# Patient Record
Sex: Female | Born: 1965 | Race: White | Hispanic: No | Marital: Married | Smoking: Never smoker
Health system: Southern US, Community
[De-identification: ages and names within clinical notes are randomized; demographics above are authoritative.]

## PROBLEM LIST (undated history)

## (undated) DIAGNOSIS — K760 Fatty (change of) liver, not elsewhere classified: Secondary | ICD-10-CM

## (undated) DIAGNOSIS — K589 Irritable bowel syndrome without diarrhea: Secondary | ICD-10-CM

## (undated) DIAGNOSIS — E669 Obesity, unspecified: Secondary | ICD-10-CM

## (undated) DIAGNOSIS — M1990 Unspecified osteoarthritis, unspecified site: Secondary | ICD-10-CM

## (undated) DIAGNOSIS — F419 Anxiety disorder, unspecified: Secondary | ICD-10-CM

## (undated) DIAGNOSIS — F32A Depression, unspecified: Secondary | ICD-10-CM

## (undated) DIAGNOSIS — Z803 Family history of malignant neoplasm of breast: Secondary | ICD-10-CM

## (undated) DIAGNOSIS — D649 Anemia, unspecified: Secondary | ICD-10-CM

## (undated) DIAGNOSIS — E119 Type 2 diabetes mellitus without complications: Secondary | ICD-10-CM

## (undated) DIAGNOSIS — I10 Essential (primary) hypertension: Secondary | ICD-10-CM

## (undated) DIAGNOSIS — C642 Malignant neoplasm of left kidney, except renal pelvis: Secondary | ICD-10-CM

## (undated) DIAGNOSIS — N186 End stage renal disease: Secondary | ICD-10-CM

## (undated) DIAGNOSIS — IMO0001 Reserved for inherently not codable concepts without codable children: Secondary | ICD-10-CM

## (undated) DIAGNOSIS — I350 Nonrheumatic aortic (valve) stenosis: Secondary | ICD-10-CM

## (undated) DIAGNOSIS — R197 Diarrhea, unspecified: Secondary | ICD-10-CM

## (undated) DIAGNOSIS — R0609 Other forms of dyspnea: Secondary | ICD-10-CM

## (undated) DIAGNOSIS — R0602 Shortness of breath: Secondary | ICD-10-CM

## (undated) DIAGNOSIS — E8770 Fluid overload, unspecified: Secondary | ICD-10-CM

## (undated) DIAGNOSIS — H269 Unspecified cataract: Secondary | ICD-10-CM

## (undated) DIAGNOSIS — Z992 Dependence on renal dialysis: Secondary | ICD-10-CM

## (undated) DIAGNOSIS — Z79899 Other long term (current) drug therapy: Secondary | ICD-10-CM

## (undated) DIAGNOSIS — R6889 Other general symptoms and signs: Secondary | ICD-10-CM

## (undated) DIAGNOSIS — J189 Pneumonia, unspecified organism: Secondary | ICD-10-CM

## (undated) DIAGNOSIS — H3581 Retinal edema: Secondary | ICD-10-CM

## (undated) DIAGNOSIS — Z87448 Personal history of other diseases of urinary system: Secondary | ICD-10-CM

## (undated) DIAGNOSIS — G8929 Other chronic pain: Secondary | ICD-10-CM

## (undated) DIAGNOSIS — R609 Edema, unspecified: Secondary | ICD-10-CM

## (undated) DIAGNOSIS — G629 Polyneuropathy, unspecified: Secondary | ICD-10-CM

## (undated) DIAGNOSIS — R600 Localized edema: Secondary | ICD-10-CM

## (undated) DIAGNOSIS — F410 Panic disorder [episodic paroxysmal anxiety] without agoraphobia: Secondary | ICD-10-CM

## (undated) DIAGNOSIS — C801 Malignant (primary) neoplasm, unspecified: Secondary | ICD-10-CM

## (undated) DIAGNOSIS — R51 Headache: Secondary | ICD-10-CM

## (undated) DIAGNOSIS — E785 Hyperlipidemia, unspecified: Secondary | ICD-10-CM

## (undated) DIAGNOSIS — E0590 Thyrotoxicosis, unspecified without thyrotoxic crisis or storm: Secondary | ICD-10-CM

## (undated) DIAGNOSIS — K5900 Constipation, unspecified: Secondary | ICD-10-CM

## (undated) DIAGNOSIS — E213 Hyperparathyroidism, unspecified: Secondary | ICD-10-CM

## (undated) DIAGNOSIS — Z973 Presence of spectacles and contact lenses: Secondary | ICD-10-CM

## (undated) DIAGNOSIS — I519 Heart disease, unspecified: Secondary | ICD-10-CM

## (undated) DIAGNOSIS — F329 Major depressive disorder, single episode, unspecified: Secondary | ICD-10-CM

## (undated) DIAGNOSIS — R0600 Dyspnea, unspecified: Secondary | ICD-10-CM

## (undated) DIAGNOSIS — D369 Benign neoplasm, unspecified site: Secondary | ICD-10-CM

## (undated) DIAGNOSIS — L0390 Cellulitis, unspecified: Secondary | ICD-10-CM

## (undated) HISTORY — DX: Depression, unspecified: F32.A

## (undated) HISTORY — PX: BREAST BIOPSY: SHX20

## (undated) HISTORY — DX: Anxiety disorder, unspecified: F41.9

## (undated) HISTORY — DX: Anemia, unspecified: D64.9

## (undated) HISTORY — DX: Family history of malignant neoplasm of breast: Z80.3

## (undated) HISTORY — PX: ABDOMINAL HYSTERECTOMY: SHX81

## (undated) HISTORY — DX: Fatty (change of) liver, not elsewhere classified: K76.0

## (undated) HISTORY — DX: Unspecified osteoarthritis, unspecified site: M19.90

## (undated) HISTORY — DX: Irritable bowel syndrome, unspecified: K58.9

## (undated) HISTORY — DX: Obesity, unspecified: E66.9

## (undated) HISTORY — DX: Type 2 diabetes mellitus without complications: E11.9

## (undated) HISTORY — PX: AV FISTULA PLACEMENT: SHX1204

## (undated) HISTORY — DX: Cellulitis, unspecified: L03.90

## (undated) HISTORY — PX: ABDOMINAL SURGERY: SHX537

## (undated) HISTORY — PX: CHOLECYSTECTOMY: SHX55

## (undated) HISTORY — DX: Reserved for inherently not codable concepts without codable children: IMO0001

## (undated) HISTORY — PX: TUBAL LIGATION: SHX77

## (undated) HISTORY — PX: EYE SURGERY: SHX253

## (undated) HISTORY — DX: Irritable bowel syndrome without diarrhea: K58.9

## (undated) HISTORY — PX: PELVIC LAPAROSCOPY: SHX162

## (undated) HISTORY — DX: Malignant (primary) neoplasm, unspecified: C80.1

## (undated) HISTORY — PX: HX CATARACT REMOVAL: SHX102

## (undated) HISTORY — PX: HYMENECTOMY: SHX987

## (undated) HISTORY — PX: KIDNEY SURGERY: SHX687

---
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
## 1994-09-25 HISTORY — PX: HYMENECTOMY: SHX987

## 1996-09-10 HISTORY — PX: HX GALL BLADDER SURGERY/CHOLE: SHX55

## 1996-09-11 HISTORY — PX: GALLBLADDER SURGERY: SHX652

## 1996-09-11 HISTORY — PX: CHOLECYSTECTOMY: SHX55

## 1997-09-25 HISTORY — PX: ECTOPIC PREGNANCY SURGERY: SHX613

## 1998-05-24 HISTORY — PX: ECTOPIC PREGNANCY SURGERY: SHX613

## 1998-05-24 HISTORY — PX: HX PELVIC LAPAROSCOPY: SHX162

## 1998-05-25 HISTORY — PX: ECTOPIC PREGNANCY SURGERY: SHX613

## 2012-03-07 ENCOUNTER — Ambulatory Visit
Admission: RE | Admit: 2012-03-07 | Disposition: A | Discharge status: Home / Self Care | Payer: Self-pay | Source: Ambulatory Visit

## 2012-09-02 ENCOUNTER — Encounter (HOSPITAL_BASED_OUTPATIENT_CLINIC_OR_DEPARTMENT_OTHER): Payer: Self-pay

## 2012-09-02 ENCOUNTER — Ambulatory Visit (HOSPITAL_BASED_OUTPATIENT_CLINIC_OR_DEPARTMENT_OTHER)
Admission: RE | Admit: 2012-09-02 | Discharge: 2012-09-02 | Disposition: A | Discharge status: Home / Self Care | Payer: MEDICAID | Source: Ambulatory Visit | Attending: Obstetrics & Gynecology | Admitting: Obstetrics & Gynecology

## 2012-09-02 DIAGNOSIS — Z01812 Encounter for preprocedural laboratory examination: Secondary | ICD-10-CM | POA: Insufficient documentation

## 2012-09-02 DIAGNOSIS — Z302 Encounter for sterilization: Secondary | ICD-10-CM | POA: Insufficient documentation

## 2012-09-02 HISTORY — DX: Anxiety disorder, unspecified: F41.9

## 2012-09-02 HISTORY — DX: Irritable bowel syndrome, unspecified: K58.9

## 2012-09-02 HISTORY — DX: Fatty (change of) liver, not elsewhere classified: K76.0

## 2012-09-02 HISTORY — DX: Depression, unspecified: F32.A

## 2012-09-02 HISTORY — DX: Unspecified osteoarthritis, unspecified site: M19.90

## 2012-09-02 HISTORY — DX: Obesity, unspecified: E66.9

## 2012-09-02 LAB — URINALYSIS (ROUTINE) - BMC ONLY
BILIRUBIN,URINE: NEGATIVE mg/dL
GLUCOSE, URINE: >1000 mg/dL — AB
KETONES,URINE: NEGATIVE mg/dL
NITRITES,URINE: NEGATIVE
PH,URINE: 5 (ref <8.0)
SPECIFIC GRAVITY,URINE: 1.02 (ref <1.022)
UROBILINOGEN, URINE: <2 mg/dL (ref <2.0)

## 2012-09-02 LAB — CBC
BASOPHIL #: 0.02 10*3/uL (ref 0.00–0.10)
BASOPHILS %: 0.3 % (ref 0.0–1.4)
EOSINOPHIL #: 0.03 10*3/uL (ref 0.00–0.50)
EOSINOPHIL %: 0.4 % (ref 0.0–5.2)
HCT: 46.3 % — ABNORMAL HIGH (ref 36.0–45.0)
HGB: 16 g/dL — ABNORMAL HIGH (ref 12.0–15.5)
LYMPHOCYTE #: 1.87 10*3/uL (ref 0.70–3.20)
LYMPHOCYTE %: 27.5 % (ref 15.0–43.0)
MCH: 30.5 pg (ref 28.0–34.0)
MCHC: 34.6 g/dL (ref 33.0–37.0)
MCV: 88.1 fL (ref 82.0–97.0)
MONOCYTE #: 0.32 10*3/uL (ref 0.20–0.90)
MONOCYTE %: 4.7 % — ABNORMAL LOW (ref 4.8–12.0)
MPV: 9.3 fL (ref 7.0–9.4)
PLATELET COUNT: 221 10*3/uL (ref 150–400)
PMN #: 4.55 K/uL (ref 1.50–6.50)
PMN %: 67 % (ref 43.0–76.0)
RBC: 5.26 M/uL — ABNORMAL HIGH (ref 4.00–5.10)
RDW: 11.6 % (ref 11.0–13.0)
WBC: 6.8 K/uL (ref 4.0–11.0)

## 2012-09-10 ENCOUNTER — Encounter (HOSPITAL_BASED_OUTPATIENT_CLINIC_OR_DEPARTMENT_OTHER): Payer: Self-pay

## 2012-09-10 ENCOUNTER — Encounter (HOSPITAL_BASED_OUTPATIENT_CLINIC_OR_DEPARTMENT_OTHER)
Admission: RE | Disposition: A | Discharge status: Home / Self Care | Payer: Self-pay | Source: Ambulatory Visit | Attending: Obstetrics & Gynecology

## 2012-09-10 ENCOUNTER — Inpatient Hospital Stay (HOSPITAL_BASED_OUTPATIENT_CLINIC_OR_DEPARTMENT_OTHER)
Admission: RE | Admit: 2012-09-10 | Discharge: 2012-09-10 | Disposition: A | Discharge status: Home / Self Care | Payer: MEDICAID | Source: Ambulatory Visit | Attending: Obstetrics & Gynecology | Admitting: Obstetrics & Gynecology

## 2012-09-10 DIAGNOSIS — K589 Irritable bowel syndrome without diarrhea: Secondary | ICD-10-CM | POA: Insufficient documentation

## 2012-09-10 DIAGNOSIS — F3289 Other specified depressive episodes: Secondary | ICD-10-CM | POA: Insufficient documentation

## 2012-09-10 DIAGNOSIS — F411 Generalized anxiety disorder: Secondary | ICD-10-CM | POA: Insufficient documentation

## 2012-09-10 DIAGNOSIS — K7689 Other specified diseases of liver: Secondary | ICD-10-CM | POA: Insufficient documentation

## 2012-09-10 DIAGNOSIS — E669 Obesity, unspecified: Secondary | ICD-10-CM | POA: Insufficient documentation

## 2012-09-10 HISTORY — PX: TUBAL LIGATION: SHX77

## 2012-09-10 HISTORY — PX: HX TUBAL LIGATION: SHX77

## 2012-09-10 SURGERY — LAPAROSCOPIC OCCLUSION TUBAL
Anesthesia: General | Site: Abdomen | Laterality: Bilateral | Wound class: Clean Contaminated Wounds-The respiratory, GI, Genital, or urinary

## 2012-09-10 MED ORDER — ONDANSETRON HCL (PF) 4 MG/2 ML INJECTION SOLUTION
4.0000 mg | Freq: Once | INTRAMUSCULAR | Status: DC | PRN
Start: 2012-09-10 — End: 2012-09-10

## 2012-09-10 MED ORDER — HYDROMORPHONE 2 MG/ML INJECTION SYRINGE
0.5000 mg | INJECTION | INTRAMUSCULAR | Status: AC | PRN
Start: 2012-09-10 — End: 2012-09-10
  Administered 2012-09-10 (×2): 0.5 mg via INTRAVENOUS
  Filled 2012-09-10: qty 1

## 2012-09-10 MED ORDER — LACTATED RINGERS INTRAVENOUS SOLUTION
INTRAVENOUS | Status: DC
Start: 2012-09-10 — End: 2012-09-10

## 2012-09-10 MED ORDER — ALBUTEROL SULFATE CONCENTRATE 2.5 MG/0.5 ML SOLUTION FOR NEBULIZATION
5.0000 mg | INHALATION_SOLUTION | Freq: Once | RESPIRATORY_TRACT | Status: DC | PRN
Start: 2012-09-10 — End: 2012-09-10

## 2012-09-10 MED ORDER — IPRATROPIUM BROMIDE 0.02 % SOLUTION FOR INHALATION
0.5000 mg | Freq: Once | RESPIRATORY_TRACT | Status: DC | PRN
Start: 2012-09-10 — End: 2012-09-10

## 2012-09-10 MED ORDER — TERCONAZOLE 0.4 % VAGINAL CREAM
TOPICAL_CREAM | VAGINAL | Status: DC
Start: 2012-09-10 — End: 2013-01-22

## 2012-09-10 MED ORDER — ACETAMINOPHEN 1,000 MG/100 ML (10 MG/ML) INTRAVENOUS SOLUTION
1000.0000 mg | Freq: Once | INTRAVENOUS | Status: AC
Start: 2012-09-10 — End: 2012-09-10
  Administered 2012-09-10: 1000 mg via INTRAVENOUS
  Filled 2012-09-10: qty 1

## 2012-09-10 MED ORDER — IBUPROFEN 800 MG TABLET
800.0000 mg | ORAL_TABLET | Freq: Three times a day (TID) | ORAL | Status: DC | PRN
Start: 2012-09-10 — End: 2014-10-27

## 2012-09-10 MED ORDER — MIDAZOLAM 1 MG/ML INJECTION SOLUTION
0.5000 mg | INTRAMUSCULAR | Status: DC | PRN
Start: 2012-09-10 — End: 2012-09-10

## 2012-09-10 MED ORDER — HYDROCODONE 5 MG-ACETAMINOPHEN 325 MG TABLET
1.0000 | ORAL_TABLET | ORAL | Status: DC | PRN
Start: 2012-09-10 — End: 2013-01-22

## 2012-09-10 SURGICAL SUPPLY — 18 items
CATH URETH DOVER RBNL 16FR 16IN 2 STGR DRAIN EYE RND CLS TIP INT FNL CONN PVC STRL LF  DISP (UROLOGICAL SUPPLIES) ×1 IMPLANT
CLIP SM INTERNAL FLSH SIL TI SFT LINE UPR JAW FLLPN TUBE STRL TBL LGT SYS LF (Clip) ×2 IMPLANT
CONV USE ITEM 156524 - ADHESIVE TISSUE EXOFIN 1.0ML_PREMIERPRO EXOFIN (SEALANTS) ×1 IMPLANT
CONV USE ITEM 321837 - GLOVE SURG 7.5 LTX 3 PLY PF BE_AD CUF SMTH STRL BRN TINT (GLOVES AND ACCESSORIES) ×1 IMPLANT
CONV USE ITEM 338068 - KIT SURG SM STUP NONST DISP LF (CUSTOM TRAYS & PACK) ×1 IMPLANT
CONV USE ITEM 58611 - NEEDLE INSFL 120MM 14GA EPTH ULTRAVERS SPRG LD STOPCOCK LL CONN BLUNT STY STRL DISP PNMPRTN CYTO SS (ENDOSCOPIC SUPPLIES) ×1 IMPLANT
GLOVE SURG TRIFLEX LATEX 8.0 2D7255 40PR/BX STRL PWD (GLOVES AND ACCESSORIES) ×1 IMPLANT
GOWN SURG XL AAMI L4 IMPRV REI NF BRTHBL STRL LF DISP CNVRT (PROTECTIVE PRODUCTS/GARMENTS) ×2 IMPLANT
KIT SURG SM STUP NONST DISP LF (CUSTOM TRAYS & PACK) ×1
PACK LAPAROSCOPY 29245 CS/8 (PROTECTIVE PRODUCTS/GARMENTS) ×1 IMPLANT
SOL ANFG DFGR ISOPRPNL PAD OVAL BTL NABRSV ADH STRL LF  DISP (KITS & TRAYS (DISPOSABLE)) ×1 IMPLANT
SUTURE 3-0 SH VICRYL 27IN VIOL BRD COAT ABS (SUTURE/WOUND CLOSURE) ×1 IMPLANT
SUTURE 4-0 PS-4 VICRYL MTPS 18IN UNDYED BRD COAT ABS (SUTURE/WOUND CLOSURE) ×1 IMPLANT
TRAY SKIN SCRUB 8IN VNYL COTTON 6 WNG 6 SPONGE STICK 2 TIP APPL DRY STRL LF (KITS & TRAYS (DISPOSABLE)) ×2 IMPLANT
TROCAR LAPSCP STD 100MM 11MM VERSAONE FIX CANN BLADE STRL LF  DISP (ENDOSCOPIC SUPPLIES) ×1 IMPLANT
TROCAR LAPSCP STD 11MM VRSTP + RADIAL EXPD SLEEVE CANN DIL STRL LF  DISP (ENDOSCOPIC SUPPLIES) ×1 IMPLANT
TUBE PNEUMO HEATED BX/10_0620040690 (Connecting Tubes/Misc) ×1
TUBING INSFL PNEUMOSURE THRMPLST 12.52X5.83IN C19.84LB 150VA 18.7IN 50104F HEAT HI FLOW RATE SET (Connecting Tubes/Misc) ×1 IMPLANT

## 2012-09-10 NOTE — OR Surgeon (Addendum)
[facility]  [place], [place] [address]    Post-Operative Note    Patient Name: [name] Number: [id number]  Encounter number: [id number]  Date of Birth: December 25, 1965  Date of Service: 09/10/2012     Procedure performed: Laparoscopic Tubal Occlusion with Filshie Clips x2  Attending: Dannette Barbara, MD  Assistant: El-Amin, DO (PGY-2)    Pre-Operative Diagnosis: Undesired Future Fertility  Post-Operative Diagnosis:: Same    Findings: Normal appearing uterus, left fallopian tube and bilateral ovaries. Mild adhesions of omentum to anterior abdominal wall. Remnant right fallopian tube    Type of Anesthesia: General  Estimate Blood Loss: Minimal  Fluids given: 1,800 cc of LR  Urine output: 250 cc of clear urine    Specimen's removed: None    Complications: None    Recommendations and Follow-up:   Follow up in 2 weeks    Patient is at increased risk for surgical bleeding:  No    Patient is on postop antibiotic regimen greater than 24 hours:  No    IMPLANTS:  Filshie clips x 2.    DISPOSITION:  Hemodynamically stable to PACU.     DESCRIPTION OF PROCEDURE:  After informed consent was obtained, the patient was taken to the operating room where she was placed in the dorsal supine position. A surgical pause was performed and the patient was identified by anesthesia, nursing, and surgical staff and the proposed procedure agreed upon. Venodynes were placed and pumping prior to induction of anesthesia. After general anesthesia was induced, the patient was prepped and draped in normal sterile fashion in the dorsal lithotomy position. The bladder was drained for clear sterile urine. A bivalve speculum was placed in the vagina and a Hulka uterine manipulator was inserted into the uterus and a clamp placed on the anterior lip of the cervix.  Attention was turned to the laparoscopic portion of procedure in which 0.25% Marcaine was injected in the infraumbilical fold and an incision was made with a scalpel.     A Veress needle was inserted and a normal saline syringe was attached to the needle and with withdrawing no blood or bowel contents. Due to inability to have an appropriate opening pressure, a decision was made to proceed with open laparoscopy. The incision was dissected down to fascia with Metzenbaum scissors and a small incision was made on fascia and peritoneum. The 11 mm trochar was then inserted and the abdomen was insufflated with CO2 gas.  The 10 mm laparoscope was inserted which confirmed intraabdominal placement.  Survey of the abdomen was performed with the above-noted findings.  A second port, 8 mm was placed in the suprapubic area, 2 cm above the pubic symphysis.  The Filshie clip applicator was inserted into the suprapubic trocar and the left tube was grasped with the applicator device and the isthmic portion of the tube and the Filshie clip applied. The tubes were identified by the fimbriated end.  Upon inspection of the clip after application it was noted to be around the tube in its entirety and the mesosalpinx.  The clip applicator was reloaded and reinserted into the suprapubic port, at which time the right tube remnant from previous salpingectomy was grasped and a Filshie clip applied.     The suprapubic port was removed under direct visualization. The anterior abdominal wall was noted to be hemostatic.  The CO2 gas was allowed to escape from the abdomen and the infraumbilical port was removed.  The suprapubic port was closed  with 4-0 Monocryl in a figure-of-eight fashion and Dermabond was used to skin.  The infraumbilical port site fascia was closed with 3.0 vicryl and the skin was closed with 4.0 Vicryl and Dermabond.      All counts were correct at the end of the procedure x2.  The patient tolerated the procedure well and was transported to the recovery room in stable condition.      Dr. Dannette Barbara was present and participated in entire procedure.     Vishnu Moeller El-Amin, DO 09/10/2012, 12:53 PM

## 2012-09-10 NOTE — Progress Notes (Addendum)
[place]  [id number]  09/10/2012    Discharge Note    Discharge patient home when meets criteria.  Follow up with Dr. Dannette Barbara in 2 weeks.  Home with Motrin and Percocet for pain control.    Rawan El-Amin, DO 09/10/2012, 12:43 PM

## 2012-09-10 NOTE — Discharge Instructions (Signed)
[facility]  [place], [place] [address]     Anesthesia Discharge Instructions    1.  Do NOT drive an automobile today.    2.  Do NOT sign any legal documents for 24 hours.    3.  Do NOT operate any electrical equipment today.    Fall risk prevention education has been reviewed with the patient/significant other.

## 2012-09-10 NOTE — Nurses Notes (Signed)
1310 pt to ods post op. Pt drowsy, wakes to voice and is oriented. Lungs cta. Incisions open to air, no drainage noted  1345 pt sleeping off and on. Tolerating po tea and crackers.  1430 no drainage noted, incisions dry and intact. Pain 0/10 per pt.  1500 pt awake alert and oriented. Incisions dry and intact. Scant bloody drainage noted on peripad  1510 written discharge instructions reivewed with pt and pts son both verbalized understanding.  Pt to curbside via wheelchair by this rn for discharge to home via pov

## 2012-09-10 NOTE — Consults (Signed)
[facility]  [place], [place]  [address]    ANESTHESIA PRE-OP EVALUATION    MRN:  [id number]  [name]  46 y.o.  Sex: female     Date of Service: 09/10/2012    Surgeon: Juliann Mule):  Renold Genta, MD    Scheduled Procedure:  Procedure(s):  LAPAROSCOPIC OCCLUSION TUBAL    Diagnosis/Pertinant HPI: steralization     Weight: 103.42 kg (228 lb)  Height: 170.2 cm (5\' 7" )     Filed Vitals:    09/10/12 0946   Pulse: 104   Temp: 36.6 C (97.9 F)   Resp: 20   SpO2: 100%            ALLERGY:  No Known Allergies    Medications Prior to Admission    Outpatient Medications    Loperamide (IMODIUM A-D) 2 mg Oral Tablet    Take by mouth Once per day as needed          Current Facility-Administered Medications:  LR premix infusion  Intravenous Continuous       Past Medical History   Diagnosis Date   . Anxiety    . Depression    . Arthritis    . Irritable bowel syndrome    . Obesity    . Fatty liver        Past Surgical History   Procedure Laterality Date   . Hx gall bladder surgery/chole  09/10/96   . Hx other  1996     Hymenectomy   . Hx pelvic laparoscopy  05/24/98     Ectopic pregnancy       Pregnant: no    Prior Anesthesia Difficulties:  no    Familial Anesthesia Difficulties: no    History   Substance Use Topics   . Smoking status: Never Smoker    . Smokeless tobacco: Not on file   . Alcohol Use: No       Labs:   BMP:      No results found for this basename: sodium, potassium, chloride, CO2, bun, creatinine, GLUCOSECJ, aniongap, gfr, calcium     CBC:    Lab Results   Component Value Date    WBC 6.8 09/02/2012    HGB 16.0* 09/02/2012    HCT 46.3* 09/02/2012    PLTCNT 221 09/02/2012      Platelets:    Lab Results   Component Value Date    PLTCNT 221 09/02/2012     Coags:    No results found for this basename: prothromtme, inr, inrnorm, aptt, ddimerquant, fibrinogen     TSH:   No results found for this basename: TSHCAS, TSH3RDGEN, FT3, freet4     Pregnancy test:    No results found for this basename: PREGNANCURED, hcgj, hcgqualc      Fingerstick glucose:    No results found for this basename: POCGLU, GLUPOCT                   ANESTHESIA DAY OF SURGERY EVALUATION      NPO: After midnight  Airway: I (soft palate, uvula, fauces, tonsillar pillars visible)  Dentition:   post lower cracked    Cardiovascular: regular rate and rhythm   Respiratory: clear to auscultation bilaterally       ASA Status: 2  Proposed Anesthesia: GETA       Pre-Induction Evaluation and informed consent including risks, benefits, and alternatives. Patient was seen and evaluated immediately prior to the induction of anesthesia.

## 2012-09-11 NOTE — Consults (Signed)
[place] EVALUATION NOTE    09/11/2012    Temperature: 36.7 C (98.1 F) (09/10/12 1309)  Heart Rate: 88 (09/10/12 1515)  BP (Non-Invasive): 156/84 mmHg (09/10/12 1515)  Respiratory Rate: 16 (09/10/12 1515)  SpO2-1: 97 % (09/10/12 1515)  Pain Score (Numeric, Faces): 0 (09/10/12 1515)    Patient should be sufficiently recovered from the effects of anesthesia to partcipate in the evaluation or has returned to their pre-procedure level.    I have reviewed and evaluated the following:  Respiratory Function:  Consistent with pre anesthetic level  Cardiovascular Function:  Consistent with pre anesthetic level  Mental Status:  Return to pre anesthetic baseline level  Pain:  Sufficiently controlled with medication  Nausea and Vomiting:  Absent or sufficiently controlled with medication    Post operative complications: None  Comment/ re-evaluation for any variations:  none    Jacqualine Mau, CRNA, CRNA 09/11/2012, 4:00 PM

## 2013-01-22 ENCOUNTER — Emergency Department (HOSPITAL_BASED_OUTPATIENT_CLINIC_OR_DEPARTMENT_OTHER)
Admission: EM | Admit: 2013-01-22 | Discharge: 2013-01-22 | Disposition: A | Discharge status: Home / Self Care | Payer: MEDICAID | Attending: Emergency Medicine | Admitting: Emergency Medicine

## 2013-01-22 ENCOUNTER — Emergency Department (HOSPITAL_BASED_OUTPATIENT_CLINIC_OR_DEPARTMENT_OTHER): Payer: MEDICAID

## 2013-01-22 ENCOUNTER — Encounter (HOSPITAL_BASED_OUTPATIENT_CLINIC_OR_DEPARTMENT_OTHER): Payer: Self-pay

## 2013-01-22 DIAGNOSIS — E669 Obesity, unspecified: Secondary | ICD-10-CM | POA: Insufficient documentation

## 2013-01-22 DIAGNOSIS — R7309 Other abnormal glucose: Secondary | ICD-10-CM | POA: Insufficient documentation

## 2013-01-22 DIAGNOSIS — K7689 Other specified diseases of liver: Secondary | ICD-10-CM | POA: Insufficient documentation

## 2013-01-22 DIAGNOSIS — K589 Irritable bowel syndrome without diarrhea: Secondary | ICD-10-CM | POA: Insufficient documentation

## 2013-01-22 DIAGNOSIS — I10 Essential (primary) hypertension: Secondary | ICD-10-CM | POA: Insufficient documentation

## 2013-01-22 DIAGNOSIS — F411 Generalized anxiety disorder: Secondary | ICD-10-CM | POA: Insufficient documentation

## 2013-01-22 HISTORY — DX: Reserved for inherently not codable concepts without codable children: IMO0001

## 2013-01-22 LAB — CBC
BASOPHIL #: 0.09 K/uL (ref 0.00–0.10)
BASOPHILS %: 1.1 % (ref 0.0–1.4)
EOSINOPHIL #: 0.01 10*3/uL (ref 0.00–0.50)
EOSINOPHIL %: 0.2 % (ref 0.0–5.2)
HCT: 45.3 % — ABNORMAL HIGH (ref 36.0–45.0)
HGB: 16.4 g/dL — ABNORMAL HIGH (ref 12.0–15.5)
LYMPHOCYTE #: 1.42 K/uL (ref 0.70–3.20)
LYMPHOCYTE %: 19 % (ref 15.0–43.0)
MCH: 31.4 pg (ref 28.0–34.0)
MCHC: 36.2 g/dL (ref 33.0–37.0)
MCV: 86.7 fL (ref 82.0–97.0)
MONOCYTE #: 0.31 10*3/uL (ref 0.20–0.90)
MONOCYTE %: 4.2 % — ABNORMAL LOW (ref 4.8–12.0)
MPV: 8.9 fL (ref 7.0–9.4)
PLATELET COUNT: 255 K/uL (ref 150–400)
PMN #: 5.66 K/uL (ref 1.50–6.50)
PMN %: 75.5 % (ref 43.0–76.0)
RBC: 5.22 M/uL — ABNORMAL HIGH (ref 4.00–5.10)
RDW: 12 % (ref 11.0–13.0)
WBC: 7.5 10*3/uL (ref 4.0–11.0)

## 2013-01-22 LAB — COMPREHENSIVE METABOLIC PROFILE - BMC/JMC ONLY
ALBUMIN: 3.5 g/dL (ref 3.2–5.0)
ALKALINE PHOSPHATASE: 66 IU/L (ref 35–120)
ALT (SGPT): 40 IU/L (ref 0–55)
AST (SGOT): 28 IU/L (ref 0–45)
BILIRUBIN, TOTAL: 0.8 mg/dL (ref 0.0–1.3)
BUN: 8 mg/dL (ref 6–22)
CALCIUM: 9.7 mg/dL (ref 8.5–10.5)
CARBON DIOXIDE: 25 mmol/L (ref 22–32)
CHLORIDE: 100 mmol/L — ABNORMAL LOW (ref 101–111)
CREATININE: 0.63 mg/dL (ref 0.53–1.00)
ESTIMATED GLOMERULAR FILTRATION RATE: >60 mL/min (ref >60)
GLUCOSE: 315 mg/dL — ABNORMAL HIGH (ref 70–110)
POTASSIUM: 4.1 mmol/L (ref 3.5–5.0)
SODIUM: 135 mmol/L — ABNORMAL LOW (ref 136–145)
TOTAL PROTEIN: 6.4 g/dL (ref 6.0–8.0)

## 2013-01-22 LAB — CREATINE KINASE (CK), TOTAL, SERUM: CREATINE KINASE (CK): 31 IU/L (ref 0–230)

## 2013-01-22 LAB — CREATINE KINASE (CK), MB FRACTION, SERUM: CK-MB: 1.5 ng/mL (ref 0.0–6.3)

## 2013-01-22 LAB — TROPONIN-I: TROPONIN-I: <0.03 ng/mL (ref 0.00–0.06)

## 2013-01-22 MED ORDER — ALPRAZOLAM 0.25 MG TABLET
0.25 mg | ORAL_TABLET | Freq: Every evening | ORAL | Status: DC | PRN
Start: 2013-01-22 — End: 2016-06-16

## 2013-01-22 MED ORDER — ALPRAZOLAM 0.5 MG TABLET
0.25 mg | ORAL_TABLET | Freq: Every evening | ORAL | Status: DC | PRN
Start: 2013-01-22 — End: 2013-01-22

## 2013-01-22 MED ORDER — ALPRAZOLAM 0.5 MG TABLET
0.25 mg | ORAL_TABLET | ORAL | Status: AC
Start: 2013-01-22 — End: 2013-01-22
  Administered 2013-01-22: 0.25 mg via ORAL
  Filled 2013-01-22: qty 1

## 2013-01-22 MED ADMIN — ioversoL 350 mg iodine/mL intravenous solution: 80 mL | INTRAVENOUS | NDC 00019133311

## 2013-01-22 MED FILL — ioversoL 350 mg iodine/mL intravenous solution: 100.0000 mL | INTRAVENOUS | Qty: 100 | Status: AC

## 2013-01-22 NOTE — ED Nurses Note (Addendum)
Left side rib pain radiates to back started last Thursday went away for 2 days then came back, denies n/v fevers or SOB. Patient tearful in triage concerned could be her heart but also under a lot of stress at home. Family problems.

## 2013-01-22 NOTE — ED Nurses Note (Signed)
 Patient discharged home with family.  AVS reviewed with patient/care giver.  A written copy of the AVS and discharge instructions was given to the patient/care giver.  Questions sufficiently answered as needed.  Patient/care giver encouraged to follow up with PCP as indicated. Prescription given for Ativan.

## 2013-01-22 NOTE — ED Nurses Note (Signed)
Pt shared that she stopped taking her Zoloft approx 10 years ago because she states "it wasn't working." She shared that she experienced an ectopic pregnancy in 1999 that she is still having a hard time coping with and having some difficulty with her parents.  I encouraged her to establish a PCP so she could be referred to a counselor to help her learn to cope with these stressors.  She was very appreciative and appropriate.  Report given to [employer].

## 2013-01-22 NOTE — Discharge Instructions (Signed)
Anxiety and Panic Attacks    Your caregiver has informed you that you are having an anxiety or panic attack. There may be many forms of this. Most of the time these attacks come suddenly and without warning. They come at any time of day, including periods of sleep, and at any time of life. They are may be strong and unexplained. Sometimes we do not know what is producing our anxiety and it gets out of control. It is usually this anxiety that brings us to the emergency room with hyperventilation syndrome, panic attack, or an sudden attack of anxiety.     Anxiety is a protective mechanism of the body in its fight or flight mechanism. In modern society most of these danger situations we perceive are actually nonphysical. Anxiety over losing a job for example may be protective in that it gives an added push to search for a new job.    It is not difficult for a person to recognize when they are having a panic attack. Some of the most common  feelings are:  Ø Intense terror  Ø Dizziness, feeling faint  Ø Hot and cold flashes  Ø Fear of going crazy  Ø Feelings that nothing is real  Ø Sweating, trembling Ø Chest pain and palpitations (irregular heart)  Ø Shaking  Ø Smothering, choking sensations (feelings)  Ø Feelings of impending doom and that death is near  Ø Tingling of extremities (arms/hands and legs/feet), this may be from over breathing     All of these most common symptoms (problems) and can combine to make up panic attacks. Few things trigger as much fear as this symptom complex (collection of problems) does.     These attacks have many names with slight differences. For example: obsessive-compulsive disorders where repeating the compulsion again removes the anxiety temporarily; post traumatic stress disorders where a previous trauma causes the repeat bouts of anxiety; phobias or fear associated with objects, situations etc.; generalized anxiety disorders.    Today you may have been given an anti-anxiety agent.  This may make you drowsy, but will remove the feelings of panic and anxiety. If these symptoms return, see your caregiver or return to this location.    Remember - some of these feelings to lesser degrees may be just plain, good old fashioned, entirely appropriate worry. The good news is, “you are not crazy,” nor are you headed that way.  There are medications to treat you and your caregiver can and will help you through this. With help, you will soon feel normal again.    Other treatments that may help are therapy and exercise. If you know the cause of your anxiety, removal or avoidance of that cause will help.      [employer]® Patient Information ©2007 [employer].

## 2013-01-22 NOTE — ED Provider Notes (Addendum)
[facility]  Emergency Department Visit Note    Date:  01/22/2013  Primary care provider:  No Established Pcp  Means of arrival:  private car  History obtained from: patient  History limited by: none    Chief Complaint:  Rib pain     HISTORY OF PRESENT ILLNESS     [name], date of birth 1966-09-25, is a 47 y.o. female who presents to the Emergency Department complaining of intermittent rib pain that began 6 days ago. The patient states pain began suddenly. She adds the pain was constant for two days, resolved over the weekend, and returned Sunday night. She states "I did have a good weekend, I got away from the regular daily routine. But then I had to face the week again and the pain returned." The patient adds she has recently had new daily stressors. She reports she talks to a lady from church, who suggested this may be anxiety, and her husband regarding this new stress. She localizes the pain to her left upper rib region. She describes it as "a heaviness, like if someone were to tell you a family member passed away, heavy, like a burden." She reports pain is not affected by exertion but is worse when thinking about stressors. She denies chest pain, extremity swelling or numbness, shortness of breath, nausea, diaphoresis, and abdominal pain.     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 All other systems reviewed and are negative.     PATIENT HISTORY     Past Medical History:  Past Medical History   Diagnosis Date   . Anxiety    . Depression    . Arthritis    . Irritable bowel syndrome    . Obesity    . Fatty liver    . White coat hypertension        Past Surgical History:  Past Surgical History   Procedure Laterality Date   . Hx gall bladder surgery/chole  09/10/96   . Hx other  1996     Hymenectomy    . Hx pelvic laparoscopy  05/24/98     Ectopic pregnancy   . Hx tubal ligation  09/10/2012     Filshie Clips       Family History:  No history of acute illness given at this time.     Social History:  History   Substance Use Topics   . Smoking status: Never Smoker    . Smokeless tobacco: Not on file   . Alcohol Use: No     History   Drug Use No       Medications:  Previous Medications    IBUPROFEN (MOTRIN) 800 MG ORAL TABLET    Take 1 Tab (800 mg total) by mouth Three times a day as needed for Pain    LOPERAMIDE (IMODIUM A-D) 2 MG ORAL TABLET    Take by mouth Once per day as needed    LORATADINE (CLARITIN) 10 MG ORAL TABLET    Take 10 mg by mouth Once a day       Allergies:  No Known Allergies    PHYSICAL EXAM     Vitals:  Filed Vitals:    01/22/13 1052   BP: 201/122   Pulse: 122  Temp: 36.8 C (98.3 F)   Resp: 24   SpO2: 100%       Pulse ox  100% on None (Room Air) interpreted by me as: Normal    General: Awake. Alert. No acute distress.  Head:  Atraumatic     Eyes: Anicteric sclera, noninjected conjunctiva.  PERRL.  EOMI.  ENT: Oropharynx patent, pink, moist, no erythema, exudates or asymmetry. No stridor.   Neck: Neck is supple, nontender, no adenopathy. No nuchal rigidity. No JVD.    Lungs: Clear to auscultation bilaterally. No wheezes, rales or rhonchi.  No respiratory distress.  Cardiovascular:  Heart is regular without murmurs rubs or gallops   Abdomen:  Soft, nontender, normoactive bowel sounds. No peritoneal signs. No pulsatile mass.  Extremities:  No cyanosis, edema, tenderness or asymmetry.  Normal radial and DP pulses bilaterally.   Skin: Skin warm, dry and well-perfused. No petechia or erythema.   Back:  Non-tender to palpation in the midline.    Neurologic:Awake alert no lethargy somnolence confusion. CN II-VII are intact.  Normal finger to nose testing, No pronator drift.  Moves upper and lower extremities without focal deficits or ataxia.  Psychiatric: Answers questions appropriately.      DIAGNOSTIC STUDIES     Labs:    Results for orders placed during the hospital encounter of 01/22/13   CBC       Result Value Range    WBC 7.5  4.0 - 11.0 K/uL    RBC 5.22 (*) 4.00 - 5.10 M/uL    HGB 16.4 (*) 12.0 - 15.5 g/dL    HCT 45.3 (*) 36.0 - 45.0 %    MCV 86.7  82.0 - 97.0 fL    MCH 31.4  28.0 - 34.0 pg    MCHC 36.2  33.0 - 37.0 g/dL    RDW 12.0  11.0 - 13.0 %    PLATELET COUNT 255  150 - 400 K/uL    MPV 8.9  7.0 - 9.4 fL    PMN % 75.5  43.0 - 76.0 %    LYMPHOCYTE % 19.0  15.0 - 43.0 %    MONOCYTE % 4.2 (*) 4.8 - 12.0 %    EOSINOPHIL % 0.2  0.0 - 5.2 %    BASOPHILS % 1.1  0.0 - 1.4 %    PMN # 5.66  1.50 - 6.50 K/uL    LYMPHOCYTE # 1.42  0.70 - 3.20 K/uL    MONOCYTE # 0.31  0.20 - 0.90 K/uL    EOSINOPHIL # 0.01  0.00 - 0.50 K/uL    BASOPHIL # 0.09  0.00 - 0.10 K/uL   CREATINE KINASE (CK), MB FRACTION, SERUM       Result Value Range    CK-MB 1.5  0.0 - 6.3 ng/mL   COMPREHENSIVE METABOLIC PROFILE - [facility] ONLY       Result Value Range    GLUCOSE 315 (*) 70 - 110 mg/dL    BUN 8  6 - 22 mg/dL    CREATININE 0.63  0.53 - 1.00 mg/dL    ESTIMATED GLOMERULAR FILTRATION RATE >60  >60 ml/min    SODIUM 135 (*) 136 - 145 mmol/L    POTASSIUM 4.1  3.5 - 5.0 mmol/L    CHLORIDE 100 (*) 101 - 111 mmol/L    CARBON DIOXIDE 25  22 - 32 mmol/L    CALCIUM 9.7  8.5 - 10.5 mg/dL    TOTAL PROTEIN 6.4  6.0 - 8.0 g/dL  ALBUMIN 3.5  3.2 - 5.0 g/dL    BILIRUBIN, TOTAL 0.8  0.0 - 1.3 mg/dL    AST (SGOT) 28  0 - 45 IU/L    ALT (SGPT) 40  0 - 55 IU/L    ALKALINE PHOSPHATASE 66  35 - 120 IU/L   CREATINE KINASE (CK), TOTAL, SERUM       Result Value Range    CREATINE KINASE (CK) 31  0 - 230 IU/L   TROPONIN-I       Result Value Range    TROPONIN-I <0.03  0.00 - 0.06 ng/mL     Labs reviewed and interpreted by me.    Radiology:    XR CHEST AP PORTABLE: Negative normal   Radiological imaging interpreted by radiologist and independently reviewed by me.    CT ANGIO CHEST FOR PULMONARY EMBOLUS: Negative normal    Radiological study interpreted by radiologist and independently reviewed by me.      EKG:  12 lead EKG interpreted by me shows sinus tachycardia rhythm, rate of 106 bpm, no acute ischemic changes.     ED PROGRESS NOTE / [place] records reviewed by me:  I have reviewed the patient's relevant previous records.       Orders Placed This Encounter   . XR CHEST AP PORTABLE   . CT ANGIO CHEST FOR PULMONARY EMBOLUS   . CBC   . CREATINE KINASE (CK) MB ISOENZYME   . COMPREHENSIVE METABOLIC PROFILE - [facility] ONLY   . CREATINE KINASE (CK), TOTAL   . TROPONIN-I   . ECG 12-LEAD   . INSERT & MAINTAIN PERIPHERAL IV ACCESS   . ALPRAZolam Duanne Moron) tablet   . ioversol (OPTIRAY 350) infusion     Patient was initially treated with Xanax PO. X-ray, EKG, and laboratory testing were ordered.    1:34 PM - The patient states medication is helping. I discussed plan to pursue a chest CT, she is agreeable.      3:25 PM - Currently heart rate of 108 with O2 sat of 100%. Patient was dressed and sitting at the edge of the bed. She stated that the anxiety medication helped a lot but she stated she feels she needs to calm down a bit more. She is denying any other current symptoms. Her symptoms were clearly related to thinking about stressful issues such as her busy schedule and in fact were not related to physical exertion, strongly suggests that her symptoms are related to stress and anxiety rather than underlying cardiac condition. With her tachycardia, CT PA was performed and was negative. Offered further testing in the hospital, but the patient felt comfortable with and was requesting discharge. A prescription for anxiety medication was given. I had a detailed conversation with the patient about the risks and benefits of benzodiazepine medications and that there is a risk for sedation and addiction and that she cannot take alcohol or drive with this medication. I discussed with her about her BP being elevated, but it has improved during her stay.  Her glucose was elevated and she may have undiagnosed diabetes. She notes that she does not have a primary doctor and she understands that she needs one.  The patient indicated understanding of the discussion. The Emergency Department impression and any pertinent test results were discussed in detail. The patient is currently stable for discharge.  All questions and concerns were answered to their satisfaction and they agree with the plan.  Indications for immediate  return to the emergency department and the importance of timely follow up were discussed.    Pre-Disposition Vitals:  Filed Vitals:    01/22/13 1200 01/22/13 1215 01/22/13 1230 01/22/13 1300   BP: 163/102 172/104 150/81 143/88   Pulse: 109 109 106 105   Temp:       Resp: 19 20 20 20    SpO2: 96% 97% 95% 95%       CLINICAL IMPRESSION     1. Anxiety   2. Hyperglycemia   3. HTN    DISPOSITION/PLAN     Discharged         Prescriptions:     New Prescriptions    ALPRAZOLAM (XANAX) 0.25 MG ORAL TABLET    Take 1 Tab (0.25 mg total) by mouth Every night as needed for Anxiety     Follow-Up:     [address]  [place] [address]  [phone]  In 4 days      Condition at Disposition: Stable        SCRIBE [place] scribed for Johna Sheriff, DO on 01/22/2013 at 11:05 AM.     Documentation assistance provided for Johna Sheriff, DO  by Wallace Keller, SCRIBE. Information recorded by the scribe was done at my direction and has been reviewed and validated by me Johna Sheriff, DO.        01/23/2013 10:45 AM  Called patient to see how she was doing.  She states that she was feeling a lot better and slept very well last night.  Discussed with her about her blood sugar was elevated and that she needed to follow up with a PCP even if she was feeling better.   Johna Sheriff, DO 01/23/2013, 10:47 AM

## 2013-01-22 NOTE — ED Nurses Note (Addendum)
12 lead EKG show to Dr. Geoffery Lyons.

## 2014-09-25 HISTORY — PX: CATARACT EXTRACTION: SUR2

## 2014-09-25 HISTORY — PX: HX CATARACT REMOVAL: SHX102

## 2014-10-24 ENCOUNTER — Inpatient Hospital Stay (HOSPITAL_BASED_OUTPATIENT_CLINIC_OR_DEPARTMENT_OTHER)
Admission: EM | Admit: 2014-10-24 | Discharge: 2014-10-27 | DRG: 623 | Disposition: A | Discharge status: Home / Self Care | Payer: No Typology Code available for payment source | Attending: Family Medicine | Admitting: Family Medicine

## 2014-10-24 ENCOUNTER — Encounter (HOSPITAL_BASED_OUTPATIENT_CLINIC_OR_DEPARTMENT_OTHER): Payer: Self-pay

## 2014-10-24 ENCOUNTER — Emergency Department (HOSPITAL_BASED_OUTPATIENT_CLINIC_OR_DEPARTMENT_OTHER): Payer: No Typology Code available for payment source

## 2014-10-24 DIAGNOSIS — E11628 Type 2 diabetes mellitus with other skin complications: Secondary | ICD-10-CM | POA: Insufficient documentation

## 2014-10-24 DIAGNOSIS — L089 Local infection of the skin and subcutaneous tissue, unspecified: Secondary | ICD-10-CM | POA: Insufficient documentation

## 2014-10-24 DIAGNOSIS — I10 Essential (primary) hypertension: Secondary | ICD-10-CM | POA: Diagnosis present

## 2014-10-24 DIAGNOSIS — F329 Major depressive disorder, single episode, unspecified: Secondary | ICD-10-CM | POA: Diagnosis present

## 2014-10-24 DIAGNOSIS — Z6835 Body mass index (BMI) 35.0-35.9, adult: Secondary | ICD-10-CM

## 2014-10-24 DIAGNOSIS — F419 Anxiety disorder, unspecified: Secondary | ICD-10-CM | POA: Diagnosis present

## 2014-10-24 DIAGNOSIS — E119 Type 2 diabetes mellitus without complications: Secondary | ICD-10-CM | POA: Diagnosis present

## 2014-10-24 DIAGNOSIS — E669 Obesity, unspecified: Secondary | ICD-10-CM | POA: Diagnosis present

## 2014-10-24 DIAGNOSIS — L03116 Cellulitis of left lower limb: Secondary | ICD-10-CM | POA: Diagnosis present

## 2014-10-24 DIAGNOSIS — K589 Irritable bowel syndrome without diarrhea: Secondary | ICD-10-CM | POA: Diagnosis present

## 2014-10-24 DIAGNOSIS — L0390 Cellulitis, unspecified: Secondary | ICD-10-CM | POA: Diagnosis present

## 2014-10-24 HISTORY — DX: Type 2 diabetes mellitus without complications: E11.9

## 2014-10-24 HISTORY — DX: Cellulitis, unspecified: L03.90

## 2014-10-24 HISTORY — DX: Retinal edema: H35.81

## 2014-10-24 HISTORY — DX: Presence of spectacles and contact lenses: Z97.3

## 2014-10-24 HISTORY — DX: Headache: R51

## 2014-10-24 LAB — PERFORM POC FINGERSTICK GLUCOSE
BLD GLUCOSE POCT: 92 mg/dL (ref 60–100)
BLD GLUCOSE POCT: 92 mg/dL (ref 60–100)
BLD GLUCOSE POCT: 99 mg/dL (ref 60–100)

## 2014-10-24 MED ORDER — VANCOMYCIN 1 GRAM/200 ML IN DEXTROSE 5 % INTRAVENOUS PIGGYBACK
1000.00 mg | INJECTION | INTRAVENOUS | Status: AC
Start: 2014-10-24 — End: 2014-10-24
  Administered 2014-10-24: 1000 mg via INTRAVENOUS
  Filled 2014-10-24: qty 200

## 2014-10-24 MED ORDER — GADOTERIDOL 279.3 MG/ML INTRAVENOUS SOLUTION
20.00 mL | INTRAVENOUS | Status: AC
Start: 2014-10-24 — End: 2014-10-24
  Administered 2014-10-24: 20 mL via INTRAVENOUS
  Filled 2014-10-24: qty 20

## 2014-10-24 MED ORDER — HYDROMORPHONE 1 MG/ML INJECTION WRAPPER
0.20 mg | INJECTION | INTRAMUSCULAR | Status: DC | PRN
Start: 2014-10-24 — End: 2014-10-27
  Filled 2014-10-24: qty 1

## 2014-10-24 MED ORDER — SODIUM CHLORIDE 0.9 % (FLUSH) INJECTION SYRINGE
10.0000 mL | INJECTION | Freq: Three times a day (TID) | INTRAMUSCULAR | Status: DC
Start: 2014-10-24 — End: 2014-10-27
  Administered 2014-10-24 – 2014-10-25 (×3): 10 mL via INTRAVENOUS
  Administered 2014-10-25: 0 mL via INTRAVENOUS
  Administered 2014-10-25: 10 mL via INTRAVENOUS
  Administered 2014-10-26: 0 mL via INTRAVENOUS
  Administered 2014-10-26 (×2): 10 mL via INTRAVENOUS
  Administered 2014-10-27: 0 mL via INTRAVENOUS
  Administered 2014-10-27: 10 mL via INTRAVENOUS

## 2014-10-24 MED ORDER — PIPERACILLIN-TAZOBACTAM 3.375 GRAM/50 ML DEXTROSE(ISO-OS) IV PIGGYBACK
3.3750 g | INJECTION | INTRAVENOUS | Status: AC
Start: 2014-10-24 — End: 2014-10-24
  Administered 2014-10-24: 3.375 g via INTRAVENOUS
  Administered 2014-10-24: 0 g via INTRAVENOUS
  Filled 2014-10-24: qty 50

## 2014-10-24 MED ORDER — PIPERACILLIN-TAZOBACTAM 3.375 GRAM/50 ML DEXTROSE(ISO-OS) IV PIGGYBACK
3.38 g | INJECTION | Freq: Four times a day (QID) | INTRAVENOUS | Status: DC
Start: 2014-10-24 — End: 2014-10-27
  Administered 2014-10-24 – 2014-10-27 (×12): 3.375 g via INTRAVENOUS
  Filled 2014-10-24 (×17): qty 50

## 2014-10-24 MED ORDER — ALPRAZOLAM 0.25 MG TABLET
0.25 mg | ORAL_TABLET | Freq: Every evening | ORAL | Status: DC | PRN
Start: 2014-10-24 — End: 2014-10-27

## 2014-10-24 MED ORDER — ENOXAPARIN 40 MG/0.4 ML SUB-Q SYRINGE - EAST
40.0000 mg | INJECTION | Freq: Every day | SUBCUTANEOUS | Status: DC
Start: 2014-10-24 — End: 2014-10-27
  Administered 2014-10-24: 40 mg via SUBCUTANEOUS
  Administered 2014-10-26 – 2014-10-27 (×2): 0 mg via SUBCUTANEOUS
  Filled 2014-10-24: qty 0.4
  Filled 2014-10-24 (×2): qty 0

## 2014-10-24 MED ORDER — SODIUM CHLORIDE 0.9 % (FLUSH) INJECTION SYRINGE
10.0000 mL | INJECTION | INTRAMUSCULAR | Status: DC | PRN
Start: 2014-10-24 — End: 2014-10-24

## 2014-10-24 MED ORDER — GLIPIZIDE 10 MG TABLET
5.00 mg | ORAL_TABLET | Freq: Two times a day (BID) | ORAL | Status: DC
Start: 2014-10-24 — End: 2014-10-27
  Administered 2014-10-24 – 2014-10-27 (×6): 5 mg via ORAL
  Filled 2014-10-24 (×6): qty 1

## 2014-10-24 MED ORDER — HYDROMORPHONE 1 MG/ML INJECTION WRAPPER
1.00 mg | INJECTION | INTRAMUSCULAR | Status: AC
Start: 2014-10-24 — End: 2014-10-24
  Administered 2014-10-24: 1 mg via INTRAVENOUS
  Filled 2014-10-24 (×2): qty 1

## 2014-10-24 MED ORDER — NITROGLYCERIN 0.4 MG SUBLINGUAL TABLET
0.40 mg | SUBLINGUAL_TABLET | SUBLINGUAL | Status: DC | PRN
Start: 2014-10-24 — End: 2014-10-25

## 2014-10-24 MED ORDER — SODIUM CHLORIDE 0.9 % (FLUSH) INJECTION SYRINGE
10.00 mL | INJECTION | INTRAMUSCULAR | Status: DC | PRN
Start: 2014-10-24 — End: 2014-10-24

## 2014-10-24 MED ORDER — SODIUM CHLORIDE 0.9 % (FLUSH) INJECTION SYRINGE
10.00 mL | INJECTION | Freq: Three times a day (TID) | INTRAMUSCULAR | Status: DC
Start: 2014-10-24 — End: 2014-10-24

## 2014-10-24 MED ORDER — SODIUM CHLORIDE 0.9 % IV BOLUS
1000.00 mL | INJECTION | Status: AC
Start: 2014-10-24 — End: 2014-10-24
  Administered 2014-10-24: 1000 mL via INTRAVENOUS
  Administered 2014-10-24: 0 mL via INTRAVENOUS

## 2014-10-24 MED ORDER — LOSARTAN 25 MG TABLET
50.0000 mg | ORAL_TABLET | Freq: Every day | ORAL | Status: DC
Start: 2014-10-24 — End: 2014-10-27
  Administered 2014-10-24 – 2014-10-27 (×4): 50 mg via ORAL
  Filled 2014-10-24 (×5): qty 2

## 2014-10-24 MED ORDER — VANCOMYCIN 1,000 MG (50 MG/ML) INTRAVENOUS INJECTION
1500.00 mg | Freq: Two times a day (BID) | INTRAVENOUS | Status: DC
Start: 2014-10-25 — End: 2014-10-26
  Administered 2014-10-25 (×2): 1500 mg via INTRAVENOUS
  Filled 2014-10-24 (×4): qty 30

## 2014-10-24 MED ORDER — ONDANSETRON HCL (PF) 4 MG/2 ML INJECTION SOLUTION
4.00 mg | Freq: Three times a day (TID) | INTRAMUSCULAR | Status: DC | PRN
Start: 2014-10-24 — End: 2014-10-27

## 2014-10-24 MED ORDER — HYDROCHLOROTHIAZIDE 12.5 MG TABLET
12.50 mg | ORAL_TABLET | Freq: Every day | ORAL | Status: DC
Start: 2014-10-24 — End: 2014-10-27
  Administered 2014-10-24 – 2014-10-27 (×4): 12.5 mg via ORAL
  Filled 2014-10-24 (×4): qty 1

## 2014-10-24 MED ORDER — PANTOPRAZOLE 40 MG TABLET,DELAYED RELEASE
40.00 mg | DELAYED_RELEASE_TABLET | Freq: Every day | ORAL | Status: DC
Start: 2014-10-24 — End: 2014-10-27
  Administered 2014-10-24: 0 mg via ORAL
  Administered 2014-10-25 – 2014-10-27 (×3): 40 mg via ORAL
  Filled 2014-10-24 (×3): qty 1

## 2014-10-24 MED ORDER — LOSARTAN 50 MG-HYDROCHLOROTHIAZIDE 12.5 MG TABLET
1.0000 | ORAL_TABLET | Freq: Every day | ORAL | Status: DC
Start: 2014-10-24 — End: 2014-10-24

## 2014-10-24 MED ORDER — PHARMACY VANCOMYCIN AND/OR AMINOGLYCOSIDE CONSULT - EAST
Freq: Once | Status: DC | PRN
Start: 2014-10-24 — End: 2014-10-27
  Filled 2014-10-24: qty 1

## 2014-10-24 MED ADMIN — hydrocortisone 1 %-iodoquinoL 1 % topical cream: ORAL | @ 15:00:00 | NDC 45802093064

## 2014-10-24 MED ADMIN — lidocaine (PF) 10 mg/mL (1 %) injection solution: INTRAVENOUS | @ 18:00:00

## 2014-10-24 NOTE — H&P (Signed)
[facility]  [place], [place] [address]    General History and Physical    May,[name]  Date of Admission:  10/24/2014  Date of Birth:  19-Dec-1965    PCP: Candee Furbish, MD  Chief Complaint:  Left foot pain       HPI: [name] is a 49 y.o., White female with DM type 2  presents with Left foot pain 8/10 sharp since 3 weeks. She removed a splinter from her foot 3-4 weeks ago and subsequently the pain began and worsened was associated with redness and swelling. No fever no discharge, was prescribed clindamycin 10 days ago which was changed to Keflex 4 days ago. She took both antibiotics as prescribed. However she did not feel any relief. No nausea no vomiting no diarrhea.  Menstrual cramps present.    Active Hospital Problems   (*Primary Problem)    Diagnosis    Cellulitis       Past Medical History   Diagnosis Date    Anxiety     Depression     Arthritis     Irritable bowel syndrome     Obesity     Fatty liver     White coat hypertension     Diabetes mellitus     Cellulitis 10/24/14     left foot           Past Surgical History   Procedure Laterality Date    Hx gall bladder surgery/chole  09/10/96    Hx other  1996     Hymenectomy    Hx pelvic laparoscopy  05/24/98     Ectopic pregnancy    Hx tubal ligation  09/10/2012     Filshie Clips           Medications Prior to Admission     Prescriptions    ALPRAZolam (XANAX) 0.25 mg Oral Tablet    Take 1 Tab (0.25 mg total) by mouth Every night as needed for Anxiety    Patient taking differently:  Take 0.25 mg by mouth Twice per day as needed for Anxiety     cephalexin (KEFLEX) 500 mg Oral Capsule    Take 500 mg by mouth Three times a day    glipiZIDE (GLUCOTROL) 5 mg Oral Tablet    Take 5 mg by mouth Twice a day before meals Take 30 minutes before meals    Ibuprofen (MOTRIN) 800 mg Oral Tablet    Take 1 Tab (800 mg total) by mouth Three times a day as needed for Pain    ketoconazole (NIZORAL) 2 % Cream    Apply topically Twice  daily    Loperamide (IMODIUM A-D) 2 mg Oral Tablet    Take by mouth Once per day as needed    loratadine (CLARITIN) 10 mg Oral Tablet    Take 10 mg by mouth Once per day as needed     Losartan-Hydrochlorothiazide 50-12.5 mg Oral Tablet    Take 1 Tab by mouth Once a day          Current Facility-Administered Medications:  ALPRAZolam (XANAX) tablet 0.25 mg Oral HS PRN   enoxaparin (LOVENOX) 40 mg/0.4 mL SubQ injection 40 mg Subcutaneous Daily   glipiZIDE (GLUCOTROL) tablet 5 mg Oral 2x/day AC   losartan (COZAAR) tablet 50 mg Oral Daily   And      hydrochlorothiazide (HYDRODIURIL) tablet 12.5 mg Oral Daily   HYDROmorphone (DILAUDID) 1 mg/mL injection 0.2 mg Intravenous Q4H PRN   nitroGLYCERIN (  NITROSTAT) sublingual tablet 0.4 mg Sublingual Q5 Min PRN   NS flush syringe 10 mL Intravenous Q8HRS   NS flush syringe 10 mL Intravenous Q1H PRN   NS flush syringe 10 mL Intravenous Q8HRS   NS flush syringe 10 mL Intravenous Q1H PRN   ondansetron (ZOFRAN) 2 mg/mL injection 4 mg Intravenous Q8H PRN   pantoprazole (PROTONIX) delayed release tablet 40 mg Oral Daily   Pharmacy Vancomycin and/or Aminoglycoside Consult  Intravenous Once PRN   piperacillin-tazobactam (ZOSYN) 3.375 g in iso-osmotic 50 mL premix IVPB 3.375 g Intravenous Q6H   [START ON 10/25/2014] vancomycin (VANCOCIN) 1,500 mg in NS 500 mL IVPB 1,500 mg Intravenous Q12H       Allergies   Allergen Reactions    Vicodin [Hydrocodone-Acetaminophen] Itching       History   Substance Use Topics    Smoking status: Never Smoker     Smokeless tobacco: Not on file    Alcohol Use: No       No h/o diabetes in family.      ROS:   Constitutional: positive for chills  Eyes: negative for irritation and redness  Ears, nose, mouth, throat, and face: negative for nasal congestion and epistaxis  Respiratory: negative for cough or sputum  Cardiovascular: negative for chest pain  Gastrointestinal: negative for reflux symptoms, nausea and vomiting  Genitourinary:negative for dysuria and  nocturia  Integument/breast: positive for redness over left foot.  Hematologic/lymphatic: negative for easy bruising  Musculoskeletal:negative for arthralgias  Neurological: negative for headaches and dizziness        DNR Status:  Full Code    EXAM:  Temperature: 36.4 C (97.6 F)  Heart Rate: 87  BP (Non-Invasive): (!) 154/79 mmHg  Respiratory Rate: 16  SpO2-1: 99 %  Pain Score (Numeric, Faces): 8  General: appears in good health. No distress.   Eyes: Pupils equal and round, reactive to light and accomodation.   HEENT: Head atraumatic and normocephalic   Neck: No JVD or thyromegaly or lymphadenopathy   Lungs: Clear to auscultation bilaterally.   Cardiovascular: regular rate and rhythm, S1, S2 normal, no murmur  Abdomen: Soft, non-tender, Bowel sounds normal, No hepatosplenomegaly   Extremities: Right leg normal, atraumatic, no cyanosis or edema. Left foot shows area of redness warmth near the lateral three toes on the dorsal aspect of foot extending to the plantar aspect with some skin breakdown on plantar aspect of the foot but no discharge.  Skin: Skin warm and dry   Neurologic: Grossly normal   Lymphatics: No lymphadenopathy   Psychiatric: Normal affect, behavior,         Labs:    Lab Results for Last 24 Hours:    Results for orders placed or performed during the hospital encounter of 10/24/14 (from the past 24 hour(s))   CBC   Result Value Ref Range    WBC 5.5 4.0 - 11.0 K/uL    RBC 4.67 4.00 - 5.10 M/uL    HGB 13.4 (D) 12.0 - 15.5 g/dL    HCT 39.8 36.0 - 45.0 %    MCV 85.1 82.0 - 97.0 fL    MCH 28.7 28.3 - 34.3 pg    MCHC 33.8 32.0 - 36.0 g/dL    RDW 13.4 10.5 - 14.5 %    PLATELET COUNT 198 (U) 150 - 450 K/uL    MPV 9.2 7.4 - 10.5 fL    NRBC 0 0 - 0.3 /100 WBC    NRBC ABSOLUTE 0.00 0 -  0.02 K/uL    PMN % 69.1 43.0 - 76.0 %    LYMPHOCYTE % 22.9 15.0 - 43.0 %    MONOCYTE % 6.2 4.8 - 12.0 %    EOSINOPHIL % 1.2 0.0 - 5.2 %    BASOPHILS % 0.6 0.0 - 2.5 %    PMN # 3.8 1.5 - 6.5 K/uL    LYMPHOCYTE # 1.3 0.7 - 3.2  K/uL    MONOCYTE # 0.3 0.2 - 0.9 K/uL    EOSINOPHIL # 0.1 0.0 - 0.5 K/uL    BASOPHIL # 0.0 0.0 - 0.1 K/uL   COMPREHENSIVE METABOLIC PROFILE - [facility] ONLY   Result Value Ref Range    GLUCOSE 117 (H) 70 - 110 mg/dL    BUN 30 (H) 6 - 22 mg/dL    CREATININE 0.97 0.53 - 1.00 mg/dL    ESTIMATED GLOMERULAR FILTRATION RATE >60 >60 ml/min    SODIUM 136 136 - 145 mmol/L    POTASSIUM 3.6 3.5 - 5.0 mmol/L    CHLORIDE 104 101 - 111 mmol/L    CARBON DIOXIDE 27 22 - 32 mmol/L    ANION GAP 5 3 - 11 mmol/L    CALCIUM 9.4 8.5 - 10.5 mg/dL    TOTAL PROTEIN 6.9 6.0 - 8.0 g/dL    ALBUMIN 3.5 3.2 - 5.0 g/dL    ALBUMIN/GLOBULIN RATIO 1.0     BILIRUBIN, TOTAL 0.5 0.0 - 1.3 mg/dL    AST (SGOT) 27 0 - 45 IU/L    ALT (SGPT) 32 0 - 55 IU/L    ALKALINE PHOSPHATASE 71 35 - 120 IU/L   C-REACTIVE PROTEIN(CRP),INFLAMMATION   Result Value Ref Range    C-REACTIVE PROTEIN HIGH SENSITIVITY (INFLAMMATION) 3.2 0.0 - 8.0 mg/L   SEDIMENTATION RATE   Result Value Ref Range    SEDIMENTATION RATE 46 (H) 0 - 20 mm/hr   HCG SERUM - [facility] ONLY   Result Value Ref Range    HCG SERUM NEGATIVE NEGATIVE mIU/mL   POCT FINGERSTICK GLUCOSE   Result Value Ref Range    BLD GLUCOSE POCT 92 60 - 100 mg/dL       Imaging Studies:    Venous doppler- No evidence for deep venous thrombosis in the left lower extremity from the level of the common femoral to popliteal veins    MRI- Findings consistent with soft tissues cellulitis involving the fourth and fifth digit region of the forefoot. No underlying osteomyelitis.    DVT RISK FACTORS HAVE BEN ASSESSED AND PROPHYLAXIS ORDERED (SEE RUBYONLINE - REFERENCE TOOLS - MD, DVT PROPHY OR POCKET CARD)    Assessment/Plan:   Cellulitis left foot- failed outpatient treatment with clindamycin and keflex. Osteomyelitis ruled out. Zosyn Vancomycin Blood cultures. Prn dilaudid    Diabetes type 2- A1 C requested. Continue glipizide and SSI Protocol.    HTN- uncontrolled due to pain Continue prn dilaudid and losartan and thiazide. Monitor BP  likely to improve with better pain control.    Obesity- Encouraged to lose weight with diet and exercise.    DVT and GI prophylaxis. Lovenox and protonix.  Code status. Full code.        Portions of this note may be dictated using voice recognition software or a dictation service. Variances in spelling and vocabulary are possible and unintentional. Not all errors are caught/corrected. Please notify the [facility] if any discrepancies are noted or if the meaning of any statement is not clear.

## 2014-10-24 NOTE — ED Provider Notes (Signed)
[name]  Emergency Department Visit Note    Date: 10/24/2014  Primary care provider: Candee Furbish, MD  Means of arrival: private car  History obtained by: patient  History limited by: none      Chief Complaint: Skin infection    History of Present Illness     [name], date of birth 08-20-1966, is a 49 y.o. female who presents to the Emergency Department complaining of skin infection.    Context:  Patient states that she has been dealing with an infection to her left foot for the last 3 weeks.  States that she got a splinter in her foot like a month ago.  States that she tried to dig it out herself.  States that a week later it was all red and swollen.  States that she went to her PMD's office and was started on Clindamycin.  States that she finished the dosing of that but didn't improve and so she went back to her PMD and was started on Keflex (today is day 4 of her keflex).  States that it continues to get worse.  States that the area is sore and ranks it as 8/10.  Denies numbness/tingling.  Denies fever but admits to chills.  Denies n/v.  States pain is starting to radiate up into the lower leg.  States that she is starting to get body aches with it as well.  States also that she is on her menstrual cycle and it's making it all "worse" for her.  States that she is tearful over having to be here because of that.  Patient states that she has normal menstrual cramps.  States that she is NOT here for that and it's just "something else" going on that is causing her to be more sensitive that she wanted to mention.  No pre-er treatment or meds.  No other c/o.  Pertinent Past Medical History:  See nursing  Onset:  3 weeks  Timing:  Constant, progressive  Location/Radiation:  Left foot  Quality:  Sore, aching  Severity:  8/10  Modifying Factors:  As above  Associated Symptoms:   Positive:  As above  Negative:  As above    Review of Systems     The pertinent positive and  negative symptoms are as per HPI. All other systems reviewed and are negative.    Patient History      Past Medical History:  Past Medical History   Diagnosis Date    Anxiety     Depression     Arthritis     Irritable bowel syndrome     Obesity     Fatty liver     White coat hypertension     Diabetes mellitus     Cellulitis 10/24/14     left foot       Past Surgical History:  Past Surgical History   Procedure Laterality Date    Hx gall bladder surgery/chole  09/10/96    Hx other  1996    Hx pelvic laparoscopy  05/24/98    Hx tubal ligation  09/10/2012       Family History:  No family history on file.        Social History:  History   Substance Use Topics    Smoking status: Never Smoker     Smokeless tobacco: Not on file    Alcohol Use: No     History   Drug Use No  Medications:  Current Discharge Medication List      CONTINUE these medications which have NOT CHANGED    Details   ALPRAZolam (XANAX) 0.25 mg Oral Tablet Take 1 Tab (0.25 mg total) by mouth Every night as needed for Anxiety  Qty: 12 Tab, Refills: 0      cephalexin (KEFLEX) 500 mg Oral Capsule Take 500 mg by mouth Three times a day      glipiZIDE (GLUCOTROL) 5 mg Oral Tablet Take 5 mg by mouth Twice a day before meals Take 30 minutes before meals      Ibuprofen (MOTRIN) 800 mg Oral Tablet Take 1 Tab (800 mg total) by mouth Three times a day as needed for Pain  Qty: 60 Tab, Refills: 2      ketoconazole (NIZORAL) 2 % Cream Apply topically Twice daily      Loperamide (IMODIUM A-D) 2 mg Oral Tablet Take by mouth Once per day as needed      loratadine (CLARITIN) 10 mg Oral Tablet Take 10 mg by mouth Once per day as needed       Losartan-Hydrochlorothiazide 50-12.5 mg Oral Tablet Take 1 Tab by mouth Once a day             Allergies:   Allergies   Allergen Reactions    Vicodin [Hydrocodone-Acetaminophen] Itching       Physical Exam     Vital Signs:  Filed Vitals:    10/24/14 1034 10/24/14 1210 10/24/14 1229 10/24/14 1434   BP: 174/92 181/96  159/84 154/79   Pulse: 87 95 78 87   Temp:   36.6 C (97.9 F) 36.4 C (97.6 F)   Resp: _0 SpO2:  100% 100% 99%       The initial visit vital signs are reviewed as above.     Pulse Ox: 100% on None (Room Air); interpreted by me [id number]    GENERAL:  This is a well appearing 48yof patient who is interactive, appropriate and showing no outward signs of distress.  Tearful and appears uncomfortable and anxious in regards to being here but is non-toxic.  HEAD:  Atraumatic, normocephalic.  HEENT:  Conjunctival WNL, no gross deformity noted.  Mucous membranes are moist.  LYMPH:  No lymphadenopathy present.  NECK:  Supple, no meningeal signs.  HEART: RRR  LUNGS: CTA  ABDOMEN:  Soft.  Non-tender.  EXTREMITIES:  Patient with wound present to the plantar surface of the fold of the 5th toe and with erythema, warmth, edema to the 3rd-5th toes and distal metatarsals.  Area measures approximately 8 cm x 4 cm.  Skin outling placed.  There is no obvious FB noted.  She moves toes.  She has mild discomfort into the lower leg as well.  No red-streaking present.  Neurovascular remains instact distally.  SKIN:  Warm, good color.  No rashes noted.  Skin as above.  NEURO/PSYCH:  Patient is interactive, appropriate and grossly intact.      Diagnostics     Labs:    Results for orders placed or performed during the hospital encounter of 10/24/14   CBC   Result Value Ref Range    WBC 5.5 4.0 - 11.0 K/uL    RBC 4.67 4.00 - 5.10 M/uL    HGB 13.4 (D) 12.0 - 15.5 g/dL    HCT 39.8 36.0 - 45.0 %    MCV 85.1 82.0 - 97.0 fL    MCH 28.7 28.3 - 34.3 pg  MCHC 33.8 32.0 - 36.0 g/dL    RDW 13.4 10.5 - 14.5 %    PLATELET COUNT 198 (U) 150 - 450 K/uL    MPV 9.2 7.4 - 10.5 fL    NRBC 0 0 - 0.3 /100 WBC    NRBC ABSOLUTE 0.00 0 - 0.02 K/uL    PMN % 69.1 43.0 - 76.0 %    LYMPHOCYTE % 22.9 15.0 - 43.0 %    MONOCYTE % 6.2 4.8 - 12.0 %    EOSINOPHIL % 1.2 0.0 - 5.2 %    BASOPHILS % 0.6 0.0 - 2.5 %    PMN # 3.8 1.5 - 6.5 K/uL    LYMPHOCYTE # 1.3 0.7 -  3.2 K/uL    MONOCYTE # 0.3 0.2 - 0.9 K/uL    EOSINOPHIL # 0.1 0.0 - 0.5 K/uL    BASOPHIL # 0.0 0.0 - 0.1 K/uL   COMPREHENSIVE METABOLIC PROFILE - [facility] ONLY   Result Value Ref Range    GLUCOSE 117 (H) 70 - 110 mg/dL    BUN 30 (H) 6 - 22 mg/dL    CREATININE 0.97 0.53 - 1.00 mg/dL    ESTIMATED GLOMERULAR FILTRATION RATE >60 >60 ml/min    SODIUM 136 136 - 145 mmol/L    POTASSIUM 3.6 3.5 - 5.0 mmol/L    CHLORIDE 104 101 - 111 mmol/L    CARBON DIOXIDE 27 22 - 32 mmol/L    ANION GAP 5 3 - 11 mmol/L    CALCIUM 9.4 8.5 - 10.5 mg/dL    TOTAL PROTEIN 6.9 6.0 - 8.0 g/dL    ALBUMIN 3.5 3.2 - 5.0 g/dL    ALBUMIN/GLOBULIN RATIO 1.0     BILIRUBIN, TOTAL 0.5 0.0 - 1.3 mg/dL    AST (SGOT) 27 0 - 45 IU/L    ALT (SGPT) 32 0 - 55 IU/L    ALKALINE PHOSPHATASE 71 35 - 120 IU/L   C-REACTIVE PROTEIN(CRP),INFLAMMATION   Result Value Ref Range    C-REACTIVE PROTEIN HIGH SENSITIVITY (INFLAMMATION) 3.2 0.0 - 8.0 mg/L   SEDIMENTATION RATE   Result Value Ref Range    SEDIMENTATION RATE 46 (H) 0 - 20 mm/hr   HCG SERUM - [facility] ONLY   Result Value Ref Range    HCG SERUM NEGATIVE NEGATIVE mIU/mL   POCT FINGERSTICK GLUCOSE   Result Value Ref Range    BLD GLUCOSE POCT 92 60 - 100 mg/dL     Labs reviewed and interpreted by me.    Radiology:   US DOPPLER LEG:   No acute findings per radiology  MRI FOOT:  increased enhancement of soft tissues in the and around the fourth and fifth digits; no underlying abscess or osteomyelitis.     ED Progress Note/Medical Decision Making       Orders Placed This Encounter    BLOOD CULTURE - [place] ONLY    BLOOD CULTURE - [place] ONLY    CANCELED: MRI FOOT LEFT W IV CONTRAST    US VENOUS DOPPLER LOWER EXTREMITY LEFT    MRI FOOT LEFT WO/W IV CONTRAST    CBC    Comprehensive Metabolic Profile    C-Reactive Protein (CRP), Inflammation    Erythrocyte Sedimentation Rate (ESR)    HCG SERUM - [facility] ONLY    VANCOMYCIN, TROUGH - [facility] ONLY    POCT FINGERSTICK GLUCOSE    POCT FINGERSTICK GLUCOSE    INSERT & MAINTAIN  PERIPHERAL IV ACCESS    INSERT & MAINTAIN PERIPHERAL IV  ACCESS    PATIENT CLASS/LEVEL OF CARE DESIGNATION - [place]    NS flush syringe    NS flush syringe    HYDROmorphone (DILAUDID) 1 mg/mL injection    NS bolus infusion 1,000 mL    piperacillin-tazobactam (ZOSYN) 3.375 g in iso-osmotic 50 mL premix IVPB    vancomycin (VANCOCIN) 1 g in iso-osmotic 200 mL premix IVPB    gadoteridol (PROHANCE) 279.3 mg/mL injection    ALPRAZolam (XANAX) tablet    glipiZIDE (GLUCOTROL) tablet    nitroGLYCERIN (NITROSTAT) sublingual tablet    NS flush syringe    NS flush syringe    enoxaparin (LOVENOX) 40 mg/0.4 mL SubQ injection    HYDROmorphone (DILAUDID) 1 mg/mL injection    piperacillin-tazobactam (ZOSYN) 3.375 g in iso-osmotic 50 mL premix IVPB    Pharmacy Vancomycin and/or Aminoglycoside Consult    ondansetron (ZOFRAN) 2 mg/mL injection    pantoprazole (PROTONIX) delayed release tablet    losartan (COZAAR) tablet    And    hydrochlorothiazide (HYDRODIURIL) tablet    vancomycin (VANCOCIN) 1,500 mg in NS 500 mL IVPB       Patient had diagnostics as above.  I discussed with her from my original H&P that I felt that she would benefit from inpatient stay based on her continued symptoms with failed outpatient treatment on two different antibiotics.  Patient relates understanding to such.  She had MRI which showed ST enhancement consistent with cellulitis but no abscess or osteomyelitis.  Ultrasound was negative for DVT.  Patient was given dilaudid for pain with improvement (pain better and vitals improved).  Patient was given zosyn and vacomycin IV here after blood cultures were obtained.  Patient was discussed with Dr Laverta Baltimore who will be placing admit orders on the patient.  Patient aware of need for admission and is in agreement with such.  Patient is admitted to the hospital at this time in stable condition.  Patient was discussed with Dr. Luvenia Heller who was in agreement with treatment and  plan.      Pre-Disposition Vitals:  Filed Vitals:    10/24/14 1034 10/24/14 1210 10/24/14 1229 10/24/14 1434   BP: 174/92 181/96 159/84 154/79   Pulse: 87 95 78 87   Temp:   36.6 C (97.9 F) 36.4 C (97.6 F)   Resp: _0 SpO2:  100% 100% 99%       Clinical Impression      1. Acute left foot cellulitis with failed outpatient treatment and complicated with comorbidity of DM    Plan/Disposition     Admitted    Prescriptions:  Current Discharge Medication List          Follow Up:  No follow-up provider specified.        Condition on Disposition: Stable

## 2014-10-24 NOTE — ED Nurses Note (Signed)
Pt is eating lunch at this time

## 2014-10-24 NOTE — ED Nurses Note (Signed)
MRI checklist in progress.

## 2014-10-24 NOTE — ED Nurses Note (Signed)
Pt report cellulitis of left foot and right ovary pain.  Seen 2-3 weeks ago at [facility] and took Cleocin.  Now on Cephalexin since Wed

## 2014-10-24 NOTE — Pharmacy Vancomycin Dosing (Signed)
[facility] Vancomycin Consult   Lyons, 49 y.o. female  Date of Birth:  01-28-1966  Date of Admission:  10/24/2014  MRN# [id number]    Current height: Height: 5\' 7"   Current weight: Weight: 108.591 kg (239 lb 6.4 oz)    Drug: Vancomycin  Indication: Cellulitis  Organism: Unknown  Desired Level:  Trough- ~ 15 mcg/mL    Date RPh Current regimen  SCr mg/dl CrCl* ml/min Measured level mcg/ml Plan  Comments   10/24/14 AS Vancomycin 1,000 mg IV x 1 in ER then 1,500 mg IV Q12H 0.97 92  Trough 10/26/14 at 0000                                                                    *Creatinine clearance is estimated by using the Cockcroft-Gault equation.    Please contact pharmacy with any questions.    Leveda Anna, [place]   10/24/2014, 14:59

## 2014-10-24 NOTE — Care Plan (Signed)
Problem: Pain, Acute (Adult)  Goal: Identify Related Risk Factors and Signs and Symptoms  Related risk factors and signs and symptoms are identified upon initiation of Human Response Clinical Practice Guideline (CPG)   Outcome: Ongoing (see interventions/notes)

## 2014-10-24 NOTE — ED Nurses Note (Signed)
Recheck.

## 2014-10-24 NOTE — ED Nurses Note (Signed)
Skin marker used to encircle redness to dorsal aspect of left foot/below 2nd-5th toes.  Area of redness measures 8 cm width and 3.5 cm length.

## 2014-10-24 NOTE — Care Plan (Signed)
Problem: Pain, Acute (Adult)  Goal: Acceptable Pain Control/Comfort Level  Patient will demonstrate the desired outcomes by discharge/transition of care.   Outcome: Ongoing (see interventions/notes)

## 2014-10-24 NOTE — ED Nurses Note (Signed)
Pt reports feeling shaky and cold.  She states she has not eaten since 0800.  FSBS 92.  Pt detached from bedside monitor and IVF to ambulate to the bathroom at this time.

## 2014-10-24 NOTE — ED Nurses Note (Signed)
Report to [facility] on observation.

## 2014-10-25 DIAGNOSIS — E119 Type 2 diabetes mellitus without complications: Secondary | ICD-10-CM | POA: Diagnosis present

## 2014-10-25 DIAGNOSIS — L0390 Cellulitis, unspecified: Secondary | ICD-10-CM | POA: Diagnosis present

## 2014-10-25 LAB — PERFORM POC FINGERSTICK GLUCOSE
BLD GLUCOSE POCT: 87 mg/dL (ref 60–100)
BLD GLUCOSE POCT: 72 mg/dL (ref 60–100)
BLD GLUCOSE POCT: 120 mg/dL — ABNORMAL HIGH (ref 60–100)
BLD GLUCOSE POCT: 80 mg/dL (ref 60–100)
BLD GLUCOSE POCT: 80 mg/dL (ref 60–100)

## 2014-10-25 MED ORDER — IBUPROFEN 400 MG TABLET
800.00 mg | ORAL_TABLET | Freq: Four times a day (QID) | ORAL | Status: DC | PRN
Start: 2014-10-25 — End: 2014-10-27

## 2014-10-25 MED ORDER — LORATADINE 10 MG TABLET
10.00 mg | ORAL_TABLET | Freq: Every day | ORAL | Status: DC | PRN
Start: 2014-10-25 — End: 2014-10-27

## 2014-10-25 MED ORDER — UREA 10 % LOTION
TOPICAL_LOTION | Freq: Every evening | CUTANEOUS | Status: DC
Start: 2014-10-25 — End: 2014-10-27
  Filled 2014-10-25: qty 237

## 2014-10-25 MED ADMIN — HYDROcodone 5 mg-acetaminophen 325 mg tablet: TOPICAL | @ 22:00:00

## 2014-10-25 MED ADMIN — 5 ML LIDOCAINE 2% / 5 ML BUPIVACAINE 0.75% / HYALURONIDASE 100 UNITS: ORAL | @ 06:00:00 | NDC 67425000210

## 2014-10-25 NOTE — Consults (Signed)
[facility]                                  [place], [place] [address]                                     [phone]                             CONSULTATION    PATIENT NAME: [name], [name] [facility] [id number]  DATE OF SERVICE:10/25/2014  DATE OF BIRTH: 1966-05-17    PODIATRY CONSULTATION/HISTORY AND PHYSICAL     REFERRING PHYSICIAN:  Charolette Child, MD    REASON FOR CONSULTATION:  This patient is a 49 year old white female who was referred to me by Dr. Lisette Abu for evaluation and consultation of an open area on the bottom of her left foot - fourth interspace area.      HISTORY OF PRESENT ILLNESS:  The patient is a pleasant 49 year old white female who had accidentally stepped on something 3-4 weeks ago - a splinter.  She tried to remove it by pulling it out with her fingertips.  Consequently, it became infected.  She was seen by her primary care physician who placed her on clindamycin which did not help, and then she was switched over to Keflex.  Consequently, this infection became worse and she was seen in the Emergency Department recently and admitted to the hospital for observation.      HISTORY OF PRESENT ILLNESS:  The patient is a diabetic, less than six months' duration.  Her blood sugars appear to be under good control at this time - 72 this morning.    PAST MEDICAL HISTORY/MEDICATIONS:  Her past medical history and her medications were noted in her chart.    ALLERGIES:  Positive for VICODIN.    REVIEW OF SYSTEMS:  The patient denies any difficulty in swallowing medication in pill form.  Denies gastritis.  Denies shortness of breath.    OBJECTIVE:  The patient's skin texture, turgor, temperature is within normal limits.  There is a marked decrease in erythema and edema as noted by the previous outlining of the cellulitic area of the dorsal lateral plantar aspect of the left foot.  The area is nonpainful for  the patient today.  There is a plantar fissure plantar noted starting at the deep webspace fourth interspace, left foot, approximately 1.5 cm in length, very superficial, does not track to bone.  There is no foul odor or drainage noted.  The skin is intact.  There is mild amount of hyperkeratotic tissue at the submetatarsal 5 area as well as lateral aspect of the fifth metatarsal head, left foot.  Again, the skin is intact.  No ulceration is noted.  The right foot is asymptomatic.  The nails are within normal limits and trimmed by the patient.    VASCULAR STATUS:  Dorsalis pedis is 1/4 bilateral.  PT 1/4 bilateral.  Biomechanically, the patient has a rectus to planus foot type, which is flexible.  The range of motion of both first metatarsophalangeal joints are within normal limits.  There is no pain with palpation over the fourth interspace or fifth metatarsal head, left foot.    IMAGING DATA:  Radiology results show no evidence for DVT in the left lower extremity, and an MRI of the left foot with and without contrast has findings consistent with soft tissue cellulitis involving the fourth and fifth digit region of the forefoot.  No underlying osteomyelitis is noted.     LABORATORY DATA:  Previous blood cultures were negative for growth after 1 day of incubation.    ASSESSMENT:    1.  Diabetic skin fissuring, fourth interspace left.    2.  Possible foreign object with corresponding cellulitis following patient's attempt at trying to remove this and consequently became infected.  Will treat conservatively.    RECOMMENDATIONS:  Treatment today consisted of obtaining podiatric physical and history on this patient.  I evaluated both feet.  I was able to trim some mild hyperkeratotic tissue from the plantar aspect of the fourth interspace of the left foot to the patient's tolerance.  Again, the skin was intact.  There is no foul odor drainage or bleeding noted.  The area was dressed with bacitracin ointment.  Orders  will be written for local wound care, wash this area with warm soapy water daily, followed by a bacitracin ointment and DSD.  At bedtime, the patient will obtain and apply, Carmol 10 lotion or cream to this area under occlusion with plastic wrap was discussed with her, nightly for the next 2 weeks, and wear a light weight sock to bed.  In the morning, the patient will remove the sock and plastic wrap, and wash the area and continue to apply bacitracin ointment until healed.  The patient was given my business card if she wishes to continue in my care.    Thank you for allowing me to share in the care of this patient.      April Holding, DPM      JN:[id number]; D: 10/25/2014 13:01:21; T: 10/25/2014 16:01:45

## 2014-10-25 NOTE — Consults (Signed)
Thank you for this consult.  This patient was seen, evaluated and treated today.  A full podiatric H & P/ consult will be dictated.

## 2014-10-25 NOTE — Progress Notes (Signed)
[facility]  [place], [place] [address]    Medicine Progress Note    [name]  Date of service: 10/25/2014  Date of Admission:  10/24/2014    Hospital Day:    Subjective: Less swollen    Vital Signs:  Temp (24hrs) Max:36.8 C (98.2 F)    No Data Recorded  Systolic (123XX123), 99991111 mmHg, Min:136 mmHg, 123456 mmHg    Diastolic (123XX123), 123XX123 mmHg, Min:79 mmHg, Max:126 mmHg    Temp  Avg: 36.6 C (97.8 F)  Min: 36.4 C (97.6 F)  Max: 36.8 C (98.2 F)  Pulse  Avg: 85  Min: 78  Max: 95  Resp  Avg: 16.5  Min: 16  Max: 18  SpO2  Avg: 99.7 %  Min: 99 %  Max: 100 %  Pain Score (Numeric, Faces): 0  Fi02    Weight: 107.775 kg (237 lb 9.6 oz)      Input/Output  I/O last 24 hours: I/O current shift:     Intake/Output Summary (Last 24 hours) at 10/25/14 0938  Last data filed at 10/25/14 0800   Gross per 24 hour   Intake    480 ml   Output      0 ml   Net    480 ml    01/31 0800 - 01/31 1559  In: 120 [P.O.:120]  Out: -        Current Facility-Administered Medications:  ALPRAZolam (XANAX) tablet 0.25 mg Oral HS PRN   enoxaparin (LOVENOX) 40 mg/0.4 mL SubQ injection 40 mg Subcutaneous Daily   glipiZIDE (GLUCOTROL) tablet 5 mg Oral 2x/day AC   losartan (COZAAR) tablet 50 mg Oral Daily   And      hydrochlorothiazide (HYDRODIURIL) tablet 12.5 mg Oral Daily   HYDROmorphone (DILAUDID) 1 mg/mL injection 0.2 mg Intravenous Q4H PRN   ibuprofen (MOTRIN) tablet 800 mg Oral Q6H PRN   loratadine (CLARITIN) tablet 10 mg Oral Daily PRN   NS flush syringe 10 mL Intravenous Q8HRS   ondansetron (ZOFRAN) 2 mg/mL injection 4 mg Intravenous Q8H PRN   pantoprazole (PROTONIX) delayed release tablet 40 mg Oral Daily   Pharmacy Vancomycin and/or Aminoglycoside Consult  Intravenous Once PRN   piperacillin-tazobactam (ZOSYN) 3.375 g in iso-osmotic 50 mL premix IVPB 3.375 g Intravenous Q6H   vancomycin (VANCOCIN) 1,500 mg in NS 500 mL IVPB 1,500 mg Intravenous Q12H       Allergies   Allergen Reactions    Vicodin  [Hydrocodone-Acetaminophen] Itching       Physical Exam:  General: no distress  left lateral foot mildly warm, dark red.  Linear crack in skin between 4 and 5 toes.  Callous laterally    Labs:  I have reviewed all lab results.  Lab Results for Last 24 Hours:  Results for orders placed or performed during the hospital encounter of 10/24/14 (from the past 24 hour(s))   CBC   Result Value Ref Range    WBC 5.5 4.0 - 11.0 K/uL    RBC 4.67 4.00 - 5.10 M/uL    HGB 13.4 (D) 12.0 - 15.5 g/dL    HCT 39.8 36.0 - 45.0 %    MCV 85.1 82.0 - 97.0 fL    MCH 28.7 28.3 - 34.3 pg    MCHC 33.8 32.0 - 36.0 g/dL    RDW 13.4 10.5 - 14.5 %    PLATELET COUNT 198 (U) 150 - 450 K/uL    MPV 9.2 7.4 - 10.5 fL  NRBC 0 0 - 0.3 /100 WBC    NRBC ABSOLUTE 0.00 0 - 0.02 K/uL    PMN % 69.1 43.0 - 76.0 %    LYMPHOCYTE % 22.9 15.0 - 43.0 %    MONOCYTE % 6.2 4.8 - 12.0 %    EOSINOPHIL % 1.2 0.0 - 5.2 %    BASOPHILS % 0.6 0.0 - 2.5 %    PMN # 3.8 1.5 - 6.5 K/uL    LYMPHOCYTE # 1.3 0.7 - 3.2 K/uL    MONOCYTE # 0.3 0.2 - 0.9 K/uL    EOSINOPHIL # 0.1 0.0 - 0.5 K/uL    BASOPHIL # 0.0 0.0 - 0.1 K/uL   COMPREHENSIVE METABOLIC PROFILE - [facility] ONLY   Result Value Ref Range    GLUCOSE 117 (H) 70 - 110 mg/dL    BUN 30 (H) 6 - 22 mg/dL    CREATININE 0.97 0.53 - 1.00 mg/dL    ESTIMATED GLOMERULAR FILTRATION RATE >60 >60 ml/min    SODIUM 136 136 - 145 mmol/L    POTASSIUM 3.6 3.5 - 5.0 mmol/L    CHLORIDE 104 101 - 111 mmol/L    CARBON DIOXIDE 27 22 - 32 mmol/L    ANION GAP 5 3 - 11 mmol/L    CALCIUM 9.4 8.5 - 10.5 mg/dL    TOTAL PROTEIN 6.9 6.0 - 8.0 g/dL    ALBUMIN 3.5 3.2 - 5.0 g/dL    ALBUMIN/GLOBULIN RATIO 1.0     BILIRUBIN, TOTAL 0.5 0.0 - 1.3 mg/dL    AST (SGOT) 27 0 - 45 IU/L    ALT (SGPT) 32 0 - 55 IU/L    ALKALINE PHOSPHATASE 71 35 - 120 IU/L   C-REACTIVE PROTEIN(CRP),INFLAMMATION   Result Value Ref Range    C-REACTIVE PROTEIN HIGH SENSITIVITY (INFLAMMATION) 3.2 0.0 - 8.0 mg/L   SEDIMENTATION RATE   Result Value Ref Range    SEDIMENTATION RATE 46 (H) 0 -  20 mm/hr   BLOOD CULTURE - [facility] ONLY   Result Value Ref Range    BLOOD CULTURE NO GROWTH AFTER 16 HOURS INCUBATION.    HCG SERUM - [facility] ONLY   Result Value Ref Range    HCG SERUM NEGATIVE NEGATIVE mIU/mL   BLOOD CULTURE - [facility] ONLY   Result Value Ref Range    BLOOD CULTURE NO GROWTH AFTER 16 HOURS INCUBATION.    POCT FINGERSTICK GLUCOSE   Result Value Ref Range    BLD GLUCOSE POCT 92 60 - 100 mg/dL   POCT FINGERSTICK GLUCOSE   Result Value Ref Range    BLD GLUCOSE POCT 92 60 - 100 mg/dL   POCT FINGERSTICK GLUCOSE   Result Value Ref Range    BLD GLUCOSE POCT 99 60 - 100 mg/dL   POCT FINGERSTICK GLUCOSE   Result Value Ref Range    BLD GLUCOSE POCT 87 60 - 100 mg/dL       Imaging Studies:      Consults: Reviewed        Assessment/ Plan:   Active Hospital Problems    Diagnosis    Primary Problem: Diabetic foot infection    Type 2 diabetes mellitus     Will need to get weight off lateral foot.  Have podiatry see.    DVT/PE Prophylaxis: Lovenox    Disposition Planning: Home discharge

## 2014-10-25 NOTE — Nurses Notes (Signed)
Paged Dr Dina Rich on this date & time regarding the Podiatry consult on this pt. Awaiting callback

## 2014-10-25 NOTE — Nurses Notes (Signed)
Patient is awake and ambulating in the hallway.

## 2014-10-26 LAB — BASIC METABOLIC PROFILE - BMC/JMC ONLY
ANION GAP: 3 mmol/L (ref 3–11)
BUN: 15 mg/dL — AB (ref 6–22)
CALCIUM: 9.3 mg/dL (ref 8.5–10.5)
CARBON DIOXIDE: 29 mmol/L (ref 22–32)
CHLORIDE: 107 mmol/L (ref 101–111)
CREATININE: 0.91 mg/dL (ref 0.53–1.00)
ESTIMATED GLOMERULAR FILTRATION RATE: >60 mL/min (ref >60)
GLUCOSE: 79 mg/dL (ref 70–110)
POTASSIUM: 4.2 mmol/L (ref 3.5–5.0)
SODIUM: 139 mmol/L (ref 136–145)

## 2014-10-26 LAB — CBC
BASOPHIL #: 0 10*3/uL (ref 0.0–0.1)
BASOPHILS %: 0.6 % (ref 0.0–2.5)
EOSINOPHIL #: 0.1 10*3/uL (ref 0.0–0.5)
EOSINOPHIL %: 1.2 % (ref 0.0–5.2)
HCT: 39.8 % (ref 36.0–45.0)
HGB: 13.4 g/dL — AB (ref 12.0–15.5)
LYMPHOCYTE #: 1.3 K/uL (ref 0.7–3.2)
LYMPHOCYTE %: 22.9 % (ref 15.0–43.0)
MCH: 28.7 pg (ref 28.3–34.3)
MCHC: 33.8 g/dL (ref 32.0–36.0)
MCV: 85.1 fL (ref 82.0–97.0)
MCV: 85.1 fL (ref 82.0–97.0)
MONOCYTE #: 0.3 K/uL (ref 0.2–0.9)
MONOCYTE %: 6.2 % (ref 4.8–12.0)
MPV: 9.2 fL (ref 7.4–10.5)
NRBC ABSOLUTE: 0 10*3/uL (ref 0–0.02)
NRBC: 0 /100{WBCs} (ref 0–0.3)
PLATELET COUNT: 198 K/uL (ref 150–450)
PMN #: 3.8 10*3/uL (ref 1.5–6.5)
PMN %: 69.1 % (ref 43.0–76.0)
PMN %: 69.1 % (ref 43.0–76.0)
RBC: 4.67 M/uL (ref 4.00–5.10)
RDW: 13.4 % (ref 10.5–14.5)
WBC: 5.5 10*3/uL (ref 4.0–11.0)

## 2014-10-26 LAB — VANCOMYCIN, TROUGH - BMC/JMC ONLY
DOSE VANCOMYCIN: 1500
TIME OF LAST DOSE: 0
VANCOMYCIN LAST DOSE DATE: 0
VANCOMYCIN, TROUGH: 23.1 ug/mL — ABNORMAL HIGH (ref 10.0–20.0)
VANCOMYCIN, TROUGH: 23.1 ug/mL — ABNORMAL HIGH (ref 10.0–20.0)

## 2014-10-26 LAB — COMPREHENSIVE METABOLIC PROFILE - BMC/JMC ONLY
ALBUMIN/GLOBULIN RATIO: 1
ALBUMIN: 3.5 g/dL (ref 3.2–5.0)
ALKALINE PHOSPHATASE: 71 IU/L (ref 35–120)
ALT (SGPT): 32 IU/L (ref 0–55)
ANION GAP: 5 mmol/L (ref 3–11)
AST (SGOT): 27 IU/L (ref 0–45)
BILIRUBIN, TOTAL: 0.5 mg/dL (ref 0.0–1.3)
BUN: 30 mg/dL — ABNORMAL HIGH (ref 6–22)
CALCIUM: 9.4 mg/dL (ref 8.5–10.5)
CARBON DIOXIDE: 27 mmol/L (ref 22–32)
CHLORIDE: 104 mmol/L (ref 101–111)
CREATININE: 0.97 mg/dL (ref 0.53–1.00)
ESTIMATED GLOMERULAR FILTRATION RATE: >60 mL/min (ref >60)
GLUCOSE: 117 mg/dL — ABNORMAL HIGH (ref 70–110)
POTASSIUM: 3.6 mmol/L (ref 3.5–5.0)
SODIUM: 136 mmol/L (ref 136–145)
TOTAL PROTEIN: 6.9 g/dL (ref 6.0–8.0)

## 2014-10-26 LAB — HCG SERUM - BMC/JMC ONLY: HCG SERUM: NEGATIVE m[IU]/mL

## 2014-10-26 LAB — PERFORM POC FINGERSTICK GLUCOSE
BLD GLUCOSE POCT: 99 mg/dL (ref 60–100)
BLD GLUCOSE POCT: 95 mg/dL (ref 60–100)
BLD GLUCOSE POCT: 106 mg/dL — ABNORMAL HIGH (ref 60–100)
BLD GLUCOSE POCT: 111 mg/dL — ABNORMAL HIGH (ref 60–100)

## 2014-10-26 LAB — HGA1C (HEMOGLOBIN A1C WITH EST AVG GLUCOSE)
ESTIMATED AVERAGE GLUCOSE: 103 mg/dL (ref 70–110)
GLYCOHEMOGLOBIN: 5.2 % (ref 4.0–6.0)

## 2014-10-26 LAB — SEDIMENTATION RATE: SEDIMENTATION RATE: 46 mm/hr — ABNORMAL HIGH (ref 0–20)

## 2014-10-26 MED ORDER — LACTOBACILLUS ACIDOPH-L.BULGARICUS 1 MILLION CELL TABLET
1.0000 | ORAL_TABLET | Freq: Two times a day (BID) | ORAL | Status: DC
Start: 2014-10-26 — End: 2014-10-27
  Administered 2014-10-26 – 2014-10-27 (×2): 1 via ORAL
  Filled 2014-10-26 (×2): qty 1

## 2014-10-26 MED ORDER — VANCOMYCIN 1 GRAM/200 ML IN DEXTROSE 5 % INTRAVENOUS PIGGYBACK
1000.00 mg | INJECTION | Freq: Two times a day (BID) | INTRAVENOUS | Status: DC
Start: 2014-10-26 — End: 2014-10-27
  Administered 2014-10-26 – 2014-10-27 (×3): 1000 mg via INTRAVENOUS
  Filled 2014-10-26 (×5): qty 200

## 2014-10-26 MED ORDER — BACITRACIN 500 UNIT/G OINTMENT TUBE
TOPICAL_OINTMENT | Freq: Two times a day (BID) | CUTANEOUS | Status: DC
Start: 2014-10-26 — End: 2014-10-27
  Filled 2014-10-26: qty 14

## 2014-10-26 MED ORDER — BACITRACIN 500 UNIT/GRAM TOPICAL PACKET
PACK | CUTANEOUS | Status: AC
Start: 2014-10-26 — End: 2014-10-26
  Filled 2014-10-26: qty 1

## 2014-10-26 MED ADMIN — sennosides 8.6 mg-docusate sodium 50 mg tablet: @ 13:00:00

## 2014-10-26 NOTE — Care Plan (Signed)
Problem: Pain, Acute (Adult)  Goal: Acceptable Pain Control/Comfort Level  Patient will demonstrate the desired outcomes by discharge/transition of care.   Outcome: Ongoing (see interventions/notes)

## 2014-10-26 NOTE — Nurses Notes (Signed)
Patient appears asleep

## 2014-10-26 NOTE — Pharmacy Vancomycin Dosing (Signed)
[facility] Vancomycin Consult   Marathon, 49 y.o. female  Date of Birth:  12-May-1966  Date of Admission:  10/24/2014  MRN# [id number]    Current height: Height: 5\' 7"   Current weight: Weight: 107.775 kg (237 lb 9.6 oz)    Drug: VancomycinIndication: Cellulitis  Organism: Unknown  Desired Level: Trough- ~ 15 mcg/mL    Date RPh Current regimen  SCr mg/dl CrCl* ml/min Measured level mcg/ml Plan  Comments   10/24/14 AS Vancomycin 1,000 mg IV x 1 in ER then 1,500 mg IV Q12H 0.97 92  Trough 10/26/14 at 0000    2-1 KD Vanc 1500mg  q12   Trough = 23.1 Decrease dose to Vanc 1G q12 and trough @ 1900 on 2-2 before 4th new dose                                                          *Creatinine clearance is estimated by using the Cockcroft-Gault equation.    Please contact pharmacy with any questions.    Leveda Anna, [place]   10/24/2014, 14:59

## 2014-10-26 NOTE — Care Plan (Signed)
Problem: Pain, Acute (Adult)  Goal: Identify Related Risk Factors and Signs and Symptoms  Related risk factors and signs and symptoms are identified upon initiation of Human Response Clinical Practice Guideline (CPG)   Outcome: Ongoing (see interventions/notes)

## 2014-10-26 NOTE — Progress Notes (Signed)
[facility]  Medicine Progress Note      [name]  Date of service: 10/26/2014  Date of Admission:  10/24/2014    Hospital Day:  LOS: 1 day   Subjective: Feeling better.      Vital Signs:  Temp (24hrs) Max:36.4 C (Q000111Q F)      Systolic (123XX123), 0000000 mmHg, Min:149 mmHg, 123456 mmHg    Diastolic (123XX123), 123456 mmHg, Min:86 mmHg, Max:94 mmHg    Temp  Avg: 36.4 C (97.6 F)  Min: 36.4 C (97.5 F)  Max: 36.4 C (97.6 F)  Pulse  Avg: 82  Min: 79  Max: 85  Resp  Avg: 19.3  Min: 18  Max: 20  SpO2  Avg: 99.3 %  Min: 98 %  Max: 100 %  MAP (Non-Invasive)  Avg: 102 mmHG  Min: 102 mmHG  Max: 102 mmHG  Pain Score (Numeric, Faces): 0  Fi02    I/O:  I/O last 24 hours:    Intake/Output Summary (Last 24 hours) at 10/26/14 1322  Last data filed at 10/26/14 1200   Gross per 24 hour   Intake    730 ml   Output      0 ml   Net    730 ml     I/O current shift:  02/01 0800 - 02/01 1559  In: 290 [P.O.:240; I.V.:50]  Out: -       Current Facility-Administered Medications:  ALPRAZolam (XANAX) tablet 0.25 mg Oral HS PRN   bacitracin 500 units/gram topical ointment tube  Topical 2x/day   bacitracin topical ointment ---Cabinet Override      enoxaparin (LOVENOX) 40 mg/0.4 mL SubQ injection 40 mg Subcutaneous Daily   glipiZIDE (GLUCOTROL) tablet 5 mg Oral 2x/day AC   losartan (COZAAR) tablet 50 mg Oral Daily   And      hydrochlorothiazide (HYDRODIURIL) tablet 12.5 mg Oral Daily   HYDROmorphone (DILAUDID) 1 mg/mL injection 0.2 mg Intravenous Q4H PRN   ibuprofen (MOTRIN) tablet 800 mg Oral Q6H PRN   lactobacillus acidoph-bulgar (FLORANEX) 1 million cell tablet 1 Tab Oral 2x/day-Food   loratadine (CLARITIN) tablet 10 mg Oral Daily PRN   NS flush syringe 10 mL Intravenous Q8HRS   ondansetron (ZOFRAN) 2 mg/mL injection 4 mg Intravenous Q8H PRN   pantoprazole (PROTONIX) delayed release tablet 40 mg Oral Daily   Pharmacy Vancomycin and/or Aminoglycoside Consult  Intravenous Once PRN   piperacillin-tazobactam (ZOSYN) 3.375 g in  iso-osmotic 50 mL premix IVPB 3.375 g Intravenous Q6H   urea 10% topical lotion  Apply Topically NIGHTLY   vancomycin (VANCOCIN) 1 g in iso-osmotic 200 mL premix IVPB 1,000 mg Intravenous Q12H       Allergies   Allergen Reactions    Vicodin [Hydrocodone-Acetaminophen] Itching       Physical Exam:  General: appears in good health, mildly obese, appears stated age, no distress and vital signs reviewed  Lungs: Clear to auscultation bilaterally.   Cardiovascular: regular rate and rhythm  Abdomen: Soft, non-tender  Extremities: Marked improvement in redness/ edema left foot; left with large crack in plantar aspect under great toe    Labs:  Lab Results for Last 24 Hours:    Results for orders placed or performed during the hospital encounter of 10/24/14 (from the past 24 hour(s))   POCT FINGERSTICK GLUCOSE   Result Value Ref Range    BLD GLUCOSE POCT 120 (H) 60 - 100 mg/dL   POCT FINGERSTICK GLUCOSE   Result Value Ref Range  BLD GLUCOSE POCT 80 60 - 100 mg/dL   VANCOMYCIN, TROUGH - [facility] ONLY   Result Value Ref Range    VANCOMYCIN, TROUGH 23.1 (H) 10.0 - 20.0 ug/mL    DOSE VANCOMYCIN 1500 MG     ROUTE VANCOMYCIN IV     VANCOMYCIN LAST DOSE DATE .     TIME OF LAST DOSE .    POCT FINGERSTICK GLUCOSE   Result Value Ref Range    BLD GLUCOSE POCT 99 60 - 100 mg/dL   POCT FINGERSTICK GLUCOSE   Result Value Ref Range    BLD GLUCOSE POCT 95 60 - 100 mg/dL   BASIC METABOLIC PROFILE - [facility] ONLY   Result Value Ref Range    GLUCOSE 79 70 - 110 mg/dL    BUN 15 (D) 6 - 22 mg/dL    CREATININE 0.91 0.53 - 1.00 mg/dL    ESTIMATED GLOMERULAR FILTRATION RATE >60 >60 ml/min    SODIUM 139 136 - 145 mmol/L    POTASSIUM 4.2 3.5 - 5.0 mmol/L    CHLORIDE 107 101 - 111 mmol/L    CARBON DIOXIDE 29 22 - 32 mmol/L    ANION GAP 3 3 - 11 mmol/L    CALCIUM 9.3 8.5 - 10.5 mg/dL         Microbiology:  NGTD        Consults: Podiatry        Assessment/ Plan:   Active Hospital Problems   (*Primary Problem)    Diagnosis    *Diabetic foot infection       Type 2 diabetes mellitus     Chronic    Cellulitis       Continue wound care as outlined by podiatry. Possible switch to oral med. Continue IV today as patient had failed 2 prior outpt meds and is only now improving.     DVT/PE Prophylaxis: Lovenox    Disposition Planning: Home discharge

## 2014-10-27 LAB — PERFORM POC FINGERSTICK GLUCOSE
BLD GLUCOSE POCT: 108 mg/dL — ABNORMAL HIGH (ref 60–100)
BLD GLUCOSE POCT: 74 mg/dL (ref 60–100)

## 2014-10-27 MED ORDER — CEPHALEXIN 500 MG CAPSULE
500.00 mg | ORAL_CAPSULE | Freq: Three times a day (TID) | ORAL | Status: DC
Start: 2014-10-27 — End: 2014-11-29

## 2014-10-27 MED ORDER — UREA 10 % TOPICAL CREAM
TOPICAL_CREAM | Freq: Two times a day (BID) | CUTANEOUS | Status: DC
Start: 2014-10-27 — End: 2014-11-29

## 2014-10-27 MED ORDER — BACITRACIN ZINC 500 UNIT/GRAM TOPICAL OINTMENT
TOPICAL_OINTMENT | Freq: Two times a day (BID) | CUTANEOUS | Status: DC
Start: 2014-10-27 — End: 2014-11-29

## 2014-10-27 MED ADMIN — sucralfate 1 gram tablet: ORAL | @ 06:00:00

## 2014-10-27 NOTE — Progress Notes (Signed)
[facility]  Medicine Progress Note    [name]  Date of service: 10/27/2014  Date of Admission:  10/24/2014    Hospital Day:  LOS: 2 days   Subjective: Feels well. Wants to go home.    Vital Signs:  Temp (24hrs) Max:36.7 C (A999333 F)      Systolic (123XX123), 123456 mmHg, Min:135 mmHg, 0000000 mmHg    Diastolic (123XX123), 123XX123 mmHg, Min:74 mmHg, Max:94 mmHg    Temp  Avg: 36.6 C (97.9 F)  Min: 36.4 C (97.5 F)  Max: 36.7 C (98.1 F)  Pulse  Avg: 71.4  Min: 66  Max: 76  Resp  Avg: 18  Min: 18  Max: 18  SpO2  Avg: 99 %  Min: 97 %  Max: 100 %  Pain Score (Numeric, Faces): 0  Fi02    I/O:  I/O last 24 hours:    Intake/Output Summary (Last 24 hours) at 10/27/14 1040  Last data filed at 10/26/14 1900   Gross per 24 hour   Intake    530 ml   Output      0 ml   Net    530 ml     I/O current shift:       Physical Exam:  Afebrile; VSS  Left foot; no erythema; crack on plantar surface under left 5th digit; soft; no erythema or drainage  Data Reviewed:  Hemoglobin A1C 5.2%  Assessment/ Plan:   Active Hospital Problems   (*Primary Problem)    Diagnosis    *Diabetic foot infection    Type 2 diabetes mellitus     Chronic    Cellulitis       Ok for discharge with close followup with PCP and podiatry. Patient prefers to find podiatrist in [facility]

## 2014-10-27 NOTE — Nurses Notes (Signed)
Patient discharged home with family.  AVS reviewed with patient/care giver.  A written copy of the AVS and discharge instructions was given to the patient/care giver.  Questions sufficiently answered as needed.  Patient/care giver encouraged to follow up with PCP as indicated.  In the event of an emergency, patient/care giver instructed to call 911 or go to the nearest emergency room.

## 2014-10-27 NOTE — Nurses Notes (Signed)
Documentation of Melissa RN reviewed and agreed upon.

## 2014-10-29 LAB — BLOOD CULTURE - BMC ONLY
BLOOD CULTURE: NO GROWTH
BLOOD CULTURE: NO GROWTH

## 2014-11-02 ENCOUNTER — Ambulatory Visit (INDEPENDENT_AMBULATORY_CARE_PROVIDER_SITE_OTHER): Payer: PRIVATE HEALTH INSURANCE | Admitting: Primary Podiatric Medicine

## 2014-11-02 ENCOUNTER — Encounter: Payer: Self-pay | Admitting: Primary Podiatric Medicine

## 2014-11-02 VITALS — BP 171/103 | HR 102 | Temp 98.3°F | Resp 16 | Ht 66.5 in | Wt 232.0 lb

## 2014-11-02 DIAGNOSIS — E1142 Type 2 diabetes mellitus with diabetic polyneuropathy: Secondary | ICD-10-CM

## 2014-11-02 DIAGNOSIS — R234 Changes in skin texture: Secondary | ICD-10-CM

## 2014-11-02 DIAGNOSIS — L97521 Non-pressure chronic ulcer of other part of left foot limited to breakdown of skin: Secondary | ICD-10-CM

## 2014-11-02 DIAGNOSIS — E1140 Type 2 diabetes mellitus with diabetic neuropathy, unspecified: Secondary | ICD-10-CM

## 2014-11-02 DIAGNOSIS — L03116 Cellulitis of left lower limb: Secondary | ICD-10-CM

## 2014-11-02 DIAGNOSIS — M2042 Other hammer toe(s) (acquired), left foot: Secondary | ICD-10-CM

## 2014-11-02 DIAGNOSIS — M2041 Other hammer toe(s) (acquired), right foot: Secondary | ICD-10-CM

## 2014-11-02 NOTE — Progress Notes (Signed)
Filed Vitals:    11/02/14 0854   BP: 171/103   Pulse: 102   Temp: 98.3 F (36.8 C)   Resp: 16       Active Ambulatory Problems     Diagnosis Date Noted   . No Active Ambulatory Problems     Resolved Ambulatory Problems     Diagnosis Date Noted   . No Resolved Ambulatory Problems     Past Medical History   Diagnosis Date   . Anxiety    . Depression    . Arthritis    . Irritable bowel syndrome    . Obesity    . Fatty liver    . White coat hypertension    . Diabetes mellitus    . Cellulitis         Reviewed vital signs, past medical history, social history, family history, medications and allergies with patient.    1. Type 2 diabetes mellitus with diabetic polyneuropathy     2. Ulcer of toe, left, limited to breakdown of skin     3. Cellulitis of left lower extremity     4. Hammer toes of both feet     5. Fissure in skin of foot         SUBJECTIVE: The patient is a 49 year old female with a history  of diabetes, anxiety, depression, and hypertension, who is seen  for exam and treatment of her lower extremity.  The patient was  recently hospitalized with the cellulitis over this left foot.  The patient realized that in December she got a splinter in this  foot and tried to remove it herself.  The patient relates  subsequently this foot became red, hot, and swollen, especially  at this foot and ankle.  The patient was finally admitted and  was given IV antibiotics, which seemed to calm this area down.  The patient is currently on Keflex finishing off this course.  The patient relates that she has significantly improved her  blood glucose recently from an A1c of 8.1 down to 5.2.  The  patient was just recently diagnosed with diabetes.  Denies any  fever, chills, nausea, and vomiting today.    REVIEW OF SYSTEMS: The patient is awake and oriented to her  situation.  Resting comfortably, in no apparent distress.  Denies chest pain or shortness of breath today.  Denies any  claudication type pain.  Does have some numbness  and burning in  these feet.    OBJECTIVE FINDINGS: The patient with warm, dry, supple skin over  this lower extremity.  The patient with a fissure location at  this left 5th digit starting at this plantar aspect and wrapping  around this 5th toe laterally.  This is where this splinter was  initially placed.  Today, this is largely healed with only this  hyperkeratotic buildup and small fissure.  There is no acute  drainage from this area and no signs of infection.  The patient  with palpable pulses and cap refill time less than 3 seconds.  Denies claudication pain.  The patient with decreased protective  sensation to this midfoot with a monofilament today.  Does  complain of burning and tingling, but denies Tinel's sign or  Mulder's sign.  The patient does have a hammer digit syndrome 2  through 5 with varus rotation to this 5th bilaterally and a wide  foot.    ASSESSMENT:  1.  Diabetes with neuropathy.  2.  Resolving cellulitis and ulceration, left  fifth digit.  3.  Hammer digit syndrome.    PLAN: At this point, following patient's consent, we did debride  the hyperkeratotic tissue from this fissure site with a  15-blade.  At this point, it is largely healed and there is no  acute drainage.  The patient is continuing with urea cream and  antibiotic cream to this site.  Advised the patient to continue  with this treatment.  At this point, we will also order the  patient a new diabetic shoes to help take the pressure off this  lower extremity.  Advised at this point to be safe, we will have  her offer additional 7-10 days for work and then once this is  completely resolved, we will give her a note to return to work  at that time.

## 2014-11-11 ENCOUNTER — Encounter: Payer: Self-pay | Admitting: Primary Podiatric Medicine

## 2014-11-11 ENCOUNTER — Ambulatory Visit (INDEPENDENT_AMBULATORY_CARE_PROVIDER_SITE_OTHER): Payer: PRIVATE HEALTH INSURANCE | Admitting: Primary Podiatric Medicine

## 2014-11-11 VITALS — BP 156/93 | HR 93 | Temp 98.2°F | Wt 228.0 lb

## 2014-11-11 DIAGNOSIS — M2041 Other hammer toe(s) (acquired), right foot: Secondary | ICD-10-CM

## 2014-11-11 DIAGNOSIS — E1140 Type 2 diabetes mellitus with diabetic neuropathy, unspecified: Secondary | ICD-10-CM

## 2014-11-11 DIAGNOSIS — E1142 Type 2 diabetes mellitus with diabetic polyneuropathy: Secondary | ICD-10-CM

## 2014-11-11 DIAGNOSIS — R234 Changes in skin texture: Secondary | ICD-10-CM

## 2014-11-11 DIAGNOSIS — M2042 Other hammer toe(s) (acquired), left foot: Secondary | ICD-10-CM

## 2014-11-12 ENCOUNTER — Encounter: Payer: Self-pay | Admitting: Primary Podiatric Medicine

## 2014-11-12 NOTE — Progress Notes (Signed)
Filed Vitals:    11/11/14 1550   BP: 156/93   Pulse: 93   Temp: 98.2 F (36.8 C)       Active Ambulatory Problems     Diagnosis Date Noted   . No Active Ambulatory Problems     Resolved Ambulatory Problems     Diagnosis Date Noted   . No Resolved Ambulatory Problems     Past Medical History   Diagnosis Date   . Anxiety    . Depression    . Arthritis    . Irritable bowel syndrome    . Obesity    . Fatty liver    . White coat hypertension    . Diabetes mellitus    . Cellulitis         Reviewed vital signs, past medical history, social history, family history, medications and allergies with patient.    1. Type 2 diabetes mellitus with diabetic polyneuropathy     2. Fissure in skin of foot     3. Hammer toes of both feet         SUBJECTIVE: The patient is a 49 year old female with a history  of diabetes, irritable bowel syndrome, and anxiety who is seen  for followup due to fissure and cellulitis over this left lower  extremity.  The patient relates this continues to improve and  this has remained healed since her last visit without any  drainage.  The patient continues to deny any fever, chills,  nausea, and vomiting today.  The patient relates that she just  recently returned to work and has done so without difficulty at  this point.  The patient returns to clinic for re-examination of  this site.    OBJECTIVE FINDINGS: The patient with warm, dry, and supple skin  over this lower extremity.  The patient with this fissure  location at the left fifth digit, which is resolved at this  point and it is healed.  There continues to be hyperkeratotic  lesion on the plantar aspect of this foot, which upon  debridement does show some subdermal hematoma, but it does  appear stable and closed at this point.  The patient with  complete resolvement of this cellulitis.  The patient with  palpable pulses and capillary refill time less than 3 seconds.  Denies claudication pain.  The patient with decreased protective  sensation to the  midfoot with monofilament.  Does complain of  occasional burning and tingling, but denies Tinel's sign or  Mulder's sign.  The patient with hammer digit syndrome 2 through  5 bilaterally.    ASSESSMENT:  1.  Diabetes with neuropathy.  2.  Resolved cellulitis and fissure, left fifth digit.    PLAN: At this point, I advised the patient to return to work as  tolerated.  Advised to continue to monitor this area closely and  to call should she develop any drainage from this left foot.  Advised to follow up in 3-6 months for re-examination.

## 2014-11-17 NOTE — Discharge Summary (Signed)
[facility]  [place], [place] [address]    DISCHARGE SUMMARY      PATIENT NAMELisanne, [name]  MRN:  [id number]  DOB:  December 01, 1965    ADMISSION DATE:  10/24/2014  DISCHARGE DATE:  10/27/2014    ATTENDING PHYSICIAN: Eyvonne Mechanic, MD  PRIMARY CARE PHYSICIAN: Candee Furbish, MD     ADMISSION DIAGNOSIS: Diabetic foot infection  Chief Complaint   Patient presents with    Foot Pain       DISCHARGE DIAGNOSIS:   Hospital Problems) (* Primary Problem)    Diagnosis Date Noted    *Diabetic foot infection 10/24/2014    Type 2 diabetes mellitus 10/25/2014    Cellulitis 10/25/2014      Resolved Hospital Problems    Diagnosis Date Noted Date Resolved   No resolved problems to display.     There are no active non-hospital problems to display for this patient.     DISCHARGE MEDICATIONS:     Current Discharge Medication List      START taking these medications.       Details    bacitracin zinc 500 unit/gram Ointment    Apply Topically, 2 TIMES DAILY   Refills:  0       Urea 10 % Cream    Topical, 2 TIMES DAILY, apply to affected area as directed and cover with saran wrap nightly   Qty:  1 Tube   Refills:  0         CONTINUE these medications which have CHANGED during your visit.       Details    ALPRAZolam 0.25 mg Tablet   Commonly known as:  XANAX   What changed:  when to take this    0.25 mg, Oral, NIGHTLY PRN   Qty:  12 Tab   Refills:  0         CONTINUE these medications - NO CHANGES were made during your visit.       Details    cephalexin 500 mg Capsule   Commonly known as:  KEFLEX    500 mg, Oral, 3 TIMES DAILY   Qty:  21 Cap   Refills:  0       CLARITIN 10 mg Tablet   Generic drug:  loratadine    10 mg, Oral, DAILY PRN   Refills:  0       glipiZIDE 5 mg Tablet   Commonly known as:  GLUCOTROL    5 mg, Oral, 2 TIMES DAILY BEFORE MEALS, Take 30 minutes before meals    Refills:  0       Losartan-Hydrochlorothiazide 50-12.5 mg Tablet    1 Tab, Oral, DAILY   Refills:  0         STOP taking  these medications.          Ibuprofen 800 mg Tablet   Commonly known as:  MOTRIN       IMODIUM A-D 2 mg Tablet   Generic drug:  Loperamide       ketoconazole 2 % Cream   Commonly known as:  NIZORAL           DISCHARGE INSTRUCTIONS:   No discharge procedures on file.        Follow-up Information     Follow up with Delanoy, Marcie Bal, MD. Schedule an appointment as soon as possible for a visit in 3 days.    Specialty:  Family Practice    Why:  YOU  HAVE APPT. TUES.FEB.9TH AT 9/45AM    Contact information:    [address]  [address]  [facility] [address]  [phone]          Schedule an appointment as soon as possible for a visit to follow up.        REASON FOR HOSPITALIZATION AND HOSPITAL COURSE:  This is a 49 y.o., female admitted for left foot infection by hospitalist    SIGNIFICANT PHYSICAL FINDINGS: Large crack under 5th digit on plantar surface with surrounding cellulitis  SIGNIFICANT LAB: Elev glucose  SIGNIFICANT RADIOLOGY: MRI neg for osteomyelitis  CONSULTATIONS: Dr. Dina Rich  PROCEDURES PERFORMED: None        COURSE IN HOSPITAL: The patient had failed outpatient management. She was cultured and started on IV antibiotics.  Cultures remained negative.  She improved dramatically at 48 hours. She was seen by podiatry who recommended certain regimen to heal crack in plantar surface.  Patient preferred to find podiatrist in [facility]. She was much improved and discharged on oral antibiotic and to followup with PCP.    DOES PATIENT HAVE ADVANCED DIRECTIVES:  No, Information Offered and Given    ADVANCED CARE PLANNING - POST form not discussed    CONDITION ON DISCHARGE: Alert, Oriented and VS Stable    DISCHARGE DISPOSITION:  Home discharge     Copies sent to Care Team       Relationship Specialty Notifications Start End    Candee Furbish, MD PCP - General Family Practice  10/24/14     Phone: [phone] Fax: [phone]         [address] [place] [address]

## 2014-11-25 ENCOUNTER — Encounter: Payer: Self-pay | Admitting: Primary Podiatric Medicine

## 2014-11-25 ENCOUNTER — Ambulatory Visit: Payer: PRIVATE HEALTH INSURANCE | Admitting: Primary Podiatric Medicine

## 2014-11-25 ENCOUNTER — Ambulatory Visit (INDEPENDENT_AMBULATORY_CARE_PROVIDER_SITE_OTHER): Payer: PRIVATE HEALTH INSURANCE | Admitting: Primary Podiatric Medicine

## 2014-11-25 VITALS — BP 181/105 | HR 89 | Temp 98.0°F | Wt 229.0 lb

## 2014-11-25 DIAGNOSIS — L97421 Non-pressure chronic ulcer of left heel and midfoot limited to breakdown of skin: Secondary | ICD-10-CM

## 2014-11-25 DIAGNOSIS — E1142 Type 2 diabetes mellitus with diabetic polyneuropathy: Secondary | ICD-10-CM

## 2014-11-25 DIAGNOSIS — R234 Changes in skin texture: Secondary | ICD-10-CM

## 2014-11-25 DIAGNOSIS — E1140 Type 2 diabetes mellitus with diabetic neuropathy, unspecified: Secondary | ICD-10-CM

## 2014-11-25 MED ORDER — LEVOFLOXACIN 500 MG PO TABS
500.0000 mg | ORAL_TABLET | Freq: Every day | ORAL | Status: AC
Start: 2014-11-25 — End: 2014-12-02

## 2014-11-25 MED ORDER — SILVER SULFADIAZINE 1 % EX CREA
TOPICAL_CREAM | Freq: Every day | CUTANEOUS | Status: DC
Start: 2014-11-25 — End: 2016-06-19

## 2014-11-27 ENCOUNTER — Encounter: Payer: Self-pay | Admitting: Primary Podiatric Medicine

## 2014-11-27 NOTE — Progress Notes (Signed)
Filed Vitals:    11/25/14 1707   BP: 181/105   Pulse: 89   Temp: 98 F (36.7 C)       Active Ambulatory Problems     Diagnosis Date Noted   . No Active Ambulatory Problems     Resolved Ambulatory Problems     Diagnosis Date Noted   . No Resolved Ambulatory Problems     Past Medical History   Diagnosis Date   . Anxiety    . Depression    . Arthritis    . Irritable bowel syndrome    . Obesity    . Fatty liver    . White coat hypertension    . Diabetes mellitus    . Cellulitis         Reviewed vital signs, past medical history, social history, family history, medications and allergies with patient.    1. Type 2 diabetes mellitus with diabetic polyneuropathy     2. Ulcer of heel and midfoot, left, limited to breakdown of skin         SUBJECTIVE: The patient is a 49 year old female with a history  of depression and diabetes and previous treatment for ulceration  and cellulitis of this right foot.  This has resolved at this  point.  The patient presents with new complaints of lesion on  the lateral aspect of this left foot.  The patient relates that  she has some  area and when she went to pull it off, she  developed a fissure in the skin at this level.  She denies any  fever, chills, nausea, and vomiting, but  does have some marked  increased redness and tenderness at this shoe site.  The patient  is resting comfortably with no painful distress.    OBJECTIVE FINDINGS: I reviewed vitals today.  The patient is  with warm, dry, and supple skin over this lower extremity.  The  patient is with this new fissure and ulceration on the plantar  and lateral aspect of this left foot measuring approximately 1.4  x 0.4 x 0.2 cm.  It is granular wound base.  No  mild  periwound erythema extending to the dorsal aspect of this wound  site.  The patient has palpable pulses and capillary refill time  less than 3 seconds.  Denies Tinel sign and Mulder sign.  Does  have decreased protective sensation to this midfoot bilaterally  with  monofilament.  The patient is with some tenderness over  this ulceration site today.    ASSESSMENT:  1.  Diabetes with neuropathy.  2.  Ulceration and fissure site on this left foot.    PLAN: Due to this erythema as well as increased tenderness, we  will start the patient on a short course of Levaquin.  I advised  the patient to use the surgical shoe to help offload this area  as much as possible.  We will order Silvadene for the patient to  apply daily to this site.  Advised the patient to take the next  couple of days off work to allow this area to heal and if it was  improving, she can return on Monday.  Advised to call with any  worsening concerns or complaints.

## 2014-11-29 ENCOUNTER — Inpatient Hospital Stay (HOSPITAL_BASED_OUTPATIENT_CLINIC_OR_DEPARTMENT_OTHER)
Admission: EM | Admit: 2014-11-29 | Discharge: 2014-12-07 | DRG: 603 | Disposition: A | Discharge status: Home / Self Care | Payer: No Typology Code available for payment source

## 2014-11-29 ENCOUNTER — Emergency Department (HOSPITAL_BASED_OUTPATIENT_CLINIC_OR_DEPARTMENT_OTHER): Payer: No Typology Code available for payment source

## 2014-11-29 ENCOUNTER — Encounter (HOSPITAL_BASED_OUTPATIENT_CLINIC_OR_DEPARTMENT_OTHER): Payer: Self-pay

## 2014-11-29 DIAGNOSIS — Z789 Other specified health status: Secondary | ICD-10-CM

## 2014-11-29 DIAGNOSIS — F329 Major depressive disorder, single episode, unspecified: Secondary | ICD-10-CM | POA: Diagnosis present

## 2014-11-29 DIAGNOSIS — Z9851 Tubal ligation status: Secondary | ICD-10-CM

## 2014-11-29 DIAGNOSIS — E669 Obesity, unspecified: Secondary | ICD-10-CM | POA: Diagnosis present

## 2014-11-29 DIAGNOSIS — I10 Essential (primary) hypertension: Secondary | ICD-10-CM | POA: Diagnosis present

## 2014-11-29 DIAGNOSIS — F419 Anxiety disorder, unspecified: Secondary | ICD-10-CM | POA: Diagnosis present

## 2014-11-29 DIAGNOSIS — A0472 Enterocolitis due to Clostridium difficile, not specified as recurrent: Secondary | ICD-10-CM | POA: Diagnosis not present

## 2014-11-29 DIAGNOSIS — I878 Other specified disorders of veins: Secondary | ICD-10-CM

## 2014-11-29 DIAGNOSIS — L03116 Cellulitis of left lower limb: Principal | ICD-10-CM | POA: Diagnosis present

## 2014-11-29 DIAGNOSIS — A047 Enterocolitis due to Clostridium difficile: Secondary | ICD-10-CM | POA: Diagnosis present

## 2014-11-29 DIAGNOSIS — E119 Type 2 diabetes mellitus without complications: Secondary | ICD-10-CM | POA: Diagnosis present

## 2014-11-29 DIAGNOSIS — Z6835 Body mass index (BMI) 35.0-35.9, adult: Secondary | ICD-10-CM

## 2014-11-29 LAB — CBC
BASOPHIL #: 0 10*3/uL (ref 0.0–0.1)
BASOPHILS %: 0.4 % (ref 0.0–2.5)
EOSINOPHIL #: 0.1 10*3/uL (ref 0.0–0.5)
EOSINOPHIL %: 1 % (ref 0.0–5.2)
HCT: 36.5 % (ref 36.0–45.0)
HGB: 12.8 g/dL (ref 12.0–15.5)
LYMPHOCYTE #: 2 K/uL (ref 0.7–3.2)
LYMPHOCYTE %: 26.8 % (ref 15.0–43.0)
MCH: 29 pg (ref 28.3–34.3)
MCHC: 35 g/dL (ref 32.0–36.0)
MCV: 82.9 fL (ref 82.0–97.0)
MONOCYTE #: 0.4 K/uL (ref 0.2–0.9)
MONOCYTE %: 5.3 % (ref 4.8–12.0)
MPV: 8.2 fL (ref 7.4–10.5)
NRBC ABSOLUTE: 0 10*3/uL (ref 0–0.02)
NRBC: 0 /100{WBCs} (ref 0–0.3)
PLATELET COUNT: 251 10*3/uL — AB (ref 150–450)
PMN #: 5.1 K/uL (ref 1.5–6.5)
PMN %: 66.5 % (ref 43.0–76.0)
RBC: 4.4 M/uL (ref 4.00–5.10)
RDW: 13.9 % (ref 10.5–14.5)
WBC: 7.6 10*3/uL (ref 4.0–11.0)

## 2014-11-29 LAB — BASIC METABOLIC PROFILE - BMC/JMC ONLY
ANION GAP: 6 mmol/L (ref 3–11)
BUN: 17 mg/dL (ref 6–22)
BUN: 17 mg/dL (ref 6–22)
CALCIUM: 9.4 mg/dL (ref 8.5–10.5)
CARBON DIOXIDE: 30 mmol/L (ref 22–32)
CHLORIDE: 103 mmol/L (ref 101–111)
CREATININE: 0.95 mg/dL (ref 0.53–1.00)
ESTIMATED GLOMERULAR FILTRATION RATE: >60 mL/min (ref >60)
GLUCOSE: 105 mg/dL (ref 70–110)
POTASSIUM: 3.8 mmol/L (ref 3.5–5.0)
POTASSIUM: 3.8 mmol/L (ref 3.5–5.0)
SODIUM: 139 mmol/L (ref 136–145)

## 2014-11-29 LAB — C-REACTIVE PROTEIN(CRP),INFLAMMATION: C-REACTIVE PROTEIN (CRP),INFLAMMATION: 1.4 mg/L — AB (ref 0.0–8.0)

## 2014-11-29 MED ORDER — SODIUM CHLORIDE 0.9 % (FLUSH) INJECTION SYRINGE
10.00 mL | INJECTION | INTRAMUSCULAR | Status: DC | PRN
Start: 2014-11-29 — End: 2014-12-01

## 2014-11-29 MED ORDER — SODIUM CHLORIDE 0.9 % (FLUSH) INJECTION SYRINGE
10.00 mL | INJECTION | Freq: Three times a day (TID) | INTRAMUSCULAR | Status: DC
Start: 2014-11-29 — End: 2014-12-01
  Administered 2014-11-29 – 2014-11-30 (×3): 10 mL via INTRAVENOUS
  Administered 2014-11-30: 0 mL via INTRAVENOUS
  Administered 2014-12-01 (×2): 10 mL via INTRAVENOUS

## 2014-11-29 NOTE — ED Nurses Note (Signed)
Report to janet rn on 4th floor

## 2014-11-29 NOTE — ED Provider Notes (Signed)
Drucie Ip, PA-C  [facility]   Emergency Department  [place], [place] [address]    Date of service:  11/30/2014     CC:   Chief Complaint   Patient presents with    Foot Pain         Date: 11/30/2014  Primary care provider: Candee Furbish, MD  Means of arrival: private car  History obtained by: patient  History limited by: none    HPI  [name], date of birth April 29, 1966,  is a 49 y.o. female who presents to the ED with c/o left foot infection.  States about a month ago developed cellulitis left foot.  Was admitted for IV ABX's x 4 days.  Was discharged on oral ABX and was doing well.  6-7 days ago noticed a spot of redness returning.  Went to Dr. Raquel Sarna in [facility] 11/25/2014 and he started her on Levaquin and silvadene.  patient taking meds as s prescribed but redness is spreading.  Mild pain.  Trying to keep foot elevated and applying warm compresses.  States associated tightness in foot and calf.  No hx of blood clots.  denies fevers, chills, nausea, vomiting, fatigue. Diabetic.   Glucose well controlled.          Review of Systems    The pertinent positives and negatives are as per HPI.  All other systems reviewed and are negative.    I reviewed and confirmed the patient's past medical history taken by the nurse or medical assistant  History:   Meds:   Current Outpatient Prescriptions   Medication Sig    ALPRAZolam (XANAX) 0.25 mg Oral Tablet Take 1 Tab (0.25 mg total) by mouth Every night as needed for Anxiety (Patient taking differently: Take 0.25 mg by mouth Twice per day as needed for Anxiety )    glipiZIDE (GLUCOTROL) 5 mg Oral Tablet Take 2.5 mg by mouth Twice a day before meals Take 30 minutes before meals    loratadine (CLARITIN) 10 mg Oral Tablet Take 10 mg by mouth Once per day as needed     Losartan-Hydrochlorothiazide 50-12.5 mg Oral Tablet Take 1 Tab by mouth Once a day    silver sulfADIAZINE (SILVADENE) 1 % Cream by Apply Topically route Once a day          PMH:    Past Medical History   Diagnosis Date    Anxiety     Depression     Arthritis     Irritable bowel syndrome     Obesity     Fatty liver     White coat hypertension     Diabetes mellitus     Cellulitis 10/24/14     left foot    Headache(784.0)     Wears glasses     Macular edema          PSH:    Past Surgical History   Procedure Laterality Date    Hx gall bladder surgery/chole  09/10/96    Hx other  1996     Hymenectomy    Hx pelvic laparoscopy  05/24/98     Ectopic pregnancy    Hx tubal ligation  09/10/2012     Filshie Clips         Social Hx:    History     Social History    Marital Status: Married     Spouse Name: N/A     Number of Children: N/A  Years of Education: N/A     Occupational History    Not on file.     Social History Main Topics    Smoking status: Never Smoker     Smokeless tobacco: Not on file    Alcohol Use: No    Drug Use: No    Sexual Activity: Not on file     Other Topics Concern    Ability To Walk 2 Flight Of Steps Without Sob/Cp Yes    Ability To Do Own Adl's Yes     Social History Narrative     Family Hx:   Family History   Problem Relation Age of Onset    Hypertension Maternal Uncle     Cancer Maternal Grandmother      Allergies:   Allergies   Allergen Reactions    Vicodin [Hydrocodone-Acetaminophen] Itching       Above history reviewed with patient, changes are as documented.    Physical Exam   Nursing notes reviewed.    ED Triage Vitals   Enc Vitals Group      BP (Non-Invasive) 11/29/14 2011 157/108 mmHg      Heart Rate 11/29/14 2011 83      Respiratory Rate 11/29/14 2011 18      Temperature 11/29/14 2011 36.8 C (98.3 F)      Temp src --       SpO2-1 11/29/14 2011 99 %      Weight 11/29/14 2011 105.144 kg (231 lb 12.8 oz)      Height 11/29/14 2011 1.702 m (5' 7.01")      Head Cir --       Peak Flow --       Pain Score --       Pain Loc --       Pain Edu? --       Excl. in [place]? --          Constitutional: NAD. Oriented.  Obese female  HEENT:   Head:  Normocephalic and atraumatic.   Mouth/Throat: Oropharynx is clear and moist.   Eyes  PERRL, no scleral icterus  Neck: Trachea midline. Neck supple. No adenopathy  Cardiovascular: RRR, No murmurs, rubs or gallops heard. Intact distal pulses. Cap refill < 2 secs  Pulmonary/Chest: BS equal bilaterally. No respiratory distress. No wheezes, rales or chest tenderness.   Abdominal: BS +. Abdomen soft, no tenderness, rebound or guarding.   No appreciable pulsatile mass            Musculoskeletal:  left foot - mid-distal 4th and 5th metatarsals and lateral aspect of foot with  erythema, warmth and tenderness extending to 5th digit.  ROM intact. Distal planter aspect with skin cracks.  5/5 motor exam.,  Moves spine and all extremities well  Skin: warm and dry. No rash, erythema, pallor or cyanosis  Psychiatric: normal mood and affect. Behavior is normal. Normal insight.  sensation intact.       Course  MDM      Plan:       Medical Records reviewed. Will obtain the following labs/imaging and give pt the following medications to alleviate symptoms:    Orders Placed This Encounter    US VENOUS DOPPLER LOWER EXTREMITY LEFT    XR FOOT LEFT SERIES    CBC    Basic Metabolic Profile    C-Reactive Protein (CRP), Inflammation    INSERT & MAINTAIN PERIPHERAL IV ACCESS    NS flush syringe    NS flush syringe  Results for orders placed or performed during the hospital encounter of 11/29/14 (from the past 12 hour(s))   CBC   Result Value Ref Range    WBC 7.6 4.0 - 11.0 K/uL    RBC 4.40 4.00 - 5.10 M/uL    HGB 12.8 12.0 - 15.5 g/dL    HCT 36.5 36.0 - 45.0 %    MCV 82.9 82.0 - 97.0 fL    MCH 29.0 28.3 - 34.3 pg    MCHC 35.0 32.0 - 36.0 g/dL    RDW 13.9 10.5 - 14.5 %    PLATELET COUNT 251 (D) 150 - 450 K/uL    MPV 8.2 7.4 - 10.5 fL    NRBC 0 0 - 0.3 /100 WBC    NRBC ABSOLUTE 0.00 0 - 0.02 K/uL    PMN % 66.5 43.0 - 76.0 %    LYMPHOCYTE % 26.8 15.0 - 43.0 %    MONOCYTE % 5.3 4.8 - 12.0 %    EOSINOPHIL % 1.0 0.0 - 5.2 %    BASOPHILS  % 0.4 0.0 - 2.5 %    PMN # 5.1 1.5 - 6.5 K/uL    LYMPHOCYTE # 2.0 0.7 - 3.2 K/uL    MONOCYTE # 0.4 0.2 - 0.9 K/uL    EOSINOPHIL # 0.1 0.0 - 0.5 K/uL    BASOPHIL # 0.0 0.0 - 0.1 K/uL   BASIC METABOLIC PROFILE - [facility] ONLY   Result Value Ref Range    GLUCOSE 105 70 - 110 mg/dL    BUN 17 6 - 22 mg/dL    CREATININE 0.95 0.53 - 1.00 mg/dL    ESTIMATED GLOMERULAR FILTRATION RATE >60 >60 ml/min    SODIUM 139 136 - 145 mmol/L    POTASSIUM 3.8 3.5 - 5.0 mmol/L    CHLORIDE 103 101 - 111 mmol/L    CARBON DIOXIDE 30 22 - 32 mmol/L    ANION GAP 6 3 - 11 mmol/L    CALCIUM 9.4 8.5 - 10.5 mg/dL   C-REACTIVE PROTEIN(CRP),INFLAMMATION   Result Value Ref Range    C-REACTIVE PROTEIN HIGH SENSITIVITY (INFLAMMATION) 1.4 (D) 0.0 - 8.0 mg/L     All labs were reviewed.  No emergent findings    Radiographic Imaging:   [place]- per radiology -  Negative/normal    Foot series per review with Dr. Althea Grimmer - no acute findings.  Radiology read pending.       Course:     Nursing notes reviewed    cellulitis marked with skin marker.    Case discussed with Attending physician Dr. Althea Grimmer who reviewed labs and studies and agreed with treatment and plan.    Re-exam- patient resting without complaints.  Has failed outpatient treatment. Agreeable to admission.    10:40 PM - Decision made to admit the patient. Paged Dr. Christa See  11:09 PM: I discussed the patient's case and above findings with Dr. Christa See  who is making arrangements to keep the patient in hospital for further workup and treatment at this time.        Filed Vitals:    11/29/14 2315 11/29/14 2330 11/29/14 2345 11/30/14 0021   BP:  152/85  151/89   Pulse:    85   Temp:    36.5 C (97.7 F)   Resp:    18   SpO2: 99% 99% 100% 100%             Clinical Impression:      Encounter Diagnoses  Name Primary?    Cellulitis of left foot Yes    Type 2 diabetes mellitus        Disposition: Admitted  Condition  Stable

## 2014-11-29 NOTE — ED Nurses Note (Signed)
Pt states she has cellulitis in her left foot.  States foot is red and seen by Podiatrist on Wednesday.

## 2014-11-29 NOTE — H&P (Signed)
[facility]  [place], [place] [address]    General History and Physical    [name]  Date of Admission:  11/29/2014  Date of Birth:  1966-05-18    PCP: Candee Furbish, MD  Chief Complaint:  Left foot infection.    HPI: [name] is a 49 y.o., White female who presents with Left foot redness and pain going on for a week now.  She was seen by a foot doctor 4 days ago and was given Levaquin and Silvadene cream.  In spite of using both, the redness and pain in the left foot is spreading and getting worse.  She is having subjective fevers at home.  Denies nausea, vomiting, headache, shortness of breath, cough, chest pain, no other complaints.        Active Hospital Problems   (*Primary Problem)    Diagnosis    *Cellulitis of foot, left    Type 2 diabetes mellitus     Chronic       Past Medical History   Diagnosis Date    Anxiety     Depression     Arthritis     Irritable bowel syndrome     Obesity     Fatty liver     White coat hypertension     Diabetes mellitus     Cellulitis 10/24/14     left foot    Headache(784.0)     Wears glasses     Macular edema        Past Surgical History   Procedure Laterality Date    Hx gall bladder surgery/chole  09/10/96    Hx other  1996     Hymenectomy    Hx pelvic laparoscopy  05/24/98     Ectopic pregnancy    Hx tubal ligation  09/10/2012     Filshie Clips       Medications Prior to Admission     Prescriptions    ALPRAZolam (XANAX) 0.25 mg Oral Tablet    Take 1 Tab (0.25 mg total) by mouth Every night as needed for Anxiety    Patient taking differently:  Take 0.25 mg by mouth Twice per day as needed for Anxiety     glipiZIDE (GLUCOTROL) 5 mg Oral Tablet    Take 2.5 mg by mouth Twice a day before meals Take 30 minutes before meals    loratadine (CLARITIN) 10 mg Oral Tablet    Take 10 mg by mouth Once per day as needed     Losartan-Hydrochlorothiazide 50-12.5 mg Oral Tablet    Take 1 Tab by mouth Once a day    silver sulfADIAZINE  (SILVADENE) 1 % Cream    by Apply Topically route Once a day    bacitracin zinc 500 unit/gram Ointment    by Apply Topically route Twice daily    cephalexin (KEFLEX) 500 mg Oral Capsule    Take 1 Cap (500 mg total) by mouth Three times a day    Urea 10 % Cream    Apply topically Twice daily apply to affected area as directed and cover with saran wrap nightly          Current Facility-Administered Medications:  NS flush syringe 10 mL Intravenous Q8HRS   NS flush syringe 10 mL Intravenous Q1H PRN       Allergies   Allergen Reactions    Vicodin [Hydrocodone-Acetaminophen] Itching       History   Substance Use Topics    Smoking  status: Never Smoker     Smokeless tobacco: Not on file    Alcohol Use: No       Family History   Problem Relation Age of Onset    Hypertension Maternal Uncle     Cancer Maternal Grandmother        ROS: Other than ROS in the HPI, all other systems were negative.    DNR Status:  Prior    EXAM:  Temperature: 36.8 C (98.3 F)  Heart Rate: 83  BP (Non-Invasive): 140/69 mmHg  Respiratory Rate: 18  SpO2-1: 100 %  Pain Score (Numeric, Faces): 5  General: appears in good health. No distress. Obese+  Eyes: Pupils equal and round, reactive to light and accomodation.   HEENT: Head atraumatic and normocephalic   Neck: No JVD or thyromegaly or lymphadenopathy   Lungs: Clear to auscultation bilaterally.   Cardiovascular: regular rate and rhythm, S1, S2 normal, no murmur  Abdomen: Soft, non-tender, Bowel sounds normal, No hepatosplenomegaly   Extremities: extremities normal, atraumatic, no cyanosis or edema. Erythema, warmth and tenderness left distal foot.    Skin: Skin warm and dry   Neurologic: Grossly normal   Lymphatics: No lymphadenopathy   Psychiatric: Normal affect, behavior,         Labs:    Lab Results for Last 24 Hours:    Results for orders placed or performed during the hospital encounter of 11/29/14 (from the past 24 hour(s))   CBC   Result Value Ref Range    WBC 7.6 4.0 - 11.0 K/uL    RBC  4.40 4.00 - 5.10 M/uL    HGB 12.8 12.0 - 15.5 g/dL    HCT 36.5 36.0 - 45.0 %    MCV 82.9 82.0 - 97.0 fL    MCH 29.0 28.3 - 34.3 pg    MCHC 35.0 32.0 - 36.0 g/dL    RDW 13.9 10.5 - 14.5 %    PLATELET COUNT 251 (D) 150 - 450 K/uL    MPV 8.2 7.4 - 10.5 fL    NRBC 0 0 - 0.3 /100 WBC    NRBC ABSOLUTE 0.00 0 - 0.02 K/uL    PMN % 66.5 43.0 - 76.0 %    LYMPHOCYTE % 26.8 15.0 - 43.0 %    MONOCYTE % 5.3 4.8 - 12.0 %    EOSINOPHIL % 1.0 0.0 - 5.2 %    BASOPHILS % 0.4 0.0 - 2.5 %    PMN # 5.1 1.5 - 6.5 K/uL    LYMPHOCYTE # 2.0 0.7 - 3.2 K/uL    MONOCYTE # 0.4 0.2 - 0.9 K/uL    EOSINOPHIL # 0.1 0.0 - 0.5 K/uL    BASOPHIL # 0.0 0.0 - 0.1 K/uL   BASIC METABOLIC PROFILE - [facility] ONLY   Result Value Ref Range    GLUCOSE 105 70 - 110 mg/dL    BUN 17 6 - 22 mg/dL    CREATININE 0.95 0.53 - 1.00 mg/dL    ESTIMATED GLOMERULAR FILTRATION RATE >60 >60 ml/min    SODIUM 139 136 - 145 mmol/L    POTASSIUM 3.8 3.5 - 5.0 mmol/L    CHLORIDE 103 101 - 111 mmol/L    CARBON DIOXIDE 30 22 - 32 mmol/L    ANION GAP 6 3 - 11 mmol/L    CALCIUM 9.4 8.5 - 10.5 mg/dL   C-REACTIVE PROTEIN(CRP),INFLAMMATION   Result Value Ref Range    C-REACTIVE PROTEIN HIGH SENSITIVITY (INFLAMMATION) 1.4 (D) 0.0 - 8.0  mg/L       Imaging Studies:  -    US VENOUS DOPPLER LOWER EXTREMITY LEFT. No acute finding. Prelim.   XR FOOT LEFT SERIES. No osteomyelitis. Prelim.     DVT RISK FACTORS HAVE BEN ASSESSED AND PROPHYLAXIS ORDERED (SEE RUBYONLINE - REFERENCE TOOLS - MD, DVT PROPHY OR POCKET CARD)    Assessment/Plan:     1.  Left foot cellulitis.  Start the patient on intravenous clindamycin.  The patient failed outpatient treatment of Levaquin.  2.  Type 2 diabetes, continue glipizide start the patient on sliding scale coverage.  3.  Hypertension, controlled with Losartan/hydrochlorothiazide.  Will continue that with holding parameters.  4.  Obesity, body mass index is 36.  Will counsel the patient to lose weight.      DVT and GI prophylaxis. Lovenox and protonix.  Code status.  Full code.        Portions of this note may be dictated using voice recognition software or a dictation service. Variances in spelling and vocabulary are possible and unintentional. Not all errors are caught/corrected. Please notify the [facility] if any discrepancies are noted or if the meaning of any statement is not clear.

## 2014-11-30 ENCOUNTER — Encounter (RURAL_HEALTH_CENTER): Payer: Self-pay

## 2014-11-30 DIAGNOSIS — I10 Essential (primary) hypertension: Secondary | ICD-10-CM | POA: Diagnosis present

## 2014-11-30 DIAGNOSIS — E669 Obesity, unspecified: Secondary | ICD-10-CM | POA: Diagnosis present

## 2014-11-30 LAB — PERFORM POC FINGERSTICK GLUCOSE
BLD GLUCOSE POCT: 100 mg/dL (ref 60–100)
BLD GLUCOSE POCT: 101 mg/dL — ABNORMAL HIGH (ref 60–100)
BLD GLUCOSE POCT: 105 mg/dL — ABNORMAL HIGH (ref 60–100)
BLD GLUCOSE POCT: 86 mg/dL (ref 60–100)
BLD GLUCOSE POCT: 97 mg/dL (ref 60–100)

## 2014-11-30 LAB — CBC
BASOPHIL #: 0 10*3/uL (ref 0.0–0.1)
BASOPHILS %: 0.5 % (ref 0.0–2.5)
EOSINOPHIL #: 0.1 10*3/uL (ref 0.0–0.5)
EOSINOPHIL %: 1.3 % (ref 0.0–5.2)
HCT: 33.1 % — ABNORMAL LOW (ref 36.0–45.0)
HGB: 11.5 g/dL — ABNORMAL LOW (ref 12.0–15.5)
LYMPHOCYTE #: 2.1 10*3/uL (ref 0.7–3.2)
LYMPHOCYTE %: 32.4 % (ref 15.0–43.0)
MCH: 29.1 pg (ref 28.3–34.3)
MCHC: 34.9 g/dL (ref 32.0–36.0)
MCV: 83.4 fL (ref 82.0–97.0)
MONOCYTE #: 0.5 10*3/uL (ref 0.2–0.9)
MONOCYTE %: 8 % (ref 4.8–12.0)
MPV: 8.6 fL (ref 7.4–10.5)
NRBC ABSOLUTE: 0 K/uL (ref 0–0.02)
NRBC: 0 /100{WBCs} (ref 0–0.3)
PLATELET COUNT: 218 10*3/uL (ref 150–450)
PMN #: 3.7 K/uL (ref 1.5–6.5)
PMN %: 57.8 % (ref 43.0–76.0)
RBC: 3.97 M/uL — ABNORMAL LOW (ref 4.00–5.10)
RDW: 13.8 % (ref 10.5–14.5)
WBC: 6.4 K/uL (ref 4.0–11.0)

## 2014-11-30 LAB — BASIC METABOLIC PROFILE - BMC/JMC ONLY
ANION GAP: 3 mmol/L (ref 3–11)
BUN: 19 mg/dL (ref 6–22)
CALCIUM: 9 mg/dL (ref 8.5–10.5)
CARBON DIOXIDE: 28 mmol/L (ref 22–32)
CHLORIDE: 106 mmol/L (ref 101–111)
CREATININE: 0.85 mg/dL (ref 0.53–1.00)
ESTIMATED GLOMERULAR FILTRATION RATE: >60 mL/min (ref >60)
GLUCOSE: 105 mg/dL (ref 70–110)
POTASSIUM: 3.8 mmol/L (ref 3.5–5.0)
SODIUM: 137 mmol/L (ref 136–145)

## 2014-11-30 MED ORDER — INSULIN LISPRO 100 UNIT/ML INJECTION SSIP - CITY
2.00 [IU] | Freq: Four times a day (QID) | SUBCUTANEOUS | Status: DC
Start: 2014-11-30 — End: 2014-12-07
  Administered 2014-11-30 – 2014-12-07 (×30): 0 [IU] via SUBCUTANEOUS
  Filled 2014-11-30: qty 300

## 2014-11-30 MED ORDER — DEXTROSE 50 % IN WATER (D50W) INTRAVENOUS SYRINGE
12.50 g | INJECTION | INTRAVENOUS | Status: DC | PRN
Start: 2014-11-30 — End: 2014-12-07

## 2014-11-30 MED ORDER — LOSARTAN 25 MG TABLET
50.0000 mg | ORAL_TABLET | Freq: Every day | ORAL | Status: DC
Start: 2014-11-30 — End: 2014-12-03
  Administered 2014-11-30 – 2014-12-03 (×4): 50 mg via ORAL
  Filled 2014-11-30 (×4): qty 2

## 2014-11-30 MED ORDER — ALPRAZOLAM 0.25 MG TABLET
0.25 mg | ORAL_TABLET | Freq: Two times a day (BID) | ORAL | Status: DC | PRN
Start: 2014-11-30 — End: 2014-12-07
  Filled 2014-11-30 (×4): qty 1

## 2014-11-30 MED ORDER — SODIUM CHLORIDE 0.9 % (FLUSH) INJECTION SYRINGE
10.0000 mL | INJECTION | Freq: Three times a day (TID) | INTRAMUSCULAR | Status: DC
Start: 2014-11-30 — End: 2014-12-07
  Administered 2014-11-30: 0 mL via INTRAVENOUS
  Administered 2014-11-30 – 2014-12-01 (×4): 10 mL via INTRAVENOUS
  Administered 2014-12-01 (×2): 0 mL via INTRAVENOUS
  Administered 2014-12-02 (×3): 10 mL via INTRAVENOUS
  Administered 2014-12-02: 0 mL via INTRAVENOUS
  Administered 2014-12-03: 10 mL via INTRAVENOUS
  Administered 2014-12-03: 0 mL via INTRAVENOUS
  Administered 2014-12-03: 10 mL via INTRAVENOUS
  Administered 2014-12-04: 0 mL via INTRAVENOUS
  Administered 2014-12-04 – 2014-12-05 (×6): 10 mL via INTRAVENOUS
  Administered 2014-12-05: 0 mL via INTRAVENOUS
  Administered 2014-12-06 – 2014-12-07 (×5): 10 mL via INTRAVENOUS

## 2014-11-30 MED ORDER — SILVER SULFADIAZINE 1 % TOPICAL CREAM
TOPICAL_CREAM | Freq: Every day | CUTANEOUS | Status: DC
Start: 2014-11-30 — End: 2014-12-07
  Filled 2014-11-30: qty 25

## 2014-11-30 MED ORDER — PANTOPRAZOLE 40 MG TABLET,DELAYED RELEASE
40.00 mg | DELAYED_RELEASE_TABLET | Freq: Every day | ORAL | Status: DC
Start: 2014-11-30 — End: 2014-12-07
  Administered 2014-11-30 – 2014-12-07 (×7): 40 mg via ORAL
  Filled 2014-11-30 (×9): qty 1

## 2014-11-30 MED ORDER — HYDROCHLOROTHIAZIDE 12.5 MG TABLET
12.50 mg | ORAL_TABLET | Freq: Every day | ORAL | Status: DC
Start: 2014-11-30 — End: 2014-12-07
  Administered 2014-11-30 – 2014-12-07 (×8): 12.5 mg via ORAL
  Filled 2014-11-30 (×8): qty 1

## 2014-11-30 MED ORDER — NITROGLYCERIN 0.4 MG SUBLINGUAL TABLET
0.40 mg | SUBLINGUAL_TABLET | SUBLINGUAL | Status: DC | PRN
Start: 2014-11-30 — End: 2014-12-07

## 2014-11-30 MED ORDER — ENOXAPARIN 40 MG/0.4 ML SUB-Q SYRINGE - EAST
40.0000 mg | INJECTION | Freq: Every day | SUBCUTANEOUS | Status: DC
Start: 2014-11-30 — End: 2014-12-07
  Administered 2014-11-30 – 2014-12-07 (×8): 0 mg via SUBCUTANEOUS
  Filled 2014-11-30: qty 0
  Filled 2014-11-30: qty 0.4
  Filled 2014-11-30 (×3): qty 0

## 2014-11-30 MED ORDER — DICLOFENAC SODIUM 75 MG TABLET,DELAYED RELEASE
75.00 mg | DELAYED_RELEASE_TABLET | Freq: Two times a day (BID) | ORAL | Status: DC
Start: 2014-11-30 — End: 2014-12-07
  Administered 2014-11-30 (×2): 75 mg via ORAL
  Administered 2014-11-30: 0 mg via ORAL
  Administered 2014-12-01 – 2014-12-07 (×13): 75 mg via ORAL
  Filled 2014-11-30 (×18): qty 1

## 2014-11-30 MED ORDER — ACETAMINOPHEN 325 MG TABLET
650.0000 mg | ORAL_TABLET | Freq: Four times a day (QID) | ORAL | Status: DC | PRN
Start: 2014-11-30 — End: 2014-12-07
  Administered 2014-12-04: 650 mg via ORAL
  Filled 2014-11-30: qty 2

## 2014-11-30 MED ORDER — GLIPIZIDE 5 MG TABLET - EAST
2.50 mg | ORAL_TABLET | Freq: Two times a day (BID) | ORAL | Status: DC
Start: 2014-11-30 — End: 2014-12-07
  Administered 2014-11-30 – 2014-12-07 (×16): 2.5 mg via ORAL
  Filled 2014-11-30 (×18): qty 1

## 2014-11-30 MED ORDER — ONDANSETRON HCL (PF) 4 MG/2 ML INJECTION SOLUTION
4.00 mg | Freq: Four times a day (QID) | INTRAMUSCULAR | Status: DC | PRN
Start: 2014-11-30 — End: 2014-12-07

## 2014-11-30 MED ORDER — LORATADINE 10 MG TABLET
10.00 mg | ORAL_TABLET | Freq: Every day | ORAL | Status: DC | PRN
Start: 2014-11-30 — End: 2014-12-07
  Administered 2014-12-05 – 2014-12-06 (×2): 10 mg via ORAL
  Filled 2014-11-30 (×4): qty 1

## 2014-11-30 MED ORDER — CLINDAMYCIN 150 MG/ML INJECTION SOLUTION
600.00 mg | Freq: Three times a day (TID) | INTRAMUSCULAR | Status: DC
Start: 2014-11-30 — End: 2014-12-04
  Administered 2014-11-30 (×2): 600 mg via INTRAVENOUS
  Administered 2014-11-30: 0 mg via INTRAVENOUS
  Administered 2014-11-30: 600 mg via INTRAVENOUS
  Administered 2014-11-30: 0 mg via INTRAVENOUS
  Administered 2014-12-01: 600 mg via INTRAVENOUS
  Administered 2014-12-01: 0 mg via INTRAVENOUS
  Administered 2014-12-01: 600 mg via INTRAVENOUS
  Administered 2014-12-01 (×2): 0 mg via INTRAVENOUS
  Administered 2014-12-01: 600 mg via INTRAVENOUS
  Administered 2014-12-02: 0 mg via INTRAVENOUS
  Administered 2014-12-02: 600 mg via INTRAVENOUS
  Administered 2014-12-02: 0 mg via INTRAVENOUS
  Administered 2014-12-02 (×2): 600 mg via INTRAVENOUS
  Administered 2014-12-02 – 2014-12-03 (×3): 0 mg via INTRAVENOUS
  Administered 2014-12-03 (×3): 600 mg via INTRAVENOUS
  Administered 2014-12-04: 0 mg via INTRAVENOUS
  Administered 2014-12-04 (×2): 600 mg via INTRAVENOUS
  Filled 2014-11-30 (×17): qty 4

## 2014-11-30 MED ORDER — LOSARTAN 50 MG-HYDROCHLOROTHIAZIDE 12.5 MG TABLET
1.0000 | ORAL_TABLET | Freq: Every day | ORAL | Status: DC
Start: 2014-11-30 — End: 2014-11-30

## 2014-11-30 MED ORDER — MORPHINE 4 MG/ML INTRAVENOUS CARTRIDGE
4.0000 mg | CARTRIDGE | INTRAVENOUS | Status: DC | PRN
Start: 2014-11-30 — End: 2014-12-07

## 2014-11-30 MED ORDER — SODIUM CHLORIDE 0.9 % (FLUSH) INJECTION SYRINGE
10.0000 mL | INJECTION | INTRAMUSCULAR | Status: DC | PRN
Start: 2014-11-30 — End: 2014-12-01
  Administered 2014-11-30: 10 mL via INTRAVENOUS

## 2014-11-30 MED ADMIN — sennosides 8.6 mg-docusate sodium 50 mg tablet: ORAL | @ 07:00:00

## 2014-11-30 MED ADMIN — sodium chloride 0.9 % intravenous solution: INTRAVENOUS | @ 06:00:00

## 2014-11-30 MED ADMIN — amiodarone 200 mg tablet: SUBCUTANEOUS | @ 16:00:00

## 2014-11-30 MED ADMIN — morphine 2 mg/mL injection syringe: INTRAVENOUS | @ 23:00:00

## 2014-11-30 MED ADMIN — sodium chloride 0.9 % intravenous solution: INTRAVENOUS | @ 19:00:00 | NDC 00338004903

## 2014-11-30 MED ADMIN — erythromycin 5 mg/gram (0.5 %) eye ointment: TOPICAL | @ 08:00:00 | NDC 17478007035

## 2014-11-30 MED ADMIN — lactated Ringers intravenous solution: INTRAVENOUS | @ 14:00:00 | NDC 00338011704

## 2014-11-30 MED ADMIN — nystatin 100,000 unit/gram topical ointment: SUBCUTANEOUS | @ 08:00:00 | NDC 00168000715

## 2014-11-30 NOTE — Care Plan (Addendum)
Problem: Skin Integrity Impairment, Risk/Actual (Adult)  Goal: Skin Integrity/Wound Healing  Patient will demonstrate the desired outcomes by discharge/transition of care.   Outcome: Ongoing (see interventions/notes)  Received patient awake, alert oriented with clear speech complains of a stinging sensation of left foot has black marking around reddened area  Starting at 3rd digit to 5th digit and tips of all 5 digits with redness of left foot..Saline lock patent in right AC oriented to room and call system instructed patient to call for assistance when ambulating to bathroom.My foot hurts a little when I walk on it I have a special  Shoe to wear I suggested for her family to bring it in reviewed all orders with patient she does not  Want to take the lovenox injection she would rather walk in the hallways. Explained what the above medication is for she has had lovenox before.She was hospitalized her on 5th floor on Jan.30-Feb 3.saw her podiatrist Dr Raquel Sarna last Wednesdayand saw Dr Georgia Dom onthis past Friday.

## 2014-11-30 NOTE — Care Management Notes (Signed)
11/30/14 1400   Assessment Detail   Assessment Type Admission   Date of Care Management Update 11/30/14   Social Work Plan   Discharge Planning Status initial meeting   Anticipated Discharge Disposition Home   Patient/Family In Agreement With Plan yes   Discharge Needs Assessment   Concerns To Be Addressed denies needs/concerns at this time   Equipment Currently Used at Home none   Equipment Needed After Discharge none   Transportation Available car;family or friend will provide   Referral Information   Admission Type inpatient   Address Verified verified-no changes   Insurance Verified verified-no change   Source of Information Patient   Living Environment   Lives With spouse   Living Arrangements house   Quality Of Family Relationships supportive   Able to Return to Prior Living Arrangements yes   Home Safety   Feels Safe Living In Home yes   Home Assessment: No Problems Identified   Home Accessibility no concerns     The patient lives with her husband in a one story home.  There are no steps to enter the home.  She doesn't have any medical equipment at her home.  Both her and her husband are able to drive.  She gets her scripts filled at the [facility] in [place].   She denies any discharge needs at this time.  Will follow with consult.

## 2014-11-30 NOTE — Progress Notes (Signed)
[facility]  [place], [place] [address]    Medicine Progress Note    Hellon Luan  Date of service: 11/30/2014  Date of Admission:  11/29/2014    Assessment/ Plan:   Active Hospital Problems   (*Primary Problem)    Diagnosis    *Cellulitis of foot, left     Improved after 24 hours IV Clindamycin. Continue tx. Will follow -may need another MRI to rule out osteomyelitis      Essential hypertension     Chronic     Controlled      Type 2 diabetes mellitus     Chronic     Control much improved - A1C 5.2% in January. Continue current regimen      Obesity     Chronic     Portion control, has already changed diet           DVT/PE Prophylaxis: Lovenox    Disposition Planning: Home discharge    ________________________________________________________________________      Subjective:   Her foot is still painful but is less red.  This foot was infected about 1 month ago.  She was soaking it at home because of a small sore and then developed widespread redness over the dorsum. Has a small sore on the plantar surface. Podiatrist at [facility] has been managing her foot care.     Her diabetes is much better controled due to drastic diet changes by her since late last year. Recent A1C 5.6% and her medication doses have been reduced        Physical Exam:  Vital Signs:  BP 120/78 mmHg   Pulse 70   Temp(Src) 36.5 C (97.7 F)   Resp 16   Ht 1.702 m (5\' 7" )   Wt 103.738 kg (228 lb 11.2 oz)   BMI 35.81 kg/m2   SpO2 100%   LMP 10/24/2014 (Approximate)    General: appears in good health, moderately obese and no distress  Lungs: Clear to auscultation bilaterally.   Cardiovascular: regular rate and rhythm  Abdomen: Soft, non-tender, Bowel sounds normal  Extremities: (L) foot with small area of erythema over lateral aspect of  dorsum of foot. Outline shows marked improvement in area of involvement. Small wound on plantar surface of foot at 5th metatarsal . No erythema or drainage from this  wound  Neurologic: decreased sensation stocking distribution    Input/Output  I/O last 24 hours: I/O current shift:     Intake/Output Summary (Last 24 hours) at 11/30/14 1730  Last data filed at 11/30/14 0700   Gross per 24 hour   Intake    120 ml   Output      0 ml   Net    120 ml          Labs:  I have reviewed all lab results.  BMP:     Lab Results   Component Value Date    SODIUM 137 11/30/2014    POTASSIUM 3.8 11/30/2014    CHLORIDE 106 11/30/2014    CO2 28 11/30/2014    BUN 19* 11/30/2014    CREATININE 0.85 11/30/2014    GLUCOSECJ 105 11/30/2014    ANIONGAP 3 11/30/2014    GFR >60 11/30/2014    CALCIUM 9.0 11/30/2014     CBC:     Lab Results   Component Value Date    WBC 6.4 11/30/2014    HGB 11.5* 11/30/2014    HCT 33.1* 11/30/2014    PLTCNT  218 11/30/2014    SEDRATE 46* 10/24/2014    RBC 3.97* 11/30/2014    MCV 83.4 11/30/2014    MCHC 34.9 11/30/2014    MCH 29.1 11/30/2014    RDW 13.8 11/30/2014    MPV 8.6 11/30/2014       Imaging Studies:    Results for orders placed or performed during the hospital encounter of 11/29/14 (from the past 24 hour(s))   XR FOOT LEFT SERIES     Status: None    Narrative    RADIOLOGIST: Gennie Alma, MD/bw     LEFT FOOT, 3 VIEWS:     CLINICAL HISTORY:  Erythema.  History of cellulitis.     COMMENTS: Radiographs of the left foot show no evidence of fracture,   dislocation, or other bony abnormality.         Impression       Normal study.            US VENOUS DOPPLER LOWER EXTREMITY LEFT     Status: None (In process)    Narrative    RADIOLOGIST: Austin Miles, MD/np     EXAMINATION: VENOUS DOPPLER ULTRASOUND OF THE LEFT LOWER EXTREMITY      HISTORY:  Swelling and erythema.     FINDINGS:  Grayscale, color and spectral Doppler imaging was utilized.  The left common femoral vein, femoral vein, and popliteal vein show normal flow and augmentation and are fully compressible without any evidence of deep venous thrombosis.       Impression       As above.                  PT/OT: No      Gerline Legacy, MD

## 2014-11-30 NOTE — Nurses Notes (Signed)
To bathroom independently. Ambulating in hall.

## 2014-11-30 NOTE — Nurses Notes (Signed)
Reported off to [facility] RN.

## 2014-12-01 ENCOUNTER — Inpatient Hospital Stay (HOSPITAL_BASED_OUTPATIENT_CLINIC_OR_DEPARTMENT_OTHER): Payer: No Typology Code available for payment source

## 2014-12-01 LAB — PERFORM POC FINGERSTICK GLUCOSE
BLD GLUCOSE POCT: 91 mg/dL (ref 60–100)
BLD GLUCOSE POCT: 75 mg/dL (ref 60–100)
BLD GLUCOSE POCT: 154 mg/dL — ABNORMAL HIGH (ref 60–100)
BLD GLUCOSE POCT: 114 mg/dL — ABNORMAL HIGH (ref 60–100)

## 2014-12-01 LAB — BASIC METABOLIC PROFILE - BMC/JMC ONLY
ANION GAP: 4 mmol/L (ref 3–11)
BUN: 22 mg/dL (ref 6–22)
CALCIUM: 8.9 mg/dL (ref 8.5–10.5)
CARBON DIOXIDE: 27 mmol/L (ref 22–32)
CHLORIDE: 105 mmol/L (ref 101–111)
CREATININE: 1.04 mg/dL — ABNORMAL HIGH (ref 0.53–1.00)
ESTIMATED GLOMERULAR FILTRATION RATE: 57 mL/min — ABNORMAL LOW (ref >60)
GLUCOSE: 100 mg/dL (ref 70–110)
POTASSIUM: 3.7 mmol/L (ref 3.5–5.0)
SODIUM: 136 mmol/L (ref 136–145)

## 2014-12-01 MED ORDER — GADOTERIDOL 279.3 MG/ML INTRAVENOUS SOLUTION
20.00 mL | INTRAVENOUS | Status: AC
Start: 2014-12-01 — End: 2014-12-01
  Administered 2014-12-01: 20 mL via INTRAVENOUS
  Filled 2014-12-01: qty 20

## 2014-12-01 MED ADMIN — potassium chloride 20 mEq/L in 0.9 % sodium chloride intravenous: INTRAVENOUS | @ 07:00:00 | NDC 00338069104

## 2014-12-01 MED ADMIN — diclofenac sodium 75 mg tablet,delayed release: ORAL | @ 21:00:00

## 2014-12-01 NOTE — Progress Notes (Signed)
[facility]  [place], [place] [address]    Medicine Progress Note    [name]  Date of service: 12/01/2014  Date of Admission:  11/29/2014    Assessment/ Plan:   Active Hospital Problems   (*Primary Problem)    Diagnosis    *Cellulitis of foot, left     Improving with IV Clindamycin. Concern exists for osteo since she has a wound on plantar surface of foot and cellulitis on dorsum. Also this is a recurrence within 4 weeks of infection in very same distribution. MRI to rule out osteomyelitis ordered      Essential hypertension     Chronic     Controlled      Type 2 diabetes mellitus     Chronic     Control much improved - A1C 5.2% in January. Continue current regimen      Obesity     Chronic     Portion control, has already changed diet           DVT/PE Prophylaxis: Lovenox    Disposition Planning: Home discharge    ________________________________________________________________________      Subjective:   Foot is better. IV access is a problem.  Wondering why this infection came back in same area.      Physical Exam:  Vital Signs:  BP 146/83 mmHg   Pulse 71   Temp(Src) 36.5 C (97.7 F)   Resp 16   Ht 1.702 m (5\' 7" )   Wt 103.738 kg (228 lb 11.2 oz)   BMI 35.81 kg/m2   SpO2 99%   LMP 10/24/2014 (Approximate)    General: appears in good health, moderately obese and no distress  Lungs: Clear to auscultation bilaterally.   Cardiovascular: regular rate and rhythm  Abdomen: Soft, non-tender, Bowel sounds normal  Extremities: (L) foot with dry wound on plantar surface, redness on dorsum of foot almost completely resolved.  Neurologic: decreased sensation stocking distribution bilat    Input/Output  I/O last 24 hours: I/O current shift:     Intake/Output Summary (Last 24 hours) at 12/01/14 1141  Last data filed at 12/01/14 1000   Gross per 24 hour   Intake    240 ml   Output      0 ml   Net    240 ml    03/08 0800 - 03/08 1559  In: 240 [P.O.:240]  Out: -      Labs:  I have reviewed all  lab results.  BMP:     Lab Results   Component Value Date    SODIUM 136 12/01/2014    POTASSIUM 3.7 12/01/2014    CHLORIDE 105 12/01/2014    CO2 27 12/01/2014    BUN 22 12/01/2014    CREATININE 1.04* 12/01/2014    GLUCOSECJ 100 12/01/2014    ANIONGAP 4 12/01/2014    GFR 57* 12/01/2014    CALCIUM 8.9 12/01/2014       Imaging Studies:   MRI pending    PT/OT: No      Gerline Legacy, MD

## 2014-12-01 NOTE — Care Plan (Signed)
Problem: General Plan of Care(Adult,OB)  Goal: Plan of Care Review(Adult,OB)  The patient and/or their representative will communicate an understanding of their plan of care   Outcome: Ongoing (see interventions/notes)    12/01/14 0535   Coping/Psychosocial   Plan Of Care Reviewed With patient   Sleeping between care. IVBP Cleocin given.     Problem: Skin Integrity Impairment, Risk/Actual (Adult)  Intervention: Prevent/Minimize Sheer/Friction Injuries    12/01/14 0535   Positioning   Pressure Reduction Techniques turned/repositioned   Skin Interventions   Pressure Reduction Devices specialty bed utilized         Goal: Identify Related Risk Factors and Signs and Symptoms  Related risk factors and signs and symptoms are identified upon initiation of Human Response Clinical Practice Guideline (CPG)   Outcome: Ongoing (see interventions/notes)    12/01/14 0535   Skin Integrity Impairment, Risk/Actual   Skin Integrity Impairment, Risk/Actual: Related Risk Factors edema   Signs and Symptoms (Skin Integrity Impairment) edema       Goal: Skin Integrity/Wound Healing  Patient will demonstrate the desired outcomes by discharge/transition of care.   Outcome: Ongoing (see interventions/notes)    12/01/14 0535   Skin Integrity Impairment, Risk/Actual (Adult)   Skin Integrity/Wound Healing making progress toward outcome         Problem: Pain, Acute (Adult)  Intervention: Monitor/Manage Analgesia    12/01/14 0535   Manage Acute Burn Pain   Bowel Intervention ambulation promoted       Intervention: Support/Optimize Psychosocial Response to Acute Pain    12/01/14 0535   Coping Strategies   Supportive Measures active listening utilized   Trust Relationship/Rapport care explained;questions answered         Goal: Identify Related Risk Factors and Signs and Symptoms  Related risk factors and signs and symptoms are identified upon initiation of Human Response Clinical Practice Guideline (CPG)   Outcome: Ongoing (see  interventions/notes)    12/01/14 0535   Pain, Acute   Signs and Symptoms (Acute Pain) BADLs/IADLs reluctance/inability to perform;questions meaning of pain       Goal: Acceptable Pain Control/Comfort Level  Patient will demonstrate the desired outcomes by discharge/transition of care.   Outcome: Ongoing (see interventions/notes)    12/01/14 0535   Pain, Acute (Adult)   Acceptable Pain Control/Comfort Level making progress toward outcome

## 2014-12-01 NOTE — Care Plan (Signed)
Problem: General Plan of Care(Adult,OB)  Goal: Plan of Care Review(Adult,OB)  The patient and/or their representative will communicate an understanding of their plan of care   Outcome: Ongoing (see interventions/notes)  Patient is aware of plan of care -had cellulitis in the same area about month ago and was on IV antibiotics. Dr Damian Leavell was in to see her. Per patient-Dr Ronnald Ramp wants an MRI of foot done and that patient may be here a couple more days. 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 Very receptive to teaching. Very aware of her FSBS-doesn't want to take insulin and does take her glipizide to control her FS. Refusing her lovenox as she feels her activity is enough.Last BM was today. IV had to be restarted-she says that she is a very hard stick.    Problem: Skin Integrity Impairment, Risk/Actual (Adult)  Goal: Skin Integrity/Wound Healing  Patient will demonstrate the desired outcomes by discharge/transition of care.   Outcome: Ongoing (see interventions/notes)  Cellulitis on top of foot-looks better per patient. Has a small wound on bottom of foot that students put silvadene on and DSD. Rest of skin intact. Handout on cellulitis given to patient.    Problem: Pain, Acute (Adult)  Goal: Acceptable Pain Control/Comfort Level  Patient will demonstrate the desired outcomes by discharge/transition of care.   Outcome: Ongoing (see interventions/notes)  Per patient-has no pain. Understands pain scale.

## 2014-12-02 DIAGNOSIS — A0472 Enterocolitis due to Clostridium difficile, not specified as recurrent: Secondary | ICD-10-CM | POA: Diagnosis not present

## 2014-12-02 LAB — BASIC METABOLIC PROFILE - BMC/JMC ONLY
ANION GAP: 7 mmol/L (ref 3–11)
BUN: 20 mg/dL (ref 6–22)
CALCIUM: 9.3 mg/dL (ref 8.5–10.5)
CARBON DIOXIDE: 25 mmol/L (ref 22–32)
CHLORIDE: 107 mmol/L (ref 101–111)
CREATININE: 1.02 mg/dL — ABNORMAL HIGH (ref 0.53–1.00)
ESTIMATED GLOMERULAR FILTRATION RATE: 58 mL/min — ABNORMAL LOW (ref >60)
GLUCOSE: 136 mg/dL — ABNORMAL HIGH (ref 70–110)
POTASSIUM: 3.8 mmol/L (ref 3.5–5.0)
SODIUM: 139 mmol/L (ref 136–145)

## 2014-12-02 LAB — CBC
BASOPHIL #: 0 10*3/uL (ref 0.0–0.1)
BASOPHILS %: 0.4 % (ref 0.0–2.5)
EOSINOPHIL #: 0.1 10*3/uL (ref 0.0–0.5)
EOSINOPHIL %: 0.8 % (ref 0.0–5.2)
HCT: 34.6 % — ABNORMAL LOW (ref 36.0–45.0)
HGB: 11.6 g/dL — ABNORMAL LOW (ref 12.0–15.5)
LYMPHOCYTE #: 1.4 K/uL (ref 0.7–3.2)
LYMPHOCYTE %: 15.4 % (ref 15.0–43.0)
MCH: 28.1 pg — ABNORMAL LOW (ref 28.3–34.3)
MCHC: 33.4 g/dL (ref 32.0–36.0)
MCV: 84.1 fL (ref 82.0–97.0)
MONOCYTE #: 0.5 K/uL (ref 0.2–0.9)
MONOCYTE %: 5.6 % (ref 4.8–12.0)
MPV: 9.1 fL (ref 7.4–10.5)
MPV: 9.1 fL (ref 7.4–10.5)
NRBC ABSOLUTE: 0.01 10*3/uL (ref 0–0.02)
NRBC: 0.1 /100{WBCs} (ref 0–0.3)
PLATELET COUNT: 206 K/uL (ref 150–450)
PMN #: 7.1 10*3/uL — ABNORMAL HIGH (ref 1.5–6.5)
PMN %: 77.8 % — ABNORMAL HIGH (ref 43.0–76.0)
RBC: 4.11 M/uL (ref 4.00–5.10)
RDW: 14.2 % (ref 10.5–14.5)
WBC: 9.1 K/uL (ref 4.0–11.0)

## 2014-12-02 LAB — PERFORM POC FINGERSTICK GLUCOSE
BLD GLUCOSE POCT: 127 mg/dL — ABNORMAL HIGH (ref 60–100)
BLD GLUCOSE POCT: 105 mg/dL — ABNORMAL HIGH (ref 60–100)
BLD GLUCOSE POCT: 92 mg/dL (ref 60–100)
BLD GLUCOSE POCT: 114 mg/dL — ABNORMAL HIGH (ref 60–100)

## 2014-12-02 LAB — CDIFF BY PCR - BMC/JMC ONLY
027PRESUMPTIVE: NEGATIVE
TOXIGENIC CDIFF: POSITIVE — AB

## 2014-12-02 MED ORDER — SACCHAROMYCES BOULARDII 250 MG CAPSULE
250.00 mg | ORAL_CAPSULE | Freq: Two times a day (BID) | ORAL | Status: DC
Start: 2014-12-02 — End: 2014-12-07
  Administered 2014-12-02 – 2014-12-07 (×10): 250 mg via ORAL
  Filled 2014-12-02 (×12): qty 1

## 2014-12-02 MED ORDER — VANCOMYCIN 25 MG/ML ORAL LIQUID WITH CHERRY FLAVORING
250.00 mg | Freq: Four times a day (QID) | ORAL | Status: DC
Start: 2014-12-02 — End: 2014-12-07
  Administered 2014-12-02 (×2): 250 mg via ORAL
  Administered 2014-12-02: 0 mg via ORAL
  Administered 2014-12-03 – 2014-12-07 (×18): 250 mg via ORAL
  Filled 2014-12-02 (×3): qty 120

## 2014-12-02 MED ORDER — POTASSIUM CHLORIDE 20 MEQ/L IN 0.9 % SODIUM CHLORIDE INTRAVENOUS
INTRAVENOUS | Status: AC
Start: 2014-12-02 — End: 2014-12-03
  Filled 2014-12-02: qty 1000

## 2014-12-02 MED ORDER — SODIUM CHLORIDE 0.9 % INTRAVENOUS SOLUTION
INTRAVENOUS | Status: DC
Start: 2014-12-02 — End: 2014-12-02

## 2014-12-02 MED ADMIN — haloperidol lactate 5 mg/mL injection solution: ORAL | @ 20:00:00

## 2014-12-02 MED ADMIN — oxyCODONE ER 10 mg tablet,extended release,12 hr: ORAL | @ 15:00:00

## 2014-12-02 MED ADMIN — clopidogreL 75 mg tablet: INTRAVENOUS | @ 22:00:00

## 2014-12-02 MED ADMIN — Lactobacillus acidoph-L.bulgaricus 1 million cell tablet: INTRAVENOUS | @ 13:00:00

## 2014-12-02 MED ADMIN — lanolin-oxyquin-pet, hydrophil topical ointment: INTRAVENOUS | @ 01:00:00 | NDC 09999989257

## 2014-12-02 NOTE — Nurses Notes (Signed)
Dr Damian Leavell made aware of pts +CDIFF result, pt being moved to a private room.

## 2014-12-02 NOTE — Nurses Notes (Signed)
Patient informed nursing of loose BM x2  would like medicine for relief Dr Coralie Common MD paged per operator. Awaiting return call.

## 2014-12-02 NOTE — Care Plan (Signed)
Problem: General Plan of Care(Adult,OB)  Goal: Plan of Care Review(Adult,OB)  The patient and/or their representative will communicate an understanding of their plan of care   Outcome: Ongoing (see interventions/notes)  Pt. Denies c/o pain this a.m., but does c/o "tightness" in left foot.  N/v checks to left foot intact.  Pt. Did decline insulin coverage with fingerstick this a.m.  Pt. Refused Lovenox injection but did state she was up ambulating frequently.  Pt. Had several loose b.m.'s earlier this a.m.  Reminded pt. To obtain stool specimen upon next b.m. So that new med, Vancomycin liquid, could be started.  Pt. Verbalized understanding, but no further b.m.'s at this time. IV Cleocin given as ordered, see e-mar.    Problem: Skin Integrity Impairment, Risk/Actual (Adult)  Goal: Identify Related Risk Factors and Signs and Symptoms  Related risk factors and signs and symptoms are identified upon initiation of Human Response Clinical Practice Guideline (CPG)   Outcome: Ongoing (see interventions/notes)  Applied silvadene to callus to planar aspect of left, lateral foot.  Covered area with 4x4 dressing.  Pt. Still has edema of left foot, some redness noted, but pt. Stated redness and edema has decreased.  N/V checks intact, but does c/o "tightness" in left foot.    Goal: Skin Integrity/Wound Healing  Patient will demonstrate the desired outcomes by discharge/transition of care.   Outcome: Ongoing (see interventions/notes)    Problem: Pain, Acute (Adult)  Goal: Identify Related Risk Factors and Signs and Symptoms  Related risk factors and signs and symptoms are identified upon initiation of Human Response Clinical Practice Guideline (CPG)   Outcome: Ongoing (see interventions/notes)  Pain level is "0" this a.m.  Goal: Acceptable Pain Control/Comfort Level  Patient will demonstrate the desired outcomes by discharge/transition of care.   Outcome: Ongoing (see interventions/notes)

## 2014-12-02 NOTE — H&P (Signed)
[facility]  [place], [place] [address]    Podiatry Consult Follow Up    [name]  Date of Service: 12/02/2014  Date of Admission:  11/29/2014      Chief Complaint:  Admitted for recurring L foot cellulitis.    Podiatry Complaint: Same as chief C/O    Subjective: Denies pain. Gives Hx of splinter in early Jan 2016. Having that removed by Dr Raquel Sarna and 2 recurring cellulitis events within 1 mth of each other. Neg MRI x 2 from this admission and the one in Jan 2016. Also 2 neg US venous dopplers from same dates. Hx of controlled DM2 from patient and from her current HgbA1c.     Objective:  Temperature: 36.5 C (97.7 F)  Heart Rate: 74  BP (Non-Invasive): (!) 156/89 mmHg  Respiratory Rate: 18  SpO2-1: 100 %  Pain Score (Numeric, Faces): 0    General: moderately obese, appears stated age and no distress  Vascular exam: B/L -+2/4, palpable B/L 0/4 P.T. Edema:  absent - B/L feet, legsB/L 1st digit less than or equal to 5 secondsB/L felt absentB/L -warm to warm  Dermatologic exam Wounds/ulcerations: No wounds present. Nails 1-10 Y/T/D.  Neurologic exam Gross sensation absent: plantar foot on the bilateral, Vibratory Intact  Orthopedic exam Muscle strength Muscle strength in feet is normal throughout  Foot deformities No exostosis, No masses and no contracted digits left, right  .    Labs:  I have reviewed all lab results.  Imaging Studies:  MRI:   Report noted:  Neg   Ultrasound:   Report noted:  Neg.       Assessment/Recommendations/Consults-Referrals:  A1.ASSESSMENTS:  DM2 W PVD / NEURO CHANGES.   ONYCHOMYCOSIS.   L foot cellulitis possibly 2nd to F.O. To small to br seen by MRI vs venous congestion --> edema--> cellulitis.  Hx b/l foot edema while standing on her feet at work daily.     B1. RECOMMENDATIONS  None.     C1.CONSULTS/REFERRALS:  None.    Podiatry wound care recommendations:  Ted stockings b/l and a longer than normal course after IVAB of po atb's. Spoke with Dr Marcell Anger if venous  foot mapping was an option. It isn't. Have settled for the Teds therapy. If this recurs a 3 time than an exploratory surgery may be indicated for a F.O. which is to small to be picked up by MRI.  Dr Ronnald Ramp thank you for this interesting case. Please reconsult prn.

## 2014-12-02 NOTE — Care Plan (Signed)
Problem: General Plan of Care(Adult,OB)  Goal: Plan of Care Review(Adult,OB)  The patient and/or their representative will communicate an understanding of their plan of care   Patient on IVPB of cleocin and sleeping in between care IV saline lock  Patent flushes without difficulty.

## 2014-12-02 NOTE — Nurses Notes (Signed)
Information for consult with Dr. Dionne Milo was given to Juliann Pulse at MD's office.

## 2014-12-02 NOTE — Care Plan (Signed)
Problem: General Plan of Care(Adult,OB)  Goal: Plan of Care Review(Adult,OB)  The patient and/or their representative will communicate an understanding of their plan of care   Outcome: Ongoing (see interventions/notes)  Patient denied need for pain medication understanding pain scale Patient states that she says left foot looks better has a "little numbness and tingling" of left foot ambulated to bathroom independently and ambulating in [place]

## 2014-12-02 NOTE — Nurses Notes (Signed)
Had 3rd liquid stool"Smells like a breast fed  Baby stools.This happened before when I was on antibotics for the same problem. and the doctor wanted stool speciums to check for Cdiff

## 2014-12-02 NOTE — Progress Notes (Signed)
[facility]  [place], [place] [address]    Medicine Progress Note    [name]  Date of service: 12/02/2014  Date of Admission:  11/29/2014    Assessment/ Plan:   Active Hospital Problems   (*Primary Problem)    Diagnosis    *Cellulitis of foot, left     On  IV Clindamycin she is improving but still with some redness on plantar surface of foot.  This is a recurrence within 4 weeks of infection in very same distribution. MRI shows no osteomyelitis. Podiatry consulted      C. difficile diarrhea     She has been on a variety of antibiotics prior to admission, most recently [facility]. Vancomycin oral started last night. Contact isolation      Essential hypertension     Chronic     Controlled      Type 2 diabetes mellitus     Chronic     Control much improved - A1C 5.2% in January. Continue current regimen      Obesity     Chronic     Portion control, has already changed diet           DVT/PE Prophylaxis: Lovenox    Disposition Planning: Home discharge    ________________________________________________________________________      Subjective:   Foot still feels "tight" as if a band around the distal portion.  NO pain.  Started having diarrhea last night, no blood. Stool positive for c. Difficile.  Reports similar episode last admission and had diarrhea for 4 weeks. 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       Physical Exam:  Vital Signs:  BP 142/81 mmHg   Pulse 76   Temp(Src) 36.4 C (97.5 F)   Resp 18   Ht 1.702 m (5\' 7" )   Wt 103.738 kg (228 lb 11.2 oz)   BMI 35.81 kg/m2   SpO2 97%   LMP 10/24/2014 (Approximate)    General: appears in good health and no distress  Lungs: Clear to auscultation bilaterally.   Cardiovascular: regular rate and rhythm  Abdomen: Soft, non-tender, Bowel sounds normal  Extremities: (L) foot with minimal edema over distal dorsal area. 2 small wounds on plantar surface with slight redness. NO discharge  Neurologic:  decreased sensation in stocking distribution    Input/Output  I/O last 24 hours: I/O current shift:     Intake/Output Summary (Last 24 hours) at 12/02/14 1406  Last data filed at 12/02/14 1200   Gross per 24 hour   Intake    590 ml   Output      0 ml   Net    590 ml    03/09 0800 - 03/09 1559  In: 240 [P.O.:240]  Out: -      Labs:  I have reviewed all lab results.  BMP:     Lab Results   Component Value Date    SODIUM 139 12/02/2014    POTASSIUM 3.8 12/02/2014    CHLORIDE 107 12/02/2014    CO2 25 12/02/2014    BUN 20 12/02/2014    CREATININE 1.02* 12/02/2014    GLUCOSECJ 136* 12/02/2014    ANIONGAP 7 12/02/2014    GFR 58* 12/02/2014    CALCIUM 9.3 12/02/2014     CBC:     Lab Results   Component Value Date    WBC 9.1 12/02/2014    HGB 11.6*  12/02/2014    HCT 34.6* 12/02/2014    PLTCNT 206 12/02/2014    SEDRATE 46* 10/24/2014    RBC 4.11 12/02/2014    MCV 84.1 12/02/2014    MCHC 33.4 12/02/2014    MCH 28.1* 12/02/2014    RDW 14.2 12/02/2014    MPV 9.1 12/02/2014       Imaging Studies:    Results for orders placed or performed during the hospital encounter of 11/29/14 (from the past 24 hour(s))   MRI FOOT LEFT WO/W IV CONTRAST     Status: None    Narrative    RADIOLOGIST: Theressa Millard, MD/pb     MRI OF THE LEFT FOOT WITHOUT AND WITH IV CONTRAST     REASON FOR EXAMINATION:  Ongoing cellulitis for 1 month, to assess for   osteomyelitis.  Specific area of concern is fifth metatarsal.     FINDINGS:  There is no evidence of osteomyelitis involving the fifth   metatarsal, or elsewhere in the foot.  There is soft tissue swelling seen   around the fifth metatarsal in the region of the head and neck region;   however, the abnormal soft tissue thickening is confined to the soft   tissues without extension into the bones.  The fifth metatarsophalangeal   joints are also within normal limits.       Impression       No MRI evidence of osteomyelitis.                Gerline Legacy, MD

## 2014-12-03 LAB — BASIC METABOLIC PROFILE - BMC/JMC ONLY
ANION GAP: 5 mmol/L (ref 3–11)
BUN: 19 mg/dL (ref 6–22)
CALCIUM: 9.1 mg/dL (ref 8.5–10.5)
CARBON DIOXIDE: 28 mmol/L (ref 22–32)
CHLORIDE: 109 mmol/L (ref 101–111)
CREATININE: 1.11 mg/dL — ABNORMAL HIGH (ref 0.53–1.00)
ESTIMATED GLOMERULAR FILTRATION RATE: 53 mL/min — ABNORMAL LOW (ref >60)
GLUCOSE: 74 mg/dL (ref 70–110)
POTASSIUM: 4.1 mmol/L (ref 3.5–5.0)
POTASSIUM: 4.1 mmol/L (ref 3.5–5.0)
SODIUM: 142 mmol/L (ref 136–145)

## 2014-12-03 LAB — PERFORM POC FINGERSTICK GLUCOSE
BLD GLUCOSE POCT: 94 mg/dL (ref 60–100)
BLD GLUCOSE POCT: 74 mg/dL (ref 60–100)
BLD GLUCOSE POCT: 151 mg/dL — ABNORMAL HIGH (ref 60–100)
BLD GLUCOSE POCT: 112 mg/dL — ABNORMAL HIGH (ref 60–100)

## 2014-12-03 MED ORDER — LOSARTAN 25 MG TABLET
100.0000 mg | ORAL_TABLET | Freq: Every day | ORAL | Status: DC
Start: 2014-12-04 — End: 2014-12-07
  Administered 2014-12-04 – 2014-12-07 (×4): 100 mg via ORAL
  Filled 2014-12-03 (×4): qty 4

## 2014-12-03 MED ORDER — LOSARTAN 25 MG TABLET
50.00 mg | ORAL_TABLET | ORAL | Status: AC
Start: 2014-12-03 — End: 2014-12-03
  Administered 2014-12-03: 50 mg via ORAL
  Filled 2014-12-03: qty 2

## 2014-12-03 MED ADMIN — insulin lispro 100 unit/mL subcutaneous solution: SUBCUTANEOUS | @ 16:00:00

## 2014-12-03 MED ADMIN — losartan 25 mg tablet: ORAL | @ 18:00:00

## 2014-12-03 MED ADMIN — lactated Ringers intravenous solution: ORAL | @ 09:00:00 | NDC 00338011704

## 2014-12-03 MED ADMIN — POVIDONE-IODINE 10% TOPICAL SOLN (BETADINE) IN NS IRRIGATION: ORAL | @ 09:00:00 | NDC 67618015005

## 2014-12-03 NOTE — Progress Notes (Signed)
[facility]  [place], [place] [address]    Medicine Progress Note    [name]  Date of service: 12/03/2014  Date of Admission:  11/29/2014    Assessment/ Plan:   Active Hospital Problems   (*Primary Problem)    Diagnosis    *Cellulitis of foot, left     On  IV Clindamycin she is improving. Dr. Acree's consult appreciated.  Will continue through tomorrow then switch to oral. Long oral course per Dr. Dionne Milo.      C. difficile diarrhea     Diarrhea decreased -still with 4 episodes today. Continue vancomycin orally      Essential hypertension     Chronic     Controlled      Type 2 diabetes mellitus     Chronic     Control much improved - A1C 5.2% in January. Continue current regimen      Obesity     Chronic     Portion control, has already changed diet           DVT/PE Prophylaxis: Lovenox    Disposition Planning: Home discharge    ________________________________________________________________________      Subjective:   Less diarrhea. Up in chair and foot is elevated.        Physical Exam:  Vital Signs:  BP 170/86 mmHg   Pulse 75   Temp(Src) 36.5 C (97.7 F)   Resp 18   Ht 1.702 m (5\' 7" )   Wt 103.738 kg (228 lb 11.2 oz)   BMI 35.81 kg/m2   SpO2 99%   LMP 10/24/2014 (Approximate)    General: appears in good health, moderately obese and no distress  Lungs: Clear to auscultation bilaterally.   Cardiovascular: regular rate and rhythm  Abdomen: Soft, non-tender, Bowel sounds normal  Extremities: slight soft tissue swelling (L) foot dorsal aspect  Neurologic: decreased sensation feet    Input/Output  I/O last 24 hours: I/O current shift:     Intake/Output Summary (Last 24 hours) at 12/03/14 1740  Last data filed at 12/03/14 1400   Gross per 24 hour   Intake    480 ml   Output      0 ml   Net    480 ml          Labs:  BMP:     Lab Results   Component Value Date    SODIUM 142 12/03/2014    POTASSIUM 4.1 12/03/2014    CHLORIDE 109 12/03/2014    CO2 28 12/03/2014    BUN 19 12/03/2014    CREATININE 1.11* 12/03/2014    GLUCOSECJ 74 12/03/2014    ANIONGAP 5 12/03/2014    GFR 53* 12/03/2014    CALCIUM 9.1 12/03/2014       Gerline Legacy, MD

## 2014-12-03 NOTE — Care Plan (Signed)
Problem: General Plan of Care(Adult,OB)  Goal: Plan of Care Review(Adult,OB)  The patient and/or their representative will communicate an understanding of their plan of care   Outcome: Ongoing (see interventions/notes)  Patient up in room independently, stated no BM since 4am she had 2 movements at that time.  She offers no c/o pain or any needs this shift so far.  Antibiotic infused by students and instructor along with am medications.  No prn medications this shift.  She is wearing her yellow non-skid sock and resting in bed with legs elevated later this shift.  Left foot with very slight pink tinge around the cellulitis site.   Bottom of foot is without drainage and no open skin visualized.  Very little edema.   IV is saline locked.

## 2014-12-03 NOTE — Care Plan (Signed)
Problem: General Plan of Care(Adult,OB)  Goal: Plan of Care Review(Adult,OB)  The patient and/or their representative will communicate an understanding of their plan of care   Outcome: Ongoing (see interventions/notes)    12/03/14 0528   Coping/Psychosocial   Plan Of Care Reviewed With patient     Small sore present plantar aspect left foot. No drainage. Pulse +. Pt states it feels like "ACE bandage wrapped around foot tight." Iv needed restarted again, access gained left wrist. 1L of NS + K infused. Pt is up ad lib in room. Dr. Michaelle Copas note suggests need for bilat. Ted hose although there is no order. Will verify in AM. Pt on PO vanc for +cdiff.     Problem: Pain, Acute (Adult)  Goal: Acceptable Pain Control/Comfort Level  Patient will demonstrate the desired outcomes by discharge/transition of care.   Outcome: Ongoing (see interventions/notes)  Pt states she has no pain and is able to bear weight left foot.

## 2014-12-04 ENCOUNTER — Inpatient Hospital Stay (HOSPITAL_BASED_OUTPATIENT_CLINIC_OR_DEPARTMENT_OTHER): Payer: No Typology Code available for payment source

## 2014-12-04 LAB — PERFORM POC FINGERSTICK GLUCOSE
BLD GLUCOSE POCT: 97 mg/dL (ref 60–100)
BLD GLUCOSE POCT: 104 mg/dL — ABNORMAL HIGH (ref 60–100)
BLD GLUCOSE POCT: 116 mg/dL — ABNORMAL HIGH (ref 60–100)
BLD GLUCOSE POCT: 115 mg/dL — ABNORMAL HIGH (ref 60–100)

## 2014-12-04 LAB — C-REACTIVE PROTEIN(CRP),INFLAMMATION: C-REACTIVE PROTEIN (CRP),INFLAMMATION: 3.1 mg/L (ref 0.0–8.0)

## 2014-12-04 MED ORDER — LIDOCAINE HCL 10 MG/ML (1 %) INJECTION SOLUTION
INTRAMUSCULAR | Status: AC
Start: 2014-12-04 — End: 2014-12-04
  Administered 2014-12-04: 2 mL via INTRADERMAL
  Filled 2014-12-04: qty 20

## 2014-12-04 MED ORDER — VANCOMYCIN 1,000 MG (50 MG/ML) INTRAVENOUS INJECTION
1750.0000 mg | Freq: Once | INTRAVENOUS | Status: AC
Start: 2014-12-04 — End: 2014-12-05
  Administered 2014-12-04: 1750 mg via INTRAVENOUS
  Administered 2014-12-05: 0 mg via INTRAVENOUS
  Filled 2014-12-04: qty 35

## 2014-12-04 MED ORDER — AMPICILLIN-SULBACTAM 15 GRAM SOLUTION FOR INJECTION
3.00 g | Freq: Four times a day (QID) | INTRAMUSCULAR | Status: DC
Start: 2014-12-04 — End: 2014-12-07
  Administered 2014-12-04: 3 g via INTRAVENOUS
  Administered 2014-12-05 (×2): 0 g via INTRAVENOUS
  Administered 2014-12-05: 3 g via INTRAVENOUS
  Administered 2014-12-05: 0 g via INTRAVENOUS
  Administered 2014-12-05: 3 g via INTRAVENOUS
  Administered 2014-12-05: 0 g via INTRAVENOUS
  Administered 2014-12-05 – 2014-12-06 (×3): 3 g via INTRAVENOUS
  Administered 2014-12-06: 0 g via INTRAVENOUS
  Administered 2014-12-06: 3 g via INTRAVENOUS
  Administered 2014-12-06 (×3): 0 g via INTRAVENOUS
  Administered 2014-12-06 (×2): 3 g via INTRAVENOUS
  Administered 2014-12-06: 0 g via INTRAVENOUS
  Administered 2014-12-06 – 2014-12-07 (×2): 3 g via INTRAVENOUS
  Administered 2014-12-07: 0 g via INTRAVENOUS
  Administered 2014-12-07: 3 g via INTRAVENOUS
  Administered 2014-12-07: 0 g via INTRAVENOUS
  Administered 2014-12-07: 3 g via INTRAVENOUS
  Filled 2014-12-04 (×20): qty 20

## 2014-12-04 MED ORDER — VANCOMYCIN 1 GRAM/200 ML IN DEXTROSE 5 % INTRAVENOUS PIGGYBACK
1000.00 mg | INJECTION | Freq: Two times a day (BID) | INTRAVENOUS | Status: DC
Start: 2014-12-05 — End: 2014-12-06
  Administered 2014-12-05: 1000 mg via INTRAVENOUS
  Administered 2014-12-05 (×2): 0 mg via INTRAVENOUS
  Administered 2014-12-05: 1000 mg via INTRAVENOUS
  Administered 2014-12-06: 0 mg via INTRAVENOUS
  Filled 2014-12-04 (×5): qty 200

## 2014-12-04 MED ORDER — LIDOCAINE HCL 10 MG/ML (1 %) INJECTION SOLUTION
2.0000 mL | INTRAMUSCULAR | Status: AC
Start: 2014-12-04 — End: 2014-12-04

## 2014-12-04 MED ORDER — PHARMACY VANCOMYCIN AND/OR AMINOGLYCOSIDE CONSULT - EAST
Freq: Once | Status: DC | PRN
Start: 2014-12-04 — End: 2014-12-07
  Filled 2014-12-04: qty 1

## 2014-12-04 MED ORDER — AMLODIPINE 5 MG TABLET
5.0000 mg | ORAL_TABLET | Freq: Every day | ORAL | Status: DC
Start: 2014-12-04 — End: 2014-12-07
  Administered 2014-12-04 – 2014-12-07 (×4): 5 mg via ORAL
  Filled 2014-12-04 (×4): qty 1

## 2014-12-04 MED ADMIN — Saccharomyces boulardii 250 mg capsule: ORAL | @ 10:00:00

## 2014-12-04 MED ADMIN — sodium chloride 0.9 % intravenous solution: ORAL | @ 10:00:00 | NDC 00338004904

## 2014-12-04 MED ADMIN — sodium chloride 0.9 % intravenous solution: ORAL | @ 12:00:00 | NDC 00338004904

## 2014-12-04 MED ADMIN — PEDS CUSTOM PARENTERAL NUTRITION - LESS THAN 6 MONTHS: INTRAVENOUS | @ 01:00:00 | NDC 00338113003

## 2014-12-04 NOTE — Nurses Notes (Signed)
Ir nurses note picc line    1725 pm pt down for picc line; see xper for documentation

## 2014-12-04 NOTE — Nurses Notes (Signed)
Double lumen PICC RUA, ports flush well. IV unasyn started.

## 2014-12-04 NOTE — Pharmacy Vancomycin Dosing (Addendum)
[facility] Vancomycin Consult   [place], 49 y.o. female  Date of Birth:  [birth year]  Date of Admission:  11/29/2014  MRN# [id number]    Current height: Height: 5\' 7"   Current weight: Weight: 103.738 kg (228 lb 11.2 oz)    Drug: Vancomycin  Indication: Cellulitis/Diabetic Foot Ulcer  Organism: Unknown  Desired Level:  Trough- ~ 15 mcg/mL    Date RPh Current regimen  SCr mg/dl CrCl* ml/min Measured level mcg/ml Plan  Comments   12/04/14 AS Vancomycin 1,750 mg loading dose then 1,000 mg IV Q12H 1.11 78.4  Trough 3/13 at 0800 Predicted trough= 15.3                                                                   *Creatinine clearance is estimated by using the Cockcroft-Gault equation.    Please contact pharmacy with any questions.    Leveda Anna, PHARMD   12/04/2014, 11:38

## 2014-12-04 NOTE — Nurses Notes (Signed)
Report called from IR, Leann, double lumen PICC, [id number] Right basillic.

## 2014-12-04 NOTE — Progress Notes (Addendum)
[facility]  [place], [place] [address]    Medicine Progress Note    [name]  Date of service: 12/04/2014  Date of Admission:  11/29/2014    Assessment/ Plan:   Active Hospital Problems   (*Primary Problem)    Diagnosis    *Cellulitis of foot, left     The left foot remains warm and there is slight erythema on plantar surface.  I will changeantibiotics to Unasyn and Vancomycin IV, stop Clinda. Cancel discharge for today. I have advised her to call off work for next week. We decided to take her off for the next 2 weeks since she stands at work-then restart after spring break on April 4th.      C. difficile diarrhea     Diarrhea decreased -last loose stool during night. None so far today.  Continue vancomycin orally and contact isolation. Will continue oral Vanc throughout her course of antibiotics.      Essential hypertension     Chronic     Not at goal. Losartan increased already. Will add Amlodipine 5. Goal <120/80      Type 2 diabetes mellitus     Chronic     Control much improved - A1C 5.2% in January. Continue current regimen      Obesity     Chronic     Portion control, has already changed diet           DVT/PE Prophylaxis: Lovenox    Disposition Planning: Home discharge    ________________________________________________________________________      Subjective:   Improvement in diarrhea - none so far today. No abdominal pain.       Physical Exam:  Vital Signs:  BP 146/76 mmHg   Pulse 82   Temp(Src) 36.5 C (97.7 F)   Resp 18   Ht 1.702 m (5\' 7" )   Wt 103.738 kg (228 lb 11.2 oz)   BMI 35.81 kg/m2   SpO2 100%   LMP 10/24/2014 (Approximate)    General: appears in good health, moderately obese and no distress  Lungs: Clear to auscultation bilaterally.   Cardiovascular: regular rate and rhythm  Abdomen: Soft, non-tender, Bowel sounds normal  Extremities: (L) foot warm with mild erythema around the 2 wounds persists  Neurologic: decreased sensation stocking distribution  bilaterally    Input/Output  I/O last 24 hours: I/O current shift:     Intake/Output Summary (Last 24 hours) at 12/04/14 1326  Last data filed at 12/04/14 1000   Gross per 24 hour   Intake    840 ml   Output      0 ml   Net    840 ml    03/11 0800 - 03/11 1559  In: 240 [P.O.:240]  Out: -      Labs:  FS glu 97    Imaging Studies:   No new imaging      PT/OT: No      Gerline Legacy, MD

## 2014-12-04 NOTE — Care Management Notes (Signed)
Received a consult re: prior authorization for Vancomycin for this patient for discharge.  Phoned our pharmacy and spoke with Roselyn Reef, was told that they don't need a prior authorization for the medication and the patient's copay would be 10 dollars.  Roselyn Reef did say that they would actually need the script before they would be able to dispense it.  Phoned the ortho floor and spoke with Raquel Sarna the charge nurse and made her aware of the above information.

## 2014-12-05 LAB — CBC
BASOPHIL #: 0 K/uL (ref 0.0–0.1)
BASOPHILS %: 0.7 % (ref 0.0–2.5)
EOSINOPHIL #: 0.1 10*3/uL (ref 0.0–0.5)
EOSINOPHIL %: 2 % (ref 0.0–5.2)
HCT: 36.3 % (ref 36.0–45.0)
HGB: 12 g/dL (ref 12.0–15.5)
LYMPHOCYTE #: 1.3 10*3/uL (ref 0.7–3.2)
LYMPHOCYTE %: 22.8 % (ref 15.0–43.0)
MCH: 27.8 pg — ABNORMAL LOW (ref 28.3–34.3)
MCHC: 33.1 g/dL (ref 32.0–36.0)
MCV: 84.1 fL (ref 82.0–97.0)
MONOCYTE #: 0.3 K/uL (ref 0.2–0.9)
MONOCYTE %: 6 % (ref 4.8–12.0)
MPV: 8.9 fL (ref 7.4–10.5)
NRBC ABSOLUTE: 0 10*3/uL (ref 0–0.02)
NRBC: 0 /100 WBC (ref 0–0.3)
PLATELET COUNT: 211 K/uL (ref 150–450)
PMN #: 3.8 10*3/uL (ref 1.5–6.5)
PMN %: 68.5 % (ref 43.0–76.0)
RBC: 4.32 M/uL (ref 4.00–5.10)
RDW: 14.2 % (ref 10.5–14.5)
WBC: 5.6 10*3/uL — AB (ref 4.0–11.0)

## 2014-12-05 LAB — BASIC METABOLIC PROFILE - BMC/JMC ONLY
ANION GAP: 7 mmol/L (ref 3–11)
BUN: 17 mg/dL (ref 6–22)
CALCIUM: 9.4 mg/dL (ref 8.5–10.5)
CARBON DIOXIDE: 29 mmol/L (ref 22–32)
CHLORIDE: 107 mmol/L (ref 101–111)
CREATININE: 1.02 mg/dL — ABNORMAL HIGH (ref 0.53–1.00)
ESTIMATED GLOMERULAR FILTRATION RATE: 58 mL/min — ABNORMAL LOW (ref >60)
GLUCOSE: 79 mg/dL (ref 70–110)
POTASSIUM: 3.7 mmol/L (ref 3.5–5.0)
SODIUM: 143 mmol/L (ref 136–145)

## 2014-12-05 LAB — PERFORM POC FINGERSTICK GLUCOSE
BLD GLUCOSE POCT: 103 mg/dL — ABNORMAL HIGH (ref 60–100)
BLD GLUCOSE POCT: 114 mg/dL — ABNORMAL HIGH (ref 60–100)
BLD GLUCOSE POCT: 97 mg/dL (ref 60–100)
BLD GLUCOSE POCT: 62 mg/dL (ref 60–100)

## 2014-12-05 MED ADMIN — Medication: INTRAVENOUS | @ 17:00:00

## 2014-12-05 MED ADMIN — Medication: INTRAVENOUS | @ 18:00:00

## 2014-12-05 MED ADMIN — Medication: INTRAVENOUS | @ 06:00:00

## 2014-12-05 MED ADMIN — sodium chloride 0.9 % intravenous solution: INTRAVENOUS | @ 01:00:00 | NDC 00338004904

## 2014-12-05 MED ADMIN — electrolyte-A intravenous solution: INTRAVENOUS | @ 06:00:00 | NDC 00338022104

## 2014-12-05 MED ADMIN — sodium chloride 0.9 % intravenous solution: ORAL | @ 06:00:00

## 2014-12-05 MED ADMIN — lanolin-oxyquin-pet, hydrophil topical ointment: SUBCUTANEOUS | @ 16:00:00 | NDC 09999989257

## 2014-12-05 MED ADMIN — propofol 10 mg/mL intravenous emulsion: ORAL | @ 06:00:00

## 2014-12-05 NOTE — Progress Notes (Signed)
[facility]  [place], [place] [address]    Medicine Progress Note    [name]  Date of service: 12/05/2014  Date of Admission:  11/29/2014    Assessment/ Plan:   Active Hospital Problems   (*Primary Problem)    Diagnosis    *Cellulitis of foot, left     Left foot is less warm.  WBC down by 50 % today on Unasyn and Vancomycin. Continue same.  We decided to take her off for the next 2 weeks since she stands at work-then restart after spring break on April 4th.      C. difficile diarrhea     Only 1 bowel movement between yesterday an today. Continue vancomycin orally and contact isolation. Will continue oral Vanc throughout her course of antibiotics.      Essential hypertension     Chronic     Not at goal. Losartan increased already. Will add Amlodipine 5. Goal <120/80      Type 2 diabetes mellitus     Chronic     Control much improved - A1C 5.2% in January. Continue current regimen      Obesity     Chronic     Portion control, has already changed diet         Disposition Planning: Home discharge    ________________________________________________________________________      Subjective:   Sleeping.  No new complaints. Daughter at bedside      Physical Exam:  Vital Signs:  BP 150/87 mmHg   Pulse 76   Temp(Src) 36.7 C (98.1 F)   Resp 18   Ht 1.702 m (5\' 7" )   Wt 103.738 kg (228 lb 11.2 oz)   BMI 35.81 kg/m2   SpO2 100%   LMP 10/24/2014 (Approximate)    General: appears in good health, moderately obese and no distress  Lungs: Clear to auscultation bilaterally.   Cardiovascular: regular rate and rhythm  Extremities: left foot less warm  Neurologic: Grossly normal    Input/Output  I/O last 24 hours: I/O current shift:   No intake or output data in the 24 hours ending 12/05/14 1457       Labs:  I have reviewed all lab results.  BMP:     Lab Results   Component Value Date    SODIUM 143 12/05/2014    POTASSIUM 3.7 12/05/2014    CHLORIDE 107 12/05/2014    CO2 29 12/05/2014    BUN 17  12/05/2014    CREATININE 1.02* 12/05/2014    GLUCOSECJ 79 12/05/2014    ANIONGAP 7 12/05/2014    GFR 58* 12/05/2014    CALCIUM 9.4 12/05/2014     CBC:     Lab Results   Component Value Date    WBC 5.6* 12/05/2014    HGB 12.0 12/05/2014    HCT 36.3 12/05/2014    PLTCNT 211 12/05/2014    SEDRATE 46* 10/24/2014    RBC 4.32 12/05/2014    MCV 84.1 12/05/2014    MCHC 33.1 12/05/2014    MCH 27.8* 12/05/2014    RDW 14.2 12/05/2014    MPV 8.9 12/05/2014       Imaging Studies:  No new imaging      PT/OT: No      Gerline Legacy, MD

## 2014-12-05 NOTE — Nurses Notes (Signed)
Rounding complete. ATB hung per order. Pt up to the bathroom to void. Denies any BM this date or yesterday.  Denies pain but reports feeling "stuffy" in her nose.  Denies any acute needs. Will follow.

## 2014-12-05 NOTE — Care Plan (Signed)
Problem: General Plan of Care(Adult,OB)  Goal: Plan of Care Review(Adult,OB)  The patient and/or their representative will communicate an understanding of their plan of care   Outcome: Ongoing (see interventions/notes)    12/05/14 0315   Coping/Psychosocial   Plan Of Care Reviewed With patient       Goal: Individualization/Patient Specific Goal(Adult/OB)  Outcome: Ongoing (see interventions/notes)    Problem: Skin Integrity Impairment, Risk/Actual (Adult)  Intervention: Prevent/Minimize Sheer/Friction Injuries    12/03/14 2030 12/05/14 0315   Positioning   Positioning/Transfer Devices --  pillows   Pressure Reduction Techniques frequent weight shift encouraged --          Goal: Identify Related Risk Factors and Signs and Symptoms  Related risk factors and signs and symptoms are identified upon initiation of Human Response Clinical Practice Guideline (CPG)   Outcome: Ongoing (see interventions/notes)    12/05/14 0315   Skin Integrity Impairment, Risk/Actual   Skin Integrity Impairment, Risk/Actual: Related Risk Factors edema   Signs and Symptoms (Skin Integrity Impairment) edema       Goal: Skin Integrity/Wound Healing  Patient will demonstrate the desired outcomes by discharge/transition of care.   Outcome: Ongoing (see interventions/notes)    12/05/14 0315   Skin Integrity Impairment, Risk/Actual (Adult)   Skin Integrity/Wound Healing making progress toward outcome         Problem: Pain, Acute (Adult)  Intervention: Monitor/Manage Analgesia    12/05/14 0315   Manage Acute Burn Pain   Bowel Intervention ambulation promoted;adequate fluid intake promoted;privacy promoted   Safety Interventions   Medication Review/Management medications reviewed   OTHER   Pain Intervention  Emotional Support       Intervention: Mutually Develop/Implement Acute Pain Management Plan    12/05/14 0315   Cognitive Interventions   Sensory Stimulation Regulation quiet environment promoted   OTHER   Pain Intervention  Emotional Support            Goal: Identify Related Risk Factors and Signs and Symptoms  Related risk factors and signs and symptoms are identified upon initiation of Human Response Clinical Practice Guideline (CPG)   Outcome: Ongoing (see interventions/notes)    12/05/14 0315   Pain, Acute   Related Risk Factors (Acute Pain) disease process       Goal: Acceptable Pain Control/Comfort Level  Patient will demonstrate the desired outcomes by discharge/transition of care.   Outcome: Outcome Achieved Date Met:  12/05/14    12/05/14 0315   Pain, Acute (Adult)   Acceptable Pain Control/Comfort Level achieves outcome

## 2014-12-06 LAB — VANCOMYCIN, TROUGH - BMC/JMC ONLY
DOSE VANCOMYCIN: 1000
TIME OF LAST DOSE: 0
VANCOMYCIN LAST DOSE DATE: 0
VANCOMYCIN, TROUGH: 20.9 ug/mL — ABNORMAL HIGH (ref 10.0–20.0)

## 2014-12-06 LAB — PERFORM POC FINGERSTICK GLUCOSE
BLD GLUCOSE POCT: 100 mg/dL (ref 60–100)
BLD GLUCOSE POCT: 63 mg/dL (ref 60–100)
BLD GLUCOSE POCT: 120 mg/dL — ABNORMAL HIGH (ref 60–100)
BLD GLUCOSE POCT: 84 mg/dL (ref 60–100)

## 2014-12-06 MED ORDER — VANCOMYCIN 750 MG/150 ML IN DEXTROSE 5 % INTRAVENOUS PIGGYBACK
750.00 mg | INJECTION | Freq: Two times a day (BID) | INTRAVENOUS | Status: DC
Start: 2014-12-06 — End: 2014-12-07
  Administered 2014-12-06: 750 mg via INTRAVENOUS
  Administered 2014-12-07: 0 mg via INTRAVENOUS
  Administered 2014-12-07 (×2): 750 mg via INTRAVENOUS
  Administered 2014-12-07: 0 mg via INTRAVENOUS
  Filled 2014-12-06 (×5): qty 150

## 2014-12-06 MED ADMIN — sodium chloride 0.9 % (flush) injection syringe: INTRAVENOUS | @ 07:00:00

## 2014-12-06 MED ADMIN — electrolyte-A intravenous solution: INTRAVENOUS | @ 01:00:00 | NDC 00338022104

## 2014-12-06 NOTE — Pharmacy (Signed)
[facility] Vancomycin Consult   [place], 49 y.o. female  Date of Birth:  1965-10-01  Date of Admission:  11/29/2014  MRN# [id number]    Current height: Height: 5\' 7"   Current weight: Weight: 103.738 kg (228 lb 11.2 oz)    Drug: Vancomycin  Indication: Cellulitis/Diabetic Foot Ulcer  Organism: Unknown  Desired Level: Trough- ~ 15 mcg/mL    Date RPh Current regimen  SCr mg/dl CrCl* ml/min Measured level mcg/ml Plan  Comments   12/04/14 AS Vancomycin 1,750 mg loading dose then 1,000 mg IV Q12H 1.11 78.4  Trough 3/13 at 0800 Predicted trough= 15.3   3/13 AG 1g q12h  85.3 20.9 Change to 750mg  q12h                                                          *Creatinine clearance is estimated by using the Cockcroft-Gault equation.    Please contact pharmacy with any questions.          12/06/2014, 11:26

## 2014-12-06 NOTE — Care Plan (Signed)
Problem: General Plan of Care(Adult,OB)  Goal: Plan of Care Review(Adult,OB)  The patient and/or their representative will communicate an understanding of their plan of care   Outcome: Ongoing (see interventions/notes)    12/06/14 0408   Coping/Psychosocial   Plan Of Care Reviewed With patient   Given Unasyn and Vancomycin IVBP and PO vancomycin for c-diff. Up to bathroom independently.     Problem: Skin Integrity Impairment, Risk/Actual (Adult)  Intervention: Prevent/Minimize Sheer/Friction Injuries    12/05/14 2100   Positioning   Positioning/Transfer Devices pillows   Pressure Reduction Techniques frequent weight shift encouraged;turned/repositioned;weight shift assistance provided   Skin Interventions   Pressure Reduction Devices specialty bed utilized;pressure-redistributing mattress utilized         Goal: Identify Related Risk Factors and Signs and Symptoms  Related risk factors and signs and symptoms are identified upon initiation of Human Response Clinical Practice Guideline (CPG)   Outcome: Ongoing (see interventions/notes)    12/06/14 0408   Skin Integrity Impairment, Risk/Actual   Skin Integrity Impairment, Risk/Actual: Related Risk Factors infection/disease process   Signs and Symptoms (Skin Integrity Impairment) edema;inflammation       Goal: Skin Integrity/Wound Healing  Patient will demonstrate the desired outcomes by discharge/transition of care.   Outcome: Ongoing (see interventions/notes)    12/06/14 0408   Skin Integrity Impairment, Risk/Actual (Adult)   Skin Integrity/Wound Healing making progress toward outcome         Problem: Pain, Acute (Adult)  Intervention: Monitor/Manage Analgesia    12/05/14 0315 12/05/14 2100   Manage Acute Burn Pain   Bowel Intervention --  ambulation promoted   Safety Interventions   Medication Review/Management --  medications reviewed   OTHER   Pain Intervention  Emotional Support --        Intervention: Mutually Develop/Implement Acute Pain Management Plan   12/05/14 0315 12/05/14 2100   Cognitive Interventions   Sensory Stimulation Regulation --  care clustered;lighting decreased;quiet environment promoted   OTHER   Pain Intervention  Emotional Support --        Intervention: Support/Optimize Psychosocial Response to Acute Pain    12/05/14 2100   Coping Strategies   Supportive Measures active listening utilized   [place] support provided   Trust Relationship/Rapport care explained;reassurance provided;thoughts/feelings acknowledged         Goal: Identify Related Risk Factors and Signs and Symptoms  Related risk factors and signs and symptoms are identified upon initiation of Human Response Clinical Practice Guideline (CPG)   Outcome: Ongoing (see interventions/notes)    12/01/14 0535   Pain, Acute   Signs and Symptoms (Acute Pain) BADLs/IADLs reluctance/inability to perform;questions meaning of pain

## 2014-12-06 NOTE — Progress Notes (Signed)
[facility]  [place], [place] [address]    Medicine Progress Note    [name]  Date of service: 12/06/2014  Date of Admission:  11/29/2014    Assessment/ Plan:   Active Hospital Problems   (*Primary Problem)    Diagnosis    *Cellulitis of foot, left     Left foot less warm.  WBC norma on Unasyn and Vancomycin. Continue same.  We decided to take her off for the next 2 weeks since she stands at work-then restart after spring break on April 4th.  If foot continues to improve she might be able to be discharged after another couple of days      C. difficile diarrhea     Only 1 bowel movement. Continue vancomycin orally and contact isolation. Will continue oral Vanc throughout her course of antibiotics.      Essential hypertension     Chronic     Not at goal. Losartan increased already. Will add Amlodipine 5. Goal <120/80      Type 2 diabetes mellitus     Chronic     Control much improved - A1C 5.2% in January. Continue current regimen      Obesity     Chronic     Portion control, has already changed diet           DVT/PE Prophylaxis: Lovenox    Disposition Planning: Home discharge    ________________________________________________________________________      Subjective:   She is down today because she wants to go home but she understands the need to get this infection cleared once and for all. She stands all day at work on her feet        Physical Exam:  Vital Signs:  BP 155/83 mmHg   Pulse 94   Temp(Src) 36.8 C (98.2 F)   Resp 16   Ht 1.702 m (5\' 7" )   Wt 103.738 kg (228 lb 11.2 oz)   BMI 35.81 kg/m2   SpO2 100%   LMP 10/24/2014 (Approximate)    General: appears in good health, moderately obese and no distress  Lungs: Clear to auscultation bilaterally.   Cardiovascular: regular rate and rhythm  Abdomen: Soft, non-tender, Bowel sounds normal  Extremities: (L) foot is still slightly warm c/t right. Minimal erythema on dorsum near the entry wound for the splinter  Neurologic:  Grossly normal    Input/Output  I/O last 24 hours: I/O current shift:     Intake/Output Summary (Last 24 hours) at 12/06/14 1755  Last data filed at 12/05/14 2200   Gross per 24 hour   Intake    465 ml   Output      0 ml   Net    465 ml          Labs:  Vanc trough 20    Imaging Studies:    No new imaging      PT/OT: No      Gerline Legacy, MD

## 2014-12-07 LAB — BASIC METABOLIC PROFILE - BMC/JMC ONLY
ANION GAP: 4 mmol/L (ref 3–11)
BUN: 15 mg/dL (ref 6–22)
CALCIUM: 9.3 mg/dL (ref 8.5–10.5)
CARBON DIOXIDE: 28 mmol/L (ref 22–32)
CHLORIDE: 104 mmol/L (ref 101–111)
CREATININE: 0.94 mg/dL (ref 0.53–1.00)
ESTIMATED GLOMERULAR FILTRATION RATE: >60 mL/min (ref >60)
GLUCOSE: 96 mg/dL (ref 70–110)
POTASSIUM: 4.1 mmol/L (ref 3.5–5.0)
SODIUM: 136 mmol/L (ref 136–145)

## 2014-12-07 LAB — PERFORM POC FINGERSTICK GLUCOSE
BLD GLUCOSE POCT: 94 mg/dL (ref 60–100)
BLD GLUCOSE POCT: 73 mg/dL (ref 60–100)
BLD GLUCOSE POCT: 137 mg/dL — ABNORMAL HIGH (ref 60–100)

## 2014-12-07 MED ORDER — AMOXICILLIN 875 MG-POTASSIUM CLAVULANATE 125 MG TABLET
1.00 | ORAL_TABLET | Freq: Two times a day (BID) | ORAL | Status: AC
Start: 2014-12-07 — End: 2014-12-17

## 2014-12-07 MED ORDER — VANCOMYCIN 25 MG/ML ORAL LIQUID WITH CHERRY FLAVORING
250.00 mg | Freq: Four times a day (QID) | ORAL | Status: AC
Start: 2014-12-07 — End: 2014-12-21

## 2014-12-07 MED ORDER — AMLODIPINE 5 MG TABLET
5.0000 mg | ORAL_TABLET | Freq: Every day | ORAL | Status: DC
Start: 2014-12-07 — End: 2016-06-16

## 2014-12-07 MED ORDER — DICLOFENAC SODIUM 75 MG TABLET,DELAYED RELEASE
75.00 mg | DELAYED_RELEASE_TABLET | Freq: Two times a day (BID) | ORAL | Status: DC
Start: 2014-12-07 — End: 2016-06-16

## 2014-12-07 MED ORDER — SACCHAROMYCES BOULARDII 250 MG CAPSULE
250.00 mg | ORAL_CAPSULE | Freq: Two times a day (BID) | ORAL | Status: DC
Start: 2014-12-07 — End: 2016-06-16

## 2014-12-07 MED ADMIN — ALBUTEROL 5 MG INHALATION: ORAL | @ 09:00:00 | NDC 00487990130

## 2014-12-07 MED ADMIN — heparin lock flush (porcine) 10 unit/mL intravenous solution: INTRAVENOUS | @ 17:00:00

## 2014-12-07 MED ADMIN — sodium chloride 0.9 % intravenous solution: ORAL | @ 09:00:00 | NDC 00338004904

## 2014-12-07 NOTE — Nurses Notes (Signed)
RUE double lumen PICC removed. Measured 41cm and intact. Pt tolerated well. Applied pressure for 1 minute, then instructed patient to hold another minute. Pt was also instructed not to lift anything over 5lbs so clot can form and will not become dislodged. Will continue to monitor.

## 2014-12-07 NOTE — Discharge Summary (Signed)
[facility]  [place], [place] [address]    DISCHARGE SUMMARY      PATIENT NAMELendora, [name]  MRN:  [id number]  DOB:  Jul 29, 1966    ADMISSION DATE:  11/29/2014  DISCHARGE DATE:  12/07/2014    ATTENDING PHYSICIAN: Inocente Salles, DO  PRIMARY CARE PHYSICIAN: Candee Furbish, MD     ADMISSION DIAGNOSIS: Cellulitis of foot, left  Chief Complaint   Patient presents with    Foot Pain       DISCHARGE DIAGNOSIS:   Hospital Problems) (* Primary Problem)    Diagnosis Date Noted    *Cellulitis of foot, left 11/29/2014     Left foot less warm.  WBC norma on Unasyn and Vancomycin. Continue same.  We decided to take her off for the next 2 weeks since she stands at work-then restart after spring break on April 4th.  If foot continues to improve she might be able to be discharged after another couple of days      C. difficile diarrhea 12/02/2014     Only 1 bowel movement. Continue vancomycin orally and contact isolation. Will continue oral Vanc throughout her course of antibiotics.      Essential hypertension 11/30/2014     Not at goal. Losartan increased already. Will add Amlodipine 5. Goal <120/80      Obesity 11/30/2014     Portion control, has already changed diet      Type 2 diabetes mellitus 10/25/2014     Control much improved - A1C 5.2% in January. Continue current regimen        Resolved Hospital Problems    Diagnosis Date Noted Date Resolved   No resolved problems to display.     There are no active non-hospital problems to display for this patient.     DISCHARGE MEDICATIONS:     Current Discharge Medication List      START taking these medications.       Details    amLODIPine 5 mg Tablet   Commonly known as:  NORVASC    5 mg, Oral, DAILY   Qty:  30 Tab   Refills:  0       amoxicillin-pot clavulanate 875-125 mg Tablet   Commonly known as:  AUGMENTIN    1 Tab, Oral, EVERY 12 HOURS   Qty:  20 Tab   Refills:  0       diclofenac sodium 75 mg Tablet, Delayed Release (E.C.)   Commonly known as:   VOLTAREN    75 mg, Oral, 2 TIMES DAILY   Qty:  60 Tab   Refills:  0       Saccharomyces boulardii 250 mg Capsule   Commonly known as:  FLORASTOR    250 mg, Oral, 2 TIMES DAILY   Qty:  30 Cap   Refills:  0       vancomycin 50 mg/mL Liquid   Commonly known as:  VANCOCIN    250 mg, Oral, 4 TIMES DAILY   Qty:  560 mL   Refills:  0         CONTINUE these medications which have CHANGED during your visit.       Details    ALPRAZolam 0.25 mg Tablet   Commonly known as:  XANAX   What changed:  when to take this    0.25 mg, Oral, NIGHTLY PRN   Qty:  12 Tab   Refills:  0  CONTINUE these medications - NO CHANGES were made during your visit.       Details    CLARITIN 10 mg Tablet   Generic drug:  loratadine    10 mg, Oral, DAILY PRN   Refills:  0       glipiZIDE 5 mg Tablet   Commonly known as:  GLUCOTROL    2.5 mg, Oral, 2 TIMES DAILY BEFORE MEALS, Take 30 minutes before meals    Refills:  0       Losartan-Hydrochlorothiazide 50-12.5 mg Tablet    1 Tab, Oral, DAILY   Refills:  0         STOP taking these medications.          silver sulfADIAZINE 1 % Cream   Commonly known as:  SILVADENE           DISCHARGE INSTRUCTIONS:     DISCHARGE INSTRUCTION - DIET   Diet: LOW FAT DIET      DISCHARGE INSTRUCTION - ACTIVITY   Activity: GRADUALLY INCREASE ACTIVITY AS TOLERATED      ASPIRIN NOT ORDERED AT THIS TIME             Follow-up Information     Follow up with Candee Furbish, MD. Schedule an appointment as soon as possible for a visit in 1 week.    Specialty:  Family Practice    Contact information:    [address]  [place] [address]  [phone]          Go to Silvano Bilis, DPM.    Specialty:  Podiatry    Why:  Has appointment on wednesday 12/09/2014    Contact information:    [address]  [place] [address]  [phone]          REASON FOR HOSPITALIZATION AND HOSPITAL COURSE:  This is a 50 y.o., female who presented with the following complaints :  [name] is a 49 y.o., White female who  presents with Left foot redness and pain going on for a week now. She was seen by a foot doctor 4 days ago and was given Levaquin and Silvadene cream. In spite of using both, the redness and pain in the left foot is spreading and getting worse. She is having subjective fevers at home. Denies nausea, vomiting, headache, shortness of breath, cough, chest pain, no other complaints.  She was initially started on clindamycin IV - MRI did not show osteomyelitis.  Dr. Dionne Milo saw her as an inpatient and recommended prolonged course of antibiotics and then if recurrent may need exploration for foreign body.  The patient imporved over the first several days, and then noted to have increased warmth of the left foot.  Her antibiotics were escalated to vancomycin and unasyn on Friday 12/04/2014 and she had dramatic improvement thereafter.  She was placed on florastor due to C diff during the admission.  She was anxious for discharge so she was let go.  She was sent home with 10 additional days of Augmentin, diclofenac for pain, Norvasc for blood pressure, and florastor. She has follow up with Dr. Dionne Milo on Wednesday, and with her PCP In one week.  PICC line removed as it was clotted off anyway.     SIGNIFICANT PHYSICAL FINDINGS: healed at discharge, but warmth and redness of the L 345 digits.   SIGNIFICANT LAB:   CBC  Diff   Lab Results   Component Value Date/Time  WBC 5.6* 12/05/2014 08:45 AM    HGB 12.0 12/05/2014 08:45 AM    HCT 36.3 12/05/2014 08:45 AM    PLATELET COUNT 211 12/05/2014 08:45 AM    SEDIMENTATION RATE 46* 10/24/2014 10:13 AM    RBC 4.32 12/05/2014 08:45 AM    MCV 84.1 12/05/2014 08:45 AM    MCHC 33.1 12/05/2014 08:45 AM    MCH 27.8* 12/05/2014 08:45 AM    RDW 14.2 12/05/2014 08:45 AM    MPV 8.9 12/05/2014 08:45 AM    No results found for: PMNS, LYMPHOCYTES, EOSINOPHIL, MONOCYTES, BASOPHILS, PMNABS, LYMPHSABS, EOSABS, MONOSABS, BASOSABS     Comprehensive Metabolic Profile    Lab Results   Component Value  Date/Time    SODIUM 136 12/07/2014 06:48 AM    POTASSIUM 4.1 12/07/2014 06:48 AM    CHLORIDE 104 12/07/2014 06:48 AM    CARBON DIOXIDE 28 12/07/2014 06:48 AM    ANION GAP 4 12/07/2014 06:48 AM    BUN 15 12/07/2014 06:48 AM    CREATININE 0.94 12/07/2014 06:48 AM    Lab Results   Component Value Date/Time    CALCIUM 9.3 12/07/2014 06:48 AM    TOTAL PROTEIN 6.9 10/24/2014 10:13 AM    AST (SGOT) 27 10/24/2014 10:13 AM    ALT (SGPT) 32 10/24/2014 10:13 AM          SIGNIFICANT RADIOLOGY:   Results for orders placed or performed during the hospital encounter of 11/29/14   XR FOOT LEFT SERIES     Status: None    Narrative    RADIOLOGIST: Gennie Alma, MD/bw     LEFT FOOT, 3 VIEWS:     CLINICAL HISTORY:  Erythema.  History of cellulitis.     COMMENTS: Radiographs of the left foot show no evidence of fracture,   dislocation, or other bony abnormality.         Impression       Normal study.            US VENOUS DOPPLER LOWER EXTREMITY LEFT     Status: None    Narrative    RADIOLOGIST: Austin Miles, MD/np     EXAMINATION: VENOUS DOPPLER ULTRASOUND OF THE LEFT LOWER EXTREMITY      HISTORY:  Swelling and erythema.     FINDINGS:  Grayscale, color and spectral Doppler imaging was utilized.    The left common femoral vein, femoral vein, and popliteal vein show normal   flow and augmentation and are fully compressible without any evidence of   deep venous thrombosis.       Impression       As above.            MRI FOOT LEFT WO/W IV CONTRAST     Status: None    Narrative    RADIOLOGIST: Theressa Millard, MD/pb     MRI OF THE LEFT FOOT WITHOUT AND WITH IV CONTRAST     REASON FOR EXAMINATION:  Ongoing cellulitis for 1 month, to assess for   osteomyelitis.  Specific area of concern is fifth metatarsal.     FINDINGS:  There is no evidence of osteomyelitis involving the fifth   metatarsal, or elsewhere in the foot.  There is soft tissue swelling seen   around the fifth metatarsal in the region of the head and neck region;   however, the  abnormal soft tissue thickening is confined to the soft   tissues without extension into the bones.  The fifth metatarsophalangeal   joints  are also within normal limits.       Impression       No MRI evidence of osteomyelitis.            IR PICC PLACEMENT     Status: None    Narrative    RADIOLOGIST: Lars Masson, MD/la     CLINICAL HISTORY:  Patient in need of long-term IV access.     PICC LINE INSERTION:       The nature, risks and benefits of PICC line placement were explained to   the patient.  Informed consent was obtained. The skin overlying the right   upper arm was prepped and draped in a sterile fashion. Local anesthesia   was obtained with 1% lidocaine.       Under sonographic guidance, the right basilic vein was punctured.  A 0.018   guidewire was advanced to the superior vena cava/right atrial junction   under fluoroscopic guidance. A dilator and peel-away sheath were placed   over the guidewire.  The dilator was removed.  A 5 French double lumen 39   cm PICC line was placed through the peel-away sheath over the guidewire.   The peel-away sheath and guidewire were removed. The catheter was flushed   and fixed in place.       Confirmation of proper positioning of the tip of the PICC line was made   under fluoroscopy.         Impression       Uncomplicated 5 French double lumen 39 cm PICC line placement via the   right basilic vein approach.              CONSULTATIONS: Dr. Dionne Milo  PROCEDURES PERFORMED: None    Isolation Status:  Contact C. Diff    COURSE IN HOSPITAL: As listed above.     DOES PATIENT HAVE ADVANCED DIRECTIVES:  No, Information Offered and Given    ADVANCED CARE PLANNING - Not applicable for this patient    CONDITION ON DISCHARGE: Alert, Oriented and VS Stable   General - well nourished female acute distress  HEENT - normocephalic, atraumatic  Heart - RRR, no murmur, rub, gallop  Lungs - Clear to auscultation bilaterally  Abdomen - soft, nondistended, nontender.  BS+  Extremities - no edema -  outline on the right 345 digits at the mtp joint - no erythema or warmth felt in this location  Skin - warm, dry, and pink  Psych - affect normal  Neuro - alert and oriented x 3, moves all extremities well.        DISCHARGE DISPOSITION:  Home discharge   //  Copies sent to Care Team       Relationship Specialty Notifications Start End    Candee Furbish, MD PCP - General Family Practice  10/24/14     Phone: [phone] Fax: [phone]         [place] [address]            Inocente Salles, DO  12/07/2014, 21:36

## 2014-12-07 NOTE — Nurses Notes (Signed)
Pt is alert and oriented. Tearful and looking out the window when I enter the room. HR Regular, Lungs clear. Bowel sounds active in all 4 quadrants. Pt ambulating independently. PICC to RUE, red port flushes without difficulty. Purple does not flush, Dr. Paulita Cradle made aware. Pulses normal palpable to RUE and RLE. No redness or edema noted to L foot. Pt able to ambulate well. Call bell within reach. No questions at this time. Will continue to monitor.

## 2014-12-07 NOTE — Nurses Notes (Signed)
Pt and spouse received discharge instructions. Pt verbalized understanding of instructions. No questions at this time  Pt discharged home via private auto to home.

## 2014-12-07 NOTE — Care Plan (Signed)
Problem: General Plan of Care(Adult,OB)  Goal: Plan of Care Review(Adult,OB)  The patient and/or their representative will communicate an understanding of their plan of care   Outcome: Ongoing (see interventions/notes)  Pt. Stable this a.m., denies c/o pain.  Picc line intact, iv antibiotics given per order.  Pt. Did self dressing and Silvadene to left foot.    Problem: Skin Integrity Impairment, Risk/Actual (Adult)  Goal: Identify Related Risk Factors and Signs and Symptoms  Related risk factors and signs and symptoms are identified upon initiation of Human Response Clinical Practice Guideline (CPG)   Outcome: Ongoing (see interventions/notes)  Goal: Skin Integrity/Wound Healing  Patient will demonstrate the desired outcomes by discharge/transition of care.   Outcome: Ongoing (see interventions/notes)    Problem: Pain, Acute (Adult)  Goal: Identify Related Risk Factors and Signs and Symptoms  Related risk factors and signs and symptoms are identified upon initiation of Human Response Clinical Practice Guideline (CPG)   Outcome: Ongoing (see interventions/notes)

## 2014-12-09 ENCOUNTER — Ambulatory Visit (INDEPENDENT_AMBULATORY_CARE_PROVIDER_SITE_OTHER): Payer: PRIVATE HEALTH INSURANCE | Admitting: Primary Podiatric Medicine

## 2014-12-09 ENCOUNTER — Encounter: Payer: Self-pay | Admitting: Primary Podiatric Medicine

## 2014-12-09 VITALS — BP 196/134 | HR 84 | Temp 96.3°F | Resp 16 | Ht 67.0 in | Wt 232.0 lb

## 2014-12-09 DIAGNOSIS — R234 Changes in skin texture: Secondary | ICD-10-CM

## 2014-12-09 DIAGNOSIS — E1142 Type 2 diabetes mellitus with diabetic polyneuropathy: Secondary | ICD-10-CM

## 2014-12-09 DIAGNOSIS — L03116 Cellulitis of left lower limb: Secondary | ICD-10-CM

## 2014-12-09 DIAGNOSIS — E1140 Type 2 diabetes mellitus with diabetic neuropathy, unspecified: Secondary | ICD-10-CM

## 2014-12-10 ENCOUNTER — Encounter: Payer: Self-pay | Admitting: Primary Podiatric Medicine

## 2014-12-10 NOTE — Progress Notes (Signed)
Filed Vitals:    12/09/14 1409   BP: 196/134   Pulse: 84   Temp: 96.3 F (35.7 C)   Resp: 16   SpO2: 100%       Active Ambulatory Problems     Diagnosis Date Noted   . No Active Ambulatory Problems     Resolved Ambulatory Problems     Diagnosis Date Noted   . No Resolved Ambulatory Problems     Past Medical History   Diagnosis Date   . Anxiety    . Depression    . Arthritis    . Irritable bowel syndrome    . Obesity    . Fatty liver    . White coat hypertension    . Diabetes mellitus    . Cellulitis         Reviewed vital signs, past medical history, social history, family history, medications and allergies with patient.    1. Type 2 diabetes mellitus with diabetic polyneuropathy     2. Fissure in skin of foot     3. Cellulitis of left lower extremity         SUBJECTIVE: The patient is a 49 year old female with a history  of diabetes, who is following up the fissure site which  developed over this left foot after pulling some skin from this  area a couple of weeks ago.  We started the patient on  antibiotics at her last visit but she relates that they did not  seem to lead to improvement and she actually needed to be  hospitalized in outside facility for IV antibiotics.  The  patient relates that it has largely resolved at this point.  The  patient has been return to her normal shoe gear.  Denies any  fever, chills, nausea, and vomiting or open lesion today.    OBJECTIVE FINDINGS: I reviewed vitals today.  The patient with  warm, dry, supple skin over this lower extremity.  The patient  continues with this fissure site on the lateral foot but it is  largely healed at this point, without any drainage with complete  resolved of the redness or cellulitis or irritation over this  site.  The patient without any other open lesions noted today.  The patient with palpable pulses and capillary refill time less  than 3 seconds.  Denies Tinel's sign or Mulder's sign but does  have decreased protective sensation over this  lower extremity  with monofilament.  The patient does have dorsally contracted  digits, 2 through 5 bilaterally leading to increased pressure at  these metatarsal heads.    ASSESSMENT:  1.  Diabetes with neuropathy.  2.  Fissure at this skin with cellulitis which is resolving.    PLAN: At this point, I advised the patient can return to her  normal shoe gear.  Should apply a small amount of moisturizer  over this site to allow for healing of this fissure sites.  I  discussed the importance of regular foot exams to prevent  complications and at this point, we will have patient return to  clinic in about four weeks for re-evaluation to debride any  hyperkeratotic lesions as needed.  Advised to continue to follow  up the primary care for this high blood pressure as well.

## 2015-01-06 ENCOUNTER — Encounter: Payer: Self-pay | Admitting: Primary Podiatric Medicine

## 2015-01-06 ENCOUNTER — Ambulatory Visit (INDEPENDENT_AMBULATORY_CARE_PROVIDER_SITE_OTHER): Payer: PRIVATE HEALTH INSURANCE | Admitting: Primary Podiatric Medicine

## 2015-01-06 VITALS — BP 153/92 | HR 87 | Temp 96.7°F | Wt 235.0 lb

## 2015-01-06 DIAGNOSIS — E1142 Type 2 diabetes mellitus with diabetic polyneuropathy: Secondary | ICD-10-CM

## 2015-01-06 DIAGNOSIS — E1140 Type 2 diabetes mellitus with diabetic neuropathy, unspecified: Secondary | ICD-10-CM

## 2015-01-06 DIAGNOSIS — L97421 Non-pressure chronic ulcer of left heel and midfoot limited to breakdown of skin: Secondary | ICD-10-CM

## 2015-01-06 NOTE — Progress Notes (Signed)
Filed Vitals:    01/06/15 1350   BP: 153/92   Pulse: 87   Temp: 96.7 F (35.9 C)       Active Ambulatory Problems     Diagnosis Date Noted   . No Active Ambulatory Problems     Resolved Ambulatory Problems     Diagnosis Date Noted   . No Resolved Ambulatory Problems     Past Medical History   Diagnosis Date   . Anxiety    . Depression    . Arthritis    . Irritable bowel syndrome    . Obesity    . Fatty liver    . White coat hypertension    . Diabetes mellitus    . Cellulitis         Reviewed vital signs, past medical history, social history, family history, medications and allergies with patient.    1. Type 2 diabetes mellitus with diabetic polyneuropathy     2. Ulcer of heel and midfoot, left, limited to breakdown of skin         SUBJECTIVE: The patient is a 49 year old female with a history  of diabetes with previous treatment for cellulitis and  ulceration over the plantar aspect of this left foot.  The  patient relates that she did get her diabetic shoes and has been  wearing them to work recently.  The patient relates that they  are comfortable and do seem to be wide enough periods, seems  like they are working well for the patient at this time.  The  patient returns to clinic for recheck over this left foot  due  to this previous ulceration, which has occurred a couple of  times at this site.    OBJECTIVE FINDINGS: I reviewed vitals today.  The patient with  warm, dry, supple skin over this lower extremity.  The patient  with this site, which does have some mild hyperkeratotic  buildup, but is closed at this point without any drainage or  subdermal hematoma formation.  Continues to be some mild  erythema over the dorsal aspect of this foot, but relates this  is likely from just getting off of work.  The patient with  palpable pulses with mild edema and capillary refill time less  than 3 seconds without claudication or ischemic changes.  The  patient with decreased protective sensation to this  lower  extremities with monofilament, but denies Tinel's sign or  Mulder's sign.  The patient with continued pronation and  tailor's bunion over this bilateral lower extremity.    ASSESSMENT:  1.  Diabetes with neuropathy.  2.  Recheck ulceration over the plantar aspect of this      left foot.    PLAN: At this point, this area is clean, dry, and stable today.  The patient has transitioned to diabetic shoes without incident.  Continued discussed the importance of blood glucose control and  regular foot exams and advised the patient to call with new  concerns or complaints.

## 2015-03-08 ENCOUNTER — Ambulatory Visit: Payer: PRIVATE HEALTH INSURANCE | Admitting: Primary Podiatric Medicine

## 2015-05-17 ENCOUNTER — Ambulatory Visit: Payer: PRIVATE HEALTH INSURANCE | Admitting: Primary Podiatric Medicine

## 2015-09-26 HISTORY — PX: HX HAND SURGERY: 2100001299

## 2015-09-26 HISTORY — PX: HX SHOULDER SURGERY: 2100001311

## 2016-03-31 HISTORY — PX: TRIGGER FINGER RELEASE: SHX641

## 2016-03-31 HISTORY — PX: SHOULDER SURGERY: SHX246

## 2016-06-16 ENCOUNTER — Emergency Department (HOSPITAL_BASED_OUTPATIENT_CLINIC_OR_DEPARTMENT_OTHER)
Admission: EM | Admit: 2016-06-16 | Discharge: 2016-06-16 | Disposition: A | Discharge status: Home / Self Care | Payer: No Typology Code available for payment source | Attending: Emergency Medicine | Admitting: Emergency Medicine

## 2016-06-16 ENCOUNTER — Emergency Department (HOSPITAL_BASED_OUTPATIENT_CLINIC_OR_DEPARTMENT_OTHER): Payer: No Typology Code available for payment source

## 2016-06-16 ENCOUNTER — Encounter (HOSPITAL_BASED_OUTPATIENT_CLINIC_OR_DEPARTMENT_OTHER): Payer: Self-pay

## 2016-06-16 DIAGNOSIS — M1990 Unspecified osteoarthritis, unspecified site: Secondary | ICD-10-CM | POA: Insufficient documentation

## 2016-06-16 DIAGNOSIS — M7989 Other specified soft tissue disorders: Secondary | ICD-10-CM | POA: Insufficient documentation

## 2016-06-16 DIAGNOSIS — Z79899 Other long term (current) drug therapy: Secondary | ICD-10-CM | POA: Insufficient documentation

## 2016-06-16 DIAGNOSIS — Z9049 Acquired absence of other specified parts of digestive tract: Secondary | ICD-10-CM | POA: Insufficient documentation

## 2016-06-16 DIAGNOSIS — E669 Obesity, unspecified: Secondary | ICD-10-CM | POA: Insufficient documentation

## 2016-06-16 DIAGNOSIS — Z885 Allergy status to narcotic agent status: Secondary | ICD-10-CM | POA: Insufficient documentation

## 2016-06-16 DIAGNOSIS — F329 Major depressive disorder, single episode, unspecified: Secondary | ICD-10-CM | POA: Insufficient documentation

## 2016-06-16 DIAGNOSIS — L03116 Cellulitis of left lower limb: Secondary | ICD-10-CM | POA: Insufficient documentation

## 2016-06-16 DIAGNOSIS — F419 Anxiety disorder, unspecified: Secondary | ICD-10-CM | POA: Insufficient documentation

## 2016-06-16 DIAGNOSIS — E119 Type 2 diabetes mellitus without complications: Secondary | ICD-10-CM | POA: Insufficient documentation

## 2016-06-16 DIAGNOSIS — M79672 Pain in left foot: Secondary | ICD-10-CM | POA: Insufficient documentation

## 2016-06-16 DIAGNOSIS — Z7984 Long term (current) use of oral hypoglycemic drugs: Secondary | ICD-10-CM | POA: Insufficient documentation

## 2016-06-16 DIAGNOSIS — K760 Fatty (change of) liver, not elsewhere classified: Secondary | ICD-10-CM | POA: Insufficient documentation

## 2016-06-16 DIAGNOSIS — K589 Irritable bowel syndrome without diarrhea: Secondary | ICD-10-CM | POA: Insufficient documentation

## 2016-06-16 MED ORDER — AMOXICILLIN 875 MG-POTASSIUM CLAVULANATE 125 MG TABLET
1.00 | ORAL_TABLET | Freq: Two times a day (BID) | ORAL | 0 refills | Status: AC
Start: 2016-06-16 — End: 2016-06-26

## 2016-06-16 MED ORDER — AMOXICILLIN 875 MG-POTASSIUM CLAVULANATE 125 MG TABLET
ORAL_TABLET | ORAL | Status: AC
Start: 2016-06-16 — End: 2016-06-16
  Administered 2016-06-16: 1 via ORAL
  Filled 2016-06-16: qty 1

## 2016-06-16 MED ORDER — AMOXICILLIN 875 MG-POTASSIUM CLAVULANATE 125 MG TABLET
1.00 | ORAL_TABLET | ORAL | Status: AC
Start: 2016-06-16 — End: 2016-06-16

## 2016-06-16 MED ADMIN — amoxicillin 875 mg-potassium clavulanate 125 mg tablet: ORAL | @ 05:00:00

## 2016-06-16 NOTE — ED Nurses Note (Signed)
Patient discharged home with family.  AVS reviewed with patient/care giver.  A written copy of the AVS and discharge instructions was given to the patient/care giver.  Questions sufficiently answered as needed.  Patient/care giver encouraged to follow up with PCP as indicated.  In the event of an emergency, patient/care giver instructed to call 911 or go to the nearest emergency room.        Current Discharge Medication List      START taking these medications.       Details    amoxicillin-pot clavulanate 875-125 mg Tablet   Commonly known as:  AUGMENTIN    1 Tab, Oral, Q12H   Qty:  20 Tab   Refills:  0         CONTINUE these medications - NO CHANGES were made during your visit.       Details    CLARITIN 10 mg Tablet   Generic drug:  loratadine    10 mg, Oral, Daily PRN   Refills:  0       DULoxetine 60 mg Capsule, Delayed Release(E.C.)   Commonly known as:  CYMBALTA DR    60 mg, Oral, Daily   Refills:  0       glipiZIDE 5 mg Tablet   Commonly known as:  GLUCOTROL    2.5 mg, Oral, 2x/day AC, Take 30 minutes before meals   Refills:  0       losartan-hydrochlorothiazide 50-12.5 mg Tablet   Commonly known as:  HYZAAR    1 Tab, Oral, Daily   Refills:  0           Patient is alert and oriented. Patient shows no signs of distress and denies any new pain. Patient verbalized understanding of discharge instructions with prescription administration. Patient ambulates from ER without assistance.

## 2016-06-16 NOTE — ED Provider Notes (Signed)
Malachy Chamber, MD  [facility]  Emergency Department Visit Note    Date:  06/16/2016  Primary care provider:  Candee Furbish, MD  Means of arrival:  private car  History obtained from: patient  History limited by: none    Chief Complaint:  Foot pain     HISTORY OF PRESENT ILLNESS     [name], date of birth 03-24-1966, is a 50 y.o. female who presents to the Emergency Department complaining of foot pain. The patient states that she began to experience pain and swelling to the sole of her left foot this morning. She states that her pain is constant and rates it as 6/10. She denies any known injury to her foot and states that "it wasn't bothering me at all when I went to bed." She reports a history of diabetes.     REVIEW OF SYSTEMS     The pertinent positive and negative symptoms are as per HPI. All other systems reviewed and are negative.     PATIENT HISTORY     Past Medical History:  Past Medical History:   Diagnosis Date    Anxiety     Arthritis     Cellulitis 10/24/14    left foot    Depression     Diabetes mellitus ([place])     Fatty liver     Headache     Irritable bowel syndrome     Macular edema     Obesity     White coat hypertension        Past Surgical History:  Past Surgical History:   Procedure Laterality Date    Hx cholecystectomy  09/10/96    Hx other  1996    Hx pelvic laparoscopy  05/24/98    Hx tubal ligation  09/10/2012       Family History:  Family Medical History     Problem Relation (Age of Onset)    Cancer Maternal Grandmother    Hypertension Maternal Uncle          Social History:  Social History   Substance Use Topics    Smoking status: Never Smoker    Smokeless tobacco: None    Alcohol use No     History   Drug Use No       Medications:  Previous Medications    DULOXETINE (CYMBALTA DR) 60 MG ORAL CAPSULE, DELAYED RELEASE(E.C.)    Take 60 mg by mouth Once a day    GLIPIZIDE (GLUCOTROL) 5 MG ORAL TABLET    Take 2.5 mg by mouth Twice a day before meals Take 30  minutes before meals    LORATADINE (CLARITIN) 10 MG ORAL TABLET    Take 10 mg by mouth Once per day as needed     LOSARTAN-HYDROCHLOROTHIAZIDE 50-12.5 MG ORAL TABLET    Take 1 Tab by mouth Once a day        Allergies:  Allergies   Allergen Reactions    Vicodin [Hydrocodone-Acetaminophen] Itching       PHYSICAL EXAM     Vitals:  Filed Vitals:    06/16/16 0355   BP: (!) 209/117   Pulse: (!) 104   Resp: 18   Temp: 36.9 C (98.4 F)   SpO2: 100%       Pulse ox  100% on None (Room Air) interpreted by me as: Normal    Constitutional: The patient is alert and oriented to person, place, and time. Well-developed and well-nourished.  HENT: Atraumatic, normocephalic head. Mucous membranes moist. Nares unremarkable. Oropharynx shows no erythema or exudate.   Eyes: Pupils equal and round, reactive to light. No scleral icterus. Normal conjunctiva. Extraocular movements are intact.  Neck: Supple, non-tender, no nuchal rigidity, no adenopathy.   Lungs: Clear to auscultation bilaterally. Symmetric and equal expansion. No respiratory distress or retractions.  Cardiovascular: Heart is S1-S2 regular rate and regular rhythm without murmur click or rub.  Abdomen:  Soft, non-distended. No tenderness to palpation without evidence of rebound or guarding. No pulsatile masses. No organomegaly.   Extremities: Full range of motion, no clubbing, cyanosis, or edema. Pulses 2+, capillary refill <2 seconds. Swelling to the bottom lateral aspect of the left foot concerning for foreign body or infection, no external erythema or lymphangitis.   Spine: No midline or paraspinal muscle tenderness to palpation. No step-off.   Skin: Warm and dry. No cyanosis, jaundice, rash or lesion.  Neurologic: Alert and oriented x3. Normal facial symmetry and speech, Normal upper and lower extremity strength, and grossly normal sensation.     DIFFERENTIAL DIAGNOSES      Foreign body    Abscess    Puncture wound to foot    Hyper/hypoglycemia     DIAGNOSTIC STUDIES       Labs:    Results for orders placed or performed during the hospital encounter of 06/16/16   POC FINGERSTICK GLUCOSE - [facility] (RESULTS)   Result Value Ref Range    GLUCOSE, POC 180 (H) 60 - 100 mg/dL     Labs reviewed and interpreted by me.    Radiology:    XR FOOT LEFT SERIES, 3-VIEWS: Foreign body.   Interpreted by me.    ED PROGRESS NOTE / [facility] records reviewed by me:  I have reviewed the patient's recent past medical history. Nurse's notes reviewed.      Orders Placed This Encounter    XR FOOT LEFT SERIES    amoxicillin-clavulanate (AUGMENTIN) 875-125mg  per tablet       Left foot x-ray ordered.    4:13 AM - Initial evaluation completed at this time.     4:33 AM - On recheck, the patient was treated with Augmentin PO.  I have explained the results of the diagnostic studies and have discussed the diagnosis, disposition, and follow-up plan. Return precautions to the Emergency Department were discussed. The patient understood and is in accordance with the treatment plan at this time. All of her questions have been answered to her satisfaction. The patient is in stable condition at the time of discharge.         Pre-Disposition Vitals:  Filed Vitals:    06/16/16 0355   BP: (!) 209/117   Pulse: (!) 104   Resp: 18   Temp: 36.9 C (98.4 F)   SpO2: 100%       1. I have screened the patient for tobacco use and the patient is a tobacco non-user.     CLINICAL IMPRESSION     Encounter Diagnosis   Name Primary?    Cellulitis of foot, left Yes     DISPOSITION/PLAN     Discharged        Prescriptions:     New Prescriptions    AMOXICILLIN-POT CLAVULANATE (AUGMENTIN) 875-125 MG ORAL TABLET    Take 1 Tab by mouth Every 12 hours for 10 days       Follow-Up:     Nicolasa Ducking, MD  [address]  [place] [address]  [phone]    Call today  for immediate follow up, For wound re-check    Candee Furbish, [address]  [place] [address]  [phone]    Call in 2 days  for  follow up and wound check      Condition at Disposition: Stable        SCRIBE [place] scribed for Malachy Chamber, MD on 06/16/2016 at 4:13 AM.     Documentation assistance provided for Malachy Chamber, MD  by Kimberlee Nearing, SCRIBE. Information recorded by the scribe was done at my direction and has been reviewed and validated by me Malachy Chamber, MD.

## 2016-06-16 NOTE — ED Triage Notes (Signed)
Patient has pain and swelling to sole of left foot that started last night

## 2016-06-19 ENCOUNTER — Encounter: Payer: Self-pay | Admitting: Primary Podiatric Medicine

## 2016-06-19 ENCOUNTER — Ambulatory Visit: Payer: PRIVATE HEALTH INSURANCE | Attending: Primary Podiatric Medicine | Admitting: Primary Podiatric Medicine

## 2016-06-19 ENCOUNTER — Other Ambulatory Visit
Admission: RE | Admit: 2016-06-19 | Discharge: 2016-06-19 | Disposition: A | Discharge status: Home / Self Care | Payer: PRIVATE HEALTH INSURANCE | Source: Ambulatory Visit | Attending: Primary Podiatric Medicine | Admitting: Primary Podiatric Medicine

## 2016-06-19 VITALS — BP 189/118 | HR 101 | Temp 98.8°F | Ht 66.0 in | Wt 273.0 lb

## 2016-06-19 DIAGNOSIS — S90852A Superficial foreign body, left foot, initial encounter: Secondary | ICD-10-CM | POA: Insufficient documentation

## 2016-06-19 DIAGNOSIS — L02612 Cutaneous abscess of left foot: Secondary | ICD-10-CM | POA: Insufficient documentation

## 2016-06-19 NOTE — Addendum Note (Signed)
Addended by: Nicolasa Ducking on: 06/19/2016 04:41 PM     Modules accepted: Orders

## 2016-06-19 NOTE — Progress Notes (Signed)
Vitals:    06/19/16 1535   BP: (!) 189/118   Pulse: (!) 101   Temp: 98.8 F (37.1 C)       Active Ambulatory Problems     Diagnosis Date Noted   . No Active Ambulatory Problems     Resolved Ambulatory Problems     Diagnosis Date Noted   . No Resolved Ambulatory Problems     Past Medical History:   Diagnosis Date   . Anxiety    . Arthritis    . Cellulitis    . Depression    . Diabetes mellitus    . Fatty liver    . Irritable bowel syndrome    . Obesity    . White coat hypertension         Reviewed vital signs, past medical history, social history, family history, medications and allergies with patient.    1. Foreign body in left foot, initial encounter  X-ray foot left AP lateral and oblique   2. Abscess of left foot  X-ray foot left AP lateral and oblique       SUBJECTIVE: Kelli Carlson is a 50 year old female with a  history of diabetes, anxiety, and previous treatment for  cellulitis and ulceration on the lateral aspect of this left  foot.  Patient is seen today with new complaints of abscess and  cellulitis on this lateral left foot.  Patient relates that over  the last several days, she noticed increased swelling and pain  on this left foot.  Patient presented to emergency room, where  x-rays were taken, showing small foreign body at this lateral  left foot.  Patient was subsequently started on antibiotics.  Patient did not get culture at that time.  Patient relates that  it has improved with this course of Augmentin.    OBJECTIVE FINDINGS: Patient with warm, dry, supple skin over  this lower extremity.  Patient with this site with noticeable  dark foreign body consistent with a splinter, but more  consistent with lead on this lateral left foot adjacent to this  fifth metatarsal.  There was a small collection of fluid at this  site, which was released.  There was no ascending cellulitis at  this point.  There is small collection of serous fluid.  Patient  with palpable pulses and mild edema, but without  ischemic  changes or claudication.  Patient with protective sensation  decreased to this lower extremity with monofilament and some  numbness in these feet.  Patient has a subsequent ulceration  site measuring approximately 2.5 x 0.6 x 0.1 cm.    ASSESSMENT: Foreign body with abscess, left lateral foot.    PLAN: At this point, following consent, we did release this site  with a suture removal kit and a 15 blade, flushing with sterile  saline.  Before this, we did take a culture from this site.  We  did notice a small foreign body from this subcutaneous tissue at  this level.  We will order an x-ray to evaluate for removal of  this foreign body and we did get the culture once again.  Advised to dress with silver alginate, gauze, and Medipore tape  at this point.  Advised to use a surgical shoe up from now on.  Advised that she can be off work until followup in one week for  re-evaluation of this site.  Advised to complete her course of  antibiotics.

## 2016-06-26 ENCOUNTER — Encounter: Payer: Self-pay | Admitting: Primary Podiatric Medicine

## 2016-06-26 ENCOUNTER — Ambulatory Visit
Admission: RE | Admit: 2016-06-26 | Discharge: 2016-06-26 | Disposition: A | Discharge status: Home / Self Care | Payer: PRIVATE HEALTH INSURANCE | Source: Ambulatory Visit | Attending: Primary Podiatric Medicine | Admitting: Primary Podiatric Medicine

## 2016-06-26 ENCOUNTER — Ambulatory Visit: Payer: PRIVATE HEALTH INSURANCE | Attending: Primary Podiatric Medicine | Admitting: Primary Podiatric Medicine

## 2016-06-26 VITALS — BP 155/97 | HR 98 | Temp 97.8°F | Wt 274.0 lb

## 2016-06-26 DIAGNOSIS — L02612 Cutaneous abscess of left foot: Secondary | ICD-10-CM

## 2016-06-26 DIAGNOSIS — M7732 Calcaneal spur, left foot: Secondary | ICD-10-CM | POA: Insufficient documentation

## 2016-06-26 DIAGNOSIS — S90852A Superficial foreign body, left foot, initial encounter: Secondary | ICD-10-CM | POA: Insufficient documentation

## 2016-06-26 DIAGNOSIS — E1142 Type 2 diabetes mellitus with diabetic polyneuropathy: Secondary | ICD-10-CM

## 2016-06-26 DIAGNOSIS — S90852D Superficial foreign body, left foot, subsequent encounter: Secondary | ICD-10-CM

## 2016-06-26 DIAGNOSIS — E1140 Type 2 diabetes mellitus with diabetic neuropathy, unspecified: Secondary | ICD-10-CM | POA: Insufficient documentation

## 2016-06-26 DIAGNOSIS — M109 Gout, unspecified: Secondary | ICD-10-CM | POA: Insufficient documentation

## 2016-06-28 ENCOUNTER — Encounter: Payer: Self-pay | Admitting: Primary Podiatric Medicine

## 2016-06-28 ENCOUNTER — Ambulatory Visit: Payer: PRIVATE HEALTH INSURANCE | Admitting: Primary Podiatric Medicine

## 2016-06-28 ENCOUNTER — Ambulatory Visit: Payer: PRIVATE HEALTH INSURANCE | Attending: Primary Podiatric Medicine | Admitting: Primary Podiatric Medicine

## 2016-06-28 VITALS — BP 163/107 | HR 112 | Temp 97.8°F | Resp 20 | Wt 273.0 lb

## 2016-06-28 DIAGNOSIS — L97521 Non-pressure chronic ulcer of other part of left foot limited to breakdown of skin: Secondary | ICD-10-CM | POA: Insufficient documentation

## 2016-06-28 DIAGNOSIS — E1142 Type 2 diabetes mellitus with diabetic polyneuropathy: Secondary | ICD-10-CM

## 2016-06-28 DIAGNOSIS — E11621 Type 2 diabetes mellitus with foot ulcer: Secondary | ICD-10-CM | POA: Insufficient documentation

## 2016-06-28 DIAGNOSIS — S90852D Superficial foreign body, left foot, subsequent encounter: Secondary | ICD-10-CM

## 2016-06-28 MED ORDER — SULFAMETHOXAZOLE-TRIMETHOPRIM 800-160 MG PO TABS
1.0000 | ORAL_TABLET | Freq: Two times a day (BID) | ORAL | 0 refills | Status: AC
Start: 2016-06-28 — End: 2016-07-05

## 2016-06-28 NOTE — Progress Notes (Addendum)
Vitals:    06/28/16 1443   BP: (!) 163/107   Pulse: (!) 112   Resp: 20   Temp: 97.8 F (36.6 C)       Active Ambulatory Problems     Diagnosis Date Noted   . No Active Ambulatory Problems     Resolved Ambulatory Problems     Diagnosis Date Noted   . No Resolved Ambulatory Problems     Past Medical History:   Diagnosis Date   . Anxiety    . Arthritis    . Cellulitis    . Depression    . Diabetes mellitus    . Fatty liver    . Irritable bowel syndrome    . Obesity    . White coat hypertension         Reviewed vital signs, past medical history, social history, family history, medications and allergies with patient.    1. Diabetic peripheral neuropathy associated with type 2 diabetes mellitus     2. Skin ulcer of toe of left foot, limited to breakdown of skin     3. Foreign body in left foot, subsequent encounter         SUBJECTIVE: Kelli Carlson is a 50 year old female with a  history of diabetes, with a polyneuropathy who is seen for  followup over this left foot.  The patient was seen on Monday  for re-evaluation.  This area was well healed and remained well  healed.  The patient returned to work for the last couple of  days and has had increased swelling and some tenderness on the  dorsal aspect of this foot.  X-rays at that time showed that  there was still a retained foreign body on the plantar lateral  aspect of this foot.  The patient denies any drainage from this  area.  The patient relates that she also picked some skin from  this left third toe, but it went too deeply creating an  ulceration site at this level as well.  Denies any fever,  chills, nausea, and vomiting today.    OBJECTIVE FINDINGS: The patient with warm, dry, supple skin over  this lower extremity.  The patient with this new ulceration site  on the distal aspect of this left third toe, which is granular  in nature and irregular shaped which she removed part of the  skin.  Today, it measures approximately 1.2 x 0.4 x 0.1 cm and  is  superficial.  On the lateral aspect of this foot, this site  continues to be well healed at this point, although there is  some edema over this previous abscess site.  The patient with  decreased pulses and mild edema, but without ischemic changes or  claudication.  The patient with decreased protective sensation  with monofilament over this lower extremity with numbness noted.  The patient today denies any acute pain on palpation at this  plantar lateral sites and most of the irritation is on the  dorsal aspect of this foot where there is this edema.    ASSESSMENT:  1.  Diabetes with significant polyneuropathy.  2.  Ulceration, left third toe.  3.  Retained foreign body with a stable site on this      lateral left foot.    PLAN: At this point, as this continues to be well healed without  much tenderness over this foreign body site, we will continue to  observe it.  We will start patient on a short course  of  antibiotics to see if this will help calm it down to some  degree.  I discussed that we will continue  this area  becomes more problematic, we may have to consider surgical  resection, so we can have a C-arm to locate this very is tiny  foreign body at this point.  I believe it may have been   initially caused this site.  The patient also with this new  ulceration site advised to dress with silver alginate, gauze,  and tape at this level and to continue with the surgical shoe.

## 2016-07-03 ENCOUNTER — Encounter: Payer: Self-pay | Admitting: Primary Podiatric Medicine

## 2016-07-03 ENCOUNTER — Telehealth: Payer: Self-pay

## 2016-07-03 ENCOUNTER — Ambulatory Visit
Admission: RE | Admit: 2016-07-03 | Discharge: 2016-07-03 | Disposition: A | Discharge status: Home / Self Care | Payer: PRIVATE HEALTH INSURANCE | Source: Ambulatory Visit | Attending: Primary Podiatric Medicine | Admitting: Primary Podiatric Medicine

## 2016-07-03 ENCOUNTER — Other Ambulatory Visit: Payer: Self-pay | Admitting: Primary Podiatric Medicine

## 2016-07-03 ENCOUNTER — Ambulatory Visit: Payer: PRIVATE HEALTH INSURANCE | Attending: Primary Podiatric Medicine | Admitting: Primary Podiatric Medicine

## 2016-07-03 VITALS — BP 170/86 | HR 92 | Temp 98.6°F | Resp 18 | Wt 275.0 lb

## 2016-07-03 DIAGNOSIS — M109 Gout, unspecified: Secondary | ICD-10-CM | POA: Insufficient documentation

## 2016-07-03 DIAGNOSIS — M79671 Pain in right foot: Secondary | ICD-10-CM | POA: Insufficient documentation

## 2016-07-03 DIAGNOSIS — M10371 Gout due to renal impairment, right ankle and foot: Secondary | ICD-10-CM | POA: Insufficient documentation

## 2016-07-03 DIAGNOSIS — E1142 Type 2 diabetes mellitus with diabetic polyneuropathy: Secondary | ICD-10-CM

## 2016-07-03 DIAGNOSIS — E1140 Type 2 diabetes mellitus with diabetic neuropathy, unspecified: Secondary | ICD-10-CM | POA: Insufficient documentation

## 2016-07-03 LAB — CBC AND DIFFERENTIAL
Basophils %: 0.8 % (ref 0.0–3.0)
Basophils Absolute: 0.1 10*3/uL (ref 0.0–0.3)
Eosinophils %: 0.7 % (ref 0.0–7.0)
Eosinophils Absolute: 0.1 10*3/uL (ref 0.0–0.8)
Hematocrit: 36.7 % (ref 36.0–48.0)
Hemoglobin: 12.5 gm/dL (ref 12.0–16.0)
Lymphocytes Absolute: 1.9 10*3/uL (ref 0.6–5.1)
Lymphocytes: 22.2 % (ref 15.0–46.0)
MCH: 31 pg (ref 28–35)
MCHC: 34 gm/dL (ref 31–36)
MCV: 92 fL (ref 80–100)
MPV: 9.1 fL (ref 6.0–10.0)
Monocytes Absolute: 0.6 10*3/uL (ref 0.1–1.7)
Monocytes: 6.6 % (ref 3.0–15.0)
Neutrophils %: 69.8 % (ref 42.0–78.0)
Neutrophils Absolute: 5.9 10*3/uL (ref 1.7–8.6)
PLT CT: 221 10*3/uL (ref 130–440)
RBC: 3.99 10*6/uL (ref 3.80–5.00)
RDW: 12.9 % (ref 10.5–14.5)
WBC: 8.4 10*3/uL (ref 4.00–11.00)

## 2016-07-03 LAB — RHEUMATOID FACTOR: Rheumatoid Factor: <15 IU/mL (ref 0.0–29.9)

## 2016-07-03 LAB — URIC ACID: Uric acid: 8.4 mg/dL — ABNORMAL HIGH (ref 2.6–6.0)

## 2016-07-03 LAB — SEDIMENTATION RATE: Sed Rate: 85 mm/hr — ABNORMAL HIGH (ref 0–20)

## 2016-07-03 MED ORDER — COLCHICINE 0.6 MG PO TABS
0.6000 mg | ORAL_TABLET | Freq: Two times a day (BID) | ORAL | 0 refills | Status: AC
Start: 2016-07-03 — End: 2016-07-08

## 2016-07-03 MED ORDER — ALLOPURINOL 100 MG PO TABS
100.0000 mg | ORAL_TABLET | Freq: Every day | ORAL | 1 refills | Status: AC
Start: 2016-07-03 — End: 2016-10-01

## 2016-07-03 NOTE — Progress Notes (Signed)
Vitals:    06/26/16 1456   BP: (!) 155/97   Pulse: 98   Temp: 97.8 F (36.6 C)       Active Ambulatory Problems     Diagnosis Date Noted   . No Active Ambulatory Problems     Resolved Ambulatory Problems     Diagnosis Date Noted   . No Resolved Ambulatory Problems     Past Medical History:   Diagnosis Date   . Anxiety    . Arthritis    . Cellulitis    . Depression    . Diabetes mellitus    . Fatty liver    . Irritable bowel syndrome    . Obesity    . White coat hypertension         Reviewed vital signs, past medical history, social history, family history, medications and allergies with patient.    1. Diabetic peripheral neuropathy associated with type 2 diabetes mellitus     2. Foreign body in left foot, subsequent encounter          SUBJECTIVE: Kelli Carlson is a 50 year old female with a  history of diabetes and anxiety, who is seen for exam and  treatment of her lower extremity.  The patient last week had an  abscess which was drained over this lateral left foot and  foreign body removed from this side as well.  We ordered x-rays  at her last visit.  The patient did get them until two days  which indicated there was still a small fragment at this site,  but she has had no drainage from this area and today has little  discomfort.    OBJECTIVE FINDINGS: The patient with warm, dry, supple skin over  this lower extremity.  This previous site on the lateral left  foot is well healed at this point without any drainage.  The  patient with palpable pulses and mild edema, but without  ischemic changes or claudication.  The patient with protective  sensation decreased to this lower extremity with monofilament  and denies Tinel sign or Biagio Borg sign today.  The patient without  acute pain on palpation.  Muscle strength 5/5 in all four  quadrants.    ASSESSMENT:  1.  Diabetes with neuropathy.  2.  Foreign body with previous abscess, which is improved      and resolved at this point.    PLAN: At this point, we did re-examine  this site.  Discussed  that there is a foreign body fragment which likely still in  there, but this is completely healed.  We will refrain from  opening an incision at this time.  If this does become  problematic, we may have to consider removing this, but we would  have to do under C-arm guidance until it is very small and this  area is completely closed at this point.

## 2016-07-03 NOTE — Telephone Encounter (Signed)
I called the patient as per Dr Raquel Sarna with her lab results and let her know that Dr Raquel Sarna sent in medication for gout.  She has a FU this Wednesday with him. Sd

## 2016-07-03 NOTE — Telephone Encounter (Signed)
-----   Message from Nicolasa Ducking, [place] sent at 07/03/2016  3:52 PM EDT -----  Please let patient know that her uric acid was elevated indicating likely gout.  We will start her on colchicine to take care of these acute symptoms and then she can start allopurinol a couple of days later.    Thanks.     ----- Message -----  From: Interface, [place]: 07/03/2016   3:21 PM  To: Nicolasa Ducking, DPM

## 2016-07-03 NOTE — Progress Notes (Signed)
Vitals:    07/03/16 1331   BP: 170/86   Pulse: 92   Resp: 18   Temp: 98.6 F (37 C)       Active Ambulatory Problems     Diagnosis Date Noted   . No Active Ambulatory Problems     Resolved Ambulatory Problems     Diagnosis Date Noted   . No Resolved Ambulatory Problems     Past Medical History:   Diagnosis Date   . Anxiety    . Arthritis    . Cellulitis    . Depression    . Diabetes mellitus    . Fatty liver    . Irritable bowel syndrome    . Obesity    . White coat hypertension         Reviewed vital signs, past medical history, social history, family history, medications and allergies with patient.    1. 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 Right foot pain  CBC and differential    Uric acid    Sedimentation rate (ESR)    Rheumatoid factor   3. Diabetic peripheral neuropathy associated with type 2 diabetes mellitus         SUBJECTIVE: Kelli Carlson is a 50 year old female, who has a  history of diabetes and returns to clinic for reevaluation of  this lower extremity.  At the last visit, patient had treated  this left foot for foreign body as well as ulceration on the  distal toes.  This has largely resolved at this point.  Patient  relates new pain, which came on over the weekend on this right  foot in this mid arch region.  Patient noticed warmth and severe  pain at this site especially when walking.  Does not have any  open lesions or drainage from this site.  Denies any fever,  chills, nausea, or vomiting.  Patient does continue on  hydrochlorothiazide at this point.  Denies any history of gout  that she is aware of.    OBJECTIVE FINDINGS: Patient with warm, dry, and supple skin over  this lower extremity.  Patient with palpable pulses and  capillary refill time less than 3 seconds with mild edema.  Patient without any ischemic changes or claudication.  Ulceration site on the distal toes, where she  pulled the skin  are healing very superficial at this point.  Patient continued  to have this site healed on this lateral left foot, where she  previously had an abscess.  Patient does have decreased  protective sensation over this lower extremity with continued  numbness.  Patient with decreased sharp dull sensation as well.  Today, patient has pain on palpation at this medial arch with  this redness and warmth at this location as well.  Denies any  pain over this plantar fascia of Achilles tendon at this point.    ASSESSMENT:  1.  Gouty arthropathy, right foot.  2.  Diabetes with neuropathy.  3.  Right foot pain.    PLAN: At this point, we will order uric acid, sed rate,  rheumatoid factor, and CBC for evaluation.  Pending these  results we will likely start the patient on medication.  Advised  to call with worsening concerns or complaints.      The patient did have elevated uric acid today and we will start  patient on colchicine to deal with these acute symptoms and  start her on allopurinol following that tapering down this uric  acid.  We will have her return to clinic in three to four weeks  for re-evaluation.

## 2016-07-05 ENCOUNTER — Ambulatory Visit: Payer: PRIVATE HEALTH INSURANCE | Admitting: Primary Podiatric Medicine

## 2016-07-12 ENCOUNTER — Ambulatory Visit: Payer: PRIVATE HEALTH INSURANCE | Attending: Primary Podiatric Medicine | Admitting: Primary Podiatric Medicine

## 2016-07-12 ENCOUNTER — Encounter: Payer: Self-pay | Admitting: Primary Podiatric Medicine

## 2016-07-12 VITALS — BP 157/94 | HR 101 | Temp 97.8°F

## 2016-07-12 DIAGNOSIS — E1142 Type 2 diabetes mellitus with diabetic polyneuropathy: Secondary | ICD-10-CM

## 2016-07-12 DIAGNOSIS — M10071 Idiopathic gout, right ankle and foot: Secondary | ICD-10-CM | POA: Insufficient documentation

## 2016-07-17 ENCOUNTER — Emergency Department: Payer: Worker's Comp, Other unspecified

## 2016-07-17 ENCOUNTER — Emergency Department: Payer: Self-pay

## 2016-07-17 ENCOUNTER — Emergency Department
Admission: EM | Admit: 2016-07-17 | Discharge: 2016-07-17 | Disposition: A | Discharge status: Home / Self Care | Payer: Worker's Comp, Other unspecified | Attending: Personal Emergency Response Attendant | Admitting: Personal Emergency Response Attendant

## 2016-07-17 DIAGNOSIS — S8001XA Contusion of right knee, initial encounter: Secondary | ICD-10-CM | POA: Insufficient documentation

## 2016-07-17 DIAGNOSIS — W208XXA Other cause of strike by thrown, projected or falling object, initial encounter: Secondary | ICD-10-CM | POA: Insufficient documentation

## 2016-07-17 DIAGNOSIS — Y9389 Activity, other specified: Secondary | ICD-10-CM | POA: Insufficient documentation

## 2016-07-17 NOTE — ED Provider Notes (Signed)
[facility]  Emergency Department       Patient Name: Kelli Carlson, GORES A Patient DOB:  June 01, 1966   Encounter Date:  07/17/2016 Age: 50 y.o. female   Attending ED Physician: Flonnie Hailstone. Marion Downer, MD MRN:  [id number]   Room:  EX5/EX5-A PCP: Leta Baptist, MD      Diagnosis / Disposition:   Final Impression  1. Contusion of right knee, initial encounter        Disposition  ED Disposition     ED Disposition Condition Date/Time Comment    Discharge  Mon Jul 17, 2016  1:32 PM Sigurd Sos discharge to home/self care.    Condition at disposition: Stable          Follow up  Leta Baptist, MD  [address]  [place] [address]  [phone]    In 3 days  As needed, If symptoms worsen          If you have any worsening, new or concerning symptoms, please return to the emergency department for re-evaluation.       Prescriptions  New Prescriptions    No medications on file         History of Presenting Illness:   Chief complaint: Fall and Knee Injury    HPI/ROS is limited by: none  HPI/ROS given by: patient  Nursing (triage) note reviewed for the following pertinent information:  Tripped over a case of beans and fell onto her right knee      Nursing Notes reviewed and acknowledged.    Kelli Carlson is a 50 y.o. female who presents for evaluation of right knee injury. She was at work and she tripped over a can of beans and landed on her right knee. She was ambulatory at the scene. She has stinging pain in her knee and her anterior shin. She denies numbness. She denies hitting her head or LOC, any other trauma or injury.     In addition to the above history, please see nursing notes. Allergies, meds, past medical, family, social hx, and the results of the diagnostic studies performed have been reviewed by myself.      Review of Systems   Pertinent ROS as per HPI  Patient reports they have otherwise been in their usual state of health      Allergies / Medications:   Pt is allergic to vicodin  [hydrocodone-acetaminophen].    Current/Home Medications    ALLOPURINOL (ZYLOPRIM) 100 MG TABLET    Take 1 tablet (100 mg total) by mouth daily.    AMLODIPINE (NORVASC) 2.5 MG TABLET        BAYER CONTOUR NEXT TEST TEST STRIP        DULOXETINE (CYMBALTA) 30 MG CAPSULE    Take 30 mg by mouth daily.    DULOXETINE (CYMBALTA) 60 MG CAPSULE    Take 60 mg by mouth Once a day    GLIPIZIDE (GLUCOTROL) 5 MG TABLET    Take 2.5 mg by mouth 2 (two) times daily before meals.2.5 daily        IBUPROFEN (ADVIL,MOTRIN) 800 MG TABLET    Take 800 mg by mouth 3 (three) times daily as needed for Pain.    LOPERAMIDE (IMODIUM) 2 MG CAPSULE    Take 2 mg by mouth daily as needed for Diarrhea.    LORATADINE (CLARITIN) 10 MG TABLET    Take 10 mg by mouth daily.    LOSARTAN (COZAAR) 50 MG TABLET    Take  100 mg by mouth daily.        LOSARTAN-HYDROCHLOROTHIAZIDE (HYZAAR) 50-12.5 MG PER TABLET    Take 1 tablet by mouth daily.    SPIRONOLACTONE (ALDACTONE) 25 MG TABLET             Past History:   Medical: Pt has a past medical history of Anxiety; Arthritis; Cellulitis; Depression; Diabetes mellitus; Fatty liver; Irritable bowel syndrome; Obesity; and White coat hypertension.    Surgical: Pt  has a past surgical history that includes Cholecystectomy; Pelvic laparoscopy; and Tubal ligation.    Family: The family history is not on file.    Social: Pt reports that she has never smoked. She has never used smokeless tobacco. She reports that she does not drink alcohol or use drugs.      Physical Exam:   Constitutional: Vital signs reviewed. Well appearing.  Head: Normocephalic, atraumatic  Eyes: Conjunctiva and sclera are normal.  No injection or discharge.  Ears, Nose, Throat:  Normal external examination of the nose and ears.    Respiratory/Chest: No respiratory distress.   Extremities: RLE: Abrasion to the anterior knee. Able to fully flex/extend the knee. No laxity with varus/valgus stress on the knee, Negative anterior/posterior drawer testing.  FROM ankle/hip. Sensation to light touch intact. No deformity. 2+ DP/PT pulses. Cap refill <3 seconds.   Neurological: No focal motor deficits by observation. Speech normal.  Skin: Warm and dry. No rash.  Psychiatric: Alert and conversant.  Normal affect.  Normal insight.        MDM:   Patient presenting for evaluation of right knee injury. Neurovascularly intact. XR does not show any evidence of acute fracture or dislocation. Discussed with patient, will discharge home with PCP follow up, she is in agreement with this plan of care.       Course in ED:   1:30pm: Updated patient on results of imaging. Agrees with plan of care for discharge home.       Diagnostic Results:   The results of the diagnostic studies have been reviewed by myself:    Radiologic Studies  Xr Knee Right 4+ Views    Result Date: 07/17/2016  Negative examination right knee. ReadingStation:[id number]      Lab Studies  Labs Reviewed - No data to display      Procedure / EKG:   No procedures were performed during this encounter.            ATTESTATIONS   This chart was generated by an EMR and may contain errors or additions/omissions not intended by the user.         Flonnie Hailstone. Marion Downer, M.D.             Judee Clara, MD  07/17/16 [phone]

## 2016-07-17 NOTE — ED Triage Notes (Signed)
Pt tripped over a case of beans and injured her right knee.  Has pain in the right knee area as well as an abrasion.

## 2016-07-17 NOTE — Discharge Instructions (Signed)
Knee Sprain    A sprain is an injury to the ligaments or capsule that holds a joint together. There are no broken bones. Most sprains take 3 to 6 weeks to heal. If it a severe sprain where the ligament is completely torn, it can take months to recover.  Most knee sprains are treated with a splint, knee immobilizer brace, or elastic wrap for support. Severe sprains may require surgery.  Home care   Stay off the injured leg as much as possible until you can walk on it without pain. If you have a lot of pain with walking, crutches or a walker may be prescribed. (These can be rented or purchased at many pharmacies and surgical or orthopedic supply stores). Follow your healthcare provider's advice about when to begin putting weight on that leg.   Keep your leg elevated to reduce pain and swelling. When sleeping, place a pillow under the injured leg. When sitting, support the injured leg so it is level with your waist. This is very important during the first 48 hours.   Apply an ice pack over the injured area for 15 to 20 minutes every3 to 6hours. You should do this forthe first24 to 48 hours.You can make an ice pack by filling a plastic bag that seals at the top with ice cubes and then wrapping it with a thin towel. Continue to use ice packs for relief of pain and swelling as needed. As the ice melts, be careful to avoid getting your wrap, splint, or cast wet. After 48 hours, apply heat(warm shower orwarm bath)for 15 to 20 minutes several times a day, or alternate ice and heat.You can place the ice pack directly over the splint. If you have to wear a hook-and-loopknee brace, you can open it to apply the ice pack, or heat, directly to the knee. Never put ice directly on the skin. Always wrap the ice in a towel or other type of cloth.   You may useover-the-counter pain medicineto control pain, unless another pain medicine was prescribed.If you have chronic liver or kidney disease or ever had a stomach  ulcer or GI bleeding, talk with your healthcare provider beforeusing these medicines.   If you were given a splint, keep it completely dry at all times. Bathe with your splint out of the water, protected with2 large plastic bags, rubber-banded at the top end. If a fiberglass splint gets wet, you can dry it with a hair dryer. If you have ahook-and-loopknee brace, you can remove this to bathe, unless told otherwise.  Follow-up care  Follow up with your doctor as advised. Any X-rays you had today don't show any broken bones, breaks, or fractures. Sometimes fractures don't show up on the first X-ray. Bruises and sprains can sometimes hurt as much as a fracture. These injuries can take time to heal completely. If your symptoms don't improve or they get worse, talk with your doctor. You may need a repeat X-ray.If X-rays were taken, you will be told of any new findings that may affect your care.  Call 911  Call 911 if you have:   Shortness of breath   Chest pain  When to seek medical advice  Call your healthcare provider right awayif any of these occur:   Thesplintor knee immobilizerbracebecomes wet or soft   The fiberglass cast or splint remains wet for more than 24 hours   Pain or swelling increases   The injured legortoes become cold, blue, numb, or tingly  Date Last   Reviewed: 08/14/2014   2000-2016 [employer] [place], [place] [address]. All rights reserved. This information is not intended as a substitute for professional medical care. Always follow your healthcare professional's instructions.        Lower Extremity Contusion  You have a contusion (bruise) of a lower extremity (leg, knee, ankle, foot, or toe). Symptoms include pain, swelling, and skin discoloration. No bones are broken. This injury may take from a few days to a few weeks to heal. During that time, the bruise may change from reddish in color, to purple-blue, to green-yellow, to yellow-brown.  Home  care   Unless another medication was prescribed, you can take acetaminophen, ibuprofen, or naproxento control pain. (If you have chronic liver or kidney disease or ever had a stomach ulcer or GI bleeding, talk with your doctor before using these medicines.)   Elevate the injured area to reduce pain and swelling.As much as possible, sit or lie down with the injured area raised about the level of your heart. This is especially important during the first 48 hours.   Ice the injured area to help reduce pain and swelling.Wrap a cold source (ice packor ice cubes in a plastic bag) in a thin towel. Apply to the bruised area for 20 minutes every 1 to 2 hours the first day. Continue this 3 to 4 times a day until the pain and swelling goes away.   If crutches have been advised, do not bear full weight on the injured leg until you can do so without pain. You may return to sports when you are able to put full weight and impact on the injured leg without pain.  Follow up  Follow up with your healthcare provider or our staff as advised. Call if you are not improving within the next1 to 2 weeks.  When to seek medical advice  Call your healthcare provider right away if any of these occur:   Increased pain or swelling   Foot or toes becomecold, blue, numb or tingly   Signs of infection:Warmth, drainage, or increased redness or pain around the injury   Inability to move the injured area   Frequent bruising for unknown reasons  Date Last Reviewed: 01/16/2014   2000-2016 [employer], [place] [address]. All rights reserved. This information is not intended as a substitute for professional medical care. Always follow your healthcare professional's instructions.

## 2016-07-19 ENCOUNTER — Encounter: Payer: Self-pay | Admitting: Primary Podiatric Medicine

## 2016-07-19 NOTE — Progress Notes (Signed)
Vitals:    07/12/16 1545   BP: (!) 157/94   Pulse: (!) 101   Temp: 97.8 F (36.6 C)       Active Ambulatory Problems     Diagnosis Date Noted   . No Active Ambulatory Problems     Resolved Ambulatory Problems     Diagnosis Date Noted   . No Resolved Ambulatory Problems     Past Medical History:   Diagnosis Date   . Anxiety    . Arthritis    . Cellulitis    . Depression    . Diabetes mellitus    . Fatty liver    . Irritable bowel syndrome    . Obesity    . White coat hypertension         Reviewed vital signs, past medical history, social history, family history, medications and allergies with patient.    1. Acute idiopathic gout of right foot     2. Diabetic peripheral neuropathy associated with type 2 diabetes mellitus         SUBJECTIVE: Kelli Carlson is a 50 year old female with a  history of diabetes, who returns to clinic following diagnosis  of gouty arthritis at her last visit.  The patient had uric acid  which was elevated to 8.4 at that time.  The patient relates  that after taking the colchicine she had significant relief over  this right foot which previously had been significantly painful.  Denies any reaction to this medication.  The patient resting  comfortably today, in no apparent distress.    OBJECTIVE FINDINGS: The patient with warm, dry, supple skin over  this lower extremity.  The patient with palpable pulses and  capillary refill time less than 3 seconds with mild edema.  The  patient without ischemic changes or claudication today noted.  The patient with this ulceration site on the distal toes on this  left foot which is closed at this point and well healed.  The  patient does have decreased protective sensation over this lower  extremity with monofilament and numbness throughout.  The  patient without pain on palpation at this medial arch and this  redness and warmth have resolved as well.    ASSESSMENT:  1.  Gouty arthropathy, right foot.  2.  Diabetes with polyneuropathy.    PLAN: At this  point, we did start the patient on this  allopurinol and advised her to take this to help control this  uric acid level.  Advised to continue to monitor this lower  extremity closely and to call with concerns or complaints.

## 2016-09-24 ENCOUNTER — Other Ambulatory Visit: Payer: Self-pay

## 2016-10-11 ENCOUNTER — Ambulatory Visit: Payer: PRIVATE HEALTH INSURANCE | Admitting: Primary Podiatric Medicine

## 2016-11-07 ENCOUNTER — Encounter (INDEPENDENT_AMBULATORY_CARE_PROVIDER_SITE_OTHER): Payer: Self-pay

## 2016-11-07 ENCOUNTER — Ambulatory Visit (INDEPENDENT_AMBULATORY_CARE_PROVIDER_SITE_OTHER)
Admission: RE | Admit: 2016-11-07 | Discharge: 2016-11-07 | Disposition: A | Discharge status: Home / Self Care | Payer: PRIVATE HEALTH INSURANCE | Source: Ambulatory Visit | Attending: Family | Admitting: Family

## 2016-11-07 ENCOUNTER — Ambulatory Visit (INDEPENDENT_AMBULATORY_CARE_PROVIDER_SITE_OTHER): Payer: PRIVATE HEALTH INSURANCE | Admitting: Family

## 2016-11-07 VITALS — BP 170/95 | HR 100 | Temp 98.9°F | Resp 20 | Ht 68.4 in | Wt 275.0 lb

## 2016-11-07 DIAGNOSIS — R059 Cough, unspecified: Secondary | ICD-10-CM

## 2016-11-07 DIAGNOSIS — R05 Cough: Secondary | ICD-10-CM

## 2016-11-07 DIAGNOSIS — J209 Acute bronchitis, unspecified: Secondary | ICD-10-CM

## 2016-11-07 LAB — POCT INFLUENZA A/B
POCT Rapid Influenza A AG: NEGATIVE
POCT Rapid Influenza B AG: NEGATIVE

## 2016-11-07 MED ORDER — ALBUTEROL SULFATE (2.5 MG/3ML) 0.083% IN NEBU
2.5000 mg | INHALATION_SOLUTION | Freq: Once | RESPIRATORY_TRACT | Status: AC
Start: 2016-11-07 — End: 2016-11-07
  Administered 2016-11-07: 2.5 mg via RESPIRATORY_TRACT

## 2016-11-07 MED ORDER — ALBUTEROL SULFATE (2.5 MG/3ML) 0.083% IN NEBU
2.5000 mg | INHALATION_SOLUTION | Freq: Four times a day (QID) | RESPIRATORY_TRACT | 0 refills | Status: DC | PRN
Start: 2016-11-07 — End: 2019-06-17

## 2016-11-07 MED ORDER — ALBUTEROL SULFATE HFA 108 (90 BASE) MCG/ACT IN AERS
2.0000 | INHALATION_SPRAY | Freq: Four times a day (QID) | RESPIRATORY_TRACT | 0 refills | Status: DC | PRN
Start: 2016-11-07 — End: 2020-06-16

## 2016-11-07 MED ORDER — BENZONATATE 100 MG PO CAPS
100.0000 mg | ORAL_CAPSULE | Freq: Three times a day (TID) | ORAL | 0 refills | Status: DC | PRN
Start: 2016-11-07 — End: 2017-09-21

## 2016-11-07 MED ORDER — FLUTICASONE-SALMETEROL 250-50 MCG/DOSE IN AEPB
1.0000 | INHALATION_SPRAY | Freq: Two times a day (BID) | RESPIRATORY_TRACT | 0 refills | Status: DC
Start: 2016-11-07 — End: 2017-09-21

## 2016-11-07 MED ORDER — AZITHROMYCIN 250 MG PO TABS
250.0000 mg | ORAL_TABLET | Freq: Every day | ORAL | 0 refills | Status: DC
Start: 2016-11-07 — End: 2017-09-21

## 2016-11-07 NOTE — Patient Instructions (Signed)
What Is Acute Bronchitis?  Acute bronchitis is when the airways in your lungs (bronchial tubes) become red and swollen (inflamed). It is usually caused by a viral infection. But it can also occur because of a bacteria or allergen. Symptoms include a cough that produces yellow or greenish mucus and can last for days or sometimes weeks.  Inside healthy lungs    Air travels in and out of the lungs through the airways. The linings of these airways produce sticky mucus. This mucus traps particles that enter the lungs. Tiny structures called cilia then sweep the particles out of the airways.     Healthy airway: Airways are normally open. Air moves in and out easily.      Healthy cilia: Tiny, hairlike cilia sweep mucus and particles up and out of the airways.   Lungs with bronchitis  Bronchitis often occurs with a cold or the flu virus. The airways become inflamed (red and swollen).There isa deep hacking cough from the extra mucus. Other symptoms may include:   Wheezing   Chest discomfort   Shortness of breath   Mild fever  A second infection, this time due to bacteria, may then occur. And airways irritated by allergens or smoke are more likely to get infected.       Inflamed airway: Inflammation and extra mucus narrow the airway, causing shortness of breath.      Impaired cilia: Extra mucus impairs cilia, causing congestion and wheezing. Smoking makes the problem worse.   Making a diagnosis  A physical exam, health history, and certain tests help your healthcare provider make the diagnosis.  Healthhistory  Your healthcare providerwillask youabout your symptoms.  The exam  Your provider listens toyour chest for signs of congestion. He or she may also checkyour ears, nose, and throat.  Possible tests   A sputum testfor bacteria. This requires a sample of mucus from your lungs.   A nasal or throat swab. This tests to see if you have a bacterial infection.   A chest X-ray. This is doneif your healthcare  provider thinks you have pneumonia.   Tests to check for an underlying condition. Other tests may be done to check for things such as allergies, asthma, or COPD (chronic obstructive pulmonary disease). You may need to see a specialist for more lung function testing.  Treating a cough  The main treatment for bronchitis is easing symptoms. Avoiding smoke, allergens, and other things that trigger coughing can often help. If the infection is bacterial, you may be given antibiotics. During the illness, it's important to get plenty of sleep. To ease symptoms:   Don't smoke. Also avoid secondhand smoke.   Use a humidifier. Or try breathing in steam from a hot shower. This may help loosen mucus.   Drink a lot of water and juice. They can soothe the throat and may help thin mucus.   Sit up or use extra pillows when in bed. This helps to lessen coughing and congestion.   Ask your providerabout usingmedicine. Ask about using cough medicine, pain and fever medicine, or a decongestant.  Antibiotics  Most cases of bronchitis are caused by cold or flu viruses. They don't need antibiotics to treat them, even if your mucus is thick and green or yellow. Antibiotics don't treat viral illness and antibiotics have not been shown to have any benefit in cases of acute bronchitis. Taking antibiotics when they are not needed increases your risk of getting an infection later that is antibiotic-resistant. Antibiotics can   also cause severe cases of diarrhea that require other antibiotics to treat. It is important that you accept your healthcare provider's opinion to not use antibiotics.Your provider will prescribe antibiotics if the infection is caused by bacteria. If they are prescribed:   Take all of the medicine. Take the medicine until it is used up, even if symptoms have improved. If you don't, the bronchitis may come back.   Take the medicines as directed. For instance, some medicines should be taken with food.   Ask about  side effects. Ask your provider or pharmacist what side effects are common, and what to do about them.  Follow-up care  You shouldsee your provider again in 2 to 3 weeks. By this time, symptoms should have improved. An infection that lasts longer may mean you havea more serious problem.  Prevention   Avoid tobacco smoke. If you smoke, quit. Stay away from smoky places. Ask friends and family not to smoke around you, or in your home or car.   Get checked forallergies.   Ask your provider about getting a yearly flu shot. Also ask about pneumococcal or pneumonia shots.   Wash your hands often. This helps reduce the chance of picking up viruses that cause colds and flu.  Call your healthcare provider if:   Symptoms worsen, or you have new symptoms   Breathing problems worsenor become severe   Symptoms don't get better within a week, or within 3days of taking antibiotics   Date Last Reviewed: 10/27/2015   2000-2017 [employer] [address], [place] [address]. All rights reserved. This information is not intended as a substitute for professional medical care. Always follow your healthcare professional's instructions.

## 2016-11-07 NOTE — Progress Notes (Addendum)
Subjective:    Patient ID: Kelli Carlson is a 51 y.o. female.    Reports coughing and sneezing for a while, but yesterday it was much worse.  Denies history of asthma, COPD, pneumonia, and CV hx/CHF.  Denies smoking.      Fever    This is a new problem. The current episode started yesterday. The problem has been unchanged. The maximum temperature noted was 99 to 99.9 F. Associated symptoms include congestion, coughing, ear pain (tight), headaches and vomiting (x1 this morning). Pertinent negatives include no abdominal pain, chest pain, diarrhea, nausea, rash or sore throat. Treatments tried: decongestant. The treatment provided mild relief.   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    Review of Systems   Constitutional: Positive for fever (99.5). Negative for chills, diaphoresis and fatigue.   HENT: Positive for congestion and ear pain (tight). Negative for ear discharge, hearing loss, postnasal drip, rhinorrhea, sinus pressure and sore throat.    Eyes: Negative for pain and redness.   Respiratory: Positive for cough and chest tightness. Negative for shortness of breath.    Cardiovascular: Negative for chest pain.   Gastrointestinal: Positive for vomiting (x1 this morning). Negative for abdominal pain, diarrhea and nausea.   Genitourinary: Negative for difficulty urinating.   Musculoskeletal: Negative for myalgias.   Skin: Negative for rash.   Neurological: Positive for headaches.   All other systems reviewed and are negative.    Allergies   Allergen Reactions   . Vicodin [Hydrocodone-Acetaminophen] Itching       Past Medical History:   Diagnosis Date   . Anxiety    . Arthritis    . Cellulitis     left foot   . Depression    . Diabetes mellitus    . Fatty liver    .  Irritable bowel syndrome    . Obesity    . White coat hypertension        Past Surgical History:   Procedure Laterality Date   . CHOLECYSTECTOMY     . PELVIC LAPAROSCOPY     . TUBAL LIGATION           Objective:    BP (!) 170/95   Pulse 100   Temp 98.9 F (37.2 C) (Oral)   Resp 20   Ht 1.737 m (5' 8.4")   Wt 124.7 kg (275 lb)   SpO2 98%   BMI 41.33 kg/m     Physical Exam   Constitutional: She is oriented to person, place, and time. She appears well-developed and well-nourished.  Non-toxic appearance. She does not have a sickly appearance. She appears ill. No distress.   HENT:   Head: Normocephalic and atraumatic.   Right Ear: Hearing, tympanic membrane, external ear and ear canal normal. Tympanic membrane is not injected, not erythematous, not retracted and not bulging.   Left Ear: Hearing, tympanic membrane, external ear and ear canal normal. Tympanic membrane is not injected, not erythematous, not retracted and not bulging.   Nose: No mucosal edema or rhinorrhea.   Mouth/Throat: Oropharynx is clear and moist. No oropharyngeal exudate, posterior oropharyngeal edema, posterior oropharyngeal erythema or tonsillar abscesses.   Eyes: Conjunctivae are normal. Right eye exhibits no discharge. Left eye exhibits no discharge.   Cardiovascular: Normal  rate, regular rhythm and normal heart sounds.  Exam reveals no gallop and no friction rub.    No murmur heard.  Pulmonary/Chest: Effort normal. No tachypnea. No respiratory distress. She has decreased breath sounds (? d/t body habitus). She has no wheezes. She has no rhonchi. She has no rales.   Infrequent dry sounding cough.   Lymphadenopathy:        Head (right side): No submental, no submandibular, no tonsillar, no preauricular, no posterior auricular and no occipital adenopathy present.        Head (left side): No submental, no submandibular, no tonsillar, no preauricular, no posterior auricular and no occipital adenopathy present.     She has no cervical  adenopathy.        Right: No supraclavicular adenopathy present.        Left: No supraclavicular adenopathy present.   Neurological: She is alert and oriented to person, place, and time.   Skin: Skin is warm and dry. No rash noted. She is not diaphoretic.   Psychiatric: She has a normal mood and affect.   Nursing note and vitals reviewed.      Lab Results from today's visit:  Recent Results (from the past 12 hour(s))   POCT INFLUENZA A/B    Collection Time: 11/07/16  3:27 PM   Result Value Ref Range    POCT QC Pass     POCT Rapid Influenza A AG Negative Negative    POCT Rapid Influenza B AG Negative Negative       Radiology Results from today's visit:  Xr Chest 2 Views    Result Date: 11/07/2016  No evidence of acute airspace disease. ReadingStation:[id number]      Assessment and Plan:       [name] was seen today for emesis and fever.    Diagnoses and all orders for this visit:    Acute bronchitis, unspecified organism  -     fluticasone-salmeterol (ADVAIR DISKUS) 250-50 MCG/DOSE Aerosol Powder, Breath Activtivatede; Inhale 1 puff into the lungs 2 (two) times daily.  -     albuterol (PROVENTIL HFA;VENTOLIN HFA) 108 (90 Base) MCG/ACT inhaler; Inhale 2 puffs into the lungs every 6 (six) hours as needed for Wheezing or Shortness of Breath (cough).  -     albuterol (PROVENTIL) (2.5 MG/3ML) 0.083% nebulizer solution; Take 3 mLs (2.5 mg total) by nebulization every 6 (six) hours as needed for Wheezing or Shortness of Breath (cough).  -     azithromycin (ZITHROMAX) 250 MG tablet; Take 1 tablet (250 mg total) by mouth daily.Take 2 tablets on day 1, the take 1 tablet on day 2-5.  -     benzonatate (TESSALON PERLES) 100 MG capsule; Take 1 capsule (100 mg total) by mouth 3 (three) times daily as needed for Cough.    Cough  -     POCT INFLUENZA A/B  -     albuterol (PROVENTIL) nebulizer solution 2.5 mg; Take 3 mLs (2.5 mg total) by nebulization one time.      -     XR Chest 2 Views    Plan:  -POCT Flu A/B: negative.  -Patient  reports no change with albuterol neb.    Plan:  -Chest xray: negative.      Discussed that the symptoms are most likely due to viral illness and that antibiotics will not help.  -Neb and inhaler prescribed. Albuterol every 4-6 hours as needed for cough, shortness of breath, or wheezing.  -Defer oral steroids d/t T2DM.  Plan Advair BID x 30 days. Rinse your mouth. If this helps, f/u with PCP for refills.  -Tessalon for cough.  -Wait and see Antibiotics.  -RTC if not improved or start with fever for further evaluation.  -Take Tylenol or Ibuprofen every 4-6 hours as needed for pain or fever.  -Drink lots of water, monitor PO intake and urine output for hydration.  -Mucinex or Delsym for cough.  -Work note provided.    -BP was slightly elevated today, likely due to malaise/anxiety/OTC medications. Discussed that hypertension is the silent killer. You should check you BP when you go to the store, and keep a record to bring to your PCP. Patient educated to follow up to prevent long term CV problems.    Go to the ER for any new or worsening symptoms that concern you.  Follow-up with your primary care doctor or return to Urgent Care if your symptoms do not improve.    Patient and daughter agree with the plan.        Houston Siren, NP  [facility]  11/07/2016  4:32 PM

## 2016-11-07 NOTE — Progress Notes (Signed)
-   MAR ACTION REPORT  (last 24 hrs)         Demoni Gergen M, LPN       Medication Name Action Time Action Site Route Rate Dose Reason Comments User     albuterol (PROVENTIL) nebulizer solution 2.5 mg 11/07/16 1549 Given  Nebulization  2.5 mg   Aja Bolander M, LPN              albuterol Neb treatment   Start Time: 3:48pm by hmh  Stop Time: 3:56pm by Vesta Mixer Zoran Yankee  3:57 PM

## 2016-11-11 ENCOUNTER — Telehealth (INDEPENDENT_AMBULATORY_CARE_PROVIDER_SITE_OTHER): Payer: Self-pay

## 2016-11-11 NOTE — Telephone Encounter (Signed)
Courtesy call attempted. Left patient a voice mail with our number to call with any questions or concerns.  Zaneta Lightcap M Gerre Ranum  2:08 PM

## 2017-09-21 ENCOUNTER — Encounter (INDEPENDENT_AMBULATORY_CARE_PROVIDER_SITE_OTHER): Payer: Self-pay | Admitting: Family

## 2017-09-21 ENCOUNTER — Ambulatory Visit (INDEPENDENT_AMBULATORY_CARE_PROVIDER_SITE_OTHER): Payer: PRIVATE HEALTH INSURANCE | Admitting: Family

## 2017-09-21 VITALS — BP 140/88 | HR 91 | Temp 98.7°F | Resp 17 | Ht 67.0 in | Wt 296.6 lb

## 2017-09-21 DIAGNOSIS — I10 Essential (primary) hypertension: Secondary | ICD-10-CM

## 2017-09-21 DIAGNOSIS — R05 Cough: Secondary | ICD-10-CM

## 2017-09-21 DIAGNOSIS — J0100 Acute maxillary sinusitis, unspecified: Secondary | ICD-10-CM

## 2017-09-21 DIAGNOSIS — R030 Elevated blood-pressure reading, without diagnosis of hypertension: Secondary | ICD-10-CM

## 2017-09-21 MED ORDER — AMOXICILLIN-POT CLAVULANATE 875-125 MG PO TABS
1.0000 | ORAL_TABLET | Freq: Two times a day (BID) | ORAL | 0 refills | Status: DC
Start: 2017-09-21 — End: 2017-10-01

## 2017-09-21 NOTE — Progress Notes (Signed)
Subjective:    Patient ID: Kelli Carlson is a 51 y.o. female.    URI    This is a new problem. The current episode started 1 to 4 weeks ago (2 weeks). The problem has been gradually worsening. There has been no fever. Associated symptoms include congestion, coughing, ear pain, headaches, rhinorrhea and sinus pain. Pertinent negatives include no abdominal pain, chest pain, diarrhea, dysuria, joint pain, joint swelling, nausea, neck pain, plugged ear sensation, rash, sneezing, sore throat, swollen glands, vomiting or wheezing. She has tried acetaminophen, antihistamine, NSAIDs, sleep and steam for the symptoms. The treatment provided mild relief.       The following portions of the patient's history were reviewed and updated as appropriate: allergies, current medications, past family history, past medical history, past social history, past surgical history and problem list.    Review of Systems   Constitutional: Positive for fatigue.   HENT: Positive for congestion, ear pain, rhinorrhea and sinus pain. Negative for sneezing and sore throat.    Respiratory: Positive for cough. Negative for wheezing.    Cardiovascular: Negative for chest pain.   Gastrointestinal: Negative for abdominal pain, diarrhea, nausea and vomiting.   Genitourinary: Negative for dysuria.   Musculoskeletal: Negative for joint pain and neck pain.   Skin: Negative for rash.   Neurological: Positive for headaches.   All other systems reviewed and are negative.        Objective:    BP 140/88   Pulse 91   Temp 98.7 F (37.1 C) (Oral)   Resp 17   Ht 1.702 m (5\' 7" )   Wt 134.5 kg (296 lb 9.6 oz)   SpO2 100%   BMI 46.45 kg/m     Physical Exam   Constitutional: She is oriented to person, place, and time. Vital signs are normal. She appears well-developed and well-nourished.  Non-toxic appearance. She does not have a sickly appearance. She appears ill. No distress.   HENT:   Head: Normocephalic.   Right Ear: A middle ear effusion is present.    Left Ear: A middle ear effusion is present.   Nose: Mucosal edema and rhinorrhea present. Right sinus exhibits maxillary sinus tenderness. Left sinus exhibits maxillary sinus tenderness.   Mouth/Throat: Uvula is midline. Posterior oropharyngeal erythema present.   Cardiovascular: Regular rhythm.    No murmur heard.  Pulmonary/Chest: Effort normal and breath sounds normal.   Abdominal: Normal appearance.   Lymphadenopathy:     She has no cervical adenopathy.   Neurological: She is alert and oriented to person, place, and time. She is not disoriented.   Skin: Skin is warm. No rash noted. She is not diaphoretic.   Psychiatric: She has a normal mood and affect. Her speech is normal. Judgment normal. Cognition and memory are normal.         Assessment and Plan:       Kelli Carlson was seen today for uri.    Diagnoses and all orders for this visit:    Acute non-recurrent maxillary sinusitis  -     amoxicillin-clavulanate (AUGMENTIN) 875-125 MG per tablet; Take 1 tablet by mouth 2 (two) times daily.for 10 days    Elevated blood pressure reading in office with diagnosis of hypertension    Take antibiotic as instructed, always take exactly as instructed. Do not stop taking antibiotic when feeling better, finish completely (If any reaction to medication or intolerance call office before stopping)  May take decongestant as needed for symptoms (max 3 days)  Do not take antihistamines (this will thicken mucus)  Wash your hands often, do not touch eyes, nose or mouth unless washing hands after  Always cover mouth when coughing  Drink plenty of fluids  Use humidifier make sure you keep humidifier clean with soap and water to prevent spreading of germs.   Rinse nose with salt water/saline nasal spray  Use warm washcloth on face and nose to decrease pain  If at anytime you have fever/bleeding from nose, worsening headache after OTC pain relievers, change in vision or questions please call office    Discussed blood pressure, recently at  MD office on 12/11 with EKG and blood work, blood pressure was normal, states always elevated until end of visit  No chest pain or shortness of breath  Discussed no OTC medication at this time unless for HBP.        Carolin Sicks, NP  [facility]  09/21/2017  9:03 AM

## 2017-09-21 NOTE — Patient Instructions (Signed)
Acute Bacterial Rhinosinusitis (ABRS)    Acute bacterial rhinosinusitis (ABRS) is an infection of your nasal cavity and sinuses. It's caused by bacteria. Acute means that you've had symptoms for less than 4 weeks, but possibly up to 12 weeks.  Understanding your sinuses  The nasal cavity is the large air-filled space behind your nose. The sinuses are a group of spaces formed by the bones of your face. They connect with your nasal cavity. ABRS causes the tissue lining these spaces to become inflamed. Mucus may not drain normally. This leads to facial pain and other symptoms.  What causes ABRS?  ABRS most often follows an upper respiratory infection caused by a virus. Bacteria then infect the lining of your nasal cavity and sinuses. But you can also get ABRS if you have:   Nasal allergies   Long-term nasal swelling and congestion not caused by allergies   Blockage in the nose  Symptoms of ABRS  The symptoms of ABRS may be different for each person and include:   Nasal congestion or blockage   Pain or pressure in the face   Thick, colored drainage from the nose  Other symptoms may include:   Runny nose   Fluid draining from the nose down the throat (postnasal drip)   Headache   Cough   Pain   Fever  Diagnosing ABRS  ABRS may be diagnosed if you've had an upper respiratory infection like a cold and cough for 10 or more days without improvement or with worsening symptoms. Your healthcare provider will ask about your symptoms and your medical history. The provider will check your vital signs, including your temperature. You'll have a physical exam. The healthcare provider will check your ears, nose, and throat. You likely won't need any tests. If ABRS comes back, you may have a culture or other tests.  Treatment for ABRS  Treatment may include:   Antibiotic medicine. This is for symptoms that last for at least 10 to 14 days.   Nasal corticosteroid medicine. Drops or spray used in the nose can lessen  swelling and congestion.   Over-the-counter pain medicine. This is to lessen sinus pain and pressure.   Nasal decongestant medicine. Spray or drops may help to lessen congestion. Do not use them for more than a few days.   Salt wash (saline irrigation). This can help to loosen mucus.  Possible complications of ABRS  ABRS may come back or become long-term (chronic). In rare cases, ABRS may cause complications such as:   Inflamed tissue around the brain and spinal cord (meningitis)   Inflamed tissue around the eyes (orbital cellulitis)   Inflamed bones around the sinuses (osteitis)  These problems may need to be treated in a hospital with intravenous (IV) antibiotic medicine or surgery.  When to call the healthcare provider  Call your healthcare provider if you have any of the following:   Symptoms that don't get better, or get worse   Symptoms that don't get better after 3 to 5 days on antibiotics   Trouble seeing   Swelling around your eyes   Confusion or trouble staying awake   Date Last Reviewed: 01/24/2016   2000-2018 [employer] [address], [place] [address]. All rights reserved. This information is not intended as a substitute for professional medical care. Always follow your healthcare professional's instructions.

## 2017-09-23 ENCOUNTER — Telehealth (INDEPENDENT_AMBULATORY_CARE_PROVIDER_SITE_OTHER): Payer: Self-pay

## 2017-09-23 NOTE — Telephone Encounter (Signed)
Follow up call made. Left message with patient to give us a call back with any questions or concerns

## 2017-09-26 ENCOUNTER — Ambulatory Visit (INDEPENDENT_AMBULATORY_CARE_PROVIDER_SITE_OTHER): Payer: PRIVATE HEALTH INSURANCE | Admitting: Family

## 2017-09-26 ENCOUNTER — Encounter (INDEPENDENT_AMBULATORY_CARE_PROVIDER_SITE_OTHER): Payer: Self-pay | Admitting: Family

## 2017-09-26 VITALS — BP 162/95 | HR 85 | Temp 97.9°F | Resp 20 | Ht 67.0 in | Wt 296.0 lb

## 2017-09-26 DIAGNOSIS — H6983 Other specified disorders of Eustachian tube, bilateral: Secondary | ICD-10-CM

## 2017-09-26 DIAGNOSIS — R059 Cough, unspecified: Secondary | ICD-10-CM

## 2017-09-26 DIAGNOSIS — J0100 Acute maxillary sinusitis, unspecified: Secondary | ICD-10-CM

## 2017-09-26 DIAGNOSIS — R0981 Nasal congestion: Secondary | ICD-10-CM

## 2017-09-26 DIAGNOSIS — J0140 Acute pansinusitis, unspecified: Secondary | ICD-10-CM

## 2017-09-26 DIAGNOSIS — R030 Elevated blood-pressure reading, without diagnosis of hypertension: Secondary | ICD-10-CM

## 2017-09-26 DIAGNOSIS — R05 Cough: Secondary | ICD-10-CM

## 2017-09-26 DIAGNOSIS — Z5189 Encounter for other specified aftercare: Secondary | ICD-10-CM

## 2017-09-26 MED ORDER — FLUTICASONE PROPIONATE 50 MCG/ACT NA SUSP
1.0000 | Freq: Every day | NASAL | 2 refills | Status: AC
Start: 2017-09-26 — End: 2017-10-26

## 2017-09-26 NOTE — Progress Notes (Signed)
Subjective:    Patient ID: Kelli Carlson is a 52 y.o. female.    Patient was seen 5 days ago and given antibiotics for sinus, states she still has cough and doesn't feel better      URI    This is a recurrent problem. The current episode started in the past 7 days. The problem has been unchanged. There has been no fever. Associated symptoms include congestion, coughing, ear pain and sinus pain. Pertinent negatives include no abdominal pain, chest pain, diarrhea, dysuria, headaches, joint pain, joint swelling, nausea, neck pain, plugged ear sensation, rash, rhinorrhea, sneezing, sore throat, swollen glands, vomiting or wheezing. Treatments tried: antibitocs. The treatment provided mild relief.       The following portions of the patient's history were reviewed and updated as appropriate: allergies, current medications, past family history, past medical history, past social history, past surgical history and problem list.    Review of Systems   Constitutional: Negative.    HENT: Positive for congestion, ear pain and sinus pain. Negative for rhinorrhea, sneezing and sore throat.    Respiratory: Positive for cough. Negative for wheezing.    Cardiovascular: Negative for chest pain.   Gastrointestinal: Negative for abdominal pain, diarrhea, nausea and vomiting.   Genitourinary: Negative for dysuria.   Musculoskeletal: Negative for joint pain and neck pain.   Skin: Negative for rash.   Neurological: Negative for headaches.         Objective:    BP (!) 187/99 Comment: right arm  Pulse 85   Temp 97.9 F (36.6 C) (Oral)   Resp 20   Ht 1.702 m (5\' 7" )   Wt 134.3 kg (296 lb)   SpO2 100%   BMI 46.36 kg/m     Physical Exam   Constitutional: She is oriented to person, place, and time. Vital signs are normal. She appears well-developed and well-nourished. No distress.   HENT:   Head: Normocephalic and atraumatic.   Right Ear: Hearing, external ear and ear canal normal. Tympanic membrane is bulging.   Left Ear: Hearing,  external ear and ear canal normal. Tympanic membrane is bulging.   Nose: Right sinus exhibits maxillary sinus tenderness and frontal sinus tenderness. Left sinus exhibits maxillary sinus tenderness and frontal sinus tenderness.   Mouth/Throat: Uvula is midline, oropharynx is clear and moist and mucous membranes are normal. No oropharyngeal exudate.   Eyes: Pupils are equal, round, and reactive to light.   Neck: Neck supple.   Cardiovascular: Normal rate, regular rhythm and normal heart sounds.    No murmur heard.  Pulmonary/Chest: Effort normal and breath sounds normal. No respiratory distress.   Musculoskeletal: Normal range of motion.   Neurological: She is alert and oriented to person, place, and time.   Skin: Skin is warm and dry. Capillary refill takes less than 2 seconds. She is not diaphoretic.   Nursing note and vitals reviewed.     Labs  No results found for this or any previous visit (from the past 24 hour(s)).    Radiology  No results found.        Assessment and Plan:       [name] was seen today for sinus problem, cough and otalgia.    Diagnoses and all orders for this visit:    Single episode of elevated blood pressure    Acute pansinusitis, recurrence not specified  -     fluticasone (FLONASE) 50 MCG/ACT nasal spray; 1 spray by Nasal route daily.  Cough    Sinus congestion  -     fluticasone (FLONASE) 50 MCG/ACT nasal spray; 1 spray by Nasal route daily.    Dysfunction of both eustachian tubes  -     fluticasone (FLONASE) 50 MCG/ACT nasal spray; 1 spray by Nasal route daily.    Take over the counter medication for any cough  Finish your antibiotics    Take all of your medication  Patient is aware of elevated blood pressure, will follow up with PCP  Increase fluid intake  Antipyretics for any pain or fever  -Follow up with PCP if symptoms lasting longer than expected  -RTC or ER if new/worsening symptoms  -Pt/guardian agreed to [place], NP  [facility]  09/26/2017  10:21  AM

## 2017-09-26 NOTE — Patient Instructions (Addendum)
Take all of your medication as prescribed 5 days ago  Take over the counter medication for any cough  Increase fluid intake  Antipyretics for any pain or fever  -Follow up with PCP if symptoms lasting longer than expected  -RTC or ER if new/worsening symptoms  -Pt/guardian agreed to plan    Sinusitis (Antibiotic Treatment)    The sinuses are air-filled spaces within the bones of the face. They connect to the inside of the nose.Sinusitisis an inflammation of the tissue lining the sinus cavity. Sinus inflammation can occur during a cold. It can also be due to allergies to pollens and other particles in the air. Sinusitis can cause symptoms of sinus congestion and fullness. A sinus infection causes fever, headache and facial pain. There is often green or yellow drainage from the nose or into the back of the throat (post-nasal drip). You have been given antibiotics to treat this condition.  Home care:   Take the full course of antibiotics as instructed. Do not stop taking them, even if you feel better.   Drink plenty of water, hot tea, and other liquids. This may help thin mucus. It also may promote sinus drainage.   Heat may help soothe painful areas of the face. Use a towel soaked in hot water. Or, stand in the shower and direct the hot spray onto your face. Using a vaporizer along with a menthol rub at night may also help.   Anexpectorantcontaining guaifenesin may help thin the mucus and promote drainage from the sinuses.   Over-the-counterdecongestantsmay be used unless a similar medicine was prescribed. Nasal sprays work the fastest. Use one that contains phenylephrine or oxymetazoline. First blow the nose gently. Then use the spray. Do not use these medicines more often than directed on the label or symptoms may get worse. You may also use tablets containing pseudoephedrine. Avoid products that combine ingredients, because side effects may be increased. Read labels. You can also ask the pharmacist for  help. (NOTE:Persons with high blood pressure should not use decongestants. They can raise blood pressure.)   Over-the-counterantihistaminesmay help if allergies contributed to your sinusitis.    Do not use nasal rinses or irrigation during an acute sinus infection, unless told to by your health care provider. Rinsing may spread the infection to other sinuses.   Use acetaminophen or ibuprofen to control pain, unless another pain medicine was prescribed. (If you have chronic liver or kidney disease or ever had a stomach ulcer, talk with your doctor before using these medicines. Aspirin should never be used in anyone under 24 years of age who is ill with a fever. It may cause severe liver damage.)   Don't smoke. This can worsen symptoms.  Follow-up care  Follow up with your healthcare provider or our staff if you are not improving within the next week.  When to seek medical advice  Call your healthcare provider if any of these occur:   Facial pain or headache becoming more severe   Stiff neck   Unusual drowsiness or confusion   Swelling of the forehead or eyelids   Vision problems, including blurred or double vision   Fever of100.20F (38C)or higher, or as directed by your healthcare provider   Seizure   Breathing problems   Symptoms not resolving within 10 days  Date Last Reviewed: 01/05/2014   2000-2016 [employer] [place], [place] [address]. All rights reserved. This information is not intended as a substitute for professional medical care.  Always follow your healthcare professional's instructions.

## 2017-09-29 ENCOUNTER — Telehealth (INDEPENDENT_AMBULATORY_CARE_PROVIDER_SITE_OTHER): Payer: Self-pay

## 2017-09-29 NOTE — Telephone Encounter (Signed)
Follow up call made to patient. Spoke with patient. Feeling BETTER. Let them know to call us back with any questions or concerns.

## 2017-10-01 ENCOUNTER — Other Ambulatory Visit
Admission: RE | Admit: 2017-10-01 | Discharge: 2017-10-01 | Disposition: A | Discharge status: Home / Self Care | Payer: PRIVATE HEALTH INSURANCE | Source: Ambulatory Visit | Attending: Family | Admitting: Family

## 2017-10-01 ENCOUNTER — Encounter (INDEPENDENT_AMBULATORY_CARE_PROVIDER_SITE_OTHER): Payer: Self-pay | Admitting: Family

## 2017-10-01 ENCOUNTER — Ambulatory Visit (INDEPENDENT_AMBULATORY_CARE_PROVIDER_SITE_OTHER): Payer: PRIVATE HEALTH INSURANCE | Admitting: Family

## 2017-10-01 VITALS — BP 180/90 | HR 89 | Temp 98.1°F | Resp 16 | Ht 67.0 in | Wt 294.0 lb

## 2017-10-01 DIAGNOSIS — R350 Frequency of micturition: Secondary | ICD-10-CM

## 2017-10-01 LAB — VH POCT UA-AUTOMATED(UCC)
Bilirubin, UA POCT: NEGATIVE
Glucose, UA POCT: NEGATIVE
Ketones, UA POCT: NEGATIVE mg/dL
Nitrite, UA POCT: NEGATIVE
PH, UA POCT: 6 (ref 4.6–8)
Protein, UA POCT: 300 mg/dL — AB
Specific Gravity, UA POCT: 1.025 mg/dL (ref 1.001–1.035)
Urine Leukocytes POCT: NEGATIVE
Urobilinogen, UA POCT: 0.2 mg/dL

## 2017-10-01 MED ORDER — SULFAMETHOXAZOLE-TRIMETHOPRIM 800-160 MG PO TABS
1.0000 | ORAL_TABLET | Freq: Two times a day (BID) | ORAL | 0 refills | Status: DC
Start: 2017-10-01 — End: 2017-10-01

## 2017-10-01 MED ORDER — SULFAMETHOXAZOLE-TRIMETHOPRIM 800-160 MG PO TABS
1.0000 | ORAL_TABLET | Freq: Two times a day (BID) | ORAL | 0 refills | Status: AC
Start: 2017-10-01 — End: 2017-10-06

## 2017-10-01 NOTE — Progress Notes (Signed)
Subjective:    Patient ID: Kelli Carlson is a 52 y.o. female who presents with urinary frequency and R sided flank pain. Pt denies hx of Carlson stones or previous uti. Pt states symptoms started Friday night.    Flank Pain   This is a new problem. The current episode started in the past 7 days. The problem occurs constantly. The problem has been gradually worsening since onset. The quality of the pain is described as aching and cramping. The pain does not radiate. The pain is at a severity of 10/10. The pain is severe. The pain is the same all the time. The symptoms are aggravated by standing and position. Pertinent negatives include no abdominal pain, bladder incontinence, bowel incontinence, chest pain, dysuria, fever, headaches, numbness, pelvic pain or weakness. She has tried nothing for the symptoms. The treatment provided no relief.       The following portions of the patient's history were reviewed and updated as appropriate: allergies, current medications, past family history, past medical history, past social history, past surgical history and problem list.    Review of Systems   Constitutional: Negative for activity change, appetite change, chills, diaphoresis, fatigue, fever and unexpected weight change.   Cardiovascular: Negative for chest pain.   Gastrointestinal: Negative for abdominal pain and bowel incontinence.   Genitourinary: Positive for flank pain, frequency and hematuria. Negative for bladder incontinence, decreased urine volume, difficulty urinating, dysuria, enuresis, genital sores, menstrual problem, pelvic pain, urgency, vaginal bleeding, vaginal discharge and vaginal pain.   Neurological: Negative for weakness, numbness and headaches.   All other systems reviewed and are negative.        Objective:    BP 180/90   Pulse 89   Temp 98.1 F (36.7 C) (Oral)   Resp 16   Ht 1.702 m (5\' 7" )   Wt 133.4 kg (294 lb)   BMI 46.05 kg/m     Physical Exam   Constitutional: She is oriented to  person, place, and time. She appears well-developed and well-nourished.   Neck: Normal range of motion.   Cardiovascular: Normal rate, regular rhythm and normal heart sounds.    Pulmonary/Chest: Effort normal and breath sounds normal.   Abdominal: Soft. She exhibits no distension and no mass. There is no tenderness. There is CVA tenderness. There is no rigidity, no rebound and no guarding.   Musculoskeletal: Normal range of motion.   Neurological: She is alert and oriented to person, place, and time.   Skin: Skin is warm and dry.   Psychiatric: She has a normal mood and affect. Her behavior is normal. Thought content normal.         Assessment and Plan:       Kelli Carlson was seen today for flank pain.    Diagnoses and all orders for this visit:    Urinary frequency  -     VH POCT UA AUTO  -     Urine Culture; Future  -     Urine Culture    Other orders  -     sulfamethoxazole-trimethoprim (BACTRIM DS) 800-160 MG per tablet; Take 1 tablet by mouth 2 (two) times daily.for 10 days      -Discussed UA results:  -Discussed antibiotic treatment with Bactrium for 5 days  -will change antibiotic if needed based on C&S  -Discussed RTC or report to ER if new/worsening symptoms such as: intractable fever, nausea, vomiting, severe flank pain, sweats  -Follow up with PCP if symptoms lasting longer  than expected  -Patient agreed to plan  If urinary symptoms not improving in 48 hours please call back or return to clinic for re-evaluation.  If you have any signs of worsening infection return immediately for re-check, especially any upper back pain, fever, nausea or vomiting.        Kelli Carlson, [facility]  10/01/2017  9:08 AM

## 2017-10-01 NOTE — Patient Instructions (Signed)
Urinary Tract Infections in Women    Urinary tract infections (UTIs) are most often caused bybacteria. These bacteria enter the urinary tract. The bacteria may come from outside the body. Or they may travel from the skin outside therectum or vagina into the urethra. Female anatomy makes it easy for bacteria from the bowelto enter a woman's urinary tract, which is the most common source of UTI. This means women develop UTIs more often than men. Pain in or around the urinary tract is a common UTI symptom. But the only way to know for sure if you have a UTI for the healthcare provider to test your urine. The two tests that may be done are the urinalysis and urine culture.  Types of UTIs   Cystitis. A bladder infection (cystitis) is the most common UTI in women. You may have urgent or frequent urination. You may also havepain, burning when you urinate, and bloody urine.   Urethritis. This is an inflamed urethra, which is the tube that carries urine from the bladder to outside the body. You may have lower stomach or back pain. You may also have urgent or frequent urination.   Pyelonephritis. This is a kidney infection. If not treated, it can be serious and damage your kidneys. In severe cases, you may need to stay in thehospital. You may have a fever and lower back pain.  Medicines to treat a UTI  Most UTIs are treated with antibiotics. These kill the bacteria. The length of time you need to take them depends on the type of infection. It may be as short as 3 days. If you have repeated UTIs, you may need a low-dose antibioticfor several months. Take antibiotics exactly as directed. Don't stop taking them until all of the medicine is gone. If you stop taking the antibiotic too soon, the infection may not go away. You may also develop a resistance to the antibiotic. This can make it much harder to treat.  Lifestyle changes to treat and prevent UTIs  The lifestyle changes below will help get rid of your UTI. They  may also help prevent future UTIs.   Drink plenty of fluids. This includes water, juice, or other caffeine-free drinks. Fluids help flush bacteria out of your body.   Empty your bladder. Always empty your bladder when you feel the urge to urinate. And always urinate before going to sleep. Urine that stays in your bladder can lead to infection. Try to urinate before and after sex as well.   Practice good personal hygiene. Wipe yourself from front to back after using the toilet. This helps keep bacteria from getting into the urethra.   Use condoms during sex. These help prevent UTIs caused by sexually transmitted bacteria. Also don't use spermicides during sex. These can increase the risk for UTIs. Choose other forms of birth control instead. For women who tend to get UTIs after sex, a low-dose of a preventive antibiotic may be used. Be sure to discuss this option with your healthcare provider.   Follow up with your healthcare provider as directed. He or she may test to make sure the infection has cleared. If needed, more treatment may be started.  Date Last Reviewed: 09/26/2015   2000-2018 [employer] [address], [place] [address]. All rights reserved. This information is not intended as a substitute for professional medical care. Always follow your healthcare professional's instructions.

## 2017-10-01 NOTE — Progress Notes (Signed)
Urine Culture collected from patient by AE. This staff member prepared specimen, applied required charges and placed in courier box for lab courier to pick up.  Kelli Carlson  8:44 AM

## 2017-10-01 NOTE — Addendum Note (Signed)
Addended by: Sharla Kidney on: 10/01/2017 10:13 AM     Modules accepted: Orders

## 2017-10-03 ENCOUNTER — Telehealth (INDEPENDENT_AMBULATORY_CARE_PROVIDER_SITE_OTHER): Payer: Self-pay

## 2017-10-03 NOTE — Telephone Encounter (Signed)
Left voicemail for patient to return call regarding lab results.   Kelli Carlson M Bastian Andreoli  5:26 PM

## 2017-10-04 ENCOUNTER — Telehealth (INDEPENDENT_AMBULATORY_CARE_PROVIDER_SITE_OTHER): Payer: Self-pay

## 2017-10-04 NOTE — Telephone Encounter (Signed)
Left voicemail for patient to return call regarding lab results.   Kelli Carlson [name]  9:31 AM

## 2017-10-04 NOTE — Telephone Encounter (Signed)
Patient confirmed full name and DOB. Advised of lab results. No further questions or concerns at this time.   Kelli Carlson M Kelli Carlson  10:30 AM

## 2017-10-08 ENCOUNTER — Emergency Department (HOSPITAL_BASED_OUTPATIENT_CLINIC_OR_DEPARTMENT_OTHER)
Admission: EM | Admit: 2017-10-08 | Discharge: 2017-10-08 | Disposition: A | Discharge status: Home / Self Care | Payer: No Typology Code available for payment source | Attending: Emergency Medicine | Admitting: Emergency Medicine

## 2017-10-08 ENCOUNTER — Emergency Department (HOSPITAL_BASED_OUTPATIENT_CLINIC_OR_DEPARTMENT_OTHER): Payer: No Typology Code available for payment source

## 2017-10-08 ENCOUNTER — Encounter (HOSPITAL_BASED_OUTPATIENT_CLINIC_OR_DEPARTMENT_OTHER): Payer: Self-pay

## 2017-10-08 DIAGNOSIS — Z79899 Other long term (current) drug therapy: Secondary | ICD-10-CM | POA: Insufficient documentation

## 2017-10-08 DIAGNOSIS — K5901 Slow transit constipation: Secondary | ICD-10-CM | POA: Diagnosis present

## 2017-10-08 DIAGNOSIS — K5900 Constipation, unspecified: Secondary | ICD-10-CM | POA: Insufficient documentation

## 2017-10-08 DIAGNOSIS — Z7951 Long term (current) use of inhaled steroids: Secondary | ICD-10-CM | POA: Insufficient documentation

## 2017-10-08 DIAGNOSIS — N2889 Other specified disorders of kidney and ureter: Secondary | ICD-10-CM | POA: Insufficient documentation

## 2017-10-08 DIAGNOSIS — I10 Essential (primary) hypertension: Secondary | ICD-10-CM | POA: Insufficient documentation

## 2017-10-08 DIAGNOSIS — E875 Hyperkalemia: Secondary | ICD-10-CM | POA: Insufficient documentation

## 2017-10-08 DIAGNOSIS — Z885 Allergy status to narcotic agent status: Secondary | ICD-10-CM | POA: Insufficient documentation

## 2017-10-08 DIAGNOSIS — Z7984 Long term (current) use of oral hypoglycemic drugs: Secondary | ICD-10-CM | POA: Insufficient documentation

## 2017-10-08 DIAGNOSIS — Z9851 Tubal ligation status: Secondary | ICD-10-CM | POA: Insufficient documentation

## 2017-10-08 DIAGNOSIS — N179 Acute kidney failure, unspecified: Secondary | ICD-10-CM | POA: Insufficient documentation

## 2017-10-08 DIAGNOSIS — Z9049 Acquired absence of other specified parts of digestive tract: Secondary | ICD-10-CM | POA: Insufficient documentation

## 2017-10-08 LAB — URINALYSIS, MACROSCOPIC
KETONES: NEGATIVE mg/dL
LEUKOCYTES: NEGATIVE WBCs/uL
NITRITE: NEGATIVE
PH: 6 (ref <8.0)
PROTEIN: 100 mg/dL — AB
SPECIFIC GRAVITY: 1.02 (ref <1.022)
UROBILINOGEN: 0.2 mg/dL (ref <=2.0)

## 2017-10-08 LAB — CBC WITH DIFF
BASOPHIL #: 0 x10ˆ3/uL (ref 0.00–0.10)
BASOPHIL %: 1 % (ref 0–3)
EOSINOPHIL #: 0.1 x10ˆ3/uL (ref 0.00–0.50)
EOSINOPHIL %: 2 % (ref 0–5)
HCT: 39 % (ref 36.0–45.0)
HGB: 13.2 g/dL (ref 12.0–15.5)
LYMPHOCYTE #: 1.2 x10ˆ3/uL (ref 1.00–4.80)
LYMPHOCYTE %: 25 % (ref 15–43)
MCH: 30.2 pg (ref 27.5–33.2)
MCHC: 33.9 g/dL (ref 32.0–36.0)
MCV: 89 fL (ref 82.0–97.0)
MONOCYTE #: 0.3 x10ˆ3/uL (ref 0.20–0.90)
MONOCYTE %: 7 % (ref 5–12)
MPV: 9.5 fL (ref 7.4–10.5)
NEUTROPHIL #: 3.1 x10ˆ3/uL (ref 1.50–6.50)
NEUTROPHIL %: 66 % (ref 43–76)
PLATELETS: 236 x10ˆ3/uL (ref 150–450)
RBC: 4.38 x10ˆ6/uL (ref 4.00–5.10)
RDW: 14.2 % (ref 11.0–16.0)
WBC: 4.7 x10ˆ3/uL (ref 4.0–11.0)

## 2017-10-08 LAB — BASIC METABOLIC PANEL
ANION GAP: 10 mmol/L (ref 3–11)
BUN/CREA RATIO: 15 (ref 6–22)
BUN: 29 mg/dL — ABNORMAL HIGH (ref 6–20)
CALCIUM: 9.4 mg/dL (ref 8.6–10.3)
CHLORIDE: 104 mmol/L (ref 101–111)
CO2 TOTAL: 21 mmol/L — ABNORMAL LOW (ref 22–32)
CREATININE: 1.92 mg/dL — ABNORMAL HIGH (ref 0.44–1.00)
ESTIMATED GFR: 28 mL/min/1.73mˆ2 — ABNORMAL LOW (ref >60)
GLUCOSE: 123 mg/dL — ABNORMAL HIGH (ref 70–110)
POTASSIUM: 5.4 mmol/L — ABNORMAL HIGH (ref 3.4–5.1)
SODIUM: 135 mmol/L — ABNORMAL LOW (ref 136–145)

## 2017-10-08 LAB — URINALYSIS, MICROSCOPIC

## 2017-10-08 MED ORDER — KETOROLAC 30 MG/ML (1 ML) INJECTION SOLUTION
30.00 mg | INTRAMUSCULAR | Status: AC
Start: 2017-10-08 — End: 2017-10-08
  Administered 2017-10-08: 30 mg via INTRAVENOUS
  Filled 2017-10-08: qty 1

## 2017-10-08 MED ORDER — OXYCODONE 5 MG TABLET: 5 mg | Tab | Freq: Four times a day (QID) | ORAL | 0 refills | 0 days | Status: DC | PRN

## 2017-10-08 MED ORDER — SODIUM CHLORIDE 0.9 % IV BOLUS
1000.0000 mL | INJECTION | Freq: Once | Status: DC
Start: 2017-10-08 — End: 2017-10-08
  Administered 2017-10-08: 1000 mL via INTRAVENOUS
  Administered 2017-10-08: 0 mL via INTRAVENOUS

## 2017-10-08 MED ADMIN — sodium chloride 0.9 % intravenous solution: INTRAVENOUS | @ 16:00:00

## 2017-10-08 MED ADMIN — bacitracin 500 unit/gram topical ointment: INTRAVENOUS | @ 14:00:00 | NDC 00168001135

## 2017-10-08 NOTE — ED Nurses Note (Signed)
Blood & urine to lab via tube system.

## 2017-10-08 NOTE — ED Provider Notes (Signed)
Deuell, Zella Richer, DO  Resident: Everardo Pacific, DO  Emergency Department Visit Note    Date:  10/08/2017  Primary care provider:  Candee Furbish, MD  Means of arrival:  private car  History obtained from: patient, parent and past medical records  History limited by: none    Chief Complaint:  Right flank pain     HISTORY OF PRESENT ILLNESS     [name], date of birth 09/22/1966, is a 52 y.o. female who presents to the Emergency Department complaining of  Right flank pain that has been persisting for 2-3 wks. She has a PMH significant for well controlled DM II, HTN. She reports recent UTI and completion of Bactrim, 5 day course, few days ago. She denies dysuria, hematuria, fevers, shakes, chills, vomiting, night sweats. Admits to minor nausea. She states the pain radiates down into her hip. Sharp and stabbing quality with exacerbation with movement and certain positions. Can not sleep in fetal position which is typically how she sleeps.  She denies any hx of kidney stones or family hx of renal disease, LE edema.      REVIEW OF SYSTEMS     The pertinent positive and negative symptoms are as per HPI. All other systems reviewed and are negative.     PATIENT HISTORY     Past Medical History:  Past Medical History:   Diagnosis Date   . Anxiety    . Arthritis    . Cellulitis 10/24/14    left foot   . Depression    . Diabetes mellitus (CMS [place])    . Fatty liver    . Headache(784.0)    . Irritable bowel syndrome    . Macular edema    . Obesity    . White coat hypertension        Past Surgical History:  Past Surgical History:   Procedure Laterality Date   . Hx cholecystectomy  09/10/96   . Hx other  1996   . Hx pelvic laparoscopy  05/24/98   . Hx tubal ligation  09/10/2012       Social History:  Social History     Tobacco Use   . Smoking status: Never Smoker   . Smokeless tobacco: Never Used   Substance Use Topics   . Alcohol use: No   . Drug use: No     Social History     Substance and Sexual Activity   Drug Use No              Medications:  Outpatient Medications Marked as Taking for the 10/08/17 encounter [facility] Encounter)   Medication Sig   . albuterol sulfate (PROVENTIL OR VENTOLIN OR PROAIR) 90 mcg/actuation Inhalation HFA Aerosol Inhaler Take 1-2 Puffs by inhalation Every 6 hours as needed   . amLODIPine (NORVASC) 2.5 mg Oral Tablet Take 2.5 mg by mouth Once a day   . fluticasone (FLONASE) 50 mcg/actuation Nasal Spray, Suspension 1 Spray by Each Nostril route Once a day   . glipiZIDE (GLUCOTROL) 5 mg Oral Tablet Take 2.5 mg by mouth Twice a day before meals Take 30 minutes before meals   . loperamide (IMODIUM) 2 mg Oral Capsule Take 2 mg by mouth Every 4 hours as needed   . Losartan-Hydrochlorothiazide 50-12.5 mg Oral Tablet Take 1 Tab by mouth Once a day   . magnesium oxide (MAG-OX) 400 mg (241.3 mg magnesium) Oral Tablet Take 400 mg by mouth Twice daily   . oxyCODONE (ROXICODONE) 5  mg Oral Tablet Take 1 Tab (5 mg total) by mouth Every 6 hours as needed for Pain       Allergies:  Allergies   Allergen Reactions   . Vicodin [Hydrocodone-Acetaminophen] Itching       PHYSICAL EXAM     Vitals:  Filed Vitals:    10/08/17 1319 10/08/17 1400 10/08/17 1422 10/08/17 1641   BP: (!) 148/104  (!) 162/92 140/82   Pulse: 80 78 76 80   Resp: _0 Temp:       SpO2: 100%  100% 98%       Pulse ox  100% on None (Room Air) interpreted by me as: Normal    Physical Exam   Constitutional: She is oriented to person, place, and time and well-developed, well-nourished, and in no distress.   HENT:   Head: Normocephalic and atraumatic.   Right Ear: External ear normal.   Left Ear: External ear normal.   Eyes: Pupils are equal, round, and reactive to light. Conjunctivae are normal. No scleral icterus.   Neck: Neck supple.   Cardiovascular: Normal rate, regular rhythm, normal heart sounds and intact distal pulses. Exam reveals no gallop and no friction rub.   No murmur heard.  Pulmonary/Chest: Effort normal and breath sounds normal. No  respiratory distress. She has no wheezes.   Abdominal: Soft. Bowel sounds are normal. She exhibits no distension. There is no hepatosplenomegaly. There is CVA tenderness (right). There is no rigidity, no rebound, no tenderness at McBurney's point and negative Murphy's sign.   Musculoskeletal: She exhibits no edema or tenderness.   Lymphadenopathy:     She has no cervical adenopathy.   Neurological: She is alert and oriented to person, place, and time.   Skin: Skin is warm and dry.   Psychiatric: Affect and judgment normal.   Nursing note and vitals reviewed.      DIAGNOSTIC STUDIES     Labs:    Results for orders placed or performed during the hospital encounter of 39/03/00   BASIC METABOLIC PANEL   Result Value Ref Range    SODIUM 135 (L) 136 - 145 mmol/L    POTASSIUM 5.4 (H) 3.4 - 5.1 mmol/L    CHLORIDE 104 101 - 111 mmol/L    CO2 TOTAL 21 (L) 22 - 32 mmol/L    ANION GAP 10 3 - 11 mmol/L    CALCIUM 9.4 8.6 - 10.3 mg/dL    GLUCOSE 123 (H) 70 - 110 mg/dL    BUN 29 (H) 6 - 20 mg/dL    CREATININE 1.92 (H) 0.44 - 1.00 mg/dL    BUN/CREA RATIO 15 6 - 22    ESTIMATED GFR 28 (L) >60 mL/min/1.45m2   URINALYSIS, MACROSCOPIC   Result Value Ref Range    COLOR Light Yellow Light Yellow, Straw, Yellow    APPEARANCE Clear Clear    PH 6.0 <8.0    LEUKOCYTES Negative Negative WBCs/uL    NITRITE Negative Negative    PROTEIN 100  (A) Negative mg/dL    GLUCOSE Negative Negative mg/dL    KETONES Negative Negative mg/dL    UROBILINOGEN 0.2  <=2.0 mg/dL    BILIRUBIN Negative Negative mg/dL    BLOOD Moderate (A) Negative mg/dL    SPECIFIC GRAVITY 1.020 <1.022   CBC WITH DIFF   Result Value Ref Range    WBC 4.7 4.0 - 11.0 x10^3/uL    RBC 4.38 4.00 - 5.10 x10^6/uL    HGB 13.2  12.0 - 15.5 g/dL    HCT 39.0 36.0 - 45.0 %    MCV 89.0 82.0 - 97.0 fL    MCH 30.2 27.5 - 33.2 pg    MCHC 33.9 32.0 - 36.0 g/dL    RDW 14.2 11.0 - 16.0 %    PLATELETS 236 150 - 450 x10^3/uL    MPV 9.5 7.4 - 10.5 fL    NEUTROPHIL % 66 43 - 76 %    LYMPHOCYTE % 25 15 -  43 %    MONOCYTE % 7 5 - 12 %    EOSINOPHIL % 2 0 - 5 %    BASOPHIL % 1 0 - 3 %    NEUTROPHIL # 3.10 1.50 - 6.50 x10^3/uL    LYMPHOCYTE # 1.20 1.00 - 4.80 x10^3/uL    MONOCYTE # 0.30 0.20 - 0.90 x10^3/uL    EOSINOPHIL # 0.10 0.00 - 0.50 x10^3/uL    BASOPHIL # 0.00 0.00 - 0.10 x10^3/uL   URINALYSIS, MICROSCOPIC   Result Value Ref Range    RBCS 5-10 (A) 0 - 2 /hpf    WBCS 0-2 0 - 2 /hpf    BACTERIA None None /hpf    SQUAMOUS EPITHELIAL 0-2 0 - 2 /hpf    TRANSITIONAL EPITHELIAL 0-2 0 - 2 /hpf     Labs reviewed and interpreted by me.    Radiology:    Results for orders placed or performed during the hospital encounter of 10/08/17 (from the past 72 hour(s))   US KIDNEY     Status: None    Narrative    RADIOLOGIST: Theressa Millard, MD    EXAMINATION: US KIDNEY     EXAM DATE/TIME:  10/08/2017 1:26 PM    CLINICAL INDICATION: right flank pain    COMPARISON: None.      FINDINGS  RIGHT KIDNEY:   - SIZE: 11 cm. No hydronephrosis.  - MORPHOLOGY:  Normal echogenicity. Normal thickness of cortex. Cortico  medullary differentiation well preserved.  - ADDITIONAL COMMENT: No stones or solid masses.    LEFT KIDNEY: Limited visualization of left kidney due to body habitus.  - SIZE: 11 cm. No hydronephrosis.  - MORPHOLOGY:  Normal echogenicity. Normal thickness of cortex. Cortico  medullary differentiation well preserved.    URETERAL JETS:  - RIGHT: Not seen  - LEFT:  Not seen     URINARY BLADDER:  - PREVOID APPEARANCE: Only partially distended and cannot be commented  upon.  - POST VOID VOLUME:  Not assessed.      Impression    1. Right kidney shows normal echogenicity. No hydronephrosis.  2. Left kidney shows normal echogenicity. No hydronephrosis.     CT ABDOMEN PELVIS WO IV CONTRAST     Status: None    Narrative    RADIOLOGIST: Theressa Millard, MD    EXAMINATION: CT ABDOMEN PELVIS WO IV CONTRAST     CLINICAL INDICATION: right flank pain    TECHNIQUE: Axial plane images were performed. Coronal and Sagittal  reformatted image were  obtained.    COMPARISON: None.      LOWER CHEST  - LUNG BASES/DIAPHRAGM: Normal appearance.     UPPER ABDOMEN: Assessment limited by absence of iv contrast.  - LIVER: Normal size and morphology of the liver. There are no focal mass  lesions.  - GALLBLADDER: Post cholecystectomy.  - BILIARY TREE: Normal appearance.  - PANCREAS: Normal appearance.  - SPLEEN: Normal appearance.  - EPIGASTRIC LYMPH NODES: None enlarged    MID  ABDOMEN: Assessment limited by absence of iv contrast.  - ADRENALS: Normal appearance.  - RIGHT KIDNEY: Normal appearance.   - LEFT KIDNEY: There is a 3 cm lesion in the mid aspect of left kidney that  measures 28 Hounsfield units. This is indeterminate for solid versus cystic  lesion.   - RETROPERITONEAL LYMPH NODES: None enlarged  - RETROPERITONEUM: Normal appearance.  - LATERAL ABDOMINAL WALL (FLANK): Normal appearance.  - ANTERIOR ABDOMINAL WALL: Normal appearance.  - AORTA: Normal appearance.  - IVC: Normal appearance    GI TRACT: Assessment limited by absence of oral and iv contrast.  - STOMACH: Normal appearance.  - DUODENUM: Normal appearance.  - SMALL BOWEL:  Normal appearance.  - LARGE BOWEL:  Large amount of stool in the colon, greater than  physiologic amount. There is no bowel obstruction. Mild diverticulosis is  present.  - APPENDIX: normal appearance.  - RECTUM:  see below under pelvis.  - MESENTERIC VESSELS: No calcification.    - MESENTERY/PERITONEAL CAVITY/OMENTUM: Normal appearance.  - MESENTERIC LYMPH NODES: None enlarged    LOWER ABDOMEN/PELVIS: Assessment limited by absence of oral and iv  contrast.  - URINARY BLADDER: Normal appearance.  - REPRODUCTIVE ORGANS:  There is evidence of tubal ligation.  - PELVIC SOFT TISSUES: Normal appearance.  - RECTUM:  Normal appearance.  - PERI ANAL REGION: Normal appearance.  - PELVIC FLOOR: Normal appearance.  - PELVIC LYMPH NODES: Normal appearance.  - INGUINAL REGION/GROINS: Normal appearance.  - BONY PELVIS: Normal appearance.     SPINE:  Normal appearance.       Impression    1. No inflammatory process or mass lesions.   Normal appendix.  2. No stones or hydronephrosis.  3. There is a 3 cm indeterminate lesion in the left kidney that measures 28  Hounsfield units and could represent a complex cyst versus solid mass. This  requires additional imaging workup with MRI. If patient has acute renal  failure and cannot receive IV contrast, a noncontrast MRI should be  considered.    MIPS PERFORMANCE [employer]RICS:  1. Dose reduction technique used. Automatic exposure control (AEC) was  applied.  2. Count of high-dose radiation studies (CT and cardiac and NM) last 12  months: 1.  3. DLP: 2773.40 mGy.cm     MRI ABDOMEN WO IV CONTRAST     Status: None    Narrative    RADIOLOGIST: Gennie Alma, MD    EXAMINATION: MRI ABDOMEN WO IV CONTRAST     EXAM DATE/TIME:  10/08/2017 3:28 PM    CLINICAL INDICATION: kidney mass    COMPARISON: None.    TECHNIQUE: MRI of the abdomen was performed without contrast. Contrast  could not be given due to GFR less than 30.      FINDINGS:  Compared to noncontrast CT from earlier today.    There is a 2.4 x 3.5 cm heterogeneous appearing solid mass in the  interpolar region of the left kidney. On the noncontrast portion of this  exam, this is very suspicious for renal cell carcinoma. Note that contrast  could not be given due to poor renal function. No other renal abnormalities  are identified. There is no retroperitoneal lymphadenopathy. The liver,  spleen, pancreas and adrenal glands are unremarkable.      Impression    3.5 cm mass in the interpolar region of the left kidney. This  appears to be a solid mass and is very suspicious for renal cell carcinoma.  If renal function improves,  follow-up study with contrast recommended.       MRI abdomen without contrast  FINDINGS:  Compared to noncontrast CT from earlier today.     There is a 2.4 x 3.5 cm heterogeneous appearing solid mass in the  interpolar region of the left kidney. On the  noncontrast portion of this  exam, this is very suspicious for renal cell carcinoma. Note that contrast  could not be given due to poor renal function. No other renal abnormalities  are identified. There is no retroperitoneal lymphadenopathy. The liver,  spleen, pancreas and adrenal glands are unremarkable.     IMPRESSION:  3.5 cm mass in the interpolar region of the left kidney. This  appears to be a solid mass and is very suspicious for renal cell carcinoma.  If renal function improves, follow-up study with contrast recommended.       Cardiac Monitor:    sinus rhythm, rate of 80 bpm, no ectopy (interpreted by me).     Procedures:  CT and MRI both without contrast due to AKI    ED PROGRESS NOTE / [place] records reviewed by me:  Jaynie Crumble, DO  [facility] PGY 2    Recent UA from [facility] 10/01/2017 with proteinuria, otherwise normal.      Orders Placed This Encounter   . US KIDNEY   . CT ABDOMEN PELVIS WO IV CONTRAST   . MRI ABDOMEN WO IV CONTRAST   . URINALYSIS WITH REFLEX TO CULTURE IF INDICATED [facility] ONLY   . CBC/DIFF   . BASIC METABOLIC PANEL   . URINALYSIS, MACROSCOPIC   . CBC WITH DIFF   . URINALYSIS, MICROSCOPIC   . INSERT & MAINTAIN PERIPHERAL IV ACCESS   . ketorolac (TORADOL) 30 mg/mL injection   . NS bolus infusion 1,000 mL   . oxyCODONE (ROXICODONE) 5 mg Oral Tablet     Comments added by Everardo Pacific, DO on 10/08/17 at 17:16.   UA and renal US right side ordered.  Initial exam and orders placed to include imaging and blood work.    Comments added by Everardo Pacific, DO on 10/08/17 at 17:16.   Blood work interpreted. CBC WNL. BMP with hyperkalemia and significant change in sCr. eGFR 28. [place] without hydronephrosis and cyst on, left. (her flank pain is on the right). CT without of abd and pelvis placed which revealed fecal retention in the ascending colon and small mass vrs cyst in left kidney which recommended follow up with MRI without contrast.     Comments added by  Everardo Pacific, DO on 10/08/17 at 17:16.    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 Discussed case with Dr Kerin Perna who suggests outpatient follow up and probably full vrs partial nephrectomy.     These results were disscussed with the patient with attending, Dr Glean Salen, DO. She handled the diagnosis appropriately and was visibly upset. Empathy was expressed and patient felt grateful and thanked our team for finding this and our compassion toward her.   Lab Results   Component Value Date    GFR 28 (L) 10/08/2017    GFR >60 12/07/2014        Pre-Disposition Vitals:  Filed Vitals:    10/08/17 1319 10/08/17 1400 10/08/17 1422 10/08/17 1641   BP: (!) 148/104  (!) 162/92 140/82   Pulse: 80 78 76 80   Resp: _0 18  Temp:       SpO2: 100%  100% 98%           CLINICAL IMPRESSION     1. Mass of left kidney concerning for renal cell carcinoma  2. AKI 2/2 #1  3. Hyperkalemia 2/2 #1  4. Fecal retention   5. Hypertension     DISPOSITION/PLAN     Discharged        Prescriptions:     Discharge Medication List as of 10/08/2017  4:25 PM      START taking these medications    Details   oxyCODONE (ROXICODONE) 5 mg Oral Tablet Take 1 Tab (5 mg total) by mouth Every 6 hours as needed for Pain, Disp-15 Tab, R-0, Print             Follow-Up:     [address]  [place] [address]  [phone]    Call       Ruta [address]s, [address]  [place] [address]  [phone]    Call in 2 days  Emergency Department Follow Up      Condition at Disposition: [place], DO  [facility] PGY 2    Patient was seen and discussed with attending Dr Glean Salen, DO.

## 2017-10-08 NOTE — ED Nurses Note (Signed)
MRI Form faxed to MRI Dept.

## 2017-10-08 NOTE — ED Nurses Note (Signed)
Dr. Lilian Coma, resident M.D. In to see pt.

## 2017-10-08 NOTE — ED Nurses Note (Signed)
To bathroom for attempted clean catch urine collection.

## 2017-10-08 NOTE — ED Nurses Note (Signed)
Patient discharged home with family.  AVS reviewed with patient/care giver.  A written copy of the AVS and discharge instructions was given to the patient/care giver.  Questions sufficiently answered as needed.  Patient/care giver encouraged to follow up with PCP as indicated.  In the event of an emergency, patient/care giver instructed to call 911 or go to the nearest emergency room.        Current Discharge Medication List      START taking these medications.      Details   oxyCODONE 5 mg Tablet  Commonly known as:  ROXICODONE   5 mg, Oral, EVERY 6 HOURS PRN  Qty:  15 Tab  Refills:  0        CONTINUE these medications - NO CHANGES were made during your visit.      Details   albuterol sulfate 90 mcg/actuation HFA Aerosol Inhaler  Commonly known as:  PROVENTIL or VENTOLIN or PROAIR   1-2 Puffs, Inhalation, EVERY 6 HOURS PRN  Refills:  0     amLODIPine 2.5 mg Tablet  Commonly known as:  NORVASC   2.5 mg, Oral, DAILY  Refills:  0     CLARITIN 10 mg Tablet  Generic drug:  loratadine   10 mg, Oral, DAILY PRN  Refills:  0     fluticasone 50 mcg/actuation Spray, Suspension  Commonly known as:  FLONASE   1 Spray, Each Nostril, DAILY  Refills:  0     glipiZIDE 5 mg Tablet  Commonly known as:  GLUCOTROL   2.5 mg, Oral, 2 TIMES DAILY BEFORE MEALS, Take 30 minutes before meals  Refills:  0     loperamide 2 mg Capsule  Commonly known as:  IMODIUM   2 mg, Oral, EVERY 4 HOURS PRN  Refills:  0     losartan-hydrochlorothiazide 50-12.5 mg Tablet  Commonly known as:  HYZAAR   1 Tab, Oral, DAILY  Refills:  0     magnesium oxide 400 mg (241.3 mg magnesium) Tablet  Commonly known as:  MAG-OX   400 mg, Oral, 2 TIMES DAILY  Refills:  0

## 2017-10-08 NOTE — ED Triage Notes (Signed)
Pt reports recently treated for sinus infection and now has rt flank pain. Pt has been seen by urgent care multiple times for same. Pt finished Bactrim for UTI on Saturday. Pt went to pcp and was told to come here.

## 2017-10-08 NOTE — Discharge Instructions (Signed)
Return to ED, as we discussed, with any worsening symptoms, new concerns or inability to follow up as instructed in chart.

## 2017-10-09 ENCOUNTER — Encounter (INDEPENDENT_AMBULATORY_CARE_PROVIDER_SITE_OTHER): Payer: Self-pay | Admitting: Urology

## 2017-10-09 ENCOUNTER — Ambulatory Visit (INDEPENDENT_AMBULATORY_CARE_PROVIDER_SITE_OTHER): Payer: No Typology Code available for payment source | Admitting: Urology

## 2017-10-09 DIAGNOSIS — C642 Malignant neoplasm of left kidney, except renal pelvis: Secondary | ICD-10-CM

## 2017-10-09 DIAGNOSIS — N2889 Other specified disorders of kidney and ureter: Principal | ICD-10-CM

## 2017-10-09 HISTORY — DX: Malignant neoplasm of left kidney, except renal pelvis: C64.2

## 2017-10-09 NOTE — Assessment & Plan Note (Addendum)
The differential diagnosis for the left renal mass includes complex renal cyst, benign renal tumor, or malignancy.  If the mass was found to be enhancing on a contrast enhanced image that it would most likely be a renal cell carcinoma.  The mass is less than 4 cm and therefore has a very low chance of metastases and appears to be amenable to a partial nephrectomy.  She has unexplained worsening renal function which I would like to have evaluated prior to proceeding with surgery and the patient is being scheduled with her primary care physician.  Plan to proceed with surgery within the next 4-6 weeks.  I have operative time scheduled with my partner on the 30th of this month, but that may not be enough time to evaluate her medical issues and is medically safe to wait until next month to do the surgery.

## 2017-10-09 NOTE — H&P (View-Only) (Signed)
[facility]  [address] [place]  [place], [place] [address]    New Office Visit    SUBJECTIVE:    Patient ID:   [name], 52 y.o., female      Chief Complaint:   Chief Complaint   Patient presents with   . Renal Mass   . New Patient   . Emergency Room Follow Up       History of Present Illness:    52 y.o. female presents for evaluation of an incidentally found left renal mass.  Patient presents emergency department yesterday with a 2-3 week history of right-sided flank pain associated with nausea and radiation down to her right hip.  The pain was sharp and stabbing and was worse with movement in certain positions.  The pain has improved since yesterday.  A CT scan without contrast was performed to rule out nephrolithiasis.  The CT did not show any hydronephrosis or nephrolithiasis, but did show a suspicious mass in the midpole of the left kidney.  A noncontrast MRI was performed, due to the finding of elevated serum creatinine of 1.9, and that showed heterogeneity of a 3.5 cm left mid pole renal mass as well as some small simple cysts in each kidney.  Patient denies any left-sided pain or hematuria.  She does have constipation, nocturia, and stress urinary incontinence.    Review of systems is positive for chills, fatigue, nausea, diarrhea, seasonal allergies and depression.    Past Medical History:  Past Medical History:   Diagnosis Date   . Anxiety    . Arthritis    . Cellulitis 10/24/14    left foot   . Depression    . Diabetes mellitus (CMS [place])    . Fatty liver    . Headache(784.0)    . Irritable bowel syndrome    . Macular edema    . Obesity    . White coat hypertension            Past Surgical History:  Past Surgical History:   Procedure Laterality Date   . ECTOPIC PREGNANCY SURGERY  1999   . HX CATARACT REMOVAL     . HX CHOLECYSTECTOMY  09/10/96   . 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 HX PELVIC LAPAROSCOPY  05/24/98    Ectopic pregnancy   . HX TUBAL LIGATION  09/10/2012     [facility]   . HYMENECTOMY             Allergies:  Allergies   Allergen Reactions   . Vicodin [Hydrocodone-Acetaminophen] Itching       Home Medications:  Prior to Admission medications    Medication Sig Start Date End Date Taking? Authorizing Provider   albuterol sulfate (PROVENTIL OR VENTOLIN OR PROAIR) 90 mcg/actuation Inhalation HFA Aerosol Inhaler Take 1-2 Puffs by inhalation Every 6 hours as needed    Provider, Historical   amLODIPine (NORVASC) 2.5 mg Oral Tablet Take 2.5 mg by mouth Once a day   Yes Provider, Historical   fluticasone (FLONASE) 50 mcg/actuation Nasal Spray, Suspension 1 Spray by Each Nostril route Once a day   Yes Provider, Historical   glipiZIDE (GLUCOTROL) 5 mg Oral Tablet Take 2.5 mg by mouth Twice a day before meals Take 30 minutes before meals   Yes Provider, Historical   loperamide (IMODIUM) 2 mg Oral Capsule Take 2 mg by mouth Every 4 hours as needed    Provider, Historical   loratadine (CLARITIN)  10 mg Oral Tablet Take 10 mg by mouth Once per day as needed    Yes Provider, Historical   losartan-hydrochlorothiazide (HYZAAR) 100-12.5 mg Oral Tablet Take 1 Tab by mouth Once a day   Yes Provider, Historical   magnesium oxide (MAG-OX) 400 mg (241.3 mg magnesium) Oral Tablet Take 400 mg by mouth Twice daily   Yes Provider, Historical   oxyCODONE (ROXICODONE) 5 mg Oral Tablet Take 1 Tab (5 mg total) by mouth Every 6 hours as needed for Pain  Patient not taking: Reported on 10/09/2017 10/08/17   Deuell, Zella Richer, DO       Social History:  Social History     Tobacco Use   . Smoking status: Never Smoker   . Smokeless tobacco: Never Used   Substance Use Topics   . Alcohol use: No   . Drug use: No       Family History:  Family Medical History:     Problem Relation (Age of Onset)    Cancer Maternal Grandmother    Diabetes Father    Hypertension Maternal Uncle              Review of systems:  Constitutional: No weight loss or night sweats   Genitourinary: see HPI above.  ALL OTHERS NEGATIVE, Except  as mentioned in HPI    OBJECTIVE:    Vitals:  BP (!) 187/107   Pulse 88   Temp 36.2 C (97.1 F) (Thermal Scan)   Ht 1.702 m (5\' 7" )   Wt 132.8 kg (292 lb 12.8 oz)   BMI 45.86 kg/m         Physical examination:  Constitutional: Appears well, no acute distress.   Eyes: EOMI  ENT: Neck supple, MMM   Cardiovascular: Normal sinus rate, regular rhythm.  No murmur audible.  Respiratory: Clear to auscultation.   GI: Abdomen soft, obese, non-tender. No mass. +BS's.       Integumentary/Skin: No rashes or lesions  Musculoskeletal: Grossly normal  GU: no CVA tenderness.    no bladder tenderness  Psychiatric: Mood and affect normal.     Labs:  Pertinent labs reviewed.      Imaging studies:  Pertinent imaging studies reviewed.  My interpretation of the CT and MRI is included in the HPI.    Assessment and Plan:    Left renal mass  The differential diagnosis for the left renal mass includes complex renal cyst, benign renal tumor, or malignancy.  If the mass was found to be enhancing on a contrast enhanced image that it would most likely be a renal cell carcinoma.  The mass is less than 4 cm and therefore has a very low chance of metastases and appears to be amenable to a partial nephrectomy.  She has unexplained worsening renal function which I would like to have evaluated prior to proceeding with surgery and the patient is being scheduled with her primary care physician.  Plan to proceed with surgery within the next 4-6 weeks.  I have operative time scheduled with my partner on the 30th of this month, but that may not be enough time to evaluate her medical issues and is medically safe to wait until next month to do the surgery.    No orders of the defined types were placed in this encounter.        This note was completed using voice recognition technology. Please excuse any errors that have occurred as a result and contact me if any parts of  the note are unclear.

## 2017-10-09 NOTE — H&P (Signed)
[facility]  [address] [place]  [place], [place] [address]    New Office Visit    SUBJECTIVE:    Patient ID:   [name], 52 y.o., female      Chief Complaint:   Chief Complaint   Patient presents with   . Renal Mass   . New Patient   . Emergency Room Follow Up       History of Present Illness:    52 y.o. female presents for evaluation of an incidentally found left renal mass.  Patient presents emergency department yesterday with a 2-3 week history of right-sided flank pain associated with nausea and radiation down to her right hip.  The pain was sharp and stabbing and was worse with movement in certain positions.  The pain has improved since yesterday.  A CT scan without contrast was performed to rule out nephrolithiasis.  The CT did not show any hydronephrosis or nephrolithiasis, but did show a suspicious mass in the midpole of the left kidney.  A noncontrast MRI was performed, due to the finding of elevated serum creatinine of 1.9, and that showed heterogeneity of a 3.5 cm left mid pole renal mass as well as some small simple cysts in each kidney.  Patient denies any left-sided pain or hematuria.  She does have constipation, nocturia, and stress urinary incontinence.    Review of systems is positive for chills, fatigue, nausea, diarrhea, seasonal allergies and depression.    Past Medical History:  Past Medical History:   Diagnosis Date   . Anxiety    . Arthritis    . Cellulitis 10/24/14    left foot   . Depression    . Diabetes mellitus (CMS [place])    . Fatty liver    . Headache(784.0)    . Irritable bowel syndrome    . Macular edema    . Obesity    . White coat hypertension            Past Surgical History:  Past Surgical History:   Procedure Laterality Date   . ECTOPIC PREGNANCY SURGERY  1999   . HX CATARACT REMOVAL     . HX CHOLECYSTECTOMY  09/10/96   . 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 HX PELVIC LAPAROSCOPY  05/24/98    Ectopic pregnancy   . HX TUBAL LIGATION  09/10/2012     Fil[facility]ie Clips   . HYMENECTOMY             Allergies:  Allergies   Allergen Reactions   . Vicodin [Hydrocodone-Acetaminophen] Itching       Home Medications:  Prior to Admission medications    Medication Sig Start Date End Date Taking? Authorizing Provider   albuterol sulfate (PROVENTIL OR VENTOLIN OR PROAIR) 90 mcg/actuation Inhalation HFA Aerosol Inhaler Take 1-2 Puffs by inhalation Every 6 hours as needed    Provider, Historical   amLODIPine (NORVASC) 2.5 mg Oral Tablet Take 2.5 mg by mouth Once a day   Yes Provider, Historical   fluticasone (FLONASE) 50 mcg/actuation Nasal Spray, Suspension 1 Spray by Each Nostril route Once a day   Yes Provider, Historical   glipiZIDE (GLUCOTROL) 5 mg Oral Tablet Take 2.5 mg by mouth Twice a day before meals Take 30 minutes before meals   Yes Provider, Historical   loperamide (IMODIUM) 2 mg Oral Capsule Take 2 mg by mouth Every 4 hours as needed    Provider, Historical   loratadine (CLARITIN)  10 mg Oral Tablet Take 10 mg by mouth Once per day as needed    Yes Provider, Historical   losartan-hydrochlorothiazide (HYZAAR) 100-12.5 mg Oral Tablet Take 1 Tab by mouth Once a day   Yes Provider, Historical   magnesium oxide (MAG-OX) 400 mg (241.3 mg magnesium) Oral Tablet Take 400 mg by mouth Twice daily   Yes Provider, Historical   oxyCODONE (ROXICODONE) 5 mg Oral Tablet Take 1 Tab (5 mg total) by mouth Every 6 hours as needed for Pain  Patient not taking: Reported on 10/09/2017 10/08/17   Deuell, Zella Richer, DO       Social History:  Social History     Tobacco Use   . Smoking status: Never Smoker   . Smokeless tobacco: Never Used   Substance Use Topics   . Alcohol use: No   . Drug use: No       Family History:  Family Medical History:     Problem Relation (Age of Onset)    Cancer Maternal Grandmother    Diabetes Father    Hypertension Maternal Uncle              Review of systems:  Constitutional: No weight loss or night sweats   Genitourinary: see HPI above.  ALL OTHERS NEGATIVE, Except  as mentioned in HPI    OBJECTIVE:    Vitals:  BP (!) 187/107   Pulse 88   Temp 36.2 C (97.1 F) (Thermal Scan)   Ht 1.702 m (5\' 7" )   Wt 132.8 kg (292 lb 12.8 oz)   BMI 45.86 kg/m         Physical examination:  Constitutional: Appears well, no acute distress.   Eyes: EOMI  ENT: Neck supple, MMM   Cardiovascular: Normal sinus rate, regular rhythm.  No murmur audible.  Respiratory: Clear to auscultation.   GI: Abdomen soft, obese, non-tender. No mass. +BS's.       Integumentary/Skin: No rashes or lesions  Musculoskeletal: Grossly normal  GU: no CVA tenderness.    no bladder tenderness  Psychiatric: Mood and affect normal.     Labs:  Pertinent labs reviewed.      Imaging studies:  Pertinent imaging studies reviewed.  My interpretation of the CT and MRI is included in the HPI.    Assessment and Plan:    Left renal mass  The differential diagnosis for the left renal mass includes complex renal cyst, benign renal tumor, or malignancy.  If the mass was found to be enhancing on a contrast enhanced image that it would most likely be a renal cell carcinoma.  The mass is less than 4 cm and therefore has a very low chance of metastases and appears to be amenable to a partial nephrectomy.  She has unexplained worsening renal function which I would like to have evaluated prior to proceeding with surgery and the patient is being scheduled with her primary care physician.  Plan to proceed with surgery within the next 4-6 weeks.  I have operative time scheduled with my partner on the 30th of this month, but that may not be enough time to evaluate her medical issues and is medically safe to wait until next month to do the surgery.    No orders of the defined types were placed in this encounter.        This note was completed using voice recognition technology. Please excuse any errors that have occurred as a result and contact me if any parts of  the note are unclear.

## 2017-10-22 ENCOUNTER — Telehealth (INDEPENDENT_AMBULATORY_CARE_PROVIDER_SITE_OTHER): Payer: Self-pay | Admitting: Urology

## 2017-10-22 NOTE — Telephone Encounter (Signed)
Pt was seen by [facility] on 10/15/17 and saw Dr. Georgia Dom for pre op visit and lab work. I called [facility] to Check status of clearance and lab results. They said the lab have not yet been reviewed by the doctor. They will send her a message to review and have result faxed over as soon as possible. [place], [place]  10/22/2017, 15:04

## 2017-10-22 NOTE — Nursing Note (Signed)
Message sent to Dr. Glennis Brink, MA  10/22/2017, 13:32  Pt called to find out if she is able to have her nephrectomy on Wednesday 10/24/17 or later. Pt was seen by primary on 10/15/16 but we do not have the pre op info and labs yet. She will call and get them sent over to [place] asap. [place], [place]  10/22/2017, 13:31

## 2017-10-22 NOTE — Telephone Encounter (Signed)
-----   Message from Ruta Hinds, MD sent at 10/22/2017  1:41 PM EST -----  Regarding: RE: renal mass  If she is cleared for surgery, then we should get her scheduled.  ----- Message -----  From: Dimas Chyle, [place]  Sent: 10/22/2017   1:25 PM  To: Ruta Hinds, MD  Subject: renal mass                                       Pt called to find out if she is able to have her nephrectomy on Wednesday 10/24/17 or later. Pt was seen by primary on 10/15/16 but we do not have the pre op info and labs yet. She will call and get them sent over to [place]. [place], [place]  10/22/2017, 13:31

## 2017-10-23 ENCOUNTER — Encounter (HOSPITAL_BASED_OUTPATIENT_CLINIC_OR_DEPARTMENT_OTHER): Payer: Self-pay

## 2017-10-23 ENCOUNTER — Ambulatory Visit (HOSPITAL_BASED_OUTPATIENT_CLINIC_OR_DEPARTMENT_OTHER)
Admission: RE | Admit: 2017-10-23 | Discharge: 2017-10-23 | Disposition: A | Discharge status: Home / Self Care | Payer: No Typology Code available for payment source | Source: Ambulatory Visit | Attending: Urology | Admitting: Urology

## 2017-10-23 ENCOUNTER — Other Ambulatory Visit (INDEPENDENT_AMBULATORY_CARE_PROVIDER_SITE_OTHER): Payer: Self-pay | Admitting: Urology

## 2017-10-23 HISTORY — DX: Type 2 diabetes mellitus without complications: E11.9

## 2017-10-23 HISTORY — DX: Hyperlipidemia, unspecified: E78.5

## 2017-10-23 HISTORY — DX: Essential (primary) hypertension: I10

## 2017-10-23 MED ORDER — CEFAZOLIN 2 GRAM/50 ML IN DEXTROSE (ISO-OSMOTIC) INTRAVENOUS PIGGYBACK
2.0000 g | INJECTION | Freq: Once | INTRAVENOUS | Status: AC
Start: 2017-10-24 — End: 2017-10-24
  Administered 2017-10-24: 2 g via INTRAVENOUS
  Filled 2017-10-23: qty 50

## 2017-10-23 NOTE — Discharge Instructions (Signed)
Please contact your provider if you are experiencing cold or flu-like symptoms and/or any signs of infection prior to your procedure. Nothing to eat or drink after midnight the day of surgery except a sip of water with medications marked.

## 2017-10-24 ENCOUNTER — Inpatient Hospital Stay (HOSPITAL_BASED_OUTPATIENT_CLINIC_OR_DEPARTMENT_OTHER)
Admission: RE | Admit: 2017-10-24 | Discharge: 2017-10-28 | DRG: 657 | Disposition: A | Discharge status: Home / Self Care | Payer: No Typology Code available for payment source | Source: Ambulatory Visit | Attending: Urology | Admitting: Urology

## 2017-10-24 ENCOUNTER — Inpatient Hospital Stay (HOSPITAL_BASED_OUTPATIENT_CLINIC_OR_DEPARTMENT_OTHER): Payer: No Typology Code available for payment source | Admitting: Pain Medicine

## 2017-10-24 ENCOUNTER — Encounter (HOSPITAL_BASED_OUTPATIENT_CLINIC_OR_DEPARTMENT_OTHER)
Admission: RE | Disposition: A | Discharge status: Home / Self Care | Payer: Self-pay | Source: Ambulatory Visit | Attending: Urology

## 2017-10-24 ENCOUNTER — Inpatient Hospital Stay (HOSPITAL_BASED_OUTPATIENT_CLINIC_OR_DEPARTMENT_OTHER): Payer: No Typology Code available for payment source | Admitting: Certified Registered"

## 2017-10-24 ENCOUNTER — Encounter (HOSPITAL_BASED_OUTPATIENT_CLINIC_OR_DEPARTMENT_OTHER): Payer: Self-pay | Admitting: Certified Registered"

## 2017-10-24 ENCOUNTER — Inpatient Hospital Stay (HOSPITAL_BASED_OUTPATIENT_CLINIC_OR_DEPARTMENT_OTHER): Payer: No Typology Code available for payment source

## 2017-10-24 ENCOUNTER — Other Ambulatory Visit (HOSPITAL_BASED_OUTPATIENT_CLINIC_OR_DEPARTMENT_OTHER): Payer: Self-pay | Admitting: Urology

## 2017-10-24 DIAGNOSIS — D631 Anemia in chronic kidney disease: Secondary | ICD-10-CM | POA: Diagnosis present

## 2017-10-24 DIAGNOSIS — N184 Chronic kidney disease, stage 4 (severe): Secondary | ICD-10-CM | POA: Diagnosis not present

## 2017-10-24 DIAGNOSIS — Z7984 Long term (current) use of oral hypoglycemic drugs: Secondary | ICD-10-CM

## 2017-10-24 DIAGNOSIS — K589 Irritable bowel syndrome without diarrhea: Secondary | ICD-10-CM | POA: Diagnosis present

## 2017-10-24 DIAGNOSIS — T502X5A Adverse effect of carbonic-anhydrase inhibitors, benzothiadiazides and other diuretics, initial encounter: Secondary | ICD-10-CM | POA: Diagnosis present

## 2017-10-24 DIAGNOSIS — E11628 Type 2 diabetes mellitus with other skin complications: Secondary | ICD-10-CM | POA: Diagnosis present

## 2017-10-24 DIAGNOSIS — L03116 Cellulitis of left lower limb: Secondary | ICD-10-CM | POA: Diagnosis present

## 2017-10-24 DIAGNOSIS — R253 Fasciculation: Secondary | ICD-10-CM | POA: Diagnosis present

## 2017-10-24 DIAGNOSIS — D179 Benign lipomatous neoplasm, unspecified: Secondary | ICD-10-CM

## 2017-10-24 DIAGNOSIS — E119 Type 2 diabetes mellitus without complications: Secondary | ICD-10-CM | POA: Diagnosis present

## 2017-10-24 DIAGNOSIS — E785 Hyperlipidemia, unspecified: Secondary | ICD-10-CM | POA: Diagnosis present

## 2017-10-24 DIAGNOSIS — M62838 Other muscle spasm: Secondary | ICD-10-CM | POA: Diagnosis present

## 2017-10-24 DIAGNOSIS — K760 Fatty (change of) liver, not elsewhere classified: Secondary | ICD-10-CM | POA: Diagnosis present

## 2017-10-24 DIAGNOSIS — D500 Iron deficiency anemia secondary to blood loss (chronic): Secondary | ICD-10-CM | POA: Diagnosis not present

## 2017-10-24 DIAGNOSIS — C642 Malignant neoplasm of left kidney, except renal pelvis: Principal | ICD-10-CM | POA: Diagnosis present

## 2017-10-24 DIAGNOSIS — K5901 Slow transit constipation: Secondary | ICD-10-CM | POA: Diagnosis present

## 2017-10-24 DIAGNOSIS — F329 Major depressive disorder, single episode, unspecified: Secondary | ICD-10-CM | POA: Diagnosis present

## 2017-10-24 DIAGNOSIS — Z809 Family history of malignant neoplasm, unspecified: Secondary | ICD-10-CM

## 2017-10-24 DIAGNOSIS — Z9049 Acquired absence of other specified parts of digestive tract: Secondary | ICD-10-CM

## 2017-10-24 DIAGNOSIS — E11311 Type 2 diabetes mellitus with unspecified diabetic retinopathy with macular edema: Secondary | ICD-10-CM | POA: Diagnosis present

## 2017-10-24 DIAGNOSIS — E1121 Type 2 diabetes mellitus with diabetic nephropathy: Secondary | ICD-10-CM | POA: Diagnosis present

## 2017-10-24 DIAGNOSIS — Z6841 Body Mass Index (BMI) 40.0 and over, adult: Secondary | ICD-10-CM

## 2017-10-24 DIAGNOSIS — A0472 Enterocolitis due to Clostridium difficile, not specified as recurrent: Secondary | ICD-10-CM | POA: Diagnosis present

## 2017-10-24 DIAGNOSIS — I10 Essential (primary) hypertension: Secondary | ICD-10-CM | POA: Diagnosis present

## 2017-10-24 DIAGNOSIS — N2581 Secondary hyperparathyroidism of renal origin: Secondary | ICD-10-CM | POA: Diagnosis present

## 2017-10-24 DIAGNOSIS — R809 Proteinuria, unspecified: Secondary | ICD-10-CM | POA: Diagnosis present

## 2017-10-24 DIAGNOSIS — N179 Acute kidney failure, unspecified: Secondary | ICD-10-CM | POA: Diagnosis not present

## 2017-10-24 DIAGNOSIS — Z8249 Family history of ischemic heart disease and other diseases of the circulatory system: Secondary | ICD-10-CM

## 2017-10-24 DIAGNOSIS — Z833 Family history of diabetes mellitus: Secondary | ICD-10-CM

## 2017-10-24 DIAGNOSIS — E875 Hyperkalemia: Secondary | ICD-10-CM | POA: Diagnosis not present

## 2017-10-24 DIAGNOSIS — Z79899 Other long term (current) drug therapy: Secondary | ICD-10-CM

## 2017-10-24 DIAGNOSIS — F419 Anxiety disorder, unspecified: Secondary | ICD-10-CM | POA: Diagnosis present

## 2017-10-24 DIAGNOSIS — E1165 Type 2 diabetes mellitus with hyperglycemia: Secondary | ICD-10-CM | POA: Diagnosis present

## 2017-10-24 DIAGNOSIS — R351 Nocturia: Secondary | ICD-10-CM | POA: Diagnosis present

## 2017-10-24 DIAGNOSIS — E1122 Type 2 diabetes mellitus with diabetic chronic kidney disease: Secondary | ICD-10-CM | POA: Diagnosis present

## 2017-10-24 DIAGNOSIS — I129 Hypertensive chronic kidney disease with stage 1 through stage 4 chronic kidney disease, or unspecified chronic kidney disease: Secondary | ICD-10-CM | POA: Diagnosis present

## 2017-10-24 DIAGNOSIS — N393 Stress incontinence (female) (male): Secondary | ICD-10-CM | POA: Diagnosis present

## 2017-10-24 DIAGNOSIS — Z885 Allergy status to narcotic agent status: Secondary | ICD-10-CM

## 2017-10-24 DIAGNOSIS — Z7951 Long term (current) use of inhaled steroids: Secondary | ICD-10-CM

## 2017-10-24 DIAGNOSIS — Z780 Asymptomatic menopausal state: Secondary | ICD-10-CM

## 2017-10-24 DIAGNOSIS — N281 Cyst of kidney, acquired: Secondary | ICD-10-CM | POA: Diagnosis present

## 2017-10-24 DIAGNOSIS — J302 Other seasonal allergic rhinitis: Secondary | ICD-10-CM | POA: Diagnosis present

## 2017-10-24 DIAGNOSIS — N183 Chronic kidney disease, stage 3 (moderate): Secondary | ICD-10-CM | POA: Diagnosis present

## 2017-10-24 DIAGNOSIS — G8918 Other acute postprocedural pain: Secondary | ICD-10-CM | POA: Diagnosis not present

## 2017-10-24 DIAGNOSIS — M1990 Unspecified osteoarthritis, unspecified site: Secondary | ICD-10-CM | POA: Diagnosis present

## 2017-10-24 HISTORY — PX: KIDNEY SURGERY: SHX687

## 2017-10-24 HISTORY — PX: PARTIAL NEPHRECTOMY: SHX414

## 2017-10-24 HISTORY — PX: LAPAROSCOPIC PARTIAL NEPHRECTOMY: SUR782

## 2017-10-24 LAB — CBC W/AUTO DIFF
BASOPHIL #: 0 x10ˆ3/uL (ref 0.00–0.10)
BASOPHIL %: 0 % (ref 0–3)
EOSINOPHIL #: 0 x10ˆ3/uL (ref 0.00–0.50)
HCT: 35.2 % — ABNORMAL LOW (ref 36.0–45.0)
HCT: 35.2 % — ABNORMAL LOW (ref 36.0–45.0)
HGB: 11.5 g/dL — ABNORMAL LOW (ref 12.0–15.5)
LYMPHOCYTE %: 3 % — ABNORMAL LOW (ref 15–43)
MCHC: 32.8 g/dL (ref 32.0–36.0)
MCV: 87.6 fL (ref 82.0–97.0)
MONOCYTE %: 4 % — ABNORMAL LOW (ref 5–12)
NEUTROPHIL #: 14.3 x10ˆ3/uL — ABNORMAL HIGH (ref 1.50–6.50)
NEUTROPHIL %: 93 % — ABNORMAL HIGH (ref 43–76)
PLATELETS: 256 x10ˆ3/uL (ref 150–450)
RBC: 4.02 x10ˆ6/uL (ref 4.00–5.10)
RDW: 14.6 % (ref 11.0–16.0)

## 2017-10-24 LAB — BASIC METABOLIC PANEL
ANION GAP: 9 mmol/L (ref 3–11)
BUN/CREA RATIO: 14 (ref 6–22)
BUN: 24 mg/dL — ABNORMAL HIGH (ref 6–20)
CALCIUM: 8.4 mg/dL — ABNORMAL LOW (ref 8.6–10.3)
CHLORIDE: 108 mmol/L (ref 101–111)
CO2 TOTAL: 18 mmol/L — ABNORMAL LOW (ref 22–32)
CREATININE: 1.71 mg/dL — ABNORMAL HIGH (ref 0.44–1.00)
ESTIMATED GFR: 32 mL/min/1.73mˆ2 — ABNORMAL LOW (ref >60)
GLUCOSE: 259 mg/dL — ABNORMAL HIGH (ref 70–110)
POTASSIUM: 4.9 mmol/L (ref 3.4–5.1)
POTASSIUM: 4.9 mmol/L (ref 3.4–5.1)
SODIUM: 135 mmol/L — ABNORMAL LOW (ref 136–145)

## 2017-10-24 LAB — URINALYSIS, MICROSCOPIC

## 2017-10-24 LAB — POC FINGERSTICK GLUCOSE - BMC/JMC (RESULTS)
GLUCOSE, POC: 238 mg/dl — ABNORMAL HIGH (ref 60–100)
GLUCOSE, POC: 231 mg/dl — ABNORMAL HIGH (ref 60–100)
GLUCOSE, POC: 231 mg/dl — ABNORMAL HIGH (ref 60–100)

## 2017-10-24 LAB — URINALYSIS, MACROSCOPIC
GLUCOSE: 50 mg/dL — AB
NITRITE: NEGATIVE
PH: 6 (ref <8.0)
PROTEIN: >=500 mg/dL — AB
SPECIFIC GRAVITY: 1.012 (ref <1.022)

## 2017-10-24 LAB — TYPE AND SCREEN
ABO/RH(D): O POS
ANTIBODY SCREEN: NEGATIVE

## 2017-10-24 SURGERY — NEPHRECTOMY PARTIAL LAPAROSCOPIC
Anesthesia: General | Site: Abdomen | Laterality: Left | Wound class: Clean Wound: Uninfected operative wounds in which no inflammation occurred

## 2017-10-24 MED ORDER — CEFAZOLIN 1 GRAM/50 ML IN DEXTROSE (ISO-OSMOTIC) INTRAVENOUS PIGGYBACK
1.0000 g | INJECTION | Freq: Three times a day (TID) | INTRAVENOUS | Status: AC
Start: 2017-10-24 — End: 2017-10-25
  Administered 2017-10-24: 0 g via INTRAVENOUS
  Administered 2017-10-24: 1 g via INTRAVENOUS
  Administered 2017-10-25: 0 g via INTRAVENOUS
  Administered 2017-10-25: 1 g via INTRAVENOUS
  Filled 2017-10-24 (×2): qty 50

## 2017-10-24 MED ORDER — INSULIN LISPRO 100 UNIT/ML INJECTION SSIP - CITY
2.00 [IU] | Freq: Four times a day (QID) | SUBCUTANEOUS | Status: DC
Start: 2017-10-24 — End: 2017-10-28
  Administered 2017-10-24 – 2017-10-28 (×15): 0 [IU] via SUBCUTANEOUS
  Filled 2017-10-24: qty 300

## 2017-10-24 MED ORDER — PROPOFOL 10 MG/ML IV BOLUS
INJECTION | Freq: Once | INTRAVENOUS | Status: DC | PRN
Start: 2017-10-24 — End: 2017-10-24
  Administered 2017-10-24: 200 mg via INTRAVENOUS

## 2017-10-24 MED ORDER — HYDRALAZINE 20 MG/ML INJECTION SOLUTION
Freq: Once | INTRAMUSCULAR | Status: DC | PRN
Start: 2017-10-24 — End: 2017-10-24
  Administered 2017-10-24 (×3): 2 mg via INTRAVENOUS

## 2017-10-24 MED ORDER — DEXTROSE 50 % IN WATER (D50W) INTRAVENOUS SYRINGE
12.50 g | INJECTION | INTRAVENOUS | Status: DC | PRN
Start: 2017-10-24 — End: 2017-10-28

## 2017-10-24 MED ORDER — LACTATED RINGERS INTRAVENOUS SOLUTION
INTRAVENOUS | Status: DC
Start: 2017-10-24 — End: 2017-10-24

## 2017-10-24 MED ORDER — ROCURONIUM 10 MG/ML INTRAVENOUS SOLUTION
Freq: Once | INTRAVENOUS | Status: DC | PRN
Start: 2017-10-24 — End: 2017-10-24
  Administered 2017-10-24: 30 mg via INTRAVENOUS
  Administered 2017-10-24: 50 mg via INTRAVENOUS

## 2017-10-24 MED ORDER — DOCUSATE SODIUM 100 MG CAPSULE
100.00 mg | ORAL_CAPSULE | Freq: Two times a day (BID) | ORAL | Status: DC
Start: 2017-10-25 — End: 2017-10-28
  Administered 2017-10-25 – 2017-10-28 (×5): 100 mg via ORAL
  Filled 2017-10-24 (×8): qty 1

## 2017-10-24 MED ORDER — NEOSTIGMINE METHYLSULFATE 1 MG/ML INTRAVENOUS SOLUTION
Freq: Once | INTRAVENOUS | Status: DC | PRN
Start: 2017-10-24 — End: 2017-10-24
  Administered 2017-10-24: 4 mg via INTRAVENOUS

## 2017-10-24 MED ORDER — AMLODIPINE 5 MG TABLET
2.5000 mg | ORAL_TABLET | Freq: Every day | ORAL | Status: DC
Start: 2017-10-25 — End: 2017-10-27
  Administered 2017-10-25 – 2017-10-27 (×3): 2.5 mg via ORAL
  Filled 2017-10-24 (×4): qty 1

## 2017-10-24 MED ORDER — CYCLOBENZAPRINE 10 MG TABLET
10.00 mg | ORAL_TABLET | ORAL | Status: AC
Start: 2017-10-24 — End: 2017-10-24
  Administered 2017-10-24: 10 mg via ORAL
  Filled 2017-10-24: qty 1

## 2017-10-24 MED ORDER — PROCHLORPERAZINE EDISYLATE 10 MG/2 ML (5 MG/ML) INJECTION SOLUTION
10.0000 mg | Freq: Once | INTRAMUSCULAR | Status: DC | PRN
Start: 2017-10-24 — End: 2017-10-24

## 2017-10-24 MED ORDER — BUPIVACAINE 0.25% LOCAL INFILTRATION
270.0000 mL | Status: DC
Start: 2017-10-24 — End: 2017-10-28
  Administered 2017-10-24: 270 mL
  Filled 2017-10-24: qty 270

## 2017-10-24 MED ORDER — MIDAZOLAM 1 MG/ML INJECTION SOLUTION
Freq: Once | INTRAMUSCULAR | Status: DC | PRN
Start: 2017-10-24 — End: 2017-10-24
  Administered 2017-10-24: 2 mg via INTRAVENOUS

## 2017-10-24 MED ORDER — HYDROCHLOROTHIAZIDE 25 MG TABLET
12.5000 mg | ORAL_TABLET | Freq: Every day | ORAL | Status: DC
Start: 2017-10-25 — End: 2017-10-24

## 2017-10-24 MED ORDER — IPRATROPIUM BROMIDE 0.02 % SOLUTION FOR INHALATION
0.5000 mg | Freq: Once | RESPIRATORY_TRACT | Status: DC | PRN
Start: 2017-10-24 — End: 2017-10-24

## 2017-10-24 MED ORDER — ACETAMINOPHEN 1,000 MG/100 ML (10 MG/ML) INTRAVENOUS SOLUTION
Freq: Once | INTRAVENOUS | Status: DC | PRN
Start: 2017-10-24 — End: 2017-10-24
  Administered 2017-10-24: 1000 mg via INTRAVENOUS

## 2017-10-24 MED ORDER — FENTANYL (PF) 50 MCG/ML INJECTION SOLUTION
Freq: Once | INTRAMUSCULAR | Status: DC | PRN
Start: 2017-10-24 — End: 2017-10-24
  Administered 2017-10-24: 100 ug via INTRAVENOUS

## 2017-10-24 MED ORDER — ESMOLOL 100 MG/10 ML (10 MG/ML) INTRAVENOUS SOLUTION
Freq: Once | INTRAVENOUS | Status: DC | PRN
Start: 2017-10-24 — End: 2017-10-24
  Administered 2017-10-24: 20 mg via INTRAVENOUS

## 2017-10-24 MED ORDER — OXYCODONE-ACETAMINOPHEN 5 MG-325 MG TABLET
1.00 | ORAL_TABLET | ORAL | Status: DC | PRN
Start: 2017-10-24 — End: 2017-10-28
  Administered 2017-10-27 – 2017-10-28 (×3): 1 via ORAL
  Filled 2017-10-24 (×3): qty 1

## 2017-10-24 MED ORDER — GLYCOPYRROLATE 0.2 MG/ML INJECTION SOLUTION
Freq: Once | INTRAMUSCULAR | Status: DC | PRN
Start: 2017-10-24 — End: 2017-10-24
  Administered 2017-10-24: 0.6 mg via INTRAVENOUS

## 2017-10-24 MED ORDER — BUPIVACAINE (PF) 0.25 % (2.5 MG/ML) INJECTION SOLUTION
20.0000 mL | Freq: Once | INTRAMUSCULAR | Status: DC
Start: 2017-10-24 — End: 2017-10-24
  Administered 2017-10-24: 0 mL via INTRAMUSCULAR
  Filled 2017-10-24: qty 30

## 2017-10-24 MED ORDER — DEXAMETHASONE SODIUM PHOSPHATE 4 MG/ML INJECTION SOLUTION
Freq: Once | INTRAMUSCULAR | Status: DC | PRN
Start: 2017-10-24 — End: 2017-10-24
  Administered 2017-10-24: 8 mg via INTRAVENOUS

## 2017-10-24 MED ORDER — HYDROMORPHONE 2 MG/ML INJECTION SYRINGE
INJECTION | Freq: Once | INTRAMUSCULAR | Status: DC | PRN
Start: 2017-10-24 — End: 2017-10-24
  Administered 2017-10-24 (×2): 1 mg via INTRAVENOUS

## 2017-10-24 MED ORDER — PRAVASTATIN 10 MG TABLET
10.00 mg | ORAL_TABLET | Freq: Every evening | ORAL | Status: DC
Start: 2017-10-24 — End: 2017-10-25
  Administered 2017-10-24 – 2017-10-25 (×2): 0 mg via ORAL
  Filled 2017-10-24 (×2): qty 1

## 2017-10-24 MED ORDER — LACTATED RINGERS INTRAVENOUS SOLUTION
INTRAVENOUS | Status: DC
Start: 2017-10-24 — End: 2017-10-26

## 2017-10-24 MED ORDER — HYDROMORPHONE 2 MG/ML INJECTION SOLUTION
0.5000 mg | INTRAMUSCULAR | Status: DC | PRN
Start: 2017-10-24 — End: 2017-10-24
  Administered 2017-10-24 (×2): 0.5 mg via INTRAVENOUS
  Filled 2017-10-24: qty 1

## 2017-10-24 MED ORDER — ONDANSETRON HCL (PF) 4 MG/2 ML INJECTION SOLUTION
Freq: Once | INTRAMUSCULAR | Status: DC | PRN
Start: 2017-10-24 — End: 2017-10-24
  Administered 2017-10-24: 4 mg via INTRAVENOUS

## 2017-10-24 MED ORDER — PROCHLORPERAZINE EDISYLATE 10 MG/2 ML (5 MG/ML) INJECTION SOLUTION
10.00 mg | Freq: Four times a day (QID) | INTRAMUSCULAR | Status: DC | PRN
Start: 2017-10-24 — End: 2017-10-28
  Filled 2017-10-24 (×5): qty 2

## 2017-10-24 MED ORDER — LIDOCAINE (PF) 100 MG/5 ML (2 %) INTRAVENOUS SYRINGE
INJECTION | Freq: Once | INTRAVENOUS | Status: DC | PRN
Start: 2017-10-24 — End: 2017-10-24
  Administered 2017-10-24 (×2): 100 mg via INTRAVENOUS

## 2017-10-24 MED ORDER — OXYCODONE-ACETAMINOPHEN 5 MG-325 MG TABLET
2.00 | ORAL_TABLET | ORAL | Status: DC | PRN
Start: 2017-10-24 — End: 2017-10-28
  Administered 2017-10-24 – 2017-10-26 (×4): 2 via ORAL
  Filled 2017-10-24 (×6): qty 2

## 2017-10-24 MED ORDER — MORPHINE 4 MG/ML INTRAVENOUS SOLUTION
2.00 mg | INTRAVENOUS | Status: DC | PRN
Start: 2017-10-24 — End: 2017-10-28
  Administered 2017-10-24 – 2017-10-26 (×3): 2 mg via INTRAVENOUS
  Filled 2017-10-24 (×3): qty 1

## 2017-10-24 MED ORDER — ONDANSETRON HCL (PF) 4 MG/2 ML INJECTION SOLUTION
4.0000 mg | Freq: Once | INTRAMUSCULAR | Status: DC | PRN
Start: 2017-10-24 — End: 2017-10-24

## 2017-10-24 MED ORDER — ALBUTEROL SULFATE CONCENTRATE 2.5 MG/0.5 ML SOLUTION FOR NEBULIZATION
5.0000 mg | INHALATION_SOLUTION | Freq: Once | RESPIRATORY_TRACT | Status: DC | PRN
Start: 2017-10-24 — End: 2017-10-24

## 2017-10-24 MED ADMIN — ceFAZolin 2 gram/50 mL in dextrose (iso-osmotic) intravenous piggyback: INTRAVENOUS | @ 12:00:00

## 2017-10-24 MED ADMIN — dextrose 10 % in water (D10W) intravenous solution: INTRAVENOUS | @ 20:00:00 | NDC 00338002303

## 2017-10-24 MED ADMIN — zinc oxide-cod liver oil 40 % topical paste: INTRAVENOUS | @ 14:00:00 | NDC 7430000071

## 2017-10-24 MED ADMIN — glycopyrrolate 0.2 mg/mL injection solution: ORAL | @ 23:00:00

## 2017-10-24 MED ADMIN — lidocaine 1 %-epinephrine 1:100,000 injection solution: @ 12:00:00

## 2017-10-24 SURGICAL SUPPLY — 79 items
AGENT HMST 5ML MATRIX FLSL (SEALANTS) ×1
APPL 70% ISPRP 2% CHG 26ML 13. 2X13.2IN CHLRPRP PREP DEHP-FR (WOUND CARE/ENTEROSTOMAL SUPPLY) ×1
APPL 70% ISPRP 2% CHG 26ML CHLRPRP HI-LT ORNG PREP STRL LF  DISP CLR (WOUND CARE SUPPLY) ×2 IMPLANT
APPL ENDOS 474MM BXTR MLBL TIP DEHP-FR STRL LF DISP (GI LAB SUPPLIES) ×1
APPLIER LIGACLIP 11.4IN 10MM ROT MLT 20 CLIP TI MED LRG INTERNAL CLIP VAS STRL LF  DISP ENDOS (SUTURE/WOUND CLOSURE) ×2 IMPLANT
APPLIER LIGAMAX LIGACLIP 11.4I N 10MM ANG JAW DIST TIP CLOSE (SUTURE/WOUND CLOSURE) ×1
CLIP SUT LPR TY VICRYL PDS ABS STRL LF (ENDOSCOPIC SUPPLIES) ×4 IMPLANT
CLIP SUTURE ENDO ABSORB XC200 6EA/BX LAPRA TY (INSTRUMENTS ENDOMECHANICAL) ×2
CLIP SUTURE ENDO ABSORB XC200_6EA/BX LAPRA TY (INSTRUMENTS ENDOMECHANICAL) ×2
CLOSURE SKIN STRIPS 1/2X4IN R1547 6/PK 50PK/BX (WOUND CARE/ENTEROSTOMAL SUPPLY) ×1
CONV USE ITEM 337902 - KIT SURG LRG STUP NONST DISP LF (CUSTOM TRAYS & PACK) ×2 IMPLANT
DEVICE SUT ENDO CLOSE 10MM TRO CAR ST SPRG LD BLNT STY ENDOS (INSTRUMENTS ENDOMECHANICAL) ×1
DEVICE SUT ENDO CLOSE 10MM TROCAR ST SPRG LD BLUNT STY ENDOS LF (ENDOSCOPIC SUPPLIES) ×2 IMPLANT
DISC USE ITEM 122805 - AGENT HMST 5ML MATRIX FLSL (SEALANTS) ×2 IMPLANT
DISC USE ITEM 163322 - SYRINGE LL 20ML LTX STRL MED (NEEDLES & SYRINGE SUPPLIES) ×2 IMPLANT
DISCONTINUED USE 102429 - APPL ENDOS 474MM BXTR MLBL TIP DEHP-FR STRL LF  DISP (GI LAB SUPPLIES) ×2 IMPLANT
DISCONTINUED USE 330324 - NEEDLE HYPO 22GA 1.5IN REG WL_SFGLD POLYPROP REG BVL LL HUB (NEEDLES & SYRINGE SUPPLIES) ×2 IMPLANT
DISCONTINUED USE ITEM 97879 - SUTURE 36586 VICRYL LIGAPK 54IN VIOL BRD REEL COAT ABS (SUTURE/WOUND CLOSURE) ×2 IMPLANT
DRAPE 2 INCS FILM ANTIMIC 23X1 7IN IOBN STRL SURG (PROTECTIVE PRODUCTS/GARMENTS) ×1
DRAPE 2 INCS FILM ANTIMIC 23X17IN IOBN STRL SURG (PROTECTIVE PRODUCTS/GARMENTS) ×2 IMPLANT
DRAPE ENDOSCP ABS IMPRV REINF ARMBRD CVR CORD HOLD TAB 38.5 (PROTECTIVE PRODUCTS/GARMENTS) ×1
DRAPE ENDOSCP ABS IMPRV REINF ARMBRD CVR CORD HOLD TAB 38.5X16IN 11IN 10IN CNVRT LF  STRL SURG (PROTECTIVE PRODUCTS/GARMENTS) ×2 IMPLANT
DRUG DEL ONQ PNBSTR SLSKR 2.5I N CATH EXPD KT DEHP SYSTEM (ANETHESIA SUPPLIES) ×1
DRUG DEL ONQ PNBSTR SLSKR 2.5IN PUMP KIT CATH DEHP SYSTEM STRL (ANETHESIA SUPPLIES) ×2 IMPLANT
GARMENT COMPRESS MED CALF CENT AURA NYL VASOGRAD LTWT BRTHBL (ORTHOPEDICS (NOT IMPLANTS)) ×1
GARMENT COMPRESS MED CALF CENTAURA NYL VASOGRAD LTWT BRTHBL SEQ FIL BLU 18- IN (ORTHOPEDICS (NOT IMPLANTS)) ×2 IMPLANT
GOWN SURG XL AAMI L4 IMPRV REI NF BRTHBL STRL LF DISP CNVRT (PROTECTIVE PRODUCTS/GARMENTS) ×9 IMPLANT
HEMOSTAT ABS 14X2IN FLXB SHR W V SRGCL STRL DISP (WOUND CARE/ENTEROSTOMAL SUPPLY) ×3
HEMOSTAT ABS 14X2IN FLXB SHR W_V SRGCL STRL DISP (WOUND CARE SUPPLY) ×6 IMPLANT
IRR SUCT STRKFL2 TIP STRL LF  DISP (IRR) ×2 IMPLANT
IRR SUCT STRKFL2 TIP STRL LF DISP (IRR) ×1
KIT PAIN PUMP 4ML/HR ONQ I-FLO W DEHP CSTM NONNARCOTIC ELSTMR (ANETHESIA SUPPLIES) ×3 IMPLANT
NEEDLE HYPO  18GA 1.5IN REG WL SFGLD POLYPROP REG BVL LL SHIELD MECH DEHP-FR IM PNK STRL LF  DISP (NEEDLES & SYRINGE SUPPLIES) ×2 IMPLANT
NEEDLE HYPO 18GA 1.5IN REG WL SFGLD POLYPROP LL HUB SHIELD (NEEDLES & SYRINGE SUPPLIES) ×1
NEEDLE HYPO 22GA 1.5IN REG WL SFGLD POLYPROP REG BVL LL HUB (NEEDLES & SYRINGE SUPPLIES) ×1
PACK LRG SET UP DYNJ906636 CS/2 (CUSTOM TRAYS & PACK) ×1
PACK LRG SET UP_DYNJ906636A CS/2 (CUSTOM TRAYS & PACK) ×1
RELOAD STPLR MED CURVE 45MM TRI-STPL 2 VAS STRL LF  TAN (ENDOSCOPIC SUPPLIES) ×2 IMPLANT
SEALDIVD LAPSCP 37CM LIGASURE 350D 18.5MM MARYLAND CURVE JAW (INSTRUMENTS) ×1
SEALDIVD LAPSCP 37CM LIGASURE 350D 18.5MM MARYLAND CURVE JAW NANO COAT 20.3MM VLAB FT10 LS10 (SURGICAL INSTRUMENTS) ×2 IMPLANT
SOL ANFG DFGR ISOPRPNL PAD OVAL BTL NABRSV ADH STRL LF  DISP (KITS & TRAYS (DISPOSABLE)) ×2 IMPLANT
SOL IRRG 0.9% NACL 500ML PLASTIC PR BTL ISTNC N-PYRG STRL LF (SOLUTIONS) ×2 IMPLANT
SOLUTION ANTI FOG W/SPONGE 280101 DEFOG ENDOMATE 20EA/CS (KITS & TRAYS (DISPOSABLE)) ×1
SOLUTION IRRG H2O 500CC 2F7113 (SOLUTIONS) ×1
SOLUTION IRRG NS 500CC 2F7123 18/CS (SOLUTIONS) ×1
SPONGE GAUZE STRL 4 X 4IN TUB 6939 1280/CS (WOUND CARE/ENTEROSTOMAL SUPPLY) ×1
SPONGE GAUZE STRL 4 X 4IN TUB_6939 1280/CS (WOUND CARE SUPPLY) ×2 IMPLANT
SPONGE LAP 18 X 18IN STRL AL1818 5EA/PK 40PK/CS (WOUND CARE/ENTEROSTOMAL SUPPLY)
SPONGE LAP 18 X 18IN STRL_AL1818 5EA/PK 40PK/CS (WOUND CARE/ENTEROSTOMAL SUPPLY)
SPONGE LAP 18X18IN STRL (WOUND CARE SUPPLY) IMPLANT
STAPLER ENDO GIA UNIV 12MM EGIAUSTND 3EA/BX (INSTRUMENTS ENDOMECHANICAL) ×1
STAPLER ENDO RELOAD CVD 45MM SIG45CTAVM MED 6EA/BX (INSTRUMENTS ENDOMECHANICAL) ×1
STAPLER INTERNAL 16CMX4MM PVC STD UNIV TISS STRL LF  DISP EGIA ENDOS 12MM (ENDOSCOPIC SUPPLIES) ×2 IMPLANT
STRIP 4X.5IN STRSTRP PLSTR REINF SKNCLS WHT STRL LF (WOUND CARE SUPPLY) ×2 IMPLANT
SUTURE 1 TP-1 PDS2 96IN VIOL M ONOF LOOP ABS (SUTURE/WOUND CLOSURE) ×1
SUTURE 1 TP-1 PDS2 96IN VIOL MONOF LOOP ABS (SUTURE/WOUND CLOSURE) ×2 IMPLANT
SUTURE 3-0 CT1 VICRYL 27IN VIO L BRD COAT ABS (SUTURE/WOUND CLOSURE) ×1
SUTURE 3-0 CT1 VICRYL 27IN VIOL BRD COAT ABS (SUTURE/WOUND CLOSURE) ×2 IMPLANT
SUTURE 3-0 VICRYL LIGAPK 54IN VIOL BRD REEL COAT ABS (SUTURE/WOUND CLOSURE) ×1
SUTURE SILK 0 PERMAHAND 30IN B LK BRD TIE 6 STRN PCUT NONAB (SUTURE/WOUND CLOSURE) ×1
SUTURE SILK 0 PERMAHAND 30IN BLK BRD TIE 6 STRN PCUT NONAB (SUTURE/WOUND CLOSURE) ×2 IMPLANT
SYRINGE LL 10ML LF  STRL GRAD N-PYRG DEHP-FR PVC FREE MED DISP (NEEDLES & SYRINGE SUPPLIES) ×2 IMPLANT
SYRINGE LL 10ML LF STRL MED D ISP (NEEDLES & SYRINGE SUPPLIES) ×1
SYRINGE LL 20ML LTX STRL MED (NEEDLES & SYRINGE SUPPLIES) ×1
SYSTEM GELPORT HAND ACCESS C8XX2 (INSTRUMENTS ENDOMECHANICAL) ×1
SYSTEM GELPORT HAND ACCESS_C8XX2 (ENDOSCOPIC SUPPLIES) ×2 IMPLANT
TRAY CATH 16FR LUBRISIL FOLEY_ADV DRAIN BAG ANTIREFLUX (CATHETERS) ×2 IMPLANT
TRAY FOLEY 16FR CENTER ENTRY 948316 10EA/CS (CATHETERS) ×1
TROCAR BLADELESS 11MM STD NONB11STF VERSAPORT 6EA/BX (INSTRUMENTS ENDOMECHANICAL) ×1
TROCAR BLADELESS 12MM 6EA/BX DISP (INSTRUMENTS ENDOMECHANICAL) ×1
TROCAR BLADELESS 5MM NONB5STF 6EA/BX DISP (INSTRUMENTS ENDOMECHANICAL) ×1
TROCAR LAPSCP STD 100MM 11MM VERSAONE FIX CANN BLDLS DLPHN NOSE TIP STRL LF  DISP (ENDOSCOPIC SUPPLIES) ×2 IMPLANT
TROCAR LAPSCP STD 100MM 12MM VERSAONE FIX CANN BLDLS DLPHN NOSE TIP STRL LF  DISP (ENDOSCOPIC SUPPLIES) ×2 IMPLANT
TROCAR LAPSCP STD 100MM 12MM VERSAONE THREAD ANCH BLUNT TIP STRL LF  DISP CLR (ENDOSCOPIC SUPPLIES) ×2 IMPLANT
TROCAR LAPSCP STD 100MM 5MM VERSAONE FIX CANN BLDLS DLPHN NOSE TIP STRL LF  DISP (ENDOSCOPIC SUPPLIES) ×2 IMPLANT
TROCAR VERSAONE BLUNT 12MM OB BPT12STS (INSTRUMENTS ENDOMECHANICAL) ×1
TUBE PNEUMO HEATED BX/10 0620040690 (Connecting Tubes/Misc) ×1
TUBING INSFL PNEUMOSURE THRMPLST 12.52X5.83IN C19.84LB 150VA 18.7IN 50104F HEAT HI FLOW RATE SET (Connecting Tubes/Misc) ×2 IMPLANT
WATER STRL 500ML PLASTIC PR BTL LF (SOLUTIONS) ×2 IMPLANT

## 2017-10-24 NOTE — Nurses Notes (Signed)
Patient complaining of a mild amount of nausea, not much of an appetite for dinner tray. Offered patient a sugar free popsicle to which she is agreeable and tolerating well. Complains of dry mouth as well, given mouth swabs and moisturizer.

## 2017-10-24 NOTE — Anesthesia Preprocedure Evaluation (Signed)
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            Dental           (+) missing             Pulmonary    Breath sounds clear to auscultation  (-) no rhonchi, no decreased breath sounds, no wheezes, no rales and no stridor     Cardiovascular    Rhythm: regular  Rate: Normal  (-) no friction rub, carotid bruit is not present, no peripheral edema and no murmur     Other findings            Plan  Planned anesthesia type: general    ASA 3     Intravenous induction     Anesthetic plan and risks discussed with patient.     Anesthesia issues/risks discussed are: Dental Injuries, PONV, Sore Throat, Difficult Airway, Blood Loss, Aspiration and Post-op Intubation/Ventilation.        Patient's NPO status is appropriate for Anesthesia.           Plan discussed with CRNA.

## 2017-10-24 NOTE — Anesthesia Procedure Notes (Signed)
Arterial Line Procedure   Date/Time: 10/24/2017 12:55 PM    Consent:     Consent given by:  Patient    Risks discussed:  Bleeding, infection, nerve damage and pain  Universal protocol:     Patient identity confirmed:  Arm band  Pre-procedure details:     Preparation: Preprocedure hand washing was performed; sterile field was maintained     Skin Prep used: Chlorhexidine gluconate  Anesthesia (see MAR for exact dosages):   A in length,  Placed on the right  radial artery  using guidewire With  number of attempts:3.Secured with: DERMABOND, tape and transparent dressing   MEDICATIONS:     Post-procedure details:    Patient tolerance of procedure:  Tolerated well, no immediate complications blood withdrawn easily, flushes easily and good waveform  Complications:none  Performed By:  Anesthesiologist: Despina Pole, MD

## 2017-10-24 NOTE — OR Surgeon (Signed)
OPERATIVE NOTE        PATIENT NAME:  Martrice Apt  MRN:  [id number]  DOB:  Aug 07, 1966  DATE OF SERVICE:  10/24/2017      PREOPERATIVE DIAGNOSES:  left renal mass    POSTOPERATIVE DIAGNOSES:    left renal mass    NAME OF PROCEDURES:  left hand-assisted laparoscopic partial nephrectomy.     SURGEON:  Dr Ruta Hinds  ASSISTANT:  Dr. Kathline Magic    ANESTHESIA:  General.    OPERATIVE FINDINGS:  3.5 cm left renal mass    SPECIMENS:  1. Fat overlying renal mass  2. Left renal mass    IV Fluids:  1500 ml    ESTIMATED BLOOD LOSS:  010 ml    COMPLICATIONS:  None    CONDITION:  Stable    DRAINS: A 16 French Foley catheter.     DISPOSITION:   PACU - hemodynamically stable.  INDICATIONS: [name] is a very pleasant 52 y.o. female who was incidentally found to have a renal mass. The patient was seen in my office. We reviewed the films and the results and discussed the possible etiologies. We discussed the management options including observation and active surveillance renal biopsy, partial or radical nephrectomy, tumor ablation. After discussion of all of these risks, benefits and alternatives, we decided to have the patient undergo a hand-assisted left-sided laparoscopic partial nephrectomy. A discussion was had with the patient about all of the risks and benefits including the risk of bleeding, infection, damage to bowel, liver and other adjacent organs, renal failure, hernia and need for other procedure, and the patient understands all of this and wishes to go forth with the surgery. The patient comes in today for the procedure.     DESCRIPTION OF PROCEDURE: The patient was brought to the operating room and anesthesia was induced. The patient was given antibiotics preoperatively for infection prophylaxis. Sequential compression devices were placed on the patient's lower extremities and kept on during the entire procedure.  A 16 French Foley catheter was inserted with ease under sterile technique. The patient was then  placed in a modified flank position with the left side up. All pressure points were padded during this case. The patient's up sided arm was then brought over to the contralateral side and again was carefully padded with towels, blankets and pillows. The patient was then secured to the bed using 3 inch tape and the entire abdomen was prepped and draped in standard surgical fashion. I reviewed the CT scan of the abdomen and pelvis again to confirm the location of the renal mass. This site had been marked preoperatively. A surgical pause was then performed to confirm the identity of the patient and the entire surgical team agreed to the procedure.   A 7.5 centimeter incision was then made in the left lower quadrant. This was done sharply using the scalpel blade and dissected further down to the level of the peritoneum using electrocautery. Care was taken to ensure that there was no evidence of any active bleeding seen. The peritoneum was then grasped and  entered sharply using Metzenbaum scissors and the peritoneum was opened up. This was then divided until it was able to accommodate one hands-breadth. The left hand was inserted with ease. The liver was palpated and the mesentery was palpated.  The hand port was then inserted with ease through the incision. It was palpated to ensure that the bowel was not caught in the port. Once this was confirmed, a small  12 millimeter port was inserted lateral and inferior to the hand port. The abdomen was insufflated with CO2 to a pressure of 15.  An 11 mm port was placed hands breath superior to the hand port for use as a camera port.  A 5 millimeter port was then placed cephalad from the camera port. This was done using the nonbladed trocar. Once inside, the white line of Toldt was identified and this was taken down carefully using the Harmonic scalpel.   At this point, the peritoneum reflection off of the kidney was taken down. The hilum was identified. Using blunt dissection  and the harmonic scalpel, the renal artery and vein were encountered and carefully freed up to accommodate a stapler or clamp if necessary.   The kidney was then dissected free from its attachments to perinephric fat until was completely mobilized and the tumor was completely exposed.  The kidney was noticed to have inflammatory rind surrounding it and the rind overlying the tumor was sent as a specimen and labeled fat overlying tumor.  The renal capsule surrounding the tumor was divided with a heat source circumferentially the tumor was bluntly dissected free from the kidney.  Pressure was held on the resection site while the tumor was removed and passed off as a permanent specimen.  Two rolled pieces of Surgicel were placed in the resection bed.  A 3rd Surgicel was added laterally and they were secured in place with Vicryl suture through the capsule and secured with clips.  After taking the pneumoperitoneum and reinflate a the tumor bed was noted to be hemostatic.  FloSeal was placed over the resection site and perinephric fat was replaced over the kidney as well.     The bed was then rotated back to neutral to allow the bowel to return to its normal position.   All the ports were then removed under direct vision and the hand port was removed. Palpation revealed no evidence of  any damage. A catheter was placed between the transversalis fascia and the rectus muscles to infuse Marcaine using the on Q pain ball.  All incisions were infused subcutaneously with Marcaine as well.  The fascia was then reapproximated using #1 double stranded PDS in a running fashion.  All the skin incisions were then closed using 4-0 Monocryl and skin glue. The patient was returned to the supine position, was awakened from anesthesia.  She was extubated and brought to the recovery room in good condition. At the end of the case, all instrument, sponge and needle counts were correct.  Dr. Karle Barr assisted with all aspects of the surgery  including exposure, dissection, removal, and closure.  There were no other qualified assistants available.

## 2017-10-24 NOTE — Progress Notes (Signed)
Notified of consult. Brooke Dare, APRN has been assigned to this case.

## 2017-10-24 NOTE — Anesthesia Procedure Notes (Signed)
Airway Note  Date/Time: 10/24/2017 11:25 AM  Urgency: elective    Airway not difficult  General Information and Staff   Patient location during procedure: OR  CRNA: Chyrl Civatte, CRNA    Performed: CRNAs     Indications and Patient Condition  Indications for airway management: anesthesia  Spontaneous ventilation: present  Sedation level: deep  Preoxygenated: yes  Patient position: sniffing  Mask difficulty assessment: 1 - vent by mask    Final Airway Details  Final airway type: endotracheal airway        Successful airway: ETT and cuffed  Cuffed: yes   Successful intubation technique: direct laryngoscopy  Facilitating devices/methods: intubating stylet        Endotracheal tube insertion site: oral  Blade: Macintosh  Blade size: #4  Airway size (mm): 7.5  Cormack-Lehane Classification: grade IIa - partial view of glottis  Placement verified by: chest auscultation   Marked at 24 and right  Measured from: lips  Secured with: Tape  Number of attempts at approach: 1

## 2017-10-24 NOTE — Nurses Notes (Signed)
Pt arrived to unit via bed accompanied by 2 PACU RNs. Pt drowsy but opens eyes spontaneously. VS initiated upon pt arrival to unit.

## 2017-10-24 NOTE — Anesthesia Transfer of Care (Signed)
ANESTHESIA TRANSFER OF CARE   [name] is a 52 y.o. ,female, Weight: 129.3 kg (285 lb)   had Procedure(s):  NEPHRECTOMY PARTIAL LAPAROSCOPIC HAND ASSISTED  performed  10/24/17   Primary Service: Ruta Hinds, MD    Past Medical History:   Diagnosis Date   . Anxiety    . Arthritis    . Cellulitis 10/24/14    left foot   . Depression    . Diabetes mellitus (CMS [place])    . Essential hypertension    . Fatty liver    . Headache(784.0)    . Hyperlipidemia    . Irritable bowel syndrome    . Macular edema    . Neck problem     good ROM   . Obesity    . Type 2 diabetes mellitus (CMS [facility])    . White coat hypertension       Allergy History as of 10/24/17     HYDROCODONE-ACETAMINOPHEN       Noted Status Severity Type Reaction    10/24/14 1031 Oretha Milch, RN 10/24/14 Active Low  Itching              I completed my transfer of care / handoff to the receiving personnel during which we discussed:  Access, Airway, All key/critical aspects of case discussed, Analgesia, Antibiotics, Expectation of post procedure, Fluids/Product, Gave opportunity for questions and acknowledgement of understanding, Labs and PMHx    Post Location: PACU                                                                Last OR Temp: Temperature: 36.2 C (97.2 F)  ABG:  POTASSIUM   Date Value Ref Range Status   10/08/2017 5.4 (H) 3.4 - 5.1 mmol/L Final   12/07/2014 4.1 3.5 - 5.0 mmol/L Final     KETONES   Date Value Ref Range Status   10/24/2017 Negative Negative mg/dL Final     KETONES,URINE   Date Value Ref Range Status   09/02/2012 NEGATIVE NEGATIVE mg/dL Final     CALCIUM   Date Value Ref Range Status   10/08/2017 9.4 8.6 - 10.3 mg/dL Final   12/07/2014 9.3 8.5 - 10.5 mg/dL Final     Airway:   Blood pressure (!) 142/71, pulse 90, temperature 36.2 C (97.2 F), resp. rate 13, height 1.702 m (5\' 7" ), weight 129.3 kg (285 lb), SpO2 96 %.

## 2017-10-24 NOTE — Anesthesia Procedure Notes (Signed)
Procedures

## 2017-10-24 NOTE — Nurses Notes (Signed)
Pt up to side of bed. Wants to ambulate in hall. 6 inch area of blood on left side of pt gown. Visualized pt abdomen, small area of blood dripping intermittently from left incision. Adhesive bandage applied. Dr. Kerin Perna aware.

## 2017-10-24 NOTE — Nurses Notes (Signed)
Report called to [facility] on Peds.     Pat Sires Martin[place], RN

## 2017-10-24 NOTE — Consults (Signed)
[facility]  [place], [place] [address]    Internal Medicine Initial Consult Note    [name]  Date of Admission:  10/24/2017   Date of Service: 10/24/2017   Date of Birth:  [birth year]    PCP: [name] Furbish, MD  Chief Complaint:  Left thigh twitching   Consulting Physician: [name] (Urology)  Reason for Consultation: Medical Management       HPI: [name] is a 52 y.o., White female with a history of type II DM, Hypertension, Hyperlipidemia, obesity, arthritis, and anxiety who underwent a laparoscopic partial nephrectomy today by Dr [name]. Pt had a 3.5cm left-sided renal mass that was dx after a 2-3 week hx of right-sided flank pain associated with nausea and radiation down her right hip. At that time her labs were also exhibiting worsening renal functioning that was otherwise unexplained.  I have been consulted for medical management of the patient during her post operative  recovery in the hospital.   The patient currently rates her pain 7/10 all incisional. She also has some left leg muscle spasms. Foley catheter draining concentrated yellow urine without hematuria. She denies nausea, vomiting, chest pain, shortness of breath, and passing flatus since surgery. She has tolerating clear liquids thus far.     Assessment/Recommendations:     1. S/p left Partial Nephrectomy, Laparoscopic  I agree with the current management   -Obtain post op CBC/BMP  -Encourage IS and ambulation]     2. Renal Injury  Est Cr Cl prior to surgery= 49.6, postoperatively 55.7  -Will continue to monitor  - avoid unnecessary nephrotoxins  -renally dose medications where appropriate     3. Type II DM:  BG 259 on admission  -Will hold home glipizide and start pt on low dose SSI for now  -Finger sticks ACHS, dextrose for hypoglycemia, diabetic diet     4. HTN  573U systolic postoperatively, will monitor for now  -cont home amlodipine and losartan, hold HCTZ pending urology recommendations     5.  Hyperlipidemia   -Cont home statin       Thank you for allowing [place] to participate in the care of [name]. Feel free to contact us with questions or concerns. Will continue to follow.     [name] Grills, APRN,FNP-BC     -----------------------------------------------------------------------    Patient Active Problem List    Diagnosis Date Noted   . Morbid obesity 10/24/2017     Priority: Low   . Left renal mass 10/09/2017   . Hyperkalemia 10/08/2017   . AKI (acute kidney injury) (CMS [place]) 10/08/2017   . Slow transit constipation 10/08/2017   . C. difficile diarrhea 12/02/2014   . Essential hypertension 11/30/2014   . Obesity 11/30/2014   . Cellulitis of foot, left 11/29/2014   . Type 2 diabetes mellitus (CMS [place]) 10/25/2014   . Diabetic foot infection (CMS [place]) 10/24/2014       Past Medical History:   Diagnosis Date   . Anxiety    . Arthritis    . Cellulitis 10/24/14    left foot   . Depression    . Diabetes mellitus (CMS [place])    . Essential hypertension    . Fatty liver    . Headache(784.0)    . Hyperlipidemia    . Irritable bowel syndrome    . Macular edema    . Neck problem     good ROM   . Obesity    .  Type 2 diabetes mellitus (CMS HCC)    . White coat hypertension            Past Surgical History:   Procedure Laterality Date   . ECTOPIC PREGNANCY SURGERY  1999   . HX CATARACT REMOVAL Bilateral    . HX CHOLECYSTECTOMY  09/10/96   . 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 HX PELVIC LAPAROSCOPY  05/24/98    Ectopic pregnancy   . HX TUBAL LIGATION  09/10/2012    Filshie Clips   . HYMENECTOMY             Medications Prior to Admission     Prescriptions    amLODIPine (NORVASC) 2.5 mg Oral Tablet    Take 2.5 mg by mouth Once a day    fluticasone (FLONASE) 50 mcg/actuation Nasal Spray, Suspension    1 Spray by Each Nostril route Once a day    glipiZIDE (GLUCOTROL) 5 mg Oral Tablet    Take 2.5 mg by mouth Twice a day before meals Take 30 minutes before meals    loperamide (IMODIUM) 2 mg Oral Capsule    Take 2 mg by mouth Every  4 hours as needed    loratadine (CLARITIN) 10 mg Oral Tablet    Take 10 mg by mouth Once per day as needed     losartan-hydrochlorothiazide (HYZAAR) 100-12.5 mg Oral Tablet    Take 1 Tab by mouth Once a day    magnesium oxide (MAG-OX) 400 mg (241.3 mg magnesium) Oral Tablet    Take 400 mg by mouth Twice daily    pravastatin (PRAVACHOL) 10 mg Oral Tablet    Take 10 mg by mouth Every evening          Current Facility-Administered Medications:  [START ON 10/25/2017] amLODIPine (NORVASC) tablet 2.5 mg Oral Daily   bupivacaine (MARCAINE) 0.25% local infiltration ON-Q pump 270 mL Local Infiltration Continuous   ceFAZolin (ANCEF) 1 g in iso-osmotic 50 mL premix IVPB 1 g Intravenous Q8H   SSIP insulin lispro (HUMALOG) 100 units/mL injection 2-12 Units Subcutaneous 4x/day AC   And      dextrose 50% (0.5 g/mL) injection - syringe 12.5 g Intravenous Q15 Min PRN   [START ON 10/25/2017] docusate sodium (COLACE) capsule 100 mg Oral 2x/day   LR premix infusion  Intravenous Continuous   morphine 4 mg/mL injection 2 mg Intravenous Q2H PRN   oxyCODONE-acetaminophen (PERCOCET) 5-325mg  per tablet 1 Tab Oral Q4H PRN   oxyCODONE-acetaminophen (PERCOCET) 5-325mg  per tablet 2 Tab Oral Q4H PRN   pravastatin (PRAVACHOL) tablet 10 mg Oral QPM       Allergies   Allergen Reactions   . Vicodin [Hydrocodone-Acetaminophen] Itching       Social History     Tobacco Use   . Smoking status: Never Smoker   . Smokeless tobacco: Never Used   Substance Use Topics   . Alcohol use: No       Family Medical History:     Problem Relation (Age of Onset)    Cancer Maternal Grandmother    Diabetes Father    Hypertension Maternal Uncle            ROS: other than ROS in HPI, all other systems negative       EXAM:  Temperature: 36.4 C (97.5 F)  Heart Rate: 85  BP (Non-Invasive): (!) 153/79  Respiratory Rate: 12  SpO2-1: 96 %  Pain Score (Numeric, Faces): 8  General: no acute distress,  sitting up in bed eating a popsicle  Eyes: Pupils equal and round, reactive to  light and accomodation. Anicteric  HEENT: Head atraumatic and normocephalic, MMM  Neck: No JVD or thyromegaly or lymphadenopathy   Lungs: No respiratory distress. Lungs CTAB. No w/r/r.   Cardiovascular: normal rate, regular rhythm, S1, S2 normal, no murmur appreciated  Abdomen: Obese, Soft, non-tender, non-distended, bowel sounds hypoactive, no hepatosplenomegaly, marcaine pump intact. Incisions well approximated    Extremities: muscle twitching localized to left thigh, intermittent. no cyanosis or edema. DP pulses 2+ and equal bilaterally.   Skin: Skin warm and dry. Incisions intact and well approximated    Neurologic: Alert, oriented, no focal deficit, CN II-XII grossly intact  Lymphatics: No lymphadenopathy   Psychiatric: Normal affect, behavior       Labs:    I have reviewed all lab results.  Lab Results for Last 24 Hours:  Results for orders placed or performed during the hospital encounter of 10/24/17 (from the past 24 hour(s))   URINALYSIS, MACROSCOPIC   Result Value Ref Range    COLOR Yellow Light Yellow, Straw, Yellow    APPEARANCE Clear Clear    PH 6.0 <8.0    LEUKOCYTES Negative Negative WBCs/uL    NITRITE Negative Negative    PROTEIN >= 500 (A) Negative mg/dL    GLUCOSE 50  (A) Negative mg/dL    KETONES Negative Negative mg/dL    UROBILINOGEN < 2.0 <=2.0 mg/dL    BILIRUBIN Negative Negative mg/dL    BLOOD Moderate (A) Negative mg/dL    SPECIFIC GRAVITY 1.012 <1.022   URINALYSIS, MICROSCOPIC   Result Value Ref Range    RBCS 10-20 (A) 0 - 2 /hpf    WBCS 0-2 0 - 2 /hpf    BACTERIA None None /hpf    SQUAMOUS EPITHELIAL None 0 - 2 /hpf    TRANSITIONAL EPITHELIAL 0-2 0 - 2 /hpf    RENAL EPITHELIAL 0-2 0 - 2 /hpf   TYPE AND SCREEN   Result Value Ref Range    UNITS ORDERED NOT STATED         ABO/RH(D) O POSITIVE     ANTIBODY SCREEN NEGATIVE     SPECIMEN EXPIRATION DATE 10/27/2017    POC FINGERSTICK GLUCOSE - [facility] (RESULTS)   Result Value Ref Range    GLUCOSE, POC 229 (H) 60 - 100 mg/dl   POC FINGERSTICK  GLUCOSE - [facility] (RESULTS)   Result Value Ref Range    GLUCOSE, POC 238 (H) 60 - 100 mg/dl       Imaging Studies:      EXAMINATION: MRI ABDOMEN WO IV CONTRAST     EXAM DATE/TIME:  10/08/2017 3:28 PM    CLINICAL INDICATION: kidney mass    COMPARISON: None.    TECHNIQUE: MRI of the abdomen was performed without contrast. Contrast  could not be given due to GFR less than 30.      FINDINGS:  Compared to noncontrast CT from earlier today.    There is a 2.4 x 3.5 cm heterogeneous appearing solid mass in the  interpolar region of the left kidney. On the noncontrast portion of this  exam, this is very suspicious for renal cell carcinoma. Note that contrast  could not be given due to poor renal function. No other renal abnormalities  are identified. There is no retroperitoneal lymphadenopathy. The liver,  spleen, pancreas and adrenal glands are unremarkable.    IMPRESSION:  3.5 cm mass in the interpolar region of the  left kidney. This  appears to be a solid mass and is very suspicious for renal cell carcinoma.  If renal function improves, follow-up study with contrast recommended.    ___________________________    EXAMINATION: CT ABDOMEN PELVIS WO IV CONTRAST     CLINICAL INDICATION: right flank pain    TECHNIQUE: Axial plane images were performed. Coronal and Sagittal  reformatted image were obtained.    COMPARISON: None.      LOWER CHEST  - LUNG BASES/DIAPHRAGM: Normal appearance.     UPPER ABDOMEN: Assessment limited by absence of iv contrast.  - LIVER: Normal size and morphology of the liver. There are no focal mass  lesions.  - GALLBLADDER: Post cholecystectomy.  - BILIARY TREE: Normal appearance.  - PANCREAS: Normal appearance.  - SPLEEN: Normal appearance.  - EPIGASTRIC LYMPH NODES: None enlarged    MID ABDOMEN: Assessment limited by absence of iv contrast.  - ADRENALS: Normal appearance.  - RIGHT KIDNEY: Normal appearance.   - LEFT KIDNEY: There is a 3 cm lesion in the mid aspect of left kidney  that  measures 28 Hounsfield units. This is indeterminate for solid versus cystic  lesion.   - RETROPERITONEAL LYMPH NODES: None enlarged  - RETROPERITONEUM: Normal appearance.  - LATERAL ABDOMINAL WALL (FLANK): Normal appearance.  - ANTERIOR ABDOMINAL WALL: Normal appearance.  - AORTA: Normal appearance.  - IVC: Normal appearance    GI TRACT: Assessment limited by absence of oral and iv contrast.  - STOMACH: Normal appearance.  - DUODENUM: Normal appearance.  - SMALL BOWEL:  Normal appearance.  - LARGE BOWEL:  Large amount of stool in the colon, greater than  physiologic amount. There is no bowel obstruction. Mild diverticulosis is  present.  - APPENDIX: normal appearance.  - RECTUM:  see below under pelvis.  - MESENTERIC VESSELS: No calcification.    - MESENTERY/PERITONEAL CAVITY/OMENTUM: Normal appearance.  - MESENTERIC LYMPH NODES: None enlarged    LOWER ABDOMEN/PELVIS: Assessment limited by absence of oral and iv  contrast.  - URINARY BLADDER: Normal appearance.  - REPRODUCTIVE ORGANS:  There is evidence of tubal ligation.  - PELVIC SOFT TISSUES: Normal appearance.  - RECTUM:  Normal appearance.  - PERI ANAL REGION: Normal appearance.  - PELVIC FLOOR: Normal appearance.  - PELVIC LYMPH NODES: Normal appearance.  - INGUINAL REGION/GROINS: Normal appearance.  - BONY PELVIS: Normal appearance.     SPINE: Normal appearance.     IMPRESSION:  1. No inflammatory process or mass lesions.   Normal appendix.  2. No stones or hydronephrosis.  3. There is a 3 cm indeterminate lesion in the left kidney that measures 28  Hounsfield units and could represent a complex cyst versus solid mass. This  requires additional imaging workup with MRI. If patient has acute renal  failure and cannot receive IV contrast, a noncontrast MRI should be  considered.

## 2017-10-24 NOTE — Nurses Notes (Signed)
Dr. Kerin Perna at bedside with patient and primary RN Larene Beach.

## 2017-10-24 NOTE — Interval H&P Note (Signed)
[facility]      H&P UPDATE FORM                                                                                  [name], [name], 52 y.o. female  Date of Admission:  10/24/2017  Date of Birth:  1966/08/29    10/24/2017    STOP: IF H&P IS GREATER THAN 30 DAYS FROM SURGICAL DAY COMPLETE NEW H&P IS REQUIRED.     H & P updated the day of the procedure.  1.  H&P completed within 30 days of surgical procedure  and has been reviewed within 24 hours of the surgery, the patient has been examined, and no change has occured in the patients condition since the H&P was completed.       Change in medications: No     No LMP recorded. Patient is perimenopausal.      Comments:     2.  Patient continues to be appropriate candidate for planned surgical procedure. YES    Ruta Hinds, MD

## 2017-10-25 ENCOUNTER — Encounter (HOSPITAL_BASED_OUTPATIENT_CLINIC_OR_DEPARTMENT_OTHER): Payer: Self-pay | Admitting: Urology

## 2017-10-25 LAB — BASIC METABOLIC PANEL
ANION GAP: 10 mmol/L (ref 3–11)
BUN/CREA RATIO: 13 (ref 6–22)
BUN: 28 mg/dL — ABNORMAL HIGH (ref 6–20)
CALCIUM: 8.7 mg/dL (ref 8.6–10.3)
CHLORIDE: 105 mmol/L (ref 101–111)
CO2 TOTAL: 20 mmol/L — ABNORMAL LOW (ref 22–32)
CREATININE: 2.17 mg/dL — ABNORMAL HIGH (ref 0.44–1.00)
ESTIMATED GFR: 24 mL/min/1.73mˆ2 — ABNORMAL LOW (ref >60)
GLUCOSE: 206 mg/dL — ABNORMAL HIGH (ref 70–110)
POTASSIUM: 4.9 mmol/L (ref 3.4–5.1)
SODIUM: 135 mmol/L — ABNORMAL LOW (ref 136–145)

## 2017-10-25 LAB — CBC WITH DIFF
BASOPHIL #: 0 x10ˆ3/uL (ref 0.00–0.10)
BASOPHIL %: 0 % (ref 0–3)
EOSINOPHIL #: 0 x10ˆ3/uL (ref 0.00–0.50)
EOSINOPHIL %: 0 % (ref 0–5)
HCT: 33.5 % — ABNORMAL LOW (ref 36.0–45.0)
HGB: 11.2 g/dL — ABNORMAL LOW (ref 12.0–15.5)
LYMPHOCYTE #: 0.7 x10ˆ3/uL — ABNORMAL LOW (ref 1.00–4.80)
LYMPHOCYTE %: 7 % — ABNORMAL LOW (ref 15–43)
MCH: 29.7 pg (ref 27.5–33.2)
MCHC: 33.5 g/dL (ref 32.0–36.0)
MCV: 88.8 fL (ref 82.0–97.0)
MONOCYTE #: 0.9 10*3/uL (ref 0.20–0.90)
MONOCYTE %: 8 % (ref 5–12)
MPV: 9.8 fL (ref 7.4–10.5)
NEUTROPHIL #: 8.7 x10ˆ3/uL — ABNORMAL HIGH (ref 1.50–6.50)
NEUTROPHIL %: 85 % — ABNORMAL HIGH (ref 43–76)
PLATELETS: 260 x10ˆ3/uL (ref 150–450)
RBC: 3.77 x10ˆ6/uL — ABNORMAL LOW (ref 4.00–5.10)
RDW: 14.1 % (ref 11.0–16.0)
WBC: 10.2 x10ˆ3/uL (ref 4.0–11.0)

## 2017-10-25 LAB — POC FINGERSTICK GLUCOSE - BMC/JMC (RESULTS)
GLUCOSE, POC: 192 mg/dl — ABNORMAL HIGH (ref 60–100)
GLUCOSE, POC: 197 mg/dl — ABNORMAL HIGH (ref 60–100)
GLUCOSE, POC: 154 mg/dl — ABNORMAL HIGH (ref 60–100)

## 2017-10-25 LAB — HISTORICAL SURGICAL PATHOLOGY SPECIMEN

## 2017-10-25 MED ORDER — DIPHENHYDRAMINE 25 MG CAPSULE
25.00 mg | ORAL_CAPSULE | Freq: Four times a day (QID) | ORAL | Status: DC | PRN
Start: 2017-10-25 — End: 2017-10-28
  Administered 2017-10-26: 25 mg via ORAL
  Filled 2017-10-25: qty 1

## 2017-10-25 MED ORDER — PRAVASTATIN 10 MG TABLET
10.00 mg | ORAL_TABLET | Freq: Every morning | ORAL | Status: DC
Start: 2017-10-26 — End: 2017-10-28
  Administered 2017-10-26 – 2017-10-28 (×3): 10 mg via ORAL
  Filled 2017-10-25 (×4): qty 1

## 2017-10-25 MED ORDER — GLIPIZIDE ER 2.5 MG TABLET, EXTENDED RELEASE 24 HR
2.50 mg | EXTENDED_RELEASE_TABLET | Freq: Two times a day (BID) | ORAL | Status: DC
Start: 2017-10-25 — End: 2017-10-25
  Administered 2017-10-25: 0 mg via ORAL
  Administered 2017-10-25: 2.5 mg via ORAL
  Filled 2017-10-25 (×3): qty 1

## 2017-10-25 MED ORDER — LOSARTAN 100 MG TABLET
100.0000 mg | ORAL_TABLET | Freq: Every day | ORAL | Status: DC
Start: 2017-10-25 — End: 2017-10-26
  Administered 2017-10-25 – 2017-10-26 (×2): 100 mg via ORAL
  Filled 2017-10-25 (×5): qty 1

## 2017-10-25 MED ORDER — GLIPIZIDE ER 5 MG TABLET, EXTENDED RELEASE 24 HR
5.00 mg | EXTENDED_RELEASE_TABLET | Freq: Every morning | ORAL | Status: DC
Start: 2017-10-26 — End: 2017-10-28
  Administered 2017-10-26 – 2017-10-28 (×3): 5 mg via ORAL
  Filled 2017-10-25 (×4): qty 1

## 2017-10-25 MED ORDER — HYDROCHLOROTHIAZIDE 25 MG TABLET
12.5000 mg | ORAL_TABLET | Freq: Every day | ORAL | Status: DC
Start: 2017-10-25 — End: 2017-10-26
  Administered 2017-10-25 – 2017-10-26 (×3): 12.5 mg via ORAL
  Filled 2017-10-25 (×2): qty 1

## 2017-10-25 MED ORDER — MAGNESIUM HYDROXIDE 400 MG/5 ML ORAL SUSPENSION
30.00 mL | Freq: Every day | ORAL | Status: DC | PRN
Start: 2017-10-25 — End: 2017-10-28

## 2017-10-25 MED ORDER — SODIUM CHLORIDE 0.9 % IV BOLUS
1000.0000 mL | INJECTION | Freq: Once | Status: AC
Start: 2017-10-25 — End: 2017-10-25
  Administered 2017-10-25: 1000 mL via INTRAVENOUS
  Administered 2017-10-25 (×2): 0 mL via INTRAVENOUS

## 2017-10-25 MED ADMIN — cloNIDine 0.1 mg/24 hr weekly transdermal patch: INTRAVENOUS | @ 04:00:00

## 2017-10-25 NOTE — Nurses Notes (Signed)
Pt up to the bathroom. Voided 350 cc clear dark yellow urine. Ambulated up and down hall. Tolerated well. Back to bed.  Physical assessment completed and VS obtained.  Plan of care reviewed with pt. Pt has requested her glipizide dosage be changed to 5mg  ER 1X daily in the morning (0800) as well as her pravastatin which is taken in morning as well. Per pt she has taken it 1x daily in am for last 6 mo. Hospitalist paged to make possible revision to [facility].

## 2017-10-25 NOTE — Nurses Notes (Signed)
Pt fsbs 231. Spoke with pt at about reasoning for humolog being ordered while hospitalized vs home oral diabetic medication. Pt expressed fear of glucose dropping with humalog and is refusing sub q insulin at this time. Pt instructed to notify nurse if she feels glucose is increasing. Pt stated understanding.   Sidonie Dickens, RN

## 2017-10-25 NOTE — Nurses Notes (Signed)
Up to bathroom. Voided 50cc yellow urine. Walking in room.  Comfortable at present.

## 2017-10-25 NOTE — Care Management Notes (Signed)
10/25/17 0936   Assessment Detail   Assessment Type Admission   Date of Care Management Update 10/25/17   Readmission   Is this a readmission? No   Social Work Animal nutritionist Status initial meeting   Anticipated Discharge Disposition Home   Patient/Family in [place] with Plan yes   Discharge Needs Assessment   Concerns To Be Addressed no discharge needs identified;denies needs/concerns at this time   Equipment Currently Used at Home none   Equipment Needed After Discharge none   Transportation Available car;family or friend will provide   Referral Information   Admission Type inpatient   Address Verified verified-no changes   Insurance Verified verified-no change   Source of Information Patient   Living Environment   Lives With spouse   Living Arrangements house   Quality of Family Relationships supportive   Able to Return to Prior Arrangements yes   [place] In Home yes     The patient lives with her husband and they can both drive.  She does not have any medical equipment and she uses the [facility] in [place].  Confirmed that Dr [name] is her PCP.  She plans on going home after discharge and her mother in law is going to be staying with her for a few weeks to help out.  Of note her mother in law is a retired Therapist, sports.  The patient denies any discharge needs at this time.  Please consult CM/SS if any discharge needs are identified.

## 2017-10-25 NOTE — Anesthesia Postprocedure Evaluation (Signed)
Anesthesia Post Op Evaluation    Patient: [name]  Procedure(s):  NEPHRECTOMY PARTIAL LAPAROSCOPIC HAND ASSISTED    Last Vitals:Temperature: 37.1 C (98.8 F) (10/25/17 0801)  Heart Rate: 100 (10/25/17 0801)  BP (Non-Invasive): (!) 168/93 (10/25/17 0801)  Respiratory Rate: 20 (10/25/17 0801)  SpO2-1: 95 % (10/25/17 0801)  Pain Score (Numeric, Faces): 7 (10/25/17 0346)    Patient location during evaluation: bedside   Post-procedure handoff checklist completed    Patient participation: complete - patient participated  Level of consciousness: awake and alert and responsive to verbal stimuli  Pain management: adequate  Airway patency: patent  Anesthetic complications: no  Cardiovascular status: acceptable  Respiratory status: acceptable  Hydration status: acceptable  Patient post-procedure temperature: Pt Normothermic   PONV Status: Absent  Comments: Up on edge of bed eating breakfast. Denies issues @ this time.

## 2017-10-25 NOTE — Progress Notes (Signed)
Urology Progress note           [name] is a 52 y.o. female POD #1 s/p L lap partial nephrectomy.  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  Patient has been able to ambulate with some difficulty and has tolerated PO, but has been belching and has not had flatus yet.  No nausea.  Appropriate incisional pain.    BP (!) 142/90   Pulse 100   Temp 36.7 C (98 F)   Resp 18   Ht 1.702 m (5\' 7" )   Wt (!) 136.2 kg (300 lb 3.2 oz)   SpO2 96%   BMI 47.02 kg/m     Labs reviewed.  CBC WNL, BMP with mild increase in SCr.    Low urine output     PE: Incision c/d/i, moderate ecchymosis of inferior lateral port site without drainage, fluctuance or erythema.  No labored breathing    A/P: POD #1 s/p L lap partial nx, making appropriate progress, low urine output  - Plan for 1L IVF bolus for low urine output  -Will check labs in AM  -Continue ambulation, SCD's, IS  -Likely d/c tomorrow afternoon if doing well     Ruta Hinds, MD

## 2017-10-25 NOTE — Nurses Notes (Signed)
Dr Kerin Perna present and examined patient. Bolus ordered and started.

## 2017-10-25 NOTE — Nurses Notes (Signed)
Vs stable. Medicated for pain per request. abd iscisions clean and dry.

## 2017-10-25 NOTE — Nurses Notes (Signed)
Foley removed at 0830 am. Tolerated well. Returned to bed. Medicated for pain per request.  Requesting po bp mdication.  Hospitalist paged.

## 2017-10-25 NOTE — Nurses Notes (Signed)
fsbs 197. Declined insulin.  Glipizide ordered and will give.  Up at bedside to eat lunch.  Denies need to void yet.  fannypack and tubing in place.

## 2017-10-25 NOTE — Care Plan (Signed)
Bolus infusing. Has not voided since noon.  Eating well. No nausea belching. Has not passed gas. Walking in room.  Family with.

## 2017-10-25 NOTE — Nurses Notes (Signed)
[place] nursing student's documentation.

## 2017-10-25 NOTE — Nurses Notes (Signed)
Ivs converted to saline lock. Assessment done. Sitting at bedside to eat breakfast. fsbs 192. Refused insulin.

## 2017-10-25 NOTE — Care Plan (Signed)
BPs running 150s/80s & 90s. Afebrile. IV fluids infusing. Foley draining minimal amount of cloudy yellow urine throughout night. Incisions to abdomen no longer leaking. Bandaid dry and intact. SCDs on. Sitting at side of the bed a few times, as well as standing at bedside. Medicated X1 with morphine for incisional pain and X2 with percocet. No Nausea/vomiting.

## 2017-10-25 NOTE — Nurses Notes (Signed)
NS bolus finished.  Pt saline locked at this time.

## 2017-10-25 NOTE — Progress Notes (Signed)
[facility]  [place], [place] [address]    IP PROGRESS NOTE - CONSULT    Jobe Igo, FNP-C      Rhae Hammock  Date of Admission:  10/24/2017  Date of Birth:  Jul 09, 1966  Date of Service:  10/25/2017    Chief Complaint: post-op abdominal discomfort    Subjective: sitting in bed in high-Fowler's position; above noted    Vital Signs:  Temp (24hrs) Max:37.1 C (98.8 F)      Temperature: 37.1 C (98.8 F)  BP (Non-Invasive): (!) 168/93  MAP (Non-Invasive): 109 mmHG  Heart Rate: 100  Respiratory Rate: 20  Pain Score (Numeric, Faces): 6  SpO2-1: 95 %    Current Medications:    Current Facility-Administered Medications:  amLODIPine (NORVASC) tablet 2.5 mg Oral Daily   bupivacaine (MARCAINE) 0.25% local infiltration ON-Q pump 270 mL Local Infiltration Continuous   SSIP insulin lispro (HUMALOG) 100 units/mL injection 2-12 Units Subcutaneous 4x/day AC   And      dextrose 50% (0.5 g/mL) injection - syringe 12.5 g Intravenous Q15 Min PRN   docusate sodium (COLACE) capsule 100 mg Oral 2x/day   glipiZIDE (GLUCOTROL XL) extended release tablet 2.5 mg Oral 2x/day   hydroCHLOROthiazide (HYDRODIURIL) tablet 12.5 mg Oral Daily   losartan (COZAAR) tablet 100 mg Oral Daily   LR premix infusion  Intravenous Continuous   morphine 4 mg/mL injection 2 mg Intravenous Q2H PRN   oxyCODONE-acetaminophen (PERCOCET) 5-325mg  per tablet 1 Tab Oral Q4H PRN   oxyCODONE-acetaminophen (PERCOCET) 5-325mg  per tablet 2 Tab Oral Q4H PRN   pravastatin (PRAVACHOL) tablet 10 mg Oral QPM   prochlorperazine (COMPAZINE) 5 mg/mL injection 10 mg Intravenous Q6H PRN       Today's Physical Exam:   General: appears morbidly obese and in fair health. Acutely ill but in no acute distress.   Eyes: Pupils equal and round, reactive to light    HENT: Head atraumatic and normocephalic   Neck: No JVD or thyromegaly or lymphadenopathy   Lungs: CTAB, non labored breathing, no rales or wheezing but auscultation is somewhat hindered by patient's  obese body habitus    Cardiovascular: regular rate and rhythm, S1, S2 normal, no murmur   Abdomen: morbidly obese, surgically tender; incision c/d/i with some ecchymosis of the inferior lateral site; unable to auscultate bowel sounds but auscultation is hindered by obese body habitus  Extremities: extremities normal, atraumatic, no cyanosis or edema   Skin: Skin warm and dry   Neurologic: Grossly normal   Psychiatric: flat affect, behavior     I/O:  I/O last 24 hours:      Intake/Output Summary (Last 24 hours) at 10/25/2017 1138  Last data filed at 10/25/2017 1000  Gross per 24 hour   Intake 3658 ml   Output 625 ml   Net 3033 ml     I/O current shift:  01/31 0700 - 01/31 1859  In: 42 [P.O.:60]  Out: 100 [Urine:100]      Labs  Please indicate ordered or reviewed)  Reviewed:   I have reviewed all lab results.  Lab Results for Last 24 Hours:    Results for orders placed or performed during the hospital encounter of 10/24/17 (from the past 24 hour(s))   TYPE AND SCREEN   Result Value Ref Range    UNITS ORDERED NOT STATED         ABO/RH(D) O POSITIVE     ANTIBODY SCREEN NEGATIVE     SPECIMEN EXPIRATION DATE 10/27/2017  POC FINGERSTICK GLUCOSE - [facility] (RESULTS)   Result Value Ref Range    GLUCOSE, POC 229 (H) 60 - 100 mg/dl   POC FINGERSTICK GLUCOSE - [facility] (RESULTS)   Result Value Ref Range    GLUCOSE, POC 238 (H) 60 - 100 mg/dl   CBC W/AUTO DIFF - [facility] ONLY   Result Value Ref Range    WBC 15.3 (H) 4.0 - 11.0 x10^3/uL    RBC 4.02 4.00 - 5.10 x10^6/uL    HGB 11.5 (L) 12.0 - 15.5 g/dL    HCT 35.2 (L) 36.0 - 45.0 %    MCV 87.6 82.0 - 97.0 fL    MCH 28.7 27.5 - 33.2 pg    MCHC 32.8 32.0 - 36.0 g/dL    RDW 14.6 11.0 - 16.0 %    PLATELETS 256 150 - 450 x10^3/uL    MPV 10.1 7.4 - 10.5 fL    NEUTROPHIL % 93 (H) 43 - 76 %    LYMPHOCYTE % 3 (L) 15 - 43 %    MONOCYTE % 4 (L) 5 - 12 %    EOSINOPHIL % 0 0 - 5 %    BASOPHIL % 0 0 - 3 %    NEUTROPHIL # 14.30 (H) 1.50 - 6.50 x10^3/uL    LYMPHOCYTE # 0.40 (L) 1.00 - 4.80 x10^3/uL     MONOCYTE # 0.60 0.20 - 0.90 x10^3/uL    EOSINOPHIL # 0.00 0.00 - 0.50 x10^3/uL    BASOPHIL # 0.00 0.00 - 0.10 V78^5/YI   BASIC METABOLIC PANEL   Result Value Ref Range    SODIUM 135 (L) 136 - 145 mmol/L    POTASSIUM 4.9 3.4 - 5.1 mmol/L    CHLORIDE 108 101 - 111 mmol/L    CO2 TOTAL 18 (L) 22 - 32 mmol/L    ANION GAP 9 3 - 11 mmol/L    CALCIUM 8.4 (L) 8.6 - 10.3 mg/dL    GLUCOSE 259 (H) 70 - 110 mg/dL    BUN 24 (H) 6 - 20 mg/dL    CREATININE 1.71 (H) 0.44 - 1.00 mg/dL    BUN/CREA RATIO 14 6 - 22    ESTIMATED GFR 32 (L) >60 mL/min/1.50m^2   POC FINGERSTICK GLUCOSE - [facility] (RESULTS)   Result Value Ref Range    GLUCOSE, POC 231 (H) 60 - 100 mg/dl   BASIC METABOLIC PANEL   Result Value Ref Range    SODIUM 135 (L) 136 - 145 mmol/L    POTASSIUM 4.9 3.4 - 5.1 mmol/L    CHLORIDE 105 101 - 111 mmol/L    CO2 TOTAL 20 (L) 22 - 32 mmol/L    ANION GAP 10 3 - 11 mmol/L    CALCIUM 8.7 8.6 - 10.3 mg/dL    GLUCOSE 206 (H) 70 - 110 mg/dL    BUN 28 (H) 6 - 20 mg/dL    CREATININE 2.17 (H) 0.44 - 1.00 mg/dL    BUN/CREA RATIO 13 6 - 22    ESTIMATED GFR 24 (L) >60 mL/min/1.32m^2   CBC WITH DIFF   Result Value Ref Range    WBC 10.2 4.0 - 11.0 x10^3/uL    RBC 3.77 (L) 4.00 - 5.10 x10^6/uL    HGB 11.2 (L) 12.0 - 15.5 g/dL    HCT 33.5 (L) 36.0 - 45.0 %    MCV 88.8 82.0 - 97.0 fL    MCH 29.7 27.5 - 33.2 pg    MCHC 33.5 32.0 - 36.0 g/dL  RDW 14.1 11.0 - 16.0 %    PLATELETS 260 150 - 450 x10^3/uL    MPV 9.8 7.4 - 10.5 fL    NEUTROPHIL % 85 (H) 43 - 76 %    LYMPHOCYTE % 7 (L) 15 - 43 %    MONOCYTE % 8 5 - 12 %    EOSINOPHIL % 0 0 - 5 %    BASOPHIL % 0 0 - 3 %    NEUTROPHIL # 8.70 (H) 1.50 - 6.50 x10^3/uL    LYMPHOCYTE # 0.70 (L) 1.00 - 4.80 x10^3/uL    MONOCYTE # 0.90 0.20 - 0.90 x10^3/uL    EOSINOPHIL # 0.00 0.00 - 0.50 x10^3/uL    BASOPHIL # 0.00 0.00 - 0.10 x10^3/uL   POC FINGERSTICK GLUCOSE - [facility] (RESULTS)   Result Value Ref Range    GLUCOSE, POC 192 (H) 60 - 100 mg/dl           Radiology Tests (Please indicate ordered or  reviewed)  Interpreted by radiologist and individually reviewed by me: N/A      Problem List:  Active Hospital Problems   (*Primary Problem)    Diagnosis   . *Left renal mass   . Morbid obesity       Assessment/ Plan:     Post-op pain:  --S/P partial nephrectomy, laparoscopic done 10/24/17 (due to renal mass by Dr. Gabriela Eves  --defer specific post-op care to same  --routine post-op care to include turn, cough, deep breathe (encourage IS); mobility as soon as ok with surgeon    AKI:  --serum BUN 28; Cr+ 2.17; GFR 24  --likely secondary to renal mass and surgery  PLAN  --avoid unnecessary nephrotoxins  --renal dose meds when indicated    DM II, uncontrolled:  --glucose 192-238 since admission  --refuses insulin coverage  --last HA1C was in 2016  PLAN  --restart home dose of glipizide  --add HA1C to labs  --continue FSBS and encourage SSIP  --diabetic diet    HTN:  --BP moderately elevated  --home meds include Hyzaar (non-formulary) and amlodipine  PLAN  --resume home meds, giving Hyzaar components of losartan and HCTZ  --monitor BP trends    HLD:  --no immediate issues  PLAN  --continue pravastatin as at home    Morbid obesity:  --BMI 47.12  --weight loss discussed  PLAN  --continue to provide education concerning healthy lifestyle changes for weight reduction      Overall PLAN  Monitor BP to see that it is coming down with addition of home antihypertensives  Encourage insulin coverage to control glucose and promote adequate healing      Disposition:   Discharge will be pending test results, need for further testing, or further complications.    Jobe Igo, FNP-C

## 2017-10-26 ENCOUNTER — Inpatient Hospital Stay (HOSPITAL_BASED_OUTPATIENT_CLINIC_OR_DEPARTMENT_OTHER): Payer: No Typology Code available for payment source

## 2017-10-26 LAB — COMPREHENSIVE METABOLIC PROFILE - BMC/JMC ONLY
ALBUMIN/GLOBULIN RATIO: 1 (ref 0.8–2.0)
ALBUMIN: 2.8 g/dL — ABNORMAL LOW (ref 3.5–5.0)
ALKALINE PHOSPHATASE: 56 U/L (ref 38–126)
ALT (SGPT): 18 U/L (ref 14–54)
ANION GAP: 7 mmol/L (ref 3–11)
AST (SGOT): 23 U/L (ref 15–41)
BILIRUBIN TOTAL: 1 mg/dL (ref 0.3–1.2)
BUN/CREA RATIO: 15 (ref 6–22)
BUN: 35 mg/dL — ABNORMAL HIGH (ref 6–20)
CALCIUM: 8.2 mg/dL — ABNORMAL LOW (ref 8.6–10.3)
CHLORIDE: 104 mmol/L (ref 101–111)
CO2 TOTAL: 22 mmol/L (ref 22–32)
CREATININE: 2.4 mg/dL — ABNORMAL HIGH (ref 0.44–1.00)
ESTIMATED GFR: 21 mL/min/{1.73_m2} — ABNORMAL LOW (ref >60)
GLUCOSE: 114 mg/dL — ABNORMAL HIGH (ref 70–110)
POTASSIUM: 4.2 mmol/L (ref 3.4–5.1)
PROTEIN TOTAL: 5.5 g/dL — ABNORMAL LOW (ref 6.4–8.3)
SODIUM: 133 mmol/L — ABNORMAL LOW (ref 136–145)

## 2017-10-26 LAB — CBC W/AUTO DIFF
BASOPHIL %: 0 % (ref 0–3)
EOSINOPHIL #: 0 x10ˆ3/uL (ref 0.00–0.50)
EOSINOPHIL %: 0 % (ref 0–5)
HCT: 29.8 % — ABNORMAL LOW (ref 36.0–45.0)
LYMPHOCYTE #: 1.7 x10ˆ3/uL (ref 1.00–4.80)
LYMPHOCYTE %: 19 % (ref 15–43)
MCH: 29.8 pg (ref 27.5–33.2)
MCHC: 33.6 g/dL (ref 32.0–36.0)
MCV: 88.6 fL (ref 82.0–97.0)
MONOCYTE #: 0.7 x10ˆ3/uL (ref 0.20–0.90)
MONOCYTE %: 8 % (ref 5–12)
MPV: 9.3 fL (ref 7.4–10.5)
NEUTROPHIL #: 6.4 x10ˆ3/uL (ref 1.50–6.50)
NEUTROPHIL %: 72 % (ref 43–76)
PLATELETS: 213 x10ˆ3/uL (ref 150–450)
RBC: 3.36 x10ˆ6/uL — ABNORMAL LOW (ref 4.00–5.10)
RDW: 14.4 % (ref 11.0–16.0)
WBC: 8.9 x10ˆ3/uL (ref 4.0–11.0)

## 2017-10-26 LAB — CBC W/NO DIFF
HCT: 28.4 % — ABNORMAL LOW (ref 36.0–45.0)
HGB: 9.6 g/dL — ABNORMAL LOW (ref 12.0–15.5)
MCH: 29.9 pg (ref 27.5–33.2)
MCHC: 33.8 g/dL (ref 32.0–36.0)
MCV: 88.3 fL (ref 82.0–97.0)
MPV: 9.6 fL (ref 7.4–10.5)
RDW: 14.2 % (ref 11.0–16.0)

## 2017-10-26 LAB — POC FINGERSTICK GLUCOSE - BMC/JMC (RESULTS)
GLUCOSE, POC: 101 mg/dl — ABNORMAL HIGH (ref 60–100)
GLUCOSE, POC: 117 mg/dl — ABNORMAL HIGH (ref 60–100)
GLUCOSE, POC: 91 mg/dl (ref 60–100)

## 2017-10-26 LAB — PROTEIN/CREATININE RATIO, URINE, RANDOM
CREATININE RANDOM URINE: 63 mg/dL
PROTEIN RANDOM URINE: 36 mg/dL — ABNORMAL HIGH (ref <10)
PROTEIN/CREATININE RATIO RANDOM URINE: 0.571 mg/mg — ABNORMAL HIGH (ref <=0.200)

## 2017-10-26 LAB — HGA1C (HEMOGLOBIN A1C WITH EST AVG GLUCOSE)
ESTIMATED AVERAGE GLUCOSE: 146 mg/dL
HEMOGLOBIN A1C: 6.7 % — ABNORMAL HIGH (ref <5.7)

## 2017-10-26 LAB — PARATHYROID HORMONE (PTH): PTH: 181.1 pg/mL — ABNORMAL HIGH (ref 12.0–88.0)

## 2017-10-26 MED ORDER — OXYCODONE-ACETAMINOPHEN 5 MG-325 MG TABLET
1.0000 | ORAL_TABLET | ORAL | 0 refills | Status: DC | PRN
Start: 2017-10-26 — End: 2018-06-10

## 2017-10-26 MED ORDER — LOSARTAN 25 MG TABLET
50.0000 mg | ORAL_TABLET | Freq: Every day | ORAL | Status: DC
Start: 2017-10-27 — End: 2017-10-28
  Administered 2017-10-27 – 2017-10-28 (×2): 50 mg via ORAL
  Filled 2017-10-26 (×4): qty 2

## 2017-10-26 MED ADMIN — SODIUM CHLORIDE 0.45 % W/ ADDITIVES: ORAL | @ 15:00:00 | NDC 00264780200

## 2017-10-26 MED ADMIN — nystatin 100,000 unit/gram topical powder: ORAL | @ 08:00:00 | NDC 00574200815

## 2017-10-26 NOTE — Nurses Notes (Signed)
Dr. Kerin Perna called to notify nursing patient has been ordered CT scan and to order nephrology consult for today.

## 2017-10-26 NOTE — Nurses Notes (Signed)
Received report of CT scan.

## 2017-10-26 NOTE — Care Plan (Signed)
Up in room and walking in hall. Doing better.  Fanny pack with meds in place.  No futher drainage noted.  Incisions with some bruising noted.  Scripts given to patient by Dr Kerin Perna for am discharge.

## 2017-10-26 NOTE — Nurses Notes (Signed)
Up in chair. Taking po well. Up to bathroom. Voided well. bs 101. medis given.

## 2017-10-26 NOTE — Consults (Signed)
PATIENT NAME: [name], [name]   HOSPITAL NUMBER:  [id number]  DATE OF SERVICE: 10/26/2017  DATE OF BIRTH:  [birth year]    CONSULTATION    NEPHROLOGY CONSULTATION    REFERRING PHYSICIAN:  Ruta Hinds, MD.    REASON FOR CONSULTATION:  Consult was requested by Dr. Ruta Hinds for advanced kidney failure.    HISTORY OF PRESENT ILLNESS:  The patient is a 52 year old female with diabetes mellitus, hypertension, who follows with Dr. Georgia Dom at [facility].  I do not have records for the last 2 years from [place].  She did have some mild acute renal failure in 2016 that was documented in our system.  She did return to normal.  The patient says that in March or April of 2018, Dr. Georgia Dom had asked her to stop taking non-steroidal anti-inflammatory drugs, which she had been taking for many years, because of some abnormal kidney tests.  Again, labs done in December suggested that she had reduced kidney function, but the patient does not remember much about the specifics.  She sought medical evaluation in mid January because of some right flank pain.  During that evaluation, renal and abdominal imaging showed an incidental left renal mass.  She eventually was seen by Dr. Kerin Perna, who on October 24, 2017, performed a hand-assisted partial left nephrectomy.  Labs on October 08, 2017 and October 24, 2017 showed a creatinine between 1.7 and 1.9 with GFRs ranging between 28 and 32 mL/minute.  Since her partial nephrectomy, the renal function has decreased some, as expected.  She has not had any documented hypotension or received any known nephrotoxic medications.  Her urine output initially was sluggish, but has picked up dramatically in the last several hours.  Other than feeling sore at the surgical site, she is feeling well.  She denies taking any non-steroidal anti-inflammatory drugs since spring of 2018.    PAST MEDICAL HISTORY:  Includes diabetes mellitus type 2, hypertension, obesity, advanced  chronic kidney disease stage 3,  clear cell renal cell carcinoma based on pathology of partial nephrectomy.    PAST SURGICAL HISTORY:  Cholecystectomy, tubal ligation, surgical removal of a tubal pregnancy, partial left oophorectomy.    SOCIAL HISTORY:  No tobacco or alcohol.    MEDICATIONS:  See electronic medical record.    ALLERGIES:  See electronic medical record.    FAMILY HISTORY:  No known kidney failure.    REVIEW OF SYSTEMS:  Left flank surgical site pain.  Some fatigue.  No fever, chills, nausea, vomiting, chest pain, abdominal pain, dyspnea, gross hematuria, kidney stones, dysuria.    PHYSICAL EXAMINATION:  She is an obese female in mild distress from post-surgical pain.  Temperature 36.6 degrees Celsius, pulse 107, blood pressure 135/90, respiratory rate 18, pulse oximetry 96%, weight 137.7 kilograms.    HEENT:  Unremarkable, no jaundice.    LUNGS:  Clear without inspiratory crackles or expiratory wheezes.    HEART:  Regular, no pericardial friction rub.    ABDOMEN:  Obese positive bowel sounds.  Bandages overlying the surgical site and left flank.  There is 1+ abdominal wall edema.   EXTREMITIES:  1-2+ pitting edema both ankles.    SKIN:  Warm and dry.  No asterixis.    DIAGNOSTIC DATA:  See electronic medical record.  Hemoglobin 10.0 sodium 133, potassium 4.2, CO2 22, BUN 35, creatinine 2.4, estimated GFR 21 mL/minute, albumin 2.88.  Urinalysis: 50 mg/dL glucose ,greater than 500 mg/dL protein.     CT scan:  Left  renal mass.     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    IMPRESSION:  1. Chronic kidney disease stage 3 prior to partial nephrectomy, probably due to diabetic nephropathy.  Since partial nephrectomy performed,  she has advanced, as expected, to chronic kidney disease stage 4.  She is at high risk for progression to end-stage renal disease in the future.  2. Hypertension.  3. 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  4. 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  5. 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  6. Anemia secondary to chronic kidney disease.  7. Hyponatremia, likely due to hydrochlorothiazide.    RECOMMENDATION:  Discontinue hydrochlorothiazide.  Okay to continue amlodipine 2.5 milligrams daily and losartan, which I reduced the dose to 50 milligrams per day.  She should be on a diabetic renal diet.  Avoid non-steroidal anti-inflammatory drugs, proton pump inhibitors, fenofibrate, and intravenous contrast.  I ordered a PTH level and urine protein/creatinine ratio.  Okay to discharge when you feel she is ready.  Will need to follow up with urology and I will have my office call her and schedule hospital follow up with me in 3-4 weeks with labs just before.      Thank you for consulting me on this patient.        Marijo Sanes, MD              CC:   Candee Furbish, MD   Fax: [phone]     Ruta Hinds, MD   Fax: [phone]       DD:  10/26/2017 15:25:16  DT:  10/26/2017 16:09:13 DJ  D#:  [id number]

## 2017-10-26 NOTE — Nurses Notes (Signed)
Sitting up in chair, walked from chair to bathroom, from bathroom to sink.  Stated "it feels good to stand up".  Encouraged to walk in hallway.  Walked from room to double doors on east side of unit and back to room with standby assist.  Tolerated well.  Returned to sitting in chair at bedside.  Set up for basin bath.

## 2017-10-26 NOTE — Nurses Notes (Signed)
Called to room by pt.  C/o "unbearable" pain in her lower back and buttocks. N7.   Pt up from bed.  To the bathroom.  Void 300CC.  Chair obtained and pt sat up in chair. VS obtained. SEDs re-connected. PRN pain med admin per [facility]. Hot tea obtained per pt request. Will re-assess pt pain level < 1hr.

## 2017-10-26 NOTE — Nurses Notes (Signed)
Pt up to the bathroom.  While sitting on the toilet pt states she feels "shaky". POC BS obtained. Glucose: 91. Pt given drink of water. Ambulated to bed with standby assistance. Offered pt something to eat or drink.  Pt given SF orange popsicle per request. Will continue to monitor.

## 2017-10-26 NOTE — Nurses Notes (Signed)
Dr Judeth Horn present and saw.  To xray for ct scan.

## 2017-10-26 NOTE — Progress Notes (Signed)
Urology Progress note      Kidney Cancer     [name] is a 52 y.o. female POD #2 s/p L partial nephrectomy.  Patient had a drop in H/H this morning and tachycardia through the day so I obtained a CT scan even though afternoon CBC improved slightly.  CT scan appears to have only normal post-surgical changes with a small fluid collection on the left subcutaneously which is likely a small hematoma causing the ecchymosis on her left abdomen.  The resection site appears normal for this soon after surgery and there was no significant changes concerning for active bleeding.  I removed very little healthy kidney during the surgery using an enucleation technique, so I expect that her kidney function should not worsen significantly from the surgery.  She was dry with very little urine output for the first day after surgery, and I suspect this is the cause of her acute rise in SCr and I remain hopeful that she will make good recovery once her Hgb normalizes now that she is replenished with fluid and making good urine output.  She remains appropriately fatigued and has incisional pain only.  No n/v. No fever.    BP (!) 161/69   Pulse (!) 106   Temp 37 C (98.6 F)   Resp 20   Ht 1.702 m (5\' 7" )   Wt (!) 137.7 kg (303 lb 8 oz)   SpO2 93%   BMI 47.53 kg/m        PE: Incisions c/d/i, incisional tenderness. Ecchymosis stable.    Labs, CT reviewed    POD #2 s/p L partial Nx  -Appreciate Dr Areta Haber recommendations for Rx changes to protect kidney function  -Appreciate medical management by Hospitalists  -Continue SCD's, ambulation, IS  -Will check labs in AM and anticipate discharge home if normalizing     Ruta Hinds, MD

## 2017-10-26 NOTE — Progress Notes (Signed)
Consult Dictated  Impressions  1. Baseline advanced CKD stage 3 due to diabetic nephropathy prior to partial nephrectomy. After partial nephrectomy, her kidney disease has progressed to CKD stage 4 level. She is at high risk for progression to ESRD in future.   2. HTN  3. 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 Grade 2 clear cell RCCA based on pathology from partial left nephrectomy  5. Probable secondary hyperparathyroidism  6. Anemia secondary to CKD  7. Hyponatremia probably due to HCTZ  Recommendations:  1. D/C HCTZ  2. OK to continue amlodipine 2.5 mg/day and Losartan 50 mg/day  3. Diabetic renal diet  4. Avoid NSAIDs, PPI, fenofibrate, IV contrast.  5. I ordered iPTH and urine protein:creatinine ratio.   6. OK to discharge when you feel she is ready. Will need to follow-up with Urology, and will have my office call her and schedule visit in 3-4 weeks with me.

## 2017-10-26 NOTE — Nurses Notes (Signed)
Dr Kerin Perna called to check up on pat. Ordered stat cbc

## 2017-10-26 NOTE — Nurses Notes (Signed)
Upon pain reassessment, pt stating she is "uncomfortable" c/o discomfort in lower back and L buttock. Pt stood to reposition/ remove 2 pillows under lower back.  At this time noticed trace amt of serosanguinous drainage from Marcain ball tubing site. Excess liquid cleaned up and dressing reinforced.  Nursing Supervisor, Hoyle Sauer called to room to further assess site per this RN request. Per NS, normal for some drainage to occur. Dressing is intact. Will continue to monitor. Pt repositioned in bed and PRN Benedryl admin per pt request. Call bell w/ in pt reach.

## 2017-10-26 NOTE — Nurses Notes (Signed)
Returned from xray

## 2017-10-26 NOTE — Care Plan (Signed)
Pt slept through care for majority of shift.  Waking with lower L abd and buttock/back pain N4-7. Medicated X2 PRN med with some relief. 2100 BS 104 no SSIP given. Pt taking PO fluids well. 950 CC clear yellow total urine output. Bilateral (+) peripheral edema in feet/ ankles. Pt ambulating with 1 assist. Ambulated in halls at start of shift.  Plan to walk again after breakfast this morning. CBC and CMP drawn this morning. Awaiting results. Hand-off report given to Quenten Raven, RN who will resume pt care @ 0700.

## 2017-10-26 NOTE — Nurses Notes (Signed)
CT results available in Epic.  Dr. Kerin Perna paged with message that CT results are available.

## 2017-10-26 NOTE — Progress Notes (Signed)
[facility]  [place], [place] [address]    IP PROGRESS NOTE    Jobe Igo, FNP-C      Rhae Hammock  Date of Admission:  10/24/2017  Date of Birth:  September 08, 1966  Date of Service:  10/26/2017    Chief Complaint: post-op abdominal pain    Subjective: above noted; no new complaints    Vital Signs:  Temp (24hrs) Max:37.6 C (99.7 F)      Temperature: 37 C (98.6 F)  BP (Non-Invasive): (!) 161/69  MAP (Non-Invasive): 91 mmHG  Heart Rate: (!) 106  Respiratory Rate: 20  Pain Score (Numeric, Faces): 4  SpO2-1: 93 %    Current Medications:    Current Facility-Administered Medications:  amLODIPine (NORVASC) tablet 2.5 mg Oral Daily   bupivacaine (MARCAINE) 0.25% local infiltration ON-Q pump 270 mL Local Infiltration Continuous   SSIP insulin lispro (HUMALOG) 100 units/mL injection 2-12 Units Subcutaneous 4x/day AC   And      dextrose 50% (0.5 g/mL) injection - syringe 12.5 g Intravenous Q15 Min PRN   diphenhydrAMINE (BENADRYL) capsule 25 mg Oral Q6H PRN   docusate sodium (COLACE) capsule 100 mg Oral 2x/day   glipiZIDE (GLUCOTROL XL) extended release tablet 5 mg Oral Daily with Breakfast   [START ON 10/27/2017] losartan (COZAAR) tablet 50 mg Oral Daily   magnesium hydroxide (MILK OF MAGNESIA) 400mg  per 26mL oral liquid 30 mL Oral Daily PRN   morphine 4 mg/mL injection 2 mg Intravenous Q2H PRN   oxyCODONE-acetaminophen (PERCOCET) 5-325mg  per tablet 1 Tab Oral Q4H PRN   oxyCODONE-acetaminophen (PERCOCET) 5-325mg  per tablet 2 Tab Oral Q4H PRN   pravastatin (PRAVACHOL) tablet 10 mg Oral Daily after Breakfast   prochlorperazine (COMPAZINE) 5 mg/mL injection 10 mg Intravenous Q6H PRN       Today's Physical Exam:   General: appears morbidly obese and in fair health. Acutely ill but in no acute distress.   Eyes: Pupils equal and round, reactive to light    HENT: Head atraumatic and normocephalic   Neck: No JVD or thyromegaly or lymphadenopathy   Lungs: CTAB, non labored breathing, no rales or wheezing but  auscultation is somewhat hindered by patient's obese body habitus    Cardiovascular: regular rate and rhythm, S1, S2 normal, no murmur   Abdomen: morbidly obese, surgically tender; incision c/d/i with some ecchymosis of the inferior lateral site; unable to auscultate bowel sounds but auscultation is hindered by obese body habitus  Extremities: extremities normal, atraumatic, no cyanosis or edema   Skin: Skin warm and dry   Neurologic: Grossly normal   Psychiatric: flat affect, behavior     I/O:  I/O last 24 hours:      Intake/Output Summary (Last 24 hours) at 10/26/2017 1706  Last data filed at 10/26/2017 1427  Gross per 24 hour   Intake 1480 ml   Output 2225 ml   Net -745 ml     I/O current shift:  02/01 0700 - 02/01 1859  In: 240 [P.O.:240]  Out: 1275 [Urine:1275]      Labs  Please indicate ordered or reviewed)  Reviewed:   I have reviewed all lab results.  Lab Results for Last 24 Hours:    Results for orders placed or performed during the hospital encounter of 10/24/17 (from the past 24 hour(s))   POC FINGERSTICK GLUCOSE - [facility] (RESULTS)   Result Value Ref Range    GLUCOSE, POC 104 (H) 60 - 100 mg/dl   POC FINGERSTICK GLUCOSE -  [facility] (RESULTS)   Result Value Ref Range    GLUCOSE, POC 91 60 - 100 mg/dl   CBC W/NO DIFF - [facility] ONLY   Result Value Ref Range    WBC 10.1 4.0 - 11.0 x10^3/uL    RBC 3.21 (L) 4.00 - 5.10 x10^6/uL    HGB 9.6 (L) 12.0 - 15.5 g/dL    HCT 28.4 (L) 36.0 - 45.0 %    MCV 88.3 82.0 - 97.0 fL    MCH 29.9 27.5 - 33.2 pg    MCHC 33.8 32.0 - 36.0 g/dL    RDW 14.2 11.0 - 16.0 %    PLATELETS 198 150 - 450 x10^3/uL    MPV 9.6 7.4 - 10.5 fL   COMPREHENSIVE METABOLIC PROFILE - [facility] ONLY   Result Value Ref Range    SODIUM 133 (L) 136 - 145 mmol/L    POTASSIUM 4.2 3.4 - 5.1 mmol/L    CHLORIDE 104 101 - 111 mmol/L    CO2 TOTAL 22 22 - 32 mmol/L    ANION GAP 7 3 - 11 mmol/L    BUN 35 (H) 6 - 20 mg/dL    CREATININE 2.40 (H) 0.44 - 1.00 mg/dL    BUN/CREA RATIO 15 6 - 22    ESTIMATED GFR 21 (L) >60  mL/min/1.31m^2    ALBUMIN 2.8 (L) 3.5 - 5.0 g/dL    CALCIUM 8.2 (L) 8.6 - 10.3 mg/dL    GLUCOSE 114 (H) 70 - 110 mg/dL    ALKALINE PHOSPHATASE 56 38 - 126 U/L    ALT (SGPT) 18 14 - 54 U/L    AST (SGOT) 23 15 - 41 U/L    BILIRUBIN TOTAL 1.0 0.3 - 1.2 mg/dL    PROTEIN TOTAL 5.5 (L) 6.4 - 8.3 g/dL    ALBUMIN/GLOBULIN RATIO 1.0 0.8 - 2.0   POC FINGERSTICK GLUCOSE - [facility] (RESULTS)   Result Value Ref Range    GLUCOSE, POC 101 (H) 60 - 100 mg/dl   POC FINGERSTICK GLUCOSE - [facility] (RESULTS)   Result Value Ref Range    GLUCOSE, POC 117 (H) 60 - 100 mg/dl   CBC W/AUTO DIFF - [facility] ONLY   Result Value Ref Range    WBC 8.9 4.0 - 11.0 x10^3/uL    RBC 3.36 (L) 4.00 - 5.10 x10^6/uL    HGB 10.0 (L) 12.0 - 15.5 g/dL    HCT 29.8 (L) 36.0 - 45.0 %    MCV 88.6 82.0 - 97.0 fL    MCH 29.8 27.5 - 33.2 pg    MCHC 33.6 32.0 - 36.0 g/dL    RDW 14.4 11.0 - 16.0 %    PLATELETS 213 150 - 450 x10^3/uL    MPV 9.3 7.4 - 10.5 fL    NEUTROPHIL % 72 43 - 76 %    LYMPHOCYTE % 19 15 - 43 %    MONOCYTE % 8 5 - 12 %    EOSINOPHIL % 0 0 - 5 %    BASOPHIL % 0 0 - 3 %    NEUTROPHIL # 6.40 1.50 - 6.50 x10^3/uL    LYMPHOCYTE # 1.70 1.00 - 4.80 x10^3/uL    MONOCYTE # 0.70 0.20 - 0.90 x10^3/uL    EOSINOPHIL # 0.00 0.00 - 0.50 x10^3/uL    BASOPHIL # 0.00 0.00 - 0.10 x10^3/uL   PARATHYROID HORMONE (PTH)   Result Value Ref Range    PTH 181.1 (H) 12.0 - 88.0 pg/mL   PROTEIN/CREATININE RATIO, URINE, RANDOM  Result Value Ref Range    PROTEIN RANDOM URINE 36 (H) <10 mg/dL mg/dL    CREATININE RANDOM URINE 63 No Reference Range Established mg/dL    PROTEIN/CREATININE RATIO RANDOM URINE 0.571 (H) <=0.200 mg/mg   POC FINGERSTICK GLUCOSE - [facility] (RESULTS)   Result Value Ref Range    GLUCOSE, POC 116 (H) 60 - 100 mg/dl           Radiology Tests (Please indicate ordered or reviewed)  Interpreted by radiologist and individually reviewed by me: N/A      Problem List:  Active Hospital Problems   (*Primary Problem)    Diagnosis   . 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 Morbid obesity   . Essential hypertension     Chronic     Not at goal. Losartan increased already. Will add Amlodipine 5. Goal <120/80     . Type 2 diabetes mellitus (CMS HCC)     Chronic     Control much improved - A1C 5.2% in January. Continue current regimen         Assessment/ Plan:     Post-op pain:  --S/P partial nephrectomy, laparoscopic done 10/24/17 (due to renal mass by Dr. Gabriela Eves  --defer specific post-op care to same  --routine post-op care to include turn, cough, deep breathe (encourage IS); mobility as soon as ok with surgeon    AKI:  --serum BUN 35; Cr+ 2.40; GFR 21  --likely secondary to renal mass and surgery  PLAN  --avoid unnecessary nephrotoxins  --renal dose meds when indicated  --nephrology has been consulted    DM II, uncontrolled:  --glucose 91-197 for past 24 hours  --refuses insulin coverage  --HA1C 6.7 this admission  PLAN  --continue glipizide  --continue FSBS and encourage SSIP  --diabetic diet    HTN:  --BP moderately elevated  --home meds include Hyzaar (non-formulary) and amlodipine  PLAN  --resume home meds, giving Hyzaar components of losartan and HCTZ  --monitor BP trends    HLD:  --no immediate issues  PLAN  --continue pravastatin as at home    Morbid obesity:  --BMI 47.63  --weight loss discussed  PLAN  --continue to provide education concerning healthy lifestyle changes for weight reduction      Overall PLAN  Monitor BP to see that it is coming down with addition of home antihypertensives  Encourage insulin coverage to control glucose and promote adequate healing      Disposition:   Discharge will be pending test results, need for further testing, or further complications.    Jobe Igo, FNP-C

## 2017-10-26 NOTE — Nurses Notes (Signed)
Returned to bed to nap.

## 2017-10-26 NOTE — Nurses Notes (Signed)
Up to chair post voiding 400.  Drinking tea. Walked some.

## 2017-10-27 DIAGNOSIS — N189 Chronic kidney disease, unspecified: Secondary | ICD-10-CM

## 2017-10-27 DIAGNOSIS — N179 Acute kidney failure, unspecified: Secondary | ICD-10-CM

## 2017-10-27 DIAGNOSIS — I10 Essential (primary) hypertension: Secondary | ICD-10-CM

## 2017-10-27 DIAGNOSIS — D62 Acute posthemorrhagic anemia: Secondary | ICD-10-CM

## 2017-10-27 LAB — BASIC METABOLIC PANEL
ANION GAP: 10 mmol/L (ref 3–11)
BUN/CREA RATIO: 17 (ref 6–22)
BUN: 30 mg/dL — ABNORMAL HIGH (ref 6–20)
CALCIUM: 8.4 mg/dL — ABNORMAL LOW (ref 8.6–10.3)
CHLORIDE: 102 mmol/L (ref 101–111)
CO2 TOTAL: 22 mmol/L (ref 22–32)
CREATININE: 1.77 mg/dL — ABNORMAL HIGH (ref 0.44–1.00)
ESTIMATED GFR: 30 mL/min/1.73mˆ2 — ABNORMAL LOW (ref >60)
GLUCOSE: 198 mg/dL — ABNORMAL HIGH (ref 70–110)
POTASSIUM: 4.2 mmol/L (ref 3.4–5.1)
SODIUM: 134 mmol/L — ABNORMAL LOW (ref 136–145)

## 2017-10-27 LAB — POC FINGERSTICK GLUCOSE - BMC/JMC (RESULTS)
GLUCOSE, POC: 127 mg/dl — ABNORMAL HIGH (ref 60–100)
GLUCOSE, POC: 142 mg/dl — ABNORMAL HIGH (ref 60–100)
GLUCOSE, POC: 98 mg/dl (ref 60–100)

## 2017-10-27 LAB — CBC W/NO DIFF
HGB: 9.4 g/dL — ABNORMAL LOW (ref 12.0–15.5)
MCHC: 33.4 g/dL (ref 32.0–36.0)
MCV: 87.6 fL (ref 82.0–97.0)
MPV: 9.4 fL (ref 7.4–10.5)
PLATELETS: 206 x10ˆ3/uL (ref 150–450)
RBC: 3.21 x10ˆ6/uL — ABNORMAL LOW (ref 4.00–5.10)
RDW: 14.3 % (ref 11.0–16.0)
WBC: 8.1 x10ˆ3/uL (ref 4.0–11.0)

## 2017-10-27 MED ORDER — AMLODIPINE 5 MG TABLET
5.0000 mg | ORAL_TABLET | Freq: Every day | ORAL | Status: DC
Start: 2017-10-28 — End: 2017-10-28
  Administered 2017-10-28: 5 mg via ORAL
  Filled 2017-10-27: qty 1

## 2017-10-27 MED ADMIN — PANTOPRAZOLE 80MG IN NS 100ML CONTINUOUS INFUSION: ORAL | @ 21:00:00 | NDC 00008092355

## 2017-10-27 NOTE — Progress Notes (Signed)
[facility]  PROGRESS NOTE        Subjective:  This is an 52 y.o. year old female we are following for CKD and partial nephrectomy.  Today she is feeling better. No shortness of breath. No urinary complaints.       Objective:    Filed Vitals:    10/27/17 0311 10/27/17 0800 10/27/17 0940 10/27/17 1120   BP: (!) 166/88 (!) 173/101 (!) 168/87 (!) 160/82   Pulse: 98 (!) 105  93   Resp: 20 20  18    Temp: 36.9 C (98.5 F) 37 C (98.6 F)  36.8 C (98.3 F)   SpO2: 96% 94%  93%       Physical Exam   Constitutional: She is well-developed, well-nourished, and in no distress.   Morbidly obese     HENT:   Head: Normocephalic and atraumatic.   Neck: Neck supple.   Cardiovascular: Normal rate and normal heart sounds. Exam reveals no gallop and no friction rub.   No murmur heard.  Pulmonary/Chest: Effort normal. No respiratory distress. She has no wheezes. She has no rales. She exhibits no tenderness.   Abdominal: Soft.   Musculoskeletal: She exhibits edema.   Trace edema   Neurological: She is alert.   Skin: Skin is warm and dry.   Psychiatric: Affect normal.         Lab Results   Component Value Date    SODIUM 134 (L) 10/27/2017    SODIUM 136 12/07/2014    POTASSIUM 4.2 10/27/2017    POTASSIUM 4.1 12/07/2014    CHLORIDE 102 10/27/2017    CHLORIDE 104 12/07/2014    CO2 22 10/27/2017    CO2 28 12/07/2014    ANIONGAP 10 10/27/2017    ANIONGAP 4 12/07/2014    BUN 30 (H) 10/27/2017    BUN 15 12/07/2014    CREATININE 1.77 (H) 10/27/2017    CREATININE 0.94 12/07/2014    BUNCRRATIO 17 10/27/2017    GFR 30 (L) 10/27/2017    GFR >60 12/07/2014    GLUCOSENF 198 (H) 10/27/2017       CBC  (Last 48 hours)    Date/Time WBC HGB HCT MCV PLATELETS    10/27/17 0859 8.1    9.4 (L)    28.1 (L)    87.6    206       10/26/17 1200 8.9    10.0 (L)    29.8 (L)    88.6    213       10/26/17 0554 10.1    9.6 (L)    28.4 (L)    88.3    198         WBC/Diff  (Last 48 hours)    Date/Time WBC Bands PMNs Lymphs Mono Eos Basos    10/27/17 0859 8.1     -- -- -- -- -- --    10/26/17 1200 8.9    -- 72    19    8    0    0       10/26/17 0554 10.1    -- -- -- -- -- --            Patient Active Problem List    Diagnosis   . Morbid obesity   . 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 Hyperkalemia   . AKI (acute kidney injury) (CMS HCC)   .  Slow transit constipation   . C. difficile diarrhea     Only 1 bowel movement. Continue vancomycin orally and contact isolation. Will continue oral Vanc throughout her course of antibiotics.     . Essential hypertension     Not at goal. Losartan increased already. Will add Amlodipine 5. Goal <120/80     . Obesity     Portion control, has already changed diet     . Cellulitis of foot, left     Left foot less warm.  WBC norma on Unasyn and Vancomycin. Continue same.  We decided to take her off for the next 2 weeks since she stands at work-then restart after spring break on April 4th.  If foot continues to improve she might be able to be discharged after another couple of days     . Type 2 diabetes mellitus (CMS HCC)     Control much improved - A1C 5.2% in January. Continue current regimen     . Diabetic foot infection (CMS HCC)         Assessment:     CKD 4   HTN   Partial nephrectomy    Plan     Adjust BP meds   F/u in office with Dr Judeth Horn   Renal function improved.

## 2017-10-27 NOTE — Care Plan (Signed)
BP (!) 166/88   Pulse 98   Temp 36.9 C (98.5 F)   Resp 20   Ht 1.702 m (5\' 7" )   Wt (!) 137.7 kg (303 lb 8 oz)   SpO2 96%   BMI 47.53 kg/m     Pt resting between care overnight. PIV in left forearm with questionable placement, removed per order. Main abdominal incision clean, dry, well approximated. Moderate old sanguinous drainage noted on adhesive bandage close to main incision. Marcaine infusing per device close to surgical site. Pt declined oral pain meds, pain was tolerable throughout shift. Pt requested to move to chair at 0300; pt ambulated 80 feet in hallway with standby assistance. Pt passing gas for first time since surgery. 1200 ml urine output this shift. Pt looking forward to d/c today.       Problem: Adult Inpatient Plan of Care  Goal: Plan of Care Review  Outcome: Ongoing (see interventions/notes)  Goal: Patient-Specific Goal (Individualization)  Outcome: Ongoing (see interventions/notes)  Goal: Absence of Hospital-Acquired Illness or Injury  Outcome: Ongoing (see interventions/notes)  Goal: Optimal Comfort and Wellbeing  Outcome: Ongoing (see interventions/notes)  Goal: Rounds/Family Conference  Outcome: Ongoing (see interventions/notes)     Problem: Fall Injury Risk  Goal: Absence of Fall and Fall-Related Injury  Outcome: Ongoing (see interventions/notes)     Problem: Diabetes Comorbidity  Goal: Blood Glucose Level Within Desired Range  Outcome: Ongoing (see interventions/notes)     Problem: Bleeding (Surgery Nonspecified)  Goal: Absence of Bleeding  Outcome: Ongoing (see interventions/notes)     Problem: Bowel Elimination Impaired (Surgery Nonspecified)  Goal: Effective Bowel Elimination  Outcome: Ongoing (see interventions/notes)     Problem: Infection (Surgery Nonspecified)  Goal: Absence of Infection Signs/Symptoms  Outcome: Ongoing (see interventions/notes)     Problem: Pain (Surgery Nonspecified)  Goal: Acceptable Pain Control  Outcome: Ongoing (see interventions/notes)     Problem:  Pain Acute  Goal: Optimal Pain Control  Outcome: Ongoing (see interventions/notes)

## 2017-10-27 NOTE — Nurses Notes (Signed)
Return page from Dr. Kerin Perna. Notified him of labs and hospitalist plan for blood pressure management. No orders received from Dr. Kerin Perna, orders to be placed by Dr. Kerin Perna. Plans to keep patient another day. Patient notified of plan, she is tearful but states an understanding.

## 2017-10-27 NOTE — Nurses Notes (Addendum)
Call received from Dr. Kerin Perna. Telephone orders received. Will call Dr. Kerin Perna with lab results and hospitalist discharge planning.   Hospitalist present on unit as well and notified of Dr. Charlett Lango plan of care for the day.

## 2017-10-27 NOTE — Progress Notes (Addendum)
Urology Progress note       Haiven Nardone is a 52 y.o. female postop day 3. Status post left partial nephrectomy.  Renal function is improving.  H&H dropped slightly today.  Pain control is good, patient is requiring very little oral narcotics.  Patient is feeling better than yesterday and has increased ambulation.  Blood pressure medications have been adjusted which unfortunately resulted in worsened hypertension.    BP (!) 160/82   Pulse 93   Temp 36.8 C (98.3 F)   Resp 18   Ht 1.702 m (5\' 7" )   Wt (!) 137.7 kg (303 lb 8 oz)   SpO2 93%   BMI 47.53 kg/m      Labs reviewed    PE:  No acute distress.  Incisions are clean dry and intact.  On Q pain ball was removed today as was dressing over left lower quadrant incision.    Acute kidney injury:  Appears to be resolving, was most likely due to hypovolemia following surgery  Chronic kidney disease:  Medications have been adjusted by Nephrology, appreciate consultation.  Hypertension:  Patient will require another day of hospitalization to adjust medications.  Appreciate hospitalist consultation and management  Blood loss anemia:  Patient remains asymptomatic.  I will recheck a CBC in the morning to ensure there is no continuing blood loss.  CT scan yesterday was reassuring that there was no active bleeding.  Patient encouraged to maintain a higher diet, but I will not prescribe iron supplements due to the side effect of constipation.      Continue ambulation, incentive spirometry, and DVT prophylaxis.    Ruta Hinds, MD

## 2017-10-27 NOTE — Nurses Notes (Signed)
Call placed to hospitalist, Dr. Edwina Barth regarding patient's morning lab work. And reassessment of BP. No telephone orders received, Dr. Edwina Barth to review and place medication orders. Page to Dr. Kerin Perna.

## 2017-10-27 NOTE — Progress Notes (Signed)
[facility]  [place], [place] [address]    IP PROGRESS NOTE-Consult    Shellee Milo DNP, FNP-BC      Rhae Hammock  Date of Admission:  10/24/2017  Date of Birth:  1966-07-31  Date of Service:  10/27/2017    Chief Complaint: post-op abdominal pain  Subjective: Above noted; no new concerns    Vital Signs:  Temp (24hrs) Max:37.3 C (99.2 F)      Temperature: 36.8 C (98.3 F)  BP (Non-Invasive): (!) 160/82  MAP (Non-Invasive): 102 mmHG  Heart Rate: 93  Respiratory Rate: 18  Pain Score (Numeric, Faces): 1  SpO2-1: 93 %    Current Medications:    Current Facility-Administered Medications:  [START ON 10/28/2017] amLODIPine (NORVASC) tablet 5 mg Oral Daily   bupivacaine (MARCAINE) 0.25% local infiltration ON-Q pump 270 mL Local Infiltration Continuous   SSIP insulin lispro (HUMALOG) 100 units/mL injection 2-12 Units Subcutaneous 4x/day AC   And      dextrose 50% (0.5 g/mL) injection - syringe 12.5 g Intravenous Q15 Min PRN   diphenhydrAMINE (BENADRYL) capsule 25 mg Oral Q6H PRN   docusate sodium (COLACE) capsule 100 mg Oral 2x/day   glipiZIDE (GLUCOTROL XL) extended release tablet 5 mg Oral Daily with Breakfast   losartan (COZAAR) tablet 50 mg Oral Daily   magnesium hydroxide (MILK OF MAGNESIA) 400mg  per 36mL oral liquid 30 mL Oral Daily PRN   morphine 4 mg/mL injection 2 mg Intravenous Q2H PRN   oxyCODONE-acetaminophen (PERCOCET) 5-325mg  per tablet 1 Tab Oral Q4H PRN   oxyCODONE-acetaminophen (PERCOCET) 5-325mg  per tablet 2 Tab Oral Q4H PRN   pravastatin (PRAVACHOL) tablet 10 mg Oral Daily after Breakfast   prochlorperazine (COMPAZINE) 5 mg/mL injection 10 mg Intravenous Q6H PRN       Today's Physical Exam:   General: appearsmorbidly obese andin fair health. Acutely ill but in noacute distress.  Eyes: Pupils equal and round, reactive to light   HENT: Head atraumatic and normocephalic  Neck: No JVD or thyromegaly or lymphadenopathy  Lungs: CTAB, non labored breathing, no rales or  wheezingbut auscultation is somewhat hindered by patient's obese body habitus  Cardiovascular: regular rate and rhythm, S1, S2 normal, no murmur   Abdomen:morbidly obese, surgically tender; incision c/d/i with some ecchymosis of the inferior lateral site; unable to auscultate bowel sounds but auscultation is hindered by obese body habitus  Extremities: extremities normal, atraumatic, no cyanosis or edema  Skin: Skin warm and dry  Neurologic: Grossly normal  Psychiatric:flataffect, appropriate and cooperative         I/O:  I/O last 24 hours:      Intake/Output Summary (Last 24 hours) at 10/27/2017 1356  Last data filed at 10/27/2017 6834  Gross per 24 hour   Intake 600 ml   Output 2650 ml   Net -2050 ml     I/O current shift:  02/02 0700 - 02/02 1859  In: 240 [P.O.:240]  Out: 400 [Urine:400]      Labs  Please indicate ordered or reviewed)  Reviewed:   I have reviewed all lab results.  Lab Results for Last 24 Hours:    Results for orders placed or performed during the hospital encounter of 10/24/17 (from the past 24 hour(s))   PROTEIN/CREATININE RATIO, URINE, RANDOM   Result Value Ref Range    PROTEIN RANDOM URINE 36 (H) <10 mg/dL mg/dL    CREATININE RANDOM URINE 63 No Reference Range Established mg/dL    PROTEIN/CREATININE RATIO RANDOM URINE  0.571 (H) <=0.200 mg/mg   POC FINGERSTICK GLUCOSE - [facility] (RESULTS)   Result Value Ref Range    GLUCOSE, POC 116 (H) 60 - 100 mg/dl   POC FINGERSTICK GLUCOSE - [facility] (RESULTS)   Result Value Ref Range    GLUCOSE, POC 113 (H) 60 - 100 mg/dl   POC FINGERSTICK GLUCOSE - [facility] (RESULTS)   Result Value Ref Range    GLUCOSE, POC 127 (H) 60 - 100 mg/dl   CBC W/NO DIFF - [facility] ONLY   Result Value Ref Range    WBC 8.1 4.0 - 11.0 x10^3/uL    RBC 3.21 (L) 4.00 - 5.10 x10^6/uL    HGB 9.4 (L) 12.0 - 15.5 g/dL    HCT 28.1 (L) 36.0 - 45.0 %    MCV 87.6 82.0 - 97.0 fL    MCH 29.2 27.5 - 33.2 pg    MCHC 33.4 32.0 - 36.0 g/dL    RDW 14.3 11.0 - 16.0 %    PLATELETS 206 150 - 450 x10^3/uL       MPV 9.4 7.4 - 10.5 fL   BASIC METABOLIC PANEL   Result Value Ref Range    SODIUM 134 (L) 136 - 145 mmol/L    POTASSIUM 4.2 3.4 - 5.1 mmol/L    CHLORIDE 102 101 - 111 mmol/L    CO2 TOTAL 22 22 - 32 mmol/L    ANION GAP 10 3 - 11 mmol/L    CALCIUM 8.4 (L) 8.6 - 10.3 mg/dL    GLUCOSE 198 (H) 70 - 110 mg/dL    BUN 30 (H) 6 - 20 mg/dL    CREATININE 1.77 (H) 0.44 - 1.00 mg/dL    BUN/CREA RATIO 17 6 - 22    ESTIMATED GFR 30 (L) >60 mL/min/1.71m^2   POC FINGERSTICK GLUCOSE - [facility] (RESULTS)   Result Value Ref Range    GLUCOSE, POC 142 (H) 60 - 100 mg/dl           Radiology Tests (Please indicate ordered or reviewed)  Interpreted by radiologist and individually reviewed by me: N/A      Problem List:  Active Hospital Problems   (*Primary Problem)    Diagnosis   . 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 Morbid obesity   . AKI (acute kidney injury) (CMS HCC)   . Essential hypertension     Chronic     Not at goal. Losartan increased already. Will add Amlodipine 5. Goal <120/80     . Type 2 diabetes mellitus (CMS HCC)     Chronic     Control much improved - A1C 5.2% in January. Continue current regimen         Assessment/ Plan:   Post-op pain:   -S/P partial nephrectomy, laparoscopic done 10/24/17 (due to renal mass by Dr. Gabriela Eves   -defer specific post-op care to same   -routine post-op care to include turn, cough, deep breathe (encourage IS); mobility as soon as ok with surgeon    AKI:   -serum BUN35; Cr+2.40; FBP10   -likely secondary to renal mass and surgery  PLAN   -avoid unnecessary nephrotoxins   -renal dose meds when indicated   -nephrology is following patient    DM II, uncontrolled:   -glucose 100-120s for past 24 hours   -refuses insulin coverage   -HA1C 6.7 this admission  PLAN   -continue glipizide   -  continue FSBS and encourage SSIP   -diabetic diet    HTN:   -BP not at goal   - 570V systolic  PLAN   -losartan 50 mg and amlodipine 5  mg daily   -monitor BP trends    HLD:   -no immediate issues  PLAN   -continue pravastatin as at home    Morbid obesity:   -BMI 47.63   -weight loss discussed  PLAN   -continue to provide education concerning healthy lifestyle changes for weight reduction      Overall PLAN  Monitor BP to see that it is coming down with addition of home antihypertensives  Encourage insulin coverage to control glucose and promote adequate healing  Surgery plans to DC patient in morning    Disposition:   Discharge will be pending test results, need for further testing, or further complications.    Shellee Milo DNP, FNP-BC

## 2017-10-27 NOTE — Discharge Summary (Signed)
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 Morbid obesity 10/24/2017   . AKI (acute kidney injury) (CMS [place]) 10/08/2017   . Essential hypertension 11/30/2014   . Type 2 diabetes mellitus (CMS [facility]) 10/25/2014      Resolved Hospital Problems   No resolved problems to display.     Active Non-Hospital Problems    Diagnosis Date Noted   . Hyperkalemia 10/08/2017   . Slow transit constipation 10/08/2017   . C. difficile diarrhea 12/02/2014   . Obesity 11/30/2014   . Cellulitis of foot, left 11/29/2014   . Diabetic foot infection (CMS [place]) 10/24/2014      Allergies   Allergen Reactions   . Vicodin [Hydrocodone-Acetaminophen] Itching            DISCHARGE MEDICATIONS:     Current Discharge Medication List      START taking these medications.      Details   oxyCODONE-acetaminophen 5-325 mg Tablet  Commonly known as:  PERCOCET   1-2 Tabs, Oral, EVERY 4 HOURS PRN, Do not exceed 6 tabs per day  Qty:  20 Tab  Refills:  0        CONTINUE these medications - NO CHANGES were made during your visit.      Details   amLODIPine 2.5 mg Tablet  Commonly known as:  NORVASC   2.5 mg, Oral, DAILY  Refills:  0     CLARITIN 10 mg Tablet  Generic drug:  loratadine   10 mg, Oral, DAILY PRN  Refills:  0     fluticasone 50 mcg/actuation Spray, Suspension  Commonly known as:  FLONASE   1 Spray, Each Nostril, DAILY  Refills:  0     glipiZIDE 5 mg Tablet  Commonly known as:  GLUCOTROL   2.5 mg, Oral,  2 TIMES DAILY BEFORE MEALS, Take 30 minutes before meals  Refills:  0     loperamide 2 mg Capsule  Commonly known as:  IMODIUM   2 mg, Oral, EVERY 4 HOURS PRN  Refills:  0     magnesium oxide 400 mg (241.3 mg magnesium) Tablet  Commonly known as:  MAG-OX   400 mg, Oral, 2 TIMES DAILY  Refills:  0     pravastatin 10 mg Tablet  Commonly known as:  PRAVACHOL   10 mg, Oral, EVERY EVENING  Refills:  0            DISCHARGE INSTRUCTIONS:      DISCHARGE INSTRUCTION - DIET     Diet: RESUME HOME DIET      SCHEDULE FOLLOW-UP [facility]     Follow-up in: 1 MONTH    Reason for visit: POST-OP VISIT      Follow-up Information     [facility] [place].    Specialty:  Urology  Contact information:  [place] [id number]  [phone]                 REASON FOR HOSPITALIZATION AND HOSPITAL COURSE:  This is a 52 y.o., female admitted the morning of surgery and underwent an uncomplicated left laparoscopic partial nephrectomy.  She was treated with IV fluids overnight, but had low urine output and required IV fluid bolus the following evening and urine output was normal afterwards.  Her CBC trended downwards for 2 days, but then stabilized.  A CT scan was performed which did not demonstrate evidence of active bleeding or any large hematomas.  Her renal function also worsened for the 1st 2 days, but then stabilized.  She was treated with DVT prophylaxis, incentive spirometry, and assistance with ambulation.  Over the course of the hospitalization she was able to tolerate p.o. Intake without nausea or vomiting and by discharge was able to ambulate without assistance and tolerate her pain with oral medications only.  Her pathology revealed clear cell renal cell carcinoma with negative margins and no high-risk features.  After adjusting her blood pressure medications for her renal insufficiency, or she became significantly hypertensive required an extra day of hospitalization to adjust blood pressure  medications to improve this issue.    SIGNIFICANT PHYSICAL FINDINGS:  Ecchymosis on the left abdomen  SIGNIFICANT LAB:  CBC and renal function test as above  SIGNIFICANT RADIOLOGY:  CT scan showed normal postoperative findings at the renal resection site with no evidence of large hematoma or active bleeding  CONSULTATIONS:  Hospitalist and Nephrology consultations appreciated  PROCEDURES PERFORMED:  Left laparoscopic partial nephrectomy        [place]:  As above    DOES PATIENT HAVE ADVANCED DIRECTIVES:  No, Information Offered and Given    ADVANCED CARE PLANNING - Not applicable for this patient    CONDITION ON DISCHARGE: Alert, Oriented and VS Stable    DISCHARGE DISPOSITION:  Home discharge     Copies sent to Care Team       Relationship Specialty Notifications Start End    Candee Furbish, MD PCP - [facility]  10/24/14     Phone: [phone] Fax: [phone]         [address] [place] [address]

## 2017-10-27 NOTE — Nurses Notes (Addendum)
10/27/17 0800   Vital Signs   Temperature 37 C (98.6 F)   Temp Source Oral   Heart Rate (!) 105   Respiratory Rate 20   BP (Non-Invasive) (!) 173/101   BP Source (Non-Invasive) C;Monitor;RA   Patient Position Sitting   Oxygen Therapy   SpO2-1 94 %   $ O2 Delivery RA   Patient had transitioned from a semi fowler to sitting position for her breakfast. Will reassess BP after patient has time to sit in this position and rest.   Denies head ache, denies feelings of edema to peripheral extremities. Trace edema to ankles.

## 2017-10-28 LAB — BASIC METABOLIC PANEL
ANION GAP: 7 mmol/L (ref 3–11)
BUN/CREA RATIO: 16 (ref 6–22)
BUN: 27 mg/dL — ABNORMAL HIGH (ref 6–20)
CALCIUM: 8.6 mg/dL (ref 8.6–10.3)
CHLORIDE: 101 mmol/L (ref 101–111)
CO2 TOTAL: 25 mmol/L (ref 22–32)
CREATININE: 1.64 mg/dL — ABNORMAL HIGH (ref 0.44–1.00)
ESTIMATED GFR: 33 mL/min/1.73mˆ2 — ABNORMAL LOW (ref >60)
GLUCOSE: 99 mg/dL (ref 70–110)
POTASSIUM: 3.8 mmol/L (ref 3.4–5.1)
SODIUM: 133 mmol/L — ABNORMAL LOW (ref 136–145)

## 2017-10-28 LAB — CBC W/NO DIFF
HGB: 9.3 g/dL — ABNORMAL LOW (ref 12.0–15.5)
MCV: 88.2 fL (ref 82.0–97.0)
MPV: 9.4 fL (ref 7.4–10.5)
RBC: 3.16 x10ˆ6/uL — ABNORMAL LOW (ref 4.00–5.10)
RDW: 13.8 % (ref 11.0–16.0)
RDW: 13.8 % (ref 11.0–16.0)

## 2017-10-28 LAB — POC FINGERSTICK GLUCOSE - BMC/JMC (RESULTS): GLUCOSE, POC: 107 mg/dl — ABNORMAL HIGH (ref 60–100)

## 2017-10-28 NOTE — Discharge Instructions (Signed)
Weekend discharge, patient to schedule follow up appointment.

## 2017-10-28 NOTE — Care Plan (Signed)
Pt had difficulty getting comfortable for sleep due to discomfort in both bed and chair, not from surgery but on buttocks. States it is too painful in her surgical site to roll off the affected area. PO percocet given x2 per MAR. Pt able to sleep after second dose. Pt up ad lib within room. Incisions clean, dry, well approximated. Ecchymosis on LLQ of abdomen increased from last evening. Bowel sounds increased, pt states she is passing more gas. No BM since prior to surgery. Accucheck 124 at bedtime; pt refusing insulin coverage.      Problem: Adult Inpatient Plan of Care  Goal: Plan of Care Review  Outcome: Ongoing (see interventions/notes)  Goal: Patient-Specific Goal (Individualization)  Outcome: Ongoing (see interventions/notes)  Goal: Absence of Hospital-Acquired Illness or Injury  Outcome: Ongoing (see interventions/notes)  Goal: Optimal Comfort and Wellbeing  Outcome: Ongoing (see interventions/notes)  Goal: Rounds/Family Conference  Outcome: Ongoing (see interventions/notes)     Problem: Fall Injury Risk  Goal: Absence of Fall and Fall-Related Injury  Outcome: Ongoing (see interventions/notes)     Problem: Diabetes Comorbidity  Goal: Blood Glucose Level Within Desired Range  Outcome: Ongoing (see interventions/notes)     Problem: Bleeding (Surgery Nonspecified)  Goal: Absence of Bleeding  Outcome: Ongoing (see interventions/notes)     Problem: Bowel Elimination Impaired (Surgery Nonspecified)  Goal: Effective Bowel Elimination  Outcome: Ongoing (see interventions/notes)     Problem: Infection (Surgery Nonspecified)  Goal: Absence of Infection Signs/Symptoms  Outcome: Ongoing (see interventions/notes)     Problem: Pain (Surgery Nonspecified)  Goal: Acceptable Pain Control  Outcome: Ongoing (see interventions/notes)     Problem: Pain Acute  Goal: Optimal Pain Control  Outcome: Ongoing (see interventions/notes)

## 2017-10-28 NOTE — Nurses Notes (Signed)
Lab at bedside, ordered CBC collected. Morning CBC was reported to have been "hemolyzed".

## 2017-10-28 NOTE — Progress Notes (Signed)
[facility]  [place], [place] [address]    IP PROGRESS NOTE-Consult    Shellee Milo DNP, FNP-BC      Rhae Hammock  Date of Admission:  10/24/2017  Date of Birth:  December 14, 1965  Date of Service:  10/28/2017    Chief Complaint: "When doctor Kerin Perna comes in I think I am going home"  Subjective: sitting up in chair, daughter at her side, above comment noted.     Vital Signs:  Temp (24hrs) Max:37 C (98.6 F)      Temperature: 36.7 C (98.1 F)  BP (Non-Invasive): (!) 176/91  MAP (Non-Invasive): 102 mmHG  Heart Rate: 80  Respiratory Rate: 18  Pain Score (Numeric, Faces): 3  SpO2-1: 100 %    Current Medications:    Current Facility-Administered Medications:  amLODIPine (NORVASC) tablet 5 mg Oral Daily   bupivacaine (MARCAINE) 0.25% local infiltration ON-Q pump 270 mL Local Infiltration Continuous   SSIP insulin lispro (HUMALOG) 100 units/mL injection 2-12 Units Subcutaneous 4x/day AC   And      dextrose 50% (0.5 g/mL) injection - syringe 12.5 g Intravenous Q15 Min PRN   diphenhydrAMINE (BENADRYL) capsule 25 mg Oral Q6H PRN   docusate sodium (COLACE) capsule 100 mg Oral 2x/day   glipiZIDE (GLUCOTROL XL) extended release tablet 5 mg Oral Daily with Breakfast   losartan (COZAAR) tablet 50 mg Oral Daily   magnesium hydroxide (MILK OF MAGNESIA) 400mg  per 69mL oral liquid 30 mL Oral Daily PRN   morphine 4 mg/mL injection 2 mg Intravenous Q2H PRN   oxyCODONE-acetaminophen (PERCOCET) 5-325mg  per tablet 1 Tab Oral Q4H PRN   oxyCODONE-acetaminophen (PERCOCET) 5-325mg  per tablet 2 Tab Oral Q4H PRN   pravastatin (PRAVACHOL) tablet 10 mg Oral Daily after Breakfast   prochlorperazine (COMPAZINE) 5 mg/mL injection 10 mg Intravenous Q6H PRN       Today's Physical Exam:   General: appearsmorbidly obese andin fair health. Acutely ill but in noacute distress.  Eyes: Pupils equal and round, reactive to light   HENT: Head atraumatic and normocephalic  Neck: No JVD or thyromegaly or  lymphadenopathy  Lungs: CTAB, non labored breathing, no rales or wheezingbut auscultation is somewhat hindered by patient's obese body habitus  Cardiovascular: regular rate and rhythm, S1, S2 normal, no murmur   Abdomen:morbidly obese, surgically tender; incision c/d/i with some ecchymosis of the inferior lateral site; unable to auscultate bowel sounds but auscultation is hindered by obese body habitus  Extremities: extremities normal, atraumatic, no cyanosis or edema  Skin: Skin warm and dry  Neurologic: Grossly normal  Psychiatric:flataffect, appropriate and cooperative        I/O:  I/O last 24 hours:      Intake/Output Summary (Last 24 hours) at 10/28/2017 1044  Last data filed at 10/28/2017 1000  Gross per 24 hour   Intake 1320 ml   Output 2200 ml   Net -880 ml     I/O current shift:  02/03 0700 - 02/03 1859  In: 240 [P.O.:240]  Out: 700 [Urine:700]      Labs  Please indicate ordered or reviewed)  Reviewed:   I have reviewed all lab results.  Lab Results for Last 24 Hours:    Results for orders placed or performed during the hospital encounter of 10/24/17 (from the past 24 hour(s))   POC FINGERSTICK GLUCOSE - [facility] (RESULTS)   Result Value Ref Range    GLUCOSE, POC 142 (H) 60 - 100 mg/dl   POC FINGERSTICK GLUCOSE -  [facility] (RESULTS)   Result Value Ref Range    GLUCOSE, POC 98 60 - 100 mg/dl   POC FINGERSTICK GLUCOSE - [facility] (RESULTS)   Result Value Ref Range    GLUCOSE, POC 123 (H) 60 - 100 mg/dl   BASIC METABOLIC PANEL   Result Value Ref Range    SODIUM 133 (L) 136 - 145 mmol/L    POTASSIUM 3.8 3.4 - 5.1 mmol/L    CHLORIDE 101 101 - 111 mmol/L    CO2 TOTAL 25 22 - 32 mmol/L    ANION GAP 7 3 - 11 mmol/L    CALCIUM 8.6 8.6 - 10.3 mg/dL    GLUCOSE 99 70 - 110 mg/dL    BUN 27 (H) 6 - 20 mg/dL    CREATININE 1.64 (H) 0.44 - 1.00 mg/dL    BUN/CREA RATIO 16 6 - 22    ESTIMATED GFR 33 (L) >60 mL/min/1.107m^2   POC FINGERSTICK GLUCOSE - [facility] (RESULTS)   Result Value Ref Range    GLUCOSE, POC 107 (H) 60 - 100  mg/dl   CBC W/NO DIFF - [facility] ONLY   Result Value Ref Range    WBC 7.0 4.0 - 11.0 x10^3/uL    RBC 3.16 (L) 4.00 - 5.10 x10^6/uL    HGB 9.3 (L) 12.0 - 15.5 g/dL    HCT 27.8 (L) 36.0 - 45.0 %    MCV 88.2 82.0 - 97.0 fL    MCH 29.6 27.5 - 33.2 pg    MCHC 33.6 32.0 - 36.0 g/dL    RDW 13.8 11.0 - 16.0 %    PLATELETS 229 150 - 450 x10^3/uL    MPV 9.4 7.4 - 10.5 fL           Radiology Tests (Please indicate ordered or reviewed)  Interpreted by radiologist and individually reviewed by me: N/A      Problem List:  Active Hospital Problems   (*Primary Problem)    Diagnosis   . 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 Morbid obesity   . AKI (acute kidney injury) (CMS HCC)   . Essential hypertension     Chronic     Not at goal. Losartan increased already. Will add Amlodipine 5. Goal <120/80     . Type 2 diabetes mellitus (CMS HCC)     Chronic     Control much improved - A1C 5.2% in January. Continue current regimen         Assessment/ Plan:   Post-op pain:                -S/P partial nephrectomy, laparoscopic done 10/24/17 (due to renal mass- Dr. Kerin Perna)  PLAN                -defer specific post-op care to same               -routine post-op care to include turn, cough, deep breathe (encourage IS); mobility as soon as ok with surgeon    AKI:                -serum BUN27; Cr+1.64; YBO17               -likely secondary to renal mass and surgery  PLAN               -avoid unnecessary nephrotoxins               -  renal dose meds when indicated               -nephrology is following patient    DM II, controlled:                -glucose100-120s for past 24 hours                -refuses insulin coverage                -HA1C6.7 this admission  PLAN                -continue glipizide                -continue FSBS and encourage SSIP                -diabetic diet    HTN:                -BP not at goal                - 856D systolic  PLAN                -losartan 50 mg  and amlodipine 5 mg daily                -monitor BP trends   -nephrology adjusting medications  HLD:                -no immediate issues  PLAN                -continue pravastatin as at home    Morbid obesity:                -BMI 47.63                -weight loss discussed  PLAN                -continue to provide education concerning healthy lifestyle changes for weight reduction      Overall PLAN  Monitor BP to see that it is coming down with addition of home antihypertensives  Encourage insulin coverage to control glucose and promote adequate healing  Surgery plans to DC patient today pending H&H trend    Disposition:   Discharge will be pending test results, need for further testing, or further complications.    Shellee Milo DNP, FNP-BC

## 2017-10-28 NOTE — Nurses Notes (Signed)
Dr. Kerin Perna paged with H&H results.

## 2017-10-28 NOTE — Nurses Notes (Signed)
Discharge entered by Dr. Kerin Perna. Patient notified. Patient has prescriptions given to her prior from Dr. Kerin Perna.

## 2017-11-08 ENCOUNTER — Other Ambulatory Visit: Payer: Self-pay

## 2017-11-21 ENCOUNTER — Ambulatory Visit (HOSPITAL_BASED_OUTPATIENT_CLINIC_OR_DEPARTMENT_OTHER): Payer: No Typology Code available for payment source | Attending: Nephrology

## 2017-11-21 DIAGNOSIS — C801 Malignant (primary) neoplasm, unspecified: Secondary | ICD-10-CM | POA: Insufficient documentation

## 2017-11-21 DIAGNOSIS — D631 Anemia in chronic kidney disease: Secondary | ICD-10-CM | POA: Insufficient documentation

## 2017-11-21 DIAGNOSIS — I10 Essential (primary) hypertension: Secondary | ICD-10-CM

## 2017-11-21 DIAGNOSIS — E1122 Type 2 diabetes mellitus with diabetic chronic kidney disease: Secondary | ICD-10-CM | POA: Insufficient documentation

## 2017-11-21 DIAGNOSIS — E1121 Type 2 diabetes mellitus with diabetic nephropathy: Secondary | ICD-10-CM

## 2017-11-21 DIAGNOSIS — N184 Chronic kidney disease, stage 4 (severe): Secondary | ICD-10-CM | POA: Insufficient documentation

## 2017-11-21 DIAGNOSIS — N2581 Secondary hyperparathyroidism of renal origin: Secondary | ICD-10-CM | POA: Insufficient documentation

## 2017-11-21 DIAGNOSIS — R809 Proteinuria, unspecified: Secondary | ICD-10-CM | POA: Insufficient documentation

## 2017-11-21 DIAGNOSIS — C642 Malignant neoplasm of left kidney, except renal pelvis: Secondary | ICD-10-CM | POA: Insufficient documentation

## 2017-11-21 DIAGNOSIS — N189 Chronic kidney disease, unspecified: Secondary | ICD-10-CM

## 2017-11-21 DIAGNOSIS — I420 Dilated cardiomyopathy: Secondary | ICD-10-CM | POA: Insufficient documentation

## 2017-11-21 DIAGNOSIS — I129 Hypertensive chronic kidney disease with stage 1 through stage 4 chronic kidney disease, or unspecified chronic kidney disease: Secondary | ICD-10-CM | POA: Insufficient documentation

## 2017-11-21 LAB — COMPREHENSIVE METABOLIC PROFILE - BMC/JMC ONLY
ALBUMIN/GLOBULIN RATIO: 1.1 (ref 0.8–2.0)
ALBUMIN: 3.4 g/dL — ABNORMAL LOW (ref 3.5–5.0)
ALKALINE PHOSPHATASE: 81 U/L (ref 38–126)
ALT (SGPT): 24 U/L (ref 14–54)
ANION GAP: 5 mmol/L (ref 3–11)
AST (SGOT): 22 U/L (ref 15–41)
BILIRUBIN TOTAL: 0.5 mg/dL (ref 0.3–1.2)
BUN/CREA RATIO: 17 (ref 6–22)
BUN: 24 mg/dL — ABNORMAL HIGH (ref 6–20)
CALCIUM: 9.3 mg/dL (ref 8.6–10.3)
CHLORIDE: 106 mmol/L (ref 101–111)
CHLORIDE: 106 mmol/L (ref 101–111)
CO2 TOTAL: 25 mmol/L (ref 22–32)
CREATININE: 1.38 mg/dL — ABNORMAL HIGH (ref 0.44–1.00)
ESTIMATED GFR: 41 mL/min/1.73mˆ2 — ABNORMAL LOW (ref >60)
GLUCOSE: 192 mg/dL — ABNORMAL HIGH (ref 70–110)
POTASSIUM: 4.3 mmol/L (ref 3.4–5.1)
PROTEIN TOTAL: 6.5 g/dL (ref 6.4–8.3)
SODIUM: 136 mmol/L (ref 136–145)

## 2017-11-21 LAB — RENAL FUNCTION PANEL
ALBUMIN: 3.4 g/dL — ABNORMAL LOW (ref 3.5–5.0)
ANION GAP: 5 mmol/L (ref 3–11)
CO2 TOTAL: 25 mmol/L (ref 22–32)
CO2 TOTAL: 25 mmol/L (ref 22–32)
CREATININE: 1.38 mg/dL — ABNORMAL HIGH (ref 0.44–1.00)
GLUCOSE: 192 mg/dL — ABNORMAL HIGH (ref 70–110)
PHOSPHORUS: 3.5 mg/dL (ref 2.7–4.5)
SODIUM: 136 mmol/L (ref 136–145)

## 2017-11-21 LAB — CBC WITH DIFF
BASOPHIL #: 0.1 x10ˆ3/uL (ref 0.00–0.10)
BASOPHIL %: 1 % (ref 0–3)
EOSINOPHIL #: 0.1 10*3/uL (ref 0.00–0.50)
EOSINOPHIL %: 2 % (ref 0–5)
HCT: 34.8 % — ABNORMAL LOW (ref 36.0–45.0)
HGB: 12 g/dL (ref 12.0–15.5)
LYMPHOCYTE #: 1.5 x10ˆ3/uL (ref 1.00–4.80)
LYMPHOCYTE %: 27 % (ref 15–43)
MCH: 29.4 pg (ref 27.5–33.2)
MCHC: 34.4 g/dL (ref 32.0–36.0)
MCV: 85.6 fL (ref 82.0–97.0)
MONOCYTE #: 0.4 x10ˆ3/uL (ref 0.20–0.90)
MONOCYTE %: 6 % (ref 5–12)
MPV: 9.1 fL (ref 7.4–10.5)
NEUTROPHIL #: 3.5 x10ˆ3/uL (ref 1.50–6.50)
NEUTROPHIL %: 63 % (ref 43–76)
PLATELETS: 262 x10ˆ3/uL (ref 150–450)
RBC: 4.07 10*6/uL (ref 4.00–5.10)
RDW: 14.4 % (ref 11.0–16.0)
WBC: 5.5 x10ˆ3/uL (ref 4.0–11.0)

## 2017-11-21 LAB — TRANSFERRIN SATURATION PANEL
IRON (TRANSFERRIN) SATURATION: 11 % — ABNORMAL LOW (ref 15–50)
IRON: 44 ug/dL (ref 28–170)
TOTAL IRON BINDING CAPACITY: 403 ug/dL
TRANSFERRIN: 282 mg/dL (ref 192–282)

## 2017-11-21 LAB — PARATHYROID HORMONE (PTH): PTH: 79.2 pg/mL (ref 12.0–88.0)

## 2017-11-21 LAB — FERRITIN: FERRITIN: 58 ng/mL (ref 11–307)

## 2017-11-22 ENCOUNTER — Encounter (INDEPENDENT_AMBULATORY_CARE_PROVIDER_SITE_OTHER): Payer: Self-pay | Admitting: Urology

## 2017-11-22 ENCOUNTER — Ambulatory Visit (INDEPENDENT_AMBULATORY_CARE_PROVIDER_SITE_OTHER): Payer: No Typology Code available for payment source | Admitting: Urology

## 2017-11-22 DIAGNOSIS — C642 Malignant neoplasm of left kidney, except renal pelvis: Secondary | ICD-10-CM

## 2017-11-22 LAB — VITAMIN D 25, TOTAL: VITAMIN D, 25OH: 12 ng/mL — ABNORMAL LOW (ref 30–100)

## 2017-11-22 NOTE — Progress Notes (Signed)
Urology Progress note      Chief Complaint   Patient presents with   . Renal Mass   . Follow Up   . Post Op        [name] is a 53 y.o. female presents for follow-up after partial nephrectomy last month.  She developed worse renal insufficiency than expected after surgery, but it has recovered significantly since then but is still not normal.  Her CBC had dropped after surgery as well, but has also nearly completely recovered.  Patient reports only some occasional incisional pain with certain movements, but otherwise is pain free.    BP (!) 198/119   Pulse 99   Temp 36 C (96.8 F) (Thermal Scan)   Ht 1.702 m (5\' 7" )   Wt 131.9 kg (290 lb 12.8 oz)   BMI 45.55 kg/m        PE:  Incisions have some minor scabbing remaining, but do not have fluctuance, induration, erythema, or drainage.      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  I reviewed pathology with the patient again and reviewed NCCN guidelines for follow-up.  Plan for follow-up in 6 months with a CMP.  At that point we will decide what sort of imaging to perform either CT scan, MRI, or ultrasound sometime in the 1st year.       Ruta Hinds, MD

## 2017-11-22 NOTE — Progress Notes (Signed)
[facility]    MULTIDISCIPLINARY TUMOR BOARD DISCUSSION:    PATIENT:   [name]  DOB:    1966-01-30  AGE:   52 y.o.  DATE:   11/08/2017    REFERRING PROVIDER: No ref. provider found  PCP: Candee Furbish, MD    TYPE OF PRESENTATION: Prospective  PATHOLOGY REVIEWED AT [facility]?: Yes  RADIOGRAPHS REVIEWED AT [facility]?: Yes  NATIONAL GUIDELINES DISCUSSED?: Yes; NCCN    DIAGNOSIS: Clear Cell Carcinoma of Kidney  DIAGNOSIS LOCATION: [facility]  DIAGNOSIS METHOD: Imaging  CLINICAL STAGE:  Stage 1, T1, N0, M0  RECURRENT?: No    FAMILY HISTORY?: Yes  PROGNOSTIC INDICATORS DISCUSSED: Yes    TUMOR BOARD DISCUSSION SUMMARY:  [name], was reviewed at Tumor Board on November 08, 2017. The patient is a 52 year old female who presented to the emergency room with right flank pain. She underwent a CT scan which demonstrated a mass in the left kidney. This mass was highly suspicious for renal cell carcinoma. She subsequently underwent partial nephrectomy. Pathology showed clear cell carcinoma, measuring 3.1 cm. There was no evidence of venous invasion and margins of resection are clear.     TUMOR BOARD RECOMMENDATION:  Tumor Board concensus was that this patient required no adjuvant chemotherapy at this point, but should be observed carefully.       Greig Right, MD

## 2017-11-22 NOTE — Assessment & Plan Note (Signed)
CBC has recovered almost completely and renal function has improved dramatically as well.  I reviewed pathology with the patient again and reviewed NCCN guidelines for follow-up.  Plan for follow-up in 6 months with a CMP.  At that point we will decide what sort of imaging to perform either CT scan, MRI, or ultrasound sometime in the 1st year.

## 2018-04-09 ENCOUNTER — Encounter: Payer: Self-pay | Admitting: Family Medicine

## 2018-04-09 DIAGNOSIS — Z1239 Encounter for other screening for malignant neoplasm of breast: Secondary | ICD-10-CM

## 2018-06-10 ENCOUNTER — Encounter (INDEPENDENT_AMBULATORY_CARE_PROVIDER_SITE_OTHER): Payer: Self-pay | Admitting: Urology

## 2018-06-10 ENCOUNTER — Ambulatory Visit (INDEPENDENT_AMBULATORY_CARE_PROVIDER_SITE_OTHER): Payer: PRIVATE HEALTH INSURANCE | Admitting: Urology

## 2018-06-10 VITALS — BP 171/96 | HR 105 | Temp 97.4°F | Ht 67.0 in | Wt 302.4 lb

## 2018-06-10 DIAGNOSIS — C642 Malignant neoplasm of left kidney, except renal pelvis: Secondary | ICD-10-CM

## 2018-06-10 DIAGNOSIS — R600 Localized edema: Secondary | ICD-10-CM

## 2018-06-10 DIAGNOSIS — Z905 Acquired absence of kidney: Secondary | ICD-10-CM

## 2018-06-10 DIAGNOSIS — N289 Disorder of kidney and ureter, unspecified: Secondary | ICD-10-CM

## 2018-06-10 NOTE — Assessment & Plan Note (Addendum)
Doing well after surgery.  Plan for a renal ultrasound sometime in the next 6 months.  Plan to continue surveillance as per NCCN guidelines with H&P and labs (CBC, CMP) every 6 months for the 1st 2 years, and then yearly up to 5 years.  She should also get a chest x-ray once a year.

## 2018-06-10 NOTE — Progress Notes (Signed)
[facility]  [address]  [place], [place] [address]    Return Office Visit    SUBJECTIVE:    Patient ID:   [name], 52 y.o., female       Chief Complaint:   Chief Complaint   Patient presents with   . Kidney Cancer   . Follow Up 6 Months       History of Present Illness:    52 y.o. female presents for follow-up after partial nephrectomy in January this year.  She has continued to have renal insufficiency with a stable creatinine of about 1.7.  She also has new complaints of lower extremity edema which is being worked up by her PCP.  Her labs were otherwise normal and she has normal chest x-ray.  We will get a renal ultrasound since her kidney function is not appropriate for a contrast enhanced CT.  Denies any fever, chills, unexpected weight loss, night sweats, or other concerning symptoms.    Past Medical History:  Past Medical History:   Diagnosis Date   . Anxiety    . Arthritis    . Cellulitis 10/24/14    left foot   . 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 Depression    . Diabetes mellitus (CMS [place])    . Essential hypertension    . Fatty liver    . Headache(784.0)    . Hyperlipidemia    . Irritable bowel syndrome    . Macular edema    . Neck problem     good ROM   . Obesity    . Type 2 diabetes mellitus (CMS HCC)    . White coat hypertension            Past Surgical History:  Past Surgical History:   Procedure Laterality Date   . ECTOPIC PREGNANCY SURGERY  1999   . HX CATARACT REMOVAL Bilateral    . HX CHOLECYSTECTOMY  09/10/96   . 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 HX PELVIC LAPAROSCOPY  05/24/98    Ectopic pregnancy   . HX TUBAL LIGATION  09/10/2012    Filshie Clips   . HYMENECTOMY             Review of Systems:  Constitutional: No fever, chills, or weight loss  GU: As per HPI    Allergies:  Allergies   Allergen Reactions   . Vicodin [Hydrocodone-Acetaminophen] Itching       Home Medications:  Prior to Admission medications    Medication Sig Start  Date End Date Taking? Authorizing Provider   amLODIPine (NORVASC) 2.5 mg Oral Tablet Take 2.5 mg by mouth Once a day   Yes Provider, Historical   atorvastatin (LIPITOR) 20 mg Oral Tablet Take 20 mg by mouth Every evening   Yes Provider, Historical   fluticasone (FLONASE) 50 mcg/actuation Nasal Spray, Suspension 1 Spray by Each Nostril route Once a day   Yes Provider, Historical   furosemide (LASIX) 20 mg Oral Tablet Take 20 mg by mouth Once a day   Yes Provider, Historical   glipiZIDE (GLUCOTROL) 5 mg Oral Tablet Take 2.5 mg by mouth Twice a day before meals Take 30 minutes before meals   Yes Provider, Historical   loperamide (IMODIUM) 2 mg Oral Capsule Take 2 mg by mouth Every 4 hours as needed   Yes Provider, Historical   loratadine (CLARITIN) 10 mg Oral Tablet Take 10 mg by  mouth Once per day as needed    Yes Provider, Historical   losartan (COZAAR) 25 mg Oral Tablet Take 50 mg by mouth Once a day   Yes Provider, Historical   magnesium oxide (MAG-OX) 400 mg (241.3 mg magnesium) Oral Tablet Take 400 mg by mouth Twice daily   Yes Provider, Historical   oxyCODONE-acetaminophen (PERCOCET) 5-325 mg Oral Tablet Take 1-2 Tabs by mouth Every 4 hours as needed for Pain Do not exceed 6 tabs per day  Patient not taking: Reported on 11/22/2017 10/26/17 06/10/18  Ruta Hinds, MD   pravastatin (PRAVACHOL) 10 mg Oral Tablet Take 10 mg by mouth Every evening  06/10/18  Provider, Historical       Social History:  Social History     Tobacco Use   . Smoking status: Never Smoker   . Smokeless tobacco: Never Used   Substance Use Topics   . Alcohol use: No   . Drug use: No       Family History:  Family Medical History:     Problem Relation (Age of Onset)    Cancer Maternal Grandmother    Diabetes Father    Hypertension (High Blood Pressure) Maternal Uncle              OBJECTIVE:    Vitals:  BP (!) 171/96   Pulse (!) 105   Temp 36.3 C (97.4 F) (Thermal Scan)   Ht 1.702 m (5\' 7" )   Wt (!) 137.2 kg (302 lb 6.4 oz)   BMI 47.36 kg/m            Physical examination:  Constitutional: Appears well, no acute distress.    Respiratory: Non-labored breathing  GI: soft, not distended, no masses.   GU: No CVA tenderness, Bladder Non-tender  Musculoskeletal: Moves all extremities   Psychiatric: Mood and affect normal.     Labs:  Pertinent labs reviewed.      Imaging studies:  Pertinent imaging studies reviewed.    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  Plan for a renal ultrasound sometime in the next 6 months.  Plan to continue surveillance as per NCCN guidelines with H&P and labs (CBC, CMP) every 6 months for the 1st 2 years, and then yearly up to 5 years.  She should also get a chest x-ray once a year.      Orders Placed This Encounter   . US KIDNEY             This note was completed using voice recognition technology. Please excuse any errors that have occurred as a result and contact me if any parts of the note are unclear.

## 2018-06-17 ENCOUNTER — Other Ambulatory Visit (HOSPITAL_BASED_OUTPATIENT_CLINIC_OR_DEPARTMENT_OTHER): Payer: Self-pay | Admitting: Family Medicine

## 2018-06-17 ENCOUNTER — Ambulatory Visit (HOSPITAL_BASED_OUTPATIENT_CLINIC_OR_DEPARTMENT_OTHER)
Admission: RE | Admit: 2018-06-17 | Discharge: 2018-06-17 | Disposition: A | Discharge status: Home / Self Care | Payer: 59 | Source: Ambulatory Visit | Attending: Family Medicine | Admitting: Family Medicine

## 2018-06-17 DIAGNOSIS — M7989 Other specified soft tissue disorders: Secondary | ICD-10-CM

## 2018-06-17 DIAGNOSIS — M79604 Pain in right leg: Secondary | ICD-10-CM | POA: Insufficient documentation

## 2018-10-25 ENCOUNTER — Ambulatory Visit (HOSPITAL_BASED_OUTPATIENT_CLINIC_OR_DEPARTMENT_OTHER)
Admission: RE | Admit: 2018-10-25 | Discharge: 2018-10-25 | Disposition: A | Discharge status: Home / Self Care | Payer: 59 | Source: Ambulatory Visit | Attending: Urology | Admitting: Urology

## 2018-10-25 ENCOUNTER — Ambulatory Visit (HOSPITAL_BASED_OUTPATIENT_CLINIC_OR_DEPARTMENT_OTHER): Payer: 59

## 2018-10-25 ENCOUNTER — Other Ambulatory Visit: Payer: Self-pay

## 2018-10-25 DIAGNOSIS — C642 Malignant neoplasm of left kidney, except renal pelvis: Secondary | ICD-10-CM | POA: Insufficient documentation

## 2018-11-14 ENCOUNTER — Other Ambulatory Visit (HOSPITAL_BASED_OUTPATIENT_CLINIC_OR_DEPARTMENT_OTHER): Payer: Self-pay | Admitting: Physician Assistant

## 2018-11-14 DIAGNOSIS — Z1231 Encounter for screening mammogram for malignant neoplasm of breast: Secondary | ICD-10-CM

## 2018-11-15 ENCOUNTER — Encounter (HOSPITAL_BASED_OUTPATIENT_CLINIC_OR_DEPARTMENT_OTHER): Payer: Self-pay

## 2018-12-09 ENCOUNTER — Encounter (INDEPENDENT_AMBULATORY_CARE_PROVIDER_SITE_OTHER): Payer: Self-pay | Admitting: Urology

## 2018-12-10 ENCOUNTER — Ambulatory Visit (INDEPENDENT_AMBULATORY_CARE_PROVIDER_SITE_OTHER): Payer: 59 | Admitting: Urology

## 2018-12-10 ENCOUNTER — Encounter (INDEPENDENT_AMBULATORY_CARE_PROVIDER_SITE_OTHER): Payer: Self-pay | Admitting: Urology

## 2018-12-10 ENCOUNTER — Other Ambulatory Visit: Payer: Self-pay

## 2018-12-10 DIAGNOSIS — Z85528 Personal history of other malignant neoplasm of kidney: Secondary | ICD-10-CM

## 2018-12-10 DIAGNOSIS — C642 Malignant neoplasm of left kidney, except renal pelvis: Secondary | ICD-10-CM

## 2018-12-10 NOTE — Progress Notes (Signed)
[facility]  [address]  [place], [place] [address]    Return Office Visit    SUBJECTIVE:    Patient ID:   [name], 53 y.o., female       Chief Complaint:   Chief Complaint   Patient presents with   . Follow Up 6 Months       History of Present Illness:    53 y.o. female presents for followup for renal cell carcinoma.  She underwent left partial nephrectomy in January 2019.  She has not had any flank pain, hematuria, night sweats, unexpected weight loss, or other concerning symptoms.  She is very concerns that she was told that she may have endometrial cancer and her workup is being delayed by the corona virus.  She had a renal ultrasound, which is limited due to her body habitus, but does not show any abnormal masses on the kidney.    Past Medical History:  Past Medical History:   Diagnosis Date   . Anxiety    . Arthritis    . Cellulitis 10/24/14    left foot   . 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 Depression    . Diabetes mellitus (CMS [place])    . Essential hypertension    . Fatty liver    . Headache(784.0)    . Hyperlipidemia    . Irritable bowel syndrome    . Macular edema    . Neck problem     good ROM   . Obesity    . Type 2 diabetes mellitus (CMS HCC)    . White coat hypertension            Past Surgical History:  Past Surgical History:   Procedure Laterality Date   . ECTOPIC PREGNANCY SURGERY  1999   . HX CATARACT REMOVAL Bilateral    . HX CHOLECYSTECTOMY  09/10/96   . 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 HX PELVIC LAPAROSCOPY  05/24/98    Ectopic pregnancy   . HX TUBAL LIGATION  09/10/2012    Fil[place]ie Clips   . HYMENECTOMY             Review of Systems:  Constitutional: No fever, chills, or weight loss  GU: As per HPI    Allergies:  Allergies   Allergen Reactions   . Vicodin [Hydrocodone-Acetaminophen] Itching       Home Medications:  Prior to Admission medications    Medication Sig Start Date End Date Taking? Authorizing Provider   amLODIPine  (NORVASC) 2.5 mg Oral Tablet Take 5 mg by mouth Once a day    Yes Provider, Historical   atorvastatin (LIPITOR) 20 mg Oral Tablet Take 20 mg by mouth Every evening   Yes Provider, Historical   Fish Oil-Omega-3 Fatty Acids 360-1,200 mg Oral Capsule Take 2 Caps by mouth Twice daily   Yes Provider, Historical   fluticasone (FLONASE) 50 mcg/actuation Nasal Spray, Suspension 1 Spray by Each Nostril route Once a day    Provider, Historical   furosemide (LASIX) 20 mg Oral Tablet Take 40 mg by mouth Twice daily    Yes Provider, Historical   glipiZIDE (GLUCOTROL) 5 mg Oral Tablet Take 2.5 mg by mouth Twice a day before meals Take 30 minutes before meals   Yes Provider, Historical   loperamide (IMODIUM) 2 mg Oral Capsule Take 2 mg by mouth Every 4 hours as needed    Provider,  Historical   loratadine (CLARITIN) 10 mg Oral Tablet Take 10 mg by mouth Once per day as needed     Provider, Historical   losartan (COZAAR) 25 mg Oral Tablet Take 100 mg by mouth Once a day    Yes Provider, Historical   magnesium oxide (MAG-OX) 400 mg (241.3 mg magnesium) Oral Tablet Take 250 mg by mouth Once a day    Yes Provider, Historical       Social History:  Social History     Tobacco Use   . Smoking status: Never Smoker   . Smokeless tobacco: Never Used   Substance Use Topics   . Alcohol use: No   . Drug use: No       Family History:  Family Medical History:     Problem Relation (Age of Onset)    Cancer Maternal Grandmother    Diabetes Father    Hypertension (High Blood Pressure) Maternal Uncle, Father              OBJECTIVE:    Vitals:  BP (!) 183/105   Pulse 90   Temp 37.3 C (99.1 F) (Thermal Scan)   Ht 1.702 m (5\' 7" )   Wt 134.7 kg (297 lb)   BMI 46.52 kg/m         Physical examination:  Constitutional: Appears well, no acute distress.    Respiratory: Non-labored breathing  GI: soft, not distended, no masses.   GU: No CVA tenderness, Bladder Non-tender  Musculoskeletal: Moves all extremities   Psychiatric: Mood and affect normal.          Labs:  Pertinent labs reviewed.      Imaging studies:  Pertinent imaging studies reviewed.    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  Renal function has slightly worsened, but is in the same basic range.  Plan to continue surveillance with an MRI in one year.      No orders of the defined types were placed in this encounter.            This note was completed using voice recognition technology. Please excuse any errors that have occurred as a result and contact me if any parts of the note are unclear.

## 2018-12-10 NOTE — Assessment & Plan Note (Signed)
No evidence of recurrence on ultrasound.  Renal function has slightly worsened, but is in the same basic range.  Plan to continue surveillance with an MRI in one year.

## 2018-12-25 HISTORY — PX: PELVIC LAPAROSCOPY: SHX162

## 2019-01-08 ENCOUNTER — Ambulatory Visit (HOSPITAL_BASED_OUTPATIENT_CLINIC_OR_DEPARTMENT_OTHER): Admit: 2019-01-08 | Payer: Self-pay | Admitting: Obstetrics & Gynecology

## 2019-01-08 ENCOUNTER — Encounter (HOSPITAL_BASED_OUTPATIENT_CLINIC_OR_DEPARTMENT_OTHER): Payer: Self-pay

## 2019-01-08 SURGERY — HYSTEROSCOPY WITH DILATION AND CURETTAGE
Anesthesia: General

## 2019-01-10 DIAGNOSIS — N183 Chronic kidney disease, stage 3 unspecified: Secondary | ICD-10-CM | POA: Insufficient documentation

## 2019-01-10 DIAGNOSIS — E559 Vitamin D deficiency, unspecified: Secondary | ICD-10-CM | POA: Insufficient documentation

## 2019-01-10 DIAGNOSIS — N189 Chronic kidney disease, unspecified: Secondary | ICD-10-CM | POA: Insufficient documentation

## 2019-01-10 DIAGNOSIS — R809 Proteinuria, unspecified: Secondary | ICD-10-CM | POA: Insufficient documentation

## 2019-01-10 DIAGNOSIS — C649 Malignant neoplasm of unspecified kidney, except renal pelvis: Secondary | ICD-10-CM | POA: Insufficient documentation

## 2019-01-10 DIAGNOSIS — E1121 Type 2 diabetes mellitus with diabetic nephropathy: Secondary | ICD-10-CM | POA: Insufficient documentation

## 2019-01-10 DIAGNOSIS — N2581 Secondary hyperparathyroidism of renal origin: Secondary | ICD-10-CM | POA: Insufficient documentation

## 2019-01-21 DIAGNOSIS — E782 Mixed hyperlipidemia: Secondary | ICD-10-CM | POA: Insufficient documentation

## 2019-02-03 ENCOUNTER — Telehealth: Payer: Self-pay

## 2019-02-03 NOTE — Telephone Encounter (Signed)
LMOVM for a return call for a referral to see Dr Raquel Sarna.  Cyst on her foot. sd

## 2019-02-12 ENCOUNTER — Telehealth: Payer: Self-pay

## 2019-02-12 NOTE — Telephone Encounter (Signed)
LVMM to schedule an apt to see Dr. Raquel Sarna (Paper Referral)

## 2019-06-06 ENCOUNTER — Other Ambulatory Visit: Payer: Self-pay

## 2019-06-17 ENCOUNTER — Emergency Department
Admission: EM | Admit: 2019-06-17 | Discharge: 2019-06-17 | Disposition: A | Discharge status: Home / Self Care | Payer: Worker's Comp, Other unspecified | Attending: Family Medicine | Admitting: Family Medicine

## 2019-06-17 ENCOUNTER — Emergency Department: Payer: Worker's Comp, Other unspecified

## 2019-06-17 DIAGNOSIS — S60221A Contusion of right hand, initial encounter: Secondary | ICD-10-CM

## 2019-06-17 DIAGNOSIS — W230XXA Caught, crushed, jammed, or pinched between moving objects, initial encounter: Secondary | ICD-10-CM | POA: Insufficient documentation

## 2019-06-17 HISTORY — DX: Malignant (primary) neoplasm, unspecified: C80.1

## 2019-06-17 NOTE — ED Triage Notes (Signed)
Pt states that she jammed her right hand in the milk cooler last pm.  Right 2, 3 and 4 th fingers were injured.

## 2019-06-17 NOTE — ED Provider Notes (Signed)
[facility]             Patient Name: Kelli Carlson, Kelli Carlson Patient DOB:  1966/01/20   Encounter Date:  06/17/2019 Age: 53 y.o. female   Attending ED Physician: Jennette Bill, MD MRN:  [id number]   Room:  EX1/EX1-Carlson PCP: Pcp, None, MD      Diagnosis / Disposition:   Final Impression  1. Contusion of right hand, initial encounter        Disposition              ED Disposition     ED Disposition Condition Date/Time Comment    Discharge  Tue Jun 17, 2019  7:05 PM Sigurd Sos discharge to home/self care.    Condition at disposition: Stable          Follow up  [facility]  [place] [facility]  In 1 day  For reevaluation and further management as needed.    Ocie Doyne, NP  [address] [place] [address]  [phone]    In 1 week  For reevaluation and further management as needed.      Prescriptions  New Prescriptions    No medications on file         History of Presenting Illness:   Chief complaint: Hand Injury      DXA:JOINOMV is Carlson 53 year old was brought to the ED with Carlson chief complaint of right hand contusion.  The patient reports that she was working with milk and jammed her hand and Carlson milk carton causing injury to the index middle and ring finger on the right upper extremity.  The patient reports that this happened yesterday and today she is able to bend her ring finger but reports that the index and middle finger are still swollen.  Currently the patient denies any other acute symptoms.           No notes on file    In addition to the above history, please see nursing notes. Allergies, meds, past medical, family, social hx, and the results of the diagnostic studies performed have been reviewed by myself.      Review of Systems   Review of Systems   Constitutional: Negative for fatigue and fever.   HENT: Negative for congestion and dental problem.    Respiratory: Negative for cough and shortness of breath.    Cardiovascular:  Negative for chest pain and palpitations.   Gastrointestinal: Negative for abdominal distention and abdominal pain.   Musculoskeletal: Positive for arthralgias and joint swelling.   Skin: Negative for color change.   All other systems reviewed and are negative.      All other systems reviewed and negative except as above, pertinent findings in HPI.        Allergies / Medications:   Pt is allergic to vicodin [hydrocodone-acetaminophen].    Current/Home Medications    ALBUTEROL (PROVENTIL HFA;VENTOLIN HFA) 108 (90 BASE) MCG/ACT INHALER    Inhale 2 puffs into the lungs every 6 (six) hours as needed for Wheezing or Shortness of Breath (cough).    AMLODIPINE (NORVASC) 10 MG TABLET        AMLODIPINE (NORVASC) 2.5 MG TABLET        ATORVASTATIN (LIPITOR) 20 MG TABLET        CHOLECALCIFEROL (VITAMIN D) 400 UNITS/ML LIQUID (NICU)    Take 800 Units by mouth daily.    ESCITALOPRAM (LEXAPRO) 10  MG TABLET        FUROSEMIDE (LASIX) 40 MG TABLET        GLIPIZIDE (GLUCOTROL) 5 MG TABLET    Take 2.5 mg by mouth 2 (two) times daily before meals.2.5 daily        LOPERAMIDE (IMODIUM) 2 MG CAPSULE    Take 2 mg by mouth daily as needed for Diarrhea.    LORATADINE (CLARITIN) 10 MG TABLET    Take 10 mg by mouth daily.    LOSARTAN (COZAAR) 25 MG TABLET    Take 100 mg by mouth    LOSARTAN-HYDROCHLOROTHIAZIDE (HYZAAR) 100-12.5 MG PER TABLET    Take 1 tablet by mouth daily.    MAGNESIUM OXIDE (MAG-OX) 400 MG TABLET    Take 400 mg by mouth daily.         Past History:   Medical: Pt has Carlson past medical history of Anxiety, Arthritis, Cellulitis, Depression, Diabetes mellitus, Fatty liver, Irritable bowel syndrome, Malignant neoplasm, Obesity, and White coat hypertension.    Surgical: Pt  has Carlson past surgical history that includes Cholecystectomy; Pelvic laparoscopy; Tubal ligation; and Abdominal surgery.    Family: The family history includes Hypertension in her father; No known problems in her mother.    Social: Pt reports that she has never  smoked. She has never used smokeless tobacco. She reports that she does not drink alcohol or use drugs.      Physical Exam:   Physical Exam  Vitals signs and nursing note reviewed.   Constitutional:       Appearance: Normal appearance.   HENT:      Head: Normocephalic and atraumatic.   Eyes:      Extraocular Movements: Extraocular movements intact.      Pupils: Pupils are equal, round, and reactive to light.   Neck:      Musculoskeletal: Normal range of motion and neck supple.   Cardiovascular:      Rate and Rhythm: Normal rate and regular rhythm.      Pulses: Normal pulses.   Pulmonary:      Effort: Pulmonary effort is normal. No respiratory distress.      Breath sounds: Normal breath sounds.   Abdominal:      Palpations: Abdomen is soft.      Tenderness: There is no abdominal tenderness.   Musculoskeletal:         General: Swelling (Of the index and middle finger) and tenderness (Tenderness on palpation of the second MCP of the index and middle finger) present. No deformity.   Skin:     General: Skin is warm and dry.   Neurological:      Mental Status: She is alert.   Psychiatric:         Mood and Affect: Mood normal.         Behavior: Behavior normal.            ED Vital Signs   Vitals:    06/17/19 1837   BP: 185/85   Pulse: 83   Resp: 20   Temp: 97 F (36.1 C)   TempSrc: Tympanic   SpO2: 98%   Weight: 137.2 kg             MDM:   MDM  Number of Diagnoses or Management Options  Diagnosis management comments: Contusion to the index/middle/ring finger on the right upper extremity, ice is getting better although there is mild swelling on the index and middle finger, patient is not able to  take ibuprofen, counseled regarding Tylenol, conservative treatment with ice packs and elevation, follow-up with primary care physician in 1 week for reevaluation.  Strict return precautions given in case of worsening symptoms.    7:05 PM      Course in ED:     7:05 PM      Diagnostic Results:   The results of the diagnostic  studies have been reviewed by myself:    Radiologic Studies  No results found.    Lab Studies  Lab Results    None               EKG:           Procedure / Critical Care time/EKG:   Procedures    Total time providing critical care:     ATTESTATIONS   This chart was generated by an EMR and may contain errors or additions/omissions not intended by the user.           Jennette Bill, MD               Jennette Bill, MD  06/17/19 Drema Halon

## 2019-06-17 NOTE — Discharge Instructions (Signed)
Hand Bruise   You have a bruise (contusion). There is swelling and some bleeding under the skin, but no broken bones. This injurygenerallytakes a few days to a few weeks to heal. During that time, the bruise will typically change in color fromreddish, to purple-blue, to greenish-yellow, then to yellow-brown.   Home care   Elevate the hand to reduce pain and swelling.As much as possible, sit or lie down with the hand raised about the level of your heart.This is especially important during the first 48 hours.   Ice the hand to help reduce pain and swelling.Wrap an ice packor ice cubes in a plastic bag in a thin towel. Apply to the bruised area for 20 minutes every 1 to 2 hours the first day. Continue this 3 to 4 times a day until the pain and swelling goes away.   Unless another medicine was prescribed, you can take acetaminophen, ibuprofen, or naproxento control pain. Talk with your healthcare provider before using these medicines if you have chronic liver or kidney disease or ever had a stomach ulcer or digestive bleeding    Follow up  Follow up with your healthcare provider, or as advised. Call if you do not get better within1 to 2 weeks.   When to seek medical advice  Call your healthcare provider right away if you have any of the following:   Increased pain or swelling   Arm becomes cold, blue, numb or tingly   Signs of infection:Warmth, drainage, or increased redness or pain around the bruise   Inability to move the injured hand, any of the fingers, or the joints of the finger   Frequent bruising for unknown reasons  [employer] last reviewed this educational content on 04/25/2018   2000-2020 [employer] [address], [place] [address]. All rights reserved. This information is not intended as a substitute for professional medical care. Always follow your healthcare professional's instructions.

## 2019-06-17 NOTE — ED Notes (Signed)
Patient discharged to home with instructions, patient verbalized understanding to all.

## 2019-07-27 DIAGNOSIS — U071 COVID-19: Secondary | ICD-10-CM

## 2019-07-27 HISTORY — DX: COVID-19: U07.1

## 2019-08-07 ENCOUNTER — Encounter (RURAL_HEALTH_CENTER): Payer: Self-pay

## 2019-08-11 ENCOUNTER — Other Ambulatory Visit (RURAL_HEALTH_CENTER): Payer: Self-pay | Admitting: Gerontology

## 2019-08-11 ENCOUNTER — Ambulatory Visit (RURAL_HEALTH_CENTER): Payer: Self-pay | Admitting: Gerontology

## 2019-08-11 MED ORDER — GLIPIZIDE 5 MG PO TABS
2.5000 mg | ORAL_TABLET | Freq: Two times a day (BID) | ORAL | 0 refills | Status: DC
Start: 2019-08-11 — End: 2019-09-16

## 2019-08-11 NOTE — Telephone Encounter (Signed)
Patient calling requesting refill.    Medication(s):Glipizide    Pharmacy:[facility]        Patient called front desk : stating- she is a new patient and was suppose to have an appointment today but is positive for COVID.    Asking for refill

## 2019-08-15 ENCOUNTER — Other Ambulatory Visit: Payer: Self-pay

## 2019-08-15 ENCOUNTER — Emergency Department (HOSPITAL_COMMUNITY): Payer: 59

## 2019-08-15 ENCOUNTER — Encounter (HOSPITAL_COMMUNITY): Payer: Self-pay

## 2019-08-15 ENCOUNTER — Emergency Department
Admission: EM | Admit: 2019-08-15 | Discharge: 2019-08-15 | Disposition: A | Discharge status: Home / Self Care | Payer: 59 | Attending: Emergency Medicine | Admitting: Emergency Medicine

## 2019-08-15 DIAGNOSIS — N189 Chronic kidney disease, unspecified: Secondary | ICD-10-CM | POA: Insufficient documentation

## 2019-08-15 DIAGNOSIS — I129 Hypertensive chronic kidney disease with stage 1 through stage 4 chronic kidney disease, or unspecified chronic kidney disease: Secondary | ICD-10-CM | POA: Insufficient documentation

## 2019-08-15 DIAGNOSIS — U071 COVID-19: Secondary | ICD-10-CM | POA: Insufficient documentation

## 2019-08-15 DIAGNOSIS — E1122 Type 2 diabetes mellitus with diabetic chronic kidney disease: Secondary | ICD-10-CM | POA: Insufficient documentation

## 2019-08-15 DIAGNOSIS — R112 Nausea with vomiting, unspecified: Secondary | ICD-10-CM | POA: Insufficient documentation

## 2019-08-15 DIAGNOSIS — R531 Weakness: Secondary | ICD-10-CM | POA: Insufficient documentation

## 2019-08-15 LAB — URINALYSIS WITH MICROSCOPIC REFLEX IF INDICATED BMC/JMC ONLY
BILIRUBIN: NEGATIVE mg/dL
GLUCOSE: NEGATIVE mg/dL
KETONES: NEGATIVE mg/dL
LEUKOCYTES: NEGATIVE WBCs/uL
NITRITE: NEGATIVE
PH: 5.5 (ref <8.0)
PROTEIN: 100 mg/dL — AB
SPECIFIC GRAVITY: >=1.03 — ABNORMAL HIGH (ref <1.022)
UROBILINOGEN: 0.2 mg/dL (ref <=2.0)

## 2019-08-15 LAB — COMPREHENSIVE METABOLIC PROFILE - BMC/JMC ONLY
ALBUMIN/GLOBULIN RATIO: 1 (ref 0.8–2.0)
ALBUMIN: 3.2 g/dL — ABNORMAL LOW (ref 3.5–5.0)
ALKALINE PHOSPHATASE: 79 U/L (ref 38–126)
ALT (SGPT): 28 U/L (ref 14–54)
ANION GAP: 10 mmol/L (ref 3–11)
AST (SGOT): 26 U/L (ref 15–41)
BILIRUBIN TOTAL: 0.8 mg/dL (ref 0.3–1.2)
BUN/CREA RATIO: 18 (ref 6–22)
BUN: 35 mg/dL — ABNORMAL HIGH (ref 6–20)
CALCIUM: 8.9 mg/dL (ref 8.6–10.3)
CHLORIDE: 101 mmol/L (ref 101–111)
CO2 TOTAL: 25 mmol/L (ref 22–32)
CREATININE: 1.92 mg/dL — ABNORMAL HIGH (ref 0.44–1.00)
ESTIMATED GFR: 30 mL/min/{1.73_m2} — ABNORMAL LOW (ref >60)
GLUCOSE: 206 mg/dL — ABNORMAL HIGH (ref 70–110)
POTASSIUM: 4.2 mmol/L (ref 3.4–5.1)
PROTEIN TOTAL: 6.3 g/dL — ABNORMAL LOW (ref 6.4–8.3)
SODIUM: 136 mmol/L (ref 136–145)

## 2019-08-15 LAB — CBC WITH DIFF
BASOPHIL #: 0 10*3/uL (ref 0.00–0.10)
BASOPHIL %: 0 % (ref 0–3)
EOSINOPHIL #: 0 10*3/uL (ref 0.00–0.50)
EOSINOPHIL %: 0 % (ref 0–5)
HCT: 38 % (ref 36.0–45.0)
HGB: 12.9 g/dL (ref 12.0–15.5)
LYMPHOCYTE #: 0.8 10*3/uL — ABNORMAL LOW (ref 1.00–4.80)
LYMPHOCYTE %: 16 % (ref 15–43)
MCH: 30.2 pg (ref 27.5–33.2)
MCHC: 34 g/dL (ref 32.0–36.0)
MCV: 88.9 fL (ref 82.0–97.0)
MONOCYTE #: 0.4 10*3/uL (ref 0.20–0.90)
MONOCYTE %: 8 % (ref 5–12)
MPV: 9.4 fL (ref 7.4–10.5)
NEUTROPHIL #: 4 10*3/uL (ref 1.50–6.50)
NEUTROPHIL %: 76 % (ref 43–76)
PLATELETS: 161 10*3/uL (ref 150–450)
RBC: 4.28 10*6/uL (ref 4.00–5.10)
RDW: 14.1 % (ref 11.0–16.0)
WBC: 5.3 10*3/uL (ref 4.0–11.0)

## 2019-08-15 LAB — URINALYSIS, MICROSCOPIC

## 2019-08-15 LAB — CREATINE KINASE (CK), TOTAL, SERUM OR PLASMA: CREATINE KINASE: 61 U/L (ref 38–234)

## 2019-08-15 MED ORDER — SODIUM CHLORIDE 0.9 % IV BOLUS
1000.00 mL | INJECTION | Status: AC
Start: 2019-08-15 — End: 2019-08-15
  Administered 2019-08-15: 1000 mL via INTRAVENOUS
  Administered 2019-08-15: 16:00:00 0 mL via INTRAVENOUS

## 2019-08-15 MED ORDER — ONDANSETRON 4 MG DISINTEGRATING TABLET
4.00 mg | ORAL_TABLET | Freq: Three times a day (TID) | ORAL | 0 refills | Status: DC | PRN
Start: 2019-08-15 — End: 2019-08-15

## 2019-08-15 MED ORDER — PROCHLORPERAZINE EDISYLATE 10 MG/2 ML (5 MG/ML) INJECTION SOLUTION
10.00 mg | INTRAMUSCULAR | Status: AC
Start: 2019-08-15 — End: 2019-08-15
  Administered 2019-08-15: 10 mg via INTRAVENOUS
  Filled 2019-08-15: qty 2

## 2019-08-15 MED ORDER — DIPHENHYDRAMINE 50 MG/ML INJECTION SOLUTION
12.50 mg | INTRAMUSCULAR | Status: AC
Start: 2019-08-15 — End: 2019-08-15
  Administered 2019-08-15: 12.5 mg via INTRAVENOUS
  Filled 2019-08-15: qty 1

## 2019-08-15 MED ORDER — ONDANSETRON 4 MG DISINTEGRATING TABLET
4.00 mg | ORAL_TABLET | Freq: Three times a day (TID) | ORAL | 0 refills | Status: DC | PRN
Start: 2019-08-15 — End: 2019-08-22

## 2019-08-15 NOTE — ED Provider Notes (Signed)
Kym Groom, PA-C  [facility]  Emergency Department Visit Note    Date: 08/15/2019  Primary care provider: Ocie Doyne, NP  Means of arrival: private car  History obtained by: patient  History limited by: none      Chief Complaint: nausea/vomting    History of Present Illness     [name], date of birth 1966-08-05, is a 53 y.o. female who presents to the Emergency Department complaining of nausea and vomiting that has been present for 3 days. Patient states that she was diagnosed 1 week ago with COVID-19. She developed nausea and vomiting 3 days prior to arrival to ED. She has been trying to manage her symptoms at home however she feels weak and is concerned for possible dehydration.      Review of Systems     The pertinent positive and negative symptoms are as per HPI. All other systems reviewed and are negative.    Patient History      Past Medical History:  Past Medical History:   Diagnosis Date   . Anxiety    . Arthritis    . Cellulitis 10/24/14    left foot   . 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 Depression    . Diabetes mellitus (CMS [place])    . Essential hypertension    . Fatty liver    . Headache(784.0)    . Hyperlipidemia    . Irritable bowel syndrome    . Macular edema    . Neck problem     good ROM   . Obesity    . Type 2 diabetes mellitus (CMS HCC)    . White coat hypertension      Past Surgical History:  Past Surgical History:   Procedure Laterality Date   . Ectopic pregnancy surgery  1999   . Hx cataract removal Bilateral    . Hx cholecystectomy  09/10/96   . Hx other  1996   . Hx pelvic laparoscopy  05/24/98   . Hx tubal ligation  09/10/2012   . Hymenectomy         Family History:  Family Medical History:     Problem Relation (Age of Onset)    Cancer Maternal Grandmother    Diabetes Father    Hypertension (High Blood Pressure) Maternal Uncle, Father        Social History:  Social History     Tobacco Use   . Smoking status: Never Smoker   . Smokeless tobacco:  Never Used   Substance Use Topics   . Alcohol use: No   . Drug use: No     Social History     Substance and Sexual Activity   Drug Use No       Medications:  Current Outpatient Medications   Medication Sig   . amLODIPine (NORVASC) 2.5 mg Oral Tablet Take 5 mg by mouth Once a day    . atorvastatin (LIPITOR) 20 mg Oral Tablet Take 20 mg by mouth Every evening   . Fish Oil-Omega-3 Fatty Acids 360-1,200 mg Oral Capsule Take 2 Caps by mouth Twice daily   . fluticasone (FLONASE) 50 mcg/actuation Nasal Spray, Suspension 1 Spray by Each Nostril route Once a day   . furosemide (LASIX) 20 mg Oral Tablet Take 40 mg by mouth Twice daily    . glipiZIDE (GLUCOTROL) 5 mg Oral Tablet Take 2.5 mg by mouth Twice a day before meals Take 30 minutes before  meals   . loperamide (IMODIUM) 2 mg Oral Capsule Take 2 mg by mouth Every 4 hours as needed   . loratadine (CLARITIN) 10 mg Oral Tablet Take 10 mg by mouth Once per day as needed    . losartan (COZAAR) 25 mg Oral Tablet Take 100 mg by mouth Once a day    . magnesium oxide (MAG-OX) 400 mg (241.3 mg magnesium) Oral Tablet Take 250 mg by mouth Once a day    . ondansetron (ZOFRAN ODT) 4 mg Oral Tablet, Rapid Dissolve Take 1 Tab (4 mg total) by mouth Every 8 hours as needed for Nausea/Vomiting for up to 4 days       Allergies:   Allergies   Allergen Reactions   . Vicodin [Hydrocodone-Acetaminophen] Itching       Physical Exam     Vital Signs:  ED Triage Vitals [08/15/19 1346]   BP (Non-Invasive) (!) 137/91   Heart Rate 92   Respiratory Rate 18   Temperature 36.9 C (98.5 F)   SpO2 96 %   Weight 131 kg (289 lb 9.6 oz)   Height 1.702 m (5\' 7" )       The initial visit vital signs are reviewed as above.     Pulse Ox: 96% on None (Room Air); interpreted by me as:  normal  GENERAL:  This is a well appearing  53 y.o.  female  who is interactive, appropriate and showing no outward signs of distress.    HEENT:  Atraumatic, normocephalic.  Anicteric.  PERRL.  Conjunctiva normal in appearance.   Oropharynx is clear.  Mucous membranes are moist.     NECK:  Supple, no nuchal rigidity or meningeal signs. Trachea is midline. No anterior cervical adenopathy.  CHEST:  No signs of trauma.  Normal and equal expansion of the thorax while breathing.  HEART:  Regular rate and rhythm without an obvious murmur heard.  LUNGS:  Clear to ascultation bilaterally without any adventitious sounds.  ABDOMEN:  Non-distended.  Bowel sounds present. Soft, non-tender to palpation.  EXTREMITY:  Good range of motion.  Normal strength throughout.  No calf tenderness or peripheral edema noted.  NEURO/PSYCH:  Patient is interactive, appropriate and no gross abnormal neurological findings.     Diagnostics     Labs:    Results for orders placed or performed during the hospital encounter of 08/15/19   COMPREHENSIVE METABOLIC PROFILE - [facility] ONLY   Result Value Ref Range    SODIUM 136 136 - 145 mmol/L    POTASSIUM 4.2 3.4 - 5.1 mmol/L    CHLORIDE 101 101 - 111 mmol/L    CO2 TOTAL 25 22 - 32 mmol/L    ANION GAP 10 3 - 11 mmol/L    BUN 35 (H) 6 - 20 mg/dL    CREATININE 1.92 (H) 0.44 - 1.00 mg/dL    BUN/CREA RATIO 18 6 - 22    ESTIMATED GFR 30 (L) >60 mL/min/1.79m^2    ALBUMIN 3.2 (L) 3.5 - 5.0 g/dL    CALCIUM 8.9 8.6 - 10.3 mg/dL    GLUCOSE 206 (H) 70 - 110 mg/dL    ALKALINE PHOSPHATASE 79 38 - 126 U/L    ALT (SGPT) 28 14 - 54 U/L    AST (SGOT) 26 15 - 41 U/L    BILIRUBIN TOTAL 0.8 0.3 - 1.2 mg/dL    PROTEIN TOTAL 6.3 (L) 6.4 - 8.3 g/dL    ALBUMIN/GLOBULIN RATIO 1.0 0.8 - 2.0   URINALYSIS WITH  MICROSCOPIC REFLEX IF INDICATED [facility] ONLY   Result Value Ref Range    COLOR Yellow Light Yellow, Straw, Yellow    APPEARANCE Clear Clear    PH 5.5 <8.0    LEUKOCYTES Negative Negative WBCs/uL    NITRITE Negative Negative    PROTEIN 100  (A) Negative, 10  mg/dL    GLUCOSE Negative Negative mg/dL    KETONES Negative Negative mg/dL    UROBILINOGEN 0.2  <=2.0 mg/dL    BILIRUBIN Negative Negative mg/dL    BLOOD Trace (A) Negative mg/dL    SPECIFIC  GRAVITY >=1.030 (H) <1.022   Creatine Kinase Total, CK   Result Value Ref Range    CREATINE KINASE 61 38 - 234 U/L   CBC WITH DIFF   Result Value Ref Range    WBC 5.3 4.0 - 11.0 x10^3/uL    RBC 4.28 4.00 - 5.10 x10^6/uL    HGB 12.9 12.0 - 15.5 g/dL    HCT 38.0 36.0 - 45.0 %    MCV 88.9 82.0 - 97.0 fL    MCH 30.2 27.5 - 33.2 pg    MCHC 34.0 32.0 - 36.0 g/dL    RDW 14.1 11.0 - 16.0 %    PLATELETS 161 150 - 450 x10^3/uL    MPV 9.4 7.4 - 10.5 fL    NEUTROPHIL % 76 43 - 76 %    LYMPHOCYTE % 16 15 - 43 %    MONOCYTE % 8 5 - 12 %    EOSINOPHIL % 0 0 - 5 %    BASOPHIL % 0 0 - 3 %    NEUTROPHIL # 4.00 1.50 - 6.50 x10^3/uL    LYMPHOCYTE # 0.80 (L) 1.00 - 4.80 x10^3/uL    MONOCYTE # 0.40 0.20 - 0.90 x10^3/uL    EOSINOPHIL # 0.00 0.00 - 0.50 x10^3/uL    BASOPHIL # 0.00 0.00 - 0.10 x10^3/uL   URINALYSIS, MICROSCOPIC   Result Value Ref Range    RBCS 0-2 0 - 2 /hpf    WBCS 2-5 (A) 0 - 2 /hpf    BACTERIA Slight (A) None /hpf    SQUAMOUS EPITHELIAL 2-5 (A) 0 - 2 /hpf    TRANSITIONAL EPITHELIAL 0-2 0 - 2 /hpf    HYALINE CASTS 30-50 (A) 0 - 2 /lpf     Labs reviewed and interpreted by me.      ED Progress Note/Medical Decision Making     Orders Placed This Encounter   . COMPREHENSIVE METABOLIC PROFILE - [facility] ONLY   . CBC/DIFF   . URINALYSIS WITH MICROSCOPIC REFLEX IF INDICATED [facility] ONLY   . Creatine Kinase Total, CK   . CBC WITH DIFF   . URINALYSIS, MICROSCOPIC   . INSERT & MAINTAIN PERIPHERAL IV ACCESS   . NS bolus infusion 1,000 mL   . prochlorperazine (COMPAZINE) 5 mg/mL injection   . diphenhydrAMINE (BENADRYL) 50 mg/mL injection   . ondansetron (ZOFRAN ODT) 4 mg Oral Tablet, Rapid Dissolve       1410: Patient was initially evaluated by me, possible etiologies for symptoms were discussed with the patient and the need for further evaluation to include labs as ordered above. She was advised that I will order compazine and IV fludis for her symptoms. The patient verbalized understanding and was in agreement with the proposed care  plan at this time.    1540: Recheck: patient states that her nausea has resolved and has had no vomiting since arrival. She was educated of her  lab results and advised that she will be discharged home with prescription for Zofran. She was given strict return precautions and strongly encouraged to return to ED for new or worsening symptoms.    Pre-Disposition Vitals:  Filed Vitals:    08/15/19 1346   BP: (!) 137/91   Pulse: 92   Resp: 18   Temp: 36.9 C (98.5 F)   SpO2: 96%       Clinical Impression      1. Acute nausea/vomiting  2. COVID-19  3. Chronic renal failure    Plan/Disposition     The Emergency Department impression was discussed in detail. There is no reasonable probability of an unstable emergency medical condition.  There is no indication for further evaluation or testing at this time in the Emergency Department.  The patient is currently stable for discharge.  All questions and concerns were answered to their satisfaction and they agree with the plan.  Indications for immediate return to the emergency department and the importance of timely follow up were discussed.       Discharged      Follow Up:  Ocie Doyne, NP  [place] [address]  [phone]    Call in 3 days      [facility] - Emergency Department  [place]  [phone]    If symptoms worsen      Condition on Disposition: Stable

## 2019-08-15 NOTE — ED Nurses Note (Signed)
Current Discharge Medication List      START taking these medications.      Details   ondansetron 4 mg Tablet, Rapid Dissolve  Commonly known as: ZOFRAN ODT   4 mg, Oral, EVERY 8 HOURS PRN  Qty: 12 Tab  Refills: 0        CONTINUE these medications - NO CHANGES were made during your visit.      Details   amLODIPine 2.5 mg Tablet  Commonly known as: NORVASC   5 mg, Oral, DAILY  Refills: 0     atorvastatin 20 mg Tablet  Commonly known as: LIPITOR   20 mg, Oral, EVERY EVENING  Refills: 0     Claritin 10 mg Tablet  Generic drug: loratadine   10 mg, Oral, DAILY PRN  Refills: 0     Fish Oil-Omega-3 Fatty Acids 360-1,200 mg Capsule   2 Caps, Oral, 2 TIMES DAILY  Refills: 0     fluticasone propionate 50 mcg/actuation Spray, Suspension  Commonly known as: FLONASE   1 Spray, Each Nostril, DAILY  Refills: 0     furosemide 20 mg Tablet  Commonly known as: LASIX   40 mg, Oral, 2 TIMES DAILY  Refills: 0     glipiZIDE 5 mg Tablet  Commonly known as: GLUCOTROL   2.5 mg, Oral, 2 TIMES DAILY BEFORE MEALS, Take 30 minutes before meals  Refills: 0     loperamide 2 mg Capsule  Commonly known as: IMODIUM   2 mg, Oral, EVERY 4 HOURS PRN  Refills: 0     losartan 25 mg Tablet  Commonly known as: COZAAR   100 mg, Oral, DAILY  Refills: 0     magnesium oxide 400 mg Tablet  Commonly known as: MAG-OX   250 mg, Oral, DAILY  Refills: 0

## 2019-08-15 NOTE — ED Triage Notes (Signed)
Patient with Covid +.   Vomiting x 3 days.   Unable to tolerate intake.

## 2019-08-20 ENCOUNTER — Emergency Department (HOSPITAL_COMMUNITY): Payer: 59

## 2019-08-20 ENCOUNTER — Other Ambulatory Visit: Payer: Self-pay

## 2019-08-20 ENCOUNTER — Encounter (HOSPITAL_COMMUNITY): Payer: Self-pay

## 2019-08-20 ENCOUNTER — Observation Stay
Admission: EM | Admit: 2019-08-20 | Discharge: 2019-08-22 | Disposition: A | Discharge status: Home / Self Care | Payer: 59 | Attending: Internal Medicine | Admitting: Internal Medicine

## 2019-08-20 DIAGNOSIS — R1115 Cyclical vomiting syndrome unrelated to migraine: Secondary | ICD-10-CM | POA: Insufficient documentation

## 2019-08-20 DIAGNOSIS — Z79899 Other long term (current) drug therapy: Secondary | ICD-10-CM | POA: Insufficient documentation

## 2019-08-20 DIAGNOSIS — J1289 Other viral pneumonia: Secondary | ICD-10-CM | POA: Insufficient documentation

## 2019-08-20 DIAGNOSIS — Z6841 Body Mass Index (BMI) 40.0 and over, adult: Secondary | ICD-10-CM | POA: Insufficient documentation

## 2019-08-20 DIAGNOSIS — I82409 Acute embolism and thrombosis of unspecified deep veins of unspecified lower extremity: Secondary | ICD-10-CM

## 2019-08-20 DIAGNOSIS — E860 Dehydration: Secondary | ICD-10-CM | POA: Insufficient documentation

## 2019-08-20 DIAGNOSIS — E1122 Type 2 diabetes mellitus with diabetic chronic kidney disease: Secondary | ICD-10-CM | POA: Insufficient documentation

## 2019-08-20 DIAGNOSIS — D649 Anemia, unspecified: Secondary | ICD-10-CM | POA: Insufficient documentation

## 2019-08-20 DIAGNOSIS — R0902 Hypoxemia: Secondary | ICD-10-CM | POA: Insufficient documentation

## 2019-08-20 DIAGNOSIS — I129 Hypertensive chronic kidney disease with stage 1 through stage 4 chronic kidney disease, or unspecified chronic kidney disease: Secondary | ICD-10-CM | POA: Insufficient documentation

## 2019-08-20 DIAGNOSIS — E785 Hyperlipidemia, unspecified: Secondary | ICD-10-CM | POA: Insufficient documentation

## 2019-08-20 DIAGNOSIS — U071 COVID-19: Principal | ICD-10-CM | POA: Insufficient documentation

## 2019-08-20 DIAGNOSIS — R197 Diarrhea, unspecified: Secondary | ICD-10-CM | POA: Insufficient documentation

## 2019-08-20 DIAGNOSIS — N183 Chronic kidney disease, stage 3 unspecified: Secondary | ICD-10-CM | POA: Insufficient documentation

## 2019-08-20 LAB — CBC WITH DIFF
BASOPHIL #: 0 10*3/uL (ref 0.00–0.10)
BASOPHIL %: 0 % (ref 0–3)
EOSINOPHIL #: 0 10*3/uL (ref 0.00–0.50)
EOSINOPHIL %: 0 % (ref 0–5)
HCT: 34.7 % — ABNORMAL LOW (ref 36.0–45.0)
HGB: 11.9 g/dL — ABNORMAL LOW (ref 12.0–15.5)
LYMPHOCYTE #: 0.9 10*3/uL — ABNORMAL LOW (ref 1.00–4.80)
LYMPHOCYTE %: 17 % (ref 15–43)
MCH: 30 pg (ref 27.5–33.2)
MCHC: 34.2 g/dL (ref 32.0–36.0)
MCV: 87.6 fL (ref 82.0–97.0)
MONOCYTE #: 0.5 10*3/uL (ref 0.20–0.90)
MONOCYTE %: 8 % (ref 5–12)
MPV: 9.3 fL (ref 7.4–10.5)
NEUTROPHIL #: 4.2 10*3/uL (ref 1.50–6.50)
NEUTROPHIL %: 75 % (ref 43–76)
PLATELETS: 238 10*3/uL (ref 150–450)
RBC: 3.96 10*6/uL — ABNORMAL LOW (ref 4.00–5.10)
RDW: 14 % (ref 11.0–16.0)
WBC: 5.6 10*3/uL (ref 4.0–11.0)

## 2019-08-20 LAB — URINALYSIS, MICROSCOPIC: RBCS: >100 /hpf — AB (ref 0–2)

## 2019-08-20 LAB — COMPREHENSIVE METABOLIC PROFILE - BMC/JMC ONLY
ALBUMIN/GLOBULIN RATIO: 0.8 (ref 0.8–2.0)
ALBUMIN: 2.7 g/dL — ABNORMAL LOW (ref 3.5–5.0)
ALKALINE PHOSPHATASE: 67 U/L (ref 38–126)
ALT (SGPT): 23 U/L (ref 14–54)
ANION GAP: 11 mmol/L (ref 3–11)
AST (SGOT): 23 U/L (ref 15–41)
BILIRUBIN TOTAL: 0.7 mg/dL (ref 0.3–1.2)
BUN/CREA RATIO: 15 (ref 6–22)
BUN: 29 mg/dL — ABNORMAL HIGH (ref 6–20)
CALCIUM: 9.2 mg/dL (ref 8.6–10.3)
CHLORIDE: 100 mmol/L — ABNORMAL LOW (ref 101–111)
CO2 TOTAL: 23 mmol/L (ref 22–32)
CREATININE: 1.91 mg/dL — ABNORMAL HIGH (ref 0.44–1.00)
ESTIMATED GFR: 30 mL/min/{1.73_m2} — ABNORMAL LOW (ref >60)
GLUCOSE: 157 mg/dL — ABNORMAL HIGH (ref 70–110)
POTASSIUM: 4.3 mmol/L (ref 3.4–5.1)
PROTEIN TOTAL: 6.2 g/dL — ABNORMAL LOW (ref 6.4–8.3)
SODIUM: 134 mmol/L — ABNORMAL LOW (ref 136–145)

## 2019-08-20 LAB — URINALYSIS WITH MICROSCOPIC REFLEX IF INDICATED BMC/JMC ONLY
BILIRUBIN: NEGATIVE mg/dL
GLUCOSE: NEGATIVE mg/dL
KETONES: NEGATIVE mg/dL
NITRITE: POSITIVE — AB
PH: 6 (ref <8.0)
PROTEIN: 100 mg/dL — AB
SPECIFIC GRAVITY: 1.025 — ABNORMAL HIGH (ref <1.022)
UROBILINOGEN: 0.2 mg/dL (ref <=2.0)

## 2019-08-20 LAB — PT/INR
INR: 1.18
PROTHROMBIN TIME: 13.7 s — ABNORMAL HIGH (ref 9.4–12.5)

## 2019-08-20 LAB — PTT (PARTIAL THROMBOPLASTIN TIME): APTT: 31.4 s (ref 24.0–36.5)

## 2019-08-20 LAB — LACTIC ACID LEVEL W/ REFLEX FOR LEVEL >2.0: LACTIC ACID: 0.8 mmol/L (ref 0.5–2.0)

## 2019-08-20 LAB — SEDIMENTATION RATE: ERYTHROCYTE SEDIMENTATION RATE (ESR): 55 mm/h — ABNORMAL HIGH (ref 0–30)

## 2019-08-20 LAB — H & H
HCT: 30.9 % — ABNORMAL LOW (ref 36.0–45.0)
HGB: 10.5 g/dL — ABNORMAL LOW (ref 12.0–15.5)

## 2019-08-20 LAB — TROPONIN-I: TROPONIN I: <0.03 ng/mL (ref <=0.06)

## 2019-08-20 LAB — D-DIMER: D-DIMER: 317 ng/mL — ABNORMAL HIGH (ref <=232)

## 2019-08-20 LAB — POC FINGERSTICK GLUCOSE - BMC/JMC (RESULTS): GLUCOSE, POC: 302 mg/dL — ABNORMAL HIGH (ref 60–100)

## 2019-08-20 LAB — PROCALCITONIN: PROCALCITONIN: 0.15 ng/mL (ref <0.50)

## 2019-08-20 LAB — C-REACTIVE PROTEIN(CRP),INFLAMMATION: CRP INFLAMMATION: 36.5 mg/L — ABNORMAL HIGH (ref <=8.0)

## 2019-08-20 MED ORDER — TIOTROPIUM BROMIDE 18 MCG CAPSULE WITH INHALATION DEVICE
18.0000 ug | ORAL_CAPSULE | Freq: Every day | RESPIRATORY_TRACT | Status: DC
Start: 2019-08-20 — End: 2019-08-22
  Administered 2019-08-20: 15:00:00 via RESPIRATORY_TRACT
  Administered 2019-08-21 – 2019-08-22 (×2): 18 ug via RESPIRATORY_TRACT
  Filled 2019-08-20: qty 5

## 2019-08-20 MED ORDER — ALBUTEROL SULFATE HFA 90 MCG/ACTUATION AEROSOL INHALER
2.0000 | INHALATION_SPRAY | Freq: Three times a day (TID) | RESPIRATORY_TRACT | Status: DC
Start: 2019-08-20 — End: 2019-08-20
  Administered 2019-08-20: 0 via RESPIRATORY_TRACT
  Filled 2019-08-20: qty 6.7

## 2019-08-20 MED ORDER — CEFTRIAXONE 1 GRAM/50 ML IN DEXTROSE (ISO-OSMOT) INTRAVENOUS PIGGYBACK
1.0000 g | INJECTION | INTRAVENOUS | Status: DC
Start: 2019-08-21 — End: 2019-08-22
  Administered 2019-08-21: 1 g via INTRAVENOUS
  Administered 2019-08-21 – 2019-08-22 (×2): 0 g via INTRAVENOUS
  Administered 2019-08-22: 1 g via INTRAVENOUS
  Filled 2019-08-20 (×6): qty 50

## 2019-08-20 MED ORDER — ASCORBIC ACID (VITAMIN C) 500 MG TABLET
500.00 mg | ORAL_TABLET | Freq: Three times a day (TID) | ORAL | Status: DC
Start: 2019-08-20 — End: 2019-08-22
  Administered 2019-08-20 – 2019-08-22 (×6): 500 mg via ORAL
  Filled 2019-08-20 (×14): qty 1

## 2019-08-20 MED ORDER — PHENYLEPHRINE 0.25 %-MINERAL OIL 14 %-PETROLATM 74.9 % RECTAL OINTMENT
TOPICAL_OINTMENT | Freq: Four times a day (QID) | RECTAL | Status: DC | PRN
Start: 2019-08-20 — End: 2019-08-22
  Filled 2019-08-20: qty 28

## 2019-08-20 MED ORDER — DEXAMETHASONE 4 MG TABLET
6.0000 mg | ORAL_TABLET | Freq: Every day | ORAL | Status: DC
Start: 2019-08-20 — End: 2019-08-22
  Administered 2019-08-20 – 2019-08-22 (×3): 6 mg via ORAL
  Filled 2019-08-20 (×3): qty 2

## 2019-08-20 MED ORDER — NITROGLYCERIN 0.4 MG SUBLINGUAL TABLET
0.4000 mg | SUBLINGUAL_TABLET | SUBLINGUAL | Status: DC | PRN
Start: 2019-08-20 — End: 2019-08-22

## 2019-08-20 MED ORDER — SODIUM CHLORIDE 0.9 % IV BOLUS
1000.00 mL | INJECTION | Status: AC
Start: 2019-08-20 — End: 2019-08-20
  Administered 2019-08-20: 0 mL via INTRAVENOUS
  Administered 2019-08-20: 1000 mL via INTRAVENOUS

## 2019-08-20 MED ORDER — ATORVASTATIN 10 MG TABLET
20.00 mg | ORAL_TABLET | Freq: Every evening | ORAL | Status: DC
Start: 2019-08-20 — End: 2019-08-22
  Administered 2019-08-20 – 2019-08-21 (×2): 20 mg via ORAL
  Filled 2019-08-20 (×2): qty 2

## 2019-08-20 MED ORDER — PANTOPRAZOLE 40 MG INTRAVENOUS SOLUTION
40.0000 mg | Freq: Every day | INTRAVENOUS | Status: DC
Start: 2019-08-20 — End: 2019-08-22
  Administered 2019-08-20 – 2019-08-22 (×3): 40 mg via INTRAVENOUS
  Filled 2019-08-20 (×3): qty 10

## 2019-08-20 MED ORDER — CHOLECALCIFEROL (VITAMIN D3) 25 MCG (1,000 UNIT) TABLET
2000.0000 [IU] | ORAL_TABLET | Freq: Every day | ORAL | Status: DC
Start: 2019-08-20 — End: 2019-08-22
  Administered 2019-08-20 – 2019-08-22 (×3): 2000 [IU] via ORAL
  Filled 2019-08-20 (×6): qty 2

## 2019-08-20 MED ORDER — METOCLOPRAMIDE 5 MG/ML INJECTION SOLUTION
10.00 mg | INTRAMUSCULAR | Status: AC
Start: 2019-08-20 — End: 2019-08-20
  Administered 2019-08-20: 10 mg via INTRAVENOUS
  Filled 2019-08-20: qty 2

## 2019-08-20 MED ORDER — SODIUM CHLORIDE 0.9 % (FLUSH) INJECTION SYRINGE
10.0000 mL | INJECTION | INTRAMUSCULAR | Status: DC | PRN
Start: 2019-08-20 — End: 2019-08-22

## 2019-08-20 MED ORDER — DEXTROSE 10 % IN WATER (D10W) BOLUS
125.00 mL | INJECTION | INTRAVENOUS | Status: DC | PRN
Start: 2019-08-20 — End: 2019-08-22

## 2019-08-20 MED ORDER — PROMETHAZINE 25 MG TABLET
25.0000 mg | ORAL_TABLET | Freq: Four times a day (QID) | ORAL | Status: DC | PRN
Start: 2019-08-20 — End: 2019-08-22
  Filled 2019-08-20 (×4): qty 1

## 2019-08-20 MED ORDER — SODIUM CHLORIDE 0.9 % (FLUSH) INJECTION SYRINGE
10.0000 mL | INJECTION | Freq: Three times a day (TID) | INTRAMUSCULAR | Status: DC
Start: 2019-08-20 — End: 2019-08-22
  Administered 2019-08-20: 10 mL via INTRAVENOUS
  Administered 2019-08-20 – 2019-08-22 (×5): 0 mL via INTRAVENOUS

## 2019-08-20 MED ORDER — CEFTRIAXONE 1 GRAM/50 ML IN DEXTROSE (ISO-OSMOT) DUPLEX
1.0000 g | INJECTION | INTRAVENOUS | Status: DC
Start: 2019-08-20 — End: 2019-08-20
  Administered 2019-08-20: 1 g via INTRAVENOUS
  Administered 2019-08-20: 0 g via INTRAVENOUS
  Filled 2019-08-20: qty 50

## 2019-08-20 MED ORDER — SODIUM CHLORIDE 0.9% FLUSH BAG - 250 ML
INTRAVENOUS | Status: DC | PRN
Start: 2019-08-20 — End: 2019-08-22

## 2019-08-20 MED ORDER — INSULIN REGULAR HUMAN 100 UNIT/ML INJECTION SSIP - CITY
1.00 [IU] | Freq: Four times a day (QID) | INTRAMUSCULAR | Status: DC
Start: 2019-08-20 — End: 2019-08-22
  Administered 2019-08-20: 4 [IU] via SUBCUTANEOUS
  Administered 2019-08-20: 16:00:00 0 [IU] via SUBCUTANEOUS
  Administered 2019-08-21: 3 [IU] via SUBCUTANEOUS
  Administered 2019-08-21: 20:00:00 4 [IU] via SUBCUTANEOUS
  Administered 2019-08-21: 3 [IU] via SUBCUTANEOUS
  Administered 2019-08-21: 2 [IU] via SUBCUTANEOUS
  Administered 2019-08-22: 1 [IU] via SUBCUTANEOUS
  Administered 2019-08-22: 10:00:00 3 [IU] via SUBCUTANEOUS
  Filled 2019-08-20 (×2): qty 3

## 2019-08-20 MED ORDER — DIPHENHYDRAMINE 50 MG/ML INJECTION SOLUTION
12.50 mg | INTRAMUSCULAR | Status: AC
Start: 2019-08-20 — End: 2019-08-20
  Administered 2019-08-20: 12.5 mg via INTRAVENOUS
  Filled 2019-08-20: qty 1

## 2019-08-20 MED ORDER — SODIUM CHLORIDE 0.9 % IV BOLUS
30.00 mL/kg | INJECTION | Status: AC
Start: 2019-08-20 — End: 2019-08-20
  Administered 2019-08-20: 1848 mL via INTRAVENOUS
  Administered 2019-08-20: 0 mL via INTRAVENOUS

## 2019-08-20 MED ORDER — LOSARTAN 100 MG TABLET
100.0000 mg | ORAL_TABLET | Freq: Every day | ORAL | Status: DC
Start: 2019-08-20 — End: 2019-08-22
  Administered 2019-08-20 – 2019-08-22 (×3): 100 mg via ORAL
  Filled 2019-08-20 (×6): qty 1

## 2019-08-20 MED ORDER — ONDANSETRON HCL (PF) 4 MG/2 ML INJECTION SOLUTION
4.0000 mg | Freq: Three times a day (TID) | INTRAMUSCULAR | Status: DC | PRN
Start: 2019-08-20 — End: 2019-08-22

## 2019-08-20 MED ORDER — AMLODIPINE 5 MG TABLET
5.0000 mg | ORAL_TABLET | Freq: Every day | ORAL | Status: DC
Start: 2019-08-20 — End: 2019-08-22
  Administered 2019-08-20 – 2019-08-22 (×3): 5 mg via ORAL
  Filled 2019-08-20 (×3): qty 1

## 2019-08-20 MED ORDER — SODIUM CHLORIDE 0.9 % INTRAVENOUS SOLUTION
INTRAVENOUS | Status: DC
Start: 2019-08-20 — End: 2019-08-22
  Administered 2019-08-20 – 2019-08-21 (×2): via INTRAVENOUS

## 2019-08-20 MED ORDER — ALBUTEROL SULFATE HFA 90 MCG/ACTUATION AEROSOL INHALER
2.0000 | INHALATION_SPRAY | RESPIRATORY_TRACT | Status: DC | PRN
Start: 2019-08-20 — End: 2019-08-22
  Administered 2019-08-21: 2 via RESPIRATORY_TRACT
  Filled 2019-08-20: qty 6.7

## 2019-08-20 MED ORDER — ZINC GLUCONATE 50 MG TABLET
50.0000 mg | ORAL_TABLET | Freq: Every day | ORAL | Status: DC
Start: 2019-08-20 — End: 2019-08-22
  Administered 2019-08-20 – 2019-08-22 (×3): 50 mg via ORAL
  Filled 2019-08-20 (×6): qty 1

## 2019-08-20 NOTE — Care Plan (Signed)
Problem: Adult Inpatient Plan of Care  Goal: Plan of Care Review  Outcome: Ongoing (see interventions/notes)  Goal: Patient-Specific Goal (Individualized)  Outcome: Ongoing (see interventions/notes)  Goal: Absence of Hospital-Acquired Illness or Injury  Outcome: Ongoing (see interventions/notes)  Intervention: Identify and Manage Fall Risk  Flowsheets (Taken 08/20/2019 1754)  Safety Promotion/Fall Prevention:   nonskid shoes/slippers when out of bed   safety round/check completed  Intervention: Prevent Skin Injury  Flowsheets (Taken 08/20/2019 1754)  Body Position: positioned/repositioned independently  Intervention: Prevent and Manage VTE (venous thromboembolism) Risk  Flowsheets (Taken 08/20/2019 1856)  VTE Prevention/Management: ambulation promoted  Intervention: Prevent Infection  Flowsheets (Taken 08/20/2019 1754)  Infection Prevention:   barrier precautions utilized   environmental surveillance performed   equipment surfaces disinfected   glycemic control managed   personal protective equipment utilized   promote handwashing   rest/sleep promoted   visitors restricted/screened   single patient room provided  Goal: Optimal Comfort and Wellbeing  Outcome: Ongoing (see interventions/notes)  Intervention: Provide Holloway (Taken 08/20/2019 1754)  Trust Relationship/Rapport:   care explained   questions answered   questions encouraged   thoughts/feelings acknowledged  Goal: Rounds/Family Conference  Outcome: Ongoing (see interventions/notes)     Problem: Fall Injury Risk  Goal: Absence of Fall and Fall-Related Injury  Outcome: Ongoing (see interventions/notes)  Intervention: Promote Copywriter, advertising (Taken 08/20/2019 1754)  Safety Promotion/Fall Prevention:   nonskid shoes/slippers when out of bed   safety round/check completed     Problem: COVID-19 Confirmed or Rule-Out  Description: Resident requires specific care to avoid complications secondary to COVID-19  Goal:  Resident will remain free of complications due to [id number]  Description: 1. Review and update the resident's isolation status in the Isolation activity  2. Keep the resident's door closed at all times and limit movement of the resident outside of the room to medically essential purposes. If applicable, transfer resident to a single-person room   3. Educate and reinforce infection prevention and control practices recommended by CDC  4. Frequently monitor for development of more severe symptoms   5. Use appropriate PPE when providing care for resident  6. Reinforce no visitor policy and non-essential health care personnel policy, except for certain compassionate care situations  7. If worsening of symptoms occur, alert the nearest Hospital caring for confirmed COVID-19 patients and arrange for transfer with proper precautions including placing a facemask on the resident during transfer  8. Communicate information about known or suspected case of COVID-19 to appropraite public health personnel  9. Avoid procedures that are likely to induce coughing (e.g., sputum induction, open suctioning of airways). If required, do so in a Airborne Infection Isolation Room. The health care provider in the room should wear an N95 or higher-level respirator, eye protection, gloves, and a gown. The number of HCP present during the procedure should be limited to only those essential for resident care and procedure support. Visitors should not be present for the procedure. Clean and disinfect procedure room surfaces promptly  10. Update resident, family, and resident representatives as needed      Outcome: Ongoing (see interventions/notes)

## 2019-08-20 NOTE — Nurses Notes (Signed)
08/20/19 [place] Fall Risk Assessment Tool   Fall Risk Assessment Shift Assessment   Fall Risk- Implement fall risk interventions per protocol 0   Age 53   Fall History 0   Elimination, Bowel and Urine 0   Medications (Fall risk drugs: PCA/Opiates, Anti-Convulsants, Anti-Hypertensives, Diuretics, Hypnotics, Laxatives, Sedatives and Psychotropics) 5   Patient Care Equipment 1   Mobility (Yes on any of these rows will result in High Fall Risk)   Requires assistance or supervision for mobility, transfer, or ambulation 0   Unsteady Gait 0   Visual or auditory impairment affecting mobility 0   Cognition (Yes on any of these rows will result in High Fall Risk)   Altered awareness of immediate physical environment 0   Impulsive 0   Lack of understanding of one's physical and cognitive limitations 0   [facility] Score and Interventions   [facility] Score Total 6   [facility] Identified Fall Risk Auto Moderate Risk (6-13 total points & absence of Yes in Mobility and Cognition Rows)   Fall Risk Interventions (All That Apply) (L,M,H) Universal Falls Precautions   SBAR   Patient ID/Code Status Verification (Scan Armband): [id number]   SBAR given: Yes   Handoff report given to: Ebony Hail, RN   Dashboard Updated Yes   Dashboard Reviewed Yes

## 2019-08-20 NOTE — ED Provider Notes (Signed)
[facility]  Emergency Department Visit Note    Date:  08/20/2019  Primary care provider:  Ocie Doyne, NP  Means of arrival:  private car  History obtained from: patient  History limited by: none    Chief Complaint:  Nausea vomiting diarrhea and rectal bleeding    HISTORY OF PRESENT ILLNESS     [name], date of birth April 13, 1966, is a 53 y.o. female who presents to the Emergency Department complaining of nausea, vomiting, diarrhea, and rectal bleeding.  Patient states that she tested COVID positive roughly 8 days ago (08/12/2019).  The patient was prescribed Zofran at this emergency department for treatment of her nausea and vomiting, however the last couple of days (08/18/2019) it has become ineffective.  In addition to vomiting, the patient has developed diarrhea.  Yesterday (08/19/2019) the patient started having some bright red blood per rectum with bouts of diarrhea.  The patient is noting abdominal discomfort in all 4 quadrants as well as rectal discomfort.  Patient has had a history of hemorrhoids.  The patient denies alleviating or aggravating factors.  She does state that the Zofran is become ineffective for management of her symptoms.  Patient is requesting further evaluation and management.  She rates her symptoms as moderate in intensity.    REVIEW OF SYSTEMS     The pertinent positive and negative symptoms are as per HPI. All other systems reviewed and are negative.     PATIENT HISTORY     Past Medical History:  Past Medical History:   Diagnosis Date   . Anxiety    . Arthritis    . Cellulitis 10/24/14    left foot   . 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 Depression    . Diabetes mellitus (CMS [place])    . Essential hypertension    . Fatty liver    . Headache(784.0)    . Hyperlipidemia    . Irritable bowel syndrome    . Macular edema    . Neck problem     good ROM   . Obesity    . Type 2 diabetes mellitus (CMS HCC)    . White coat hypertension           Past Surgical History:  Past Surgical History:   Procedure Laterality Date   . Ectopic pregnancy surgery  1999   . Hx cataract removal Bilateral    . Hx cholecystectomy  09/10/96   . Hx other  1996   . Hx pelvic laparoscopy  05/24/98   . Hx tubal ligation  09/10/2012   . Hymenectomy       Family History:  Family Medical History:     Problem Relation (Age of Onset)    Cancer Maternal Grandmother    Diabetes Father    Hypertension (High Blood Pressure) Maternal Uncle, Father        Social History:  Social History     Tobacco Use   . Smoking status: Never Smoker   . Smokeless tobacco: Never Used   Substance Use Topics   . Alcohol use: No   . Drug use: No     Social History     Substance and Sexual Activity   Drug Use No     Medications:  Current Outpatient Medications   Medication Sig   . amLODIPine (NORVASC) 2.5 mg Oral Tablet Take 5 mg by mouth Once a day    .  atorvastatin (LIPITOR) 20 mg Oral Tablet Take 20 mg by mouth Every evening   . Fish Oil-Omega-3 Fatty Acids 360-1,200 mg Oral Capsule Take 2 Caps by mouth Twice daily   . fluticasone (FLONASE) 50 mcg/actuation Nasal Spray, Suspension 1 Spray by Each Nostril route Once a day   . furosemide (LASIX) 20 mg Oral Tablet Take 40 mg by mouth Twice daily    . glipiZIDE (GLUCOTROL) 5 mg Oral Tablet Take 2.5 mg by mouth Twice a day before meals Take 30 minutes before meals   . loperamide (IMODIUM) 2 mg Oral Capsule Take 2 mg by mouth Every 4 hours as needed   . loratadine (CLARITIN) 10 mg Oral Tablet Take 10 mg by mouth Once per day as needed    . losartan (COZAAR) 25 mg Oral Tablet Take 100 mg by mouth Once a day    . magnesium oxide (MAG-OX) 400 mg (241.3 mg magnesium) Oral Tablet Take 250 mg by mouth Once a day      Allergies:  Allergies   Allergen Reactions   . Vicodin [Hydrocodone-Acetaminophen] Itching     PHYSICAL EXAM     Vitals:   08/20/19 0759 08/20/19 0915   BP: (!) 151/87 (!) 144/82   Pulse: 89 81   Resp:  18   Temp: 37.2 C (98.9 F)    SpO2: 92% 92%      Pulse ox  92% on None (Room Air) interpreted by me as: Normal    Constitutional: The patient is alert and oriented to person, place and time.  The patient is appropriately interactive with a nontoxic, but ill appearance.  The patient appears in no distress and is resting comfortably in the gurney..  Eyes: Pupils equal and round, reactive to light. There is normal, painless extraocular muscle motion.  ENT: Atraumatic. Normocephalic head. Mucous membranes moist.  Trachea is midline without stridor.  Neck: No JVD. No thyromegaly. No lymphadenopathy. Supple.  Lungs: Clear to auscultation bilaterally. Normal inspiratory:expiratory ratio. No respiratory distress.  Cardiovascular: Heart is S1-S2 regular rate and rhythm without murmur, click, gallop or rub.  Abdomen: Soft.  Abdominal tenderness. Non-distended. No evidence of rebound or guarding.  Extremities: No acute tenderness to palpation. No deformity. No abnormality of range of motion.  Spine: No midline or paraspinal muscle tenderness to palpation. No step-off.   Skin: No cyanosis, jaundice, rash or lesion.  Neurologic: Normal facial symmetry and speech.  5/5 upper and lower extremity strength.  2/4 patellar DTR's.  Light touch and pressure sensation is intact.  Vascular: Normal peripheral pulses with brisk capillary refill of less than 2 seconds.  Psychiatric: Normal affect. Normal insight. No evidence of psychosis.    DIAGNOSTIC STUDIES     Labs:    Results for orders placed or performed during the hospital encounter of 08/20/19   COMPREHENSIVE METABOLIC PROFILE - [facility] ONLY   Result Value Ref Range    SODIUM 134 (L) 136 - 145 mmol/L    POTASSIUM 4.3 3.4 - 5.1 mmol/L    CHLORIDE 100 (L) 101 - 111 mmol/L    CO2 TOTAL 23 22 - 32 mmol/L    ANION GAP 11 3 - 11 mmol/L    BUN 29 (H) 6 - 20 mg/dL    CREATININE 1.91 (H) 0.44 - 1.00 mg/dL    BUN/CREA RATIO 15 6 - 22    ESTIMATED GFR 30 (L) >60 mL/min/1.46m^2    ALBUMIN 2.7 (L) 3.5 - 5.0 g/dL    CALCIUM 9.2 8.6 -  10.3  mg/dL    GLUCOSE 157 (H) 70 - 110 mg/dL    ALKALINE PHOSPHATASE 67 38 - 126 U/L    ALT (SGPT) 23 14 - 54 U/L    AST (SGOT) 23 15 - 41 U/L    BILIRUBIN TOTAL 0.7 0.3 - 1.2 mg/dL    PROTEIN TOTAL 6.2 (L) 6.4 - 8.3 g/dL    ALBUMIN/GLOBULIN RATIO 0.8 0.8 - 2.0   PT/INR   Result Value Ref Range    PROTHROMBIN TIME 13.7 (H) 9.4 - 12.5 seconds    INR 1.18    PTT (PARTIAL THROMBOPLASTIN TIME)   Result Value Ref Range    APTT 31.4 24.0 - 36.5 seconds   URINALYSIS WITH MICROSCOPIC REFLEX IF INDICATED [facility] ONLY   Result Value Ref Range    COLOR Light Yellow Light Yellow, Straw, Yellow    APPEARANCE Clear Clear    PH 6.0 <8.0    LEUKOCYTES Trace (A) Negative WBCs/uL    NITRITE Positive (A) Negative    PROTEIN 100  (A) Negative, 10  mg/dL    GLUCOSE Negative Negative mg/dL    KETONES Negative Negative mg/dL    UROBILINOGEN 0.2  <=2.0 mg/dL    BILIRUBIN Negative Negative mg/dL    BLOOD Large (A) Negative mg/dL    SPECIFIC GRAVITY 1.025 (H) <1.022   TROPONIN-I   Result Value Ref Range    TROPONIN I <0.03 <=0.06 ng/mL   CBC WITH DIFF   Result Value Ref Range    WBC 5.6 4.0 - 11.0 x10^3/uL    RBC 3.96 (L) 4.00 - 5.10 x10^6/uL    HGB 11.9 (L) 12.0 - 15.5 g/dL    HCT 34.7 (L) 36.0 - 45.0 %    MCV 87.6 82.0 - 97.0 fL    MCH 30.0 27.5 - 33.2 pg    MCHC 34.2 32.0 - 36.0 g/dL    RDW 14.0 11.0 - 16.0 %    PLATELETS 238 150 - 450 x10^3/uL    MPV 9.3 7.4 - 10.5 fL    NEUTROPHIL % 75 43 - 76 %    LYMPHOCYTE % 17 15 - 43 %    MONOCYTE % 8 5 - 12 %    EOSINOPHIL % 0 0 - 5 %    BASOPHIL % 0 0 - 3 %    NEUTROPHIL # 4.20 1.50 - 6.50 x10^3/uL    LYMPHOCYTE # 0.90 (L) 1.00 - 4.80 x10^3/uL    MONOCYTE # 0.50 0.20 - 0.90 x10^3/uL    EOSINOPHIL # 0.00 0.00 - 0.50 x10^3/uL    BASOPHIL # 0.00 0.00 - 0.10 x10^3/uL   URINALYSIS, MICROSCOPIC   Result Value Ref Range    RBCS >100 (A) 0 - 2 /hpf    WBCS 10-20 (A) 0 - 2 /hpf    BACTERIA Slight (A) None /hpf    SQUAMOUS EPITHELIAL 0-2 0 - 2 /hpf     Labs reviewed and interpreted by me.    Radiology:  CT  ABDOMEN PELVIS WO IV CONTRAST   Final Result   1. No inflammatory process or mass lesions in the abdomen and pelvis.    2. There are subpleural patchy opacities in both lung bases concerning for   COVID related pneumonia.     Radiological imaging interpreted by radiologist and report reviewed by me.    ED PROGRESS NOTE / [place] records reviewed by me:  I have reviewed the nurse's notes. I  have reviewed the patient's problem list and pertinent past medical records.     Orders Placed This Encounter   . ADULT ROUTINE BLOOD CULTURE, SET OF 2 BOTTLES (BACTERIA AND YEAST)   . ADULT ROUTINE BLOOD CULTURE, SET OF 2 BOTTLES (BACTERIA AND YEAST)   . CT ABDOMEN PELVIS WO IV CONTRAST   . COMPREHENSIVE METABOLIC PROFILE - [facility] ONLY   . PT/INR   . PTT (PARTIAL THROMBOPLASTIN TIME)   . URINALYSIS WITH MICROSCOPIC REFLEX IF INDICATED [facility] ONLY   . TROPONIN-I   . CBC WITH DIFF   . URINALYSIS, MICROSCOPIC   . LACTIC ACID LEVEL W/ REFLEX FOR LEVEL >2.0   . metoclopramide (REGLAN) 5 mg/mL injection   . diphenhydrAMINE (BENADRYL) 50 mg/mL injection   . NS bolus infusion 1,000 mL   . NS bolus infusion 30 mL/kg (Ideal) = 1,848 mL   . cefTRIAXone (ROCEPHIN) 1 g in iso-osmotic 50 mL duplex IVPB     BLOOD CULTURE - [facility] ONLY - results pending at time of disposition  BLOOD CULTURE - [place] - results pending at time of disposition     08:36: Initial evaluation is complete at this time. I discussed with the patient that I would order CT abdomen pelvis, CBC, CMP, PT/INR, PTT, UA, and Troponin to further evaluate. Patient will be treated with Reglan, Benadryl, and 1 liter NS bolus. Patient is agreeable with the treatment plan at this time.     10:49: Patient has a nitrite positive UTI. On review of the XR, the patient also has pneumonia. Paging hospitalist at this time.    11:10: I discussed the patient's case and above findings with Dr. Jannetta Quint [facility]) who will make arrangements to admit for further treatment  and observation.    MIPS     Not applicable     OPIATE PRESCRIPTION      Not applicable    CORE MEASURES     Not applicable    CRITICAL CARE TIME     Not applicable     PRE-DISPOSITION VITALS      Pre-Disposition Vitals:  Filed Vitals:    08/20/19 0945 08/20/19 1000 08/20/19 1015 08/20/19 1100   BP: (!) 142/78 136/76 (!) 150/85 (!) 155/85   Pulse: 77 83 77 75   Resp: 18 18 16 18    Temp:       SpO2: 95% 96% 91% 95%     CLINICAL IMPRESSION     Covid   Pneumonia    DISPOSITION/PLAN     Admitted        Condition at Disposition: [place], SCRIBE scribed for Jonah Blue, DO on 08/20/2019 at 8:34 AM.     Documentation assistance provided for Jonah Blue, DO  by Francia Greaves, SCRIBE. Information recorded by the scribe was done at my direction and has been reviewed and validated by me Jonah Blue, DO.

## 2019-08-20 NOTE — Care Management Notes (Signed)
Medical necessity review

## 2019-08-20 NOTE — ED Nurses Note (Signed)
Rounded on patient.  Reviewed vital signs and treatment plan.  Asked if patient had any needs, especially in the area of toileting, pain management and general comfort.  Addressed issues.  Asked the patient/family if they had any needs before I left the room.  Indicated that I would be back within the hour to evaluate them again and update them on throughput progress.  Call bell within reach.

## 2019-08-20 NOTE — ED Nurses Note (Signed)
Report given to receiving RN.

## 2019-08-20 NOTE — ED Nurses Note (Addendum)
1st set of blood cultures drawn from [facility]     )site @  1100  2nd set of blood cultures drawn from [facility]    )site @ 1050     All sites are cleansed w/ 70% isopropyl alcohol pads, then vigorous cleansed w/ chlorascrub swabstick w/ one side of flat swab in a vigorous back and forth repeated stroke for 15-30 seconds, then turned over and repeated vigorous scrub for an additional 15-30 seconds. All areas selected for blood culture collections are prepped and then allow to dry for 30 seconds.     All bottles for blood culture collection are disinfected w/ an alcohol pad (one per bottle) and then allowed to air dry.

## 2019-08-20 NOTE — ED Nurses Note (Signed)
Patients SPO2 decreased to 88% on RA. Patient placed on 2L O2, SPO2 increased to 92%. Patient denies any SOB.

## 2019-08-20 NOTE — ED Nurses Note (Signed)
Awaiting pharmacy to send remainder of 1500 medications.

## 2019-08-20 NOTE — ED Nurses Note (Signed)
Patient observed sleeping at this time. No S/S of acute distress noted.

## 2019-08-20 NOTE — H&P (Addendum)
[facility]  [place], [place] [address]    Internal Medicine History and Physical    [name], May  Date of Admission:  08/20/2019  Date of Birth:  05/02/1966    PCP: Ocie Doyne, NP  Information Obtained from: patient    Chief Complaint:  Nausea vomiting    Assessment/Plan:   Persistent nausea, vomiting and dehydration:   --IVF, prn antiemetics, CT abdomen pelvis unremarkable, hold lasix for now    Diarrhea, blood in stool, h/o C diff in past, h/o hemmorhoids  --get GI bio fire to r/o C diff  --monitor h and h and transfuse as needed  --possibly from hemorrhoids, will hold off anticoagulation until stable hb/ c diff results  --will start PPI  --BUN chronically elevated, not significantly elevated as compared to before to indicate massive upper GI bleed to have frank blood in stool. Hb not significantly low as compared in past, running in 9s and 10s at baseline.    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  cefTRIAXone (ROCEPHIN) 1 g in iso-osmotic 50 mL duplex IVPB, 1 g, Intravenous, Q24H    .  NS bolus infusion 30 mL/kg (Ideal) = 1,848 mL, 30 mL/kg (Ideal), Intravenous, Now        HPI: [name] is a 53 y.o.,  female who presents with worsening nausea, vomiting and associated diarrhea.  covid 19+ on around 11/13. Had symptom a day prior.  Was in ER on 10/20 for nausea and vomiting and discharged on zofran  Symptoms getting worse for last few days  Having diarrhea and bleeding Per rectum as well. Bleeding like hemmrohiodal bleeds  as per her. Has h/o hemorrhoids. H/o C diff in past.  Saturating 88 percent on RA in ER, required 2l O2 and saturating 95%, Denies shortness of breath.  CT abdomen pelvis and chest done wo contrast, no acute changes, pneumonia like changes  Got rocephin for UTI in ER  Cultures sent from ER.  Will be admitted for persistent nausea, vomiting and diarrhea and hypoxia    ROS: Other than ROS in the HPI, all other systems were negative.    Physical Exam:  Temperature: 37.2 C (98.9 F)  Heart Rate: 75  BP (Non-Invasive): (!) 155/85  Respiratory Rate: 18  SpO2: 95 %  BP (!) 155/85   Pulse 75   Temp 37.2 C (98.9 F)   Resp 18   Ht 1.702 m (5\' 7" )   Wt 133 kg (293 lb)   SpO2 95%   BMI 45.89 kg/m       General: VS as above. NAD  HEENT: Pupils equal and round , Oral mucosa moist, External ear normal  Neck: No JVD or thyromegaly or lymphadenopathy   Lungs: Chest clear to auscultation bilaterally. No accessory muscles use. No wheezing. + crackles at bases  Cardiovascular: regular rate and rhythm, S1, S2 heard, Pulse with good volume  Abdomen: Soft, non-tender, bowel  sounds heard, no gross distension  Extremities: Atraumatic, No cyanosis, LE edema  Skin: Skin warm and dry   Neurologic: Alert and appropriate during communication, moves all extremities spontaneously, normal speech, no facial deviation noticed  Lymphatics: No  Cervical lymphadenopathy   Psychiatric: calm, cooperative and engaging in conversation, speech seems appropriately articulated      Pertinent Labs and Imaging during admission reviewed.   yes    Past Medical History:   Diagnosis Date   . Anxiety    . Arthritis    . Cellulitis 10/24/14    left foot   . 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 Depression    . Diabetes mellitus (CMS [place])    . Essential hypertension    . Fatty liver    . Headache(784.0)    . Hyperlipidemia    . Irritable bowel syndrome    . Macular edema    . Neck problem     good ROM   . Obesity    . Type 2 diabetes  mellitus (CMS HCC)    . White coat hypertension            There are no active hospital problems to display for this patient.      Past Surgical History:   Procedure Laterality Date   . ECTOPIC PREGNANCY SURGERY  1999   . HX CATARACT REMOVAL Bilateral    . HX CHOLECYSTECTOMY  09/10/96   . 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 HX PELVIC LAPAROSCOPY  05/24/98    Ectopic pregnancy   . HX TUBAL LIGATION  09/10/2012    Filshie Clips   . HYMENECTOMY             Medications Prior to Admission     Prescriptions    amLODIPine (NORVASC) 2.5 mg Oral Tablet    Take 5 mg by mouth Once a day     atorvastatin (LIPITOR) 20 mg Oral Tablet    Take 20 mg by mouth Every evening    Fish Oil-Omega-3 Fatty Acids 360-1,200 mg Oral Capsule    Take 2 Caps by mouth Twice daily    fluticasone (FLONASE) 50 mcg/actuation Nasal Spray, Suspension    1 Spray by Each Nostril route Once a day    furosemide (LASIX) 20 mg Oral Tablet    Take 40 mg by mouth Twice daily     glipiZIDE (GLUCOTROL) 5 mg Oral Tablet    Take 2.5 mg by mouth Twice a day before meals Take 30 minutes before meals    loperamide (IMODIUM) 2 mg Oral Capsule    Take 2 mg by mouth Every 4 hours as needed    loratadine (CLARITIN) 10 mg Oral Tablet    Take 10 mg by mouth Once per day as needed     losartan (COZAAR) 25 mg Oral Tablet    Take 100 mg by mouth Once a day     magnesium oxide (MAG-OX) 400 mg (241.3 mg magnesium) Oral Tablet    Take 250 mg by mouth Once a day     ondansetron (ZOFRAN ODT) 4 mg Oral Tablet, Rapid Dissolve    Take 1 Tab (4 mg total) by mouth Every 8 hours as needed for Nausea/Vomiting for up to 4 days          Allergies   Allergen Reactions   . Vicodin [Hydrocodone-Acetaminophen] Itching       Social History  Tobacco Use   . Smoking status: Never Smoker   . Smokeless tobacco: Never Used   Substance Use Topics   . Alcohol use: No       Family Medical History:     Problem Relation (Age of Onset)    Cancer Maternal Grandmother    Diabetes Father    Hypertension (High  Blood Pressure) Maternal Uncle, Father            Portions of this note may be dictated using voice recognition software or a dictation service. Variances in spelling and vocabulary are possible and unintentional. Not all errors are caught/corrected. Please notify the [facility] if any discrepancies are noted or if the meaning of any statement is not clear.     Demetrius Revel, MD

## 2019-08-20 NOTE — ED Nurses Note (Signed)
Patient provided with a ginger ale at this time.

## 2019-08-20 NOTE — ED Nurses Note (Signed)
Patient provided with PO water and warm blankets for comfort. Patient continues to deny SOB at this time. No S/S of acute distress noted. Call bell within reach.

## 2019-08-20 NOTE — ED Nurses Note (Signed)
Report given to Katie, RN.

## 2019-08-20 NOTE — ED Triage Notes (Addendum)
Pt tested positive on 11/16 for COVID still having vomiting and diarrhea. Pt with low sats in triage but states that she is not SOB.

## 2019-08-21 ENCOUNTER — Inpatient Hospital Stay (HOSPITAL_COMMUNITY): Payer: 59

## 2019-08-21 ENCOUNTER — Other Ambulatory Visit: Payer: Self-pay

## 2019-08-21 LAB — FERRITIN: FERRITIN: 181 ng/mL (ref 11–307)

## 2019-08-21 LAB — POC FINGERSTICK GLUCOSE - BMC/JMC (RESULTS)
GLUCOSE, POC: 231 mg/dL — ABNORMAL HIGH (ref 60–100)
GLUCOSE, POC: 269 mg/dL — ABNORMAL HIGH (ref 60–100)
GLUCOSE, POC: 315 mg/dL — ABNORMAL HIGH (ref 60–100)
GLUCOSE, POC: 319 mg/dL — ABNORMAL HIGH (ref 60–100)
GLUCOSE, POC: 342 mg/dL — ABNORMAL HIGH (ref 60–100)

## 2019-08-21 LAB — CBC WITH DIFF
BASOPHIL #: 0 10*3/uL (ref 0.00–0.10)
BASOPHIL %: 0 % (ref 0–3)
EOSINOPHIL #: 0 10*3/uL (ref 0.00–0.50)
EOSINOPHIL %: 0 % (ref 0–5)
HCT: 31.5 % — ABNORMAL LOW (ref 36.0–45.0)
HGB: 10.8 g/dL — ABNORMAL LOW (ref 12.0–15.5)
LYMPHOCYTE #: 0.5 10*3/uL — ABNORMAL LOW (ref 1.00–4.80)
LYMPHOCYTE %: 14 % — ABNORMAL LOW (ref 15–43)
MCH: 29.8 pg (ref 27.5–33.2)
MCHC: 34.3 g/dL (ref 32.0–36.0)
MCV: 87 fL (ref 82.0–97.0)
MONOCYTE #: 0.2 10*3/uL (ref 0.20–0.90)
MONOCYTE %: 7 % (ref 5–12)
MPV: 10 fL (ref 7.4–10.5)
NEUTROPHIL #: 2.8 10*3/uL (ref 1.50–6.50)
NEUTROPHIL %: 79 % — ABNORMAL HIGH (ref 43–76)
PLATELETS: 245 10*3/uL (ref 150–450)
RBC: 3.62 10*6/uL — ABNORMAL LOW (ref 4.00–5.10)
RDW: 14.1 % (ref 11.0–16.0)
WBC: 3.5 10*3/uL — ABNORMAL LOW (ref 4.0–11.0)

## 2019-08-21 LAB — D-DIMER: D-DIMER: 340 ng/mL — ABNORMAL HIGH (ref <=232)

## 2019-08-21 LAB — PROCALCITONIN: PROCALCITONIN: 0.06 ng/mL (ref <0.50)

## 2019-08-21 LAB — LDH: LDH: 210 U/L — ABNORMAL HIGH (ref 98–192)

## 2019-08-21 LAB — C-REACTIVE PROTEIN(CRP),INFLAMMATION: CRP INFLAMMATION: 29.1 mg/L — ABNORMAL HIGH (ref <=8.0)

## 2019-08-21 MED ORDER — HEPARIN (PORCINE) 5,000 UNIT/ML INJECTION SOLUTION
5000.00 [IU] | Freq: Three times a day (TID) | INTRAMUSCULAR | Status: DC
Start: 2019-08-21 — End: 2019-08-22
  Administered 2019-08-21 – 2019-08-22 (×3): 5000 [IU] via SUBCUTANEOUS
  Filled 2019-08-21 (×3): qty 1

## 2019-08-21 MED ORDER — HYDRALAZINE 20 MG/ML INJECTION SOLUTION
5.00 mg | Freq: Four times a day (QID) | INTRAMUSCULAR | Status: DC | PRN
Start: 2019-08-21 — End: 2019-08-22
  Administered 2019-08-22: 06:00:00 5 mg via INTRAVENOUS
  Filled 2019-08-21 (×4): qty 0.25

## 2019-08-21 MED ORDER — INSULIN GLARGINE (U-100) 100 UNIT/ML SUBCUTANEOUS SOLUTION
10.00 [IU] | Freq: Every evening | SUBCUTANEOUS | Status: DC
Start: 2019-08-21 — End: 2019-08-22
  Administered 2019-08-21: 10 [IU] via SUBCUTANEOUS

## 2019-08-21 MED ORDER — INSULIN GLARGINE (U-100) 100 UNIT/ML SUBCUTANEOUS SOLUTION
8.00 [IU] | Freq: Every evening | SUBCUTANEOUS | Status: DC
Start: 2019-08-21 — End: 2019-08-21
  Filled 2019-08-21: qty 200

## 2019-08-21 NOTE — Nurses Notes (Signed)
08/21/19 0728   [facility] Fall Risk Assessment Tool   Fall Risk Assessment Shift Assessment   Fall Risk- Implement fall risk interventions per protocol 0   Age 53   Fall History 0   Elimination, Bowel and Urine 0   Medications (Fall risk drugs: PCA/Opiates, Anti-Convulsants, Anti-Hypertensives, Diuretics, Hypnotics, Laxatives, Sedatives and Psychotropics) 5   Patient Care Equipment 1   Mobility (Yes on any of these rows will result in High Fall Risk)   Requires assistance or supervision for mobility, transfer, or ambulation 0   Unsteady Gait 0   Visual or auditory impairment affecting mobility 0   Cognition (Yes on any of these rows will result in High Fall Risk)   Altered awareness of immediate physical environment 0   Impulsive 0   Lack of understanding of one's physical and cognitive limitations 0   [facility] Score and Interventions   [facility] Score Total 6   [facility] Identified Fall Risk Auto Moderate Risk (6-13 total points & absence of Yes in Mobility and Cognition Rows)   Fall Risk Interventions (All That Apply) (L,M,H) Universal Falls Precautions;(M,H) Institute/Continue Flagging System;(M,H) Monitor and assist with ADLs   SBAR   Patient ID/Code Status Verification (Scan Armband): [id number]   SBAR given: Yes   Handoff report given to: Lauren RN   Dashboard Updated Yes   Dashboard Reviewed Yes

## 2019-08-21 NOTE — Progress Notes (Signed)
IP PROGRESS NOTE      May, [name]  Date of Admission:  08/20/2019  Date of Birth:  [birth year]  Date of Service:  08/21/2019    Chief Complaint:   Nausea and vomiting is much better has not had any listed last night  Subjective:   Patient seen and examined follow-up for COVID-19 pneumonia.  Nausea vomiting which now has resolved will advance diet.      Problem List:  Active Hospital Problems   (*Primary Problem)    Diagnosis   . Persistent vomiting     Assessment/ Plan:     1. Nausea and vomiting      This has improved      Advanced diet      CT abdomen pelvis unremarkable    2. COVID-19 pneumonia      Patient is saturating 88% on room air on ambulation so qualifies for O2      Continue with vitamin C vitamin D zinc      Continue with dexamethasone    3. CKD stage 3      IV hydration    4. Diabetes mellitus type 2      BG 230-260s      Increase the dose of Lantus, continue with sliding scale insulin    5. Hypertension      Blood pressure is high      Continue with home medications of amlodipine and losartan      Can be also in the setting of nausea vomiting      Added p.r.n. hydralazine    6. Hyperlipidemia     Continue with atorvastatin    7. History of Hemorrhoidal bleed      Monitor H&H seems to be at baseline        Prophylaxis SCDs  Disposition anticipate discharge once oxygen is arranged    Vital Signs:  Temp (24hrs) Max:36.9 C (98.5 F)      Temperature: 36.4 C (97.5 F)  BP (Non-Invasive): (!) 173/90  MAP (Non-Invasive): 111 mmHG  Heart Rate: 62  Respiratory Rate: 16  Pain Score (Numeric, Faces): 0  SpO2: 92 %    Current Medications:    .  albuterol 90 mcg per inhalation oral inhaler - "Respiratory to administer", 2 Puff, Inhalation, Q4H PRN    .  amLODIPine (NORVASC) tablet, 5 mg, Oral, Daily    .  ascorbic acid (VITAMIN C) tablet, 500 mg, Oral, 3x/day    .  atorvastatin (LIPITOR) tablet, 20 mg, Oral, QPM    .  cefTRIAXone (ROCEPHIN) 1 g in iso-osmotic 50 mL premix IVPB, 1 g, Intravenous, Q24H    .   cholecalciferol (VITAMIN D3) 1000 unit (25 mcg) tablet, 2,000 Units, Oral, Daily    .  SSIP insulin R human 100 units/mL injection, 1-12 Units, Subcutaneous, 4x/day AC    And    .  D10W bolus infusion 125 mL, 125 mL, Intravenous, Q15 Min PRN    .  dexAMETHasone (DECADRON) tablet, 6 mg, Oral, Daily    .  insulin glargine (LANTUS) 100 units/mL injection, 8 Units, Subcutaneous, NIGHTLY    .  losartan (COZAAR) tablet, 100 mg, Oral, Daily    .  nitroGLYCERIN (NITROSTAT) sublingual tablet, 0.4 mg, Sublingual, Q5 Min PRN    .  NS 250 mL flush bag, , Intravenous, Q1H PRN    .  NS flush syringe, 10 mL, Intravenous, Q8HRS    .  NS flush syringe, 10 mL, Intravenous, Q1H PRN    .  NS premix infusion, , Intravenous, Continuous    .  ondansetron (ZOFRAN) 2 mg/mL injection, 4 mg, Intravenous, Q8H PRN    .  pantoprazole (PROTONIX) injection, 40 mg, Intravenous, Daily    .  phenylephrine-mineral oil-petrolatum (PREPARATION H) rectal ointment, , Rectal, 4x/day PRN    .  promethazine (PHENERGAN) tablet, 25 mg, Oral, Q6H PRN    .  tiotropium bromide (SPIRIVA HANDIHALER) 18 mcg cap - "Respiratory to administer", 18 mcg, Inhalation, Daily    .  zinc gluconate tablet, 50 mg, Oral, Daily        Today's Physical Exam:  General: appears in good health. No distress.   Eyes: Pupils equal and round, reactive to light and accomodation.   HENT:Head atraumatic and normocephalic   Neck: No JVD or thyromegaly or lymphadenopathy   Lungs: CTAB, non labored breathing, no rales or wheezing.    Cardiovascular: regular rate and rhythm, S1, S2 normal, no murmur,   Abdomen: Soft, non-tender, Bowel sounds normal, No hepatosplenomegaly   Extremities: extremities normal, atraumatic, no cyanosis or edema   Skin: Skin warm and dry   Neurologic: Grossly normal   Psychiatric: Normal affect, behavior,         I/O:  I/O last 24 hours:  No intake or output data in the 24 hours ending 08/21/19 1417  I/O current shift:  No intake/output data recorded.      Labs  Please  indicate ordered or reviewed)  Reviewed: I have reviewed all lab results.    Radiology Tests (Please indicate ordered or reviewed)  Reviewed:   1. No inflammatory process or mass lesions in the abdomen and pelvis.   2. There are subpleural patchy opacities in both lung bases concerning for  COVID related pneumonia.

## 2019-08-21 NOTE — Nurses Notes (Signed)
Pt. Sat @ 91 RA. Ambulated to bathroom and walked around room. Sat @ 88. Increased to 92 within a few minutes.

## 2019-08-21 NOTE — Care Plan (Signed)
Problem: Adult Inpatient Plan of Care  Goal: Plan of Care Review  Outcome: Ongoing (see interventions/notes)  Flowsheets (Taken 08/21/2019 0048)  Plan of Care Reviewed With: patient  Progress: improving     Problem: Fall Injury Risk  Goal: Absence of Fall and Fall-Related Injury  Outcome: Ongoing (see interventions/notes)  Intervention: Identify and Manage Contributors to Fall Injury Risk  Flowsheets (Taken 08/20/2019 2032)  Self-Care Promotion:   independence encouraged   BADL personal objects within reach   BADL personal routines maintained   safe use of adaptive equipment encouraged  Medication Review/Management:   medications reviewed   high-risk medications identified  Intervention: Promote [place] (Taken 08/20/2019 2032)  Safety Promotion/Fall Prevention:   nonskid shoes/slippers when out of bed   fall prevention program maintained   safety round/check completed     Problem: COVID-19 Confirmed or Rule-Out  Description: Resident requires specific care to avoid complications secondary to COVID-19  Goal: Resident will remain free of complications due to [id number]  Description: 1. Review and update the resident's isolation status in the Isolation activity  2. Keep the resident's door closed at all times and limit movement of the resident outside of the room to medically essential purposes. If applicable, transfer resident to a single-person room   3. Educate and reinforce infection prevention and control practices recommended by CDC  4. Frequently monitor for development of more severe symptoms   5. Use appropriate PPE when providing care for resident  6. Reinforce no visitor policy and non-essential health care personnel policy, except for certain compassionate care situations  7. If worsening of symptoms occur, alert the nearest Hospital caring for confirmed COVID-19 patients and arrange for transfer with proper precautions including placing a facemask on the resident during  transfer  8. Communicate information about known or suspected case of COVID-19 to appropraite public health personnel  9. Avoid procedures that are likely to induce coughing (e.g., sputum induction, open suctioning of airways). If required, do so in a Airborne Infection Isolation Room. The health care provider in the room should wear an N95 or higher-level respirator, eye protection, gloves, and a gown. The number of HCP present during the procedure should be limited to only those essential for resident care and procedure support. Visitors should not be present for the procedure. Clean and disinfect procedure room surfaces promptly  10. Update resident, family, and resident representatives as needed      Outcome: Ongoing (see interventions/notes)     Discussed with patient to call for assistance with needs to prevent falls or injury this shift. Also discussed pain management, medications and times available. Also informed patient of hourly rounding to be done by this nurse. Call bell in reach. Appears to be resting comfortably at this time. No needs noted. Will continue to monitor.

## 2019-08-22 LAB — CBC
HCT: 35.4 % — ABNORMAL LOW (ref 36.0–45.0)
HGB: 12 g/dL (ref 12.0–15.5)
MCH: 29.6 pg (ref 27.5–33.2)
MCHC: 33.9 g/dL (ref 32.0–36.0)
MCV: 87.5 fL (ref 82.0–97.0)
MPV: 9.5 fL (ref 7.4–10.5)
PLATELETS: 335 10*3/uL (ref 150–450)
RBC: 4.05 10*6/uL (ref 4.00–5.10)
RDW: 14 % (ref 11.0–16.0)
WBC: 7.3 10*3/uL (ref 4.0–11.0)

## 2019-08-22 LAB — BASIC METABOLIC PANEL
ANION GAP: 11 mmol/L (ref 3–11)
BUN/CREA RATIO: 16 (ref 6–22)
BUN: 25 mg/dL — ABNORMAL HIGH (ref 6–20)
CALCIUM: 9 mg/dL (ref 8.6–10.3)
CHLORIDE: 102 mmol/L (ref 101–111)
CO2 TOTAL: 19 mmol/L — ABNORMAL LOW (ref 22–32)
CREATININE: 1.59 mg/dL — ABNORMAL HIGH (ref 0.44–1.00)
ESTIMATED GFR: 37 mL/min/{1.73_m2} — ABNORMAL LOW (ref >60)
GLUCOSE: 276 mg/dL — ABNORMAL HIGH (ref 70–110)
POTASSIUM: 4.2 mmol/L (ref 3.4–5.1)
SODIUM: 132 mmol/L — ABNORMAL LOW (ref 136–145)

## 2019-08-22 LAB — POC FINGERSTICK GLUCOSE - BMC/JMC (RESULTS)
GLUCOSE, POC: 293 mg/dL — ABNORMAL HIGH (ref 60–100)
GLUCOSE, POC: 343 mg/dL — ABNORMAL HIGH (ref 60–100)

## 2019-08-22 MED ORDER — ZINC GLUCONATE 50 MG PO TABS
50.00 mg | ORAL_TABLET | ORAL | Status: DC
Start: 2019-08-23 — End: 2019-08-22

## 2019-08-22 MED ORDER — PANTOPRAZOLE SODIUM 40 MG PO TBEC
40.00 mg | DELAYED_RELEASE_TABLET | ORAL | Status: DC
Start: 2019-08-23 — End: 2019-08-22

## 2019-08-22 MED ORDER — PROMETHAZINE HCL 25 MG PO TABS
25.00 mg | ORAL_TABLET | ORAL | Status: DC
Start: ? — End: 2019-08-22

## 2019-08-22 MED ORDER — AMLODIPINE BESYLATE 5 MG PO TABS
5.00 mg | ORAL_TABLET | ORAL | Status: DC
Start: 2019-08-23 — End: 2019-08-22

## 2019-08-22 MED ORDER — PHENYLEPHRINE-MINERAL OIL-PET 0.25-14-74.9 % RE OINT
TOPICAL_OINTMENT | RECTAL | Status: DC
Start: ? — End: 2019-08-22

## 2019-08-22 MED ORDER — SODIUM CHLORIDE FLUSH 0.9 % IV SOLN
10.00 mL | INTRAVENOUS | Status: DC
Start: 2019-08-22 — End: 2019-08-22

## 2019-08-22 MED ORDER — ALBUTEROL SULFATE HFA 108 (90 BASE) MCG/ACT IN AERS
2.00 | INHALATION_SPRAY | RESPIRATORY_TRACT | Status: DC
Start: ? — End: 2019-08-22

## 2019-08-22 MED ORDER — SODIUM CHLORIDE FLUSH 0.9 % IV SOLN
10.00 mL | INTRAVENOUS | Status: DC
Start: ? — End: 2019-08-22

## 2019-08-22 MED ORDER — HYDRALAZINE HCL 20 MG/ML IJ SOLN
5.00 mg | INTRAMUSCULAR | Status: DC
Start: ? — End: 2019-08-22

## 2019-08-22 MED ORDER — INSULIN GLARGINE 100 UNIT/ML SC SOLN
10.00 [IU] | SUBCUTANEOUS | Status: DC
Start: 2019-08-22 — End: 2019-08-22

## 2019-08-22 MED ORDER — ATORVASTATIN CALCIUM 10 MG PO TABS
20.00 mg | ORAL_TABLET | ORAL | Status: DC
Start: 2019-08-22 — End: 2019-08-22

## 2019-08-22 MED ORDER — CEFTRIAXONE SODIUM-DEXTROSE 1-3.74 GM-%(50ML) IV SOLR
1.00 g | INTRAVENOUS | Status: DC
Start: 2019-08-23 — End: 2019-08-22

## 2019-08-22 MED ORDER — TIOTROPIUM BROMIDE MONOHYDRATE 18 MCG IN CAPS
18.00 ug | ORAL_CAPSULE | RESPIRATORY_TRACT | Status: DC
Start: 2019-08-23 — End: 2019-08-22

## 2019-08-22 MED ORDER — NITROGLYCERIN 0.4 MG SL SUBL
0.40 mg | SUBLINGUAL_TABLET | SUBLINGUAL | Status: DC
Start: ? — End: 2019-08-22

## 2019-08-22 MED ORDER — GENERIC EXTERNAL MEDICATION
Status: DC
Start: ? — End: 2019-08-22

## 2019-08-22 MED ORDER — DEXAMETHASONE 4 MG PO TABS
6.00 mg | ORAL_TABLET | ORAL | Status: DC
Start: 2019-08-23 — End: 2019-08-22

## 2019-08-22 MED ORDER — HEPARIN SODIUM (PORCINE) 5000 UNIT/ML IJ SOLN
5000.00 [IU] | INTRAMUSCULAR | Status: DC
Start: 2019-08-22 — End: 2019-08-22

## 2019-08-22 MED ORDER — LOSARTAN POTASSIUM 100 MG PO TABS
100.00 mg | ORAL_TABLET | ORAL | Status: DC
Start: 2019-08-23 — End: 2019-08-22

## 2019-08-22 MED ORDER — SODIUM CHLORIDE 0.9 % IV SOLN
INTRAVENOUS | Status: DC
Start: ? — End: 2019-08-22

## 2019-08-22 MED ORDER — ONDANSETRON HCL 4 MG/2ML IJ SOLN
4.00 mg | INTRAMUSCULAR | Status: DC
Start: ? — End: 2019-08-22

## 2019-08-22 MED ORDER — ALBUTEROL SULFATE HFA 90 MCG/ACTUATION AEROSOL INHALER
2.0000 | INHALATION_SPRAY | RESPIRATORY_TRACT | 0 refills | Status: DC | PRN
Start: 2019-08-22 — End: 2023-07-06

## 2019-08-22 MED ORDER — ASCORBIC ACID (VITAMIN C) 500 MG TABLET
500.00 mg | ORAL_TABLET | Freq: Three times a day (TID) | ORAL | 0 refills | Status: AC
Start: 2019-08-22 — End: 2019-09-21

## 2019-08-22 MED ORDER — CHOLECALCIFEROL (VITAMIN D3) 25 MCG (1,000 UNIT) TABLET
2000.0000 [IU] | ORAL_TABLET | Freq: Every day | ORAL | 0 refills | Status: AC
Start: 2019-08-23 — End: 2019-09-22

## 2019-08-22 MED ORDER — PANTOPRAZOLE 40 MG TABLET,DELAYED RELEASE
40.00 mg | DELAYED_RELEASE_TABLET | Freq: Every day | ORAL | Status: DC
Start: 2019-08-23 — End: 2019-08-22

## 2019-08-22 MED ORDER — PANTOPRAZOLE 40 MG TABLET,DELAYED RELEASE
40.00 mg | DELAYED_RELEASE_TABLET | Freq: Every day | ORAL | 0 refills | Status: AC
Start: 2019-08-23 — End: 2019-09-22

## 2019-08-22 MED ORDER — ZINC GLUCONATE 50 MG TABLET
50.0000 mg | ORAL_TABLET | Freq: Every day | ORAL | 0 refills | Status: AC
Start: 2019-08-23 — End: 2019-09-22

## 2019-08-22 MED ORDER — DEXAMETHASONE 6 MG TABLET
6.0000 mg | ORAL_TABLET | Freq: Every day | ORAL | 0 refills | Status: AC
Start: 2019-08-23 — End: 2019-08-30

## 2019-08-22 MED ORDER — CEFUROXIME AXETIL 500 MG TABLET
500.00 mg | ORAL_TABLET | Freq: Two times a day (BID) | ORAL | 0 refills | Status: AC
Start: 2019-08-22 — End: 2019-08-26

## 2019-08-22 MED ORDER — LOSARTAN 25 MG TABLET
50.0000 mg | ORAL_TABLET | Freq: Every day | ORAL | Status: DC
Start: 2019-08-22 — End: 2020-03-24

## 2019-08-22 MED ORDER — TIOTROPIUM BROMIDE 18 MCG CAPSULE WITH INHALATION DEVICE
18.0000 ug | ORAL_CAPSULE | Freq: Every day | RESPIRATORY_TRACT | 0 refills | Status: DC
Start: 2019-08-23 — End: 2020-08-26

## 2019-08-22 MED ORDER — LOSARTAN 25 MG TABLET
50.0000 mg | ORAL_TABLET | Freq: Every day | ORAL | Status: DC
Start: 2019-08-22 — End: 2019-08-22

## 2019-08-22 NOTE — Respiratory Therapy (Signed)
08/22/19 0730        Home Oxygen Assessment   O2 sat on Room Air at Rest 97   O2 sat on Room Air while Ambulating 95   Exercise for desaturation was completed. Patient tolerated procedure well.

## 2019-08-22 NOTE — Nurses Notes (Signed)
Pt denies pain, N/V/D, SOB. Tolerating regular diet. Reviewed AVS. No additional questions at this time. Patient discharged to home.

## 2019-08-22 NOTE — Nurses Notes (Signed)
Patient has been on RA throughout the shift, maintaining sats in the 90's. Independent to the restroom. Denies pain N/V/D.

## 2019-08-22 NOTE — Care Plan (Signed)
Problem: Adult Inpatient Plan of Care  Goal: Plan of Care Review  Outcome: Ongoing (see interventions/notes)  Goal: Patient-Specific Goal (Individualized)  Outcome: Ongoing (see interventions/notes)  Goal: Absence of Hospital-Acquired Illness or Injury  Outcome: Ongoing (see interventions/notes)     Problem: COVID-19 Confirmed or Rule-Out  Description: Resident requires specific care to avoid complications secondary to COVID-19  Goal: Resident will remain free of complications due to [id number]  Description: 1. Review and update the resident's isolation status in the Isolation activity  2. Keep the resident's door closed at all times and limit movement of the resident outside of the room to medically essential purposes. If applicable, transfer resident to a single-person room   3. Educate and reinforce infection prevention and control practices recommended by CDC  4. Frequently monitor for development of more severe symptoms   5. Use appropriate PPE when providing care for resident  6. Reinforce no visitor policy and non-essential health care personnel policy, except for certain compassionate care situations  7. If worsening of symptoms occur, alert the nearest Hospital caring for confirmed COVID-19 patients and arrange for transfer with proper precautions including placing a facemask on the resident during transfer  8. Communicate information about known or suspected case of COVID-19 to appropraite public health personnel  9. Avoid procedures that are likely to induce coughing (e.g., sputum induction, open suctioning of airways). If required, do so in a Airborne Infection Isolation Room. The health care provider in the room should wear an N95 or higher-level respirator, eye protection, gloves, and a gown. The number of HCP present during the procedure should be limited to only those essential for resident care and procedure support. Visitors should not be present for the procedure. Clean and disinfect procedure  room surfaces promptly  10. Update resident, family, and resident representatives as needed      Outcome: Ongoing (see interventions/notes)

## 2019-08-22 NOTE — Discharge Summary (Signed)
[facility]    DISCHARGE SUMMARY      PATIENT NAME:  [name], [name]  MRN:  [id number]  DOB:  1965/11/27    ENCOUNTER START DATE:  08/20/2019  INPATIENT ADMISSION DATE: 08/20/2019  DISCHARGE DATE:  08/22/2019    ATTENDING PHYSICIAN: Justice Rocher, MD  PRIMARY CARE PHYSICIAN: Ocie Doyne, NP     ADMISSION DIAGNOSIS: <principal problem not specified>  Chief Complaint   Patient presents with   . Vomiting       DISCHARGE DIAGNOSIS:     Principal Problem  covid 19 PNA    Active Hospital Problems    Diagnosis Date Noted   . Persistent vomiting [R11.15] 08/20/2019      Resolved Hospital Problems   No resolved problems to display.     Active Non-Hospital Problems    Diagnosis Date Noted   . Morbid obesity 10/24/2017   . 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 Hyperkalemia 10/08/2017   . AKI (acute kidney injury) (CMS [place]) 10/08/2017   . Slow transit constipation 10/08/2017   . C. difficile diarrhea 12/02/2014   . Essential hypertension 11/30/2014   . Obesity 11/30/2014   . Cellulitis of foot, left 11/29/2014   . Type 2 diabetes mellitus (CMS [place]) 10/25/2014   . Diabetic foot infection (CMS [place]) 10/24/2014      Patient Active Problem List   Diagnosis Code   . Diabetic foot infection (CMS HCC) E11.628, L08.9   . Type 2 diabetes mellitus (CMS HCC) E11.9   . Cellulitis of foot, left L03.116   . Essential hypertension I10   . Obesity E66.9   . C. difficile diarrhea A04.72   . Hyperkalemia E87.5   . AKI (acute kidney injury) (CMS HCC) N17.9   . Slow transit constipation K59.01   . 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 Morbid obesity E66.01   . Persistent vomiting R11.15       Allergies   Allergen Reactions   . Vicodin [Hydrocodone-Acetaminophen] Itching            DISCHARGE MEDICATIONS:     Current Discharge Medication List      START taking these medications.      Details   albuterol sulfate 90 mcg/actuation HFA Aerosol Inhaler  Commonly known as: PROVENTIL or VENTOLIN or  PROAIR   2 Puffs, Inhalation, EVERY 4 HOURS PRN  Qty: 2 Inhaler  Refills: 0     ascorbic acid (vitamin C) 500 mg Tablet  Commonly known as: VITAMIN C   500 mg, Oral, 3 TIMES DAILY  Qty: 90 Tab  Refills: 0     cefuroxime 500 mg Tablet  Commonly known as: CEFTIN   500 mg, Oral, 2 TIMES DAILY  Qty: 8 Tab  Refills: 0     cholecalciferol (vitamin D3) 25 mcg (1,000 unit) Tablet  Start taking on: August 23, 2019   2,000 Units, Oral, DAILY  Qty: 60 Tab  Refills: 0     Dexamethasone 6 mg Tablet  Commonly known as: DECADRON  Start taking on: August 23, 2019   6 mg, Oral, DAILY  Qty: 7 Tab  Refills: 0     pantoprazole 40 mg Tablet, Delayed Release (E.C.)  Commonly known as: PROTONIX  Start taking on: August 23, 2019   40 mg, Oral, DAILY  Qty: 30 Tab  Refills: 0     tiotropium bromide 18 mcg Capsule, w/Inhalation Device  Commonly known as:  SPIRIVA HANDIHALER  Start taking on: August 23, 2019   18 mcg, Inhalation, DAILY  Qty: 14 Cap  Refills: 0     zinc gluconate 50 mg Tablet  Start taking on: August 23, 2019   50 mg, Oral, DAILY  Qty: 30 Tab  Refills: 0        CONTINUE these medications which have CHANGED during your visit.      Details   losartan 25 mg Tablet  Commonly known as: COZAAR  What changed: how much to take   50 mg, Oral, DAILY  Refills: 0        CONTINUE these medications - NO CHANGES were made during your visit.      Details   amLODIPine 2.5 mg Tablet  Commonly known as: NORVASC   5 mg, Oral, DAILY  Refills: 0     atorvastatin 20 mg Tablet  Commonly known as: LIPITOR   20 mg, Oral, EVERY EVENING  Refills: 0     Claritin 10 mg Tablet  Generic drug: loratadine   10 mg, Oral, DAILY PRN  Refills: 0     escitalopram oxalate 10 mg Tablet  Commonly known as: LEXAPRO   10 mg, Oral, NIGHTLY  Refills: 0     Fish Oil-Omega-3 Fatty Acids 360-1,200 mg Capsule   2 Caps, Oral, 2 TIMES DAILY  Refills: 0     fluticasone propionate 50 mcg/actuation Spray, Suspension  Commonly known as: FLONASE   1 Spray, Each Nostril,  DAILY  Refills: 0     glipiZIDE 5 mg Tablet  Commonly known as: GLUCOTROL   2.5 mg, Oral, 2 TIMES DAILY BEFORE MEALS, Take 30 minutes before meals  Refills: 0     loperamide 2 mg Capsule  Commonly known as: IMODIUM   2 mg, Oral, EVERY 4 HOURS PRN  Refills: 0     magnesium oxide 400 mg Tablet  Commonly known as: MAG-OX   250 mg, Oral, DAILY  Refills: 0        STOP taking these medications.    furosemide 20 mg Tablet  Commonly known as: LASIX     ondansetron 4 mg Tablet, Rapid Dissolve  Commonly known as: ZOFRAN ODT            DISCHARGE INSTRUCTIONS:      DISCHARGE INSTRUCTION - CARDIAC DIET     Diet: CARDIAC DIET      RETURN TO WORK/SCHOOL    Take off of work/quarantine for 3 weeks from positive test     ASPIRIN NOT ORDERED AT THIS TIME     Follow-up Information     Ocie Doyne, NP. Go on 08/29/2019.    Specialty: EXTERNAL  Why: 11:15 AM  Contact information:  [place] [address]  [place] COURSE:  This is a 53 y.o., female     1. Nausea and vomiting      This has improved       patient tolerated advanced diet      CT abdomen pelvis unremarkable    2. COVID-19 pneumonia       patient passed oxygen challenge       Continue with vitamin C vitamin D zinc      Continue with dexamethasone, antibiotic    3. CKD stage 3      Improved with IV hydration  4. Diabetes mellitus type 2      BG 230-260s       in the setting of patient getting status    5. Hypertension      Blood pressure is high, is also because patient has been getting IV fluid      Continue with home medications of amlodipine and losartan    6. Hyperlipidemia     Continue with atorvastatin    7. History of Hemorrhoidal bleed       no further bleed    Ambulated independently  Total discharge 38 mins   SIGNIFICANT PHYSICAL FINDINGS:  General: appears in good health. No distress.   Eyes: Pupils equal and round, reactive to light and accomodation.   HENT:Head atraumatic and  normocephalic   Neck: No JVD or thyromegaly or lymphadenopathy   Lungs: CTAB, non labored breathing, no rales or wheezing.    Cardiovascular: regular rate and rhythm, S1, S2 normal, no murmur,   Abdomen: Soft, non-tender, Bowel sounds normal, No hepatosplenomegaly   Extremities: extremities normal, atraumatic, no cyanosis or edema   Skin: Skin warm and dry   Neurologic: Grossly normal   Psychiatric: Normal affect, behavior,   SIGNIFICANT LAB:   SIGNIFICANT RADIOLOGY:   CONSULTATIONS:  PROCEDURES PERFORMED:     Isolation Status:  Enhanced Droplet & Contact II (C.Diff/Enteric)    COURSE IN HOSPITAL:   This is a 53 y.o., female     1. Nausea and vomiting      This has improved       patient tolerated advanced diet      CT abdomen pelvis unremarkable    2. COVID-19 pneumonia       patient passed oxygen challenge       Continue with vitamin C vitamin D zinc      Continue with dexamethasone, antibiotic    3. CKD stage 3      Improved with IV hydration    4. Diabetes mellitus type 2      BG 230-260s       in the setting of patient getting status    5. Hypertension      Blood pressure is high, is also because patient has been getting IV fluid      Continue with home medications of amlodipine and losartan    6. Hyperlipidemia     Continue with atorvastatin    7. History of Hemorrhoidal bleed       no further bleed    Ambulated independently  Total discharge 38 mins     DOES PATIENT HAVE ADVANCED DIRECTIVES:  No, Information Offered and Refused    ADVANCED CARE PLANNING - Not applicable for this patient    CONDITION ON DISCHARGE: Alert, Oriented and VS Stable    DISCHARGE DISPOSITION:  Home discharge     Copies sent to Care Team       Relationship Specialty Notifications Start End    Ocie Doyne, NP PCP - General EXTERNAL  08/15/19     Phone: [phone] Fax: [phone]         [place] [address]

## 2019-08-22 NOTE — Pharmacy (Signed)
Upon discharge, patient would like to use the discharge pharmacy service. I have updated the patient's preferred pharmacy to [facility] while she is here. Please send any discharge scripts to us, either electronically or if printed call the pharmacy. Thanks!

## 2019-08-22 NOTE — Care Management Notes (Signed)
08/21/19 1406  IP CONSULT TO CARE MANAGEMENT ([facility] - DO NOT USE FOR BEH HEALTH PATIENTS) ONE TIME CompleteDiscontinue   Process Instructions: If requesting assistance with completion of Advance Directives, please provide patient with the packet.   References: ON CALL ([place])   Provider: (Not yet assigned)   Question: Indication For Consult: Answer: D/C NEEDS - DME Comment: oxygen        To complete this referral O2 challenge ordered.  Await the O2 challenge documentation to complete.

## 2019-08-22 NOTE — Pharmacy (Signed)
[place] IV to PO Changes: Pantoprazole   [name], May, 53 y.o. female  Date of Birth:  [birth year]  Date of Admission:  08/20/2019  MRN# [id number]    Per the [facility] IV to PO Policy, the following changes have been made:    Original Order: Pantoprazole 40 mg , IV, Every 24 hours    New Order: Pantoprazole 40 mg , Oral, Daily    Please contact pharmacy with any questions.    Bonney Aid, PHARMD   08/22/2019, 10:39

## 2019-08-22 NOTE — Nurses Notes (Signed)
08/22/19 0710   [facility] Fall Risk Assessment Tool   Fall Risk Assessment Shift Assessment   Fall Risk- Implement fall risk interventions per protocol 0   Age 53   Fall History 0   Elimination, Bowel and Urine 0   Medications (Fall risk drugs: PCA/Opiates, Anti-Convulsants, Anti-Hypertensives, Diuretics, Hypnotics, Laxatives, Sedatives and Psychotropics) 3   Patient Care Equipment 1   Mobility (Yes on any of these rows will result in High Fall Risk)   Requires assistance or supervision for mobility, transfer, or ambulation 0   Unsteady Gait 0   Visual or auditory impairment affecting mobility 0   Cognition (Yes on any of these rows will result in High Fall Risk)   Altered awareness of immediate physical environment 0   Impulsive 0   Lack of understanding of one's physical and cognitive limitations 0   [facility] Score and Interventions   [facility] Score Total 4   [facility] Identified Fall Risk Auto Low Risk (1-5 total points)   Fall Risk Interventions (All That Apply) (L,M,H) Universal Falls Precautions   SBAR   Patient ID/Code Status Verification (Scan Armband): [id number]   SBAR given: Yes   Handoff report given to: [place]   Dashboard Updated Yes   Dashboard Reviewed Yes

## 2019-08-25 LAB — ADULT ROUTINE BLOOD CULTURE, SET OF 2 BOTTLES (BACTERIA AND YEAST)
BLOOD CULTURE, ROUTINE: NO GROWTH
BLOOD CULTURE, ROUTINE: NO GROWTH

## 2019-08-29 ENCOUNTER — Encounter (RURAL_HEALTH_CENTER): Payer: Self-pay | Admitting: Gerontology

## 2019-08-29 ENCOUNTER — Ambulatory Visit: Payer: 59 | Attending: Gerontology | Admitting: Gerontology

## 2019-08-29 VITALS — Temp 96.8°F | Ht 67.0 in | Wt 280.0 lb

## 2019-08-29 DIAGNOSIS — Z09 Encounter for follow-up examination after completed treatment for conditions other than malignant neoplasm: Secondary | ICD-10-CM

## 2019-08-29 DIAGNOSIS — U071 COVID-19: Secondary | ICD-10-CM

## 2019-08-29 NOTE — Progress Notes (Addendum)
Subjective:    Patient ID: Kelli Carlson is a 53 y.o. female.  Chief Complaint   Patient presents with   . Hospital Follow-up     patient was in the hospital 08/20/19   . Covid-19 Screening     postive , needs to know when she can go back to work    . Urinary Tract Infection Symptoms   . Hemorrhoids     This visit was conducted with the use of interactive audio/visual telecommunication that permitted real time communication between [place] and Ocie Doyne, NP . she consented to participation and received services at home, while I was located at [facility]    PHONE    HPI: this is a new patient, hospital f/u, recently in patient for COVID 19 infection, she is still under quarantine.  She remains fatigued, no fever.  She was treated for UTI and that has largely improved.  We will send for records for a more fulsome visit when she is improved    The following portions of the patient's history were reviewed and updated as appropriate: allergies, current medications, past family history, past medical history, past social history, past surgical history and problem list.    Social History     Socioeconomic History   . Marital status: Married     Spouse name: Not on file   . Number of children: Not on file   . Years of education: Not on file   . Highest education level: Not on file   Occupational History   . Not on file   Social Needs   . Financial resource strain: Not on file   . Food insecurity     Worry: Not on file     Inability: Not on file   . Transportation needs     Medical: Not on file     Non-medical: Not on file   Tobacco Use   . Smoking status: Never Smoker   . Smokeless tobacco: Never Used   Substance and Sexual Activity   . Alcohol use: No     Alcohol/week: 0.0 standard drinks   . Drug use: No   . Sexual activity: Yes   Lifestyle   . Physical activity     Days per week: Not on file     Minutes per session: Not on file   . Stress: Not on file   Relationships   . Social Chief Strategy Officer on phone: Not on file     Gets together: Not on file     Attends religious service: Not on file     Active member of club or organization: Not on file     Attends meetings of clubs or organizations: Not on file     Relationship status: Not on file   . Intimate partner violence     Fear of current or ex partner: Not on file     Emotionally abused: Not on file     Physically abused: Not on file     Forced sexual activity: Not on file   Other Topics Concern   . Not on file   Social History Narrative   . Not on file     Review of Systems   Constitutional: Positive for fatigue.   HENT: Negative.    Respiratory: Positive for cough. Negative for shortness of breath.    Cardiovascular: Negative for leg swelling.   Musculoskeletal: Positive for arthralgias and myalgias.  Neurological: Positive for headaches.                       Objective:    Physical Exam  Constitutional:       Comments: Via phone, she is a good historian, records in [place] everywhere for recent hospital stay       Temperature (!) 96.8 F (36 C), temperature source Temporal, height 1.702 m (5\' 7" ), weight 127 kg (280 lb).        Assessment:       1. COVID-19 virus detected    2. Hospital discharge follow-up          Plan:       Will remain off work for an additional week  Orders Placed This Encounter   . pantoprazole (PROTONIX) 40 MG tablet   . DISCONTD: dexamethasone (DECADRON) 6 MG tablet   . tiotropium (SPIRIVA) 18 MCG inhalation capsule   . Ascorbic Acid (vitamin C, acerola,) 500 MG tablet   . Zinc 50 MG Cap   . Omega-3 Fatty Acids (fish oil) 1000 MG Cap capsule     Return in about 4 weeks (around 09/26/2019), or if symptoms worsen or fail to improve.  Time Spent Documentation for Audio Phone Visits:   11 - 20 minutes          Follow up sooner if no improvement or if symptoms worsen or persist.   Medication dosing instructions, drug-drug interactions and potential side effects discussed in detail.   Failure to comply with plan of care discussed  with patient. Importance of adhering to follow up office visits discussed in detail.   Potential risks of noncompliance discussed with patient.    Ocie Doyne, NP

## 2019-09-05 ENCOUNTER — Encounter (RURAL_HEALTH_CENTER): Payer: Self-pay | Admitting: Gerontology

## 2019-09-08 ENCOUNTER — Encounter (RURAL_HEALTH_CENTER): Payer: Self-pay

## 2019-09-15 ENCOUNTER — Encounter (RURAL_HEALTH_CENTER): Payer: Self-pay | Admitting: Gerontology

## 2019-09-16 ENCOUNTER — Other Ambulatory Visit (RURAL_HEALTH_CENTER): Payer: Self-pay | Admitting: Gerontology

## 2019-09-16 MED ORDER — GLIPIZIDE 5 MG PO TABS
5.0000 mg | ORAL_TABLET | Freq: Two times a day (BID) | ORAL | 3 refills | Status: DC
Start: 2019-09-16 — End: 2020-03-02

## 2019-09-30 ENCOUNTER — Other Ambulatory Visit (RURAL_HEALTH_CENTER): Payer: Self-pay | Admitting: Gerontology

## 2019-09-30 NOTE — Telephone Encounter (Signed)
Patient calling requesting refill.    Medication(s): Lexapro    Pharmacy: [facility]

## 2019-10-01 MED ORDER — ESCITALOPRAM OXALATE 10 MG PO TABS
10.0000 mg | ORAL_TABLET | Freq: Every day | ORAL | 2 refills | Status: DC
Start: 2019-10-01 — End: 2020-06-25

## 2019-11-07 ENCOUNTER — Other Ambulatory Visit (RURAL_HEALTH_CENTER): Payer: Self-pay | Admitting: Gerontology

## 2019-11-07 DIAGNOSIS — E119 Type 2 diabetes mellitus without complications: Secondary | ICD-10-CM

## 2019-11-07 DIAGNOSIS — I10 Essential (primary) hypertension: Secondary | ICD-10-CM

## 2019-11-07 MED ORDER — AMLODIPINE BESYLATE 10 MG PO TABS
5.0000 mg | ORAL_TABLET | Freq: Every day | ORAL | 3 refills | Status: DC
Start: 2019-11-07 — End: 2020-03-02

## 2019-11-07 MED ORDER — LOSARTAN POTASSIUM 100 MG PO TABS
100.0000 mg | ORAL_TABLET | Freq: Every day | ORAL | 3 refills | Status: DC
Start: 2019-11-07 — End: 2020-03-02

## 2019-11-28 ENCOUNTER — Telehealth (RURAL_HEALTH_CENTER): Payer: Self-pay

## 2019-11-28 NOTE — Telephone Encounter (Signed)
Called and left message re: number to call for receiving the COVID19 vaccine which is being offered locally on 11/29/2018.

## 2019-11-29 ENCOUNTER — Ambulatory Visit
Admission: RE | Admit: 2019-11-29 | Discharge: 2019-11-29 | Disposition: A | Discharge status: Home / Self Care | Payer: 59 | Source: Ambulatory Visit | Attending: Gerontology | Admitting: Gerontology

## 2019-11-29 DIAGNOSIS — E119 Type 2 diabetes mellitus without complications: Secondary | ICD-10-CM | POA: Insufficient documentation

## 2019-11-29 DIAGNOSIS — I10 Essential (primary) hypertension: Secondary | ICD-10-CM | POA: Insufficient documentation

## 2019-11-29 LAB — COMPREHENSIVE METABOLIC PANEL
ALT: 26 U/L (ref 0–55)
AST (SGOT): 20 U/L (ref 10–42)
Albumin/Globulin Ratio: 1.12 Ratio (ref 0.80–2.00)
Albumin: 3.7 gm/dL (ref 3.5–5.0)
Alkaline Phosphatase: 73 U/L (ref 40–145)
Anion Gap: 14.4 mMol/L (ref 7.0–18.0)
BUN / Creatinine Ratio: 27.1 Ratio (ref 10.0–30.0)
BUN: 45 mg/dL — ABNORMAL HIGH (ref 7–22)
Bilirubin, Total: 0.4 mg/dL (ref 0.1–1.2)
CO2: 24.7 mMol/L (ref 20.0–30.0)
Calcium: 9.6 mg/dL (ref 8.5–10.5)
Chloride: 107 mMol/L (ref 98–110)
Creatinine: 1.66 mg/dL — ABNORMAL HIGH (ref 0.60–1.20)
EGFR: 35 mL/min/{1.73_m2} — ABNORMAL LOW (ref 60–150)
Globulin: 3.3 gm/dL (ref 2.0–4.0)
Glucose: 154 mg/dL — ABNORMAL HIGH (ref 71–99)
Osmolality Calculated: 296 mOsm/kg (ref 275–300)
Potassium: 5.1 mMol/L (ref 3.5–5.3)
Protein, Total: 7 gm/dL (ref 6.0–8.3)
Sodium: 141 mMol/L (ref 136–147)

## 2019-11-29 LAB — CBC AND DIFFERENTIAL
Basophils %: 0.5 % (ref 0.0–3.0)
Basophils Absolute: 0 10*3/uL (ref 0.0–0.3)
Eosinophils %: 1.4 % (ref 0.0–7.0)
Eosinophils Absolute: 0.1 10*3/uL (ref 0.0–0.8)
Hematocrit: 38.8 % (ref 36.0–48.0)
Hemoglobin: 13.1 gm/dL (ref 12.0–16.0)
Lymphocytes Absolute: 1.9 10*3/uL (ref 0.6–5.1)
Lymphocytes: 32.9 % (ref 15.0–46.0)
MCH: 31 pg (ref 28–35)
MCHC: 34 gm/dL (ref 31–36)
MCV: 90 fL (ref 80–100)
MPV: 8.9 fL (ref 6.0–10.0)
Monocytes Absolute: 0.4 10*3/uL (ref 0.1–1.7)
Monocytes: 6.1 % (ref 3.0–15.0)
Neutrophils %: 59.1 % (ref 42.0–78.0)
Neutrophils Absolute: 3.5 10*3/uL (ref 1.7–8.6)
PLT CT: 215 10*3/uL (ref 130–440)
RBC: 4.3 10*6/uL (ref 3.80–5.00)
RDW: 14 % (ref 10.5–14.5)
WBC: 5.8 10*3/uL (ref 4.0–11.0)

## 2019-11-29 LAB — LIPID PANEL
Cholesterol: 187 mg/dL (ref 75–199)
Coronary Heart Disease Risk: 5.84
HDL: 32 mg/dL — ABNORMAL LOW (ref 45–65)
LDL Calculated: 83 mg/dL (ref 10–130)
Triglycerides: 362 mg/dL — ABNORMAL HIGH (ref 10–150)
VLDL: 72 — ABNORMAL HIGH (ref 0–40)

## 2019-11-29 LAB — MICROALBUMIN, RANDOM URINE: Microalbumin-Random, U: 519.27 ug/mL — ABNORMAL HIGH (ref 0.00–20.00)

## 2019-11-29 LAB — HEMOGLOBIN A1C: Hgb A1C, %: 8.6 %

## 2019-12-01 ENCOUNTER — Encounter (RURAL_HEALTH_CENTER): Payer: Self-pay | Admitting: Gerontology

## 2019-12-01 ENCOUNTER — Ambulatory Visit: Payer: 59 | Attending: Gerontology | Admitting: Gerontology

## 2019-12-01 VITALS — BP 162/94 | HR 76 | Temp 96.6°F | Resp 16 | Ht 67.0 in | Wt 280.0 lb

## 2019-12-01 DIAGNOSIS — E119 Type 2 diabetes mellitus without complications: Secondary | ICD-10-CM

## 2019-12-01 DIAGNOSIS — E782 Mixed hyperlipidemia: Secondary | ICD-10-CM

## 2019-12-01 DIAGNOSIS — I10 Essential (primary) hypertension: Secondary | ICD-10-CM

## 2019-12-01 DIAGNOSIS — N1832 Chronic kidney disease, stage 3b: Secondary | ICD-10-CM

## 2019-12-04 ENCOUNTER — Encounter (RURAL_HEALTH_CENTER): Payer: Self-pay | Admitting: Gerontology

## 2019-12-04 NOTE — Progress Notes (Addendum)
Subjective:     Patient ID: Kelli Carlson is a 54 y.o. female.  Chief Complaint   Patient presents with   . Results     labs       HPI: follow up to review lab results, she is disappointed her A1c may be up related to illness from COVID and steroid use, otherwise she is in her current state of health    The following portions of the patient's history were reviewed and updated as appropriate: allergies, current medications, past family history, past medical history, past social history, past surgical history and problem list.    Social History     Socioeconomic History   . Marital status: Married     Spouse name: Not on file   . Number of children: Not on file   . Years of education: Not on file   . Highest education level: Not on file   Occupational History   . Not on file   Social Needs   . Financial resource strain: Not on file   . Food insecurity     Worry: Not on file     Inability: Not on file   . Transportation needs     Medical: Not on file     Non-medical: Not on file   Tobacco Use   . Smoking status: Never Smoker   . Smokeless tobacco: Never Used   Substance and Sexual Activity   . Alcohol use: No     Alcohol/week: 0.0 standard drinks   . Drug use: No   . Sexual activity: Yes   Lifestyle   . Physical activity     Days per week: Not on file     Minutes per session: Not on file   . Stress: Not on file   Relationships   . Social Product manager on phone: Not on file     Gets together: Not on file     Attends religious service: Not on file     Active member of club or organization: Not on file     Attends meetings of clubs or organizations: Not on file     Relationship status: Not on file   . Intimate partner violence     Fear of current or ex partner: Not on file     Emotionally abused: Not on file     Physically abused: Not on file     Forced sexual activity: Not on file   Other Topics Concern   . Not on file   Social History Narrative   . Not on file     Review of Systems   Constitutional: Negative  for fatigue and fever.   HENT: Negative.    Respiratory: Positive for cough. Negative for shortness of breath and wheezing.    Cardiovascular: Negative for chest pain and leg swelling.   Gastrointestinal: Negative.    Genitourinary: Negative.    Psychiatric/Behavioral: The patient is nervous/anxious.                  Hospital Outpatient Visit on 11/29/2019   Component Date Value Ref Range Status   . Sodium 11/29/2019 141  136 - 147 mMol/L Final   . Potassium 11/29/2019 5.1  3.5 - 5.3 mMol/L Final   . Chloride 11/29/2019 107  98 - 110 mMol/L Final   . CO2 11/29/2019 24.7  20.0 - 30.0 mMol/L Final   . Calcium 11/29/2019 9.6  8.5 - 10.5 mg/dL Final   .  Glucose 11/29/2019 154* 71 - 99 mg/dL Final   . Creatinine 11/29/2019 1.66* 0.60 - 1.20 mg/dL Final   . BUN 11/29/2019 45* 7 - 22 mg/dL Final   . Protein, Total 11/29/2019 7.0  6.0 - 8.3 gm/dL Final   . Albumin 11/29/2019 3.7  3.5 - 5.0 gm/dL Final   . Alkaline Phosphatase 11/29/2019 73  40 - 145 U/L Final   . ALT 11/29/2019 26  0 - 55 U/L Final   . AST (SGOT) 11/29/2019 20  10 - 42 U/L Final   . Bilirubin, Total 11/29/2019 0.4  0.1 - 1.2 mg/dL Final   . Albumin/Globulin Ratio 11/29/2019 1.12  0.80 - 2.00 Ratio Final   . Anion Gap 11/29/2019 14.4  7.0 - 18.0 mMol/L Final   . BUN / Creatinine Ratio 11/29/2019 27.1  10.0 - 30.0 Ratio Final   . EGFR 11/29/2019 35* 60 - 150 mL/min/1.27m2 Final   . Osmolality Calculated 11/29/2019 296  275 - 300 mOsm/kg Final   . Globulin 11/29/2019 3.3  2.0 - 4.0 gm/dL Final   . Cholesterol 11/29/2019 187  75 - 199 mg/dL Final   . Triglycerides 11/29/2019 362* 10 - 150 mg/dL Final   . HDL 11/29/2019 32* 45 - 65 mg/dL Final   . LDL Calculated 11/29/2019 83  10 - 130 mg/dL Final   . Coronary Heart Disease Risk 11/29/2019 5.84   Final   . VLDL 11/29/2019 72* 0 - 40 Final   . Hgb A1C, % 11/29/2019 8.6  % Final   . Microalbumin-Random, U 11/29/2019 519.27* 0.00 - 20.00 mcg/mL Final   . WBC 11/29/2019 5.8  4.0 - 11.0 K/cmm Final   . RBC 11/29/2019  4.30  3.80 - 5.00 M/cmm Final   . Hemoglobin 11/29/2019 13.1  12.0 - 16.0 gm/dL Final   . Hematocrit 11/29/2019 38.8  36.0 - 48.0 % Final   . MCV 11/29/2019 90  80 - 100 fL Final   . MCH 11/29/2019 31  28 - 35 pg Final   . MCHC 11/29/2019 34  31 - 36 gm/dL Final   . RDW 11/29/2019 14.0  10.5 - 14.5 % Final   . PLT CT 11/29/2019 215  130 - 440 K/cmm Final   . MPV 11/29/2019 8.9  6.0 - 10.0 fL Final   . Neutrophils % 11/29/2019 59.1  42.0 - 78.0 % Final   . Lymphocytes 11/29/2019 32.9  15.0 - 46.0 % Final   . Monocytes 11/29/2019 6.1  3.0 - 15.0 % Final   . Eosinophils % 11/29/2019 1.4  0.0 - 7.0 % Final   . Basophils % 11/29/2019 0.5  0.0 - 3.0 % Final   . Neutrophils Absolute 11/29/2019 3.5  1.7 - 8.6 K/cmm Final   . Lymphocytes Absolute 11/29/2019 1.9  0.6 - 5.1 K/cmm Final   . Monocytes Absolute 11/29/2019 0.4  0.1 - 1.7 K/cmm Final   . Eosinophils Absolute 11/29/2019 0.1  0.0 - 0.8 K/cmm Final   . Basophils Absolute 11/29/2019 0.0  0.0 - 0.3 K/cmm Final           Objective:    Physical Exam  Vitals signs and nursing note reviewed.   Constitutional:       Appearance: Normal appearance.   HENT:      Head: Normocephalic.   Cardiovascular:      Rate and Rhythm: Normal rate.   Pulmonary:      Effort: Pulmonary effort is normal.   Skin:  General: Skin is dry.   Neurological:      Mental Status: She is alert. Mental status is at baseline.       Blood pressure (!) 162/94, pulse 76, temperature (!) 96.6 F (35.9 C), temperature source Temporal, resp. rate 16, height 1.702 m (5\' 7" ), weight 127 kg (280 lb).      Body mass index is 43.85 kg/m.    Medical Decision Making:   1. Essential hypertension  Anxious today did trend down on second reading, no headaches, or chest pain, no SOB, she is tearful today  2. Type 2 diabetes mellitus without complication, without long-term current use of insulin  Her A1c is slightly up at 8.6 previous reading in Jan, (pre COVID) was 6.7  3. Stage 3b chronic kidney disease  She follows  with renal and is stable with an EGFR at 35  4. Mixed hyperlipidemia  Remains on Atorvastatin with an LDL of 83  5. Morbid obesity  Body mass index is 43.85 kg/m.  Nutrition and Physical Activity Counseling:  Nutrition Counseling:   Food education, guidance and counseling  Physical Activity Counseling   Patient advised about exercise    No changes to current POC    Return in about 3 months (around 03/02/2020) for Fasting visit with fingerstick.      Follow up sooner if no improvement or if symptoms worsen or persist.   Medication dosing instructions, drug-drug interactions and potential side effects discussed in detail.   Failure to comply with plan of care discussed with patient. Importance of adhering to follow up office visits discussed in detail.   Potential risks of noncompliance discussed with patient.    Ocie Doyne, NP       I have reviewed encounter and agree with plan of care.  Curt Jews, MD

## 2019-12-19 ENCOUNTER — Telehealth (INDEPENDENT_AMBULATORY_CARE_PROVIDER_SITE_OTHER): Payer: Self-pay | Admitting: Urology

## 2019-12-19 DIAGNOSIS — C642 Malignant neoplasm of left kidney, except renal pelvis: Secondary | ICD-10-CM

## 2019-12-19 NOTE — Telephone Encounter (Signed)
-----   Message from Ruta Hinds, MD sent at 12/17/2019  4:53 PM EDT -----  Regarding: RE: Abdomen MRI  With & without contrast  ----- Message -----  From: Galen Manila, LPN  Sent: 7/57/9728   3:07 PM EDT  To: Ruta Hinds, MD  Subject: FW: Abdomen MRI                                  Are you wanting MRI of abdomen wo contrast?.Elray Dains, LPN  10/31/154, 15:37  ----- Message -----  From: Jearld Lesch, Office Staff  Sent: 12/17/2019   1:33 PM EDT  To: Galen Manila, LPN  Subject: Abdomen MRI                                      Per Dr Charlett Lango disposition note, she is to return in 1 yr with and abdomen MRI but there is no order in her chart. Can you order for me please?

## 2020-01-01 ENCOUNTER — Encounter (INDEPENDENT_AMBULATORY_CARE_PROVIDER_SITE_OTHER): Payer: Self-pay

## 2020-01-01 ENCOUNTER — Telehealth (INDEPENDENT_AMBULATORY_CARE_PROVIDER_SITE_OTHER): Payer: Self-pay | Admitting: Urology

## 2020-01-01 NOTE — Telephone Encounter (Addendum)
Message routed to Dr Kerin Perna for review:  Patient l/m in v/m.  You ordered MRI w wo contrast.  Patient is concerned about having the contrast due to her kidney function. She is questioning if the order can be changed to wo contrast.Amandeep Hogston, LPN  0/11/4740, 59:56    Ruta Hinds, MD  Galen Manila, LPN  Caller: Unspecified Wilburn Mylar, 2:08 PM)  You can reassure her that MRI contrast (gadolinium) does not affect kidneys the same way as CT contrast (iodine) so it is safe for her to get the MRI with contrast.                Patient notified.Galen Manila, LPN  12/01/7562, 33:29

## 2020-01-26 ENCOUNTER — Encounter (INDEPENDENT_AMBULATORY_CARE_PROVIDER_SITE_OTHER): Payer: Self-pay | Admitting: Urology

## 2020-01-29 ENCOUNTER — Telehealth (INDEPENDENT_AMBULATORY_CARE_PROVIDER_SITE_OTHER): Payer: Self-pay | Admitting: Urology

## 2020-01-29 DIAGNOSIS — Z01812 Encounter for preprocedural laboratory examination: Secondary | ICD-10-CM

## 2020-01-29 NOTE — Telephone Encounter (Signed)
[facility] Clinical Support Staff  Radiology called and stated that they need a bun and creatine order placed for the patient       Order placed.Galen Manila, LPN  05/03/8279, 03:49

## 2020-02-03 ENCOUNTER — Ambulatory Visit (HOSPITAL_COMMUNITY): Payer: 59

## 2020-02-04 ENCOUNTER — Ambulatory Visit
Admission: RE | Admit: 2020-02-04 | Discharge: 2020-02-04 | Disposition: A | Discharge status: Home / Self Care | Payer: 59 | Source: Ambulatory Visit | Attending: Urology | Admitting: Urology

## 2020-02-04 ENCOUNTER — Other Ambulatory Visit: Payer: Self-pay

## 2020-02-04 DIAGNOSIS — C642 Malignant neoplasm of left kidney, except renal pelvis: Secondary | ICD-10-CM | POA: Insufficient documentation

## 2020-02-04 MED ORDER — GADOTERIDOL 279.3 MG/ML INTRAVENOUS SOLUTION
20.00 mL | INTRAVENOUS | Status: AC
Start: 2020-02-04 — End: 2020-02-04
  Administered 2020-02-04: 20 mL via INTRAVENOUS
  Filled 2020-02-04: qty 20

## 2020-02-05 ENCOUNTER — Encounter (INDEPENDENT_AMBULATORY_CARE_PROVIDER_SITE_OTHER): Payer: Self-pay | Admitting: Urology

## 2020-02-05 ENCOUNTER — Ambulatory Visit (INDEPENDENT_AMBULATORY_CARE_PROVIDER_SITE_OTHER): Payer: 59 | Admitting: Urology

## 2020-02-05 VITALS — BP 158/84 | HR 86 | Temp 97.8°F | Ht 67.0 in | Wt 293.6 lb

## 2020-02-05 DIAGNOSIS — C642 Malignant neoplasm of left kidney, except renal pelvis: Secondary | ICD-10-CM

## 2020-02-05 DIAGNOSIS — Z85528 Personal history of other malignant neoplasm of kidney: Secondary | ICD-10-CM

## 2020-02-05 DIAGNOSIS — N281 Cyst of kidney, acquired: Secondary | ICD-10-CM

## 2020-02-05 DIAGNOSIS — R93422 Abnormal radiologic findings on diagnostic imaging of left kidney: Secondary | ICD-10-CM

## 2020-02-05 NOTE — Assessment & Plan Note (Signed)
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  This is in the location of a hematoma that was present soon after surgery.  I discussed options to include continued observation, extirpative therapy, or biopsy.  The patient is nervous about the lesion and would like something done.  She is willing to undergo surgical excision, but I convinced her that we should only do that if it is a biopsy proven cancer.  Plan for a biopsy of the lesion.

## 2020-02-05 NOTE — Progress Notes (Signed)
[facility]  [address] [place], [place] [address]    Return Office Visit    SUBJECTIVE:    Patient ID:   [name], 54 y.o., female     Chief Complaint:   Chief Complaint   Patient presents with    Follow-up     1 year    MRI Results       History of Present Illness:    54 y.o. female presents for evaluation of MRI results from yesterday, 02/04/2020. She had a left hand-assisted laparoscopic partial nephrectomy for a 3.5 cm left renal mass on 10/24/2017. 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    An MRI yesterday showed an enhancing lesion adjacent to the kidney. The lesion is near the site of a hematoma that was present soon after surgery and is stable when compared to previous imaging. Her right kidney shows normal morphology. No hydronephrosis. Left kidney, upper pole shows 1.5 cm simple cysts. The lesion was present before the surgery and is unlikely cancerous; however, a biopsy will be perfomed for certainty.        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    Depression     Diabetes mellitus (CMS HCC)     Essential hypertension     Fatty liver     Headache(784.0)     Hyperlipidemia     Irritable bowel syndrome     Macular edema     Neck problem     good ROM    Obesity     Type 2 diabetes mellitus (CMS HCC)     White coat hypertension            Past Surgical History:  Past Surgical History:   Procedure Laterality Date    ECTOPIC PREGNANCY SURGERY  1999    HX CATARACT REMOVAL Bilateral     HX CHOLECYSTECTOMY  09/10/96    HX OTHER  1996    Hymenectomy    HX PELVIC LAPAROSCOPY  05/24/98    Ectopic pregnancy    HX TUBAL LIGATION  09/10/2012    Filshie Clips    HYMENECTOMY             Review of Systems:  Constitutional: No fever, chills, or weight  loss  GU: As per HPI    Allergies:  Allergies   Allergen Reactions    Vicodin [Hydrocodone-Acetaminophen] Itching       Home Medications:  Prior to Admission medications    Medication Sig Start Date End Date Taking? Authorizing Provider   albuterol sulfate (PROVENTIL OR VENTOLIN OR PROAIR) 90 mcg/actuation Inhalation HFA Aerosol Inhaler Take 2 Puffs by inhalation Every 4 hours as needed 08/22/19   Justice Rocher, MD   amLODIPine (NORVASC) 2.5 mg Oral Tablet Take 5 mg by mouth Once a day    Yes Provider, Historical   ascorbic acid, vitamin C, (VITAMIN C) 500 mg Oral Tablet Take 500 mg by mouth   Yes Provider, Historical   atorvastatin (LIPITOR) 20 mg Oral Tablet Take 20 mg by mouth Every evening   Yes Provider, Historical   cholecalciferol, Vitamin D3, (VI-D-SOL) 10 mcg/mL (400 unit/mL) Oral Drops Take 2,000 Units by mouth  Yes Provider, Historical   empagliflozin (JARDIANCE) 10 mg Oral Tablet Take 10 mg by mouth 01/20/20 01/19/21 Yes Provider, Historical   escitalopram oxalate (LEXAPRO) 10 mg Oral Tablet Take 10 mg by mouth Every night   Yes Provider, Historical   Fish Oil-Omega-3 Fatty Acids 360-1,200 mg Oral Capsule Take 2 Caps by mouth Twice daily   Yes Provider, Historical   fluticasone (FLONASE) 50 mcg/actuation Nasal Spray, Suspension 1 Spray by Each Nostril route Once a day    Provider, Historical   furosemide (LASIX) 40 mg Oral Tablet  01/14/20  Yes Provider, Historical   glipiZIDE (GLUCOTROL) 5 mg Oral Tablet Take 2.5 mg by mouth Twice a day before meals Take 30 minutes before meals   Yes Provider, Historical   loperamide (IMODIUM) 2 mg Oral Capsule Take 2 mg by mouth Every 4 hours as needed    Provider, Historical   loratadine (CLARITIN) 10 mg Oral Tablet Take 10 mg by mouth Once per day as needed     Provider, Historical   losartan (COZAAR) 25 mg Oral Tablet Take 2 Tabs (50 mg total) by mouth Once a day 08/22/19  Yes Justice Rocher, MD   magnesium oxide (MAG-OX) 400 mg (241.3 mg magnesium) Oral Tablet  Take 250 mg by mouth Once a day    Yes Provider, Historical   metFORMIN (GLUCOPHAGE) 500 mg Oral Tablet Take 500 mg by mouth 01/20/20 01/19/21 Yes Provider, Historical   tiotropium bromide (SPIRIVA HANDIHALER) 18 mcg Inhalation Capsule, w/Inhalation Device Take 1 Cap (18 mcg total) by inhalation Once a day for 14 days 08/23/19 09/06/19  Justice Rocher, MD       Social History:  Social History     Tobacco Use    Smoking status: Never Smoker    Smokeless tobacco: Never Used   Substance Use Topics    Alcohol use: No    Drug use: No       Family History:  Family Medical History:     Problem Relation (Age of Onset)    Cancer Maternal Grandmother    Diabetes Father    Hypertension (High Blood Pressure) Maternal Uncle, Father              OBJECTIVE:    Vitals:  BP (!) 158/84    Pulse 86    Temp 36.6 C (97.8 F) (Thermal Scan)    Ht 1.702 m (5\' 7" )    Wt 133 kg (293 lb 9.6 oz)    BMI 45.98 kg/m         Physical examination:  Constitutional: Appears well, no acute distress.    Respiratory: Non-labored breathing  GI: soft, not distended, no masses.   GU: No CVA tenderness, Bladder Non-tender  Musculoskeletal: Moves all extremities   Psychiatric: Mood and affect normal.     Labs:  Pertinent labs reviewed.    Imaging studies:  Pertinent imaging studies reviewed.    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  This is in the location of a hematoma that was present soon after surgery.  I discussed options to include continued observation, extirpative therapy, or biopsy.  The patient is nervous about the lesion and would like something done.  She is willing to undergo surgical excision, but I convinced her that we should only do that if it  is a biopsy proven cancer.  Plan for a biopsy of the lesion.      Orders Placed This Encounter    IR IR/VASCULAR MEDICINE  CONSULT    IR CT GUIDED RENAL BIOPSY       Follow up for biopsy.    Parts of this note were completed using voice recognition technology. Please excuse any errors that have occurred as a result and contact me if any parts of the note are unclear.    I am in the presence of and scribing for Dr. Ruta Hinds, MD, for services provided on 02/05/2020.  Caryl Pina, [place]  02/05/2020, 15:18    I personally performed the services described in this documentation, as scribed  in my presence, and it is both accurate  and complete.    Ruta Hinds, MD  02/06/2020, 08:13

## 2020-03-02 ENCOUNTER — Encounter (RURAL_HEALTH_CENTER): Payer: Self-pay | Admitting: Gerontology

## 2020-03-02 ENCOUNTER — Ambulatory Visit: Payer: 59 | Attending: Gerontology | Admitting: Gerontology

## 2020-03-02 VITALS — BP 144/84 | HR 78 | Temp 96.5°F | Resp 16 | Ht 67.0 in | Wt 288.2 lb

## 2020-03-02 DIAGNOSIS — I10 Essential (primary) hypertension: Secondary | ICD-10-CM

## 2020-03-02 DIAGNOSIS — N1832 Chronic kidney disease, stage 3b: Secondary | ICD-10-CM

## 2020-03-02 DIAGNOSIS — E1121 Type 2 diabetes mellitus with diabetic nephropathy: Secondary | ICD-10-CM

## 2020-03-02 LAB — POCT HEMOGLOBIN A1C: POCT Hgb A1C: 6.9 % — AB (ref 3.9–5.9)

## 2020-03-02 LAB — POCT GLUCOSE: Whole Blood Glucose POCT: 134 mg/dL — AB (ref 70–100)

## 2020-03-02 NOTE — Progress Notes (Signed)
Subjective:     Patient ID: Kelli Carlson is a 54 y.o. female.  Chief Complaint   Patient presents with    Blood Sugar Problem     checking A1C       HPI: Follow up today, she does follow with Nephrology (Dr Judeth Horn) and Urology (Dr Kerin Perna), she has a new cyst on her left kidney where she had a previous clear cell carcinoma, she is scheduled for a biopsy.  Dr Judeth Horn has taken her off her glipizide and started her on Metformin and Jardiance for blood sugar control, he will monitor her kidney function, he also decreased her Losartan to 50 mg po qday and Amlodipine to 2.5 mg po    The following portions of the patient's history were reviewed and updated as appropriate: allergies, current medications, past family history, past medical history, past social history, past surgical history and problem list.    Social History     Socioeconomic History    Marital status: Married     Spouse name: Not on file    Number of children: Not on file    Years of education: Not on file    Highest education level: Not on file   Occupational History    Not on file   Tobacco Use    Smoking status: Never Smoker    Smokeless tobacco: Never Used   Substance and Sexual Activity    Alcohol use: No     Alcohol/week: 0.0 standard drinks    Drug use: No    Sexual activity: Yes   Other Topics Concern    Not on file   Social History Narrative    Not on file     Social Determinants of Health     Financial Resource Strain:     Difficulty of Paying Living Expenses:    Food Insecurity:     Worried About Charity fundraiser in the Last Year:     Arboriculturist in the Last Year:    Transportation Needs:     Film/video editor (Medical):     Lack of Transportation (Non-Medical):    Physical Activity:     Days of Exercise per Week:     Minutes of Exercise per Session:    Stress:     Feeling of Stress :    Social Connections:     Frequency of Communication with Friends and Family:     Frequency of Social Gatherings with Friends  and Family:     Attends Religious Services:     Active Member of Clubs or Organizations:     Attends Music therapist:     Marital Status:    Intimate Partner Violence:     Fear of Current or Ex-Partner:     Emotionally Abused:     Physically Abused:     Sexually Abused:      Review of Systems   Constitutional: Negative for fatigue and fever.   HENT: Negative.    Respiratory: Negative.    Cardiovascular: Negative.    Endocrine: Negative.    Psychiatric/Behavioral: Negative.                        Objective:    Physical Exam  Vitals and nursing note reviewed.   Cardiovascular:      Rate and Rhythm: Normal rate.   Pulmonary:      Effort: Pulmonary effort is normal.   Skin:  General: Skin is warm and dry.   Neurological:      Mental Status: She is alert.       Blood pressure 144/84, pulse 78, temperature (!) 96.5 F (35.8 C), temperature source Temporal, resp. rate 16, height 1.702 m (5\' 7" ), weight 130.7 kg (288 lb 3.2 oz).        Wt Readings from Last 4 Encounters:   03/02/20 130.7 kg (288 lb 3.2 oz)   12/01/19 127 kg (280 lb)   08/29/19 127 kg (280 lb)   06/17/19 137.2 kg (302 lb 7.5 oz)       Medical Decision Making:   1. Type 2 diabetes mellitus with diabetic nephropathy, without long-term current use of insulin  - metFORMIN (GLUCOPHAGE) 500 MG tablet; Take 1 tablet by mouth daily  - Jardiance 10 MG tablet; Take 1 tablet by mouth daily  - POCT Glucose, fingerstick  - POCT Hemoglobin A1C  Medication changes per Dr Kerin Perna  2. Stage 3b chronic kidney disease  - POCT Glucose, fingerstick  134 trended down from 154  - POCT Hemoglobin A1C 6.9 trended down from 8.9    3. Essential hypertension  - losartan (COZAAR) 50 MG tablet; Take 1 tablet by mouth daily  - amLODIPine (NORVASC) 2.5 MG tablet; Take 1 tablet by mouth daily  Adjusted by Dr Kerin Perna    Return in about 3 months (around 06/02/2020).    Reviewed all health maintenance,  will schedule as time allows, discussed importance of closing gaps  in care     Follow up sooner if no improvement or if symptoms worsen or persist.   Medication dosing instructions, drug-drug interactions and potential side effects discussed in detail.   Failure to comply with plan of care discussed with patient. Importance of adhering to follow up office visits discussed in detail.   Potential risks of noncompliance discussed with patient.    Ocie Doyne, NP

## 2020-03-08 ENCOUNTER — Other Ambulatory Visit: Payer: Self-pay

## 2020-03-08 ENCOUNTER — Inpatient Hospital Stay
Admission: RE | Admit: 2020-03-08 | Discharge: 2020-03-08 | Disposition: A | Discharge status: Home / Self Care | Payer: 59 | Source: Ambulatory Visit | Attending: Urology | Admitting: Urology

## 2020-03-08 DIAGNOSIS — Z905 Acquired absence of kidney: Secondary | ICD-10-CM

## 2020-03-08 DIAGNOSIS — C642 Malignant neoplasm of left kidney, except renal pelvis: Secondary | ICD-10-CM | POA: Insufficient documentation

## 2020-03-08 HISTORY — PX: RENAL BIOPSY, PERCUTANEOUS: SUR144

## 2020-03-08 MED ORDER — FENTANYL (PF) 50 MCG/ML INJECTION SOLUTION
Freq: Once | INTRAMUSCULAR | Status: AC | PRN
Start: 2020-03-08 — End: 2020-03-08
  Administered 2020-03-08: 100 ug via INTRAVENOUS

## 2020-03-08 MED ORDER — FENTANYL (PF) 50 MCG/ML INJECTION SOLUTION
INTRAMUSCULAR | Status: AC
Start: 2020-03-08 — End: 2020-03-08
  Filled 2020-03-08: qty 4

## 2020-03-08 MED ORDER — LIDOCAINE 1 %-EPINEPHRINE 1:100,000 INJECTION SOLUTION
INTRAMUSCULAR | Status: AC
Start: 2020-03-08 — End: 2020-03-08
  Filled 2020-03-08: qty 50

## 2020-03-08 MED ORDER — LIDOCAINE 1 %-EPINEPHRINE 1:100,000 INJECTION SOLUTION
Freq: Once | INTRAMUSCULAR | Status: AC | PRN
Start: 2020-03-08 — End: 2020-03-08
  Administered 2020-03-08: 10 mL via INTRADERMAL

## 2020-03-08 NOTE — Nurses Notes (Signed)
03/08/20 1054 Pt is resting comfortably. VSS. Dressing to right posterior back is CDI. No bleeding. No hematoma. Pt education is reinforced in regards to site monitoring, medication compliance and follow up appointments. Pt verbalized understanding and has no questions. IV is discontinued from left hand, site is WNL. Pt has no difficulty with ambulating, voiding or PO intake. Called pt's friend Faythe Dingwall for discharge home.

## 2020-03-08 NOTE — Nurses Notes (Signed)
03/08/2020 0935 Pt arrived from IR. Report received from Mickel Baas, [place]. Pt is A&Ox4 to room 3. Pt is connected to monitoring equipment. Pt denies any pain or discomfort. Dressing to the left posterior back is CDI. Vital signs are stable. Bed is in a low locked position, side rails are upx2. Call light is within reach.

## 2020-03-08 NOTE — Nurses Notes (Signed)
03/08/20 1103 Pt is disconnected from monitoring equipment. Dressed and assisted to the wheel chair. Pt is taken to the car. Transported to passenger seat and secured with seat belt.

## 2020-03-09 LAB — SURGICAL PATHOLOGY SPECIMEN

## 2020-03-15 ENCOUNTER — Ambulatory Visit (HOSPITAL_BASED_OUTPATIENT_CLINIC_OR_DEPARTMENT_OTHER): Payer: 59

## 2020-03-15 ENCOUNTER — Ambulatory Visit (INDEPENDENT_AMBULATORY_CARE_PROVIDER_SITE_OTHER): Payer: 59 | Admitting: Urology

## 2020-03-15 ENCOUNTER — Encounter (INDEPENDENT_AMBULATORY_CARE_PROVIDER_SITE_OTHER): Payer: Self-pay | Admitting: Urology

## 2020-03-15 ENCOUNTER — Ambulatory Visit
Admission: RE | Admit: 2020-03-15 | Discharge: 2020-03-15 | Disposition: A | Discharge status: Home / Self Care | Payer: 59 | Source: Ambulatory Visit | Attending: Urology | Admitting: Urology

## 2020-03-15 ENCOUNTER — Other Ambulatory Visit: Payer: Self-pay

## 2020-03-15 VITALS — BP 173/104 | HR 89 | Temp 97.7°F | Ht 67.0 in | Wt 287.0 lb

## 2020-03-15 DIAGNOSIS — R9431 Abnormal electrocardiogram [ECG] [EKG]: Secondary | ICD-10-CM

## 2020-03-15 DIAGNOSIS — Z01818 Encounter for other preprocedural examination: Secondary | ICD-10-CM

## 2020-03-15 DIAGNOSIS — C642 Malignant neoplasm of left kidney, except renal pelvis: Secondary | ICD-10-CM

## 2020-03-15 LAB — CBC W/AUTO DIFF
BASOPHIL #: 0 10*3/uL (ref 0.00–0.10)
BASOPHIL %: 0 % (ref 0–3)
EOSINOPHIL #: 0.1 10*3/uL (ref 0.00–0.50)
EOSINOPHIL %: 1 % (ref 0–5)
HCT: 37.6 % (ref 36.0–45.0)
HGB: 12.6 g/dL (ref 12.0–15.5)
LYMPHOCYTE #: 1.6 10*3/uL (ref 1.00–4.80)
LYMPHOCYTE %: 26 % (ref 15–43)
MCH: 29.5 pg (ref 27.5–33.2)
MCHC: 33.5 g/dL (ref 32.0–36.0)
MCV: 88 fL (ref 82.0–97.0)
MONOCYTE #: 0.4 10*3/uL (ref 0.20–0.90)
MONOCYTE %: 7 % (ref 5–12)
MPV: 8.9 fL (ref 7.4–10.5)
NEUTROPHIL #: 4.1 10*3/uL (ref 1.50–6.50)
NEUTROPHIL %: 66 % (ref 43–76)
PLATELETS: 238 10*3/uL (ref 150–450)
RBC: 4.27 10*6/uL (ref 4.00–5.10)
RDW: 13.9 % (ref 11.0–16.0)
WBC: 6.1 10*3/uL (ref 4.0–11.0)

## 2020-03-15 LAB — COMPREHENSIVE METABOLIC PANEL, NON-FASTING
ALBUMIN: 3.7 g/dL (ref 3.5–5.0)
ALKALINE PHOSPHATASE: 89 U/L (ref 50–130)
ALT (SGPT): 31 U/L — ABNORMAL HIGH (ref 8–22)
ANION GAP: 11 mmol/L (ref 4–13)
AST (SGOT): 20 U/L (ref 8–45)
BILIRUBIN TOTAL: 0.4 mg/dL (ref 0.3–1.3)
BUN/CREA RATIO: 40 — ABNORMAL HIGH (ref 6–22)
BUN: 81 mg/dL — ABNORMAL HIGH (ref 8–25)
CALCIUM: 10.1 mg/dL — ABNORMAL HIGH (ref 8.5–10.0)
CHLORIDE: 105 mmol/L (ref 96–111)
CO2 TOTAL: 22 mmol/L (ref 22–30)
CREATININE: 2.05 mg/dL — ABNORMAL HIGH (ref 0.60–1.05)
ESTIMATED GFR: 27 mL/min/BSA — ABNORMAL LOW (ref >=60)
GLUCOSE: 194 mg/dL — ABNORMAL HIGH (ref 65–125)
POTASSIUM: 5.2 mmol/L — ABNORMAL HIGH (ref 3.5–5.1)
PROTEIN TOTAL: 7.6 g/dL (ref 6.4–8.3)
SODIUM: 138 mmol/L (ref 136–145)

## 2020-03-15 LAB — TYPE AND SCREEN
ABO/RH(D): O POS
ANTIBODY SCREEN: NEGATIVE

## 2020-03-15 NOTE — H&P (Signed)
[facility]  [place]  [place], [place] [address]    Return Office Visit    SUBJECTIVE:    Patient ID:   [name], 54 y.o., female       Chief Complaint:   Chief Complaint   Patient presents with   . Follow-up   . Biopsy Results       History of Present Illness:    54 y.o. female with a history of left partial nephrectomy in January of 2019 presents for follow-up.  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  A biopsy obtained on the 14th of this month shows that there is in fact malignancy in this lesion.  The lesion is between two and 3 cm in size.  The latest imaging from May 12 with MRI does show mild enlargement of the lesion in the left kidney.  The contralateral kidney appears normal.  Her latest metabolic panel shows serum creatinine of 1.59.  She is followed by Nephrology, Dr. Judeth Horn.  Her latest hemoglobin was 12 on November 27th.  Patient's blood type is O positive.    The patient has lost a significant amount of weight and her A1c has dropped significantly as a result.    Past Medical History:    Past Medical History:   Diagnosis Date   . Anxiety    . Arthritis    . Cellulitis 10/24/14    left foot   . 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 Depression    . Diabetes mellitus (CMS [place])    . Essential hypertension    . Fatty liver    . Headache(784.0)    . Hyperlipidemia    . Irritable bowel syndrome    . Macular edema    . Neck problem     good ROM   . Obesity    . Type 2 diabetes mellitus (CMS HCC)    . White coat hypertension            Past Surgical History:  Past Surgical History:   Procedure Laterality Date   . ECTOPIC PREGNANCY SURGERY  1999   . HX CATARACT REMOVAL Bilateral    . HX CHOLECYSTECTOMY  09/10/96   . 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 HX PELVIC LAPAROSCOPY  05/24/98    Ectopic pregnancy   . HX TUBAL LIGATION   09/10/2012    Filshie Clips   . HYMENECTOMY             Review of Systems:  Constitutional: No fever, chills, or weight loss  GU: As per HPI    Allergies:  Allergies   Allergen Reactions   . Vicodin [Hydrocodone-Acetaminophen] Itching       Home Medications:  Prior to Admission medications    Medication Sig Start Date End Date Taking? Authorizing Provider   albuterol sulfate (PROVENTIL OR VENTOLIN OR PROAIR) 90 mcg/actuation Inhalation HFA Aerosol Inhaler Take 2 Puffs by inhalation Every 4 hours as needed 08/22/19   Justice Rocher, MD   amLODIPine (NORVASC) 2.5 mg Oral Tablet Take 5 mg by mouth Once a day    Yes Provider, Historical   ascorbic acid, vitamin C, (VITAMIN C) 500 mg Oral Tablet Take 500 mg by mouth   Yes Provider, Historical   atorvastatin (LIPITOR) 20 mg Oral Tablet  Take 20 mg by mouth Every evening   Yes Provider, Historical   cholecalciferol, Vitamin D3, (VI-D-SOL) 10 mcg/mL (400 unit/mL) Oral Drops Take 2,000 Units by mouth   Yes Provider, Historical   empagliflozin (JARDIANCE) 10 mg Oral Tablet Take 10 mg by mouth 01/20/20 01/19/21 Yes Provider, Historical   escitalopram oxalate (LEXAPRO) 10 mg Oral Tablet Take 10 mg by mouth Every night   Yes Provider, Historical   Fish Oil-Omega-3 Fatty Acids 360-1,200 mg Oral Capsule Take 2 Caps by mouth Twice daily   Yes Provider, Historical   fluticasone (FLONASE) 50 mcg/actuation Nasal Spray, Suspension 1 Spray by Each Nostril route Once a day    Provider, Historical   furosemide (LASIX) 40 mg Oral Tablet  01/14/20  Yes Provider, Historical   glipiZIDE (GLUCOTROL) 5 mg Oral Tablet Take 2.5 mg by mouth Twice a day before meals Take 30 minutes before meals  Patient not taking: Reported on 03/08/2020    Provider, Historical   loperamide (IMODIUM) 2 mg Oral Capsule Take 2 mg by mouth Every 4 hours as needed    Provider, Historical   loratadine (CLARITIN) 10 mg Oral Tablet Take 10 mg by mouth Once per day as needed     Provider, Historical   losartan (COZAAR) 25 mg  Oral Tablet Take 2 Tabs (50 mg total) by mouth Once a day 08/22/19  Yes Justice Rocher, MD   magnesium oxide (MAG-OX) 400 mg (241.3 mg magnesium) Oral Tablet Take 250 mg by mouth Once a day    Yes Provider, Historical   metFORMIN (GLUCOPHAGE) 500 mg Oral Tablet Take 500 mg by mouth 01/20/20 01/19/21 Yes Provider, Historical   tiotropium bromide (SPIRIVA HANDIHALER) 18 mcg Inhalation Capsule, w/Inhalation Device Take 1 Cap (18 mcg total) by inhalation Once a day for 14 days 08/23/19 09/06/19  Justice Rocher, MD       Social History:  Social History     Tobacco Use   . Smoking status: Never Smoker   . Smokeless tobacco: Never Used   Substance Use Topics   . Alcohol use: No   . Drug use: No       Family History:  Family Medical History:     Problem Relation (Age of Onset)    Cancer Maternal Grandmother    Diabetes Father    Hypertension (High Blood Pressure) Maternal Uncle, Father              OBJECTIVE:    Vitals:  BP (!) 173/104 Comment: patient is nervous about her visit  Pulse 89   Temp 36.5 C (97.7 F) (Thermal Scan)   Ht 1.702 m (5\' 7" )   Wt 130 kg (287 lb)   BMI 44.95 kg/m         Physical examination:  Constitutional: Appears well, no acute distress.    Respiratory: Non-labored breathing  GI: soft, not distended, no masses.   GU: No CVA tenderness, Bladder Non-tender  Musculoskeletal: Moves all extremities   Psychiatric: Mood and affect normal.     Labs:  Pertinent labs reviewed.      Imaging studies:  Pertinent imaging studies reviewed.    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  At this point, I do recommend a radical nephrectomy.  Options do include continued observation, repeat partial nephrectomy, ablated techniques, but right nephrectomy being most definitive surgery.  There is a downside of losing  renal tissue and removed already present renal insufficiency.  Patient is also risk presents due to her obesity and  diabetes both of these conditions have improved significantly over the past several months.    While the differential diagnosis does include benign tumors like oncocytoma and angiomyolipoma, this is most likely a renal cell carcinoma.  I discussed treatment options to include partial nephrectomy, radical nephrectomy, ablative techniques, and the option of a renal biopsy prior to treatment.  The tumor does [not] seem amenable to partial nephrectomy because of prior surgery.  I discussed the risks, benefits, and alternatives of laparoscopic left nephrectomy.  Risks include, but are not limited to bleeding, which could be severe, infection, and damage to the intra abdominal contents including bowel, liver, and major blood vessels.  Risks also include blood clots which could embolize to the lungs, heart attack, stroke, and death.  All questions were answered and the patient agrees to proceed.       Orders Placed This Encounter   . CBC W/AUTO DIFF   . COMPREHENSIVE METABOLIC PANEL, NON-FASTING   . ECG 12-LEAD   . TYPE AND SCREEN             This note was completed using voice recognition technology. Please excuse any errors that have occurred as a result and contact me if any parts of the note are unclear.

## 2020-03-15 NOTE — Assessment & Plan Note (Addendum)
Biopsy shows that the lesion is in fact a malignancy.  At this point, I do recommend a radical nephrectomy.  Options do include continued observation, repeat partial nephrectomy, ablated techniques, but right nephrectomy being most definitive surgery.  There is a downside of losing renal tissue and removed already present renal insufficiency.  Patient is also risk presents due to her obesity and diabetes both of these conditions have improved significantly over the past several months.    While the differential diagnosis does include benign tumors like oncocytoma and angiomyolipoma, this is most likely a renal cell carcinoma.  I discussed treatment options to include partial nephrectomy, radical nephrectomy, ablative techniques, and the option of a renal biopsy prior to treatment.  The tumor does [not] seem amenable to partial nephrectomy because of prior surgery.  I discussed the risks, benefits, and alternatives of laparoscopic left nephrectomy.  Risks include, but are not limited to bleeding, which could be severe, infection, and damage to the intra abdominal contents including bowel, liver, and major blood vessels.  Risks also include blood clots which could embolize to the lungs, heart attack, stroke, and death.  All questions were answered and the patient agrees to proceed.

## 2020-03-15 NOTE — H&P (View-Only) (Signed)
[facility]  [address]  [place], [place] [address]    Return Office Visit    SUBJECTIVE:    Patient ID:   [name], 54 y.o., female       Chief Complaint:   Chief Complaint   Patient presents with   . Follow-up   . Biopsy Results       History of Present Illness:    54 y.o. female with a history of left partial nephrectomy in January of 2019 presents for follow-up.  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  A biopsy obtained on the 14th of this month shows that there is in fact malignancy in this lesion.  The lesion is between two and 3 cm in size.  The latest imaging from May 12 with MRI does show mild enlargement of the lesion in the left kidney.  The contralateral kidney appears normal.  Her latest metabolic panel shows serum creatinine of 1.59.  She is followed by Nephrology, Dr. Judeth Horn.  Her latest hemoglobin was 12 on November 27th.  Patient's blood type is O positive.    The patient has lost a significant amount of weight and her A1c has dropped significantly as a result.    Past Medical History:    Past Medical History:   Diagnosis Date   . Anxiety    . Arthritis    . Cellulitis 10/24/14    left foot   . 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 Depression    . Diabetes mellitus (CMS [place])    . Essential hypertension    . Fatty liver    . Headache(784.0)    . Hyperlipidemia    . Irritable bowel syndrome    . Macular edema    . Neck problem     good ROM   . Obesity    . Type 2 diabetes mellitus (CMS HCC)    . White coat hypertension            Past Surgical History:  Past Surgical History:   Procedure Laterality Date   . ECTOPIC PREGNANCY SURGERY  1999   . HX CATARACT REMOVAL Bilateral    . HX CHOLECYSTECTOMY  09/10/96   . 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 HX PELVIC LAPAROSCOPY  05/24/98    Ectopic pregnancy   . HX TUBAL LIGATION   09/10/2012    Filshie Clips   . HYMENECTOMY             Review of Systems:  Constitutional: No fever, chills, or weight loss  GU: As per HPI    Allergies:  Allergies   Allergen Reactions   . Vicodin [Hydrocodone-Acetaminophen] Itching       Home Medications:  Prior to Admission medications    Medication Sig Start Date End Date Taking? Authorizing Provider   albuterol sulfate (PROVENTIL OR VENTOLIN OR PROAIR) 90 mcg/actuation Inhalation HFA Aerosol Inhaler Take 2 Puffs by inhalation Every 4 hours as needed 08/22/19   Justice Rocher, MD   amLODIPine (NORVASC) 2.5 mg Oral Tablet Take 5 mg by mouth Once a day    Yes Provider, Historical   ascorbic acid, vitamin C, (VITAMIN C) 500 mg Oral Tablet Take 500 mg by mouth   Yes Provider, Historical   atorvastatin (LIPITOR) 20 mg Oral Tablet  Take 20 mg by mouth Every evening   Yes Provider, Historical   cholecalciferol, Vitamin D3, (VI-D-SOL) 10 mcg/mL (400 unit/mL) Oral Drops Take 2,000 Units by mouth   Yes Provider, Historical   empagliflozin (JARDIANCE) 10 mg Oral Tablet Take 10 mg by mouth 01/20/20 01/19/21 Yes Provider, Historical   escitalopram oxalate (LEXAPRO) 10 mg Oral Tablet Take 10 mg by mouth Every night   Yes Provider, Historical   Fish Oil-Omega-3 Fatty Acids 360-1,200 mg Oral Capsule Take 2 Caps by mouth Twice daily   Yes Provider, Historical   fluticasone (FLONASE) 50 mcg/actuation Nasal Spray, Suspension 1 Spray by Each Nostril route Once a day    Provider, Historical   furosemide (LASIX) 40 mg Oral Tablet  01/14/20  Yes Provider, Historical   glipiZIDE (GLUCOTROL) 5 mg Oral Tablet Take 2.5 mg by mouth Twice a day before meals Take 30 minutes before meals  Patient not taking: Reported on 03/08/2020    Provider, Historical   loperamide (IMODIUM) 2 mg Oral Capsule Take 2 mg by mouth Every 4 hours as needed    Provider, Historical   loratadine (CLARITIN) 10 mg Oral Tablet Take 10 mg by mouth Once per day as needed     Provider, Historical   losartan (COZAAR) 25 mg  Oral Tablet Take 2 Tabs (50 mg total) by mouth Once a day 08/22/19  Yes Justice Rocher, MD   magnesium oxide (MAG-OX) 400 mg (241.3 mg magnesium) Oral Tablet Take 250 mg by mouth Once a day    Yes Provider, Historical   metFORMIN (GLUCOPHAGE) 500 mg Oral Tablet Take 500 mg by mouth 01/20/20 01/19/21 Yes Provider, Historical   tiotropium bromide (SPIRIVA HANDIHALER) 18 mcg Inhalation Capsule, w/Inhalation Device Take 1 Cap (18 mcg total) by inhalation Once a day for 14 days 08/23/19 09/06/19  Justice Rocher, MD       Social History:  Social History     Tobacco Use   . Smoking status: Never Smoker   . Smokeless tobacco: Never Used   Substance Use Topics   . Alcohol use: No   . Drug use: No       Family History:  Family Medical History:     Problem Relation (Age of Onset)    Cancer Maternal Grandmother    Diabetes Father    Hypertension (High Blood Pressure) Maternal Uncle, Father              OBJECTIVE:    Vitals:  BP (!) 173/104 Comment: patient is nervous about her visit  Pulse 89   Temp 36.5 C (97.7 F) (Thermal Scan)   Ht 1.702 m (5\' 7" )   Wt 130 kg (287 lb)   BMI 44.95 kg/m         Physical examination:  Constitutional: Appears well, no acute distress.    Respiratory: Non-labored breathing  GI: soft, not distended, no masses.   GU: No CVA tenderness, Bladder Non-tender  Musculoskeletal: Moves all extremities   Psychiatric: Mood and affect normal.     Labs:  Pertinent labs reviewed.      Imaging studies:  Pertinent imaging studies reviewed.    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  At this point, I do recommend a radical nephrectomy.  Options do include continued observation, repeat partial nephrectomy, ablated techniques, but right nephrectomy being most definitive surgery.  There is a downside of losing  renal tissue and removed already present renal insufficiency.  Patient is also risk presents due to her obesity and  diabetes both of these conditions have improved significantly over the past several months.    While the differential diagnosis does include benign tumors like oncocytoma and angiomyolipoma, this is most likely a renal cell carcinoma.  I discussed treatment options to include partial nephrectomy, radical nephrectomy, ablative techniques, and the option of a renal biopsy prior to treatment.  The tumor does [not] seem amenable to partial nephrectomy because of prior surgery.  I discussed the risks, benefits, and alternatives of laparoscopic left nephrectomy.  Risks include, but are not limited to bleeding, which could be severe, infection, and damage to the intra abdominal contents including bowel, liver, and major blood vessels.  Risks also include blood clots which could embolize to the lungs, heart attack, stroke, and death.  All questions were answered and the patient agrees to proceed.       Orders Placed This Encounter   . CBC W/AUTO DIFF   . COMPREHENSIVE METABOLIC PANEL, NON-FASTING   . ECG 12-LEAD   . TYPE AND SCREEN             This note was completed using voice recognition technology. Please excuse any errors that have occurred as a result and contact me if any parts of the note are unclear.

## 2020-03-16 ENCOUNTER — Telehealth (RURAL_HEALTH_CENTER): Payer: Self-pay

## 2020-03-16 ENCOUNTER — Other Ambulatory Visit (HOSPITAL_BASED_OUTPATIENT_CLINIC_OR_DEPARTMENT_OTHER): Payer: Self-pay

## 2020-03-16 ENCOUNTER — Encounter (HOSPITAL_BASED_OUTPATIENT_CLINIC_OR_DEPARTMENT_OTHER): Payer: Self-pay

## 2020-03-16 ENCOUNTER — Ambulatory Visit
Admission: RE | Admit: 2020-03-16 | Discharge: 2020-03-16 | Disposition: A | Discharge status: Home / Self Care | Payer: 59 | Source: Ambulatory Visit | Attending: Urology | Admitting: Urology

## 2020-03-16 DIAGNOSIS — Z01818 Encounter for other preprocedural examination: Secondary | ICD-10-CM

## 2020-03-16 HISTORY — DX: Morbid (severe) obesity due to excess calories: E66.01

## 2020-03-16 HISTORY — DX: Polyneuropathy, unspecified: G62.9

## 2020-03-16 LAB — ECG 12-LEAD
Atrial Rate: 82 {beats}/min
Calculated P Axis: 13 degrees
Calculated T Axis: 43 degrees
PR Interval: 118 ms
QRS Duration: 84 ms
QT Interval: 370 ms
QTC Calculation: 432 ms
Ventricular rate: 82 {beats}/min

## 2020-03-16 NOTE — Telephone Encounter (Signed)
Patient called and requested to speak with Ocie Doyne. I explained to patient that Hassan Rowan was out of the office this week and would not be back until next week.    Patient said that Hassan Rowan told her she could always call the office and talk should she need to with Hassan Rowan.    Patient wanted Hassan Rowan to be aware that she is scheduled to have left kidney removed on 03/24/20 as her cancer has returned.    I explained that I would forward the information to [place]. AMS

## 2020-03-16 NOTE — Nurses Notes (Signed)
Patient is asymptomatic at this time for any of the eight COVID-19 symptoms. Patient has been instructed to quarantine for seven days if possible, but must perform strict isolation for at least 48 hours prior to procedure. Patient will take temperature 2 times a day, if they have a thermometer, and bring the log with them day of surgery. Procedure is classified as a category 2 procedure with a planned hospital admission so requires a pre-operative COVID-19 test to be completed at least 5 days prior to procedure. Patient was instructed to drive up to tent behind [facility] to be swabbed and return home after to quarantine. Any questions patient can call surgeons office for more instruction.

## 2020-03-17 ENCOUNTER — Other Ambulatory Visit (HOSPITAL_BASED_OUTPATIENT_CLINIC_OR_DEPARTMENT_OTHER): Payer: Self-pay

## 2020-03-19 ENCOUNTER — Ambulatory Visit: Payer: 59 | Attending: Urology

## 2020-03-19 DIAGNOSIS — Z20822 Contact with and (suspected) exposure to covid-19: Secondary | ICD-10-CM | POA: Insufficient documentation

## 2020-03-19 DIAGNOSIS — Z01818 Encounter for other preprocedural examination: Secondary | ICD-10-CM

## 2020-03-19 DIAGNOSIS — Z01812 Encounter for preprocedural laboratory examination: Secondary | ICD-10-CM | POA: Insufficient documentation

## 2020-03-19 LAB — COVID-19 ~~LOC~~ MOLECULAR LAB TESTING: SARS-CoV-2: NOT DETECTED

## 2020-03-23 ENCOUNTER — Telehealth (INDEPENDENT_AMBULATORY_CARE_PROVIDER_SITE_OTHER): Payer: Self-pay | Admitting: Urology

## 2020-03-23 MED ORDER — CEFAZOLIN 2 GRAM/50 ML IN DEXTROSE (ISO-OSMOTIC) INTRAVENOUS PIGGYBACK
2.0000 g | INJECTION | Freq: Once | INTRAVENOUS | Status: AC
Start: 2020-03-24 — End: 2020-03-24
  Administered 2020-03-24: 2 g via INTRAVENOUS
  Filled 2020-03-23: qty 50

## 2020-03-23 NOTE — Telephone Encounter (Signed)
Patient is to arrive at [facility] at 930am on 03/24/20.  NPO after midnight.  Patient notified.Galen Manila, LPN  [id number]/[id number], 97:67

## 2020-03-24 ENCOUNTER — Inpatient Hospital Stay
Admission: RE | Admit: 2020-03-24 | Discharge: 2020-03-29 | DRG: 657 | Disposition: A | Discharge status: Home / Self Care | Payer: 59 | Source: Ambulatory Visit | Attending: Urology | Admitting: Urology

## 2020-03-24 ENCOUNTER — Encounter (HOSPITAL_COMMUNITY): Payer: Self-pay | Admitting: Urology

## 2020-03-24 ENCOUNTER — Inpatient Hospital Stay (HOSPITAL_COMMUNITY): Payer: 59 | Admitting: Pain Medicine

## 2020-03-24 ENCOUNTER — Inpatient Hospital Stay (HOSPITAL_COMMUNITY): Payer: 59 | Admitting: Student in an Organized Health Care Education/Training Program

## 2020-03-24 ENCOUNTER — Encounter (HOSPITAL_COMMUNITY)
Admission: RE | Disposition: A | Discharge status: Home / Self Care | Payer: Self-pay | Source: Ambulatory Visit | Attending: Urology

## 2020-03-24 ENCOUNTER — Other Ambulatory Visit: Payer: Self-pay

## 2020-03-24 ENCOUNTER — Inpatient Hospital Stay (HOSPITAL_COMMUNITY): Payer: 59

## 2020-03-24 DIAGNOSIS — K660 Peritoneal adhesions (postprocedural) (postinfection): Secondary | ICD-10-CM | POA: Diagnosis present

## 2020-03-24 DIAGNOSIS — I10 Essential (primary) hypertension: Secondary | ICD-10-CM | POA: Diagnosis present

## 2020-03-24 DIAGNOSIS — E875 Hyperkalemia: Secondary | ICD-10-CM | POA: Diagnosis not present

## 2020-03-24 DIAGNOSIS — N736 Female pelvic peritoneal adhesions (postinfective): Secondary | ICD-10-CM

## 2020-03-24 DIAGNOSIS — N179 Acute kidney failure, unspecified: Secondary | ICD-10-CM | POA: Diagnosis not present

## 2020-03-24 DIAGNOSIS — N1832 Chronic kidney disease, stage 3b: Secondary | ICD-10-CM | POA: Diagnosis present

## 2020-03-24 DIAGNOSIS — Z79899 Other long term (current) drug therapy: Secondary | ICD-10-CM

## 2020-03-24 DIAGNOSIS — N2581 Secondary hyperparathyroidism of renal origin: Secondary | ICD-10-CM | POA: Diagnosis present

## 2020-03-24 DIAGNOSIS — Z6841 Body Mass Index (BMI) 40.0 and over, adult: Secondary | ICD-10-CM

## 2020-03-24 DIAGNOSIS — E1122 Type 2 diabetes mellitus with diabetic chronic kidney disease: Secondary | ICD-10-CM | POA: Diagnosis present

## 2020-03-24 DIAGNOSIS — J9811 Atelectasis: Secondary | ICD-10-CM | POA: Diagnosis not present

## 2020-03-24 DIAGNOSIS — E872 Acidosis: Secondary | ICD-10-CM | POA: Diagnosis not present

## 2020-03-24 DIAGNOSIS — I129 Hypertensive chronic kidney disease with stage 1 through stage 4 chronic kidney disease, or unspecified chronic kidney disease: Secondary | ICD-10-CM | POA: Diagnosis present

## 2020-03-24 DIAGNOSIS — Z7984 Long term (current) use of oral hypoglycemic drugs: Secondary | ICD-10-CM

## 2020-03-24 DIAGNOSIS — Z905 Acquired absence of kidney: Secondary | ICD-10-CM

## 2020-03-24 DIAGNOSIS — E119 Type 2 diabetes mellitus without complications: Secondary | ICD-10-CM | POA: Diagnosis present

## 2020-03-24 DIAGNOSIS — C642 Malignant neoplasm of left kidney, except renal pelvis: Principal | ICD-10-CM | POA: Diagnosis present

## 2020-03-24 HISTORY — PX: NEPHRECTOMY: SHX65

## 2020-03-24 HISTORY — DX: Acquired absence of kidney: Z90.5

## 2020-03-24 HISTORY — PX: LAPAROSCOPIC NEPHRECTOMY, HAND ASSISTED: SHX1929

## 2020-03-24 LAB — BASIC METABOLIC PANEL
ANION GAP: 10 mmol/L (ref 4–13)
BUN/CREA RATIO: 35 — ABNORMAL HIGH (ref 6–22)
BUN: 61 mg/dL — ABNORMAL HIGH (ref 8–25)
CALCIUM: 9.8 mg/dL (ref 8.5–10.0)
CHLORIDE: 107 mmol/L (ref 96–111)
CO2 TOTAL: 20 mmol/L — ABNORMAL LOW (ref 22–30)
CREATININE: 1.76 mg/dL — ABNORMAL HIGH (ref 0.60–1.05)
ESTIMATED GFR: 33 mL/min/BSA — ABNORMAL LOW (ref >=60)
GLUCOSE: 217 mg/dL — ABNORMAL HIGH (ref 65–125)
POTASSIUM: 4.7 mmol/L (ref 3.5–5.1)
SODIUM: 137 mmol/L (ref 136–145)

## 2020-03-24 LAB — POC FINGERSTICK GLUCOSE - BMC/JMC (RESULTS)
GLUCOSE, POC: 239 mg/dL — ABNORMAL HIGH (ref 60–100)
GLUCOSE, POC: 243 mg/dL — ABNORMAL HIGH (ref 60–100)
GLUCOSE, POC: 223 mg/dL — ABNORMAL HIGH (ref 60–100)

## 2020-03-24 SURGERY — NEPHRECTOMY RADICAL LAPAROSCOPIC HAND ASSISTED
Anesthesia: General | Site: Abdomen | Laterality: Left | Wound class: Clean Contaminated Wounds-The respiratory, GI, Genital, or urinary

## 2020-03-24 MED ORDER — SUGAMMADEX 100 MG/ML INTRAVENOUS SOLUTION
Freq: Once | INTRAVENOUS | Status: DC | PRN
Start: 2020-03-24 — End: 2020-03-24
  Administered 2020-03-24: 260 mg via INTRAVENOUS

## 2020-03-24 MED ORDER — PROCHLORPERAZINE EDISYLATE 10 MG/2 ML (5 MG/ML) INJECTION SOLUTION
5.0000 mg | INTRAMUSCULAR | Status: DC | PRN
Start: 2020-03-24 — End: 2020-03-24

## 2020-03-24 MED ORDER — SODIUM CHLORIDE 0.9 % IRRIGATION SOLUTION
Freq: Once | Status: DC | PRN
Start: 2020-03-24 — End: 2020-03-24
  Administered 2020-03-24: 1000 mL

## 2020-03-24 MED ORDER — LOSARTAN 25 MG TABLET
50.00 mg | ORAL_TABLET | Freq: Every day | ORAL | Status: DC
Start: 2020-03-25 — End: 2020-03-26
  Administered 2020-03-25: 50 mg via ORAL
  Filled 2020-03-24 (×2): qty 2

## 2020-03-24 MED ORDER — ESCITALOPRAM 10 MG TABLET
10.00 mg | ORAL_TABLET | Freq: Every day | ORAL | Status: DC
Start: 2020-03-25 — End: 2020-03-29
  Administered 2020-03-25 – 2020-03-29 (×5): 10 mg via ORAL
  Filled 2020-03-24 (×7): qty 1

## 2020-03-24 MED ORDER — LIDOCAINE HCL 10 MG/ML (1 %) INJECTION SOLUTION
Freq: Once | INTRAMUSCULAR | Status: AC | PRN
Start: 2020-03-24 — End: 2020-03-24
  Administered 2020-03-24: 1 mL

## 2020-03-24 MED ORDER — LACTATED RINGERS INTRAVENOUS SOLUTION
INTRAVENOUS | Status: DC
Start: 2020-03-24 — End: 2020-03-25

## 2020-03-24 MED ORDER — METFORMIN 500 MG TABLET
500.00 mg | ORAL_TABLET | Freq: Every morning | ORAL | Status: DC
Start: 2020-03-25 — End: 2020-03-26
  Administered 2020-03-25 – 2020-03-26 (×2): 0 mg via ORAL
  Filled 2020-03-24 (×2): qty 1

## 2020-03-24 MED ORDER — BUPIVACAINE LIPOSOME(PF) 1.3 %(13.3 MG/ML) SUSPENSION FOR INFILTRATION
Freq: Once | Status: DC | PRN
Start: 2020-03-24 — End: 2020-03-24
  Administered 2020-03-24: 20 mL

## 2020-03-24 MED ORDER — OXYCODONE-ACETAMINOPHEN 5 MG-325 MG TABLET
1.00 | ORAL_TABLET | ORAL | Status: DC | PRN
Start: 2020-03-24 — End: 2020-03-29
  Administered 2020-03-26 – 2020-03-27 (×2): 1 via ORAL
  Filled 2020-03-24 (×2): qty 1

## 2020-03-24 MED ORDER — DAPAGLIFLOZIN PROPANEDIOL 5 MG TABLET
5.00 mg | ORAL_TABLET | Freq: Every day | ORAL | Status: DC
Start: 2020-03-25 — End: 2020-03-29
  Administered 2020-03-25 – 2020-03-29 (×5): 5 mg via ORAL
  Filled 2020-03-24 (×7): qty 1

## 2020-03-24 MED ORDER — ATORVASTATIN 10 MG TABLET
10.00 mg | ORAL_TABLET | Freq: Every evening | ORAL | Status: DC
Start: 2020-03-24 — End: 2020-03-24

## 2020-03-24 MED ORDER — ROCURONIUM 10 MG/ML INTRAVENOUS SOLUTION
Freq: Once | INTRAVENOUS | Status: DC | PRN
Start: 2020-03-24 — End: 2020-03-24
  Administered 2020-03-24: 10 mg via INTRAVENOUS
  Administered 2020-03-24: 20 mg via INTRAVENOUS
  Administered 2020-03-24: 50 mg via INTRAVENOUS

## 2020-03-24 MED ORDER — LIDOCAINE (PF) 100 MG/5 ML (2 %) INTRAVENOUS SYRINGE
INJECTION | Freq: Once | INTRAVENOUS | Status: DC | PRN
Start: 2020-03-24 — End: 2020-03-24
  Administered 2020-03-24: 80 mg via INTRAVENOUS

## 2020-03-24 MED ORDER — BUPIVACAINE LIPOSOME(PF) 1.3 %(13.3 MG/ML) SUSPENSION FOR INFILTRATION
20.0000 mL | Freq: Once | Status: AC
Start: 2020-03-24 — End: 2020-03-24
  Administered 2020-03-24: 20 mL
  Filled 2020-03-24: qty 20

## 2020-03-24 MED ORDER — AMLODIPINE 5 MG TABLET
2.50 mg | ORAL_TABLET | Freq: Every day | ORAL | Status: DC
Start: 2020-03-25 — End: 2020-03-25

## 2020-03-24 MED ORDER — HYDROMORPHONE 2 MG/ML INJECTION SOLUTION
Freq: Once | INTRAMUSCULAR | Status: DC | PRN
Start: 2020-03-24 — End: 2020-03-24
  Administered 2020-03-24 (×2): 1 mg via INTRAVENOUS

## 2020-03-24 MED ORDER — HYDROMORPHONE 0.5 MG/0.5 ML INJECTION SYRINGE
0.5000 mg | INJECTION | INTRAMUSCULAR | Status: DC | PRN
Start: 2020-03-24 — End: 2020-03-28
  Administered 2020-03-24 (×2): 0.5 mg via INTRAVENOUS
  Filled 2020-03-24 (×2): qty 0.5

## 2020-03-24 MED ORDER — FENTANYL (PF) 50 MCG/ML INJECTION SOLUTION
Freq: Once | INTRAMUSCULAR | Status: AC | PRN
Start: 2020-03-24 — End: 2020-03-24
  Administered 2020-03-24: 50 ug via INTRAVENOUS

## 2020-03-24 MED ORDER — SODIUM CHLORIDE 0.9 % (FLUSH) INJECTION SYRINGE
10.00 mL | INJECTION | INTRAMUSCULAR | Status: DC | PRN
Start: 2020-03-24 — End: 2020-03-29
  Administered 2020-03-24: 10 mL via INTRAVENOUS

## 2020-03-24 MED ORDER — PROCHLORPERAZINE EDISYLATE 10 MG/2 ML (5 MG/ML) INJECTION SOLUTION
10.00 mg | Freq: Four times a day (QID) | INTRAMUSCULAR | Status: DC | PRN
Start: 2020-03-24 — End: 2020-03-29
  Administered 2020-03-24 – 2020-03-26 (×3): 10 mg via INTRAVENOUS
  Filled 2020-03-24 (×3): qty 2

## 2020-03-24 MED ORDER — MORPHINE 4 MG/ML INTRAVENOUS SOLUTION
2.00 mg | INTRAVENOUS | Status: DC | PRN
Start: 2020-03-24 — End: 2020-03-29
  Administered 2020-03-25: 2 mg via INTRAVENOUS
  Filled 2020-03-24 (×2): qty 1

## 2020-03-24 MED ORDER — SODIUM CHLORIDE 0.9 % (FLUSH) INJECTION SYRINGE
10.00 mL | INJECTION | Freq: Three times a day (TID) | INTRAMUSCULAR | Status: DC
Start: 2020-03-24 — End: 2020-03-29
  Administered 2020-03-24 – 2020-03-25 (×2): 0 mL via INTRAVENOUS
  Administered 2020-03-25: 10 mL via INTRAVENOUS
  Administered 2020-03-25 – 2020-03-26 (×3): 0 mL via INTRAVENOUS
  Administered 2020-03-26 – 2020-03-28 (×6): 10 mL via INTRAVENOUS
  Administered 2020-03-28: 0 mL via INTRAVENOUS
  Administered 2020-03-29: 10 mL via INTRAVENOUS

## 2020-03-24 MED ORDER — ONDANSETRON HCL (PF) 4 MG/2 ML INJECTION SOLUTION
Freq: Once | INTRAMUSCULAR | Status: DC | PRN
Start: 2020-03-24 — End: 2020-03-24
  Administered 2020-03-24: 4 mg via INTRAVENOUS

## 2020-03-24 MED ORDER — PROPOFOL 10 MG/ML IV BOLUS
INJECTION | Freq: Once | INTRAVENOUS | Status: DC | PRN
Start: 2020-03-24 — End: 2020-03-24
  Administered 2020-03-24: 200 mg via INTRAVENOUS

## 2020-03-24 MED ORDER — INSULIN REGULAR HUMAN 100 UNIT/ML INJECTION SSIP - CITY
1.00 [IU] | Freq: Four times a day (QID) | INTRAMUSCULAR | Status: DC
Start: 2020-03-24 — End: 2020-03-26
  Administered 2020-03-24 – 2020-03-25 (×4): 3 [IU] via SUBCUTANEOUS
  Administered 2020-03-25: 5 [IU] via SUBCUTANEOUS
  Administered 2020-03-25: 1 [IU] via SUBCUTANEOUS
  Administered 2020-03-26 (×2): 3 [IU] via SUBCUTANEOUS
  Filled 2020-03-24: qty 3

## 2020-03-24 MED ORDER — DEXMEDETOMIDINE 4 MCG/ML IV DILUTION
Freq: Once | INTRAMUSCULAR | Status: DC | PRN
Start: 2020-03-24 — End: 2020-03-24
  Administered 2020-03-24: 12 ug via INTRAVENOUS

## 2020-03-24 MED ORDER — ATORVASTATIN 40 MG TABLET
20.00 mg | ORAL_TABLET | Freq: Every evening | ORAL | Status: DC
Start: 2020-03-24 — End: 2020-03-29
  Administered 2020-03-24 – 2020-03-28 (×5): 20 mg via ORAL
  Filled 2020-03-24 (×5): qty 1

## 2020-03-24 MED ORDER — TIOTROPIUM BROMIDE 2.5 MCG/ACTUATION MIST FOR INHALATION
2.0000 | Freq: Every day | RESPIRATORY_TRACT | Status: DC
Start: 2020-03-24 — End: 2020-03-26
  Filled 2020-03-24: qty 4

## 2020-03-24 MED ORDER — OXYCODONE-ACETAMINOPHEN 5 MG-325 MG TABLET
2.00 | ORAL_TABLET | ORAL | Status: DC | PRN
Start: 2020-03-24 — End: 2020-03-29
  Administered 2020-03-24 – 2020-03-27 (×8): 2 via ORAL
  Filled 2020-03-24 (×8): qty 2

## 2020-03-24 MED ORDER — MIDAZOLAM 1 MG/ML INJECTION SOLUTION
Freq: Once | INTRAMUSCULAR | Status: AC | PRN
Start: 2020-03-24 — End: 2020-03-24
  Administered 2020-03-24: 2 mg via INTRAVENOUS

## 2020-03-24 MED ORDER — FENTANYL (PF) 50 MCG/ML INJECTION SOLUTION
Freq: Once | INTRAMUSCULAR | Status: DC | PRN
Start: 2020-03-24 — End: 2020-03-24
  Administered 2020-03-24: 50 ug via INTRAVENOUS

## 2020-03-24 MED ORDER — ACETAMINOPHEN 1,000 MG/100 ML (10 MG/ML) INTRAVENOUS SOLUTION
Freq: Once | INTRAVENOUS | Status: DC | PRN
Start: 2020-03-24 — End: 2020-03-24
  Administered 2020-03-24: 1000 mg via INTRAVENOUS

## 2020-03-24 SURGICAL SUPPLY — 46 items
ADHESIVE TISSUE EXOFIN 1.0ML PREMIERPRO EXOFIN (SEALANTS) ×1
AGENT HMST 5ML MATRIX FLSL (SEALANTS)
APPL 70% ISPRP 2% CHG 26ML 13._2X13.2IN CHLRPRP PREP DEHP-FR (WOUND CARE/ENTEROSTOMAL SUPPLY) ×2
APPL 70% ISPRP 2% CHG 26ML CHLRPRP HI-LT ORNG PREP STRL LF  DISP CLR (WOUND CARE SUPPLY) ×2 IMPLANT
APPLIER E-CLP2 SUP INTLK 29CM 10MM PSTL GRIP GLARE RST SAF (INSTRUMENTS ENDOMECHANICAL) ×1
APPLIER E-CLP2 SUP INTLK 29CM 10MM PSTL GRIP GLARE RST SAF INTLK HNDL 20 ML CLIP TI INTERNAL CLIP (ENDOSCOPIC SUPPLIES) ×1 IMPLANT
CONV USE ITEM 156524 - ADHESIVE TISSUE EXOFIN 1.0ML_PREMIERPRO EXOFIN (SEALANTS) ×1 IMPLANT
CONV USE ITEM 337902 - KIT SURG LRG STUP NONST DISP LF (CUSTOM TRAYS & PACK) ×1 IMPLANT
DEVICE SUT ENDO CLOSE 10MM TROCAR ST SPRG LD BLUNT STY ENDOS LF (ENDOSCOPIC SUPPLIES) ×1 IMPLANT
DEVICE SUT ENDO CLOSE 10MM TRO_CAR ST SPRG LD BLNT STY ENDOS (INSTRUMENTS ENDOMECHANICAL) ×1
DISC USE ITEM 122805 - AGENT HMST 5ML MATRIX FLSL (SEALANTS) IMPLANT
DISCONTINUED USE 102429 - APPL ENDOS 474MM BXTR MLBL TIP DEHP-FR STRL LF  DISP (GI LAB SUPPLIES) IMPLANT
DISCONTINUED USE 330324 - NEEDLE HYPO 22GA 1.5IN REG WL_SFGLD POLYPROP REG BVL LL HUB (NEEDLES & SYRINGE SUPPLIES) ×1 IMPLANT
DRAPE 2 INCS FILM ANTIMIC 23X17IN IOBN STRL SURG (PROTECTIVE PRODUCTS/GARMENTS) ×1 IMPLANT
DRAPE LPCHL ABS REINF FENESTRATE ADH TRGH 122X102X78IN LF  STRL DISP SURG SMS 12X12IN (CUSTOM TRAYS & PACK) ×1 IMPLANT
DRAPE LPCHL ABS REINF FENESTRA_TE ADH TRGH 122X102X78IN LF (CUSTOM TRAYS & PACK) ×1
ENDO RELOAD SIG45CTAMT 45_CVD VASC 6EA/BX (SUTURE STAPLING DEVICES) ×1
GARMENT COMPRESS MED CALF CENTAURA NYL VASOGRAD LTWT BRTHBL SEQ FIL BLU 18- IN (ORTHOPEDICS (NOT IMPLANTS)) ×1 IMPLANT
GARMENT COMPRESS MED CALF CENT_AURA NYL VASOGRAD LTWT BRTHBL (ORTHOPEDICS (NOT IMPLANTS)) ×1
GOWN SURG XL L4 REINF HKLP CLSR BRTHBL FILM SLEEVE STRL LF  DISP BLU SIRUS SMS PE 47IN (PROTECTIVE PRODUCTS/GARMENTS) ×2 IMPLANT
HEMOSTAT ABS 14X2IN FLXB SHR W_V SRGCL STRL DISP (WOUND CARE SUPPLY) ×1 IMPLANT
IRR SUCT STRKFL2 TIP STRL LF  DISP (IRR) ×1 IMPLANT
NEEDLE HYPO  18GA 1.5IN REG WL SFGLD POLYPROP REG BVL LL SHIELD MECH DEHP-FR IM PNK STRL LF  DISP (NEEDLES & SYRINGE SUPPLIES) ×1 IMPLANT
NEEDLE HYPO 18GA 1.5IN REG WL_SFGLD POLYPROP LL HUB SHIELD (NEEDLES & SYRINGE SUPPLIES) ×1
PACK LRG SET UP DYNJ906636A CS/2 (CUSTOM TRAYS & PACK) ×1
POUCH SPEC RETR 34.5CM 15MM E-CTCH POLYUR FLXB LONG CYL TUBE 29.5CM STRL LF  DISP (ENDOSCOPIC SUPPLIES) IMPLANT
RELOAD STPLR MED CURVE 45MM TRI-STPL 2 VAS STRL LF  TAN (ENDOSCOPIC SUPPLIES) ×5 IMPLANT
RELOAD STPLR MED THK CURVE 45MM TRI-STPL 2 STRL LF  PURP (SUTURE STAPLING DEVICES) ×1 IMPLANT
SEALDIVD LAPSCP 37CM LIGASURE 350D 18.5MM MARYLAND CURVE JAW NANO COAT 20.3MM VLAB FT10 LS10 (SURGICAL INSTRUMENTS) ×1 IMPLANT
SOL ANFG DFGR ISOPRPNL PAD OVAL BTL NABRSV ADH STRL LF  DISP (KITS & TRAYS (DISPOSABLE)) ×1 IMPLANT
SOL IRRG 0.9% NACL 500ML PLASTIC PR BTL ISTNC N-PYRG STRL LF (SOLUTIONS) ×1 IMPLANT
SOLUTION ANTI FOG W/SPONGE 280101 DEFOG ENDOMATE 20EA/CS (KITS & TRAYS (DISPOSABLE)) ×1
SPONGE LAP 18X18IN STRL (WOUND CARE SUPPLY) ×2 IMPLANT
STAPLER INTERNAL 16CMX4MM PVC STD UNIV TISS STRL LF  DISP EGIA ENDOS 12MM (ENDOSCOPIC SUPPLIES) ×1 IMPLANT
STRIP 4X.5IN STRSTRP PLSTR REINF SKNCLS WHT STRL LF (WOUND CARE SUPPLY) ×1 IMPLANT
SYRINGE LL 10ML LF  STRL GRAD N-PYRG DEHP-FR PVC FREE MED DISP (NEEDLES & SYRINGE SUPPLIES) ×2 IMPLANT
SYRINGE LL 10ML LF STRL MED D ISP (NEEDLES & SYRINGE SUPPLIES) ×2
SYSTEM GELPORT HAND ACCESS C8XX2 (INSTRUMENTS ENDOMECHANICAL) ×1
SYSTEM GELPORT HAND ACCESS_C8XX2 (ENDOSCOPIC SUPPLIES) ×1 IMPLANT
TRAY CATH 16FR LUBRISIL FOLEY_ADV DRAIN BAG ANTIREFLUX (CATHETERS) ×1 IMPLANT
TROCAR BLADELESS 12MM_6EA/BX DISP (INSTRUMENTS ENDOMECHANICAL) ×1
TROCAR BLADELESS 5MM NONB5STF_6EA/BX DISP (INSTRUMENTS ENDOMECHANICAL) ×1
TROCAR LAPSCP STD 100MM 12MM VERSAONE FIX CANN BLDLS DLPHN NOSE TIP STRL LF  DISP (ENDOSCOPIC SUPPLIES) ×1 IMPLANT
TROCAR LAPSCP STD 100MM 5MM VERSAONE FIX CANN BLDLS DLPHN NOSE TIP STRL LF  DISP (ENDOSCOPIC SUPPLIES) ×1 IMPLANT
TUBE PNEUMO HEATED BX/10 0620040690 (Connecting Tubes/Misc) ×1
TUBING INSFL PNEUMOSURE THRMPLST 12.52X5.83IN C19.84LB 150VA 18.7IN 50104F HEAT HI FLOW RATE SET (Connecting Tubes/Misc) ×1 IMPLANT

## 2020-03-24 NOTE — Anesthesia Preprocedure Evaluation (Signed)
ANESTHESIA PRE-OP EVALUATION  Planned Procedure: NEPHRECTOMY RADICAL LAPAROSCOPIC HAND ASSISTED (Left )  Review of Systems     anesthesia history negative               Pulmonary     Cardiovascular    ECG reviewed ,       GI/Hepatic/Renal        Endo/Other          Neuro/Psych/MS        Cancer                     Physical Assessment      Airway       Mallampati: IV      Neck ROM: full  Mouth Opening: good.            Dental           (+) missing           Pulmonary    Breath sounds clear to auscultation       Cardiovascular    Rhythm: regular  Rate: Normal       Other findings            Plan  ASA 4     Planned anesthesia type: general endotracheal              Intravenous induction       Anesthetic plan and risks discussed with patient.          Patient's NPO status is appropriate for Anesthesia.

## 2020-03-24 NOTE — Interval H&P Note (Signed)
[facility]      H&P UPDATE FORM                                                                                  [name], [name], 54 y.o. female  Date of Admission:  03/24/2020  Date of Birth:  [birth year]    03/24/2020    STOP: IF H&P IS GREATER THAN 30 DAYS FROM SURGICAL DAY COMPLETE NEW H&P IS REQUIRED.     H & P updated the day of the procedure.  1.  H&P completed within 30 days of surgical procedure and has been reviewed within 24 hours of admission but prior to surgery or a procedure requiring anesthesia services, the patient has been examined, and no change has occured in the patients condition since the H&P was completed.       Change in medications: No        Patient's last menstrual period was 10/24/2014 (approximate).      Comments:     2.  Patient continues to be appropriate candidate for planned surgical procedure. YES    Ruta Hinds, MD

## 2020-03-24 NOTE — Consults (Signed)
[facility]  [place], [place] [address]    MEDICINE CONSULT    [name], [name], 54 y.o. female  Date of Admission:  03/24/2020  Date of service: 03/24/2020  Date of Birth:  08-Apr-1966    Hospital Day:  LOS: 0 days     Service: Urology    Requesting MD: Kerin Perna    Information Obtained from: chart review. Pt is sleeping. Just returned from OR. Vitals stable. Wakes up to name call and easily goes back to sleep.    Chief Complaint:  resting comfortably      HPI/Discussion:  55 y.o. female with a history of left partial nephrectomy in January of 2019 presents for follow-up.  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  A biopsy obtained on the 14th of this month shows that there is in fact malignancy in this lesion. S/p L nephrectomy today.  History of hypertension, hyperlipidemia, DM, CKD III follows up w Dr. Judeth Horn.     Past Medical History:   Diagnosis Date   . Anxiety    . Arthritis    . Cellulitis 10/24/14    left foot   . 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 Depression    . Diabetes mellitus (CMS [place])    . Essential hypertension    . Fatty liver    . Headache(784.0)    . Hyperlipidemia    . Irritable bowel syndrome    . Macular edema    . Morbid obesity (CMS [place])    . Peripheral neuropathy     bilateral feet   . Type 2 diabetes mellitus (CMS HCC)    . White coat hypertension        Past Surgical History:   Procedure Laterality Date   . ECTOPIC PREGNANCY SURGERY  1999   . HX CATARACT REMOVAL Bilateral    . HX CHOLECYSTECTOMY  09/10/96   . HX PELVIC LAPAROSCOPY  05/24/98    Ectopic pregnancy   . HX TUBAL LIGATION  09/10/2012    Filshie Clips   . HYMENECTOMY  1996   . LAPAROSCOPIC PARTIAL NEPHRECTOMY Left 10/24/2017    hand-assisted   . RENAL BIOPSY, PERCUTANEOUS  03/08/2020    clear cell renal cell carcinoma       Medications Prior to Admission      Prescriptions    albuterol sulfate (PROVENTIL OR VENTOLIN OR PROAIR) 90 mcg/actuation Inhalation HFA Aerosol Inhaler    Take 2 Puffs by inhalation Every 4 hours as needed    amLODIPine (NORVASC) 2.5 mg Oral Tablet    Take 2.5 mg by mouth Once a day     ascorbic acid, vitamin C, (VITAMIN C) 500 mg Oral Tablet    Take 500 mg by mouth Once a day     atorvastatin (LIPITOR) 20 mg Oral Tablet    Take 20 mg by mouth Every evening    cholecalciferol, Vitamin D3, (VI-D-SOL) 10 mcg/mL (400 unit/mL) Oral Drops    Take 2,000 Units by mouth Once a day     empagliflozin (JARDIANCE) 10 mg Oral Tablet    Take 10 mg by mouth Once a day     escitalopram oxalate (LEXAPRO) 10 mg Oral Tablet    Take 10 mg by mouth Every night    Fish Oil-Omega-3 Fatty Acids 360-1,200 mg Oral Capsule  Take 2 Caps by mouth Twice daily    fluticasone (FLONASE) 50 mcg/actuation Nasal Spray, Suspension    1 Spray by Each Nostril route Once a day    furosemide (LASIX) 40 mg Oral Tablet    Take 40 mg by mouth Twice daily     glipiZIDE (GLUCOTROL) 5 mg Oral Tablet    Take 2.5 mg by mouth Twice a day before meals Take 30 minutes before meals    Patient not taking:  Reported on 03/08/2020    loperamide (IMODIUM) 2 mg Oral Capsule    Take 2 mg by mouth Every 4 hours as needed    loratadine (CLARITIN) 10 mg Oral Tablet    Take 10 mg by mouth Once per day as needed     losartan (COZAAR) 25 mg Oral Tablet    Take 2 Tabs (50 mg total) by mouth Once a day    magnesium oxide (MAG-OX) 400 mg (241.3 mg magnesium) Oral Tablet    Take 250 mg by mouth Once a day     metFORMIN (GLUCOPHAGE) 500 mg Oral Tablet    Take 500 mg by mouth Twice daily with food     tiotropium bromide (SPIRIVA HANDIHALER) 18 mcg Inhalation Capsule, w/Inhalation Device    Take 1 Cap (18 mcg total) by inhalation Once a day for 14 days    Patient taking differently:  Take 18 mcg by inhalation Once per day as needed       LR premix infusion, , Intravenous, Continuous  morphine 4 mg/mL injection, 2 mg,  Intravenous, Q4H PRN  oxyCODONE-acetaminophen (PERCOCET) 5-325mg  per tablet, 2 Tablet, Oral, Q4H PRN  oxyCODONE-acetaminophen (PERCOCET) 5-325mg  per tablet, 1 Tablet, Oral, Q4H PRN  prochlorperazine (COMPAZINE) 5 mg/mL injection, 5 mg, Intravenous, PACU Q15 Min PRN  SSIP insulin R human 100 units/mL injection, 1-12 Units, Subcutaneous, 4x/day AC      Allergies   Allergen Reactions   . Vicodin [Hydrocodone-Acetaminophen] Itching     Family History  unable to obtain pt is sedated just returned from [place] History  unable to obtain pt is sedated just returned from OR    ROS:   Other than ROS in the HPI, all other systems were negative.    EXAM:  Temperature: 36.4 C (97.5 F)  Heart Rate: 86  BP (Non-Invasive): (!) 157/78  Respiratory Rate: (!) 9  SpO2: 98 %  Pain Score (Numeric, Faces): 8  GENERAL: Sleeping, just returned from OR    HEENT: Normocephalic, anicteric. No pallor. PERRL.   NECK: Supple. No jugular venous distention.   LUNGS: Vesicular breath sounds are audible bilaterally. Good bilateral air entry.  No added sounds.   HEART: Both first and second sounds are audible, regular sinus rhythm.  No murmur.  ABDOMEN: Soft, bowel sounds are present   EXTREMITIES: No edema. Peripheral pulses are present.   CENTRAL NERVOUS SYSTEM: sleeping  SKIN: No rashes or bruises.   MUSCULOSKELETAL SYSTEM: No deformity or swelling.   PSYCHIATRIC: sleeping  HEMATOLOGIC: No ecchymosis, petechia or hematoma.      Labs:  I have reviewed all lab results.    Imaging Studies: MRI and CT     Assessment/Recommendations:     HTN: Well controlled. Cont Amlodipine Cozaar    DM: Hold home Po meds. Will order Sliding scale    S/p L nephrectomy. Mngmt per Dr. Kerin Perna    CKD III: F/u w Dr. Judeth Horn    Full code  DVT proph: per Dr.  Kerin Perna

## 2020-03-24 NOTE — Nurses Notes (Signed)
Pt slept post medicated with Percocet for L abdominal pain. Pt has not voided postop.

## 2020-03-24 NOTE — Anesthesia Transfer of Care (Signed)
ANESTHESIA TRANSFER OF CARE   [name] is a 54 y.o. ,female, Weight: 129 kg (285 lb)   had Procedure(s):  NEPHRECTOMY RADICAL LAPAROSCOPIC HAND ASSISTED  performed  03/24/20   Primary Service: Ruta Hinds, MD    Past Medical History:   Diagnosis Date   . Anxiety    . Arthritis    . Cellulitis 10/24/14    left foot   . 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 Depression    . Diabetes mellitus (CMS [place])    . Essential hypertension    . Fatty liver    . Headache(784.0)    . Hyperlipidemia    . Irritable bowel syndrome    . Macular edema    . Morbid obesity (CMS [place])    . Peripheral neuropathy     bilateral feet   . Type 2 diabetes mellitus (CMS HCC)    . White coat hypertension       Allergy History as of 03/24/20     HYDROCODONE-ACETAMINOPHEN       Noted Status Severity Type Reaction    10/24/14 1031 Oretha Milch, RN 10/24/14 Active Low  Itching              I completed my transfer of care / handoff to the receiving personnel during which we discussed:  Access, Airway, All key/critical aspects of case discussed, Analgesia, Antibiotics, Expectation of post procedure, Fluids/Product, Gave opportunity for questions and acknowledgement of understanding and PMHx    Post Location: PACU                                                                  Last OR Temp: Temperature: 36.3 C (97.3 F)  ABG:  POTASSIUM   Date Value Ref Range Status   03/24/2020 4.7 3.5 - 5.1 mmol/L Final   12/07/2014 4.1 3.5 - 5.0 mmol/L Final     KETONES   Date Value Ref Range Status   08/20/2019 Negative Negative mg/dL Final     KETONES,URINE   Date Value Ref Range Status   09/02/2012 NEGATIVE NEGATIVE mg/dL Final     CALCIUM   Date Value Ref Range Status   03/24/2020 9.8 8.5 - 10.0 mg/dL Final   12/07/2014 9.3 8.5 - 10.5 mg/dL Final     Calculated P Axis   Date Value Ref Range Status   03/15/2020 13 degrees Final     Calculated T Axis   Date Value Ref Range Status   03/15/2020 43 degrees Final     Airway:* No LDAs  found *  Blood pressure (!) 147/85, pulse 83, temperature 36.3 C (97.3 F), resp. rate 15, height 1.702 m (5\' 7" ), weight 129 kg (285 lb), last menstrual period 10/24/2014, SpO2 91 %.

## 2020-03-24 NOTE — Anesthesia Procedure Notes (Signed)
Arterial Line Procedure   Date/Time: 03/24/2020 10:55 AM   Pt location: At Bedside  Consent:     Consent given by:  Patient  Universal protocol:     Procedure explained and questions answered to patient or proxy's satisfaction: yes      Immediately prior to procedure a time out was called: yes      Patient identity confirmed:  Verbally with patient and arm band  Pre-procedure details:     Preparation: Preprocedure hand washing was performed; sterile field was maintained         Skin Prep used: Chlorhexidine gluconate  Anesthesia (see MAR for exact dosages):     Anesthesia method:  local infiltration  A 20 G Catheter type: Arrow 1 and 3/4 inch in length,  Placed on the right  radial artery  using anatomical landmarks, guidewire, palpation and Seldinger With  number of attempts:1.Secured with: transparent dressing   MEDICATIONS: midazolam (VERSED) 1 mg/mL injection, 2 mg  fentaNYL (SUBLIMAZE) 50 mcg/mL injection, 50 mcg  lidocaine 1% injection, 1 mL    Post-procedure details:    Patient tolerance of procedure:  Tolerated well, no immediate complications Waveform appropriate, Correlates with cuff, Flush/aspirate well and Calibrated  Complications:none  Performed By:  Performing provider: Oren Beckmann, CRNA Authorizing provider: Oren Beckmann, CRNA

## 2020-03-24 NOTE — Nurses Notes (Signed)
Pt more awake since her husband is here to visit. Assessment completed except for the depression screening. Pt still drowsy falling asleep between questions. # abdominal incisions are red but dry & intact. SL's are intact in the pt's R wrist & L arm. VS remain stable.

## 2020-03-24 NOTE — Care Plan (Signed)
Problem: Adult Inpatient Plan of Care  Goal: Plan of Care Review  Outcome: Ongoing (see interventions/notes)  Flowsheets (Taken 03/24/2020 1800)  Plan of Care Reviewed With:   patient   spouse  Note: Pt slept soundly upon arriving to the floor. Pt snored with periods of apnea. HOB elevated. Pt asked the admitting questions after her husband arrived & she was more alert. Pt was still drowsy & fell asleep between questions. VS stable. Afebrile. O2 kept on while pt sleepy. When aroused pt alert & appropriate until returning to sleep. Pt medicated x1 with Percocet tabs 2 for a # 10 pain in her L abdomen. Pt slept post Percocet. Pt's husband assisted her with some supper. Pt has a SL intact in her R hand & L arm.     Goal: Patient-Specific Goal (Individualized)  Outcome: Ongoing (see interventions/notes)  Flowsheets  Taken 03/24/2020 1800  Patient-Specific Goals (Include Timeframe): Recover & return home  Taken 03/24/2020 1745  Individualized Care Needs: Pt is a diabetic  Note: The pt's BS was 243 @ 4196 & received 3 units of Humulin R insulin coverage.  Goal: Absence of Hospital-Acquired Illness or Injury  Outcome: Ongoing (see interventions/notes)  Intervention: Identify and Manage Fall Risk  Flowsheets (Taken 03/24/2020 1630)  Safety Promotion/Fall Prevention:   safety round/check completed   fall prevention program maintained  Intervention: Prevent Skin Injury  Flowsheets (Taken 03/24/2020 1800)  Body Position: supine, head elevated  Intervention: Prevent and Manage VTE (Venous Thromboembolism) Risk  Flowsheets (Taken 03/24/2020 1630)  VTE Prevention/Management: sequential compression devices on  Intervention: Prevent Infection  Flowsheets (Taken 03/24/2020 1900)  Infection Prevention:   barrier precautions utilized   glycemic control managed   personal protective equipment utilized   promote handwashing   rest/sleep promoted   visitors restricted/screened   single patient room provided  Goal: Optimal Comfort and  Wellbeing  Outcome: Ongoing (see interventions/notes)  Intervention: Provide Person-Centered Care  Flowsheets (Taken 03/24/2020 1800)  Trust Relationship/Rapport:   care explained   questions answered  Goal: Rounds/Family Conference  Outcome: Ongoing (see interventions/notes)  Flowsheets (Taken 03/24/2020 1900)  Participants:   nursing   patient   physician   family     Problem: Fall Injury Risk  Goal: Absence of Fall and Fall-Related Injury  Outcome: Ongoing (see interventions/notes)  Intervention: Identify and Manage Contributors  Flowsheets (Taken 03/24/2020 1900)  Medication Review/Management:   medications reviewed   high-risk medications identified  Intervention: Promote [place] (Taken 03/24/2020 1630)  Safety Promotion/Fall Prevention:   safety round/check completed   fall prevention program maintained

## 2020-03-24 NOTE — OR PreOp (Signed)
Pre-operative temperature log completed by patient. Log reviewed by pre-operative nurse and log to be shared with operative team.

## 2020-03-24 NOTE — Nurses Notes (Signed)
Assumed care of patient from Leonia Reader at [phone]. Patient asleep - oxygen saturation was 95 -96% on 0.5l of O2 heart rate was 89-91. Oxygen was removed patient currently sating between 93-94% on room air with heart rate between 90 and 92. Will continue to monitor.

## 2020-03-24 NOTE — OR Surgeon (Signed)
OPERATIVE NOTE        PATIENT NAME:  [name]  MRN:  [id number]  DOB:  1966/08/28  DATE OF SERVICE:  03/24/2020      PREOPERATIVE DIAGNOSES:  Renal mass, left N28.89    POSTOPERATIVE DIAGNOSES:    Renal mass, left N28.89    NAME OF PROCEDURES:  left hand-assisted laparoscopic radical nephrectomy.   Lysis of adhesions    SURGEON:  Dr Ruta Hinds  ASSISTANT:  Dr. Kathline Magic    ANESTHESIA:  General.    OPERATIVE FINDINGS:  Significant adhesions of the omentum to abdominal wall, and perirenal fat to the spleen as well as colonic fat the retroperitoneum.    SPECIMENS: left Kidney    ESTIMATED BLOOD LOSS:  209 ml    COMPLICATIONS:  None    CONDITION:  Stable    DRAINS: A 16 French Foley catheter.     DISPOSITION:   PACU - hemodynamically stable.      INDICATIONS: [name] is a very pleasant 54 y.o. female who was found to have a renal mass during surveillance after partial nephrectomy in 2019 on the same kidney.  This was followed and seem to be enlarging and therefore biopsy was performed.  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 After discussion of all of these risks, benefits and alternatives, we decided to have the patient undergo a hand-assisted left-sided laparoscopic radical nephrectomy. A discussion was had with the patient about all of the risks and benefits including the risk of bleeding, infection, damage to bowel, liver and other adjacent organs, renal failure, hernia and need for other procedure, and the patient understands all of this and wishes to go forth with the surgery. The patient comes in today for the procedure.     DESCRIPTION OF PROCEDURE: The patient was brought to the operating room and anesthesia was induced. The patient was given antibiotics preoperatively for infection prophylaxis. Sequential compression devices were placed on the patient's lower extremities and kept on during the entire procedure.  A 16 French Foley catheter was inserted with ease under sterile  technique. The patient was then placed in a modified flank position with the left side up. All pressure points were padded during this case. The patient's up sided arm was then brought over to the contralateral side and again was carefully padded with towels, blankets and pillows. The patient was then secured to the bed using 3 inch tape and the entire abdomen was prepped and draped in standard surgical fashion. I reviewed the CT scan of the abdomen and pelvis again to confirm the location of the renal mass. This site had been marked preoperatively. A surgical pause was then performed to confirm the identity of the patient and the entire surgical team agreed to the procedure.     A 7.5 centimeter incision was then made in the left lower quadrant. This was done sharply using the scalpel blade and dissected further down to the level of the peritoneum using electrocautery. Care was taken to ensure that there was no evidence of any active bleeding seen. The peritoneum was then grasped and  entered sharply using Metzenbaum scissors and the peritoneum was opened up. This was then divided until it was able to accommodate one hands-breadth. The left hand was inserted with ease. The liver was palpated and the mesentery was palpated.  The hand port was then inserted with ease through the incision. It was palpated to ensure that the bowel was not caught in the  port. Once this was confirmed, a small 5 millimeter port was inserted above the level of the umbilicus to allow the camera after insufflation was achieved. The abdomen was insufflated with CO2 to a pressure of 15. A 15 millimeter port was then placed at the left lower quadrant about one handsbreadth lateral from the camera port. This was done using the nonbladed trocar. Once inside, the white line of Toldt was identified and this was taken down carefully using the Harmonic scalpel.  Approximately 30 minutes of lysis of adhesion was necessary to dissect the adhesions  described above.    At this point, the peritoneum reflection off of the kidney was taken down. The hilum was identified. Using blunt dissection and the harmonic scalpel, the renal artery and vein were encountered and carefully freed up to accommodate the stapler. The endovascular GIA stapler was then passed underneath the hilum and the hilum was divided using the devise after confirming proper placement.   The dissection was then carried in the cephalad direction, as well as taking down the lateral attachments. The dissection was carried further down and across the lateral attachments and the posterior attachments to divide and separate the kidney from the sidewall. The ureter was identified and divided with a vascular stapler load. At this point, the specimen was removed in its entirety using and endocatch bag and sent off to pathology for further evaluation.   The pneumoperitoneum was then dropped to a total of 5, and after having been maintained at 15 for the duration of the case. The hilum was inspected and there was no evidence of any active bleeding noted. The hilum and adrenal bed was carefully inspected and the decision was made to leave a Surgicel. This was placed at the area of the adrenal bed and near the spleen. The bed was then rotated back to neutral to allow the bowel to return to its normal position.   A [place] device was used to close the fascia on the 12 millimeter port under direct vision with 2 0 Vicryl suture. All the ports were then removed under direct vision and the hand port was removed. Palpation revealed no evidence of  any damage. 20 cc of Exparel was infused between the transversalis and internal oblique fascia circumferentially around the incision as well as subcutaneously.   All incisions were infused subcutaneously with Marcaine as well.  The fascia was then reapproximated using #1 double stranded PDS in a running fashion.  All the skin incisions were then closed using 4-0  Monocryl and Dermabond. The patient was returned to the supine position, was awakened from anesthesia. The patient was extubated and brought to the recovery room in good condition. At the end of the case, all instrument, sponge and needle counts were correct.  Dr. Karle Barr assisted with all aspects of the surgery including exposure, dissection, removal, and closure.  There were no other qualified assistants available.

## 2020-03-24 NOTE — Nurses Notes (Signed)
Pt received via bed from OR/RR via bed. Pt sleeping. VS: 36.3-85-15, 146/72, O2 sat 94 % on O2 NC.

## 2020-03-25 LAB — CBC WITH DIFF
BASOPHIL #: 0 10*3/uL (ref 0.00–0.10)
BASOPHIL %: 0 % (ref 0–3)
EOSINOPHIL #: 0 10*3/uL (ref 0.00–0.50)
EOSINOPHIL %: 0 % (ref 0–5)
HCT: 32.1 % — ABNORMAL LOW (ref 36.0–45.0)
HGB: 10.6 g/dL — ABNORMAL LOW (ref 12.0–15.5)
LYMPHOCYTE #: 1.4 10*3/uL (ref 1.00–4.80)
LYMPHOCYTE %: 19 % (ref 15–43)
MCH: 29.2 pg (ref 27.5–33.2)
MCHC: 33.1 g/dL (ref 32.0–36.0)
MCV: 88.1 fL (ref 82.0–97.0)
MONOCYTE #: 0.6 10*3/uL (ref 0.20–0.90)
MONOCYTE %: 8 % (ref 5–12)
MPV: 8.9 fL (ref 7.4–10.5)
NEUTROPHIL #: 5.5 10*3/uL (ref 1.50–6.50)
NEUTROPHIL %: 73 % (ref 43–76)
PLATELETS: 200 10*3/uL (ref 150–450)
RBC: 3.64 10*6/uL — ABNORMAL LOW (ref 4.00–5.10)
RDW: 14.6 % (ref 11.0–16.0)
WBC: 7.6 10*3/uL (ref 4.0–11.0)

## 2020-03-25 LAB — SURGICAL PATHOLOGY SPECIMEN

## 2020-03-25 LAB — RENAL FUNCTION PANEL
ALBUMIN: 2.9 g/dL — ABNORMAL LOW (ref 3.5–5.0)
ANION GAP: 7 mmol/L (ref 4–13)
BUN/CREA RATIO: 23 — ABNORMAL HIGH (ref 6–22)
BUN: 64 mg/dL — ABNORMAL HIGH (ref 8–25)
CALCIUM: 9.2 mg/dL (ref 8.5–10.0)
CHLORIDE: 109 mmol/L (ref 96–111)
CO2 TOTAL: 22 mmol/L (ref 22–30)
CREATININE: 2.77 mg/dL — ABNORMAL HIGH (ref 0.60–1.05)
ESTIMATED GFR: 19 mL/min/BSA — ABNORMAL LOW (ref >=60)
GLUCOSE: 208 mg/dL — ABNORMAL HIGH (ref 65–125)
PHOSPHORUS: 5.8 mg/dL — ABNORMAL HIGH (ref 2.4–4.7)
POTASSIUM: 5.6 mmol/L — ABNORMAL HIGH (ref 3.5–5.1)
SODIUM: 138 mmol/L (ref 136–145)

## 2020-03-25 LAB — POC FINGERSTICK GLUCOSE - BMC/JMC (RESULTS)
GLUCOSE, POC: 259 mg/dL — ABNORMAL HIGH (ref 60–100)
GLUCOSE, POC: 240 mg/dL — ABNORMAL HIGH (ref 60–100)
GLUCOSE, POC: 230 mg/dL — ABNORMAL HIGH (ref 60–100)
GLUCOSE, POC: 198 mg/dL — ABNORMAL HIGH (ref 60–100)

## 2020-03-25 LAB — MAGNESIUM: MAGNESIUM: 2.2 mg/dL (ref 1.8–2.6)

## 2020-03-25 MED ORDER — SIMETHICONE 80 MG CHEWABLE TABLET
80.00 mg | CHEWABLE_TABLET | ORAL | Status: DC
Start: 2020-03-26 — End: 2020-03-25

## 2020-03-25 MED ORDER — LACTATED RINGERS INTRAVENOUS SOLUTION
INTRAVENOUS | Status: DC
Start: 2020-03-25 — End: 2020-03-25

## 2020-03-25 MED ORDER — SODIUM ZIRCONIUM CYCLOSILICATE 10 GRAM ORAL POWDER PACKET
10.00 g | Freq: Three times a day (TID) | ORAL | Status: DC
Start: 2020-03-25 — End: 2020-03-26
  Administered 2020-03-25 – 2020-03-26 (×4): 10 g via ORAL
  Filled 2020-03-25 (×5): qty 1

## 2020-03-25 MED ORDER — FAMOTIDINE 20 MG TABLET
20.00 mg | ORAL_TABLET | Freq: Every day | ORAL | Status: DC
Start: 2020-03-25 — End: 2020-03-29
  Administered 2020-03-25 – 2020-03-29 (×5): 20 mg via ORAL
  Filled 2020-03-25 (×5): qty 1

## 2020-03-25 MED ORDER — SODIUM CHLORIDE 0.9 % INTRAVENOUS SOLUTION
INTRAVENOUS | Status: DC
Start: 2020-03-25 — End: 2020-03-27
  Administered 2020-03-26: 0 mL via INTRAVENOUS

## 2020-03-25 MED ORDER — AMLODIPINE 5 MG TABLET
5.00 mg | ORAL_TABLET | Freq: Every day | ORAL | Status: DC
Start: 2020-03-25 — End: 2020-03-26
  Administered 2020-03-25: 5 mg via ORAL
  Filled 2020-03-25: qty 1

## 2020-03-25 NOTE — Progress Notes (Signed)
[facility]  [place], [place] [address]    IP PROGRESS NOTE    Jobe Igo, FNP-C      Rhae Hammock  Date of Admission:  03/24/2020  Date of Birth:  1965/10/09  Date of Service:  03/25/2020      Admission Diagnosis: Clear cell carcinoma of left kidney (CMS [facility])    Brief history of current situation:     6/30 - admitted for elective left radical nephrectomy due to renal mass/renal cell cancer    Chief Complaint: post-op pain as expected    Subjective: sleeping but arouses easily; above noted; reports adequate control    Assessment/ Plan:     HTN: Well controlled. Cont Amlodipine Cozaar    DM: Hold home Po meds. Will order Sliding scale    S/p L nephrectomy. Mngmt per Dr. Kerin Perna    CKD III/hyperkalemia, acute: Dr. Judeth Horn following      Overall PLAN  Medical problems under fairly good control  Discharge will be at the discretion of admitting provider      Vital Signs:  Temp (24hrs) Max:37.2 C (98.9 F)      Temperature: 36.9 C (98.4 F)  BP (Non-Invasive): 126/71  MAP (Non-Invasive): 82 mmHG  Heart Rate: 100  Respiratory Rate: 18  Pain Score (READ ONLY): 8  SpO2: 92 %    Current Medications:  [Held by provider] amLODIPine (NORVASC) tablet, 5 mg, Oral, Daily  atorvastatin (LIPITOR) tablet, 20 mg, Oral, QPM  dapagliflozin (FARXIGA) tablet, 5 mg, Oral, Daily  escitalopram (LEXAPRO) tablet, 10 mg, Oral, Daily  famotidine (PEPCID) tablet, 20 mg, Oral, Daily  losartan (COZAAR) tablet, 50 mg, Oral, Daily  [Held by provider] metFORMIN (GLUCOPHAGE) tablet, 500 mg, Oral, Daily with Breakfast  morphine 4 mg/mL injection, 2 mg, Intravenous, Q4H PRN  NS flush syringe, 10 mL, Intravenous, Q8H  NS flush syringe, 10 mL, Intravenous, Q1H PRN  oxyCODONE-acetaminophen (PERCOCET) 5-325mg  per tablet, 2 Tablet, Oral, Q4H PRN  oxyCODONE-acetaminophen (PERCOCET) 5-325mg  per tablet, 1 Tablet, Oral, Q4H PRN  prochlorperazine (COMPAZINE) 5 mg/mL injection, 10 mg, Intravenous, Q6H PRN  sodium zirconium  cyclosilicate (LOKELMA) powder, 10 g, Oral, 3x/day  SSIP insulin R human 100 units/mL injection, 1-12 Units, Subcutaneous, 4x/day AC  tiotropium bromide (SPIRIVA RESPIMAT) 2.5 mcg per inhalation oral inhaler - "Respiratory to administer", 2 Puff, Inhalation, Daily        Today's Physical Exam:   General: appears morbidly obese and acutely ill but in no acute distress.   Eyes: Pupils equal and round  HENT: Head atraumatic and normocephalic   Neck: No JVD or thyromegaly or lymphadenopathy   Lungs: CTAB, non labored breathing, no rales or wheezing.    Cardiovascular: regular rate and rhythm, S1, S2 normal, no murmur   Abdomen: morbidly obese, soft, surgically tender as expected; dry dressing to left abdomen, bowel sounds normal   Extremities: extremities normal, atraumatic, no cyanosis or edema   Skin: Skin warm and dry   Neurologic: Grossly normal   Psychiatric: Normal affect, behavior     I/O:  I/O last 24 hours:      Intake/Output Summary (Last 24 hours) at 03/25/2020 1317  Last data filed at 03/25/2020 1057  Gross per 24 hour   Intake 995 ml   Output 500 ml   Net 495 ml     I/O current shift:  07/01 0700 - 07/01 1859  In: 160 [P.O.:140; I.V.:20]  Out: 200 [Urine:200]      Labs  Please indicate  ordered or reviewed)  I have reviewed all labs for the past 24 hours; see Epic for full results        Radiology Tests (Please indicate ordered or reviewed)  Interpreted by radiologist and individually reviewed by me:     See Epic        Problem List:  Active Hospital Problems   (*Primary Problem)    Diagnosis   . Clear cell carcinoma of left kidney (CMS [facility])     S/p left laparoscopic partial nephrectomy 10/24/2017             Disposition:   Discharge will be pending test results, need for further testing, or further complications.    Jobe Igo, FNP-C

## 2020-03-25 NOTE — Progress Notes (Signed)
Urology Progress note         [name] is a 54 y.o. female postoperative day 1. Status post laparoscopic left radical nephrectomy for recurrence clear cell renal cell carcinoma following a partial nephrectomy in January 2019. Patient has been ambulating and tolerating some oral intake without nausea.  Pain is controlled with medications.  Appreciate hospitalist and Nephrology consultations.    Pathology is finalized today and of four to the patient has rhabdoid features to have clear cell carcinoma which has a worse prognosis.  We will consider adjuvant therapy when she has recovered from surgery.      BP (!) 98/45   Pulse (!) 108   Temp 36.9 C (98.5 F)   Resp 17   Ht 1.702 m (5\' 7" )   Wt 129 kg (285 lb)   LMP 10/24/2014 (Approximate)   SpO2 96%   BMI 44.64 kg/m        PE:  Incisions are clean, dry, and intact.    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    Postop day 1. Status post left radical nephrectomy:  Doing well and making appropriate recovery.  Continue ambulation, incentive spirometry, and pain control.  Appreciate medical management by hospitalist and recommendations from Nephrology, Dr. Judeth Horn.    Disposition:  The patient continues to make appropriate improvement, she may be ready for discharge from a surgical standpoint by tomorrow afternoon.  However discharge is also dependent on her medical stability.        Ruta Hinds, MD

## 2020-03-25 NOTE — Consults (Signed)
PATIENT NAME: [name], [name]   HOSPITAL NUMBER:  [id number]  DATE OF SERVICE: 03/25/2020  DATE OF BIRTH:  01/09/1966    CONSULTATION    NEPHROLOGY CONSULTATION    REFERRING PROVIDER:  Ruta Hinds, MD.    REASON FOR CONSULTATION:  Acute on chronic kidney disease.    HISTORY OF PRESENT ILLNESS:  Ms. Wires is a 54 year old female who I have followed before in clinic.  She has diabetes mellitus type 2, obesity, hypertension, and in 2019, underwent a partial left nephrectomy for clear cell renal cell carcinoma.  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 Last fall she had COVID and since then her taste has changed and not fully recovered. During her monitoring she was found to have a recurrence of a left kidney mass. Biopsy showed a recurrent clear cell renal cell carcinoma.  Thus prompting the radical left nephrectomy done by Dr. Kerin Perna yesterday.  Her labs yesterday prior to surgery showed a BUN and creatinine of 61, 1.76 with a GFR of 33 mL/minute.  The patient reports losing approximately 7 pounds with dietary and medication changes.  She has been on metformin and Jardiance and is off glipizide which she had taken for years in.  She has some postoperative pain, but otherwise is feeling well.    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    PAST SURGICAL HISTORY:  Partial nephrectomy in 2019, radical left nephrectomy on March 24, 2020.    FAMILY HISTORY:  Positive for diabetes.    MEDICATIONS:  See EMR.    ALLERGIES:  See EMR.    REVIEW OF SYSTEMS:  Postoperative pain currently without chest pain, shortness of breath, right flank pain, gross hematuria, or significant edema.    PHYSICAL EXAMINATION:  General:  She is a pleasant,  well-appearing female.   Vitals: Temperature 36.9 degrees Celsius, pulse 88, blood pressure 127/80, pulse ox is 99%,  weight 129 kg.   HEENT: Unremarkable.    Lungs:  Clear without inspiratory crackles or expiratory wheezes.    Heart:  Regular. No pericardial friction rub. Abdomen: Bowel sounds present.    Skin:  Warm and dry.  No significant edema.    LABORATORY DATA:  Hemoglobin 10.6, sodium 138, potassium 5.6, chloride 109, CO2 22, BUN 64, creatinine 2.77, estimated GFR 19 mL/minute, glucose 208, calcium 9.2, phosphorus 5.8.  Urinalysis in the past showed significant proteinuria.    IMPRESSION:  1. Recurrent renal cell carcinoma with the  clear cell type of the left kidney status post radical nephrectomy yesterday.  2. 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  Anatomic evaluations this year with renal ultrasound, CT and MRI show a large normal right kidney.  However, there have been no baseline renal scans with split function to date.  3. Acute renal failure with serum creatinine rising much more than I would expect after her nephrectomy yesterday.  This may be due to stress of surgery, transient hypotension or other reasons.  She has not been on any nephrotoxic medications.  4. Diabetes mellitus type 2 with obesity, diabetic nephropathy, at high risk for progression to end-stage renal disease.  5. Hypertension.  6. Hyperkalemia, mild, probably due to affects of lactated Ringer's in anesthesia and diet.  7. Secondary hyperparathyroidism  with elevated serum phosphorus.    RECOMMENDATIONS:  I discontinued the lactated Ringer's, IV fluid due to the potassium content of lactated Ringer's.  I have modified the diet to include low potassium and low phosphorus.  I ordered Lokelma 10 grams p.o. t.i.d. until the serum phosphorus is controlled.  I would prefer that she continue both losartan and the SGLT-2  inhibitor for now for renal protection.  If blood pressure declines, then reduce or hold  amlodipine and if IV fluids are required, would use normal saline.  Avoid diuretics for now.  I held her metformin.  She is trying to lose weight so any new diabetic medication should enhance weight loss and not gain.  I may add Tradjenta, which would be a potentially good  medication in this setting.  Avoid any and all potential nephrotoxins. May use famotidine at appropriately dose reduced for her kidney function. For GI prophylaxis would avoid PPIs.  The serum creatinine and EGFR over the next few days will determine her new baseline renal function.  I would expect that she will be in the stage IV range.  She already has an appointment to see me in clinic during the first week of August and I have encouraged her to come to that scheduled appointment and obtain the labs that I ordered a few days before.     Thank you for consulting me on this patient.        Marijo Sanes, MD              CC:   Ruta Hinds, MD       DD:  03/25/2020 11:44:43  DT:  03/25/2020 14:06:10 BW  D#:  [id number]

## 2020-03-25 NOTE — Anesthesia Postprocedure Evaluation (Signed)
Anesthesia Post Op Evaluation    Patient: [name]  Procedure(s):  NEPHRECTOMY RADICAL LAPAROSCOPIC HAND ASSISTED    Last Vitals:Temperature: 37.2 C (98.9 F) (03/25/20 0722)  Heart Rate: (!) 108 (03/25/20 0722)  BP (Non-Invasive): (!) 126/57 (03/25/20 0931)  Respiratory Rate: 20 (03/25/20 0722)  SpO2: 92 % (03/25/20 1216)    No complications documented.    Patient is sufficiently recovered from the effects of anesthesia to participate in the evaluation and has returned to their pre-procedure level.  Patient location during evaluation: floor       Patient participation: complete - patient participated  Level of consciousness: awake and alert and responsive to verbal stimuli    Pain management: adequate  Airway patency: patent    Anesthetic complications: no  Cardiovascular status: acceptable  Respiratory status: acceptable  Hydration status: acceptable  Patient post-procedure temperature: Pt Normothermic   PONV Status: Absent

## 2020-03-25 NOTE — Nurses Notes (Signed)
Documentation by Tama Gander, Nurse Extern reviewed and agreed with by this RN.

## 2020-03-25 NOTE — Care Plan (Signed)
Filed Vitals:    03/25/20 0921 03/25/20 1122 03/25/20 1606 03/25/20 1757   BP:  126/71 (!) 98/45 (!) 113/59   Pulse:  100 (!) 108    Resp:  18 17    Temp:  36.9 C (98.4 F) 36.9 C (98.5 F)    SpO2: 99% 92% 96%      Pt up with stand by assist to chair and in room to bathroom. Up ad lib, activity encouraged. Pain controlled with percocet per MAR and 1x dose IV morphine. Taking PO fluids well. No nausea reported. Incisions WDL with some localized bruising around sites, unchanged during my shift. Voiding well. Norvasc held as of 1146 by Dr. Judeth Horn. Metformin held by Dr. Judeth Horn.  Lokelma powder given x2 doses for hyperkalemia.  FSBG 259, 240, and 230, insulin given per MAR.  Roxicodone given x1 last dose at 1324.  IV morphine given x1 last dose at 0849.    Handoff report to be given to oncoming RN at 1900 shift change.     Problem: Adult Inpatient Plan of Care  Goal: Plan of Care Review  03/25/2020 1641 by Despina Hidden, RN  Outcome: Ongoing (see interventions/notes)  Flowsheets  Taken 03/25/2020 1641 by Despina Hidden, RN  Progress: improving  Taken 03/25/2020 1128 by Sharlee Blew, [place] of Care Reviewed With: patient  03/25/2020 1638 by Despina Hidden, RN  Outcome: Ongoing (see interventions/notes)  Goal: Patient-Specific Goal (Individualized)  03/25/2020 1641 by Despina Hidden, RN  Outcome: Ongoing (see interventions/notes)  03/25/2020 1638 by Despina Hidden, RN  Outcome: Ongoing (see interventions/notes)  Goal: Absence of Hospital-Acquired Illness or Injury  03/25/2020 1641 by Despina Hidden, RN  Outcome: Ongoing (see interventions/notes)  03/25/2020 1638 by Despina Hidden, RN  Outcome: Ongoing (see interventions/notes)  Intervention: Identify and Manage Fall Risk  Flowsheets (Taken 03/25/2020 0853)  Safety Promotion/Fall Prevention:  . fall prevention program maintained  . nonskid shoes/slippers when out of bed  . safety round/check completed  Intervention: Prevent Skin Injury  Flowsheets (Taken 03/25/2020  0853)  Body Position: positioned/repositioned independently  Intervention: Prevent Infection  Flowsheets (Taken 03/25/2020 0853)  Infection Prevention:  . barrier precautions utilized  . environmental surveillance performed  . equipment surfaces disinfected  . personal protective equipment utilized  . promote handwashing  . rest/sleep promoted  . visitors restricted/screened  . single patient room provided  Goal: Optimal Comfort and Wellbeing  03/25/2020 1641 by Despina Hidden, RN  Outcome: Ongoing (see interventions/notes)  03/25/2020 1638 by Despina Hidden, RN  Outcome: Ongoing (see interventions/notes)  Intervention: Provide [place] (Taken 03/25/2020 0853)  Trust Relationship/Rapport:  . care explained  . choices provided  . questions answered  . questions encouraged  Goal: Rounds/Family Conference  03/25/2020 1641 by Despina Hidden, RN  Outcome: Ongoing (see interventions/notes)  Flowsheets (Taken 03/25/2020 1641)  Participants:  . patient  . nursing  03/25/2020 1638 by Despina Hidden, RN  Outcome: Ongoing (see interventions/notes)     Problem: Fall Injury Risk  Goal: Absence of Fall and Fall-Related Injury  03/25/2020 1641 by Despina Hidden, RN  Outcome: Ongoing (see interventions/notes)  03/25/2020 1638 by Despina Hidden, RN  Outcome: Ongoing (see interventions/notes)  Intervention: Identify and Manage Contributors  Flowsheets  Taken 03/25/2020 1638  Medication Review/Management:  . medications reviewed  . provider consulted  Taken 03/25/2020 0853  Self-Care Promotion:  . independence encouraged  . BADL personal objects within reach  . BADL personal routines maintained  Intervention: Promote Copywriter, advertising (  Taken 03/25/2020 0853)  Safety Promotion/Fall Prevention:  . fall prevention program maintained  . nonskid shoes/slippers when out of bed  . safety round/check completed     Problem: Bleeding (Surgery Nonspecified)  Goal: Absence of Bleeding  03/25/2020 1641 by Despina Hidden, RN   Outcome: Ongoing (see interventions/notes)  03/25/2020 1638 by Despina Hidden, RN  Outcome: Ongoing (see interventions/notes)  Intervention: Monitor and Manage Bleeding  Flowsheets (Taken 03/25/2020 1641)  Bleeding Management: dressing monitored     Problem: Bowel Motility Impaired (Surgery Nonspecified)  Goal: Effective Bowel Elimination  03/25/2020 1641 by Despina Hidden, RN  Outcome: Ongoing (see interventions/notes)  03/25/2020 1638 by Despina Hidden, RN  Outcome: Ongoing (see interventions/notes)  Intervention: Enhance Bowel Motility and Elimination  Flowsheets (Taken 03/25/2020 1641)  Bowel Motility Enhancement:  . ambulation promoted  . fluid intake encouraged  . oral intake encouraged     Problem: Infection (Surgery Nonspecified)  Goal: Absence of Infection Signs and Symptoms  03/25/2020 1641 by Despina Hidden, RN  Outcome: Ongoing (see interventions/notes)  03/25/2020 1638 by Despina Hidden, RN  Outcome: Ongoing (see interventions/notes)  Intervention: Prevent or Manage Infection  Flowsheets  Taken 03/25/2020 1641  Infection Management: aseptic technique maintained  Taken 03/25/2020 0853  Fever Reduction/Comfort Measures:  . lightweight bedding  . lightweight clothing     Problem: Ongoing Anesthesia Effects (Surgery Nonspecified)  Goal: Anesthesia/Sedation Recovery  03/25/2020 1641 by Despina Hidden, RN  Outcome: Ongoing (see interventions/notes)  03/25/2020 1638 by Despina Hidden, RN  Outcome: Ongoing (see interventions/notes)  Intervention: Optimize Anesthesia Recovery  Flowsheets  Taken 03/25/2020 1641  Administration (IS): self-administered  Reorientation Measures:  . calendar in view  . clock in view  . familiar social contact encouraged  Outcome Anesthesia/Sedation Recovery: progressing toward baseline  Taken 03/25/2020 1500  $ Level Of Assistance: Independent  Taken 03/25/2020 0853  Safety Promotion/Fall Prevention:  . fall prevention program maintained  . nonskid shoes/slippers when out of bed  . safety round/check  completed     Problem: Pain (Surgery Nonspecified)  Goal: Acceptable Pain Control  03/25/2020 1641 by Despina Hidden, RN  Outcome: Ongoing (see interventions/notes)  03/25/2020 1638 by Despina Hidden, RN  Outcome: Ongoing (see interventions/notes)  Intervention: Prevent or Manage Pain  Flowsheets  Taken 03/25/2020 1641  Pain Management Interventions:  . care clustered  . pain management plan reviewed with patient/caregiver  . quiet environment facilitated  Taken 03/25/2020 0853  Diversional Activities:  . television  . smartphone     Problem: Postoperative Nausea and Vomiting (Surgery Nonspecified)  Goal: Nausea and Vomiting Relief  03/25/2020 1641 by Despina Hidden, RN  Outcome: Adequate for Discharge  03/25/2020 1638 by Despina Hidden, RN  Outcome: Ongoing (see interventions/notes)     Problem: Postoperative Urinary Retention (Surgery Nonspecified)  Goal: Effective Urinary Elimination  03/25/2020 1641 by Despina Hidden, RN  Outcome: Ongoing (see interventions/notes)  03/25/2020 1638 by Despina Hidden, RN  Outcome: Ongoing (see interventions/notes)     Problem: Adjustment to Illness (Chronic Kidney Disease)  Goal: Optimal Coping with Chronic Illness  03/25/2020 1641 by Despina Hidden, RN  Outcome: Ongoing (see interventions/notes)  03/25/2020 1638 by Despina Hidden, RN  Outcome: Ongoing (see interventions/notes)  Intervention: Support Psychosocial Response (CKD)  Flowsheets  Taken 03/25/2020 1638  Supportive Measures:  . active listening utilized  . decision-making supported  . goal-setting facilitated  . positive reinforcement provided  . self-responsibility promoted  . self-reflection promoted  . self-care encouraged  . verbalization of feelings encouraged  Taken 03/25/2020 [phone]  Family/Support System Care:  . self-care encouraged  . support provided     Problem: Electrolyte Imbalance (Chronic Kidney Disease)  Goal: Electrolyte Balance  03/25/2020 1641 by Despina Hidden, RN  Outcome: Ongoing (see interventions/notes)  03/25/2020  1638 by Despina Hidden, RN  Outcome: Ongoing (see interventions/notes)  Intervention: Monitor and Manage Electrolyte Imbalance  Flowsheets (Taken 03/25/2020 1638)  Fluid/Electrolyte Management:  . electrolyte-binding therapy initiated  . intravenous fluids adjusted     Problem: Fluid Volume Excess (Chronic Kidney Disease)  Goal: Fluid Balance  03/25/2020 1641 by Despina Hidden, RN  Outcome: Ongoing (see interventions/notes)  03/25/2020 1638 by Despina Hidden, RN  Outcome: Ongoing (see interventions/notes)  Intervention: Monitor and Manage Hypervolemia  Flowsheets (Taken 03/25/2020 1638)  Skin Protection:  . adhesive use limited  . transparent dressing maintained  . tubing/devices free from skin contact  Fluid/Electrolyte Management:  . electrolyte-binding therapy initiated  . intravenous fluids adjusted     Problem: Functional Decline (Chronic Kidney Disease)  Goal: Optimal Functional Ability  03/25/2020 1641 by Despina Hidden, RN  Outcome: Ongoing (see interventions/notes)  03/25/2020 1638 by Despina Hidden, RN  Outcome: Ongoing (see interventions/notes)  Intervention: Optimize Functional Ability  Flowsheets  Taken 03/25/2020 1638  Activity Management:  . activity adjusted per tolerance  . activity clustered for rest periods  . activity encouraged  . ambulated in room  . ambulated to bathroom  . up ad lib  . up in chair  . sitting, edge of bed  Environment Familiarity/Consistency: daily routine followed  Taken 03/25/2020 0853  Self-Care Promotion:  . independence encouraged  . BADL personal objects within reach  . BADL personal routines maintained     Problem: Hematologic Alteration (Chronic Kidney Disease)  Goal: Absence of Anemia Signs/Symptoms  03/25/2020 1641 by Despina Hidden, RN  Outcome: Ongoing (see interventions/notes)  03/25/2020 1638 by Despina Hidden, RN  Outcome: Ongoing (see interventions/notes)  Intervention: Manage Signs of Anemia and Bleeding  Flowsheets (Taken 03/25/2020 1638)  Bleeding Precautions: blood  pressure closely monitored  Environmental Support:  . calm environment promoted  . caregiver consistency promoted  . distractions minimized  . rest periods encouraged  . personal routine supported  . environmental consistency promoted     Problem: Oral Intake Inadequate (Chronic Kidney Disease)  Goal: Optimal Oral Intake  03/25/2020 1641 by Despina Hidden, RN  Outcome: Ongoing (see interventions/notes)  03/25/2020 1638 by Despina Hidden, RN  Outcome: Ongoing (see interventions/notes)  Intervention: Promote and Optimize Oral Intake  Flowsheets (Taken 03/25/2020 1638)  Oral Nutrition Promotion:  . physical activity promoted  . rest periods promoted  . social interaction promoted     Problem: Renal Function Impairment (Chronic Kidney Disease)  Goal: Laboratory Values and Blood Pressure Within Desired Range  03/25/2020 1641 by Despina Hidden, RN  Outcome: Ongoing (see interventions/notes)  03/25/2020 1638 by Despina Hidden, RN  Outcome: Ongoing (see interventions/notes)  Intervention: Monitor and Support Renal Function  Flowsheets (Taken 03/25/2020 1638)  Medication Review/Management:  . medications reviewed  . provider consulted

## 2020-03-25 NOTE — Nurses Notes (Signed)
IV SL per Dr. Areta Haber order and lokelma given per [facility]. Was brought to unit 0900 pharmacy rounds so given late.

## 2020-03-25 NOTE — Care Plan (Signed)
Problem: Adult Inpatient Plan of Care  Goal: Plan of Care Review  Outcome: Ongoing (see interventions/notes)  Goal: Patient-Specific Goal (Individualized)  Outcome: Ongoing (see interventions/notes)  Goal: Absence of Hospital-Acquired Illness or Injury  Outcome: Ongoing (see interventions/notes)  Intervention: Identify and Manage Fall Risk  Flowsheets (Taken 03/25/2020 0100)  Safety Promotion/Fall Prevention:   fall prevention program maintained   nonskid shoes/slippers when out of bed   safety round/check completed  Intervention: Prevent Skin Injury  Flowsheets (Taken 03/25/2020 0100)  Body Position:   positioned/repositioned independently   supine, head elevated  Intervention: Prevent and Manage VTE (Venous Thromboembolism) Risk  Flowsheets (Taken 03/25/2020 0100)  VTE Prevention/Management:   ambulation promoted   sequential compression devices on  Intervention: Prevent Infection  Flowsheets (Taken 03/25/2020 0100)  Infection Prevention:   barrier precautions utilized   equipment surfaces disinfected   glycemic control managed   personal protective equipment utilized   promote handwashing   rest/sleep promoted   visitors restricted/screened   single patient room provided  Note: Patient encouraged to wash hands frequently to decrease the spread of germs.    Goal: Optimal Comfort and Wellbeing  Outcome: Ongoing (see interventions/notes)  Intervention: Provide Person-Centered Care  Flowsheets (Taken 03/25/2020 0100)  Trust Relationship/Rapport:   care explained   choices provided   emotional support provided   empathic listening provided   questions answered   questions encouraged   reassurance provided   thoughts/feelings acknowledged  Goal: Rounds/Family Conference  Outcome: Ongoing (see interventions/notes)     Problem: Fall Injury Risk  Goal: Absence of Fall and Fall-Related Injury  Outcome: Ongoing (see interventions/notes)  Intervention: Identify and Manage Contributors  Flowsheets (Taken 03/25/2020 0100)  Self-Care  Promotion:   independence encouraged   BADL personal objects within reach   BADL personal routines maintained  Medication Review/Management: medications reviewed  Intervention: Promote Injury-Free Environment  Flowsheets (Taken 03/25/2020 0100)  Safety Promotion/Fall Prevention:   fall prevention program maintained   nonskid shoes/slippers when out of bed   safety round/check completed

## 2020-03-25 NOTE — Nurses Notes (Signed)
BP meds given per MAR.

## 2020-03-25 NOTE — Progress Notes (Signed)
Consult Dictated:  Impressions:  1. History of RCCA  S/p partial left nephrectomy in 2019.  2. 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  3. 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 Anatomic evaluations with Renal US, CT, and MRI showed large-normal right kidney. No renal scans with split function were done in past.  4. ARF with Scr rising much more than expected after surgery yesterday.   5. 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   6. HTN.   7. Hyperkalemia, mild  8. Secondary hyperparathyroidism with elevated serum phosphorus.  Recommendations:  1. I discontinued LR due to potassium content.   2. I modified diet to include low potassium and phosphorus  3. I ordered Lokelma 10 gm tid until potassium controlled.  4. I do want her to continue both Losartan and SGLT-2 inhibitor for now for renal protection. If BP declines, then reduce or hold amlodipine. Avoid diuretics for now. I held her metformin. She is trying to lose weight, so any new diabetic medications should enhance weight loss and not gain. Could use Tradjenta.   5. Avoid any and all potential nephrotoxins. May use Famotidine for GI prophylaxis.   6. Scr/eGFR over next few days will determine her new baseline renal function.   7. She already has an appointment to see me in my clinic the first week of August.

## 2020-03-25 NOTE — Care Management Notes (Signed)
03/25/20 1100   Assessment Detail   Assessment Type Admission   Date of Care Management Update 03/25/20   Social Work Plan   Discharge Planning Status initial meeting   Anticipated Discharge Disposition Home   Discharge Needs Assessment   Equipment Currently Used at Home none   Transportation Available car   Referral Information   Admission Type inpatient   Arrived From home or self-care   Address Verified verified-no changes   Insurance Verified verified-no change   Source of Information Patient   Living Environment   Lives With spouse;child(ren), adult   Living Arrangements house     Met with patient at bedside to discuss discharge planning / needs.  Patient and husband reside in a single level house.  Their three adult children reside with them.  Patient is independent, drives, and has adequate transportation. She denies DME and obtains prescription medication from [facility].  Please place Care Management consult for any anticipated needs.

## 2020-03-25 NOTE — Nurses Notes (Signed)
Message sent to Jobe Igo, APRN to inform her of patient AM BP and ask if BP medications should be given. Per L. Baker, APRN, request, this nurse to give BP meds as ordered.

## 2020-03-25 NOTE — Nurses Notes (Signed)
Patient resting in bed at this time. Patient ambulated to bathroom a 0630 and voided 300cc of pale yellow urine. Vital signs were obtained and charted as stable. IV infusing to right wrist patent and infusing as ordered. IV rate was adjusted as ordered. BP medication was not given early.

## 2020-03-25 NOTE — Care Plan (Signed)
Problem: Adult Inpatient Plan of Care  Goal: Plan of Care Review  Flowsheets (Taken 03/25/2020 1128)  Plan of Care Reviewed With: patient  Note: Discharge planning at bedside.  No needs noted.  CM will follow for consults.

## 2020-03-26 LAB — CBC WITH DIFF
BASOPHIL #: 0 10*3/uL (ref 0.00–0.10)
BASOPHIL %: 0 % (ref 0–3)
EOSINOPHIL #: 0 10*3/uL (ref 0.00–0.50)
EOSINOPHIL %: 0 % (ref 0–5)
HCT: 29 % — ABNORMAL LOW (ref 36.0–45.0)
HGB: 9.7 g/dL — ABNORMAL LOW (ref 12.0–15.5)
LYMPHOCYTE #: 1.2 10*3/uL (ref 1.00–4.80)
LYMPHOCYTE %: 15 % (ref 15–43)
MCH: 29.6 pg (ref 27.5–33.2)
MCHC: 33.4 g/dL (ref 32.0–36.0)
MCV: 88.6 fL (ref 82.0–97.0)
MONOCYTE #: 0.7 10*3/uL (ref 0.20–0.90)
MONOCYTE %: 9 % (ref 5–12)
MPV: 8.5 fL (ref 7.4–10.5)
NEUTROPHIL #: 6.4 10*3/uL (ref 1.50–6.50)
NEUTROPHIL %: 76 % (ref 43–76)
PLATELETS: 170 10*3/uL (ref 150–450)
RBC: 3.27 10*6/uL — ABNORMAL LOW (ref 4.00–5.10)
RDW: 14.6 % (ref 11.0–16.0)
WBC: 8.5 10*3/uL (ref 4.0–11.0)

## 2020-03-26 LAB — RENAL FUNCTION PANEL
ALBUMIN: 2.7 g/dL — ABNORMAL LOW (ref 3.5–5.0)
ANION GAP: 9 mmol/L (ref 4–13)
BUN/CREA RATIO: 20 (ref 6–22)
BUN: 61 mg/dL — ABNORMAL HIGH (ref 8–25)
CALCIUM: 9.1 mg/dL (ref 8.5–10.0)
CHLORIDE: 103 mmol/L (ref 96–111)
CO2 TOTAL: 18 mmol/L — ABNORMAL LOW (ref 22–30)
CREATININE: 3.12 mg/dL — ABNORMAL HIGH (ref 0.60–1.05)
ESTIMATED GFR: 17 mL/min/BSA — ABNORMAL LOW (ref >=60)
GLUCOSE: 210 mg/dL — ABNORMAL HIGH (ref 65–125)
PHOSPHORUS: 4.3 mg/dL (ref 2.4–4.7)
POTASSIUM: 4.6 mmol/L (ref 3.5–5.1)
SODIUM: 130 mmol/L — ABNORMAL LOW (ref 136–145)

## 2020-03-26 LAB — POC FINGERSTICK GLUCOSE - BMC/JMC (RESULTS)
GLUCOSE, POC: 213 mg/dL — ABNORMAL HIGH (ref 60–100)
GLUCOSE, POC: 220 mg/dL — ABNORMAL HIGH (ref 60–100)
GLUCOSE, POC: 171 mg/dL — ABNORMAL HIGH (ref 60–100)
GLUCOSE, POC: 155 mg/dL — ABNORMAL HIGH (ref 60–100)

## 2020-03-26 MED ORDER — LINAGLIPTIN 5 MG TABLET
5.00 mg | ORAL_TABLET | Freq: Every day | ORAL | Status: DC
Start: 2020-03-26 — End: 2020-03-29
  Administered 2020-03-26 – 2020-03-29 (×4): 5 mg via ORAL
  Filled 2020-03-26 (×6): qty 1

## 2020-03-26 MED ORDER — LIDOCAINE 3.6 %-MENTHOL 1.25 % TOPICAL PATCH
2.00 | MEDICATED_PATCH | Freq: Every day | CUTANEOUS | Status: DC
Start: 2020-03-26 — End: 2020-03-29
  Administered 2020-03-26: 0 via TRANSDERMAL
  Administered 2020-03-27 – 2020-03-29 (×3): 2 via TRANSDERMAL
  Filled 2020-03-26 (×6): qty 2

## 2020-03-26 MED ORDER — SODIUM BICARBONATE 650 MG TABLET
650.00 mg | ORAL_TABLET | Freq: Four times a day (QID) | ORAL | Status: AC
Start: 2020-03-26 — End: 2020-03-26
  Administered 2020-03-26 (×2): 650 mg via ORAL
  Filled 2020-03-26 (×2): qty 1

## 2020-03-26 MED ORDER — LOSARTAN 25 MG TABLET
25.00 mg | ORAL_TABLET | Freq: Every day | ORAL | Status: DC
Start: 2020-03-26 — End: 2020-03-29
  Administered 2020-03-26 – 2020-03-29 (×4): 25 mg via ORAL
  Filled 2020-03-26 (×6): qty 1

## 2020-03-26 MED ORDER — INSULIN REGULAR HUMAN 100 UNIT/ML INJECTION SSIP - CITY
1.00 [IU] | Freq: Four times a day (QID) | INTRAMUSCULAR | Status: DC
Start: 2020-03-26 — End: 2020-03-29
  Administered 2020-03-26 – 2020-03-27 (×3): 2 [IU] via SUBCUTANEOUS
  Administered 2020-03-27: 4 [IU] via SUBCUTANEOUS
  Administered 2020-03-27: 2 [IU] via SUBCUTANEOUS
  Administered 2020-03-27: 0 [IU] via SUBCUTANEOUS
  Administered 2020-03-28 (×4): 2 [IU] via SUBCUTANEOUS
  Administered 2020-03-29: 0 [IU] via SUBCUTANEOUS

## 2020-03-26 NOTE — Nurses Notes (Signed)
Patient returned to bed. SCDs on. PRN percocet given per [name] for 7/10 pain. Reports relief of nausea after compazine given by previous RN, Gwenette Greet.

## 2020-03-26 NOTE — Care Management Notes (Signed)
Patient has potential discharge; please place Care Management consult if any needs develop prior to discharge.

## 2020-03-26 NOTE — Progress Notes (Signed)
[facility]  Nephrology Consult Follow Up Note      Date of service: 03/26/2020  Hospital Day:  LOS: 2 days   Encounter Start Date: 03/24/2020  Inpatient Admission Date: 03/24/2020    SUBJECTIVE: Still feeling a little rough (like I've been through the wringer). Making adequate urine. No low BP overnight.     OBJECTIVE:    Vital Signs:  Temp (24hrs) Max:37.6 C (99.7 F)      Temperature: 37.4 C (99.4 F)  BP (Non-Invasive): (!) 143/55  MAP (Non-Invasive): 76 mmHG  Heart Rate: (!) 113  Respiratory Rate: 18  Pain Score (READ ONLY): 8  SpO2: 98 %    Systolic (44YJE), HUD:149 , Min:98 , FWY:637     Diastolic (85YIF), OYD:74, Min:45, Max:71    MAP:  MAP (Non-Invasive)  Avg: 87.5 mmHG  Min: 54 mmHG  Max: 119 mmHG  Heart Rate:  Pulse  Avg: 92.4  Min: 79  Max: 113    Dialysis treatment total fluid removal:     Post dialysis weight:       Diet Order:  DIET DIABETIC/ADA Carb Count per meal: 45 gm; 2 gm Potassium, Low phosphorus  Nutrition:    Orders Placed This Encounter   No orders of the following type(s) were placed in this encounter: Nourishments.     IV Fluids, Meds, and Drips: NS, Last Rate: 75 mL/hr at 03/26/20 0830        I/O:  I/O last 24 hours:      Intake/Output Summary (Last 24 hours) at 03/26/2020 0921  Last data filed at 03/26/2020 0835  Gross per 24 hour   Intake 1692 ml   Output 1150 ml   Net 542 ml     I/O current shift:  07/02 0700 - 07/02 1859  In: 380 [P.O.:240; I.V.:140]  Out: 150 [Urine:150]  I/O last 3 completed shifts:  In: 1287 [P.O.:620; I.V.:832]  Out: 1000 [Urine:1000]    Labs:  I have reviewed all lab results.    Imaging:  N/A    Current Medications:  atorvastatin (LIPITOR) tablet, 20 mg, Oral, QPM  dapagliflozin (FARXIGA) tablet, 5 mg, Oral, Daily  escitalopram (LEXAPRO) tablet, 10 mg, Oral, Daily  famotidine (PEPCID) tablet, 20 mg, Oral, Daily  linagliptin (TRADJENTA) tablet, 5 mg, Oral, Daily  losartan (COZAAR) tablet, 25 mg, Oral, Daily  morphine 4 mg/mL injection, 2 mg, Intravenous, Q4H  PRN  NS flush syringe, 10 mL, Intravenous, Q8H  NS flush syringe, 10 mL, Intravenous, Q1H PRN  NS premix infusion, , Intravenous, Continuous  oxyCODONE-acetaminophen (PERCOCET) 5-325mg  per tablet, 2 Tablet, Oral, Q4H PRN  oxyCODONE-acetaminophen (PERCOCET) 5-325mg  per tablet, 1 Tablet, Oral, Q4H PRN  prochlorperazine (COMPAZINE) 5 mg/mL injection, 10 mg, Intravenous, Q6H PRN  sodium bicarbonate tablet, 650 mg, Oral, 4x/day  SSIP insulin R human 100 units/mL injection, 1-12 Units, Subcutaneous, 4x/day AC        Physical Exam:  Constitutional:  moderately obese and no distress  Respiratory:  Clear to auscultation bilaterally.   Cardiovascular:  no rub  Gastrointestinal:  non-distended  Integumentary:  Skin warm and dry  Neurologic:  No asterixis    Impression/Recommendations:  1. 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 Even though Scr is higher than yesterday, the rate of rise is lower so she is showing signs of stabilization.   2. Hyperkalemia and hyperphosphatemia resolved - D/C Lokelma.   3. HTN, controlled.   4. Mild hyponatremia and  normal gap acidosis. Two doses of oral sodium bicarbonate ordered for today.   Disposition: Will defer decision for discharge to Dr. Kerin Perna, but from my perspective you could discharge her if you feel she is ready, but with the following medication adjustments.   - Losartan 25 mg daily as only BP medication. No diuretic or amlodipine.   - Tradjenta 5 mg daily - will need new prescription.   - No Farxiga at discharge, and she will restart Jardiance 10 mg/day when she gets home, then I'll decide at my follow up whether I continue or change it.   - Famotidine 20 mg daily. No Pantoprazole.   If she is not discharged today, Dr. Eugenio Hoes is working for our service this weekend.       Marijo Sanes, MD

## 2020-03-26 NOTE — Care Plan (Signed)
Filed Vitals:    03/26/20 0648 03/26/20 0805 03/26/20 1205 03/26/20 1704   BP: (!) 144/65 (!) 143/55 (!) 94/47 130/64   Pulse: 96 (!) 113 95 98   Resp: _0 Temp: 37.6 C (99.7 F) 37.4 C (99.4 F) 36.7 C (98 F) 37.2 C (98.9 F)   SpO2: 95% 98% 96% 91%     IV NS at 72m/hr.  Vomited x1 this AM. Compazine given at 0712.  Percocet 2 tabs given x2 last dose at 1213.  Started Tradjenta 535mdaily for diabetes.  Metformin held per provider order.  Lokelma given for hyperkalemia x1 pkt.  Sodium bicarb started for hyponatremia.    0744 labs: K 4.6, Na 130, CO2 18, BUN 61, Creat 3.12, EGFR 17, Phos 4.3    FSBG 213, 220, and 171. Insulin given per MAR.      Intake/Output Summary (Last 24 hours) at 03/26/2020 1756  Last data filed at 03/26/2020 1704  Gross per 24 hour   Intake 1792 ml   Output 1000 ml   Net 792 ml           Problem: Adult Inpatient Plan of Care  Goal: Plan of Care Review  Outcome: Ongoing (see interventions/notes)  Goal: Patient-Specific Goal (Individualized)  Outcome: Ongoing (see interventions/notes)  Goal: Absence of Hospital-Acquired Illness or Injury  Outcome: Ongoing (see interventions/notes)  Goal: Optimal Comfort and Wellbeing  Outcome: Ongoing (see interventions/notes)  Goal: Rounds/Family Conference  Outcome: Ongoing (see interventions/notes)     Problem: Fall Injury Risk  Goal: Absence of Fall and Fall-Related Injury  Outcome: Ongoing (see interventions/notes)     Problem: Bleeding (Surgery Nonspecified)  Goal: Absence of Bleeding  Outcome: Ongoing (see interventions/notes)     Problem: Bowel Motility Impaired (Surgery Nonspecified)  Goal: Effective Bowel Elimination  Outcome: Ongoing (see interventions/notes)     Problem: Infection (Surgery Nonspecified)  Goal: Absence of Infection Signs and Symptoms  Outcome: Ongoing (see interventions/notes)     Problem: Ongoing Anesthesia Effects (Surgery Nonspecified)  Goal: Anesthesia/Sedation Recovery  Outcome: Ongoing (see interventions/notes)      Problem: Pain (Surgery Nonspecified)  Goal: Acceptable Pain Control  Outcome: Ongoing (see interventions/notes)     Problem: Postoperative Urinary Retention (Surgery Nonspecified)  Goal: Effective Urinary Elimination  Outcome: Ongoing (see interventions/notes)     Problem: Adjustment to Illness (Chronic Kidney Disease)  Goal: Optimal Coping with Chronic Illness  Outcome: Ongoing (see interventions/notes)     Problem: Electrolyte Imbalance (Chronic Kidney Disease)  Goal: Electrolyte Balance  Outcome: Ongoing (see interventions/notes)     Problem: Fluid Volume Excess (Chronic Kidney Disease)  Goal: Fluid Balance  Outcome: Ongoing (see interventions/notes)     Problem: Functional Decline (Chronic Kidney Disease)  Goal: Optimal Functional Ability  Outcome: Ongoing (see interventions/notes)     Problem: Hematologic Alteration (Chronic Kidney Disease)  Goal: Absence of Anemia Signs/Symptoms  Outcome: Ongoing (see interventions/notes)     Problem: Oral Intake Inadequate (Chronic Kidney Disease)  Goal: Optimal Oral Intake  Outcome: Ongoing (see interventions/notes)     Problem: Renal Function Impairment (Chronic Kidney Disease)  Goal: Laboratory Values and Blood Pressure Within Desired Range  Outcome: Ongoing (see interventions/notes)

## 2020-03-26 NOTE — Nurses Notes (Incomplete)
Documentation by Tama Gander, Nurse Extern reviewed and agreed with by this RN.

## 2020-03-26 NOTE — Nurses Notes (Signed)
Patient vomited approximately 50cc of greenish emesis. Patient was medicated with PRN compazine as ordered. Patient states that she is feeling well. Diet ginger ale was given. Call bell within reach while patient is out of bed. Hand off report given to [facility].

## 2020-03-26 NOTE — Progress Notes (Signed)
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 Painful to abdomen. Incision site is intact and no S/S infection. No chest pain, SOB. Lungs clear to auscultation. HRR. No questions or concerns at this time.     Assessment/ Plan:     HTN: Well controlled. Discontinue Amlodipine and diuretic and agree with Dr. Judeth Horn and keep Losartan 25 mg daily    DM: Hold home Po meds. Will order Sliding scale and increased   Agree with Dr. Judeth Horn to start tradjenta 5 mg daily and no farxiga at discharge and ok to restart Jardiance 10 mg/day    S/p L nephrectomy. Mngmt per Dr. Kerin Perna  Janene Harvey control (added lidocaine patches)  H/H 9.7/29.0 to be expected post surgery.     CKD III/hyperkalemia, acute/mild hyponatremia: Dr. Judeth Horn following  Creatinine 3.12, GFR 17  D/C'd lokelma per Dr. Judeth Horn      Overall PLAN  Medical problems under fairly good control  Discharge will be at the discretion of admitting provider    Vital Signs:  Temp (24hrs) Max:37.6 C (99.7 F)      Temperature: 36.7 C (98 F)  BP (Non-Invasive): (!) 94/47  MAP (Non-Invasive): 59 mmHG  Heart Rate: 95  Respiratory Rate: 17  Pain Score (READ ONLY): 8  SpO2: 96 %    Current Medications:  atorvastatin (LIPITOR) tablet, 20 mg, Oral, QPM  dapagliflozin (FARXIGA) tablet, 5 mg, Oral, Daily  escitalopram (LEXAPRO) tablet, 10 mg, Oral, Daily  famotidine (PEPCID) tablet, 20 mg, Oral, Daily  linagliptin (TRADJENTA) tablet, 5 mg, Oral, Daily  losartan (COZAAR) tablet, 25 mg, Oral, Daily  morphine 4 mg/mL injection, 2 mg,  Intravenous, Q4H PRN  NS flush syringe, 10 mL, Intravenous, Q8H  NS flush syringe, 10 mL, Intravenous, Q1H PRN  NS premix infusion, , Intravenous, Continuous  oxyCODONE-acetaminophen (PERCOCET) 5-335m per tablet, 2 Tablet, Oral, Q4H PRN  oxyCODONE-acetaminophen (PERCOCET) 5-3255mper tablet, 1 Tablet, Oral, Q4H PRN  prochlorperazine (COMPAZINE) 5 mg/mL injection, 10 mg, Intravenous, Q6H PRN  SSIP insulin R human 100 units/mL injection, 1-12 Units, Subcutaneous, 4x/day AC        Today's Physical Exam:   General: appears morbidly obese and acutely ill but in no acute distress.   Eyes: Pupils equal and round  HENT: Head atraumatic and normocephalic   Neck: No JVD  Lungs: CTAB, non labored breathing, no rales or wheezing.    Cardiovascular: regular rate and rhythm,  Abdomen: morbidly obese, soft, surgically tender as expected; incision CDI with dermabond, bowel sounds normal   Extremities: extremities normal, atraumatic, no cyanosis or edema   Skin: Skin warm and dry   Neurologic: Grossly normal   Psychiatric: Normal affect, behavior     I/O:  I/O last 24 hours:      Intake/Output Summary (Last 24 hours) at 03/26/2020 1515  Last data filed at 03/26/2020 1000  Gross per 24 hour   Intake 1792 ml   Output 950 ml   Net 842 ml  I/O current shift:  07/02 0700 - 07/02 1859  In: 500 [P.O.:360; I.V.:140]  Out: 150 [Urine:150]      Labs  Please indicate ordered or reviewed)  I have reviewed all labs for the past 24 hours; see Epic for full results        Radiology Tests (Please indicate ordered or reviewed)  Interpreted by radiologist and individually reviewed by me:     See Epic        Problem List:  Active Hospital Problems   (*Primary Problem)    Diagnosis   . 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    Ozella Rocks, NP

## 2020-03-26 NOTE — Nurses Notes (Signed)
Patient vital signs have remained stable this shift. Patient up to bathroom with stand by assistance. PRN pain medication was given per [facility]. Patient taking po fluids well, however she reports a decrease in appetite. Incision intact and localized bruising noted. IV to right hand patent and infusing as ordered. Scd'd on while patient is in bed. Patient currently sitting in chair. No needs reported.

## 2020-03-26 NOTE — Care Plan (Signed)
Problem: Adult Inpatient Plan of Care  Goal: Plan of Care Review  Outcome: Ongoing (see interventions/notes)  Goal: Patient-Specific Goal (Individualized)  Outcome: Ongoing (see interventions/notes)  Goal: Absence of Hospital-Acquired Illness or Injury  Outcome: Ongoing (see interventions/notes)  Intervention: Identify and Manage Fall Risk  Flowsheets (Taken 03/26/2020 0000)  Safety Promotion/Fall Prevention:   fall prevention program maintained   nonskid shoes/slippers when out of bed   safety round/check completed  Intervention: Prevent Skin Injury  Flowsheets (Taken 03/26/2020 0000)  Body Position:   positioned/repositioned independently   supine, head elevated  Intervention: Prevent and Manage VTE (Venous Thromboembolism) Risk  Flowsheets (Taken 03/26/2020 0000)  VTE Prevention/Management:   ambulation promoted   sequential compression devices on  Intervention: Prevent Infection  Flowsheets (Taken 03/26/2020 0000)  Infection Prevention:   barrier precautions utilized   equipment surfaces disinfected   glycemic control managed   personal protective equipment utilized   promote handwashing   rest/sleep promoted   visitors restricted/screened   single patient room provided  Goal: Optimal Comfort and Wellbeing  Outcome: Ongoing (see interventions/notes)  Intervention: Provide Person-Centered Care  Flowsheets (Taken 03/26/2020 0000)  Trust Relationship/Rapport:   care explained   choices provided   emotional support provided   empathic listening provided   questions answered   questions encouraged   reassurance provided   thoughts/feelings acknowledged  Goal: Rounds/Family Conference  Outcome: Ongoing (see interventions/notes)     Problem: Fall Injury Risk  Goal: Absence of Fall and Fall-Related Injury  Outcome: Ongoing (see interventions/notes)  Intervention: Identify and Manage Contributors  Flowsheets (Taken 03/26/2020 0000)  Self-Care Promotion:   independence encouraged   BADL personal objects within reach   BADL personal  routines maintained   safe use of adaptive equipment encouraged  Medication Review/Management: medications reviewed  Intervention: Promote Injury-Free Environment  Flowsheets (Taken 03/26/2020 0000)  Safety Promotion/Fall Prevention:   fall prevention program maintained   nonskid shoes/slippers when out of bed   safety round/check completed     Problem: Bleeding (Surgery Nonspecified)  Goal: Absence of Bleeding  Outcome: Ongoing (see interventions/notes)  Intervention: Monitor and Manage Bleeding  Flowsheets (Taken 03/26/2020 0536)  Bleeding Management: dressing monitored     Problem: Bowel Motility Impaired (Surgery Nonspecified)  Goal: Effective Bowel Elimination  Outcome: Ongoing (see interventions/notes)  Intervention: Enhance Bowel Motility and Elimination  Flowsheets  Taken 03/26/2020 0536  Bowel Elimination Management: relaxation techniques promoted  Taken 03/26/2020 0000  Bowel Motility Enhancement:   ambulation promoted   fluid intake encouraged   oral intake encouraged     Problem: Infection (Surgery Nonspecified)  Goal: Absence of Infection Signs and Symptoms  Outcome: Ongoing (see interventions/notes)  Intervention: Prevent or Manage Infection  Flowsheets (Taken 03/26/2020 0000)  Fever Reduction/Comfort Measures:   lightweight bedding   lightweight clothing   medication administered  Infection Management: aseptic technique maintained     Problem: Ongoing Anesthesia Effects (Surgery Nonspecified)  Goal: Anesthesia/Sedation Recovery  Outcome: Ongoing (see interventions/notes)  Intervention: Optimize Anesthesia Recovery  Flowsheets (Taken 03/26/2020 0000)  Safety Promotion/Fall Prevention:   fall prevention program maintained   nonskid shoes/slippers when out of bed   safety round/check completed     Problem: Pain (Surgery Nonspecified)  Goal: Acceptable Pain Control  Outcome: Ongoing (see interventions/notes)  Intervention: Prevent or Manage Pain  Flowsheets  Taken 03/26/2020 0536  Pain Management Interventions:   care  clustered   pain management plan reviewed with patient/caregiver   quiet environment facilitated  Taken 03/26/2020 0000  Diversional Activities:   smartphone   television     Problem: Postoperative Urinary Retention (Surgery Nonspecified)  Goal: Effective Urinary Elimination  Outcome: Ongoing (see interventions/notes)  Intervention: Monitor and Manage Urinary Retention  Flowsheets (Taken 03/26/2020 0536)  Urinary Elimination Promotion:   frequent voiding encouraged   voiding relaxation promoted     Problem: Adjustment to Illness (Chronic Kidney Disease)  Goal: Optimal Coping with Chronic Illness  Outcome: Ongoing (see interventions/notes)  Intervention: Support Psychosocial Response (CKD)  Flowsheets (Taken 03/26/2020 0000)  Family/Support System Care:   self-care encouraged   support provided  Supportive Measures:   active listening utilized   decision-making supported   positive reinforcement provided   relaxation techniques promoted   self-responsibility promoted   verbalization of feelings encouraged     Problem: Electrolyte Imbalance (Chronic Kidney Disease)  Goal: Electrolyte Balance  Outcome: Ongoing (see interventions/notes)  Intervention: Monitor and Manage Electrolyte Imbalance  Flowsheets (Taken 03/26/2020 0000)  Fluid/Electrolyte Management: intravenous fluid replacement initiated     Problem: Fluid Volume Excess (Chronic Kidney Disease)  Goal: Fluid Balance  Outcome: Ongoing (see interventions/notes)  Intervention: Monitor and Manage Hypervolemia  Flowsheets (Taken 03/26/2020 0000)  Skin Protection:   adhesive use limited   tubing/devices free from skin contact  Fluid/Electrolyte Management: intravenous fluid replacement initiated     Problem: Functional Decline (Chronic Kidney Disease)  Goal: Optimal Functional Ability  Outcome: Ongoing (see interventions/notes)  Intervention: Optimize Functional Ability  Flowsheets (Taken 03/26/2020 0000)  Self-Care Promotion:   independence encouraged   BADL personal objects  within reach   BADL personal routines maintained   safe use of adaptive equipment encouraged  Activity Management:   activity adjusted per tolerance   activity clustered for rest periods   activity encouraged   ambulated in room   ambulated to bathroom   up ad lib  Environment Familiarity/Consistency:   daily routine followed   familiar objects from home provided   personal clothing/items utilized     Problem: Hematologic Alteration (Chronic Kidney Disease)  Goal: Absence of Anemia Signs/Symptoms  Outcome: Ongoing (see interventions/notes)  Intervention: Manage Signs of Anemia and Bleeding  Flowsheets  Taken 03/26/2020 0536  Bleeding Precautions: blood pressure closely monitored  Taken 03/26/2020 0000  Environmental Support:   calm environment promoted   distractions minimized   personal routine supported   rest periods encouraged     Problem: Oral Intake Inadequate (Chronic Kidney Disease)  Goal: Optimal Oral Intake  Outcome: Ongoing (see interventions/notes)  Intervention: Promote and Optimize Oral Intake  Flowsheets (Taken 03/26/2020 0536)  Oral Nutrition Promotion:   physical activity promoted   rest periods promoted   social interaction promoted     Problem: Renal Function Impairment (Chronic Kidney Disease)  Goal: Laboratory Values and Blood Pressure Within Desired Range  Outcome: Ongoing (see interventions/notes)  Intervention: Monitor and Support Renal Function  Flowsheets (Taken 03/26/2020 0000)  Medication Review/Management: medications reviewed

## 2020-03-27 ENCOUNTER — Encounter (HOSPITAL_COMMUNITY): Payer: Self-pay | Admitting: Urology

## 2020-03-27 ENCOUNTER — Inpatient Hospital Stay (HOSPITAL_COMMUNITY): Payer: 59

## 2020-03-27 LAB — BASIC METABOLIC PANEL
ANION GAP: 9 mmol/L (ref 4–13)
BUN/CREA RATIO: 19 (ref 6–22)
BUN: 59 mg/dL — ABNORMAL HIGH (ref 8–25)
CALCIUM: 8.6 mg/dL (ref 8.5–10.0)
CHLORIDE: 102 mmol/L (ref 96–111)
CO2 TOTAL: 20 mmol/L — ABNORMAL LOW (ref 22–30)
CREATININE: 3.12 mg/dL — ABNORMAL HIGH (ref 0.60–1.05)
ESTIMATED GFR: 17 mL/min/BSA — ABNORMAL LOW (ref >=60)
GLUCOSE: 186 mg/dL — ABNORMAL HIGH (ref 65–125)
POTASSIUM: 4.4 mmol/L (ref 3.5–5.1)
SODIUM: 131 mmol/L — ABNORMAL LOW (ref 136–145)

## 2020-03-27 LAB — POC FINGERSTICK GLUCOSE - BMC/JMC (RESULTS)
GLUCOSE, POC: 173 mg/dL — ABNORMAL HIGH (ref 60–100)
GLUCOSE, POC: 185 mg/dL — ABNORMAL HIGH (ref 60–100)
GLUCOSE, POC: 160 mg/dL — ABNORMAL HIGH (ref 60–100)
GLUCOSE, POC: 238 mg/dL — ABNORMAL HIGH (ref 60–100)

## 2020-03-27 LAB — CBC
HCT: 26.6 % — ABNORMAL LOW (ref 36.0–45.0)
HGB: 9.1 g/dL — ABNORMAL LOW (ref 12.0–15.5)
MCH: 30.1 pg (ref 27.5–33.2)
MCHC: 34.1 g/dL (ref 32.0–36.0)
MCV: 88.3 fL (ref 82.0–97.0)
MPV: 8.7 fL (ref 7.4–10.5)
PLATELETS: 175 10*3/uL (ref 150–450)
RBC: 3.01 10*6/uL — ABNORMAL LOW (ref 4.00–5.10)
RDW: 14.3 % (ref 11.0–16.0)
WBC: 10.6 10*3/uL (ref 4.0–11.0)

## 2020-03-27 LAB — LACTIC ACID LEVEL: LACTIC ACID: 0.7 mmol/L (ref 0.5–2.2)

## 2020-03-27 LAB — PROCALCITONIN: PROCALCITONIN: 0.79 ng/mL — ABNORMAL HIGH (ref <0.50)

## 2020-03-27 LAB — B-TYPE NATRIURETIC PEPTIDE (BNP),PLASMA: BNP: 23 pg/mL (ref <=99)

## 2020-03-27 LAB — C-REACTIVE PROTEIN(CRP),INFLAMMATION: CRP INFLAMMATION: 153.5 mg/L — ABNORMAL HIGH (ref <8.0)

## 2020-03-27 MED ORDER — SODIUM BICARBONATE 650 MG TABLET
650.00 mg | ORAL_TABLET | Freq: Two times a day (BID) | ORAL | Status: DC
Start: 2020-03-27 — End: 2020-03-29
  Administered 2020-03-27 – 2020-03-29 (×5): 650 mg via ORAL
  Filled 2020-03-27 (×9): qty 1

## 2020-03-27 MED ORDER — ACETAMINOPHEN 1,000 MG/100 ML (10 MG/ML) INTRAVENOUS SOLUTION
1000.00 mg | Freq: Three times a day (TID) | INTRAVENOUS | Status: AC
Start: 2020-03-27 — End: 2020-03-28
  Administered 2020-03-27: 0 mg via INTRAVENOUS
  Administered 2020-03-27 (×2): 1000 mg via INTRAVENOUS
  Administered 2020-03-27 – 2020-03-28 (×2): 0 mg via INTRAVENOUS
  Administered 2020-03-28: 1000 mg via INTRAVENOUS
  Filled 2020-03-27 (×3): qty 100

## 2020-03-27 MED ORDER — SODIUM CHLORIDE 0.9 % INTRAVENOUS PIGGYBACK
2.0000 g | INTRAVENOUS | Status: DC
Start: 2020-03-27 — End: 2020-03-29
  Administered 2020-03-27: 0 g via INTRAVENOUS
  Administered 2020-03-27 – 2020-03-28 (×2): 2 g via INTRAVENOUS
  Administered 2020-03-28: 0 g via INTRAVENOUS
  Administered 2020-03-29: 2 g via INTRAVENOUS
  Administered 2020-03-29: 0 g via INTRAVENOUS
  Filled 2020-03-27 (×4): qty 20

## 2020-03-27 MED ORDER — SODIUM CHLORIDE 0.9 % INTRAVENOUS SOLUTION
INTRAVENOUS | Status: DC
Start: 2020-03-27 — End: 2020-03-28
  Administered 2020-03-28: 0 mL via INTRAVENOUS

## 2020-03-27 MED ORDER — LACTOBACILLUS ACIDOPH-L.BULGARICUS 1 MILLION CELL TABLET
1.0000 | ORAL_TABLET | Freq: Two times a day (BID) | ORAL | Status: DC
Start: 2020-03-27 — End: 2020-03-29
  Administered 2020-03-27 – 2020-03-29 (×5): 1 via ORAL
  Filled 2020-03-27 (×9): qty 1

## 2020-03-27 MED ORDER — LEVALBUTEROL 1.25 MG/3ML NEB SOLUTION - EAST
1.25 mg | INHALATION_SOLUTION | RESPIRATORY_TRACT | Status: DC | PRN
Start: 2020-03-27 — End: 2020-03-29

## 2020-03-27 MED ORDER — POLYETHYLENE GLYCOL 3350 17 GRAM ORAL POWDER PACKET
17.00 g | Freq: Two times a day (BID) | ORAL | Status: DC
Start: 2020-03-27 — End: 2020-03-29
  Administered 2020-03-27 – 2020-03-29 (×5): 17 g via ORAL
  Filled 2020-03-27 (×5): qty 1

## 2020-03-27 MED ORDER — SENNOSIDES 8.6 MG-DOCUSATE SODIUM 50 MG TABLET
1.00 | ORAL_TABLET | Freq: Two times a day (BID) | ORAL | Status: DC
Start: 2020-03-27 — End: 2020-03-29
  Administered 2020-03-27 – 2020-03-29 (×5): 1 via ORAL
  Filled 2020-03-27 (×5): qty 1

## 2020-03-27 MED ORDER — LEVALBUTEROL 1.25 MG/3ML NEB SOLUTION - EAST
1.25 mg | INHALATION_SOLUTION | Freq: Three times a day (TID) | RESPIRATORY_TRACT | Status: DC
Start: 2020-03-27 — End: 2020-03-27

## 2020-03-27 NOTE — Progress Notes (Addendum)
Notified by nursing of post operative fever.  No specific complaints, but pulse ox lower today than yesterday.  Will order xray, labs, blood culture and empiracally start rocephin.  Will follow for results.

## 2020-03-27 NOTE — Progress Notes (Signed)
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)    Brief history of current situation:    6/30 - admitted for elective left radical nephrectomy due to renal mass/renal cell cancer    7/3 Patient spiked fever 101.2 at 0300, CXR, BC x 2 and empiric rocephin ordered.     Chief Complaint:follow up biopsy results    Subjective:Patient is alert and oriented x4. Painful to abdomen. Incision site is intact and no S/S infection. H/H stable at 9.1/26.6. Patient afebrile currently. CRP is 153.5 and procalcitonin is 0.79. CXR showed atelectasis. Lungs diminished, no coarse crackles or wheezing to auscultation. Need aggressive pulmonary toileting measures. No chest pain, SOB. Lungs clear to auscultation. HRR. No questions or concerns at this time.     Assessment/ Plan:    Fever   -spiked 101.2  - lactic acid 0.7  -BC x 2 pending   -respiratory consult  -will add Xopenex   -BNP ordered  -IV fluids continued  -CXR showed atelectasis  -aggressive pulmonary toileting measures  -IV rocephin initiated  -CRP 153.5 and procalcitonin 0.79    HTN: Well controlled. Discontinue Amlodipine and diuretic and agree with Dr. Judeth Horn and keep Losartan 25 mg daily    DM: Hold home Po meds. Will order Sliding scale and increased   Agree with Dr. Judeth Horn to start tradjenta 5 mg daily and no farxiga at discharge and ok to restart Jardiance 10 mg/day    S/p L nephrectomy. Mngmt per Dr. Kerin Perna  Janene Harvey control (added lidocaine patches)  H/H 9.7/29.0 to be expected post surgery.     CKD III/hyperkalemia, acute/mild hyponatremia: Dr. Charlane Ferretti  Creatinine 3.12, GFR 17  D/C'd lokelma per Dr. Judeth Horn      Overall PLAN    Discharge will be at the discretion of admitting provider    Vital Signs:  Temp (24hrs) Max:38.4 C  (101.2 F)      Temperature: 36.9 C (98.5 F)  BP (Non-Invasive): (!) 120/58  MAP (Non-Invasive): 73 mmHG  Heart Rate: 95  Respiratory Rate: 17  Pain Score (READ ONLY): 8  SpO2: 95 %    Current Medications:  acetaminophen (OFIRMEV) 10 mg/mL IV 1,000 mg, 1,000 mg, Intravenous, Q8HRS  atorvastatin (LIPITOR) tablet, 20 mg, Oral, QPM  cefTRIAXone (ROCEPHIN) 2 g in NS 50 mL IVPB minibag, 2 g, Intravenous, Q24H  dapagliflozin (FARXIGA) tablet, 5 mg, Oral, Daily  escitalopram (LEXAPRO) tablet, 10 mg, Oral, Daily  famotidine (PEPCID) tablet, 20 mg, Oral, Daily  lactobacillus acidoph-bulgar (FLORANEX) 1 million cell tablet, 1 Tablet, Oral, 2x/day-Food  lidocaine-menthol (LIDOPATCH) 3.6%-1.25% patch, 2 Patch, Transdermal, Daily  linagliptin (TRADJENTA) tablet, 5 mg, Oral, Daily  losartan (COZAAR) tablet, 25 mg, Oral, Daily  morphine 4 mg/mL injection, 2 mg, Intravenous, Q4H PRN  NS flush syringe, 10 mL, Intravenous, Q8H  NS flush syringe, 10 mL, Intravenous, Q1H PRN  NS premix infusion, , Intravenous, Continuous  oxyCODONE-acetaminophen (PERCOCET) 5-350m per tablet, 2 Tablet, Oral, Q4H PRN  oxyCODONE-acetaminophen (PERCOCET) 5-3253mper tablet, 1 Tablet, Oral, Q4H PRN  polyethylene glycol (MIRALAX) oral packet, 17 g, Oral, 2x/day  prochlorperazine (COMPAZINE) 5 mg/mL injection, 10 mg, Intravenous, Q6H PRN  sennosides-docusate sodium (SENOKOT-S) 8.6-50mg per tablet, 1 Tablet, Oral, 2x/day  sodium bicarbonate tablet,  650 mg, Oral, 2x/day  SSIP insulin R human 100 units/mL injection, 1-12 Units, Subcutaneous, 4x/day AC      Today's Physical Exam:   General: appearsmorbidly obese and acutely ill but in no acute distress.  Eyes: Pupils equal and round  HENT: Head atraumatic and normocephalic  Neck: No JVD  Lungs: CTAB, non labored breathing, no rales or wheezing.   Cardiovascular: regular rate and rhythm,  Abdomen:morbidly obese, soft,surgically tender as expected; incision CDI with dermabond, bowel sounds normal   Extremities: extremities normal, atraumatic, no cyanosis or edema  Skin: Skin warm and dry  Neurologic: Grossly normal  Psychiatric: Normal affect, behavior     I/O:  I/O last 24 hours:      Intake/Output Summary (Last 24 hours) at 03/27/2020 1254  Last data filed at 03/27/2020 1200  Gross per 24 hour   Intake 2642.1 ml   Output 1350 ml   Net 1292.1 ml     I/O current shift:  07/03 0700 - 07/03 1859  In: 856 [P.O.:480; I.V.:376]  Out: 300 [Urine:300]      Labs  Please indicate ordered or reviewed)  I have reviewed all labs for the past 24 hours; see Epic for full results        Radiology Tests (Please indicate ordered or reviewed)  Interpreted by radiologist and individually reviewed by me:     See Epic        Problem List:  Active Hospital Problems   (*Primary Problem)    Diagnosis   . 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    Samella Parr, NP

## 2020-03-27 NOTE — Nurses Notes (Signed)
Pt aware of respiratory biofire order. Pt wants to refuse this test. Pt is aware of the test and the reason for it.

## 2020-03-27 NOTE — Progress Notes (Signed)
[facility]  PROGRESS NOTE        Subjective:  Febrile this morning with work-up in progress. Non-oliguric. Having some nausea and vomiting, but improving overall.       Objective:    Filed Vitals:    03/27/20 0244 03/27/20 0500 03/27/20 0545 03/27/20 0750   BP:  (!) 123/55  133/72   Pulse:  (!) 107  (!) 111   Resp:  18 16 17    Temp: 36.9 C (98.5 F) (!) 38.2 C (100.7 F)  36.6 C (97.8 F)   SpO2:  96%  92%         Intake/Output Summary (Last 24 hours) at 03/27/2020 0859  Last data filed at 03/27/2020 0802  Gross per 24 hour   Intake 1906.1 ml   Output 1350 ml   Net 556.1 ml       General: Well-appearing, no acute distress  HEENT: Anicteric, no ocular discharge, mucous membranes moist.  Neck:  No apparent JVD.  Cardiac: Regular rate and rhythm. No appreciable murmur, rub, or gallop.   Lungs: Clear to auscultation. No wheezing, rhonchi, or rales.   Extremities: No edema.  Neuro: Alert and oriented.  Skin: Warm and dry, no apparent rashes.  Psych: Mood and affect appropriate.     Medications  .  acetaminophen (OFIRMEV) 10 mg/mL IV 1,000 mg 1,000 mg Q8HRS  .  atorvastatin (LIPITOR) tablet 20 mg QPM  .  cefTRIAXone (ROCEPHIN) 2 g in NS 50 mL IVPB minibag 2 g Q24H  .  dapagliflozin (FARXIGA) tablet 5 mg Daily  .  escitalopram (LEXAPRO) tablet 10 mg Daily  .  famotidine (PEPCID) tablet 20 mg Daily  .  lactobacillus acidoph-bulgar (FLORANEX) 1 million cell tablet 1 Tablet 2x/day-Food  .  lidocaine-menthol [facility]) 3.6%-1.25% patch 2 Patch Daily  .  linagliptin (TRADJENTA) tablet 5 mg Daily  .  losartan (COZAAR) tablet 25 mg Daily  .  NS flush syringe 10 mL Q8H  .  polyethylene glycol (MIRALAX) oral packet 17 g 2x/day  .  sennosides-docusate sodium (SENOKOT-S) 8.6-50mg  per tablet 1 Tablet 2x/day  .  SSIP insulin R human 100 units/mL injection 1-12 Units 4x/day AC    HYDROmorphone (DILAUDID) 0.5 mg/0.5 mL injection, 0.5 mg, PACU Q5 Min PRN  .  morphine 4 mg/mL injection, 2 mg, Q4H PRN  .  NS flush syringe, 10  mL, Q1H PRN  .  oxyCODONE-acetaminophen (PERCOCET) 5-325mg  per tablet, 2 Tablet, Q4H PRN  .  oxyCODONE-acetaminophen (PERCOCET) 5-325mg  per tablet, 1 Tablet, Q4H PRN  .  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 Creatinine appears to have stabilized today.    # Hyponatremia. Slightly improved today.   # Non-anion gap metabolic acidosis. Likely due to AKI/CKD.   # Hypertension: Controlled at present.   # Fever. Post-op fever work-up in progress    Plan  1. Continue IV fluids for now.   2. PO sodium bicarb started.  3. Work-up of fever in progress  4. Patient to follow-up with Dr. Judeth Horn on discharge. Per his request, at time of discharge, she should have the following medication adjustments.  a. Losartan 25 mg daily as only BP medication. No diuretic or amlodipine  b. Tradjenta 5mg  daily. Will need new prescription.  c. No Farxiga at discharge. Resume Jardiance 10 mg daily.  d. Famotidine 20 mg daily. No pantoprazole.       Algis Liming, MD

## 2020-03-27 NOTE — Nurses Notes (Addendum)
Pt tachy and starting to spike temp 101.2 (see recent labs attached). 2 Perc 5/325 admin now for pain and fever. Notified on-call Hospitalist Dr. Paulita Cradle asking if any further testing, meds for fever, or a lactic / CBC ordered. Pt presently has no labs ordered at this time.     Dr. Paulita Cradle put in orders for Lactic, CBC, Blood cultures and CXR to be obtained at this time.        03/27/20 0100   Vital Signs   Temperature (!) 38.4 C (101.2 F)   Temp Source Oral   Heart Rate (!) 120   Respiratory Rate 18   BP (Non-Invasive) (!) 143/59   MAP (Non-Invasive) 80 mmHG

## 2020-03-27 NOTE — Care Plan (Signed)
T max 101.2 and Tachycardic w/ BP trending high ( see below). C/o pain and nausea Medicated PRN percocet and Compazine per MAR. 2100 BS 155 2U ssip Humalog admin. Abdominal incision X3 CDI. Pt voiding clear yellow urine 800cc output overnight. Passing flatus. BS active X4 quad. No BM IV soft patent infusing IVF and Abx per MAR. SEDs in place.     03/26/20 2100 03/26/20 2145 03/27/20 0100   Vital Signs   Temperature 37.2 C (99 F)  --  (!) 38.4 C (101.2 F)   Temp Source Thermoscan  --  Oral   Heart Rate (!) 115  --  (!) 120   Respiratory Rate 18 16 18    BP (Non-Invasive) (!) 149/72  --  (!) 143/59   MAP (Non-Invasive) 91 mmHG  --  80 mmHG      03/27/20 0215 03/27/20 0244 03/27/20 0500   Vital Signs   Temperature  --  36.9 C (98.5 F)  (100.43f thermascan) (!) 38.2 C (100.7 F)   Temp Source  --  Oral Oral   Heart Rate  --   --  (!) 107   Respiratory Rate 18  --  18   BP (Non-Invasive)  --   --  (!) 123/55   MAP (Non-Invasive)  --   --  72 mmHG     Problem: Renal Function Impairment (Chronic Kidney Disease)  Goal: Laboratory Values and Blood Pressure Within Desired Range  Intervention: Monitor and Support Renal Function  Flowsheets (Taken 03/26/2020 2300)  Medication Review/Management: medications reviewed     Problem: Oral Intake Inadequate (Chronic Kidney Disease)  Goal: Optimal Oral Intake  Intervention: Promote and Optimize Oral Intake  Flowsheets (Taken 03/26/2020 2300)  Oral Nutrition Promotion:   rest periods promoted   social interaction promoted   physical activity promoted     Problem: Hematologic Alteration (Chronic Kidney Disease)  Goal: Absence of Anemia Signs/Symptoms  Intervention: Manage Signs of Anemia and Bleeding  Flowsheets  Taken 03/26/2020 2300 by Arnette Schaumann, RN  Environmental Support:   calm environment promoted   rest periods encouraged  Taken 03/26/2020 0835 by Tama Gander, NURSE EXTERN  Bleeding Precautions: blood pressure closely monitored     Problem: Functional Decline  (Chronic Kidney Disease)  Goal: Optimal Functional Ability  Intervention: Optimize Functional Ability  Flowsheets (Taken 03/26/2020 2300)  Activity Management:   activity adjusted per tolerance   activity encouraged   ambulated in room     Problem: Fluid Volume Excess (Chronic Kidney Disease)  Goal: Fluid Balance  Intervention: Monitor and Manage Hypervolemia  Flowsheets (Taken 03/26/2020 2300)  Skin Protection:   adhesive use limited   transparent dressing maintained  Fluid/Electrolyte Management: fluids provided     Problem: Electrolyte Imbalance (Chronic Kidney Disease)  Goal: Electrolyte Balance  Intervention: Monitor and Manage Electrolyte Imbalance  Flowsheets (Taken 03/26/2020 2300)  Fluid/Electrolyte Management: fluids provided     Problem: Adjustment to Illness (Chronic Kidney Disease)  Goal: Optimal Coping with Chronic Illness  Intervention: Support Psychosocial Response (CKD)  Flowsheets  Taken 03/26/2020 2300 by Arnette Schaumann, RN  Supportive Measures:   active listening utilized   self-responsibility promoted   verbalization of feelings encouraged   positive reinforcement provided   self-care encouraged  Taken 03/26/2020 0835 by Tama Gander, NURSE EXTERN  Family/Support System Care:   self-care encouraged   support provided     Problem: Postoperative Urinary Retention (Surgery Nonspecified)  Goal: Effective Urinary Elimination  Intervention: Monitor and Manage Urinary Retention  Flowsheets (  Taken 03/26/2020 2300)  Urinary Elimination Promotion:   frequent voiding encouraged   voiding relaxation promoted     Problem: Pain (Surgery Nonspecified)  Goal: Acceptable Pain Control  Intervention: Prevent or Manage Pain  Flowsheets (Taken 03/26/2020 2300)  Diversional Activities:   television   smartphone     Problem: Infection (Surgery Nonspecified)  Goal: Absence of Infection Signs and Symptoms  Intervention: Prevent or Manage Infection  Flowsheets  Taken 03/26/2020 2300 by Arnette Schaumann, RN   Fever Reduction/Comfort Measures:   lightweight bedding   lightweight clothing   medication administered   fluid intake increased  Taken 03/26/2020 0835 by Tama Gander, NURSE EXTERN  Infection Management: aseptic technique maintained     Problem: Bowel Motility Impaired (Surgery Nonspecified)  Goal: Effective Bowel Elimination  Intervention: Enhance Bowel Motility and Elimination  Flowsheets  Taken 03/26/2020 2300 by Arnette Schaumann, RN  Bowel Motility Enhancement:   ambulation promoted   oral intake encouraged  Taken 03/26/2020 0835 by Tama Gander, NURSE EXTERN  Bowel Elimination Management:   hygiene measures promoted   relaxation techniques promoted   toileting offered     Problem: Bleeding (Surgery Nonspecified)  Goal: Absence of Bleeding  Intervention: Monitor and Manage Bleeding  Flowsheets (Taken 03/26/2020 0835 by Tama Gander, NURSE EXTERN)  Bleeding Management: dressing monitored     Problem: Fall Injury Risk  Goal: Absence of Fall and Fall-Related Injury  Intervention: Identify and Manage Contributors  Flowsheets  Taken 03/26/2020 2300 by Arnette Schaumann, RN  Medication Review/Management: medications reviewed  Taken 03/26/2020 0835 by Tama Gander, NURSE EXTERN  Self-Care Promotion:   independence encouraged   BADL personal objects within reach   BADL personal routines maintained  Intervention: Promote Injury-Free Environment  Flowsheets (Taken 03/26/2020 2300)  Safety Promotion/Fall Prevention:   fall prevention program maintained   nonskid shoes/slippers when out of bed   safety round/check completed

## 2020-03-27 NOTE — Care Plan (Signed)
Pt has been up ambulating in her room, around the halls of the unit and took a shower. She is using her IS and has been encouraged to cough. LS CL but diminished in bases. BS +, no Bm in this shift. Incisions clean and dry. IV in R hand flushed with blood return. Pt reports feeling tired but states she otherwise feels better. Family was at the bedside and was helpful to her. Pt is aware of the ordered respiratory panel. She has refused it as of this time. She has been afebrile throughout this shift.       Problem: Adult Inpatient Plan of Care  Goal: Plan of Care Review  Outcome: Ongoing (see interventions/notes)  Goal: Patient-Specific Goal (Individualized)  Outcome: Ongoing (see interventions/notes)  Goal: Absence of Hospital-Acquired Illness or Injury  Outcome: Ongoing (see interventions/notes)  Goal: Optimal Comfort and Wellbeing  Outcome: Ongoing (see interventions/notes)  Goal: Rounds/Family Conference  Outcome: Ongoing (see interventions/notes)     Problem: Fall Injury Risk  Goal: Absence of Fall and Fall-Related Injury  Outcome: Ongoing (see interventions/notes)     Problem: Bleeding (Surgery Nonspecified)  Goal: Absence of Bleeding  Outcome: Ongoing (see interventions/notes)     Problem: Bowel Motility Impaired (Surgery Nonspecified)  Goal: Effective Bowel Elimination  Outcome: Ongoing (see interventions/notes)     Problem: Infection (Surgery Nonspecified)  Goal: Absence of Infection Signs and Symptoms  Outcome: Ongoing (see interventions/notes)     Problem: Ongoing Anesthesia Effects (Surgery Nonspecified)  Goal: Anesthesia/Sedation Recovery  Outcome: Ongoing (see interventions/notes)     Problem: Pain (Surgery Nonspecified)  Goal: Acceptable Pain Control  Outcome: Ongoing (see interventions/notes)     Problem: Postoperative Urinary Retention (Surgery Nonspecified)  Goal: Effective Urinary Elimination  Outcome: Ongoing (see interventions/notes)     Problem: Adjustment to Illness (Chronic Kidney Disease)   Goal: Optimal Coping with Chronic Illness  Outcome: Ongoing (see interventions/notes)     Problem: Electrolyte Imbalance (Chronic Kidney Disease)  Goal: Electrolyte Balance  Outcome: Ongoing (see interventions/notes)     Problem: Fluid Volume Excess (Chronic Kidney Disease)  Goal: Fluid Balance  Outcome: Ongoing (see interventions/notes)     Problem: Functional Decline (Chronic Kidney Disease)  Goal: Optimal Functional Ability  Outcome: Ongoing (see interventions/notes)     Problem: Hematologic Alteration (Chronic Kidney Disease)  Goal: Absence of Anemia Signs/Symptoms  Outcome: Ongoing (see interventions/notes)     Problem: Oral Intake Inadequate (Chronic Kidney Disease)  Goal: Optimal Oral Intake  Outcome: Ongoing (see interventions/notes)     Problem: Renal Function Impairment (Chronic Kidney Disease)  Goal: Laboratory Values and Blood Pressure Within Desired Range  Outcome: Ongoing (see interventions/notes)

## 2020-03-28 LAB — BASIC METABOLIC PANEL
ANION GAP: 10 mmol/L (ref 4–13)
BUN/CREA RATIO: 20 (ref 6–22)
BUN: 56 mg/dL — ABNORMAL HIGH (ref 8–25)
CALCIUM: 8.8 mg/dL (ref 8.5–10.0)
CHLORIDE: 108 mmol/L (ref 96–111)
CO2 TOTAL: 19 mmol/L — ABNORMAL LOW (ref 22–30)
CREATININE: 2.86 mg/dL — ABNORMAL HIGH (ref 0.60–1.05)
ESTIMATED GFR: 18 mL/min/BSA — ABNORMAL LOW (ref >=60)
GLUCOSE: 172 mg/dL — ABNORMAL HIGH (ref 65–125)
POTASSIUM: 4.3 mmol/L (ref 3.5–5.1)
SODIUM: 137 mmol/L (ref 136–145)

## 2020-03-28 LAB — CBC W/AUTO DIFF
BASOPHIL #: 0 10*3/uL (ref 0.00–0.10)
BASOPHIL %: 0 % (ref 0–3)
EOSINOPHIL #: 0.1 10*3/uL (ref 0.00–0.50)
EOSINOPHIL %: 1 % (ref 0–5)
HCT: 24.3 % — ABNORMAL LOW (ref 36.0–45.0)
HGB: 8.3 g/dL — ABNORMAL LOW (ref 12.0–15.5)
LYMPHOCYTE #: 0.8 10*3/uL — ABNORMAL LOW (ref 1.00–4.80)
LYMPHOCYTE %: 13 % — ABNORMAL LOW (ref 15–43)
MCH: 29.9 pg (ref 27.5–33.2)
MCHC: 34.2 g/dL (ref 32.0–36.0)
MCV: 87.4 fL (ref 82.0–97.0)
MONOCYTE #: 0.5 10*3/uL (ref 0.20–0.90)
MONOCYTE %: 7 % (ref 5–12)
MPV: 8.6 fL (ref 7.4–10.5)
NEUTROPHIL #: 5.2 10*3/uL (ref 1.50–6.50)
NEUTROPHIL %: 79 % — ABNORMAL HIGH (ref 43–76)
PLATELETS: 169 10*3/uL (ref 150–450)
RBC: 2.79 10*6/uL — ABNORMAL LOW (ref 4.00–5.10)
RDW: 13.9 % (ref 11.0–16.0)
WBC: 6.6 10*3/uL (ref 4.0–11.0)

## 2020-03-28 LAB — POC FINGERSTICK GLUCOSE - BMC/JMC (RESULTS)
GLUCOSE, POC: 178 mg/dL — ABNORMAL HIGH (ref 60–100)
GLUCOSE, POC: 173 mg/dL — ABNORMAL HIGH (ref 60–100)
GLUCOSE, POC: 159 mg/dL — ABNORMAL HIGH (ref 60–100)

## 2020-03-28 MED ORDER — ACETAMINOPHEN 325 MG TABLET
650.00 mg | ORAL_TABLET | Freq: Four times a day (QID) | ORAL | Status: DC | PRN
Start: 2020-03-28 — End: 2020-03-29
  Administered 2020-03-28 (×2): 650 mg via ORAL
  Filled 2020-03-28 (×2): qty 2

## 2020-03-28 MED ORDER — ACETAMINOPHEN 325 MG TABLET
650.00 mg | ORAL_TABLET | Freq: Four times a day (QID) | ORAL | Status: DC | PRN
Start: 2020-03-28 — End: 2020-03-28

## 2020-03-28 MED ORDER — BISACODYL 10 MG RECTAL SUPPOSITORY
10.00 mg | Freq: Every day | RECTAL | Status: DC | PRN
Start: 2020-03-28 — End: 2020-03-29
  Administered 2020-03-28: 10 mg via RECTAL
  Filled 2020-03-28 (×3): qty 1

## 2020-03-28 NOTE — Care Plan (Signed)
[name], slightly diminished in the left base. BS +, BM today, pt states relief of abd discomfort. She has ambulated around the room and has been up in a chair most of this shift. Tylenol given once for pain, see MAR. Also, a lidocaine patch was placed this morning in the left flank area. Pt states relief. FSBS ac and hs, ss coverage, see MAR. Family has been at the bedside and pt seems more comfortable and interactive as this shift has continued.   Problem: Adult Inpatient Plan of Care  Goal: Plan of Care Review  Outcome: Ongoing (see interventions/notes)  Goal: Patient-Specific Goal (Individualized)  Outcome: Ongoing (see interventions/notes)  Goal: Absence of Hospital-Acquired Illness or Injury  Outcome: Ongoing (see interventions/notes)  Intervention: Identify and Manage Fall Risk  Flowsheets (Taken 03/27/2020 2100 by Arnette Schaumann, RN)  Safety Promotion/Fall Prevention:   fall prevention program maintained   nonskid shoes/slippers when out of bed   safety round/check completed  Intervention: Prevent Skin Injury  Flowsheets (Taken 03/27/2020 2100 by Arnette Schaumann, RN)  Body Position: supine, head elevated  Intervention: Prevent and Manage VTE (Venous Thromboembolism) Risk  Flowsheets (Taken 03/26/2020 2300 by Arnette Schaumann, RN)  VTE Prevention/Management:   ambulation promoted   sequential compression devices on  Intervention: Prevent Infection  Flowsheets (Taken 03/27/2020 2100 by Arnette Schaumann, RN)  Infection Prevention:   barrier precautions utilized   glycemic control managed   personal protective equipment utilized   promote handwashing   rest/sleep promoted  Goal: Optimal Comfort and Wellbeing  Outcome: Ongoing (see interventions/notes)  Intervention: Provide Person-Centered Care  Flowsheets (Taken 03/26/2020 2300 by Arnette Schaumann, RN)  Trust Relationship/Rapport:   care explained   choices provided   emotional support provided   empathic listening provided    questions answered   questions encouraged   reassurance provided   thoughts/feelings acknowledged  Goal: Rounds/Family Conference  Outcome: Ongoing (see interventions/notes)     Problem: Adult Inpatient Plan of Care  Goal: Absence of Hospital-Acquired Illness or Injury  Intervention: Prevent Skin Injury  Flowsheets (Taken 03/27/2020 2100 by Arnette Schaumann, RN)  Body Position: supine, head elevated     Problem: Adult Inpatient Plan of Care  Goal: Absence of Hospital-Acquired Illness or Injury  Intervention: Prevent and Manage VTE (Venous Thromboembolism) Risk  Flowsheets (Taken 03/26/2020 2300 by Arnette Schaumann, RN)  VTE Prevention/Management:   ambulation promoted   sequential compression devices on     Problem: Fall Injury Risk  Goal: Absence of Fall and Fall-Related Injury  Outcome: Ongoing (see interventions/notes)  Intervention: Identify and Manage Contributors  Flowsheets  Taken 03/28/2020 1806 by Iverson Alamin, RN  Medication Review/Management: medications reviewed  Taken 03/27/2020 2100 by Arnette Schaumann, RN  Self-Care Promotion:   independence encouraged   BADL personal objects within reach  Intervention: Promote [place] (Taken 03/27/2020 2100 by Arnette Schaumann, RN)  Safety Promotion/Fall Prevention:   fall prevention program maintained   nonskid shoes/slippers when out of bed   safety round/check completed     Problem: Bleeding (Surgery Nonspecified)  Goal: Absence of Bleeding  Outcome: Ongoing (see interventions/notes)     Problem: Bowel Motility Impaired (Surgery Nonspecified)  Goal: Effective Bowel Elimination  Outcome: Ongoing (see interventions/notes)  Intervention: Enhance Bowel Motility and Elimination  Flowsheets (Taken 03/28/2020 1806)  Bowel Elimination Management:   toileting offered   suppository given   sitting position facilitated   relaxation techniques promoted   hygiene measures promoted  Bowel Motility Enhancement:  ambulation promoted   fluid intake encouraged   oral intake encouraged     Problem: Infection (Surgery Nonspecified)  Goal: Absence of Infection Signs and Symptoms  Outcome: Ongoing (see interventions/notes)     Problem: Ongoing Anesthesia Effects (Surgery Nonspecified)  Goal: Anesthesia/Sedation Recovery  Outcome: Ongoing (see interventions/notes)  Intervention: Optimize Anesthesia Recovery  Flowsheets  Taken 03/28/2020 1806 by Iverson Alamin, RN  $ Level Of Assistance: Independent  Patient Tolerance (IS): good  Taken 03/28/2020 1100 by Iverson Alamin, RN  Reorientation Measures: clock in view  Taken 03/27/2020 2100 by Arnette Schaumann, RN  Safety Promotion/Fall Prevention:   fall prevention program maintained   nonskid shoes/slippers when out of bed   safety round/check completed  Taken 03/26/2020 1400 by Despina Hidden, RN  Treatment Status: Given  Taken 03/25/2020 1641 by Despina Hidden, RN  Outcome Anesthesia/Sedation Recovery: progressing toward baseline     Problem: Pain (Surgery Nonspecified)  Goal: Acceptable Pain Control  Outcome: Ongoing (see interventions/notes)  Intervention: Prevent or Manage Pain  Flowsheets  Taken 03/27/2020 2100 by Arnette Schaumann, RN  Diversional Activities:   smartphone   television  Taken 03/26/2020 0536 by Jackelyn Hoehn, RN  Pain Management Interventions:   care clustered   pain management plan reviewed with patient/caregiver   quiet environment facilitated     Problem: Postoperative Urinary Retention (Surgery Nonspecified)  Goal: Effective Urinary Elimination  Outcome: Ongoing (see interventions/notes)  Intervention: Monitor and Manage Urinary Retention  Flowsheets (Taken 03/27/2020 2100 by Arnette Schaumann, RN)  Urinary Elimination Promotion:   frequent voiding encouraged   toileting offered     Problem: Adjustment to Illness (Chronic Kidney Disease)  Goal: Optimal Coping with Chronic Illness  Outcome: Ongoing (see interventions/notes)   Intervention: Support Psychosocial Response (CKD)  Flowsheets  Taken 03/27/2020 2100 by Arnette Schaumann, RN  Supportive Measures:   active listening utilized   positive reinforcement provided   self-care encouraged   self-reflection promoted   self-responsibility promoted   verbalization of feelings encouraged  Taken 03/26/2020 0835 by Tama Gander, [place]:   self-care encouraged   support provided     Problem: Electrolyte Imbalance (Chronic Kidney Disease)  Goal: Electrolyte Balance  Outcome: Ongoing (see interventions/notes)  Intervention: Monitor and Manage Electrolyte Imbalance  Flowsheets (Taken 03/27/2020 2100 by Arnette Schaumann, RN)  Fluid/Electrolyte Management: (Na Bicarb admin per [facility])   fluids provided   electrolyte supplement initiated     Problem: Fluid Volume Excess (Chronic Kidney Disease)  Goal: Fluid Balance  Outcome: Ongoing (see interventions/notes)  Intervention: Monitor and Manage Hypervolemia  Flowsheets (Taken 03/27/2020 2100 by Arnette Schaumann, RN)  Skin Protection:   adhesive use limited   transparent dressing maintained  Fluid/Electrolyte Management: (Na Bicarb admin per [facility])   fluids provided   electrolyte supplement initiated     Problem: Functional Decline (Chronic Kidney Disease)  Goal: Optimal Functional Ability  Outcome: Ongoing (see interventions/notes)     Problem: Hematologic Alteration (Chronic Kidney Disease)  Goal: Absence of Anemia Signs/Symptoms  Outcome: Ongoing (see interventions/notes)     Problem: Oral Intake Inadequate (Chronic Kidney Disease)  Goal: Optimal Oral Intake  Outcome: Ongoing (see interventions/notes)  Intervention: Promote and Optimize Oral Intake  Flowsheets (Taken 03/27/2020 2100 by Arnette Schaumann, RN)  Oral Nutrition Promotion:   physical activity promoted   social interaction promoted   rest periods promoted     Problem: Renal Function Impairment (Chronic Kidney Disease)  Goal:  Laboratory Values and Blood Pressure Within Desired Range  Outcome: Ongoing (see interventions/notes)  Intervention: Monitor and Support Renal Function  Flowsheets (Taken 03/28/2020 1806)  Medication Review/Management: medications reviewed

## 2020-03-28 NOTE — Care Plan (Signed)
VSS and afebrile. No c/o pain N3-4 well managed w/ Scheduled Ofirmev. No PRN meds admin during this RN shift. 2100 BS 238 4U ssip Humalog admin. Lungs clear/diminished in peripheral bases. IS encouraged.  Sats have remained upper 90's overnight. Abdominal incision X3 CDI. Pt voiding appropriate amt clear yellow urine per doc flow. BS active X4 quad. No BM since 6/30. Pt has been passing flatus ands plans to ambulate halls after breakfast this morning. IV soft patent infusing IVF and Abx per MAR. SEDs in place. Blood cultures w/ no growth 18-24 hrs. BMP and CBC obtained awaiting results.     Problem: Renal Function Impairment (Chronic Kidney Disease)  Goal: Laboratory Values and Blood Pressure Within Desired Range  Intervention: Monitor and Support Renal Function  Flowsheets (Taken 03/27/2020 2100)  Medication Review/Management: medications reviewed     Problem: Oral Intake Inadequate (Chronic Kidney Disease)  Goal: Optimal Oral Intake  Intervention: Promote and Optimize Oral Intake  Flowsheets (Taken 03/27/2020 2100)  Oral Nutrition Promotion:   physical activity promoted   social interaction promoted   rest periods promoted     Problem: Hematologic Alteration (Chronic Kidney Disease)  Goal: Absence of Anemia Signs/Symptoms  Intervention: Manage Signs of Anemia and Bleeding  Flowsheets (Taken 03/27/2020 2100)  Environmental Support:   calm environment promoted   personal routine supported   rest periods encouraged     Problem: Functional Decline (Chronic Kidney Disease)  Goal: Optimal Functional Ability  Intervention: Optimize Functional Ability  Flowsheets (Taken 03/27/2020 2100)  Activity Management:   activity adjusted per tolerance   activity encouraged   ambulated in room     Problem: Fluid Volume Excess (Chronic Kidney Disease)  Goal: Fluid Balance  Intervention: Monitor and Manage Hypervolemia  Flowsheets (Taken 03/27/2020 2100)  Skin Protection:   adhesive use limited   transparent dressing maintained   Fluid/Electrolyte Management: (Na Bicarb admin per MAR)   fluids provided   electrolyte supplement initiated     Problem: Electrolyte Imbalance (Chronic Kidney Disease)  Goal: Electrolyte Balance  Intervention: Monitor and Manage Electrolyte Imbalance  Flowsheets (Taken 03/27/2020 2100)  Fluid/Electrolyte Management: (Na Bicarb admin per [facility])   fluids provided   electrolyte supplement initiated     Problem: Adjustment to Illness (Chronic Kidney Disease)  Goal: Optimal Coping with Chronic Illness  Intervention: Support Psychosocial Response (CKD)  Flowsheets (Taken 03/27/2020 2100)  Supportive Measures:   active listening utilized   positive reinforcement provided   self-care encouraged   self-reflection promoted   self-responsibility promoted   verbalization of feelings encouraged     Problem: Pain (Surgery Nonspecified)  Goal: Acceptable Pain Control  Intervention: Prevent or Manage Pain  Flowsheets (Taken 03/27/2020 2100)  Diversional Activities:   smartphone   television     Problem: Infection (Surgery Nonspecified)  Goal: Absence of Infection Signs and Symptoms  Intervention: Prevent or Manage Infection  Flowsheets (Taken 03/27/2020 2100)  Fever Reduction/Comfort Measures:   fluid intake increased   lightweight bedding   lightweight clothing   medication administered  Infection Management:   aseptic technique maintained   cultures obtained and sent to lab     Problem: Bleeding (Surgery Nonspecified)  Goal: Absence of Bleeding  Intervention: Monitor and Manage Bleeding  Flowsheets (Taken 03/27/2020 2100)  Bleeding Management: dressing monitored     Problem: Fall Injury Risk  Goal: Absence of Fall and Fall-Related Injury  Intervention: Identify and Manage Contributors  Flowsheets (Taken 03/27/2020 2100)  Self-Care Promotion:   independence encouraged   BADL  personal objects within reach  Intervention: [place] (Taken 03/27/2020 2100)  Safety Promotion/Fall Prevention:   fall prevention program  maintained   nonskid shoes/slippers when out of bed   safety round/check completed

## 2020-03-28 NOTE — Progress Notes (Signed)
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 IV tylenol scheduled.     7/4 no fevers overnight.     Chief Complaint:follow up biopsy results    Subjective:Patient is alert and oriented x4. Painful to abdomen but patient states it is better. Incision site is intact and no S/S infection. H/H stable at 8.3/24.3.  Patient afebrile currently. CRP is 153.5 and procalcitonin is 0.79. CXR showed atelectasis. Lungs diminished, no coarse crackles or wheezing to auscultation. Need aggressive pulmonary toileting measures. No chest pain, SOB. Lungs clear to auscultation. HRR.No questions or concerns at this time.    Assessment/ Plan:    Fever - resolving  -spiked 101.2  - lactic acid 0.7  -BC x 2 pending   -respiratory consult  -will add Xopenex   -BNP ordered  -IV fluids continued  -CXR showed atelectasis  -aggressive pulmonary toileting measures  -IV rocephin initiated  -CRP 153.5 and procalcitonin 0.79    HTN: Well controlled.Discontinue Amlodipine and diuretic and agree with Dr. Judeth Horn and keep Losartan 25 mg daily    KX:FGHW home Po meds. Will order Sliding scale and increased   Agree with Dr. Judeth Horn to start tradjenta 5 mg daily and no farxiga at discharge and ok to restart Jardiance 10 mg/day    S/p L nephrectomy.Mngmt per Dr. Kerin Perna Janene Harvey control (added lidocaine patches)  H/H 9.7/29.0 to be expected post surgery.    CKD III/hyperkalemia, acute/mild hyponatremia:Dr. Welchfollowing  Creatinine 3.12, GFR 17  D/C'd lokelma per Dr. Judeth Horn      Overall PLAN     Discharge will be at the discretion of admitting provider    Vital Signs:  Temp (24hrs) Max:37.1 C (98.8 F)      Temperature: 36.7 C (98.1 F)  BP (Non-Invasive): 139/60  MAP (Non-Invasive): 81 mmHG  Heart Rate: 87  Respiratory Rate: 18  Pain Score (READ ONLY): 8  SpO2: 98 %    Current Medications:  atorvastatin (LIPITOR) tablet, 20 mg, Oral, QPM  cefTRIAXone (ROCEPHIN) 2 g in NS 50 mL IVPB minibag, 2 g, Intravenous, Q24H  dapagliflozin (FARXIGA) tablet, 5 mg, Oral, Daily  escitalopram (LEXAPRO) tablet, 10 mg, Oral, Daily  famotidine (PEPCID) tablet, 20 mg, Oral, Daily  lactobacillus acidoph-bulgar (FLORANEX) 1 million cell tablet, 1 Tablet, Oral, 2x/day-Food  levalbuterol (XOPENEX) 1.25 mg/ 3 mL nebulizer solution, 1.25 mg, Nebulization, Q4H PRN  lidocaine-menthol (LIDOPATCH) 3.6%-1.25% patch, 2 Patch, Transdermal, Daily  linagliptin (TRADJENTA) tablet, 5 mg, Oral, Daily  losartan (COZAAR) tablet, 25 mg, Oral, Daily  morphine 4 mg/mL injection, 2 mg, Intravenous, Q4H PRN  NS flush syringe, 10 mL, Intravenous, Q8H  NS flush syringe, 10 mL, Intravenous, Q1H PRN  NS premix infusion, , Intravenous, Continuous  oxyCODONE-acetaminophen (PERCOCET) 5-350m per tablet, 2 Tablet, Oral, Q4H PRN  oxyCODONE-acetaminophen (PERCOCET) 5-3258mper tablet, 1 Tablet, Oral, Q4H PRN  polyethylene glycol (MIRALAX) oral packet, 17 g, Oral, 2x/day  prochlorperazine (COMPAZINE) 5 mg/mL injection, 10 mg, Intravenous, Q6H PRN  sennosides-docusate sodium (  SENOKOT-S) 8.6-50mg per tablet, 1 Tablet, Oral, 2x/day  sodium bicarbonate tablet, 650 mg, Oral, 2x/day  SSIP insulin R human 100 units/mL injection, 1-12 Units, Subcutaneous, 4x/day AC      Today's Physical Exam:   General: appearsmorbidly obese and acutely ill but in no acute distress.  Eyes: Pupils equal and round  HENT: Head atraumatic and normocephalic  Neck: No JVD  Lungs: CTAB, non labored breathing, no rales or wheezing.   Cardiovascular: regular rate and rhythm,   Abdomen:morbidly obese, soft,surgically tender as expected;incision CDI with dermabond,bowel sounds normal  Extremities: extremities normal, atraumatic, no cyanosis or edema  Skin: Skin warm and dry  Neurologic: Grossly normal  Psychiatric: Normal affect, behavior    I/O:  I/O last 24 hours:      Intake/Output Summary (Last 24 hours) at 03/28/2020 0725  Last data filed at 03/28/2020 0400  Gross per 24 hour   Intake 2392.9 ml   Output 1300 ml   Net 1092.9 ml     I/O current shift:  No intake/output data recorded.      Labs  Please indicate ordered or reviewed)  I have reviewed all labs for the past 24 hours; see Epic for full results        Radiology Tests (Please indicate ordered or reviewed)  Interpreted by radiologist and individually reviewed by me:     See Epic        Problem List:  Active Hospital Problems   (*Primary Problem)    Diagnosis   . 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    Ozella Rocks, NP

## 2020-03-28 NOTE — Progress Notes (Signed)
[facility]  PROGRESS NOTE        Subjective:  Febrile this morning with work-up in progress. Non-oliguric. Having some nausea and vomiting, but improving overall.       Objective:    Filed Vitals:    03/27/20 2002 03/28/20 0026 03/28/20 0330 03/28/20 0437   BP: (!) 141/65 129/67  139/60   Pulse: 88 82  87   Resp: 18 18 16 18    Temp: 36.5 C (97.7 F) 37.1 C (98.8 F)  36.7 C (98.1 F)   SpO2: 98% 95%  98%         Intake/Output Summary (Last 24 hours) at 03/28/2020 0853  Last data filed at 03/28/2020 0400  Gross per 24 hour   Intake 2392.9 ml   Output 1000 ml   Net 1392.9 ml       General: Well-appearing, no acute distress  HEENT: Anicteric, no ocular discharge, mucous membranes moist.  Neck:  No apparent JVD.  Cardiac: Regular rate and rhythm. No appreciable murmur, rub, or gallop.   Lungs: Clear to auscultation. No wheezing, rhonchi, or rales.   Extremities: No edema.  Neuro: Alert and oriented.  Skin: Warm and dry, no apparent rashes.  Psych: Mood and affect appropriate.     Medications  atorvastatin (LIPITOR) tablet 20 mg QPM  .  cefTRIAXone (ROCEPHIN) 2 g in NS 50 mL IVPB minibag 2 g Q24H  .  dapagliflozin (FARXIGA) tablet 5 mg Daily  .  escitalopram (LEXAPRO) tablet 10 mg Daily  .  famotidine (PEPCID) tablet 20 mg Daily  .  lactobacillus acidoph-bulgar (FLORANEX) 1 million cell tablet 1 Tablet 2x/day-Food  .  lidocaine-menthol [facility]) 3.6%-1.25% patch 2 Patch Daily  .  linagliptin (TRADJENTA) tablet 5 mg Daily  .  losartan (COZAAR) tablet 25 mg Daily  .  NS flush syringe 10 mL Q8H  .  polyethylene glycol (MIRALAX) oral packet 17 g 2x/day  .  sennosides-docusate sodium (SENOKOT-S) 8.6-50mg  per tablet 1 Tablet 2x/day  .  sodium bicarbonate tablet 650 mg 2x/day  .  SSIP insulin R human 100 units/mL injection 1-12 Units 4x/day AC    .  acetaminophen (TYLENOL) tablet, 650 mg, Q6H PRN  .  bisacodyl (DULCOLAX) rectal suppository, 10 mg, Daily PRN  .  levalbuterol (XOPENEX) 1.25 mg/ 3 mL nebulizer  solution, 1.25 mg, Q4H PRN  .  morphine 4 mg/mL injection, 2 mg, Q4H PRN  .  NS flush syringe, 10 mL, Q1H PRN  .  oxyCODONE-acetaminophen (PERCOCET) 5-325mg  per tablet, 2 Tablet, Q4H PRN  .  oxyCODONE-acetaminophen (PERCOCET) 5-325mg  per tablet, 1 Tablet, Q4H PRN  .  prochlorperazine (COMPAZINE) 5 mg/mL injection, 10 mg, Q6H PRN    NS premix infusion, , Last Rate: 100 mL/hr at 03/28/20 0037    Recent Labs     03/26/20  0744 03/27/20  0242 03/28/20  0533   SODIUM 130* 131* 137   POTASSIUM 4.6 4.4 4.3   CHLORIDE 103 102 108   CO2 18* 20* 19*   BUN 61* 59* 56*   CREATININE 3.12* 3.12* 2.86*   GLUCOSENF 210* 186* 172*   ANIONGAP 9 9 10    GFR 17* 17* 18*   CALCIUM 9.1 8.6 8.8   PHOSPHORUS 4.3  --   --    ALBUMIN 2.7*  --   --    WBC 8.5 10.6 6.6   HGB 9.7* 9.1* 8.3*   HCT 29.0* 26.6* 24.3*   PLTCNT 170 175 169  Assessment:  # AKI on CKD stage 3B, secondary to nephrectomy. Renal function improved today.  # Hyponatremia. Resolved  # Non-anion gap metabolic acidosis. Likely due to AKI/CKD.   # Hypertension: Controlled at present.   # Fever. Atelectasis noted. Cultures negative so far    Plan  1. Stop IV fluids  2. Patient to follow-up with Dr. Judeth Horn on discharge. Per his request, at time of discharge, she should have the following medication adjustments.  a. Losartan 25 mg daily as only BP medication. No diuretic or amlodipine  b. Tradjenta 5mg  daily. Will need new prescription.  c. No Farxiga at discharge. Resume Jardiance 10 mg daily.  d. Famotidine 20 mg daily. No pantoprazole.       Algis Liming, MD

## 2020-03-28 NOTE — Progress Notes (Signed)
Urology Progress note           [name] is a 54 y.o. female POD#4 s/p L laparoscopic radical nephrectomy.  Some appropriate incisional pain, but feeling better overall.  C/o constipation.    BP 139/60   Pulse 87   Temp 36.7 C (98.1 F)   Resp 18   Ht 1.702 m (5\' 7" )   Wt 129 kg (285 lb)   LMP 10/24/2014 (Approximate)   SpO2 98%   BMI 44.64 kg/m     Renal function improved, cultures NTD     PE: Incisions c/d/i, mild TTP diffuse abdomen    POD #4 s/p L HALN, improving  - Continue ambulation, pulmonary toilet, pain control  - Anticipate that she will meet discharge criteria tomorrow barring unexpected events      Ruta Hinds, MD

## 2020-03-29 DIAGNOSIS — Z483 Aftercare following surgery for neoplasm: Secondary | ICD-10-CM

## 2020-03-29 LAB — CBC W/AUTO DIFF
BASOPHIL #: 0 10*3/uL (ref 0.00–0.10)
BASOPHIL %: 0 % (ref 0–3)
EOSINOPHIL #: 0.1 10*3/uL (ref 0.00–0.50)
EOSINOPHIL %: 2 % (ref 0–5)
HCT: 24.4 % — ABNORMAL LOW (ref 36.0–45.0)
HGB: 8.3 g/dL — ABNORMAL LOW (ref 12.0–15.5)
LYMPHOCYTE #: 1.1 10*3/uL (ref 1.00–4.80)
LYMPHOCYTE %: 18 % (ref 15–43)
MCH: 29.8 pg (ref 27.5–33.2)
MCHC: 34.2 g/dL (ref 32.0–36.0)
MCV: 87.2 fL (ref 82.0–97.0)
MONOCYTE #: 0.6 10*3/uL (ref 0.20–0.90)
MONOCYTE %: 11 % (ref 5–12)
MPV: 8.1 fL (ref 7.4–10.5)
NEUTROPHIL #: 4.2 10*3/uL (ref 1.50–6.50)
NEUTROPHIL %: 69 % (ref 43–76)
PLATELETS: 184 10*3/uL (ref 150–450)
RBC: 2.8 10*6/uL — ABNORMAL LOW (ref 4.00–5.10)
RDW: 14.3 % (ref 11.0–16.0)
WBC: 6.1 10*3/uL (ref 4.0–11.0)

## 2020-03-29 LAB — BASIC METABOLIC PANEL
ANION GAP: 8 mmol/L (ref 4–13)
BUN/CREA RATIO: 19 (ref 6–22)
BUN: 48 mg/dL — ABNORMAL HIGH (ref 8–25)
CALCIUM: 9.4 mg/dL (ref 8.5–10.0)
CHLORIDE: 110 mmol/L (ref 96–111)
CO2 TOTAL: 21 mmol/L — ABNORMAL LOW (ref 22–30)
CREATININE: 2.52 mg/dL — ABNORMAL HIGH (ref 0.60–1.05)
ESTIMATED GFR: 21 mL/min/BSA — ABNORMAL LOW (ref >=60)
GLUCOSE: 146 mg/dL — ABNORMAL HIGH (ref 65–125)
POTASSIUM: 3.7 mmol/L (ref 3.5–5.1)
SODIUM: 139 mmol/L (ref 136–145)

## 2020-03-29 MED ORDER — PROCHLORPERAZINE EDISYLATE 10 MG/2ML IJ SOLN
10.00 mg | INTRAMUSCULAR | Status: DC
Start: ? — End: 2020-03-29

## 2020-03-29 MED ORDER — LINAGLIPTIN 5 MG PO TABS
5.00 mg | ORAL_TABLET | ORAL | Status: DC
Start: 2020-03-30 — End: 2020-03-29

## 2020-03-29 MED ORDER — LEVALBUTEROL HCL 1.25 MG/3ML IN NEBU
1.25 mg | INHALATION_SOLUTION | RESPIRATORY_TRACT | Status: DC
Start: ? — End: 2020-03-29

## 2020-03-29 MED ORDER — ACETAMINOPHEN 325 MG PO TABS
650.00 mg | ORAL_TABLET | ORAL | Status: DC
Start: ? — End: 2020-03-29

## 2020-03-29 MED ORDER — ESCITALOPRAM OXALATE 10 MG PO TABS
10.00 mg | ORAL_TABLET | ORAL | Status: DC
Start: 2020-03-30 — End: 2020-03-29

## 2020-03-29 MED ORDER — DAPAGLIFLOZIN PROPANEDIOL 5 MG PO TABS
5.00 mg | ORAL_TABLET | ORAL | Status: DC
Start: 2020-03-30 — End: 2020-03-29

## 2020-03-29 MED ORDER — POLYETHYLENE GLYCOL 3350 17 GM/SCOOP PO POWD
17.00 g | ORAL | Status: DC
Start: 2020-03-29 — End: 2020-03-29

## 2020-03-29 MED ORDER — SENNOSIDES-DOCUSATE SODIUM 8.6-50 MG PO TABS
1.00 | ORAL_TABLET | ORAL | Status: DC
Start: 2020-03-29 — End: 2020-03-29

## 2020-03-29 MED ORDER — SODIUM CHLORIDE FLUSH 0.9 % IV SOLN
10.00 mL | INTRAVENOUS | Status: DC
Start: ? — End: 2020-03-29

## 2020-03-29 MED ORDER — BISACODYL 10 MG RE SUPP
10.00 mg | RECTAL | Status: DC
Start: ? — End: 2020-03-29

## 2020-03-29 MED ORDER — FAMOTIDINE 20 MG PO TABS
20.00 mg | ORAL_TABLET | ORAL | Status: DC
Start: 2020-03-30 — End: 2020-03-29

## 2020-03-29 MED ORDER — LOSARTAN POTASSIUM 25 MG PO TABS
25.00 mg | ORAL_TABLET | ORAL | Status: DC
Start: 2020-03-30 — End: 2020-03-29

## 2020-03-29 MED ORDER — FLORANEX PO TABS
1.00 | ORAL_TABLET | ORAL | Status: DC
Start: 2020-03-29 — End: 2020-03-29

## 2020-03-29 MED ORDER — ATORVASTATIN CALCIUM 40 MG PO TABS
20.00 mg | ORAL_TABLET | ORAL | Status: DC
Start: 2020-03-29 — End: 2020-03-29

## 2020-03-29 MED ORDER — SODIUM CHLORIDE FLUSH 0.9 % IV SOLN
10.00 mL | INTRAVENOUS | Status: DC
Start: 2020-03-29 — End: 2020-03-29

## 2020-03-29 MED ORDER — SODIUM BICARBONATE 650 MG PO TABS
650.00 mg | ORAL_TABLET | ORAL | Status: DC
Start: 2020-03-29 — End: 2020-03-29

## 2020-03-29 MED ORDER — INSULIN REGULAR HUMAN 100 UNIT/ML IJ SOLN
1.00 [IU] | INTRAMUSCULAR | Status: DC
Start: 2020-03-29 — End: 2020-03-29

## 2020-03-29 MED ORDER — LINAGLIPTIN 5 MG TABLET
5.00 mg | ORAL_TABLET | Freq: Every day | ORAL | 0 refills | Status: DC
Start: 2020-03-30 — End: 2020-04-26

## 2020-03-29 MED ORDER — FAMOTIDINE 20 MG TABLET
20.00 mg | ORAL_TABLET | Freq: Every day | ORAL | 0 refills | Status: AC
Start: 2020-03-30 — End: 2020-04-29

## 2020-03-29 MED ORDER — LACTOBACILLUS ACIDOPH-L.BULGARICUS 1 MILLION CELL TABLET
1.00 | ORAL_TABLET | Freq: Two times a day (BID) | ORAL | 2 refills | Status: AC
Start: 2020-03-29 — End: 2020-06-27

## 2020-03-29 MED ORDER — OXYCODONE-ACETAMINOPHEN 5 MG-325 MG TABLET
1.00 | ORAL_TABLET | ORAL | 0 refills | Status: DC | PRN
Start: 2020-03-29 — End: 2020-06-15

## 2020-03-29 MED ORDER — LOSARTAN 25 MG TABLET: 25.0000 mg | ORAL_TABLET | Freq: Every day | ORAL | 0 refills | Status: AC

## 2020-03-29 NOTE — Discharge Summary (Signed)
[facility]    DISCHARGE SUMMARY      PATIENT NAME:  [name], [name]  MRN:  [id number]  DOB:  [birth year]    ENCOUNTER START DATE:  03/24/2020  INPATIENT ADMISSION DATE: 03/24/2020  DISCHARGE DATE:  03/29/2020    ATTENDING PHYSICIAN: Ruta Hinds, MD  PRIMARY CARE PHYSICIAN: Ocie Doyne, NP     ADMISSION DIAGNOSIS: Clear cell carcinoma of left kidney (CMS HCC)  No chief complaint on file.      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 Principle Problem: Clear cell carcinoma of left kidney (CMS HCC) [C64.2] 10/09/2017   . AKI (acute kidney injury) (CMS [place]) [N17.9] 10/08/2017   . Essential hypertension [I10] 11/30/2014   . Type 2 diabetes mellitus (CMS [place]) [E11.9] 10/25/2014      Resolved Hospital Problems   No resolved problems to display.     Active Non-Hospital Problems    Diagnosis Date Noted   . Persistent vomiting 08/20/2019   . Morbid obesity 10/24/2017   . Hyperkalemia 10/08/2017   . Slow transit constipation 10/08/2017   . C. difficile diarrhea 12/02/2014   . Obesity 11/30/2014   . Cellulitis of foot, left 11/29/2014   . Diabetic foot infection (CMS [place]) 10/24/2014      Patient Active Problem List   Diagnosis Code   . Diabetic foot infection (CMS HCC) E11.628, L08.9   . Type 2 diabetes mellitus (CMS HCC) E11.9   . Cellulitis of foot, left L03.116   . Essential hypertension I10   . Obesity E66.9   . C. difficile diarrhea A04.72   . Hyperkalemia E87.5   . AKI (acute kidney injury) (CMS [facility]) N17.9   . Slow transit constipation K59.01   . 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 Morbid obesity E66.01   . Persistent vomiting R11.15       Allergies   Allergen Reactions   . Vicodin [Hydrocodone-Acetaminophen] Itching            DISCHARGE MEDICATIONS:     Current Discharge Medication List      START taking these medications.      Details   famotidine 20 mg Tablet  Commonly known as: PEPCID   Start taking on: March 30, 2020   20 mg, Oral, DAILY  Qty: 30 Tablet  Refills: 0     lactobacillus 1 million cell Tablet  Commonly known as: FLORANEX   1 Tablet, Oral, 2 TIMES DAILY WITH FOOD  Qty: 60 Tablet  Refills: 2     linaGLIPtin 5 mg Tablet  Commonly known as: TRADJENTA  Start taking on: March 30, 2020   5 mg, Oral, DAILY  Qty: 30 Tablet  Refills: 0     losartan 25 mg Tablet  Commonly known as: COZAAR  Start taking on: March 30, 2020   25 mg, Oral, DAILY  Qty: 30 Tablet  Refills: 0        CONTINUE these medications which have CHANGED during your visit.      Details   tiotropium bromide 18 mcg Capsule, w/Inhalation Device  Commonly known as: SPIRIVA HANDIHALER  What changed:    when to take this   reasons to take this   18 mcg, Inhalation, DAILY  Qty: 14 Cap  Refills: 0        CONTINUE these  medications - NO CHANGES were made during your visit.      Details   albuterol sulfate 90 mcg/actuation HFA Aerosol Inhaler  Commonly known as: PROVENTIL or VENTOLIN or PROAIR   2 Puffs, Inhalation, EVERY 4 HOURS PRN  Qty: 2 Inhaler  Refills: 0     ascorbic acid (vitamin C) 500 mg Tablet  Commonly known as: VITAMIN C   500 mg, Oral, DAILY  Refills: 0     atorvastatin 20 mg Tablet  Commonly known as: LIPITOR   20 mg, Oral, EVERY EVENING  Refills: 0     cholecalciferol (Vitamin D3) 10 mcg/mL (400 unit/mL) Drops  Commonly known as: VI-D-SOL   2,000 Units, Oral, DAILY  Refills: 0     Claritin 10 mg Tablet  Generic drug: loratadine   10 mg, Oral, DAILY PRN  Refills: 0     empagliflozin 10 mg Tablet  Commonly known as: JARDIANCE   10 mg, Oral, DAILY  Refills: 0     escitalopram oxalate 10 mg Tablet  Commonly known as: LEXAPRO   10 mg, Oral, NIGHTLY  Refills: 0     Fish Oil-Omega-3 Fatty Acids 360-1,200 mg Capsule   2 Capsules, Oral, 2 TIMES DAILY  Refills: 0     fluticasone propionate 50 mcg/actuation Spray, Suspension  Commonly known as: FLONASE   1 Spray, Each Nostril, DAILY  Refills: 0     loperamide 2 mg Capsule  Commonly known  as: IMODIUM   2 mg, Oral, EVERY 4 HOURS PRN  Refills: 0     magnesium oxide 400 mg Tablet  Commonly known as: MAG-OX   250 mg, Oral, DAILY  Refills: 0            DISCHARGE INSTRUCTIONS:      DISCHARGE INSTRUCTION - DIET     Diet: RESUME HOME DIET      DISCHARGE INSTRUCTION - ACTIVITY     Activity: NO DRIVING UNTIL PAIN FREE AND OFF PAIN MEDICATIONS    Activity: NO HEAVY LIFTING FOR 6 WEEKS      DISCHARGE INSTRUCTION - INCISION/WOUND CARE     Instructions for incision/wound care: CLEAN INCISION WITH SOAP AND WATER    Instructions for incision/wound care: MAY SHOWER, NO TUB BATHS OR SWIMMING      FOLLOW-UP: UROLOGY - [facility] - [place], [place]     Follow-up in: 6 WEEKS 4-6 weeks   Reason for visit: POST-OP VISIT      Follow-up Information     Urology, Medical Office Building [facility] .    Specialty: Urology  Contact information:  [place] [id number]  [phone]                 REASON FOR HOSPITALIZATION AND HOSPITAL COURSE:  This is a 54 y.o., female who was admitted the morning of surgery and underwent an uncomplicated laparoscopic left radical nephrectomy for a renal mass confirmed by biopsy to be renal cell carcinoma.  The patient recovered overnight with IVF, bedrest, and pain control.  She was able to ambulate without assistance, tolerate PO, and control pain with oral mediations only after a few days. Her renal function worsened more than expected after this surgery, but was improving at the time of discharge.  H/H was also stable at the time of discharge.  She developed fever on POD #2 and was treated with Abx and pulmonary toilet for suspected atelectasis.  No other source was identified  and cultures were negative.    SIGNIFICANT PHYSICAL FINDINGS: None  SIGNIFICANT LAB: Renal function & CBC  SIGNIFICANT RADIOLOGY: CXR showed atelectasis  CONSULTATIONS: Nephrology & hospitalist consultations appreciated  PROCEDURES PERFORMED: Left hand-assisted  laparoscopic radical nephrectomy        [place]: as above    DOES PATIENT HAVE ADVANCED DIRECTIVES:  No, Information Offered and Refused    ADVANCED CARE PLANNING - Not applicable for this patient    CONDITION ON DISCHARGE: Alert, Oriented and VS Stable    DISCHARGE DISPOSITION:  Home discharge     Copies sent to Care Team       Relationship Specialty Notifications Start End    Ocie Doyne, NP PCP - General EXTERNAL  08/15/19     Phone: [phone] Fax: [phone]         [place] [address]

## 2020-03-29 NOTE — Nurses Notes (Signed)
Patient discharged home with family.  AVS reviewed with patient/care giver.  A written copy of the AVS and discharge instructions was given to the patient/care giver.  Questions sufficiently answered as needed.  Patient/care giver encouraged to follow up with PCP as indicated.  In the event of an emergency, patient/care giver instructed to call 911 or go to the nearest emergency room.

## 2020-03-29 NOTE — Progress Notes (Signed)
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 IV tylenol scheduled.     7/4 no fevers overnight.     7/5 No fevers overnight    Chief Complaint:follow up biopsy results    Subjective:Patient is alert and oriented x4. Painful to abdomen but patient states it is better. Incision site is intact and no S/S infection.H/H stable at 8.3/24.3.  Patient afebrile currently. Lungs diminished, no coarse crackles or wheezing to auscultation. Need aggressive pulmonary toileting measures.No chest pain, SOB. Lungs clear to auscultation. HRR.No questions or concerns at this time.Patient ready to go home. Recommendations below. Thank you for consult.    Assessment/ Plan:    Fever - resolving  -spiked 101.2 7/3  - lactic acid 0.7  -BC x 2 pendingno growth 2 days  -respiratory consult  -will add Xopenex   -BNP ordered  -IV fluids discontinued  -CXR showed atelectasis  -aggressive pulmonary toileting measures  -IV rocephin initiated  -CRP 153.5 and procalcitonin 0.79    HTN: Well controlled.Discontinue Amlodipine and diuretic and agree with Dr. Judeth Horn and keep Losartan 25 mg daily    GQ:BVQX home Po meds. Will order Sliding scale and increased   Agree with Dr. Judeth Horn to start tradjenta 5 mg daily and no farxiga at discharge and ok to restart Jardiance 10 mg/day    S/p L nephrectomy.Mngmt per Dr. Kerin Perna Janene Harvey control (added lidocaine patches)  H/H 9.7/29.0 to be expected post surgery.    CKD III/hyperkalemia, acute/mild hyponatremia:Dr. Welchfollowing  Creatinine 3.12, GFR 17   D/C'd lokelma per Dr. Judeth Horn        Overall PLAN  Can D/C rocephin -recommendation to place on abx at discharge to cover as procalcitonin was elevated.  Nebulizer on D/C PRN.  F/u with PCP, urology, nephrology.   Discharge will be at discretion of admitting provider.       Vital Signs:  Temp (24hrs) Max:36.7 C (98.1 F)      Temperature: 36.6 C (97.9 F)  BP (Non-Invasive): (!) 150/72  MAP (Non-Invasive): 91 mmHG  Heart Rate: 85  Respiratory Rate: 18  Pain Score (READ ONLY): 8  SpO2: 100 %    Current Medications:  acetaminophen (TYLENOL) tablet, 650 mg, Oral, Q6H PRN  atorvastatin (LIPITOR) tablet, 20 mg, Oral, QPM  bisacodyl (DULCOLAX) rectal suppository, 10 mg, Rectal, Daily PRN  cefTRIAXone (ROCEPHIN) 2 g in NS 50 mL IVPB minibag, 2 g, Intravenous, Q24H  dapagliflozin (FARXIGA) tablet, 5 mg, Oral, Daily  escitalopram (LEXAPRO) tablet, 10 mg, Oral, Daily  famotidine (PEPCID) tablet, 20 mg, Oral, Daily  lactobacillus acidoph-bulgar (FLORANEX) 1 million cell tablet, 1 Tablet, Oral, 2x/day-Food  levalbuterol (XOPENEX) 1.25 mg/ 3 mL nebulizer solution, 1.25 mg, Nebulization, Q4H PRN  lidocaine-menthol (LIDOPATCH) 3.6%-1.25% patch, 2 Patch, Transdermal, Daily  linagliptin (TRADJENTA) tablet, 5 mg, Oral, Daily  losartan (COZAAR) tablet, 25 mg, Oral, Daily  morphine 4 mg/mL injection, 2 mg, Intravenous, Q4H PRN  NS flush syringe, 10 mL, Intravenous, Q8H  NS flush syringe,  10 mL, Intravenous, Q1H PRN  oxyCODONE-acetaminophen (PERCOCET) 5-3104m per tablet, 2 Tablet, Oral, Q4H PRN  oxyCODONE-acetaminophen (PERCOCET) 5-3285mper tablet, 1 Tablet, Oral, Q4H PRN  polyethylene glycol (MIRALAX) oral packet, 17 g, Oral, 2x/day  prochlorperazine (COMPAZINE) 5 mg/mL injection, 10 mg, Intravenous, Q6H PRN  sennosides-docusate sodium (SENOKOT-S) 8.6-50mg per tablet, 1 Tablet, Oral, 2x/day  sodium bicarbonate tablet, 650 mg, Oral, 2x/day  SSIP insulin R human 100 units/mL injection, 1-12 Units, Subcutaneous, 4x/day AC        Today's  Physical Exam:   General: appearsmorbidly obese and acutely ill but in no acute distress.  Eyes: Pupils equal and round  HENT: Head atraumatic and normocephalic  Neck: No JVD  Lungs: CTAB, non labored breathing, no rales or wheezing.   Cardiovascular: regular rate and rhythm,  Abdomen:morbidly obese, soft,surgically tender as expected;incision CDI with dermabond,bowel sounds normal  Extremities: extremities normal, atraumatic, no cyanosis or edema  Skin: Skin warm and dry  Neurologic: Grossly normal  Psychiatric: Normal affect, behavior    I/O:  I/O last 24 hours:      Intake/Output Summary (Last 24 hours) at 03/29/2020 0925  Last data filed at 03/29/2020 0300  Gross per 24 hour   Intake 780 ml   Output 300 ml   Net 480 ml     I/O current shift:  No intake/output data recorded.      Labs  Please indicate ordered or reviewed)  I have reviewed all labs for the past 24 hours; see Epic for full results        Radiology Tests (Please indicate ordered or reviewed)  Interpreted by radiologist and individually reviewed by me:     See Epic        Problem List:  Active Hospital Problems   (*Primary Problem)    Diagnosis   . [facility]    S/p left laparoscopic partial nephrectomy 10/24/2017             Disposition:   Discharge will be pending test results, need for further testing, or further complications.    JuOzella RocksNP

## 2020-03-29 NOTE — Care Plan (Signed)
VSS and afebrile. Abdominal/side Incisional sites X3 CDI well approximated w/ minimal bruising to periphery. Pt Cheerful and talkative start of shift. C/o incisional Pain N3. PRN Tylonel admin X1 w/ relief. Pt then slept comfortably through care overnight.  Pt independent to bathroom. Voiding clear yellow urine.  BS active X4Q, Had BM 7/4. Lungs clear-diminished Sating 98-100 in RA. IV soft patent SL infusing IV Abx per MAR.

## 2020-03-29 NOTE — Care Plan (Signed)
Problem: Adult Inpatient Plan of Care  Goal: Plan of Care Review  Outcome: Adequate for Discharge  Goal: Patient-Specific Goal (Individualized)  Outcome: Adequate for Discharge  Goal: Absence of Hospital-Acquired Illness or Injury  Outcome: Adequate for Discharge  Goal: Optimal Comfort and Wellbeing  Outcome: Adequate for Discharge  Goal: Rounds/Family Conference  Outcome: Adequate for Discharge     Problem: Fall Injury Risk  Goal: Absence of Fall and Fall-Related Injury  Outcome: Adequate for Discharge     Problem: Bleeding (Surgery Nonspecified)  Goal: Absence of Bleeding  Outcome: Adequate for Discharge     Problem: Bowel Motility Impaired (Surgery Nonspecified)  Goal: Effective Bowel Elimination  Outcome: Adequate for Discharge     Problem: Infection (Surgery Nonspecified)  Goal: Absence of Infection Signs and Symptoms  Outcome: Adequate for Discharge     Problem: Ongoing Anesthesia Effects (Surgery Nonspecified)  Goal: Anesthesia/Sedation Recovery  Outcome: Adequate for Discharge     Problem: Pain (Surgery Nonspecified)  Goal: Acceptable Pain Control  Outcome: Adequate for Discharge     Problem: Postoperative Urinary Retention (Surgery Nonspecified)  Goal: Effective Urinary Elimination  Outcome: Adequate for Discharge     Problem: Adjustment to Illness (Chronic Kidney Disease)  Goal: Optimal Coping with Chronic Illness  Outcome: Adequate for Discharge     Problem: Electrolyte Imbalance (Chronic Kidney Disease)  Goal: Electrolyte Balance  Outcome: Adequate for Discharge     Problem: Fluid Volume Excess (Chronic Kidney Disease)  Goal: Fluid Balance  Outcome: Adequate for Discharge     Problem: Functional Decline (Chronic Kidney Disease)  Goal: Optimal Functional Ability  Outcome: Adequate for Discharge     Problem: Hematologic Alteration (Chronic Kidney Disease)  Goal: Absence of Anemia Signs/Symptoms  Outcome: Adequate for Discharge     Problem: Oral Intake Inadequate (Chronic Kidney Disease)  Goal: Optimal  Oral Intake  Outcome: Adequate for Discharge     Problem: Renal Function Impairment (Chronic Kidney Disease)  Goal: Laboratory Values and Blood Pressure Within Desired Range  Outcome: Adequate for Discharge

## 2020-03-29 NOTE — Progress Notes (Signed)
[facility]  PROGRESS NOTE        Subjective:  Feeling well. No nauesa, vomiting, or dysuria.       Objective:    Filed Vitals:    03/28/20 2036 03/28/20 2130 03/29/20 0430 03/29/20 0900   BP: (!) 152/78  (!) 147/64 (!) 150/72   Pulse: 85  76 85   Resp: 18 16 18 18    Temp: 36.7 C (98.1 F)  36.4 C (97.6 F) 36.6 C (97.9 F)   SpO2: 100%  98% 100%         Intake/Output Summary (Last 24 hours) at 03/29/2020 0935  Last data filed at 03/29/2020 0300  Gross per 24 hour   Intake 780 ml   Output 300 ml   Net 480 ml       General: Well-appearing, no acute distress  Lungs: Breathing comfortably on room air  Neuro: Alert and oriented.  Skin: Warm and dry, no apparent rashes.  Psych: Mood and affect appropriate.     Medications  atorvastatin (LIPITOR) tablet 20 mg QPM  .  cefTRIAXone (ROCEPHIN) 2 g in NS 50 mL IVPB minibag 2 g Q24H  .  dapagliflozin (FARXIGA) tablet 5 mg Daily  .  escitalopram (LEXAPRO) tablet 10 mg Daily  .  famotidine (PEPCID) tablet 20 mg Daily  .  lactobacillus acidoph-bulgar (FLORANEX) 1 million cell tablet 1 Tablet 2x/day-Food  .  lidocaine-menthol [facility]) 3.6%-1.25% patch 2 Patch Daily  .  linagliptin (TRADJENTA) tablet 5 mg Daily  .  losartan (COZAAR) tablet 25 mg Daily  .  NS flush syringe 10 mL Q8H  .  polyethylene glycol (MIRALAX) oral packet 17 g 2x/day  .  sennosides-docusate sodium (SENOKOT-S) 8.6-50mg  per tablet 1 Tablet 2x/day  .  sodium bicarbonate tablet 650 mg 2x/day  .  SSIP insulin R human 100 units/mL injection 1-12 Units 4x/day AC    .  acetaminophen (TYLENOL) tablet, 650 mg, Q6H PRN  .  bisacodyl (DULCOLAX) rectal suppository, 10 mg, Daily PRN  .  levalbuterol (XOPENEX) 1.25 mg/ 3 mL nebulizer solution, 1.25 mg, Q4H PRN  .  morphine 4 mg/mL injection, 2 mg, Q4H PRN  .  NS flush syringe, 10 mL, Q1H PRN  .  oxyCODONE-acetaminophen (PERCOCET) 5-325mg  per tablet, 2 Tablet, Q4H PRN  .  oxyCODONE-acetaminophen (PERCOCET) 5-325mg  per tablet, 1 Tablet, Q4H PRN  .   prochlorperazine (COMPAZINE) 5 mg/mL injection, 10 mg, Q6H PRN        Recent Labs     03/27/20  0242 03/28/20  0533 03/29/20  0751   SODIUM 131* 137 139   POTASSIUM 4.4 4.3 3.7   CHLORIDE 102 108 110   CO2 20* 19* 21*   BUN 59* 56* 48*   CREATININE 3.12* 2.86* 2.52*   GLUCOSENF 186* 172* 146*   ANIONGAP 9 10 8    GFR 17* 18* 21*   CALCIUM 8.6 8.8 9.4   WBC 10.6 6.6 6.1   HGB 9.1* 8.3* 8.3*   HCT 26.6* 24.3* 24.4*   PLTCNT 175 169 184               Assessment:  # AKI on CKD stage 3B, secondary to nephrectomy. Renal function continues to improve  # Non-anion gap metabolic acidosis. Likely due to AKI/CKD.   # Hypertension: Mildly elevated, but acceptable. Further medication adjustment to be made as an outpatient.   # Fever. Atelectasis noted. Cultures negative so far    Plan  1. Patient to  follow-up with Dr. Judeth Horn on discharge. Per his request, at time of discharge, she should have the following medication adjustments.  a. Losartan 25 mg daily as only BP medication. No diuretic or amlodipine  b. Tradjenta 5mg  daily. Will need new prescription.  c. No Farxiga at discharge. Resume Jardiance 10 mg daily.  d. Famotidine 20 mg daily. No pantoprazole.       Algis Liming, MD

## 2020-03-30 ENCOUNTER — Telehealth (INDEPENDENT_AMBULATORY_CARE_PROVIDER_SITE_OTHER): Payer: Self-pay | Admitting: Urology

## 2020-03-30 NOTE — Telephone Encounter (Addendum)
Message routed to Dr. Kerin Perna:    Patient was d/c from hospital yesterday.  Patient contacted office because Tradjenta and Lactobacillus was not at pharmacy.  Per pharmacy Lactobacillus is OTC and Tradjenta requires prior authorization.  Epic shows that you prescribed the Tradjenta?  I don't have enough documentation to get authorization on this medication.  Can you give me more information?Galen Manila, LPN  09/28/7090, 95:74      Ruta Hinds, MD  Galen Manila, LPN  Nephrology wanted to change some doses of medications she was already on. She should contact her PCP to review these changes and get the necessary prescriptions.      Patient notified and agreeable.Galen Manila, LPN  03/27/4036, 09:64

## 2020-03-30 NOTE — Care Management Notes (Signed)
Discharge confirmation; complete.

## 2020-03-31 ENCOUNTER — Telehealth (INDEPENDENT_AMBULATORY_CARE_PROVIDER_SITE_OTHER): Payer: Self-pay | Admitting: Urology

## 2020-03-31 ENCOUNTER — Emergency Department
Admission: EM | Admit: 2020-03-31 | Discharge: 2020-03-31 | Disposition: A | Discharge status: Home / Self Care | Payer: 59 | Attending: Emergency Medicine | Admitting: Emergency Medicine

## 2020-03-31 ENCOUNTER — Other Ambulatory Visit: Payer: Self-pay

## 2020-03-31 ENCOUNTER — Encounter (HOSPITAL_COMMUNITY): Payer: Self-pay

## 2020-03-31 ENCOUNTER — Emergency Department (HOSPITAL_COMMUNITY): Payer: 59

## 2020-03-31 DIAGNOSIS — R609 Edema, unspecified: Secondary | ICD-10-CM | POA: Insufficient documentation

## 2020-03-31 DIAGNOSIS — M79605 Pain in left leg: Secondary | ICD-10-CM | POA: Insufficient documentation

## 2020-03-31 DIAGNOSIS — Z9889 Other specified postprocedural states: Secondary | ICD-10-CM | POA: Insufficient documentation

## 2020-03-31 DIAGNOSIS — M79604 Pain in right leg: Secondary | ICD-10-CM | POA: Insufficient documentation

## 2020-03-31 DIAGNOSIS — R109 Unspecified abdominal pain: Secondary | ICD-10-CM | POA: Insufficient documentation

## 2020-03-31 LAB — CBC WITH DIFF
BASOPHIL #: 0 10*3/uL (ref 0.00–0.10)
BASOPHIL %: 0 % (ref 0–3)
EOSINOPHIL #: 0.1 10*3/uL (ref 0.00–0.50)
EOSINOPHIL %: 1 % (ref 0–5)
HCT: 26.4 % — ABNORMAL LOW (ref 36.0–45.0)
HGB: 9 g/dL — ABNORMAL LOW (ref 12.0–15.5)
LYMPHOCYTE #: 0.8 10*3/uL — ABNORMAL LOW (ref 1.00–4.80)
LYMPHOCYTE %: 13 % — ABNORMAL LOW (ref 15–43)
MCH: 29.7 pg (ref 27.5–33.2)
MCHC: 34 g/dL (ref 32.0–36.0)
MCV: 87.4 fL (ref 82.0–97.0)
MONOCYTE #: 0.4 10*3/uL (ref 0.20–0.90)
MONOCYTE %: 6 % (ref 5–12)
MPV: 8.3 fL (ref 7.4–10.5)
NEUTROPHIL #: 5 10*3/uL (ref 1.50–6.50)
NEUTROPHIL %: 79 % — ABNORMAL HIGH (ref 43–76)
PLATELETS: 241 10*3/uL (ref 150–450)
RBC: 3.02 10*6/uL — ABNORMAL LOW (ref 4.00–5.10)
RDW: 14.2 % (ref 11.0–16.0)
WBC: 6.3 10*3/uL (ref 4.0–11.0)

## 2020-03-31 LAB — COMPREHENSIVE METABOLIC PANEL, NON-FASTING
ALBUMIN: 2.8 g/dL — ABNORMAL LOW (ref 3.5–5.0)
ALKALINE PHOSPHATASE: 74 U/L (ref 50–130)
ALT (SGPT): 17 U/L (ref 8–22)
ANION GAP: 9 mmol/L (ref 4–13)
AST (SGOT): 21 U/L (ref 8–45)
BILIRUBIN TOTAL: 0.3 mg/dL (ref 0.3–1.3)
BUN/CREA RATIO: 17 (ref 6–22)
BUN: 40 mg/dL — ABNORMAL HIGH (ref 8–25)
CALCIUM: 9.6 mg/dL (ref 8.5–10.0)
CHLORIDE: 107 mmol/L (ref 96–111)
CO2 TOTAL: 23 mmol/L (ref 22–30)
CREATININE: 2.39 mg/dL — ABNORMAL HIGH (ref 0.60–1.05)
ESTIMATED GFR: 23 mL/min/BSA — ABNORMAL LOW (ref >=60)
GLUCOSE: 219 mg/dL — ABNORMAL HIGH (ref 65–125)
POTASSIUM: 4.1 mmol/L (ref 3.5–5.1)
PROTEIN TOTAL: 6.6 g/dL (ref 6.4–8.3)
SODIUM: 139 mmol/L (ref 136–145)

## 2020-03-31 LAB — URINALYSIS WITH MICROSCOPIC REFLEX IF INDICATED BMC/JMC ONLY
BILIRUBIN: NEGATIVE mg/dL
GLUCOSE: >=1000 mg/dL — AB
KETONES: NEGATIVE mg/dL
LEUKOCYTES: NEGATIVE WBCs/uL
NITRITE: NEGATIVE
PH: 6 (ref <8.0)
PROTEIN: 30 mg/dL — AB
SPECIFIC GRAVITY: 1.02 (ref <1.022)
UROBILINOGEN: 0.2 mg/dL (ref <=2.0)

## 2020-03-31 LAB — URINALYSIS, MICROSCOPIC

## 2020-03-31 LAB — PT/INR
INR: 1.08
PROTHROMBIN TIME: 12.5 seconds (ref 9.4–12.5)

## 2020-03-31 LAB — PTT (PARTIAL THROMBOPLASTIN TIME): APTT: 22.4 seconds — ABNORMAL LOW (ref 25.1–36.5)

## 2020-03-31 MED ORDER — ONDANSETRON HCL (PF) 4 MG/2 ML INJECTION SOLUTION
4.00 mg | INTRAMUSCULAR | Status: AC
Start: 2020-03-31 — End: 2020-03-31
  Administered 2020-03-31: 4 mg via INTRAVENOUS
  Filled 2020-03-31: qty 2

## 2020-03-31 NOTE — ED Nurses Note (Signed)
This RN reviewed discharge instructions with the pt and allowed time for questions. Pt is now discharged home and advised to return to the ED if symptoms worsen. No new questions noted at this time.        Medication List      ASK your doctor about these medications    . albuterol sulfate 90 mcg/actuation HFA Aerosol Inhaler; Commonly known   as: PROVENTIL or VENTOLIN or PROAIR; Take 2 Puffs by inhalation Every 4   hours as needed  . ascorbic acid (vitamin C) 500 mg Tablet; Commonly known as: VITAMIN C  . atorvastatin 20 mg Tablet; Commonly known as: LIPITOR  . cholecalciferol (Vitamin D3) 10 mcg/mL (400 unit/mL) Drops; Commonly   known as: VI-D-SOL  . Claritin 10 mg Tablet; Generic drug: loratadine  . empagliflozin 10 mg Tablet; Commonly known as: JARDIANCE  . escitalopram oxalate 10 mg Tablet; Commonly known as: LEXAPRO  . famotidine 20 mg Tablet; Commonly known as: PEPCID; Take 1 Tablet (20 mg   total) by mouth Once a day for 30 days  . Fish Oil-Omega-3 Fatty Acids 360-1,200 mg Capsule  . fluticasone propionate 50 mcg/actuation Spray, Suspension; Commonly   known as: FLONASE  . lactobacillus 1 million cell Tablet; Commonly known as: FLORANEX; Take 1   Tablet by mouth Twice daily with food for 90 days  . linaGLIPtin 5 mg Tablet; Commonly known as: TRADJENTA; Take 1 Tablet (5   mg total) by mouth Once a day for 30 days  . loperamide 2 mg Capsule; Commonly known as: IMODIUM  . losartan 25 mg Tablet; Commonly known as: COZAAR; Take 1 Tablet (25 mg   total) by mouth Once a day for 30 days  . magnesium oxide 400 mg Tablet; Commonly known as: MAG-OX  . oxyCODONE-acetaminophen 5-325 mg Tablet; Commonly known as: PERCOCET;   Take 1 Tablet by mouth Every 4 hours as needed for Pain  . tiotropium bromide 18 mcg Capsule, w/Inhalation Device; Commonly known   as: SPIRIVA HANDIHALER; Take 1 Cap (18 mcg total) by inhalation Once a day   for 14 days

## 2020-03-31 NOTE — ED Nurses Note (Signed)
Pt ambulated to the restroom and provided a urine sample.

## 2020-03-31 NOTE — Telephone Encounter (Addendum)
Patient had nephrectomy 03/24/20.  She contacted the office with c/o severe pain and bilateral leg swelling.  One leg is so swollen and painful she can not walk.  Patient was crying on the phone due to the pain.  I explained that Dr. Kerin Perna is in the OR today and recommended she go to ER. Patient is agreeable and is going to ER now.Galen Manila, LPN  12/31/927, 57:47      Ruta Hinds, MD  Galen Manila, LPN  That's the right thing to do.

## 2020-03-31 NOTE — ED Provider Notes (Shared)
Dr. Morene Crocker        [facility], [facility]          Emergency Department Visit Note    Date of Service: 03/31/2020  Room: ER28/28  Time Seen: 18:31  Primary Care Doctor: Ocie Doyne, NP  History given by: patient    HPI:  The patient is a(an) 54 y.o. female who presents to the ED with Flank Pain  Discharged from hospital on Monday from nephrectomy. Now with nausea and pain and leg swelling. Feeling of tight sock on the left foot. No hx of DVT in past. No chest pain or SOB. No fever. Pain present to left side adjacent to the incision sight. Emesis once while coming here. Normal urine output. Normal BM.     Prior to Admission medications    Medication Sig Start Date End Date Taking? Authorizing Provider   albuterol sulfate (PROVENTIL OR VENTOLIN OR PROAIR) 90 mcg/actuation Inhalation HFA Aerosol Inhaler Take 2 Puffs by inhalation Every 4 hours as needed 08/22/19   Justice Rocher, MD   ascorbic acid, vitamin C, (VITAMIN C) 500 mg Oral Tablet Take 500 mg by mouth Once a day     Provider, Historical   atorvastatin (LIPITOR) 20 mg Oral Tablet Take 20 mg by mouth Every evening    Provider, Historical   cholecalciferol, Vitamin D3, (VI-D-SOL) 10 mcg/mL (400 unit/mL) Oral Drops Take 2,000 Units by mouth Once a day     Provider, Historical   empagliflozin (JARDIANCE) 10 mg Oral Tablet Take 10 mg by mouth Once a day  01/20/20 01/19/21  Provider, Historical   escitalopram oxalate (LEXAPRO) 10 mg Oral Tablet Take 10 mg by mouth Every night    Provider, Historical   famotidine (PEPCID) 20 mg Oral Tablet Take 1 Tablet (20 mg total) by mouth Once a day for 30 days 03/30/20 04/29/20  Ruta Hinds, MD   Fish Oil-Omega-3 Fatty Acids 360-1,200 mg Oral Capsule Take 2 Caps by mouth Twice daily    Provider, Historical   fluticasone (FLONASE) 50 mcg/actuation Nasal Spray, Suspension 1 Spray by Each Nostril route Once a day    Provider, Historical   lactobacillus (FLORANEX) 1 million cell Oral Tablet Take 1 Tablet  by mouth Twice daily with food for 90 days 03/29/20 06/27/20  Ruta Hinds, MD   linaGLIPtin (TRADJENTA) 5 mg Oral Tablet Take 1 Tablet (5 mg total) by mouth Once a day for 30 days 03/30/20 04/29/20  Ruta Hinds, MD   loperamide (IMODIUM) 2 mg Oral Capsule Take 2 mg by mouth Every 4 hours as needed    Provider, Historical   loratadine (CLARITIN) 10 mg Oral Tablet Take 10 mg by mouth Once per day as needed     Provider, Historical   losartan (COZAAR) 25 mg Oral Tablet Take 1 Tablet (25 mg total) by mouth Once a day for 30 days 03/30/20 04/29/20  Ruta Hinds, MD   magnesium oxide (MAG-OX) 400 mg (241.3 mg magnesium) Oral Tablet Take 250 mg by mouth Once a day     Provider, Historical   oxyCODONE-acetaminophen (PERCOCET) 5-325 mg Oral Tablet Take 1 Tablet by mouth Every 4 hours as needed for Pain 03/29/20   Marthe Patch, MD   tiotropium bromide (SPIRIVA HANDIHALER) 18 mcg Inhalation Capsule, w/Inhalation Device Take 1 Cap (18 mcg total) by inhalation Once a day for 14 days  Patient taking differently: Take 18 mcg by inhalation Once per day as needed  08/23/19 03/24/20  Justice Rocher, MD  Allergies   Allergen Reactions   . Vicodin [Hydrocodone-Acetaminophen] Itching     Past Medical History:  Past Medical History:   Diagnosis Date   . Anxiety    . Arthritis    . Cellulitis 10/24/14    left foot   . 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 Depression    . Diabetes mellitus (CMS [place])    . Essential hypertension    . Fatty liver    . Headache(784.0)    . History of nephrectomy 03/24/2020    L nephrectomy   . Hyperlipidemia    . Irritable bowel syndrome    . Macular edema    . Morbid obesity (CMS [place])    . Peripheral neuropathy     bilateral feet   . Type 2 diabetes mellitus (CMS HCC)    . White coat hypertension        Past Surgical History:  Past Surgical History:   Procedure Laterality Date   . Ectopic pregnancy surgery  1999   . Hx cataract removal Bilateral    . Hx cholecystectomy  09/10/96   . Hx  pelvic laparoscopy  05/24/98   . Hx tubal ligation  09/10/2012   . Hymenectomy  1996   . Laparoscopic partial nephrectomy Left 10/24/2017   . Renal biopsy, percutaneous  03/08/2020       Social History:  Social History     Tobacco Use   . Smoking status: Never Smoker   . Smokeless tobacco: Never Used   Substance Use Topics   . Alcohol use: No   . Drug use: No     Social History     Substance and Sexual Activity   Drug Use No       Family History:  Family History   Problem Relation Age of Onset   . Hypertension (High Blood Pressure) Maternal Uncle    . Cancer Maternal Grandmother    . Diabetes Father    . Hypertension (High Blood Pressure) Father    . Kidney Cancer Neg Hx    . Bladder Cancer Neg Hx    . Prostate Cancer Neg Hx    . Testicular Cancer Neg Hx        ROS:  All other review of systems negative.    PE:  Filed Vitals:    03/31/20 0953 03/31/20 0954 03/31/20 1400   BP: (!) 180/90 (!) 180/90 (!) 187/99   Pulse: 92     Resp: 18     Temp: 37.1 C (98.8 F)     SpO2: 98% 96%      Constitutional: . Appears well-developed in no acute distress.Marland Kitchen   HENT: No facial injuries or swelling.  Head: Atraumatic.   Nose: No rhinorrhea or epistaxis..   Mouth/Throat: Lips and mucosa well hydrated.   Eyes: Extra Ocular Movements are normal. Pupils are equal, round, and reactive to light.   Neck: No thyroid enlargement.  Cardiovascular: Normal rate and regular rhythm.    Pulmonary/Chest: No accessory muscle usage. No respiratory distress.   Abdominal: Bowel sounds are normal. There is tenderness to the region lateral to the left abdomen which showed incision site to have no evidence of infection..   Extremities: normal range of motion. No deformity.  Work the left leg shows a relative increase in girth as compared to the right.  She has some tenderness to the posterior aspects of that left calf and posterior knee.  There are no  palpable cords.  Neurological: Alert with sensory motor intact.  Skin: No rash noted. No cyanosis. No  discoloration.  Psychiatric: Normal mood and affect. Behavior is normal.       Labs    Labs Reviewed   COMPREHENSIVE METABOLIC PANEL, NON-FASTING - Abnormal; Notable for the following components:       Result Value    BUN 40 (*)     CREATININE 2.39 (*)     ESTIMATED GFR 23 (*)     ALBUMIN 2.8 (*)     GLUCOSE 219 (*)     All other components within normal limits    Narrative:     Hemolysis can alter results at this level (slight).  Hemolysis can alter results at this level (slight).   URINALYSIS WITH MICROSCOPIC REFLEX IF INDICATED [facility] ONLY - Abnormal; Notable for the following components:    PROTEIN 30  (*)     GLUCOSE >=1000 (*)     BLOOD Moderate (*)     All other components within normal limits   PTT (PARTIAL THROMBOPLASTIN TIME) - Abnormal; Notable for the following components:    APTT 22.4 (*)     All other components within normal limits    Narrative:     Therapeutic range for unfractionated heparin is 50.0-90.0 seconds.     CBC WITH DIFF - Abnormal; Notable for the following components:    RBC 3.02 (*)     HGB 9.0 (*)     HCT 26.4 (*)     NEUTROPHIL % 79 (*)     LYMPHOCYTE % 13 (*)     LYMPHOCYTE # 0.80 (*)     All other components within normal limits   URINALYSIS, MICROSCOPIC - Abnormal; Notable for the following components:    RBCS 5-10 (*)     BACTERIA Slight (*)     All other components within normal limits   PT/INR - Normal    Narrative:     Coumadin therapy INR range for Conventional Anticoagulation is 2.0 to 3.0 and for Intensive Anticoagulation 2.5 to 3.5.   CBC/DIFF    Narrative:     The following orders were created for panel order CBC/DIFF.  Procedure                               Abnormality         Status                     ---------                               -----------         ------                     CBC WITH CVUD[[id number]]                Abnormal            Final result                 Please view results for these tests on the individual orders.     Results for orders placed or  performed during the hospital encounter of 03/31/20 (from the past 24 hour(s))   CBC/DIFF    Narrative    The following orders were created for panel order CBC/DIFF.  Procedure                               Abnormality         Status                     ---------                               -----------         ------                     CBC WITH WLSL[[id number]]                Abnormal            Final result                 Please view results for these tests on the individual orders.   COMPREHENSIVE METABOLIC PANEL, NON-FASTING   Result Value Ref Range    SODIUM 139 136 - 145 mmol/L    POTASSIUM 4.1 3.5 - 5.1 mmol/L    CHLORIDE 107 96 - 111 mmol/L    CO2 TOTAL 23 22 - 30 mmol/L    ANION GAP 9 4 - 13 mmol/L    BUN 40 (H) 8 - 25 mg/dL    CREATININE 2.39 (H) 0.60 - 1.05 mg/dL    BUN/CREA RATIO 17 6 - 22    ESTIMATED GFR 23 (L) >=60 mL/min/BSA    ALBUMIN 2.8 (L) 3.5 - 5.0 g/dL     CALCIUM 9.6 8.5 - 10.0 mg/dL    GLUCOSE 219 (H) 65 - 125 mg/dL    ALKALINE PHOSPHATASE 74 50 - 130 U/L    ALT (SGPT) 17 8 - 22 U/L    AST (SGOT)  21 8 - 45 U/L    BILIRUBIN TOTAL 0.3 0.3 - 1.3 mg/dL    PROTEIN TOTAL 6.6 6.4 - 8.3 g/dL    Narrative    Hemolysis can alter results at this level (slight).  Hemolysis can alter results at this level (slight).   URINALYSIS WITH MICROSCOPIC REFLEX IF INDICATED [facility] ONLY   Result Value Ref Range    COLOR Light Yellow Light Yellow, Straw, Yellow    APPEARANCE Clear Clear    PH 6.0 <8.0    LEUKOCYTES Negative Negative WBCs/uL    NITRITE Negative Negative    PROTEIN 30  (A) Negative, 10  mg/dL    GLUCOSE >=1000 (A) Negative mg/dL    KETONES Negative Negative mg/dL    UROBILINOGEN 0.2  <=2.0 mg/dL    BILIRUBIN Negative Negative mg/dL    BLOOD Moderate (A) Negative mg/dL    SPECIFIC GRAVITY 1.020 <1.022   PT/INR   Result Value Ref Range    PROTHROMBIN TIME 12.5 9.4 - 12.5 seconds    INR 1.08     Narrative    Coumadin therapy INR range for Conventional Anticoagulation is 2.0 to 3.0 and for Intensive  Anticoagulation 2.5 to 3.5.   PTT (PARTIAL THROMBOPLASTIN TIME)   Result Value Ref Range    APTT 22.4 (L) 25.1 - 36.5 seconds    Narrative    Therapeutic range for unfractionated heparin is 50.0-90.0 seconds.     CBC WITH DIFF   Result Value Ref Range    WBC 6.3 4.0 - 11.0 x10^3/uL  RBC 3.02 (L) 4.00 - 5.10 x10^6/uL    HGB 9.0 (L) 12.0 - 15.5 g/dL    HCT 26.4 (L) 36.0 - 45.0 %    MCV 87.4 82.0 - 97.0 fL    MCH 29.7 27.5 - 33.2 pg    MCHC 34.0 32.0 - 36.0 g/dL    RDW 14.2 11.0 - 16.0 %    PLATELETS 241 150 - 450 x10^3/uL    MPV 8.3 7.4 - 10.5 fL    NEUTROPHIL % 79 (H) 43 - 76 %    LYMPHOCYTE % 13 (L) 15 - 43 %    MONOCYTE % 6 5 - 12 %    EOSINOPHIL % 1 0 - 5 %    BASOPHIL % 0 0 - 3 %    NEUTROPHIL # 5.00 1.50 - 6.50 x10^3/uL    LYMPHOCYTE # 0.80 (L) 1.00 - 4.80 x10^3/uL    MONOCYTE # 0.40 0.20 - 0.90 x10^3/uL    EOSINOPHIL # 0.10 0.00 - 0.50 x10^3/uL    BASOPHIL # 0.00 0.00 - 0.10 x10^3/uL   URINALYSIS, MICROSCOPIC   Result Value Ref Range    RBCS 5-10 (A) 0 - 2 /hpf    WBCS 0-2 0 - 2 /hpf    BACTERIA Slight (A) None /hpf    SQUAMOUS EPITHELIAL 0-2 0 - 2 /hpf    TRANSITIONAL EPITHELIAL 0-2 0 - 2 /hpf       Imaging    CT abd pelvis:   1. Small postoperative hematomas in the left renal surgical bed.  2. Small amount of blood in the left abdominal wall subcutaneous tissues  and abdominal wall.    DVT US: No evidence for DVT in the lower extremity from the level of  the common femoral to popliteal veins bilaterally.    Medications given:  Medications   ondansetron (ZOFRAN) 2 mg/mL injection (4 mg Intravenous Given 03/31/20 1254)       Course and Disposition:    10:22 AM  The patient is going to undergo CT imaging to the assure that there is not some type of abscess forming adjacent to the incision site.  I doubt that this is the case she also have DVT studies done and will check her labs as well.    12:36 PM  This patient has had no change in her status.  I have informed her the findings on the workup.  I will  communicate with Dr. Kerin Perna the fact the patient is here in case he wants to see her.  I have not identified any emergent situation that requires further intervention.  She will likely be discharged home in the very near future.      1:55 PM  The evaluation was discussed with Dr.Whitman who was most concerned about DVTs and given the fact the studies were negative for DVT he felt the patient could be safely discharged back to home.  Patient is using heating pad the area of her flank pain and then to a continue with activities that as had been suggested prior to her coming here.  She is to contact Dr. Kerin Perna for any further problems.    Disposition: Discharged    Follow up:   Ocie Doyne, NP  [place] [address]  [phone]    In 1 week  As needed    Ruta Hinds, MD  [place] [address]  [phone]    Call in  3 days  As needed      Clinical Impression:     Encounter Diagnoses   Name Primary?   . Bilateral leg pain    . Flank pain Yes   . Edema        Future Appointments scheduled in [place]:   Future Appointments   Date Time Provider Filer   04/26/2020  9:00 AM Ruta Hinds, MD [facility]

## 2020-03-31 NOTE — ED Nurses Note (Signed)
Pt has two noted incisions, one small and one larger on her left lower abdomen. No redness or heat noted around the incisional area.

## 2020-03-31 NOTE — ED Triage Notes (Signed)
Pt had a left kidney removed last Wed, June 30th by Dr. Lambert Keto. Pt now experiencing moderate left sided flank pain.

## 2020-03-31 NOTE — ED Nurses Note (Signed)
Dr. Maricela Bo at bedside for initial evaluation.

## 2020-04-01 ENCOUNTER — Other Ambulatory Visit (RURAL_HEALTH_CENTER): Payer: Self-pay

## 2020-04-01 ENCOUNTER — Ambulatory Visit (HOSPITAL_COMMUNITY): Payer: Self-pay | Admitting: Urology

## 2020-04-01 LAB — ADULT ROUTINE BLOOD CULTURE, SET OF 2 BOTTLES (BACTERIA AND YEAST)
BLOOD CULTURE, ROUTINE: NO GROWTH
BLOOD CULTURE, ROUTINE: NO GROWTH

## 2020-04-01 MED ORDER — LINAGLIPTIN 5 MG PO TABS
5.0000 mg | ORAL_TABLET | Freq: Every day | ORAL | 3 refills | Status: DC
Start: 2020-04-01 — End: 2020-04-05

## 2020-04-01 NOTE — Telephone Encounter (Signed)
Patient called stating that she was is hospital yesterday. They discontinued her Jardiance and started her on Trajenta 5mg  qd. She said that Urologist tried sending it through for her but its not letting them and she was told to contact her PCP to have it sent to her pharmacy. RF request sent.

## 2020-04-05 ENCOUNTER — Ambulatory Visit: Payer: 59 | Attending: Gerontology | Admitting: Gerontology

## 2020-04-05 ENCOUNTER — Encounter (RURAL_HEALTH_CENTER): Payer: Self-pay | Admitting: Gerontology

## 2020-04-05 VITALS — BP 170/82 | HR 76 | Temp 97.5°F | Resp 16 | Ht 67.0 in | Wt 291.5 lb

## 2020-04-05 DIAGNOSIS — N184 Chronic kidney disease, stage 4 (severe): Secondary | ICD-10-CM

## 2020-04-05 DIAGNOSIS — Z09 Encounter for follow-up examination after completed treatment for conditions other than malignant neoplasm: Secondary | ICD-10-CM

## 2020-04-05 DIAGNOSIS — E1121 Type 2 diabetes mellitus with diabetic nephropathy: Secondary | ICD-10-CM

## 2020-04-05 DIAGNOSIS — Z905 Acquired absence of kidney: Secondary | ICD-10-CM

## 2020-04-05 DIAGNOSIS — C642 Malignant neoplasm of left kidney, except renal pelvis: Secondary | ICD-10-CM

## 2020-04-05 MED ORDER — LINAGLIPTIN 5 MG PO TABS
5.00 mg | ORAL_TABLET | Freq: Every day | ORAL | 3 refills | Status: AC
Start: 2020-04-05 — End: 2020-05-05

## 2020-04-05 NOTE — Progress Notes (Signed)
Subjective:     Patient ID: Kelli Carlson is a 54 y.o. female.  Chief Complaint   Patient presents with    Hospital Follow-up     post op        HPI: She is s/p left nephrectomy for clear cell carcinoma,(03/24/20) CKD and known diabetic, we are trying to get Trajenta approved for her type 2 DM as her CKD precludes the use of many oral agents    The following portions of the patient's history were reviewed and updated as appropriate: allergies, current medications, past family history, past medical history, past social history, past surgical history and problem list.    Social History     Socioeconomic History    Marital status: Married     Spouse name: Not on file    Number of children: Not on file    Years of education: Not on file    Highest education level: Not on file   Occupational History    Not on file   Tobacco Use    Smoking status: Never Smoker    Smokeless tobacco: Never Used   Vaping Use    Vaping Use: Never used   Substance and Sexual Activity    Alcohol use: No     Alcohol/week: 0.0 standard drinks    Drug use: No    Sexual activity: Yes   Other Topics Concern    Not on file   Social History Narrative    Not on file     Social Determinants of Health     Financial Resource Strain:     Difficulty of Paying Living Expenses:    Food Insecurity:     Worried About Charity fundraiser in the Last Year:     Arboriculturist in the Last Year:    Transportation Needs:     Film/video editor (Medical):     Lack of Transportation (Non-Medical):    Physical Activity:     Days of Exercise per Week:     Minutes of Exercise per Session:    Stress:     Feeling of Stress :    Social Connections:     Frequency of Communication with Friends and Family:     Frequency of Social Gatherings with Friends and Family:     Attends Religious Services:     Active Member of Clubs or Organizations:     Attends Music therapist:     Marital Status:    Intimate Partner Violence:     Fear  of Current or Ex-Partner:     Emotionally Abused:     Physically Abused:     Sexually Abused:      Review of Systems   Constitutional: Positive for fatigue.   HENT: Negative.    Gastrointestinal: Positive for abdominal pain.   Endocrine: Negative for polydipsia, polyphagia and polyuria.   Skin: Positive for wound.                       Objective:    Physical Exam  Vitals and nursing note reviewed.   Constitutional:       Appearance: Normal appearance.   HENT:      Head: Normocephalic.   Cardiovascular:      Rate and Rhythm: Normal rate.   Pulmonary:      Effort: Pulmonary effort is normal.   Abdominal:          Comments: Surgical scar,  no erythema, no drainage, no odor, she does report pruritis   Neurological:      Mental Status: She is alert. Mental status is at baseline.       Blood pressure 170/82, pulse 76, temperature 97.5 F (36.4 C), temperature source Temporal, resp. rate 16, height 1.702 m (5\' 7" ), weight 132.2 kg (291 lb 8 oz).          Wt Readings from Last 4 Encounters:   04/05/20 132.2 kg (291 lb 8 oz)   03/02/20 130.7 kg (288 lb 3.2 oz)   12/01/19 127 kg (280 lb)   08/29/19 127 kg (280 lb)       Medical Decision Making:   1. Clear cell carcinoma of left kidney  She is followed by nephrology, recnetly underwent left nephrectomy on 03/24/20 at [place], [facility] with Dr Ruta Hinds, records reviewed from [place]  2. S/p nephrectomy  - linaGLIPtin 5 MG Tab; Take 1 tablet (5 mg total) by mouth daily  Dispense: 30 tablet; Refill: 3  As above follows with urology and nephrology at [facility]  3. Stage 4 chronic kidney disease  - linaGLIPtin 5 MG Tab; Take 1 tablet (5 mg total) by mouth daily  Dispense: 30 tablet; Refill: 3  Follows with [facility], her most recent EGFR dated 03/31/20, is <23  4. Type 2 diabetes mellitus with diabetic nephropathy, without long-term current use of insulin  - linaGLIPtin 5 MG Tab; Take 1 tablet (5 mg total) by mouth daily  Dispense: 30 tablet; Refill: 3  Due to  her CKD and EGFR below 30, she was changed to Trajenta as this is the safest oral hyperglycemic treatment at this time, we will need to have prior authorization  5. Hospital discharge follow-up  We reviewed her hospital stay, surgical records and most recent ED visit to [facility], dated 03/31/20    No follow-ups on file.  Already scheduled with me and specialites      Follow up sooner if no improvement or if symptoms worsen or persist.   Medication dosing instructions, drug-drug interactions and potential side effects discussed in detail.   Failure to comply with plan of care discussed with patient. Importance of adhering to follow up office visits discussed in detail.   Potential risks of noncompliance discussed with patient.    Ocie Doyne, NP

## 2020-04-06 ENCOUNTER — Telehealth (RURAL_HEALTH_CENTER): Payer: Self-pay

## 2020-04-06 NOTE — Telephone Encounter (Signed)
Patient called stating that she just wanted to let you know that even after getting the PA approved for Tradjenta it is still requiring her to pay $300. She wanted to let you know that she is going to call Urology and see if there is something else that she can try that will not be as expensive.

## 2020-04-08 ENCOUNTER — Telehealth (RURAL_HEALTH_CENTER): Payer: Self-pay

## 2020-04-08 NOTE — Telephone Encounter (Signed)
Patient called back in about her medication I made her aware that a message was sent to brenda about the cost of her medication. Patient states that she has been off of medication for 10 days and her glucose has been running in the 300- and 400.

## 2020-04-08 NOTE — Telephone Encounter (Signed)
Patient called again this morning stating that she has not heard back from Urology. She would like to see if you would be able to change the Tradjenta to something less expensive? It is still going to cost over $300 after PA done.

## 2020-04-09 NOTE — Telephone Encounter (Signed)
She needs to also touch base with her Nephrologist and see what he recommends due to her having only one kidney and the risks involved with renal insufficiency

## 2020-04-09 NOTE — Telephone Encounter (Signed)
Spoke with patient and advised her that you recommend that she touch base with her Nephrologist and see what they recommend due to her only having one kidney and the risks involved with renal insufficiency. Patient verbalized understanding and will try calling them again.

## 2020-04-13 NOTE — Progress Notes (Signed)
[facility] MULTIDISCIPLINARY TUMOR BOARD DISCUSSION:    DATE:   04/15/2020    PATIENT:   [name]                                   MRN#:                       [id number]  DOB:    1966-05-25  AGE:   54 y.o.        PRESENTER: Dr Kerin Perna    BRIEF HISTORY:   54 year old man with history of pT1 a NX left kidney cancer diagnosed January 2019 status post partial nephrectomy with pathology that showed negative margins but evidence of capsular rupture.  Patient had residual mass at the surgical site that was initially believed to be postsurgical hematoma and was followed over time.  Due to persistence of this mass over the past 2 years, patient underwent MRI of abdomen on 02/04/2020 which shows a 2.8 x 2.8 cm mass in the left perinephric space with intense homogeneous enhancement although there was no change in size from prior study on 08/20/2019 and 11/04/2017.  Patient underwent CT IR guided biopsy on 03/08/2020.  Pathology showed clear cell renal cell carcinoma, grade 2. On 03/24/2020, patient underwent left nephrectomy with pathology as below.      TYPE OF PRESENTATION: Prospective  PATHOLOGY REVIEWED: Yes   RADIOGRAPHS REVIEWED: Yes       DIAGNOSIS: Recurrent clear cell renal cell carcinoma  AJCC STAGE:  pT3a,pNX  -margins were negative.  There was rhabdoid but not sarcomatoid features.  PROGNOSTIC INDICATORS DISCUSSED:   NATIONAL GUIDELINES DISCUSSED: Yes; NCCN      GUIDELINES BASED RECOMMENDATION    SURGERY:  Status post left nephrectomy.  Patient will be referred to Hematology Oncology for evaluation    HEMATOLOGY/ONCOLOGY:   -complete staging with chest CT scan  -observe but in the setting of prior capsular rupture and recurrence, advise on increase risk of recurrence and need for close surveillance      RADIATION ONCOLOGY:  No role    CLINICAL TRIALS REFERRAL:  Consider if available    GENETIC TESTING:  No            [name] 04/13/2020 10:52

## 2020-04-15 ENCOUNTER — Other Ambulatory Visit: Payer: Self-pay | Admitting: Hematology & Oncology

## 2020-04-26 ENCOUNTER — Ambulatory Visit (INDEPENDENT_AMBULATORY_CARE_PROVIDER_SITE_OTHER): Payer: 59 | Admitting: Urology

## 2020-04-26 ENCOUNTER — Encounter (INDEPENDENT_AMBULATORY_CARE_PROVIDER_SITE_OTHER): Payer: Self-pay | Admitting: Urology

## 2020-04-26 ENCOUNTER — Other Ambulatory Visit: Payer: Self-pay

## 2020-04-26 VITALS — BP 186/106 | HR 85 | Temp 97.7°F | Ht 67.0 in | Wt 292.0 lb

## 2020-04-26 DIAGNOSIS — C642 Malignant neoplasm of left kidney, except renal pelvis: Secondary | ICD-10-CM

## 2020-04-26 DIAGNOSIS — Z8616 Personal history of COVID-19: Secondary | ICD-10-CM

## 2020-04-26 NOTE — Assessment & Plan Note (Addendum)
Patient has healed well from surgery.  I will refer her to Medical Oncology as recommended during the tumor board.  She will be on a tight surveillance protocol.  Patient was encouraged to progressively increase her activity and to have no restrictions two weeks from now, which is six weeks from the surgery.

## 2020-04-26 NOTE — Progress Notes (Signed)
Urology Progress note      Chief Complaint   Patient presents with   . Post Op     6 week from nephrectomy        [name] is a 54 y.o. female following up, 4 weeks s/p left nephrectomy for recurrence of clear cell carcinoma of left kidney. The patient had a partial nephrectomy in January 2019. No additional treatment is recommended at this time per tumor board consultation. She states she's not having any pain and has healed well. The patient will continue her treatment with the oncology team at [facility] in [place].    The patient had Covid-19 earlier this year.    BP (!) 186/106   Pulse 85   Temp 36.5 C (97.7 F) (Thermal Scan)   Ht 1.702 m (5\' 7" )   Wt 132 kg (292 lb)   LMP 10/24/2014 (Approximate)   BMI 45.73 kg/m        PE: No acute distress. The patient appears well.      Clear cell carcinoma of left kidney (CMS [facility])  Patient has healed well from surgery.  I will refer her to Medical Oncology as recommended during the tumor board.  She will be on a tight surveillance protocol.  Patient was encouraged to progressively increase her activity and to have no restrictions two weeks from now, which is six weeks from the surgery.     Orders Placed This Encounter   . Refer to [facility]     Follow up in 1 year.    I am in the presence of and scribing for Dr. Ruta Hinds, MD, provided on 04/26/2020.  Caryl Pina, [place]  04/26/2020, 09:59    I personally performed the services described in this documentation, as scribed  in my presence, and it is both accurate  and complete.    Ruta Hinds, MD  04/26/2020, 15:31

## 2020-04-29 NOTE — Addendum Note (Signed)
Addended by: Kerin Perna, Demone Lyles J on: 04/29/2020 12:17 PM     Modules accepted: Orders

## 2020-05-03 ENCOUNTER — Ambulatory Visit
Admission: RE | Admit: 2020-05-03 | Discharge: 2020-05-03 | Disposition: A | Discharge status: Home / Self Care | Payer: 59 | Source: Ambulatory Visit | Attending: Nephrology | Admitting: Nephrology

## 2020-05-03 ENCOUNTER — Telehealth (HOSPITAL_COMMUNITY): Payer: Self-pay | Admitting: Internal Medicine

## 2020-05-03 DIAGNOSIS — D631 Anemia in chronic kidney disease: Secondary | ICD-10-CM | POA: Insufficient documentation

## 2020-05-03 DIAGNOSIS — E559 Vitamin D deficiency, unspecified: Secondary | ICD-10-CM | POA: Insufficient documentation

## 2020-05-03 DIAGNOSIS — N189 Chronic kidney disease, unspecified: Secondary | ICD-10-CM | POA: Insufficient documentation

## 2020-05-03 DIAGNOSIS — N2581 Secondary hyperparathyroidism of renal origin: Secondary | ICD-10-CM | POA: Insufficient documentation

## 2020-05-03 DIAGNOSIS — N1832 Chronic kidney disease, stage 3b: Secondary | ICD-10-CM | POA: Insufficient documentation

## 2020-05-03 DIAGNOSIS — E1129 Type 2 diabetes mellitus with other diabetic kidney complication: Secondary | ICD-10-CM | POA: Insufficient documentation

## 2020-05-03 DIAGNOSIS — C649 Malignant neoplasm of unspecified kidney, except renal pelvis: Secondary | ICD-10-CM | POA: Insufficient documentation

## 2020-05-03 DIAGNOSIS — R809 Proteinuria, unspecified: Secondary | ICD-10-CM | POA: Insufficient documentation

## 2020-05-03 DIAGNOSIS — E782 Mixed hyperlipidemia: Secondary | ICD-10-CM | POA: Insufficient documentation

## 2020-05-03 DIAGNOSIS — I10 Essential (primary) hypertension: Secondary | ICD-10-CM | POA: Insufficient documentation

## 2020-05-03 LAB — RENAL FUNCTION PANEL
Albumin: 3.2 gm/dL — ABNORMAL LOW (ref 3.5–5.0)
Anion Gap: 12.2 mMol/L (ref 7.0–18.0)
BUN / Creatinine Ratio: 10.6 Ratio (ref 10.0–30.0)
BUN: 34 mg/dL — ABNORMAL HIGH (ref 7–22)
CO2: 20.6 mMol/L (ref 20.0–30.0)
Calcium: 9.3 mg/dL (ref 8.5–10.5)
Chloride: 113 mMol/L — ABNORMAL HIGH (ref 98–110)
Creatinine: 3.21 mg/dL — ABNORMAL HIGH (ref 0.60–1.20)
EGFR: 16 mL/min/{1.73_m2} — ABNORMAL LOW (ref 60–150)
Glucose: 151 mg/dL — ABNORMAL HIGH (ref 71–99)
Osmolality Calculated: 292 mOsm/kg (ref 275–300)
Phosphorus: 4.3 mg/dL (ref 2.3–4.7)
Potassium: 4.8 mMol/L (ref 3.5–5.3)
Sodium: 141 mMol/L (ref 136–147)

## 2020-05-03 LAB — PTH, INTACT: PTH Intact: 320.9 pg/mL — ABNORMAL HIGH (ref 20.0–83.0)

## 2020-05-03 NOTE — Telephone Encounter (Signed)
05/19/20-NP/CLEAR CELL CARCINOMA OF L KIDNEY-REF DR Kerin Perna., NOTES IN EPIC

## 2020-05-04 LAB — VITAMIN D,25 OH,TOTAL: Vitamin D 25-Hydroxy: 19.4 ng/mL — ABNORMAL LOW (ref 30.0–80.0)

## 2020-05-19 ENCOUNTER — Encounter (HOSPITAL_COMMUNITY): Payer: Self-pay | Admitting: Internal Medicine

## 2020-05-19 ENCOUNTER — Other Ambulatory Visit: Payer: Self-pay

## 2020-05-19 ENCOUNTER — Ambulatory Visit: Payer: 59 | Attending: Internal Medicine | Admitting: Internal Medicine

## 2020-05-19 VITALS — BP 189/93 | HR 83 | Temp 97.1°F | Resp 18 | Ht 66.3 in | Wt 286.6 lb

## 2020-05-19 DIAGNOSIS — Z1211 Encounter for screening for malignant neoplasm of colon: Secondary | ICD-10-CM

## 2020-05-19 DIAGNOSIS — Z1231 Encounter for screening mammogram for malignant neoplasm of breast: Secondary | ICD-10-CM

## 2020-05-19 DIAGNOSIS — C642 Malignant neoplasm of left kidney, except renal pelvis: Secondary | ICD-10-CM | POA: Insufficient documentation

## 2020-05-19 NOTE — H&P (Signed)
New Patient History and Physical    Name: [name] MRN:  [id number]   Date: 05/19/2020 Age: 54 y.o.  DOB: 1966/01/19       Referring Physician: Ruta Hinds, MD  Primary Care Provider: Ocie Doyne, NP  Reason for visit/consultation New Patient (Clear Cell Carcinoma of Left Kidney )  Accompanied by: Comes in  with her daughter.   ____________________________________________________________________________________________________________________________________________  Heme/Onc Diagnosis: RCC-left kidney-diagnosed jan 2019-and then again in May 2021  Stage:  Initial tumor-pT1apNx  Now -recurrent tumor, staged pathologically as pT3apNx  Molecular/Special studies:  Clear cell carcinoma with rhabdoid features, grade 4  Risk Stratification:  High risk for recurrence considering the tumor extended into the perinephric capsule and had rhabdoid features-which are typically more aggressive.  Additionally initial RCC -as diagnosed in January 2019-had evidence of capsular rupture-raising concerns for tumor spillage.    Treatment goal:  Cure    Treatment/Surveillance:    1) S/p partial left nephrectomy January 2019  2) Status post complete left nephrectomy June 2021.     _______________________________________________________________________________________________________________________________________________  History of Present Illness:    This is a pleasant 54 year old Caucasian woman, who today comes in for initial medical oncology opinion-accompanied by her daughter for a recently discovered left RCC.  She is an she has no concerning symptoms to discuss today.  Her medical comorbidities consist of white coat hypertension, diabetes, type 2 neuropathy, and chronic kidney disease-CKD stage 5-that she is on the platform to receiving dialysis soon.      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  She had a residual mass at the surgical site-initially believed to be a postsurgical hematoma.  This was appropriately followed radiographically by her urologist.    02/04/2020- This mass however did not subside with time-and was further evaluated by MRI of the abdomen that was subsequently performed -it showed a 2.8 x 2.8 cm mass in the left perinephric space with intense homogeneous enhancement of, although there was no change in the size from prior study dated November 2020 and February 2019.    03/08/2020- She underwent IR CT-guided biopsy.  Pathology showed evidence of clear cell carcinoma, grade 2.    03/24/2020-she underwent left nephrectomy, AJCC staging pT3apNx.      ROS:   Review of Systems - Oncology  Otherwise a 10 point review of systems was discussed, the pertinent positive and negative symptoms are as per HPI. All other systems reviewed and are negative.    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) 10/09/2017    Depression     Diabetes mellitus (CMS HCC)     Essential hypertension     Fatty liver     Headache(784.0)     History of nephrectomy 03/24/2020    L nephrectomy    Hyperlipidemia     Irritable bowel syndrome     Macular edema     Morbid obesity (CMS HCC)     Peripheral neuropathy     bilateral feet    Type 2 diabetes mellitus (CMS HCC)     White coat hypertension          Past  Surgical History:   Procedure Laterality Date    ECTOPIC PREGNANCY SURGERY  1999    HX CATARACT REMOVAL Bilateral     HX CHOLECYSTECTOMY  09/10/96    HX PELVIC LAPAROSCOPY  05/24/98    Ectopic pregnancy    HX TUBAL LIGATION  09/10/2012    Filshie Clips    HYMENECTOMY  1996    LAPAROSCOPIC PARTIAL NEPHRECTOMY Left 10/24/2017    hand-assisted    RENAL BIOPSY, PERCUTANEOUS   03/08/2020    clear cell renal cell carcinoma         Allergies   Allergen Reactions    Vicodin [Hydrocodone-Acetaminophen] Itching     Current Outpatient Medications   Medication Sig    albuterol sulfate (PROVENTIL OR VENTOLIN OR PROAIR) 90 mcg/actuation Inhalation HFA Aerosol Inhaler Take 2 Puffs by inhalation Every 4 hours as needed    amLODIPine (NORVASC) 2.5 mg Oral Tablet Take 2.5 mg by mouth    ascorbic acid, vitamin C, (VITAMIN C) 500 mg Oral Tablet Take 500 mg by mouth Once a day     atorvastatin (LIPITOR) 20 mg Oral Tablet Take 20 mg by mouth Every evening    cholecalciferol, Vitamin D3, (VI-D-SOL) 10 mcg/mL (400 unit/mL) Oral Drops Take 2,000 Units by mouth Once a day     empagliflozin (JARDIANCE) 10 mg Oral Tablet Take 10 mg by mouth Once a day  (Patient not taking: Reported on 04/26/2020)    escitalopram oxalate (LEXAPRO) 10 mg Oral Tablet Take 10 mg by mouth Every night    Fish Oil-Omega-3 Fatty Acids 360-1,200 mg Oral Capsule Take 2 Caps by mouth Twice daily    fluticasone (FLONASE) 50 mcg/actuation Nasal Spray, Suspension 1 Spray by Each Nostril route Once a day    JANUVIA 50 mg Oral Tablet     lactobacillus (FLORANEX) 1 million cell Oral Tablet Take 1 Tablet by mouth Twice daily with food for 90 days    loperamide (IMODIUM) 2 mg Oral Capsule Take 2 mg by mouth Every 4 hours as needed    loratadine (CLARITIN) 10 mg Oral Tablet Take 10 mg by mouth Once per day as needed     losartan (COZAAR) 25 mg Oral Tablet Take 1 Tablet (25 mg total) by mouth Once a day for 30 days    magnesium oxide (MAG-OX) 400 mg (241.3 mg magnesium) Oral Tablet Take 250 mg by mouth Once a day     oxyCODONE-acetaminophen (PERCOCET) 5-325 mg Oral Tablet Take 1 Tablet by mouth Every 4 hours as needed for Pain (Patient not taking: Reported on 04/26/2020)    tiotropium bromide (SPIRIVA HANDIHALER) 18 mcg Inhalation Capsule, w/Inhalation Device Take 1 Cap (18 mcg total) by inhalation Once a day for 14 days (Patient  taking differently: Take 18 mcg by inhalation Once per day as needed )       Family History  Family Medical History:     Problem Relation (Age of Onset)    Cancer Maternal Grandmother    Diabetes Father    Hypertension (High Blood Pressure) Maternal Uncle, Father          Social History  Occupation:   Social History     Occupational History    Not on file    [place] [address]   reports that she has never smoked. She has never used smokeless tobacco. She reports being sexually active and has had partner(s) who are female. She reports that she does not drink alcohol and does not use  drugs.    Physical Examination:  Pulse 83    Temp 36.2 C (97.1 F) (Thermal Scan)    Resp 18    Ht 1.684 m (5' 6.3") Comment: * Current taken outpatient oncology   Wt 130 kg (286 lb 9.6 oz)    LMP 10/24/2014 (Approximate)    SpO2 99% Comment: ra   BMI 45.84 kg/m       ECOG Status: 1 - Restricted in physically strenuous activity, but ambulatory and able to carry out work of a light or sedentary nature, e.g., light housework, office work.  Physical Exam  Constitutional:       Appearance: She is obese.   HENT:      Nose: No congestion.      Mouth/Throat:      Mouth: Mucous membranes are moist.   Eyes:      General: No scleral icterus.     Conjunctiva/sclera: Conjunctivae normal.   Cardiovascular:      Rate and Rhythm: Normal rate and regular rhythm.      Heart sounds: No murmur heard.     Pulmonary:      Effort: No respiratory distress.      Breath sounds: No wheezing or rales.   Abdominal:      Palpations: Abdomen is soft. There is no mass.      Tenderness: There is no abdominal tenderness.   Musculoskeletal:         General: No swelling. Normal range of motion.      Cervical back: Normal range of motion. No rigidity.   Skin:     Capillary Refill: Capillary refill takes less than 2 seconds.      Coloration: Skin is not jaundiced.      Findings: No rash.   Neurological:      General: No focal deficit present.      Mental  Status: She is alert. Mental status is at baseline.   Psychiatric:         Mood and Affect: Mood normal.        LABS:    Labs reviewed and interpreted:  Results for ADRIEL, DESROSIER (MRN [id number]) as of 05/24/2020 18:10   Ref. Range 03/31/2020 10:41   WBC Latest Ref Range: 4.0 - 11.0 x103/uL 6.3   HGB Latest Ref Range: 12.0 - 15.5 g/dL 9.0 (L)   HCT Latest Ref Range: 36.0 - 45.0 % 26.4 (L)   PLATELET COUNT Latest Ref Range: 150 - 450 x103/uL 241   RBC Latest Ref Range: 4.00 - 5.10 x106/uL 3.02 (L)   MCV Latest Ref Range: 82.0 - 97.0 fL 87.4   MCHC Latest Ref Range: 32.0 - 36.0 g/dL 34.0   MCH Latest Ref Range: 27.5 - 33.2 pg 29.7   RDW Latest Ref Range: 11.0 - 16.0 % 14.2   MPV Latest Ref Range: 7.4 - 10.5 fL 8.3   PMN'S Latest Ref Range: 43 - 76 % 79 (H)   LYMPHOCYTES Latest Ref Range: 15 - 43 % 13 (L)   EOSINOPHIL Latest Ref Range: 0 - 5 % 1   MONOCYTES Latest Ref Range: 5 - 12 % 6   BASOPHILS Latest Ref Range: 0 - 3 % 0   PMN ABS Latest Ref Range: 1.50 - 6.50 x103/uL 5.00   LYMPHS ABS Latest Ref Range: 1.00 - 4.80 x103/uL 0.80 (L)   EOS ABS Latest Ref Range: 0.00 - 0.50 x103/uL 0.10   MONOS ABS Latest Ref Range: 0.20 - 0.90 x103/uL 0.40  BASOS ABS Latest Ref Range: 0.00 - 0.10 x103/uL 0.00   SODIUM Latest Ref Range: 136 - 145 mmol/L 139   POTASSIUM Latest Ref Range: 3.5 - 5.1 mmol/L 4.1   CHLORIDE Latest Ref Range: 96 - 111 mmol/L 107   CARBON DIOXIDE Latest Ref Range: 22 - 30 mmol/L 23   BUN Latest Ref Range: 8 - 25 mg/dL 40 (H)   CREATININE Latest Ref Range: 0.60 - 1.05 mg/dL 2.39 (H)   GLUCOSE Latest Ref Range: 65 - 125 mg/dL 219 (H)   ANION GAP Latest Ref Range: 4 - 13 mmol/L 9   BUN/CREAT RATIO Latest Ref Range: 6 - 22  17   ESTIMATED GLOMERULAR FILTRATION RATE Latest Ref Range: >=60 mL/min/BSA 23 (L)   CALCIUM Latest Ref Range: 8.5 - 10.0 mg/dL 9.6   TOTAL PROTEIN Latest Ref Range: 6.4 - 8.3 g/dL 6.6   ALBUMIN Latest Ref Range: 3.5 - 5.0 g/dL  2.8 (L)   BILIRUBIN, TOTAL Latest Ref Range: 0.3 - 1.3  mg/dL 0.3   AST (SGOT) Latest Ref Range: 8 - 45 U/L 21   ALT (SGPT) Latest Ref Range: 8 - 22 U/L 17   ALKALINE PHOSPHATASE Latest Ref Range: 50 - 130 U/L 74     Pathology:  Slide [facility] at [facility]: yes  Final Diagnosis   LEFT KIDNEY, RADICAL NEPHRECTOMY:  -          Recurrent clear cell renal cell carcinoma (see comment and protocol).     Electronically signed by Camillia Herter, MD on 03/25/2020 at 1441   Synoptic Report   KIDNEY: Nephrectomy  8th Edition - Protocol posted: 11/20/2018  KIDNEY: NEPHRECTOMY, PARTIAL OR RADICAL - All Specimens  SPECIMEN   Procedure  Radical nephrectomy    Specimen Laterality  Left    TUMOR   Tumor Site  Perinephric adipose near middle pole    Histologic Type  Clear cell renal cell carcinoma    Histologic Grade  G4: Extreme nuclear pleomorphism and / or multi-nuclear giant cells and / or rhabdoid and / or sarcomatoid differentiation    Tumor Size  Greatest Dimension (Centimeters): 3.5 cm   Tumor Focality  Unifocal    Tumor Extension  Tumor extension into perinephric tissue (beyond renal capsule)    Sarcomatoid Features  Not identified    Rhabdoid Features  Present    Tumor Necrosis  Not identified    Lymphovascular Invasion  Not identified    MARGINS   Margins  Uninvolved by invasive carcinoma    LYMPH NODES   Regional Lymph Nodes  No lymph nodes submitted or found    PATHOLOGIC STAGE CLASSIFICATION (pTNM, AJCC 8th Edition)   TNM Descriptors  r (recurrent)    Primary Tumor (pT)  pT3a    Regional Lymph Nodes (pN)  pNX    ADDITIONAL FINDINGS   Pathologic Findings in Nonneoplastic Kidney  Glomerular disease: Nodular and global glomerulosclerosis    .      Diagnosis Comment    The tumor is located in the perinephric adipose tissue adjacent to the prior partial nephrectomy site and focally adherent to the renal capsule.  The interpretation is that of recurrent renal cell carcinoma.  Most of the histology is grade 2 as previously reported; however, one area (block A4) shows significant  pleomorphism and rhabdoid change consistent with grade 4       Radiology:  All available imaging studies relevant to this consultation reviewed    ______________________________________________________________________________________________________________________________  Assessment/Plan: [name] is a 54 y.o. female     1. Clear cell carcinoma of left kidney (CMS HCC)    - Refer to [facility]  - CT CHEST WO IV CONTRAST; Future    -discussed with her that at this time she does not meet the criteria requiring any adjuvant chemotherapy/targeted therapy.    -Discussed the high-risk features of her tumor-and discussed and agreed upon the need for close radiographic and clinical surveillance.    -Will obtain a CT of the chest without IV contrast now-to exclude pulmonary metastases.  -future studies would have to be without contrast-in the setting of her end-stage renal disease.    2. Encounter for screening mammogram for breast cancer  - MAMMO BILATERAL SCREENING-ADDL VIEWS/BREAST US AS REQ BY RAD; Future    3. Encounter for screening colonoscopy  - Refer to [facility] [employer]; Future    Supportive services  A. Pain: No pain  B. Wellbeing: No psychological issues  C. Smoking cessation: Former smoker  D. End of life discussion: Not on palliative treatment    RTC 4 months.    A total of 60 minutes was spent on this consultation with >50% spent in education, counseling of patient on diagnosis and treatment and in coordination of care.  All questions presented by patient on this visit were satisfactorily answered.    Luz Lex, MD    CC:  Ocie Doyne, NP  [place] [address]

## 2020-05-19 NOTE — Progress Notes (Signed)
Research Note   New patient visit noted. Chart accessed and screened for clinical trial eligibility. Hx of clear cell carcinoma of left kidney noted with plans for careful surveillance per Dr. Charlett Lango note from 04-26-2020, will forgo trial consideration at this time.

## 2020-05-20 ENCOUNTER — Telehealth (INDEPENDENT_AMBULATORY_CARE_PROVIDER_SITE_OTHER): Payer: Self-pay | Admitting: Urology

## 2020-05-20 ENCOUNTER — Encounter (HOSPITAL_COMMUNITY): Payer: Self-pay | Admitting: Internal Medicine

## 2020-05-20 NOTE — Telephone Encounter (Signed)
Patient l/m in v/m requesting to speak with Dr. Kerin Perna.  She was seen by oncology today and she has some concerns that she would like to discuss with Dr. Kerin Perna.  Message routed to Dr. Kerin Perna.  I also sent a detailed message via staff message.Galen Manila, LPN  8/65/7846, 96:29

## 2020-05-21 ENCOUNTER — Telehealth (INDEPENDENT_AMBULATORY_CARE_PROVIDER_SITE_OTHER): Payer: Self-pay | Admitting: Urology

## 2020-05-21 NOTE — Telephone Encounter (Signed)
Patient called returning a missed call from Dr Kerin Perna. Patient requests that Dr Kerin Perna call her back after 1:30pm today.   [name]  05/21/2020, 09:53

## 2020-05-24 ENCOUNTER — Encounter (HOSPITAL_COMMUNITY): Payer: Self-pay | Admitting: Internal Medicine

## 2020-05-24 ENCOUNTER — Encounter (HOSPITAL_COMMUNITY): Payer: Self-pay

## 2020-05-27 ENCOUNTER — Encounter (HOSPITAL_COMMUNITY): Payer: Self-pay

## 2020-05-28 ENCOUNTER — Other Ambulatory Visit (HOSPITAL_COMMUNITY): Payer: Self-pay | Admitting: Nephrology

## 2020-05-28 DIAGNOSIS — N184 Chronic kidney disease, stage 4 (severe): Secondary | ICD-10-CM

## 2020-05-28 DIAGNOSIS — E1129 Type 2 diabetes mellitus with other diabetic kidney complication: Secondary | ICD-10-CM

## 2020-06-02 ENCOUNTER — Ambulatory Visit (RURAL_HEALTH_CENTER): Payer: Self-pay | Admitting: Gerontology

## 2020-06-03 ENCOUNTER — Encounter (RURAL_HEALTH_CENTER): Payer: Self-pay | Admitting: Gerontology

## 2020-06-03 ENCOUNTER — Ambulatory Visit: Payer: 59 | Attending: Gerontology | Admitting: Gerontology

## 2020-06-03 VITALS — BP 152/100 | HR 78 | Temp 97.5°F | Resp 18 | Ht 67.0 in | Wt 285.6 lb

## 2020-06-03 DIAGNOSIS — I10 Essential (primary) hypertension: Secondary | ICD-10-CM

## 2020-06-03 DIAGNOSIS — F419 Anxiety disorder, unspecified: Secondary | ICD-10-CM

## 2020-06-03 DIAGNOSIS — Z905 Acquired absence of kidney: Secondary | ICD-10-CM

## 2020-06-03 DIAGNOSIS — E1121 Type 2 diabetes mellitus with diabetic nephropathy: Secondary | ICD-10-CM

## 2020-06-03 DIAGNOSIS — N184 Chronic kidney disease, stage 4 (severe): Secondary | ICD-10-CM

## 2020-06-03 LAB — POCT HEMOGLOBIN A1C: POCT Hgb A1C: 8.1 % — AB (ref 3.9–5.9)

## 2020-06-03 LAB — POCT GLUCOSE: Whole Blood Glucose POCT: 147 mg/dL — AB (ref 70–100)

## 2020-06-03 MED ORDER — ALPRAZOLAM 0.25 MG PO TABS
0.2500 mg | ORAL_TABLET | Freq: Two times a day (BID) | ORAL | 0 refills | Status: DC | PRN
Start: 2020-06-03 — End: 2020-09-28

## 2020-06-03 NOTE — Progress Notes (Signed)
Subjective:     Patient ID: Kelli Carlson is a 54 y.o. female.  Chief Complaint   Patient presents with    Hypertension     three month follow up    Diabetes       HPI: S/P nephrectomy left, she is a type 2 diabetic, currently managed with Januvia. She is followed closely by Dr Judeth Horn, nephrology and dr Kerin Perna, urology as well as Dr Oletta Darter, hematology all in [place], [place]    The following portions of the patient's history were reviewed and updated as appropriate: allergies, current medications, past family history, past medical history, past social history, past surgical history and problem list.    Social History     Socioeconomic History    Marital status: Married     Spouse name: Not on file    Number of children: Not on file    Years of education: Not on file    Highest education level: Not on file   Occupational History    Not on file   Tobacco Use    Smoking status: Never Smoker    Smokeless tobacco: Never Used   Vaping Use    Vaping Use: Never used   Substance and Sexual Activity    Alcohol use: No     Alcohol/week: 0.0 standard drinks    Drug use: No    Sexual activity: Yes   Other Topics Concern    Not on file   Social History Narrative    Not on file     Social Determinants of Health     Financial Resource Strain:     Difficulty of Paying Living Expenses:    Food Insecurity:     Worried About Charity fundraiser in the Last Year:     Arboriculturist in the Last Year:    Transportation Needs:     Film/video editor (Medical):     Lack of Transportation (Non-Medical):    Physical Activity:     Days of Exercise per Week:     Minutes of Exercise per Session:    Stress:     Feeling of Stress :    Social Connections:     Frequency of Communication with Friends and Family:     Frequency of Social Gatherings with Friends and Family:     Attends Religious Services:     Active Member of Clubs or Organizations:     Attends Music therapist:     Marital Status:     Intimate Partner Violence:     Fear of Current or Ex-Partner:     Emotionally Abused:     Physically Abused:     Sexually Abused:      Review of Systems   Constitutional: Negative for fatigue and fever.   HENT: Negative.    Respiratory: Negative.    Musculoskeletal: Negative.    Psychiatric/Behavioral: The patient is nervous/anxious.                        Objective:    Physical Exam  Vitals reviewed.   Constitutional:       Appearance: She is obese.   Cardiovascular:      Rate and Rhythm: Normal rate.   Neurological:      Mental Status: She is alert. Mental status is at baseline.   Psychiatric:         Mood and Affect: Mood normal.  Blood pressure (!) 152/100, pulse 78, temperature 97.5 F (36.4 C), temperature source Temporal, resp. rate 18, height 1.702 m (5\' 7" ), weight 129.5 kg (285 lb 9.6 oz).        Medical Decision Making:   1. Type 2 diabetes mellitus with diabetic nephropathy, without long-term current use of insulin  - Januvia 50 MG tablet  - POCT Glucose, fingerstick 147  - POCT Hemoglobin A1C  8.1, previously at 8.6, will need to be monitored closely, as treatment is limited related to CKD    2. S/p nephrectomy  Doing well, no new concerns  3. Stage 4 chronic kidney disease  Followed by nephrology, main be going on dialysis  4. Essential hypertension  Not well controlled, although she has been more anxious of late, nephrology started her on Lasix  5. Anxiety  - ALPRAZolam (Xanax) 0.25 MG tablet; Take 1 tablet (0.25 mg total) by mouth 2 (two) times daily as needed for Anxiety  Dispense: 60 tablet; Refill: 0      Return in about 4 weeks (around 07/01/2020), or if symptoms worsen or fail to improve.      Follow up sooner if no improvement or if symptoms worsen or persist.   Medication dosing instructions, drug-drug interactions and potential side effects discussed in detail.   Failure to comply with plan of care discussed with patient. Importance of adhering to follow up office visits discussed  in detail.   Potential risks of noncompliance discussed with patient.    Ocie Doyne, NP

## 2020-06-06 MED ORDER — FUROSEMIDE 40 MG PO TABS
40.0000 mg | ORAL_TABLET | Freq: Every day | ORAL | 2 refills | Status: DC
Start: 2020-06-06 — End: 2020-11-01

## 2020-06-15 ENCOUNTER — Ambulatory Visit (INDEPENDENT_AMBULATORY_CARE_PROVIDER_SITE_OTHER): Payer: 59 | Admitting: VASCULAR SURGERY

## 2020-06-15 ENCOUNTER — Other Ambulatory Visit: Payer: Self-pay

## 2020-06-15 ENCOUNTER — Encounter (INDEPENDENT_AMBULATORY_CARE_PROVIDER_SITE_OTHER): Payer: Self-pay | Admitting: VASCULAR SURGERY

## 2020-06-15 VITALS — BP 190/115 | HR 88 | Wt 287.0 lb

## 2020-06-15 DIAGNOSIS — E1129 Type 2 diabetes mellitus with other diabetic kidney complication: Secondary | ICD-10-CM

## 2020-06-15 DIAGNOSIS — N184 Chronic kidney disease, stage 4 (severe): Secondary | ICD-10-CM

## 2020-06-15 NOTE — H&P (Signed)
Department of Vascular Surgery  [facility] History and Physical      Date: 06/15/2020  Patient: [name]  DOB: 05-25-1966  PCP: Ocie Doyne, NP    Chief Complaint:   Chief Complaint   Patient presents with   . Establish Care       Subjective:     HPI: [name] is a 54 y.o. White female who presents for evaluation of dialysis access creation.  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  Patient still produces urine and does not require dialysis.  She was referred to vascular surgery clinic for evaluation of access creation.    Patient is right handed and states that she has very difficult time locating veins for peripheral IVs.  She denies chest pain, shortness of breath, abdominal pain, nausea, vomiting, amaurosis fugax, slurred speech, weakness in 1 arm or 1 leg that comes and goes, fevers, chills, intermittent claudication.    PMH:   Past Medical History:   Diagnosis Date   . Anxiety    . Arthritis    . Cancer (CMS [place]) 2019, 2021    kidney   . Cellulitis 10/24/14    left foot   . 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 Depression    . Diabetes mellitus (CMS [place])    . Diabetes mellitus, type 2 (CMS HCC)    . Essential hypertension    . Fatty liver    . Headache(784.0)    . History of nephrectomy 03/24/2020    L nephrectomy   . Hyperlipidemia    . Irritable bowel syndrome    . Macular edema    . Morbid obesity (CMS [place])    . Peripheral neuropathy     bilateral feet   . Type 2 diabetes mellitus (CMS HCC)    . White coat hypertension            Family Hx:  Family Medical History:     Problem Relation (Age of Onset)    Breast Cancer Paternal Aunt    Cancer Maternal Grandmother    Diabetes Father, Paternal Aunt    High Cholesterol Maternal Uncle, Mother    Hypertension (High Blood Pressure) Maternal Uncle, Father              Medications:  Current Outpatient Medications   Medication Sig  Dispense Refill   . albuterol sulfate (PROVENTIL OR VENTOLIN OR PROAIR) 90 mcg/actuation Inhalation HFA Aerosol Inhaler Take 2 Puffs by inhalation Every 4 hours as needed 2 Inhaler 0   . ALPRAZolam (XANAX) 0.25 mg Oral Tablet Take 0.25 mg by mouth     . amLODIPine (NORVASC) 2.5 mg Oral Tablet Take 2.5 mg by mouth     . ascorbic acid, vitamin C, (VITAMIN C) 500 mg Oral Tablet Take 500 mg by mouth Once a day      . atorvastatin (LIPITOR) 20 mg Oral Tablet Take 20 mg by mouth Every evening     . cholecalciferol, Vitamin D3, (VI-D-SOL) 10 mcg/mL (400 unit/mL) Oral Drops Take 2,000 Units by mouth Once a day      . escitalopram oxalate (LEXAPRO) 10 mg Oral Tablet Take 10 mg by mouth Every night     . Fish Oil-Omega-3 Fatty Acids 360-1,200 mg Oral Capsule Take 2 Caps by mouth Twice daily     . fluticasone (FLONASE) 50 mcg/actuation Nasal  Spray, Suspension 1 Spray by Each Nostril route Once a day     . furosemide (LASIX) 40 mg Oral Tablet      . JANUVIA 50 mg Oral Tablet      . lactobacillus (FLORANEX) 1 million cell Oral Tablet Take 1 Tablet by mouth Twice daily with food for 90 days 60 Tablet 2   . loperamide (IMODIUM) 2 mg Oral Capsule Take 2 mg by mouth Every 4 hours as needed     . loratadine (CLARITIN) 10 mg Oral Tablet Take 10 mg by mouth Once per day as needed      . losartan (COZAAR) 25 mg Oral Tablet Take 1 Tablet (25 mg total) by mouth Once a day for 30 days 30 Tablet 0   . magnesium oxide (MAG-OX) 400 mg (241.3 mg magnesium) Oral Tablet Take 250 mg by mouth Once a day      . tiotropium bromide (SPIRIVA HANDIHALER) 18 mcg Inhalation Capsule, w/Inhalation Device Take 1 Cap (18 mcg total) by inhalation Once a day for 14 days (Patient taking differently: Take 18 mcg by inhalation Once per day as needed ) 14 Cap 0     No current facility-administered medications for this visit.       Allergies:  Allergies   Allergen Reactions   . Vicodin [Hydrocodone-Acetaminophen] Itching       Past Surgical Hx:  Past Surgical  History:   Procedure Laterality Date   . ECTOPIC PREGNANCY SURGERY  05/24/1998   . HX CATARACT REMOVAL Bilateral 2016   . HX CHOLECYSTECTOMY  09/10/96   . HX PELVIC LAPAROSCOPY  05/24/98    Ectopic pregnancy   . HX TUBAL LIGATION  09/10/2012    Filshie Clips   . HYMENECTOMY  1996   . KIDNEY SURGERY  2019, 2021   . LAPAROSCOPIC PARTIAL NEPHRECTOMY Left 10/24/2017    hand-assisted   . RENAL BIOPSY, PERCUTANEOUS  03/08/2020    clear cell renal cell carcinoma           Social Hx:  Social History     Socioeconomic History   . Marital status: Married     Spouse name: Not on file   . Number of children: Not on file   . Years of education: Not on file   . Highest education level: Not on file   Occupational History   . Not on file   Tobacco Use   . Smoking status: Never Smoker   . Smokeless tobacco: Never Used   Vaping Use   . Vaping Use: Never used   Substance and Sexual Activity   . Alcohol use: No   . Drug use: No   . Sexual activity: Yes     Partners: Male     Birth control/protection: Surgical   Other Topics Concern   . Ability to Walk 1 Flight of Steps without SOB/CP Not Asked   . Routine Exercise Not Asked   . Ability to Walk 2 Flight of Steps without SOB/CP Yes   . Unable to Ambulate Not Asked   . Total Care Not Asked   . Ability To Do Own ADL's Yes   . Uses Walker Not Asked   . Other Activity Level Not Asked   . Uses Cane Not Asked   Social History Narrative   . Not on file     Social Determinants of Health     Financial Resource Strain:    . Difficulty of Paying Living Expenses:  Food Insecurity:    . Worried About Charity fundraiser in the Last Year:    . Arboriculturist in the Last Year:    Transportation Needs:    . Film/video editor (Medical):    Marland Kitchen Lack of Transportation (Non-Medical):    Physical Activity:    . Days of Exercise per Week:    . Minutes of Exercise per Session:    Stress:    . Feeling of Stress :    Intimate Partner Violence:    . Fear of Current or Ex-Partner:    . Emotionally Abused:     Marland Kitchen Physically Abused:    . Sexually Abused:        Review Of Systems:  As per HPI, all other systems reviewed and are negative    Objective:  Physical Exam:     Constitutional:   BP (!) 190/115   Pulse 88   Wt 130 kg (287 lb)   LMP 10/24/2014 (Approximate)   SpO2 99%   BMI 45.91 kg/m       Appears in good health and no distress  Eyes:   Conjunctiva clear, eyelids normal.   Pupils equal, round, and reactive to light and accomodation.   ENT:   Ears and nose have normal overall appearance with no lesions or masses.    Lips, teeth and gums have normal overall appearance  Neck:   Supple, symmetrical, trachea midline, no visible masses, no crepitus  Thyroid without visible enlargement, without palpable masses and non-tender  Respiratory:   Non-labored respirations, symmetrical chest expansion, no involvement of accessory muscles  Palpation of chest with symmetrical tactile fremitus, no tenderness and no crepitus   Cardiovascular:    Right radial artery: 2+ (normal), Left radial artery: 2+ (normal)  Right femoral artery: 2+ (normal), Left femoral artery: 2+ (normal)  Right dorsalis pedis artery:  2+ (normal), Left dorsalis pedis artery:  2+ (normal)   Right posterior tibeal artery:  2+ (normal), Left posterior tibial artery: 2+ (normal)   No extremity edema or varicosities  Abdomen:   Soft, non-tender, non-distended, no palpable masses  No palpable enlargement of liver or spleen  Neurologic:   Cranial nerves II - XII grossly intact  Strength 5/5 in all extremities   Sensation to light touch is intact and symmetrical to face, upper and lower extremities  Psychiatric:   Patient is oriented to time, place and person   Mood: Stable mood and affect       IMAGING/LAB RESULTS:   N/A    Assessment/Plan:  Chronic kidney disease, stage IV - patient in need for dialysis access, she has non visible veins on her bilateral upper extremities.  She had good bilateral radial pulses.  I will refer the patient to vascular ultrasound  to vein map her bilateral upper extremities and will I will see her in 1 month to discuss the results and to plan the procedure.  I briefly discussed with the patient the algorithm of creation of dialysis access including starting with autogenous access, followed by synthetic graft and followed by catheters.    Patient was given the opportunity to ask questions and those questions were answered to their satisfaction. Instructed to call with any further questions or concerns.       Kaylyn Lim, MD

## 2020-06-16 ENCOUNTER — Emergency Department: Payer: Worker's Comp, Other unspecified

## 2020-06-16 ENCOUNTER — Emergency Department
Admission: EM | Admit: 2020-06-16 | Discharge: 2020-06-16 | Disposition: A | Discharge status: Home / Self Care | Payer: Worker's Comp, Other unspecified | Attending: Emergency Medicine | Admitting: Emergency Medicine

## 2020-06-16 ENCOUNTER — Other Ambulatory Visit (INDEPENDENT_AMBULATORY_CARE_PROVIDER_SITE_OTHER): Payer: Self-pay | Admitting: VASCULAR SURGERY

## 2020-06-16 DIAGNOSIS — N185 Chronic kidney disease, stage 5: Secondary | ICD-10-CM

## 2020-06-16 DIAGNOSIS — S60941A Unspecified superficial injury of left index finger, initial encounter: Secondary | ICD-10-CM | POA: Insufficient documentation

## 2020-06-16 DIAGNOSIS — S60022A Contusion of left index finger without damage to nail, initial encounter: Secondary | ICD-10-CM

## 2020-06-16 DIAGNOSIS — W010XXA Fall on same level from slipping, tripping and stumbling without subsequent striking against object, initial encounter: Secondary | ICD-10-CM | POA: Insufficient documentation

## 2020-06-16 NOTE — ED Triage Notes (Signed)
Smashed her left index finger with pans at work.  Has pain and swelling to the finger.

## 2020-06-16 NOTE — ED Provider Notes (Signed)
Nursing (triage) note reviewed for the following pertinent information:  Smashed left index finger      History     Chief Complaint   Patient presents with    Finger Injury     54 year old female with a history of anxiety, arthritis, cellulitis, depression, DM, fatty liver, IBS, left kidney cancer, obesity, and whitecoat hypertension presents with complaint of the left second finger pain.  She reports she was at work just prior to arrival when a pan fell and landed on her hand.  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  She states pain is 7/10.  Is worse with pressure.  Better at rest.  No obvious deformity or laceration.  Patient is right-handed.        Past Medical History:   Diagnosis Date    Anxiety     Arthritis     Cellulitis     left foot    Depression     Diabetes mellitus     Fatty liver     Irritable bowel syndrome     Malignant neoplasm     Left Kidney CA    Obesity     White coat hypertension      Past Surgical History:   Procedure Laterality Date    ABDOMINAL SURGERY      Partial removal of the left kidney    CATARACT EXTRACTION  2016    CHOLECYSTECTOMY      ECTOPIC PREGNANCY SURGERY  05/25/1998    EYE SURGERY      GALLBLADDER SURGERY  09/11/1996    KIDNEY SURGERY  10/24/2017    kidney cancer    NEPHRECTOMY Left 03/24/2020    Complete    PELVIC LAPAROSCOPY      TRIGGER FINGER RELEASE  03/31/2016    TUBAL LIGATION      TUBAL LIGATION  09/10/2012     Family History   Problem Relation Age of Onset    Hyperlipidemia Mother     Hypertension Father     Diabetes Father     Breast cancer Maternal Grandmother     Alzheimer's disease Paternal Grandmother     Leukemia Paternal Grandfather      Social  Social History     Tobacco Use    Smoking status: Never Smoker    Smokeless tobacco: Never Used   Brewing technologist Use: Never used   Substance Use Topics    Alcohol use: No     Alcohol/week: 0.0 standard drinks    Drug use: No     Allergies   Allergen Reactions     Hydrocodone-Acetaminophen Itching and Other (See Comments)     Home Medications     Med List Status: Complete Set By: Mikle Bosworth, RN at 06/16/2020 11:34 AM                ALPRAZolam (Xanax) 0.25 MG tablet     Take 1 tablet (0.25 mg total) by mouth 2 (two) times daily as needed for Anxiety     amLODIPine (NORVASC) 2.5 MG tablet     Take 1 tablet by mouth daily     Ascorbic Acid (vitamin C, acerola,) 500 MG tablet     Take 500 mg by mouth daily     atorvastatin (LIPITOR) 20 MG tablet          Cholecalciferol (VITAMIN D) 400 units/mL liquid (NICU)     Take 2,000 Units by mouth daily  escitalopram (LEXAPRO) 10 MG tablet     Take 1 tablet (10 mg total) by mouth daily     furosemide (LASIX) 40 MG tablet     Take 1 tablet (40 mg total) by mouth daily     Januvia 50 MG tablet          loratadine (CLARITIN) 10 MG tablet     Take 10 mg by mouth daily.     losartan (COZAAR) 25 MG tablet          magnesium oxide (MAG-OX) 400 MG tablet     Take 400 mg by mouth daily.     Multiple Vitamin (MULTI-VITAMIN DAILY PO)     Take 1 tablet by mouth daily     Omega-3 Fatty Acids (fish oil) 1000 MG Cap capsule     Take 1 capsule by mouth 2 (two) times daily     ondansetron (ZOFRAN-ODT) 4 MG disintegrating tablet                                                                          Review of Systems   Constitutional: Negative for chills, diaphoresis and fever.   HENT: Negative for rhinorrhea and sore throat.    Eyes: Negative for redness.   Respiratory: Negative for cough, shortness of breath and wheezing.    Cardiovascular: Negative for chest pain, palpitations and leg swelling.   Gastrointestinal: Negative for abdominal pain, constipation, diarrhea, nausea and vomiting.   Genitourinary: Negative for difficulty urinating, dysuria, frequency and urgency.   Musculoskeletal: Positive for arthralgias. Negative for joint swelling.   Skin: Negative for rash.   Neurological: Negative for weakness.   Psychiatric/Behavioral: Negative  for confusion and hallucinations.     Physical Exam    BP: (!) 198/118, Heart Rate: 96, Temp: 98.7 F (37.1 C), Resp Rate: 20, SpO2: 98 %, Weight: 132 kg    Physical Exam  Constitutional:       Appearance: Normal appearance.   HENT:      Head: Normocephalic and atraumatic.   Eyes:      Extraocular Movements: Extraocular movements intact.      Pupils: Pupils are equal, round, and reactive to light.   Musculoskeletal:         General: Swelling and tenderness present. No deformity.      Cervical back: Normal range of motion.      Comments: Left first finger with ecchymosis on the proximal phalange.   Skin:     Findings: Bruising present.   Neurological:      Mental Status: She is alert.   Psychiatric:         Mood and Affect: Mood normal.         Behavior: Behavior normal.       MDM and ED Course     ED Medication Orders (From admission, onward)    None         MDM  Number of Diagnoses or Management Options  Diagnosis management comments: 54 year old female with tender left second finger.  X-rays negative for fracture.  Recommend rest, ice, Tylenol/Motrin.  Worker's Comp. filled out.  Recommend buddy tape as needed.  Recommend close follow-up PCP, discussed limitations of x-rays, return precautions  given.  Patient states she understands agrees with plan.       Amount and/or Complexity of Data Reviewed  Tests in the radiology section of CPT: reviewed      ED Course as of Jun 16 1220   Wed Jun 16, 2020   1218 IMPRESSION:   Normal left second finger x-rays.   XR Finger(s) Left Minimum 2 View [PB]      ED Course User Index  [PB] Lariyah Shetterly, Verdie Shire, MD     Procedures    Clinical Impression & Disposition     Clinical Impression  Final diagnoses:   Contusion of left index finger without damage to nail, initial encounter      ED Disposition     ED Disposition Condition Date/Time Comment    Discharge  Wed Jun 16, 2020 12:20 PM Sigurd Sos discharge to home/self care.    Condition at disposition: Stable            New Prescriptions    No medications on file                 Celedonio Miyamoto, MD  06/16/20 1222

## 2020-06-18 NOTE — Addendum Note (Signed)
Addended byKaylyn Lim on: 06/18/2020 10:23 AM     Modules accepted: Orders

## 2020-06-18 NOTE — Addendum Note (Signed)
Addended byKaylyn Lim on: 06/18/2020 01:44 PM     Modules accepted: Orders

## 2020-06-22 ENCOUNTER — Other Ambulatory Visit (INDEPENDENT_AMBULATORY_CARE_PROVIDER_SITE_OTHER): Payer: Self-pay | Admitting: VASCULAR SURGERY

## 2020-06-25 ENCOUNTER — Other Ambulatory Visit (RURAL_HEALTH_CENTER): Payer: Self-pay | Admitting: Gerontology

## 2020-06-25 MED ORDER — ESCITALOPRAM OXALATE 10 MG PO TABS
10.0000 mg | ORAL_TABLET | Freq: Every day | ORAL | 2 refills | Status: DC
Start: 2020-06-25 — End: 2021-03-14

## 2020-06-25 NOTE — Telephone Encounter (Signed)
RF request sent to provider for approval.

## 2020-06-25 NOTE — Telephone Encounter (Signed)
Kelli Carlson needs a refill on Lexapro 10mg  one daily 3 month supply     [facility]

## 2020-07-01 ENCOUNTER — Ambulatory Visit (RURAL_HEALTH_CENTER): Payer: Self-pay | Admitting: Gerontology

## 2020-07-08 ENCOUNTER — Encounter (INDEPENDENT_AMBULATORY_CARE_PROVIDER_SITE_OTHER): Payer: Self-pay | Admitting: VASCULAR SURGERY

## 2020-07-08 ENCOUNTER — Inpatient Hospital Stay (INDEPENDENT_AMBULATORY_CARE_PROVIDER_SITE_OTHER)
Admission: RE | Admit: 2020-07-08 | Discharge: 2020-07-08 | Disposition: A | Discharge status: Home / Self Care | Payer: 59 | Source: Ambulatory Visit

## 2020-07-08 ENCOUNTER — Other Ambulatory Visit: Payer: Self-pay

## 2020-07-08 DIAGNOSIS — N184 Chronic kidney disease, stage 4 (severe): Secondary | ICD-10-CM

## 2020-07-13 ENCOUNTER — Encounter (INDEPENDENT_AMBULATORY_CARE_PROVIDER_SITE_OTHER): Payer: Self-pay | Admitting: VASCULAR SURGERY

## 2020-07-23 ENCOUNTER — Telehealth (RURAL_HEALTH_CENTER): Payer: Self-pay

## 2020-07-23 ENCOUNTER — Other Ambulatory Visit (RURAL_HEALTH_CENTER): Payer: Self-pay | Admitting: Gerontology

## 2020-07-23 MED ORDER — AMOXICILLIN 250 MG PO CAPS
250.0000 mg | ORAL_CAPSULE | Freq: Three times a day (TID) | ORAL | 0 refills | Status: AC
Start: 2020-07-23 — End: 2020-07-30

## 2020-07-23 NOTE — Telephone Encounter (Signed)
Amoxil sent to [facility].    Patient is aware.    jlm/ma

## 2020-07-23 NOTE — Telephone Encounter (Signed)
Sinus infection. Sinus and Ear pressure.    [facility]

## 2020-07-31 ENCOUNTER — Ambulatory Visit
Admission: RE | Admit: 2020-07-31 | Discharge: 2020-07-31 | Disposition: A | Discharge status: Home / Self Care | Payer: 59 | Source: Ambulatory Visit | Attending: Nephrology | Admitting: Nephrology

## 2020-07-31 DIAGNOSIS — D631 Anemia in chronic kidney disease: Secondary | ICD-10-CM | POA: Insufficient documentation

## 2020-07-31 DIAGNOSIS — N184 Chronic kidney disease, stage 4 (severe): Secondary | ICD-10-CM | POA: Insufficient documentation

## 2020-07-31 DIAGNOSIS — N189 Chronic kidney disease, unspecified: Secondary | ICD-10-CM | POA: Insufficient documentation

## 2020-07-31 LAB — CBC AND DIFFERENTIAL
Basophils %: 0.5 % (ref 0.0–3.0)
Basophils Absolute: 0 10*3/uL (ref 0.0–0.3)
Eosinophils %: 1.1 % (ref 0.0–7.0)
Eosinophils Absolute: 0.1 10*3/uL (ref 0.0–0.8)
Hematocrit: 33.6 % — ABNORMAL LOW (ref 36.0–48.0)
Hemoglobin: 11.1 gm/dL — ABNORMAL LOW (ref 12.0–16.0)
Lymphocytes Absolute: 1.6 10*3/uL (ref 0.6–5.1)
Lymphocytes: 23.5 % (ref 15.0–46.0)
MCH: 29 pg (ref 28–35)
MCHC: 33 gm/dL (ref 31–36)
MCV: 88 fL (ref 80–100)
MPV: 7.9 fL (ref 6.0–10.0)
Monocytes Absolute: 0.4 10*3/uL (ref 0.1–1.7)
Monocytes: 6.1 % (ref 3.0–15.0)
Neutrophils %: 68.8 % (ref 42.0–78.0)
Neutrophils Absolute: 4.6 10*3/uL (ref 1.7–8.6)
PLT CT: 198 10*3/uL (ref 130–440)
RBC: 3.8 10*6/uL (ref 3.80–5.00)
RDW: 14.2 % (ref 10.5–14.5)
WBC: 6.7 10*3/uL (ref 4.0–11.0)

## 2020-07-31 LAB — RENAL FUNCTION PANEL
Albumin: 2.8 gm/dL — ABNORMAL LOW (ref 3.5–5.0)
Anion Gap: 16.3 mMol/L (ref 7.0–18.0)
BUN / Creatinine Ratio: 9.9 Ratio — ABNORMAL LOW (ref 10.0–30.0)
BUN: 47 mg/dL — ABNORMAL HIGH (ref 7–22)
CO2: 22.3 mMol/L (ref 20.0–30.0)
Calcium: 9.2 mg/dL (ref 8.5–10.5)
Chloride: 108 mMol/L (ref 98–110)
Creatinine: 4.77 mg/dL — ABNORMAL HIGH (ref 0.60–1.20)
EGFR: 10 mL/min/{1.73_m2} — ABNORMAL LOW (ref 60–150)
Glucose: 158 mg/dL — ABNORMAL HIGH (ref 71–99)
Osmolality Calculated: 299 mOsm/kg (ref 275–300)
Phosphorus: 5.4 mg/dL — ABNORMAL HIGH (ref 2.3–4.7)
Potassium: 4.6 mMol/L (ref 3.5–5.3)
Sodium: 142 mMol/L (ref 136–147)

## 2020-07-31 LAB — PTH, INTACT: PTH Intact: 640.6 pg/mL — ABNORMAL HIGH (ref 20.0–83.0)

## 2020-08-01 LAB — VITAMIN D,25 OH,TOTAL: Vitamin D 25-Hydroxy: 19.5 ng/mL — ABNORMAL LOW (ref 30.0–80.0)

## 2020-08-03 ENCOUNTER — Other Ambulatory Visit (INDEPENDENT_AMBULATORY_CARE_PROVIDER_SITE_OTHER): Payer: Self-pay | Admitting: VASCULAR SURGERY

## 2020-08-03 DIAGNOSIS — N185 Chronic kidney disease, stage 5: Secondary | ICD-10-CM

## 2020-08-09 ENCOUNTER — Other Ambulatory Visit: Payer: Self-pay

## 2020-08-09 ENCOUNTER — Encounter (HOSPITAL_BASED_OUTPATIENT_CLINIC_OR_DEPARTMENT_OTHER): Payer: Self-pay

## 2020-08-09 ENCOUNTER — Ambulatory Visit
Admission: RE | Admit: 2020-08-09 | Discharge: 2020-08-09 | Disposition: A | Discharge status: Home / Self Care | Payer: 59 | Source: Ambulatory Visit | Attending: VASCULAR SURGERY | Admitting: VASCULAR SURGERY

## 2020-08-09 ENCOUNTER — Other Ambulatory Visit (HOSPITAL_BASED_OUTPATIENT_CLINIC_OR_DEPARTMENT_OTHER): Payer: Self-pay

## 2020-08-09 DIAGNOSIS — Z01818 Encounter for other preprocedural examination: Secondary | ICD-10-CM

## 2020-08-09 DIAGNOSIS — N184 Chronic kidney disease, stage 4 (severe): Secondary | ICD-10-CM

## 2020-08-09 HISTORY — DX: Thyrotoxicosis, unspecified without thyrotoxic crisis or storm: E05.90

## 2020-08-09 NOTE — Nurses Notes (Signed)
PST Patient is asymptomatic at this time for any of the eight COVID-19 symptoms. Patient has been instructed to quarantine for seven days if possible, but must perform strict isolation for at least 48 hours prior to procedure. Patient will take temperature 2 times a day, if they have a thermometer, and bring the log with them day of surgery.

## 2020-08-09 NOTE — Discharge Instructions (Signed)
Nothing to eat or drink after midnight the day of surgery except a sip of water with claratin Xanax as needed

## 2020-08-10 ENCOUNTER — Ambulatory Visit: Payer: 59 | Attending: Pain Medicine

## 2020-08-10 DIAGNOSIS — Z01818 Encounter for other preprocedural examination: Secondary | ICD-10-CM

## 2020-08-10 DIAGNOSIS — N184 Chronic kidney disease, stage 4 (severe): Secondary | ICD-10-CM | POA: Insufficient documentation

## 2020-08-10 LAB — TYPE AND SCREEN
ABO/RH(D): O POS
ANTIBODY SCREEN: NEGATIVE

## 2020-08-10 LAB — BASIC METABOLIC PANEL
ANION GAP: 11 mmol/L (ref 4–13)
BUN/CREA RATIO: 10 (ref 6–22)
BUN: 48 mg/dL — ABNORMAL HIGH (ref 8–25)
CALCIUM: 8.4 mg/dL — ABNORMAL LOW (ref 8.5–10.0)
CHLORIDE: 106 mmol/L (ref 96–111)
CO2 TOTAL: 21 mmol/L — ABNORMAL LOW (ref 22–30)
CREATININE: 5.01 mg/dL — ABNORMAL HIGH (ref 0.60–1.05)
ESTIMATED GFR: 9 mL/min/BSA — ABNORMAL LOW (ref >=60)
GLUCOSE: 241 mg/dL — ABNORMAL HIGH (ref 65–125)
POTASSIUM: 5.2 mmol/L — ABNORMAL HIGH (ref 3.5–5.1)
SODIUM: 138 mmol/L (ref 136–145)

## 2020-08-10 LAB — CBC W/NO DIFF
HCT: 32.3 % — ABNORMAL LOW (ref 34.8–46.0)
HGB: 10.4 g/dL — ABNORMAL LOW (ref 11.5–16.0)
MCH: 29.3 pg (ref 26.0–32.0)
MCHC: 32.2 g/dL (ref 31.0–35.5)
MCV: 91 fL (ref 78.0–100.0)
MPV: 10.9 fL (ref 8.7–12.5)
PLATELETS: 236 10*3/uL (ref 150–400)
RBC: 3.55 10*6/uL — ABNORMAL LOW (ref 3.85–5.22)
RDW-CV: 14.3 % (ref 11.5–15.5)
WBC: 6.1 10*3/uL (ref 3.7–11.0)

## 2020-08-10 MED ORDER — CEFAZOLIN 2 GRAM/50 ML IN DEXTROSE (ISO-OSMOTIC) INTRAVENOUS PIGGYBACK
2.0000 g | INJECTION | Freq: Once | INTRAVENOUS | Status: DC
Start: 2020-08-11 — End: 2020-08-11
  Filled 2020-08-10: qty 50

## 2020-08-11 ENCOUNTER — Encounter (HOSPITAL_COMMUNITY): Payer: Self-pay | Admitting: VASCULAR SURGERY

## 2020-08-11 ENCOUNTER — Inpatient Hospital Stay
Admission: RE | Admit: 2020-08-11 | Discharge: 2020-08-11 | Disposition: A | Discharge status: Home / Self Care | Payer: 59 | Source: Ambulatory Visit | Attending: VASCULAR SURGERY | Admitting: VASCULAR SURGERY

## 2020-08-11 ENCOUNTER — Encounter (HOSPITAL_COMMUNITY)
Admission: RE | Disposition: A | Discharge status: Home / Self Care | Payer: Self-pay | Source: Ambulatory Visit | Attending: VASCULAR SURGERY

## 2020-08-11 ENCOUNTER — Ambulatory Visit (HOSPITAL_COMMUNITY): Payer: 59 | Admitting: Pain Medicine

## 2020-08-11 ENCOUNTER — Other Ambulatory Visit: Payer: Self-pay

## 2020-08-11 ENCOUNTER — Ambulatory Visit (HOSPITAL_COMMUNITY): Payer: 59 | Admitting: Certified Registered"

## 2020-08-11 DIAGNOSIS — Z905 Acquired absence of kidney: Secondary | ICD-10-CM | POA: Insufficient documentation

## 2020-08-11 DIAGNOSIS — N185 Chronic kidney disease, stage 5: Secondary | ICD-10-CM | POA: Insufficient documentation

## 2020-08-11 DIAGNOSIS — I120 Hypertensive chronic kidney disease with stage 5 chronic kidney disease or end stage renal disease: Secondary | ICD-10-CM | POA: Insufficient documentation

## 2020-08-11 DIAGNOSIS — Z85528 Personal history of other malignant neoplasm of kidney: Secondary | ICD-10-CM | POA: Insufficient documentation

## 2020-08-11 DIAGNOSIS — E1122 Type 2 diabetes mellitus with diabetic chronic kidney disease: Secondary | ICD-10-CM | POA: Insufficient documentation

## 2020-08-11 HISTORY — PX: AV FISTULA PLACEMENT: SHX1204

## 2020-08-11 LAB — POC FINGERSTICK GLUCOSE - BMC/JMC (RESULTS): GLUCOSE, POC: 179 mg/dL — ABNORMAL HIGH (ref 60–100)

## 2020-08-11 LAB — POTASSIUM: POTASSIUM: 4.8 mmol/L (ref 3.5–5.1)

## 2020-08-11 SURGERY — CREATION FISTULA ARTERIO VENOUS
Anesthesia: General | Laterality: Left | Wound class: Clean Wound: Uninfected operative wounds in which no inflammation occurred

## 2020-08-11 MED ORDER — PROPOFOL 10 MG/ML IV BOLUS
INJECTION | Freq: Once | INTRAVENOUS | Status: DC | PRN
Start: 2020-08-11 — End: 2020-08-11
  Administered 2020-08-11: 150 mg via INTRAVENOUS

## 2020-08-11 MED ORDER — ONDANSETRON HCL (PF) 4 MG/2 ML INJECTION SOLUTION
Freq: Once | INTRAMUSCULAR | Status: DC | PRN
Start: 2020-08-11 — End: 2020-08-11
  Administered 2020-08-11: 4 mg via INTRAVENOUS

## 2020-08-11 MED ORDER — HEPARIN (PORCINE) 1,000 UNIT/ML INJECTION - EAST
Freq: Once | INTRAMUSCULAR | Status: DC | PRN
Start: 2020-08-11 — End: 2020-08-11
  Administered 2020-08-11: 2000 [IU] via INTRAVENOUS

## 2020-08-11 MED ORDER — ROCURONIUM 10 MG/ML INTRAVENOUS SOLUTION
Freq: Once | INTRAVENOUS | Status: DC | PRN
Start: 2020-08-11 — End: 2020-08-11
  Administered 2020-08-11: 30 mg via INTRAVENOUS
  Administered 2020-08-11: 10 mg via INTRAVENOUS

## 2020-08-11 MED ORDER — LIDOCAINE HCL 10 MG/ML (1 %) INJECTION SOLUTION
Freq: Once | INTRAMUSCULAR | Status: DC | PRN
Start: 2020-08-11 — End: 2020-08-11
  Administered 2020-08-11: 5 mL

## 2020-08-11 MED ORDER — OXYCODONE-ACETAMINOPHEN 5 MG-325 MG TABLET
1.0000 | ORAL_TABLET | Freq: Once | ORAL | Status: DC | PRN
Start: 2020-08-11 — End: 2020-08-11

## 2020-08-11 MED ORDER — ACETAMINOPHEN 1,000 MG/100 ML (10 MG/ML) INTRAVENOUS SOLUTION
1000.0000 mg | Freq: Once | INTRAVENOUS | Status: AC | PRN
Start: 2020-08-11 — End: 2020-08-11
  Administered 2020-08-11: 1000 mg via INTRAVENOUS
  Filled 2020-08-11: qty 100

## 2020-08-11 MED ORDER — HYDROMORPHONE (PF) 0.5 MG/0.5 ML INJECTION SYRINGE
0.5000 mg | INJECTION | INTRAMUSCULAR | Status: DC | PRN
Start: 2020-08-11 — End: 2020-08-11
  Administered 2020-08-11 (×2): 0.5 mg via INTRAVENOUS
  Filled 2020-08-11 (×2): qty 0.5

## 2020-08-11 MED ORDER — LIDOCAINE (PF) 10 MG/ML (1 %) INJECTION SOLUTION
INTRAMUSCULAR | Status: AC
Start: 2020-08-11 — End: 2020-08-11
  Filled 2020-08-11: qty 60

## 2020-08-11 MED ORDER — SUGAMMADEX 100 MG/ML INTRAVENOUS SOLUTION
Freq: Once | INTRAVENOUS | Status: DC | PRN
Start: 2020-08-11 — End: 2020-08-11
  Administered 2020-08-11: 260 mg via INTRAVENOUS

## 2020-08-11 MED ORDER — LACTATED RINGERS INTRAVENOUS SOLUTION
INTRAVENOUS | Status: DC
Start: 2020-08-11 — End: 2020-08-11

## 2020-08-11 MED ORDER — HEPARIN (PORCINE) (PF) 2,000 UNIT/1,000 ML IN 0.9 % SODIUM CHLORIDE IV
INTRAVENOUS | Status: AC
Start: 2020-08-11 — End: 2020-08-11
  Filled 2020-08-11: qty 1000

## 2020-08-11 MED ORDER — MIDAZOLAM 1 MG/ML INJECTION SOLUTION
Freq: Once | INTRAMUSCULAR | Status: DC | PRN
Start: 2020-08-11 — End: 2020-08-11
  Administered 2020-08-11: 2 mg via INTRAVENOUS

## 2020-08-11 MED ORDER — SODIUM CHLORIDE 0.9 % INTRAVENOUS SOLUTION
INTRAVENOUS | Status: DC | PRN
Start: 2020-08-11 — End: 2020-08-11

## 2020-08-11 MED ORDER — CEFAZOLIN 1 GRAM SOLUTION FOR INJECTION
Freq: Once | INTRAMUSCULAR | Status: DC | PRN
Start: 2020-08-11 — End: 2020-08-11
  Administered 2020-08-11: 3000 mg via INTRAVENOUS

## 2020-08-11 MED ORDER — EPHEDRINE SULFATE 50 MG/ML INTRAVENOUS SOLUTION
Freq: Once | INTRAVENOUS | Status: DC | PRN
Start: 2020-08-11 — End: 2020-08-11
  Administered 2020-08-11 (×2): 5 mg via INTRAVENOUS

## 2020-08-11 MED ORDER — HEPARIN (PORCINE) (PF) 2,000 UNIT/1,000 ML IN 0.9 % SODIUM CHLORIDE IV
Freq: Once | INTRAVENOUS | Status: DC | PRN
Start: 2020-08-11 — End: 2020-08-11
  Administered 2020-08-11: 200 mL

## 2020-08-11 MED ORDER — FENTANYL (PF) 50 MCG/ML INJECTION SOLUTION
Freq: Once | INTRAMUSCULAR | Status: DC | PRN
Start: 2020-08-11 — End: 2020-08-11
  Administered 2020-08-11: 100 ug via INTRAVENOUS
  Administered 2020-08-11: 50 ug via INTRAVENOUS

## 2020-08-11 SURGICAL SUPPLY — 24 items
ADH LIQUID LF  WTPRF VIAL PREP NONSTAIN MASTISOL STYRAX GUM MASTIC ALC MTHY SLCYT STRL CLR NHZR 2/3 (SEALANTS) ×1 IMPLANT
ADH LQ LF VIAL AMP PREP MASTI SOL STYRAX GUM MASTIC ALC MTHY (SEALANTS) ×1
APPL 70% ISPRP 2% CHG 26ML 13. 2X13.2IN CHLRPRP PREP DEHP-FR (WOUND CARE/ENTEROSTOMAL SUPPLY) ×1
APPL 70% ISPRP 2% CHG 26ML CHLRPRP HI-LT ORNG PREP STRL LF  DISP CLR (WOUND CARE SUPPLY) ×1 IMPLANT
APPLIER PREM SRGCLP III 9IN 20 CLIP TI SM INTERNAL CLIP DISP (ENDOSCOPIC SUPPLIES) ×1 IMPLANT
APPLIER PREM SRGCLP SUP INTLK 11.5IN 30 ATO CLIP LGT ERG (INSTRUMENTS ENDOMECHANICAL) ×1
APPLIER PREM SRGCLP SUP INTLK 11.5IN 30 ATO CLIP LGT ERG HNDL TAB RATCHET CLSR TI MED INTERNAL CLIP (ENDOSCOPIC SUPPLIES) ×1 IMPLANT
APPLIER SURGICLIP MULTIPLE SM 133650 6EA/BX (INSTRUMENTS ENDOMECHANICAL) ×1
BONE CEM PRS SEAL MED SYSTEM F EM PROX STRL (CANNULA)
CLOSURE SKIN STRIPS 1/4X3IN_R1541 3/PK 50PK/BX (WOUND CARE/ENTEROSTOMAL SUPPLY) ×1
GOWN SURG 2XL L4 REINF HKLP CL SR BRTHBL FILM SLEEVE STRL LF (PROTECTIVE PRODUCTS/GARMENTS) ×4
GOWN SURG 2XL L4 REINF HKLP CLSR BRTHBL FILM SLEEVE STRL LF  DISP BLU SIRUS SMS PE 49IN (PROTECTIVE PRODUCTS/GARMENTS) ×4 IMPLANT
GOWN SURG XL AAMI L3 NONREINFO RCE HKLP CLSR SET IN SLEEVE (PROTECTIVE PRODUCTS/GARMENTS) ×4
GOWN SURG XL L3 NONREINFORCE HKLP CLSR SET IN SLEEVE STRL LF  DISP BLU AURR FBRC 47IN (PROTECTIVE PRODUCTS/GARMENTS) ×4 IMPLANT
GOWN SURG XL L4 REINF HKLP CLS R BRTHBL FILM SLEEVE STRL LF (PROTECTIVE PRODUCTS/GARMENTS)
GOWN SURG XL L4 REINF HKLP CLSR BRTHBL FILM SLEEVE STRL LF  DISP BLU SIRUS SMS PE 47IN (PROTECTIVE PRODUCTS/GARMENTS) IMPLANT
PACK AVF CS/1 (CUSTOM TRAYS & PACK) ×2
PACK SURG AVF NONST DISP LF (CUSTOM TRAYS & PACK) ×2 IMPLANT
PRS BONE CEM YW MED FEM CNL STRL LF DISP (CANNULA) IMPLANT
SOL IRRG 0.9% NACL 3L PLASTIC CONTAINR UROMATIC LF (SOLUTIONS) IMPLANT
SOLUTION IRRG NS 3000CC 2B7127 4/CS (SOLUTIONS)
STRIP 3X.25IN STRSTRP PLSTR REINF SKNCLS WHT STRL LF (WOUND CARE SUPPLY) ×1 IMPLANT
TRAY SKIN PREP DRY W/SPONGE 4461A 20EA/CS (KITS & TRAYS (DISPOSABLE))
TRAY SKIN SCRUB 8IN VNYL COTTON 6 WNG 6 SPONGE STICK 2 TIP APPL DRY STRL LF (KITS & TRAYS (DISPOSABLE)) IMPLANT

## 2020-08-11 NOTE — H&P (Signed)
Department of Vascular Surgery  [facility]   History and Physical      Date: 08/11/2020  Patient: [name]  DOB: [birth year]  PCP: Ocie Doyne, NP    Chief Complaint:   Need for dialysis access    Subjective:     HPI: [name] is a 54 y.o. White female who presents for evaluation of dialysis access creation.  Patient has CKD stage 5. She has left nephrectomy for renal cell carcinoma and her renal function is progressively worsening.  Patient still produces urine and does not require dialysis.  She was referred to vascular surgery clinic for evaluation of access creation. I saw her in clinic in September and obtained dialysis access evaluation duplex that demonstrated adequate size of left cephalic vein. She is here for elective left brachio-cephalic arteriovenous fistula creation.    Patient is right handed and states that she has very difficult time locating veins for peripheral IVs.  She denies chest pain, shortness of breath, abdominal pain, nausea, vomiting, amaurosis fugax, slurred speech, weakness in 1 arm or 1 leg that comes and goes, fevers, chills, intermittent claudication.    PMH:   Past Medical History:   Diagnosis Date   . Anxiety    . Arthritis    . Cancer (CMS [place]) 2019, 2021    kidney   . Cellulitis 10/24/14    left foot   . 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 Depression    . Diabetes mellitus (CMS [place])    . Diabetes mellitus, type 2 (CMS HCC)    . Essential hypertension    . Fatty liver    . Headache(784.0)    . History of nephrectomy 03/24/2020    L nephrectomy   . Hyperlipidemia    . Hyperthyroidism    . Irritable bowel syndrome    . Macular edema    . Morbid obesity (CMS [place])    . Peripheral neuropathy     bilateral feet   . Type 2 diabetes mellitus (CMS HCC)    . Wears glasses    . White coat hypertension            Family Hx:  Family Medical History:     Problem Relation (Age of Onset)    Breast Cancer Paternal Aunt    Cancer Maternal  Grandmother    Diabetes Father, Paternal Aunt    High Cholesterol Maternal Uncle, Mother    Hypertension (High Blood Pressure) Maternal Uncle, Father              Medications:  Current Facility-Administered Medications   Medication Dose Route Frequency Provider Last Rate Last Admin   . ceFAZolin (ANCEF) 2 g in iso-osmotic 50 mL duplex IVPB  2 g Intravenous PRE-OP Once Kaylyn Lim, MD       . LR premix infusion   Intravenous Continuous Menon, Satish K, DO           Allergies:  Allergies   Allergen Reactions   . Vicodin [Hydrocodone-Acetaminophen] Itching       Past Surgical Hx:  Past Surgical History:   Procedure Laterality Date   . ECTOPIC PREGNANCY SURGERY  05/24/1998   . HX CATARACT REMOVAL Bilateral 2016   . HX CHOLECYSTECTOMY  09/10/96   . HX PELVIC LAPAROSCOPY  05/24/98    Ectopic pregnancy   . HX TUBAL LIGATION  09/10/2012    Filshie Clips   . HYMENECTOMY  1996   .  KIDNEY SURGERY  2019, 2021   . LAPAROSCOPIC PARTIAL NEPHRECTOMY Left 10/24/2017    hand-assisted   . RENAL BIOPSY, PERCUTANEOUS  03/08/2020    clear cell renal cell carcinoma           Social Hx:  Social History     Socioeconomic History   . Marital status: Married     Spouse name: Not on file   . Number of children: Not on file   . Years of education: Not on file   . Highest education level: Not on file   Occupational History   . Not on file   Tobacco Use   . Smoking status: Never Smoker   . Smokeless tobacco: Never Used   Vaping Use   . Vaping Use: Never used   Substance and Sexual Activity   . Alcohol use: No   . Drug use: No   . Sexual activity: Yes     Partners: Male     Birth control/protection: Surgical   Other Topics Concern   . Ability to Walk 1 Flight of Steps without SOB/CP Not Asked   . Routine Exercise Not Asked   . Ability to Walk 2 Flight of Steps without SOB/CP Yes   . Unable to Ambulate Not Asked   . Total Care Not Asked   . Ability To Do Own ADL's Yes   . Uses Walker Not Asked   . Other Activity Level Not Asked   . Uses Cane Not  Asked   Social History Narrative   . Not on file     Social Determinants of Health     Financial Resource Strain: Not on file   Food Insecurity: Not on file   Transportation Needs: Not on file   Physical Activity: Not on file   Stress: Not on file   Intimate Partner Violence: Not on file       Review Of Systems:  As per HPI, all other systems reviewed and are negative    Objective:  Physical Exam:     Constitutional:   LMP 10/24/2014 (Approximate)       Appears in good health and no distress  Eyes:   Conjunctiva clear, eyelids normal.   Pupils equal, round, and reactive to light and accomodation.   ENT:   Ears and nose have normal overall appearance with no lesions or masses.    Lips, teeth and gums have normal overall appearance  Neck:   Supple, symmetrical, trachea midline, no visible masses, no crepitus  Thyroid without visible enlargement, without palpable masses and non-tender  Respiratory:   Non-labored respirations, symmetrical chest expansion, no involvement of accessory muscles  Palpation of chest with symmetrical tactile fremitus, no tenderness and no crepitus   Cardiovascular:    Right radial artery: 2+ (normal), Left radial artery: 2+ (normal)  Right femoral artery: 2+ (normal), Left femoral artery: 2+ (normal)  Right dorsalis pedis artery:  2+ (normal), Left dorsalis pedis artery:  2+ (normal)   Right posterior tibeal artery:  2+ (normal), Left posterior tibial artery: 2+ (normal)   No extremity edema or varicosities  Abdomen:   Soft, non-tender, non-distended, no palpable masses  No palpable enlargement of liver or spleen  Neurologic:   Cranial nerves II - XII grossly intact  Strength 5/5 in all extremities   Sensation to light touch is intact and symmetrical to face, upper and lower extremities  Psychiatric:   Patient is oriented to time, place and person   Mood: Stable  mood and affect       IMAGING/LAB RESULTS:   I reviewed the peripheral vein mapping from 07/08/2020 that demonstrated adequate left  cephalic vein for arteriovenous access creation    Assessment/Plan:    54 year old female status post left nephrectomy for renal cell carcinoma presents with progressive renal failure and CKD 5.  She is scheduled for elective left brachiocephalic fistula creation today.    - Will recheck potassium today prior to surgical intervention, her last potassium was 5.2.  - Proceed to OR for left brachiocephalic fistula creation    Kaylyn Lim, MD

## 2020-08-11 NOTE — OR PreOp (Signed)
RN can lock up belongings while in surgery, patient understands any missing belongings are patients responsibility. Patient instructed to please send any valuable items with family/visitor to ensure no lost or stolen items.

## 2020-08-11 NOTE — Brief Op Note (Signed)
[facility]                                                     BRIEF OPERATIVE NOTE    Patient Name: [name], [name] Number: [id number]  Date of Service: 08/11/2020   Date of Birth: 1966/05/22    All elements must be documented.    Pre-Operative Diagnosis: CKD V   Post-Operative Diagnosis: same  Procedure(s)/Description:  Left upper extremity brachiocephalic fistula creation    Findings/Complexity (inherent to the procedure performed):   Adequate size of cephalic vein     Attending Surgeon: Kaylyn Lim  Assistant(s): Onnie Graham PA-C    Anesthesia Type: General  Estimated Blood Loss:  30cc  Blood Given: 0  Fluids Given: 650PT  Complications (not routinely expected or not inherent to difficulty/nature of procedure):none  Characteristic Event (routinely expected or inherent to the difficulty/nature of the procedure): none  Did the use of current and/or prior Anticoagulants impact the outcome of the case? no  Wound Class: Clean Wound: Uninfected operative wounds in which no inflammation occurred    Tubes: None  Drains: None  Specimens/ Cultures:  None  Implants: none           Disposition: PACU - hemodynamically stable.  Condition: stable    Kaylyn Lim, MD

## 2020-08-11 NOTE — Discharge Summary (Signed)
[facility]  DISCHARGE SUMMARY    PATIENT NAME:  [name], [name]  MRN:  [id number]  DOB:  07-15-1966    ENCOUNTER DATE:  08/11/2020  INPATIENT ADMISSION DATE:   DISCHARGE DATE:  08/11/2020    ATTENDING PHYSICIAN: Kaylyn Lim, MD  SERVICE: [facility]  PRIMARY CARE PHYSICIAN: Ocie Doyne, NP         LAY CAREGIVER:  ,  ,        PRIMARY DISCHARGE DIAGNOSIS: CKD (chronic kidney disease), stage V (CMS [facility])  Active Hospital Problems    Diagnosis Date Noted   . Principle Problem: CKD (chronic kidney disease), stage V (CMS HCC) [N18.5] 08/03/2020      Resolved Hospital Problems   No resolved problems to display.     Active Non-Hospital Problems    Diagnosis Date Noted   . Persistent vomiting 08/20/2019   . Morbid obesity 10/24/2017   . 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 Hyperkalemia 10/08/2017   . AKI (acute kidney injury) (CMS [place]) 10/08/2017   . Slow transit constipation 10/08/2017   . C. difficile diarrhea 12/02/2014   . Essential hypertension 11/30/2014   . Obesity 11/30/2014   . Cellulitis of foot, left 11/29/2014   . Type 2 diabetes mellitus (CMS [place]) 10/25/2014   . Diabetic foot infection (CMS [place]) 10/24/2014        DISCHARGE MEDICATIONS:     Current Discharge Medication List      CONTINUE these medications which have CHANGED during your visit.      Details   tiotropium bromide 18 mcg Capsule, w/Inhalation Device  Commonly known as: SPIRIVA HANDIHALER  What changed:    when to take this   reasons to take this   18 mcg, Inhalation, DAILY  Qty: 14 Cap  Refills: 0        CONTINUE these medications - NO CHANGES were made during your visit.      Details   albuterol sulfate 90 mcg/actuation HFA Aerosol Inhaler  Commonly known as: PROVENTIL or VENTOLIN or PROAIR   2 Puffs, Inhalation, EVERY 4 HOURS PRN  Qty: 2 Inhaler  Refills: 0     ALPRAZolam 0.25 mg Tablet  Commonly known as: XANAX   0.25 mg, Oral  Refills: 0     amLODIPine 2.5 mg Tablet  Commonly known as: NORVASC   2.5 mg,  Oral  Refills: 0     ascorbic acid (vitamin C) 500 mg Tablet  Commonly known as: VITAMIN C   500 mg, Oral, DAILY  Refills: 0     atorvastatin 20 mg Tablet  Commonly known as: LIPITOR   20 mg, Oral, EVERY EVENING  Refills: 0     cholecalciferol (Vitamin D3) 10 mcg/mL (400 unit/mL) Drops  Commonly known as: VI-D-SOL   2,000 Units, Oral, DAILY  Refills: 0     Claritin 10 mg Tablet  Generic drug: loratadine   10 mg, Oral, DAILY PRN  Refills: 0     escitalopram oxalate 10 mg Tablet  Commonly known as: LEXAPRO   10 mg, Oral, NIGHTLY  Refills: 0     Fish Oil-Omega-3 Fatty Acids 360-1,200 mg Capsule   2 Capsules, Oral, 2 TIMES DAILY  Refills: 0     fluticasone propionate 50 mcg/actuation Spray, Suspension  Commonly known as: FLONASE   1 Spray, Each Nostril, DAILY  Refills: 0     furosemide 40 mg Tablet  Commonly known as: LASIX   No dose,  route, or frequency recorded.  Refills: 0     Januvia 50 mg Tablet  Generic drug: SITagliptin   No dose, route, or frequency recorded.  Refills: 0     loperamide 2 mg Capsule  Commonly known as: IMODIUM   2 mg, Oral, EVERY 4 HOURS PRN  Refills: 0     losartan 25 mg Tablet  Commonly known as: COZAAR   25 mg, Oral, DAILY  Qty: 30 Tablet  Refills: 0     magnesium oxide 400 mg Tablet  Commonly known as: MAG-OX   250 mg, Oral, DAILY  Refills: 0          Discharge med list refreshed?  YES                     ALLERGIES:  Allergies   Allergen Reactions   . Vicodin [Hydrocodone-Acetaminophen] Itching             HOSPITAL PROCEDURE(S):   Bedside Procedures:  No orders of the defined types were placed in this encounter.    Surgical Procedure(s):  Left - CREATION FISTULA ARTERIO VENOUS - Wound Class: Clean Wound: Uninfected operative wounds in which no inflammation occurred    REASON FOR HOSPITALIZATION AND HOSPITAL COURSE     BRIEF HPI:  This is a 54 y.o., female admitted for fistula creation    [facility]:         She underwent LUE AVF creation and was discharged to home      TRANSITION/POST DISCHARGE CARE/PENDING TESTS/REFERRALS: none        CONDITION ON DISCHARGE:  A. Ambulation: Full ambulation  B. Self-care Ability: Complete  C. Cognitive Status Alert and Oriented x 3  D. Code status at discharge:   Code Status Information     Code Status Advance Care Planning    Prior Jump to the Activity                 LINES/DRAINS/WOUNDS AT DISCHARGE:   Patient Lines/Drains/Airways Status     Active Line / Dialysis Catheter / Dialysis Graft / Drain / Airway / Wound     Name Placement date Placement time Site Days    EndoTracheal Tube Oral 7.0 Lip 08/11/20  1215   less than 1    Surgical Incision Left Arm 08/11/20  1244   less than 1                DISCHARGE DISPOSITION:  Home discharge              DISCHARGE INSTRUCTIONS:   [facility] [place] .    Specialty: Vascular Surgery  Contact information:  [place] [id number]  [phone]                        ASPIRIN NOT ORDERED AT THIS TIME     DISCHARGE INSTRUCTION - ACTIVITY    Avoid heavy lifting in left upper extremity     Activity: AS TOLERATED      DISCHARGE INSTRUCTION - INCISION/WOUND CARE    Do not peel glue off, it will fall off naturally. You can shower on postoperative day 2, do not rub or soak the wound. Pat the wound to dry     Instructions for incision/wound care: Keep Incision Clean and Dry    Instructions for incision/wound care: NO  Tub Baths / Pools for 1 week    Instructions for incision/wound care: Leave Incision open to air      FOLLOW-UP: [facility] [place] [place]     Follow-up in: 2 WEEKS    Reason for visit: HOSPITAL DISCHARGE    Follow-up reason: s/p LUE AVF creation    Provider: Edmonia Caprio, MD    Copies sent to Care Team       Relationship Specialty Notifications Start End    Ocie Doyne, NP PCP - General EXTERNAL  08/15/19     Phone:  [phone] Fax: [phone]         [place] [id number]            Referring providers can utilize [email or web address] to access their referred [place] patient's information.

## 2020-08-11 NOTE — Anesthesia Postprocedure Evaluation (Signed)
Anesthesia Post Op Evaluation    Patient: [name]  Procedure(s):  CREATION FISTULA ARTERIO VENOUS    Last Vitals:Temperature: 36.7 C (98.1 F) (08/11/20 1439)  Heart Rate: 92 (08/11/20 1456)  BP (Non-Invasive): 129/68 (08/11/20 1456)  Respiratory Rate: 18 (08/11/20 1456)  SpO2: 100 % (08/11/20 7510)    No complications documented.    Patient is sufficiently recovered from the effects of anesthesia to participate in the evaluation and has returned to their pre-procedure level.  Patient location during evaluation: PACU       Patient participation: complete - patient participated  Level of consciousness: awake and alert and responsive to verbal stimuli    Pain management: adequate  Airway patency: patent    Anesthetic complications: no  Cardiovascular status: acceptable  Respiratory status: acceptable  Hydration status: acceptable  Patient post-procedure temperature: Pt Normothermic   PONV Status: Absent  Comments: Discharged by criteria per RN.

## 2020-08-11 NOTE — Anesthesia Preprocedure Evaluation (Addendum)
ANESTHESIA PRE-OP EVALUATION  Planned Procedure: CREATION FISTULA ARTERIO VENOUS (Left )  Review of Systems         patient summary reviewed  nursing notes reviewed        Pulmonary     Cardiovascular    Hypertension and ECG reviewed ,No peripheral edema,        GI/Hepatic/Renal    Increased K+ and acute renal failure     Endo/Other    hyperthyroidism,   type 2 diabetes     Neuro/Psych/MS    anxiety and depression     Cancer  CA,                  Physical Assessment      Patient summary reviewed and Nursing notes reviewed   Airway       Mallampati: III    TM distance: >3 FB    Neck ROM: full  Mouth Opening: fair.      No endotracheal tube present  No Tracheostomy present    Dental           (+) poor dentition           Pulmonary    Breath sounds clear to auscultation  (-) no rhonchi, no decreased breath sounds, no wheezes, no rales and no stridor     Cardiovascular    Rhythm: regular  Rate: Normal  (-) no friction rub, carotid bruit is not present, no peripheral edema and no murmur     Other findings            Plan  ASA 4     Planned anesthesia type: general                 Intravenous induction     Anesthesia issues/risks discussed are: PONV, Aspiration, Post-op Cognitive Dysfunction, Cardiac Events/MI and Dental Injuries.  Anesthetic plan and risks discussed with patient.          Patient's NPO status is appropriate for Anesthesia.           Plan discussed with CRNA.    (Pt has relative increase in K will recheck lab but proceed with case.)

## 2020-08-11 NOTE — OR Surgeon (Signed)
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    NAME OF PROCEDURE:  Left upper extremity brachiocephalic direct arteriovenous fistula creation. ([id number] mod AS)    SURGEON:  Kaylyn Lim, MD.    ASSISTANT:  Onnie Graham, PA-C, (assisted with retraction during the case, following the suture as well as wound closure after completion of the procedure).    ANESTHESIA:  General endotracheal anesthesia and local 1% lidocaine.    ESTIMATED BLOOD LOSS:  30 mL.    COMPLICATIONS:  None.    INDICATIONS FOR PROCEDURE:  Ms. [name] is a 54 year old female who underwent previous left nephrectomy for renal cell carcinoma.  Her kidney function progressively got worse to chronic kidney disease stage 5.  She was referred by nephrology to my clinic for evaluation of dialysis access creation.  Duplex for a vein mapping was performed and demonstrated adequate left cephalic vein for access creation.  Indications, risks, benefits, and alternatives were discussed with the patient and she agreed to proceed and signed informed consent.    OPERATIVE FINDINGS:  The left cephalic vein was about 4 mm in size and was adequate for creation of an arteriovenous access.  There was a dopplerable signal in the left radial and left ulnar artery at the conclusion of the case as well as a thrill in the fistula itself.    DESCRIPTION OF PROCEDURE:  After appropriate consent was obtained, the patient was brought to the operating room and placed supine on the operative table.  Anesthesia proceeded to sedating and intubating the patient.  We performed a time-out procedure.  The identity of the patient was checked and site of surgery checked.  She received 3 grams of  Ancef for perioperative antibiotic prophylaxis.  We then prepped and draped her left upper extremity in the usual sterile fashion.  We started by making a transverse incision just more proximal to the antecubital fossa.  The subcutaneous tissue was divided using electrocautery, blunt and sharp dissection down to the cephalic vein.  The cephalic vein was isolated proximally and distally and multiple branches were ligated with 2-0 silk.  The vein was divided distally and the distal part was ligated with a 2-0 silk.  The vein was flushed and there was good flushing and blood return in the vein and it seemed adequate for access creation.  We then turned our attention to the left brachial artery.  The artery was dissected using electrocautery, blunt and sharp dissection.  Proximal and distal control was obtained and 2000 units of IV heparin was administered and the artery was clamped.  It was opened with an 11 blade in a longitudinal fashion and the arteriotomy was extended using Potts scissors.  I then sutured the vein to the artery using a 6-0 running Prolene suture.  Prior to tying the last knot, we flushed and de-aired all the vessels.  Once I completed the knot, I had to put 1 hemostatic 6-0 Prolene suture.  There was an excellent thrill in the fistula and dopplerable signal at the radial and ulnar artery.  I was satisfied with the result and hemostasis was achieved.  The wound was closed in layers using 3-0 Vicryl to the subcutaneous  tissue and 4-0 Monocryl to the skin.  Sterile glue was applied as well as local lidocaine solution.  The patient tolerated the procedure well, was extubated and transferred to PACU in good condition.        Kaylyn Lim, MD  Assistant Professor              DD:  08/11/2020 13:44:14  DT:  08/11/2020 15:08:58 LL  D#:  [id number]

## 2020-08-11 NOTE — Anesthesia Transfer of Care (Signed)
ANESTHESIA TRANSFER OF CARE   [name] is a 54 y.o. ,female,     had Procedure(s):  CREATION FISTULA ARTERIO VENOUS  performed  08/11/20   Primary Service: Kaylyn Lim, MD    Past Medical History:   Diagnosis Date   . Anxiety    . Arthritis    . Cancer (CMS [place]) 2019, 2021    kidney   . Cellulitis 10/24/14    left foot   . 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 Depression    . Diabetes mellitus (CMS [place])    . Diabetes mellitus, type 2 (CMS HCC)    . Essential hypertension    . Fatty liver    . Headache(784.0)    . History of nephrectomy 03/24/2020    L nephrectomy   . Hyperlipidemia    . Hyperthyroidism    . Irritable bowel syndrome    . Macular edema    . Morbid obesity (CMS [place])    . Peripheral neuropathy     bilateral feet   . Type 2 diabetes mellitus (CMS HCC)    . Wears glasses    . White coat hypertension       Allergy History as of 08/11/20     HYDROCODONE-ACETAMINOPHEN       Noted Status Severity Type Reaction    10/24/14 1031 Oretha Milch, RN 10/24/14 Active Low  Itching              I completed my transfer of care / handoff to the receiving personnel during which we discussed:  Access, Airway, All key/critical aspects of case discussed, Analgesia, Antibiotics, Expectation of post procedure, Fluids/Product, Gave opportunity for questions and acknowledgement of understanding, Labs and PMHx                                                                      Last OR Temp: Temperature: 36.7 C (98.1 F)  ABG:  POTASSIUM   Date Value Ref Range Status   08/11/2020 4.8 3.5 - 5.1 mmol/L Final     Comment:     Results can be 0.1-0.3 mmol/L lower in plasma than serum, owing to lack of platelet activation in plasma specimens.   12/07/2014 4.1 3.5 - 5.0 mmol/L Final     KETONES   Date Value Ref Range Status   03/31/2020 Negative Negative mg/dL Final     KETONES,URINE   Date Value Ref Range Status   09/02/2012 NEGATIVE NEGATIVE mg/dL Final     CALCIUM   Date Value Ref Range Status    08/10/2020 8.4 (L) 8.5 - 10.0 mg/dL Final   12/07/2014 9.3 8.5 - 10.5 mg/dL Final     Calculated P Axis   Date Value Ref Range Status   03/15/2020 13 degrees Final     Calculated T Axis   Date Value Ref Range Status   03/15/2020 43 degrees Final     Airway:* No LDAs found *  Blood pressure (!) 178/75, pulse (!) 102, temperature 36.7 C (98.1 F), resp. rate 18, last menstrual period 10/24/2014, SpO2 98 %.

## 2020-08-11 NOTE — Nurses Notes (Signed)
Patient tolerating PO fluids and crackers, VSS, Ambulating without difficulty, urinating without difficulty. Patient discharged home with family. Reviewed discharge instructions with patient and, a written copy of AVS and DC instructions were given to patient. Questions sufficiently answered as needed. Patient encouraged to follow up with PCP as indicated. In the event of an emergency, patient/caregiver instructed to call 911 or go nearest emergency room. Patient escorted by [facility] by this RN and daughter drove home.

## 2020-08-13 ENCOUNTER — Encounter (INDEPENDENT_AMBULATORY_CARE_PROVIDER_SITE_OTHER): Payer: Self-pay | Admitting: VASCULAR SURGERY

## 2020-08-26 ENCOUNTER — Ambulatory Visit (INDEPENDENT_AMBULATORY_CARE_PROVIDER_SITE_OTHER): Payer: 59 | Admitting: VASCULAR SURGERY

## 2020-08-26 ENCOUNTER — Other Ambulatory Visit: Payer: Self-pay

## 2020-08-26 ENCOUNTER — Encounter (INDEPENDENT_AMBULATORY_CARE_PROVIDER_SITE_OTHER): Payer: Self-pay | Admitting: VASCULAR SURGERY

## 2020-08-26 VITALS — BP 148/86 | HR 80 | Resp 17 | Ht 67.0 in | Wt 279.0 lb

## 2020-08-26 DIAGNOSIS — N185 Chronic kidney disease, stage 5: Secondary | ICD-10-CM

## 2020-08-26 NOTE — Progress Notes (Signed)
Department of Vascular Surgery  [facility] History and Physical      Date: 08/26/2020  Patient: [name]  DOB: 1966/03/04  PCP: Ocie Doyne, NP    Chief Complaint:   Follow up after left arteriovenous fistula creation    Subjective:     HPI: [name] is a 54 y.o. White female who presents after creation of LUE brachiocephalic AV fistula on 16/10/96. Patient has some coolness in the left arm but it's tolerable. Incision pain is minimal. Patient wants to return to work next Monday on 12/6     She denies chest pain, shortness of breath, abdominal pain, nausea, vomiting, amaurosis fugax, slurred speech, weakness in 1 arm or 1 leg that comes and goes, fevers, chills, intermittent claudication.    PMH:   Past Medical History:   Diagnosis Date   . Anxiety    . Arthritis    . Cancer (CMS [place]) 2019, 2021    kidney   . Cellulitis 10/24/14    left foot   . 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 Depression    . Diabetes mellitus (CMS [place])    . Diabetes mellitus, type 2 (CMS HCC)    . Essential hypertension    . Fatty liver    . Headache(784.0)    . History of nephrectomy 03/24/2020    L nephrectomy   . Hyperlipidemia    . Hyperthyroidism    . Irritable bowel syndrome    . Macular edema    . Morbid obesity (CMS [place])    . Peripheral neuropathy     bilateral feet   . Type 2 diabetes mellitus (CMS HCC)    . Wears glasses    . White coat hypertension            Family Hx:  Family Medical History:     Problem Relation (Age of Onset)    Breast Cancer Paternal Aunt    Cancer Maternal Grandmother    Diabetes Father, Paternal Aunt    High Cholesterol Maternal Uncle, Mother    Hypertension (High Blood Pressure) Maternal Uncle, Father              Medications:  Current Outpatient Medications   Medication Sig Dispense Refill   . albuterol sulfate (PROVENTIL OR VENTOLIN OR PROAIR) 90 mcg/actuation Inhalation HFA Aerosol Inhaler Take 2 Puffs by inhalation Every 4 hours as needed 2  Inhaler 0   . ALPRAZolam (XANAX) 0.25 mg Oral Tablet Take 0.25 mg by mouth     . amLODIPine (NORVASC) 2.5 mg Oral Tablet Take 2.5 mg by mouth     . ascorbic acid, vitamin C, (VITAMIN C) 500 mg Oral Tablet Take 500 mg by mouth Once a day      . atorvastatin (LIPITOR) 20 mg Oral Tablet Take 20 mg by mouth Every evening     . cholecalciferol, Vitamin D3, (VI-D-SOL) 10 mcg/mL (400 unit/mL) Oral Drops Take 2,000 Units by mouth Once a day      . escitalopram oxalate (LEXAPRO) 10 mg Oral Tablet Take 10 mg by mouth Every night     . Fish Oil-Omega-3 Fatty Acids 360-1,200 mg Oral Capsule Take 2 Caps by mouth Twice daily     . fluticasone (FLONASE) 50 mcg/actuation Nasal Spray, Suspension 1 Spray by Each Nostril route Once a day     . furosemide (LASIX) 40 mg Oral Tablet      . JANUVIA 50 mg  Oral Tablet      . loperamide (IMODIUM) 2 mg Oral Capsule Take 2 mg by mouth Every 4 hours as needed     . loratadine (CLARITIN) 10 mg Oral Tablet Take 10 mg by mouth Once per day as needed      . losartan (COZAAR) 25 mg Oral Tablet Take 1 Tablet (25 mg total) by mouth Once a day for 30 days 30 Tablet 0   . magnesium oxide (MAG-OX) 400 mg (241.3 mg magnesium) Oral Tablet Take 250 mg by mouth Once a day      . tiotropium bromide (SPIRIVA HANDIHALER) 18 mcg Inhalation Capsule, w/Inhalation Device Take 1 Cap (18 mcg total) by inhalation Once a day for 14 days (Patient taking differently: Take 18 mcg by inhalation Once per day as needed ) 14 Cap 0     No current facility-administered medications for this visit.       Allergies:  Allergies   Allergen Reactions   . Vicodin [Hydrocodone-Acetaminophen] Itching       Past Surgical Hx:  Past Surgical History:   Procedure Laterality Date   . ECTOPIC PREGNANCY SURGERY  05/24/1998   . HX CATARACT REMOVAL Bilateral 2016   . HX CHOLECYSTECTOMY  09/10/96   . HX PELVIC LAPAROSCOPY  05/24/98    Ectopic pregnancy   . HX TUBAL LIGATION  09/10/2012    [place]ie Clips   . HYMENECTOMY  1996   . KIDNEY SURGERY   2019, 2021   . LAPAROSCOPIC PARTIAL NEPHRECTOMY Left 10/24/2017    hand-assisted   . RENAL BIOPSY, PERCUTANEOUS  03/08/2020    clear cell renal cell carcinoma           Social Hx:  Social History     Socioeconomic History   . Marital status: Married     Spouse name: Not on file   . Number of children: Not on file   . Years of education: Not on file   . Highest education level: Not on file   Occupational History   . Not on file   Tobacco Use   . Smoking status: Never Smoker   . Smokeless tobacco: Never Used   Vaping Use   . Vaping Use: Never used   Substance and Sexual Activity   . Alcohol use: No   . Drug use: No   . Sexual activity: Yes     Partners: Male     Birth control/protection: Surgical   Other Topics Concern   . Ability to Walk 1 Flight of Steps without SOB/CP Not Asked   . Routine Exercise Not Asked   . Ability to Walk 2 Flight of Steps without SOB/CP Yes   . Unable to Ambulate Not Asked   . Total Care Not Asked   . Ability To Do Own ADL's Yes   . Uses Walker Not Asked   . Other Activity Level Not Asked   . Uses Cane Not Asked   Social History Narrative   . Not on file     Social Determinants of Health     Financial Resource Strain: Not on file   Food Insecurity: Not on file   Transportation Needs: Not on file   Physical Activity: Not on file   Stress: Not on file   Intimate Partner Violence: Not on file       Review Of Systems:  As per HPI, all other systems reviewed and are negative    Objective:  Physical Exam:  Constitutional:   BP (!) 148/86   Pulse 80   Resp 17   Ht 1.702 m (5\' 7" )   Wt 127 kg (279 lb)   LMP 10/24/2014 (Approximate)   SpO2 99%   BMI 43.70 kg/m       Appears in good health and no distress  Eyes:   Conjunctiva clear, eyelids normal.   Pupils equal, round, and reactive to light and accomodation.   ENT:   Ears and nose have normal overall appearance with no lesions or masses.    Lips, teeth and gums have normal overall appearance  Neck:   Supple, symmetrical, trachea midline,  no visible masses, no crepitus  Thyroid without visible enlargement, without palpable masses and non-tender  Respiratory:   Non-labored respirations, symmetrical chest expansion, no involvement of accessory muscles  Palpation of chest with symmetrical tactile fremitus, no tenderness and no crepitus   Cardiovascular:    Right radial artery: 2+ (normal), Left radial artery: 2+ (normal)  Left brachiocephalic fistula with excellent thrill. Incision well healed.  Abdomen:   Soft, non-tender, non-distended, no palpable masses  No palpable enlargement of liver or spleen  Neurologic:   Cranial nerves II - XII grossly intact  Strength 5/5 in all extremities   Sensation to light touch is intact and symmetrical to face, upper and lower extremities  Psychiatric:   Patient is oriented to time, place and person   Mood: Stable mood and affect       IMAGING/LAB RESULTS:   N/A    Assessment/Plan:  Chronic kidney disease, stage V - s/p LUE BC AVF creation on 08/11/20, patient has recovered well and incision is healed. I will see the patient in 4 weeks to evaluate fistula maturation and clear for dialysis. In the meantime, I educated patient on signs of steal syndrome and told her to call me if she has any worsening of her LUE symptoms.    Patient was given the opportunity to ask questions and those questions were answered to their satisfaction. Instructed to call with any further questions or concerns.       Kaylyn Lim, MD

## 2020-09-04 ENCOUNTER — Ambulatory Visit
Admission: RE | Admit: 2020-09-04 | Discharge: 2020-09-04 | Disposition: A | Discharge status: Home / Self Care | Payer: 59 | Source: Ambulatory Visit | Attending: Nephrology | Admitting: Nephrology

## 2020-09-04 DIAGNOSIS — E1129 Type 2 diabetes mellitus with other diabetic kidney complication: Secondary | ICD-10-CM | POA: Insufficient documentation

## 2020-09-04 DIAGNOSIS — N185 Chronic kidney disease, stage 5: Secondary | ICD-10-CM | POA: Insufficient documentation

## 2020-09-04 DIAGNOSIS — I10 Essential (primary) hypertension: Secondary | ICD-10-CM | POA: Insufficient documentation

## 2020-09-04 DIAGNOSIS — N2581 Secondary hyperparathyroidism of renal origin: Secondary | ICD-10-CM | POA: Insufficient documentation

## 2020-09-04 LAB — RENAL FUNCTION PANEL
Albumin: 2.9 gm/dL — ABNORMAL LOW (ref 3.5–5.0)
Anion Gap: 17.6 mMol/L (ref 7.0–18.0)
BUN / Creatinine Ratio: 12.1 Ratio (ref 10.0–30.0)
BUN: 59 mg/dL — ABNORMAL HIGH (ref 7–22)
CO2: 18.9 mMol/L — ABNORMAL LOW (ref 20.0–30.0)
Calcium: 8.7 mg/dL (ref 8.5–10.5)
Chloride: 111 mMol/L — ABNORMAL HIGH (ref 98–110)
Creatinine: 4.87 mg/dL — ABNORMAL HIGH (ref 0.60–1.20)
EGFR: 10 mL/min/{1.73_m2} — ABNORMAL LOW (ref 60–150)
Glucose: 146 mg/dL — ABNORMAL HIGH (ref 71–99)
Osmolality Calculated: 302 mOsm/kg — ABNORMAL HIGH (ref 275–300)
Phosphorus: 5.6 mg/dL — ABNORMAL HIGH (ref 2.3–4.7)
Potassium: 5.5 mMol/L — ABNORMAL HIGH (ref 3.5–5.3)
Sodium: 142 mMol/L (ref 136–147)

## 2020-09-21 ENCOUNTER — Encounter (HOSPITAL_COMMUNITY): Payer: 59 | Admitting: Internal Medicine

## 2020-09-23 ENCOUNTER — Encounter (INDEPENDENT_AMBULATORY_CARE_PROVIDER_SITE_OTHER): Payer: Self-pay | Admitting: VASCULAR SURGERY

## 2020-09-24 ENCOUNTER — Other Ambulatory Visit (RURAL_HEALTH_CENTER): Payer: Self-pay | Admitting: Gerontology

## 2020-09-24 ENCOUNTER — Telehealth (RURAL_HEALTH_CENTER): Payer: Self-pay

## 2020-09-24 ENCOUNTER — Emergency Department
Admission: EM | Admit: 2020-09-24 | Discharge: 2020-09-24 | Disposition: A | Discharge status: Home / Self Care | Payer: 59 | Attending: Personal Emergency Response Attendant | Admitting: Personal Emergency Response Attendant

## 2020-09-24 DIAGNOSIS — L03116 Cellulitis of left lower limb: Secondary | ICD-10-CM

## 2020-09-24 MED ORDER — CLINDAMYCIN HCL 300 MG PO CAPS
300.0000 mg | ORAL_CAPSULE | Freq: Three times a day (TID) | ORAL | 0 refills | Status: AC
Start: 2020-09-24 — End: 2020-10-01

## 2020-09-24 MED ORDER — CEPHALEXIN 250 MG PO CAPS
250.0000 mg | ORAL_CAPSULE | Freq: Three times a day (TID) | ORAL | 0 refills | Status: DC
Start: 2020-09-24 — End: 2020-09-28

## 2020-09-24 NOTE — Telephone Encounter (Signed)
Sounds more reasonable, she still has to be careful related to her kidneys, so that is wise

## 2020-09-24 NOTE — ED Provider Notes (Signed)
[facility]  Emergency Department       Patient Name: ALISSA, PHARR A Patient DOB:  [birth year]   Encounter Date:  09/24/2020 Age: 54 y.o. female   Attending ED Physician: Flonnie Hailstone. Marion Downer, MD MRN:  [id number]   Room:  EX7/EX7-A PCP: Ocie Doyne, NP      Diagnosis / Disposition:   Final Impression  1. Cellulitis of left foot        Disposition  ED Disposition     ED Disposition Condition Date/Time Comment    Discharge  Fri Sep 24, 2020 12:44 PM Sigurd Sos discharge to home/self care.    Condition at disposition: Stable          Follow up  Ocie Doyne, NP  [address]  [place] [address]  [phone]      in 3-4 days for a re-check           If you have any worsening, new or concerning symptoms, please return to the emergency department for re-evaluation.       Prescriptions  New Prescriptions    CLINDAMYCIN (CLEOCIN) 300 MG CAPSULE    Take 1 capsule (300 mg total) by mouth 3 (three) times daily for 7 days         History of Presenting Illness:   Chief complaint: Wound Infection    HPI/ROS is limited by: none  HPI/ROS given by: patient    Nursing Notes reviewed and acknowledged.    HARLEY MCCARTNEY is a 54 y.o. female who presents for evaluation of left foot redness. She says that she removed a splinter from the bottom of her left foot on Monday, today she has redness of the foot. She denies fevers/chills, chest pain, dyspnea, abdominal pain, n/v/d, or any other symptoms at this time. She denies drainage or bleeding from the site.     In addition to the above history, please see nursing notes. Allergies, meds, past medical, family, social hx, and the results of the diagnostic studies performed have been reviewed by myself.      Review of Systems   Pertinent ROS as per HPI  Patient reports they have otherwise been in their usual state of health      Allergies / Medications:   Pt is allergic to hydrocodone-acetaminophen.    Current/Home Medications    ALPRAZOLAM (XANAX) 0.25  MG TABLET    Take 1 tablet (0.25 mg total) by mouth 2 (two) times daily as needed for Anxiety    AMLODIPINE (NORVASC) 2.5 MG TABLET    Take 1 tablet by mouth daily    ATORVASTATIN (LIPITOR) 20 MG TABLET        CALCITRIOL (ROCALTROL) 0.25 MCG CAPSULE    Take 0.25 mcg by mouth    CEPHALEXIN (KEFLEX) 250 MG CAPSULE    Take 1 capsule (250 mg total) by mouth 3 (three) times daily for 5 days    ESCITALOPRAM (LEXAPRO) 10 MG TABLET    Take 1 tablet (10 mg total) by mouth daily    FUROSEMIDE (LASIX) 40 MG TABLET    Take 1 tablet (40 mg total) by mouth daily    JANUVIA 50 MG TABLET        LORATADINE (CLARITIN) 10 MG TABLET    Take 10 mg by mouth daily.    MAGNESIUM OXIDE (MAG-OX) 400 MG TABLET    Take 400 mg by mouth daily.    OMEGA-3 FATTY ACIDS (FISH OIL) 1000 MG CAP  CAPSULE    Take 1 capsule by mouth 2 (two) times daily         Past History:   Medical: Pt has a past medical history of Anxiety, Arthritis, Cellulitis, Depression, Diabetes mellitus, Fatty liver, Irritable bowel syndrome, Malignant neoplasm, Obesity, and White coat hypertension.    Surgical: Pt  has a past surgical history that includes Cholecystectomy; Pelvic laparoscopy; Tubal ligation; Abdominal surgery; Gallbladder surgery (09/11/1996); Ectopic pregnancy surgery (05/25/1998); Tubal ligation (09/10/2012); Cataract extraction (2016); Kidney surgery (10/24/2017); Trigger finger release (03/31/2016); Nephrectomy (Left, 03/24/2020); Eye surgery; and AV fistula placement.    Family: The family history includes Alzheimer's disease in her paternal grandmother; Breast cancer in her maternal grandmother; Diabetes in her father; Hyperlipidemia in her mother; Hypertension in her father; Leukemia in her paternal grandfather.    Social: Pt reports that she has never smoked. She has never used smokeless tobacco. She reports that she does not drink alcohol and does not use drugs.      Physical Exam:   Constitutional: Vital signs reviewed. Well appearing.  Head:  Normocephalic, atraumatic  Eyes: Conjunctiva and sclera are normal.  No injection or discharge.  Ears, Nose, Throat:  Normal external examination of the nose and ears.    Neck: Normal range of motion.   Respiratory/Chest: No respiratory distress.   Extremities: No deformity. LLE: there is a wound on the ball of the foot, no surrounding erythema, no drainage/bleeding. On the medial foot, around the bridge of the foot, there is a small area of erythema/warmth, no crepitus, no streaking erythema. 2+ DP/PT pulses. Cap refill <3 seconds, sensation to light touch intact.   Neurological: No focal motor deficits by observation. Speech normal.  Skin: Warm and dry.   Psychiatric: Alert and conversant.  Normal affect.  Normal insight.        MDM:   This is a pleasant 54 year old female presenting for evaluation of redness of her foot, she had a splinter in her foot earlier this week which she removed. She is nontoxic. No fever. No evidence of necrotizing infection. Will treat as cellulitis. She did call her PCP today and per records, has keflex called in to the pharmacy. She has renal failure, and so I will prescribe clindamycin and discharge home with PCP follow up, patient is in agreement with this plan of care.       Course in ED:         Diagnostic Results:   The results of the diagnostic studies have been reviewed by myself:    Radiologic Studies  No results found.    Lab Studies  Labs Reviewed - No data to display      Procedure / EKG:   No procedures were performed during this encounter.             ATTESTATIONS   This chart was generated by an EMR and may contain errors or additions/omissions not intended by the user.         Flonnie Hailstone. Marion Downer, M.D.             Judee Clara, MD  09/24/20 [id number]

## 2020-09-24 NOTE — Telephone Encounter (Signed)
Spoke with patient, she said that she has not started dialysis yet. Informed her of Keflex being sent. She said that she is actually going to go on over to ED because its worrying her of how red it is and she doesn't want to chance it.

## 2020-09-24 NOTE — Telephone Encounter (Signed)
Patient called stating that she had gotten a splinter in the bottom of her left foot, she pulled it out last night. But when she woke up today its swollen, red and inflamed. Patient wanted recommendation?  Prescribe abx?

## 2020-09-24 NOTE — Telephone Encounter (Signed)
She is on dialysis, the safest we can do is Amoxicillin or Keflex, I will send low dose Keflex, on her dialysis days she takes after treatment, if it becomes more swollen or tender, ED

## 2020-09-24 NOTE — Discharge Instructions (Signed)
Discharge Instructions for Cellulitis  You have been diagnosed with cellulitis. This is an infection in the deepest layer of the skin and tissue beneath the skin. In some cases, the infection also affects the muscle. Cellulitis is caused by bacteria. The bacteria canenter the body through broken skin. This can happen with a cut, scratch, animal bite, or an insect bite that has been scratched. You may have been treated in the hospital with antibiotics and fluids. You will likely be given a prescription for antibiotics to take at home. This sheet will help you take care of yourself at home.  Home care  When you are home:   Take the prescribed antibiotic medicine you are given as directed until it is gone. Take it even if you feel better. It treats the infection and stops it from returning. Not taking all the medicine can make future infections hard to treat.   Keep the infected area clean.   When possible, raise the infected area above the level of your heart. This helps keep swelling down.   Talk with your healthcare provider if you are in pain. Ask what kind of over-the-counter medicine you can take for pain.   Apply clean bandages as advised.   Take your temperature once a day for a week.   Wash your hands often to prevent spreading the infection.  In the future, wash your hands before and after you touch cuts, scratches, or bandages. This will help prevent infection.  When to call your healthcare provider  Call your healthcare provider right away if you have any of the following:   Trouble or pain when moving the joints above or below the infected area   Discharge or pus draining from the area   Feverof100.4F (38C) or higher, or as directed by your healthcare provider   Pain that gets worse in or around the infected   Redness that gets worse in or around the infected area,particularly if the area of redness expands to a wider area   Shaking chills   Swelling of the infected  area   Vomiting  [employer] last reviewed this educational content on 07/26/2018   2000-2021 [employer]. All rights reserved. This information is not intended as a substitute for professional medical care. Always follow your healthcare professional's instructions.

## 2020-09-24 NOTE — ED Triage Notes (Signed)
Pt removed a splinter from the bottom of her left foot on Monday. Awoke this morning with redness to the inside of her left foot. Denies fevers.

## 2020-09-28 ENCOUNTER — Encounter (RURAL_HEALTH_CENTER): Payer: Self-pay | Admitting: Gerontology

## 2020-09-28 ENCOUNTER — Ambulatory Visit: Payer: 59 | Attending: Gerontology | Admitting: Gerontology

## 2020-09-28 VITALS — BP 140/86 | HR 88 | Temp 97.1°F | Resp 18 | Ht 67.0 in | Wt 286.0 lb

## 2020-09-28 DIAGNOSIS — I10 Essential (primary) hypertension: Secondary | ICD-10-CM

## 2020-09-28 DIAGNOSIS — F419 Anxiety disorder, unspecified: Secondary | ICD-10-CM

## 2020-09-28 DIAGNOSIS — L03116 Cellulitis of left lower limb: Secondary | ICD-10-CM

## 2020-09-28 DIAGNOSIS — N184 Chronic kidney disease, stage 4 (severe): Secondary | ICD-10-CM

## 2020-09-28 DIAGNOSIS — Z09 Encounter for follow-up examination after completed treatment for conditions other than malignant neoplasm: Secondary | ICD-10-CM

## 2020-09-28 DIAGNOSIS — Z23 Encounter for immunization: Secondary | ICD-10-CM

## 2020-09-28 MED ORDER — ALPRAZOLAM 0.25 MG PO TABS
0.2500 mg | ORAL_TABLET | Freq: Two times a day (BID) | ORAL | 0 refills | Status: DC | PRN
Start: 2020-09-28 — End: 2021-08-08

## 2020-09-28 NOTE — Progress Notes (Signed)
Patient received Tdap Vaccine today. Injection given IM Right Deltoid. See Immunizations tab for more details. Written consent obtained. Patient is afebrile. Patient tolerated injection and vaccine well. No immediate reaction noted at this time. VIS given and/or offered to patient. Advised patient to call office with any questions and/or concerns. Patient voices understanding.

## 2020-09-28 NOTE — Progress Notes (Signed)
Subjective:     Patient ID: Kelli Carlson is a 55 y.o. female.  Chief Complaint   Patient presents with   . Hospital Follow-up     cellulitis left foot [facility] ED 09/24/20   . Medication Problem     Antibiotics are upsetting her stomach. She started probiotics on Sunday.   . Medication Refill     Xanax: would like to have another RX       HPI  Follow up from ED visit  COPIED from ED record  Kelli Carlson is a 55 y.o. female who presents for evaluation of left foot redness. She says that she removed a splinter from the bottom of her left foot on Monday, today she has redness of the foot. She denies fevers/chills, chest pain, dyspnea, abdominal pain, n/v/d, or any other symptoms at this time. She denies drainage or bleeding from the site.       Is still not on HD, Dr Judeth Horn is waiting to see if her renal function improves, her EGFR is currently 10    She did well with previous prescription for low dose Xanax for anxiety    She will need tetanus booster    The following portions of the patient's history were reviewed and updated as appropriate: allergies, current medications, past family history, past medical history, past social history, past surgical history and problem list.    Social History     Socioeconomic History   . Marital status: Married   Tobacco Use   . Smoking status: Never Smoker   . Smokeless tobacco: Never Used   Vaping Use   . Vaping Use: Never used   Substance and Sexual Activity   . Alcohol use: No     Alcohol/week: 0.0 standard drinks   . Drug use: No   . Sexual activity: Yes     Review of Systems   Constitutional: Negative for fatigue and fever.   HENT: Negative.    Respiratory: Negative.    Cardiovascular: Negative.    Skin: Positive for rash and wound.                       Objective:    Physical Exam  Vitals and nursing note reviewed.   Constitutional:       Appearance: Normal appearance.   HENT:      Head: Normocephalic.   Cardiovascular:      Rate and Rhythm: Normal rate.   Musculoskeletal:         Feet:    Feet:      Left foot:      Skin integrity: No erythema or warmth.   Skin:            Comments: Fistula site is healing   Neurological:      Mental Status: She is alert.       Blood pressure 140/86, pulse 88, temperature 97.1 F (36.2 C), temperature source Temporal, resp. rate 18, height 1.702 m (5\' 7" ), weight 129.7 kg (286 lb).        Medical Decision Making:   1. Cellulitis of left lower extremity  Continue Clindamycin, the area is much improved per her report, no pain, np drainage, no erythema    2. Need for tetanus booster  - Tdap vaccine greater than or equal to 7yo IM  After obtaining informed consent, the immunization is given by staff.    3. Hospital discharge follow-up  Reviewed the visit dated 09/24/20  4. Anxiety  - ALPRAZolam (Xanax) 0.25 MG tablet; Take 1 tablet (0.25 mg total) by mouth 2 (two) times daily as needed for Anxiety  Dispense: 60 tablet; Refill: 0    5. Essential hypertension  - losartan (COZAAR) 50 MG tablet  BP Readings from Last 3 Encounters:   09/28/20 140/86   09/24/20 (!) 187/94   06/16/20 (!) 198/118     It is improved with Losartan    6. Stage 4 chronic kidney disease  Dr Judeth Horn is waiting to se if renal function improves, has not started HD as of yet, she did have a fistula placed in right upper arm    No follow-ups on file.      Follow up sooner if no improvement or if symptoms worsen or persist.   Medication dosing instructions, drug-drug interactions and potential side effects discussed in detail.   Failure to comply with plan of care discussed with patient. Importance of adhering to follow up office visits discussed in detail.   Potential risks of noncompliance discussed with patient.    Ocie Doyne, NP

## 2020-10-05 ENCOUNTER — Telehealth (RURAL_HEALTH_CENTER): Payer: Self-pay

## 2020-10-05 MED ORDER — ALBUTEROL SULFATE HFA 108 (90 BASE) MCG/ACT IN AERS
2.0000 | INHALATION_SPRAY | Freq: Four times a day (QID) | RESPIRATORY_TRACT | 3 refills | Status: DC | PRN
Start: 2020-10-05 — End: 2021-10-25

## 2020-10-05 NOTE — Telephone Encounter (Signed)
Proventil inhaler, needs new RX (old one expired)  [place] BS

## 2020-10-05 NOTE — Telephone Encounter (Signed)
ok 

## 2020-10-16 ENCOUNTER — Emergency Department (HOSPITAL_COMMUNITY): Payer: 59

## 2020-10-16 ENCOUNTER — Encounter (HOSPITAL_COMMUNITY): Payer: Self-pay

## 2020-10-16 ENCOUNTER — Other Ambulatory Visit: Payer: Self-pay

## 2020-10-16 ENCOUNTER — Inpatient Hospital Stay
Admission: EM | Admit: 2020-10-16 | Discharge: 2020-10-20 | DRG: 673 | Disposition: A | Discharge status: Home / Self Care | Payer: 59 | Attending: INTERNAL MEDICINE | Admitting: INTERNAL MEDICINE

## 2020-10-16 DIAGNOSIS — E8770 Fluid overload, unspecified: Secondary | ICD-10-CM | POA: Insufficient documentation

## 2020-10-16 DIAGNOSIS — E11311 Type 2 diabetes mellitus with unspecified diabetic retinopathy with macular edema: Secondary | ICD-10-CM | POA: Diagnosis present

## 2020-10-16 DIAGNOSIS — I160 Hypertensive urgency: Secondary | ICD-10-CM | POA: Diagnosis present

## 2020-10-16 DIAGNOSIS — Z8616 Personal history of COVID-19: Secondary | ICD-10-CM

## 2020-10-16 DIAGNOSIS — E1165 Type 2 diabetes mellitus with hyperglycemia: Secondary | ICD-10-CM | POA: Diagnosis present

## 2020-10-16 DIAGNOSIS — I21A1 Myocardial infarction type 2: Secondary | ICD-10-CM | POA: Diagnosis present

## 2020-10-16 DIAGNOSIS — N185 Chronic kidney disease, stage 5: Secondary | ICD-10-CM | POA: Diagnosis present

## 2020-10-16 DIAGNOSIS — F419 Anxiety disorder, unspecified: Secondary | ICD-10-CM | POA: Diagnosis present

## 2020-10-16 DIAGNOSIS — N186 End stage renal disease: Secondary | ICD-10-CM | POA: Diagnosis present

## 2020-10-16 DIAGNOSIS — Z20822 Contact with and (suspected) exposure to covid-19: Secondary | ICD-10-CM | POA: Diagnosis present

## 2020-10-16 DIAGNOSIS — Z905 Acquired absence of kidney: Secondary | ICD-10-CM

## 2020-10-16 DIAGNOSIS — N2581 Secondary hyperparathyroidism of renal origin: Secondary | ICD-10-CM | POA: Diagnosis present

## 2020-10-16 DIAGNOSIS — D631 Anemia in chronic kidney disease: Secondary | ICD-10-CM | POA: Diagnosis present

## 2020-10-16 DIAGNOSIS — Z6841 Body Mass Index (BMI) 40.0 and over, adult: Secondary | ICD-10-CM

## 2020-10-16 DIAGNOSIS — I169 Hypertensive crisis, unspecified: Secondary | ICD-10-CM | POA: Diagnosis present

## 2020-10-16 DIAGNOSIS — Z7984 Long term (current) use of oral hypoglycemic drugs: Secondary | ICD-10-CM

## 2020-10-16 DIAGNOSIS — E1122 Type 2 diabetes mellitus with diabetic chronic kidney disease: Secondary | ICD-10-CM | POA: Diagnosis present

## 2020-10-16 DIAGNOSIS — I509 Heart failure, unspecified: Secondary | ICD-10-CM | POA: Diagnosis present

## 2020-10-16 DIAGNOSIS — I120 Hypertensive chronic kidney disease with stage 5 chronic kidney disease or end stage renal disease: Principal | ICD-10-CM | POA: Diagnosis present

## 2020-10-16 DIAGNOSIS — Z79899 Other long term (current) drug therapy: Secondary | ICD-10-CM

## 2020-10-16 DIAGNOSIS — E785 Hyperlipidemia, unspecified: Secondary | ICD-10-CM | POA: Diagnosis present

## 2020-10-16 DIAGNOSIS — T82858A Stenosis of vascular prosthetic devices, implants and grafts, initial encounter: Secondary | ICD-10-CM | POA: Diagnosis present

## 2020-10-16 DIAGNOSIS — I10 Essential (primary) hypertension: Secondary | ICD-10-CM | POA: Diagnosis present

## 2020-10-16 DIAGNOSIS — Y841 Kidney dialysis as the cause of abnormal reaction of the patient, or of later complication, without mention of misadventure at the time of the procedure: Secondary | ICD-10-CM | POA: Diagnosis present

## 2020-10-16 LAB — CBC WITH DIFF
BASOPHIL #: <0.1 10*3/uL (ref <=0.20)
BASOPHIL %: 0 %
EOSINOPHIL #: <0.1 10*3/uL (ref <=0.50)
EOSINOPHIL %: 1 %
HCT: 31.9 % — ABNORMAL LOW (ref 34.8–46.0)
HGB: 10.1 g/dL — ABNORMAL LOW (ref 11.5–16.0)
IMMATURE GRANULOCYTE #: <0.1 10*3/uL (ref <0.10)
IMMATURE GRANULOCYTE %: 0 % (ref 0–1)
LYMPHOCYTE #: 1.19 10*3/uL (ref 1.00–4.80)
LYMPHOCYTE %: 17 %
MCH: 29.4 pg (ref 26.0–32.0)
MCHC: 31.7 g/dL (ref 31.0–35.5)
MCV: 93 fL (ref 78.0–100.0)
MONOCYTE #: 0.39 10*3/uL (ref 0.20–1.10)
MONOCYTE %: 6 %
MPV: 10.7 fL (ref 8.7–12.5)
NEUTROPHIL #: 5.28 10*3/uL (ref 1.50–7.70)
NEUTROPHIL %: 76 %
PLATELETS: 245 10*3/uL (ref 150–400)
RBC: 3.43 10*6/uL — ABNORMAL LOW (ref 3.85–5.22)
RDW-CV: 13.1 % (ref 11.5–15.5)
WBC: 7 10*3/uL (ref 3.7–11.0)

## 2020-10-16 LAB — URINALYSIS WITH MICROSCOPIC REFLEX IF INDICATED BMC/JMC ONLY
BILIRUBIN: NEGATIVE mg/dL
GLUCOSE: 250 mg/dL — AB
KETONES: NEGATIVE mg/dL
NITRITE: NEGATIVE
PH: 6 (ref <8.0)
PROTEIN: >=300 mg/dL — AB
SPECIFIC GRAVITY: 1.025 — ABNORMAL HIGH (ref <1.022)
UROBILINOGEN: 0.2 mg/dL (ref <=2.0)

## 2020-10-16 LAB — COMPREHENSIVE METABOLIC PANEL, NON-FASTING
ALBUMIN: 2.9 g/dL — ABNORMAL LOW (ref 3.5–5.0)
ALKALINE PHOSPHATASE: 92 U/L (ref 50–130)
ALT (SGPT): 20 U/L (ref 8–22)
ANION GAP: 12 mmol/L (ref 4–13)
AST (SGOT): 14 U/L (ref 8–45)
BILIRUBIN TOTAL: 0.3 mg/dL (ref 0.3–1.3)
BUN/CREA RATIO: 10 (ref 6–22)
BUN: 51 mg/dL — ABNORMAL HIGH (ref 8–25)
CALCIUM: 8.3 mg/dL — ABNORMAL LOW (ref 8.5–10.0)
CHLORIDE: 106 mmol/L (ref 96–111)
CO2 TOTAL: 19 mmol/L — ABNORMAL LOW (ref 22–30)
CREATININE: 5.07 mg/dL — ABNORMAL HIGH (ref 0.60–1.05)
ESTIMATED GFR: 9 mL/min/BSA — ABNORMAL LOW (ref >=60)
GLUCOSE: 226 mg/dL — ABNORMAL HIGH (ref 65–125)
POTASSIUM: 4.5 mmol/L (ref 3.5–5.1)
PROTEIN TOTAL: 6.2 g/dL — ABNORMAL LOW (ref 6.4–8.3)
SODIUM: 137 mmol/L (ref 136–145)

## 2020-10-16 LAB — PROCALCITONIN: PROCALCITONIN: 0.08 ng/mL (ref <0.50)

## 2020-10-16 LAB — URINALYSIS, MICROSCOPIC

## 2020-10-16 LAB — COVID-19, FLU A/B, RSV RAPID BY PCR
INFLUENZA VIRUS TYPE A: NOT DETECTED
INFLUENZA VIRUS TYPE B: NOT DETECTED
RESPIRATORY SYNCTIAL VIRUS (RSV): NOT DETECTED
SARS-CoV-2: NOT DETECTED

## 2020-10-16 LAB — B-TYPE NATRIURETIC PEPTIDE: BNP: 356 pg/mL — ABNORMAL HIGH (ref <=99)

## 2020-10-16 LAB — TROPONIN-I: TROPONIN I: 122 ng/L (ref 7–30)

## 2020-10-16 MED ORDER — ASPIRIN 81 MG CHEWABLE TABLET
324.0000 mg | CHEWABLE_TABLET | ORAL | Status: AC
Start: 2020-10-16 — End: 2020-10-16
  Administered 2020-10-16 (×2): 324 mg via ORAL
  Filled 2020-10-16: qty 4

## 2020-10-16 MED ORDER — FUROSEMIDE 10 MG/ML INJECTION SOLUTION
60.0000 mg | Freq: Once | INTRAMUSCULAR | Status: AC
Start: 2020-10-16 — End: 2020-10-16
  Administered 2020-10-16: 60 mg via INTRAVENOUS
  Filled 2020-10-16: qty 8

## 2020-10-16 NOTE — H&P (Signed)
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 She has a dry nonproductive Cough and some traceextremity swelling but she Denies any fevers, congestion, sore throat, nausea, vomiting diarrhea loss of appetite taste or smell. She is followed by nephrology for her stage V CKD, not yet on dialysis but had fistula created in November as they knew the need for dialysis was impending.   A CXR today shows what appears to be evidence of pulmonary congestion, small right pleural effusion. Lab workup significant for a BNP 356, was normal just a few months ago  And an elevated Trop(122). Baseline renal dysfunction. Wbc and procal normal. COVID /Flu swab negative today her Blood pressure is significantly elevated as high as 200's/100s, Stable on RA. EKG shows no clear evidence of acute ischemic changes, consistent with some LVH changes. She was admitted to the hospital for further treatment and monitoring of her fluid overload      Patient Active Problem List    Diagnosis Date Noted   . Morbid obesity 10/24/2017   . CKD (chronic kidney disease), stage V (CMS HCC) 08/03/2020   . Persistent vomiting 08/20/2019   . 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 Hyperkalemia 10/08/2017   . AKI (acute kidney injury) (CMS [place]) 10/08/2017   . Slow transit constipation 10/08/2017   . C. difficile diarrhea  12/02/2014   . Essential hypertension 11/30/2014   . Obesity 11/30/2014   . Cellulitis of foot, left 11/29/2014   . Type 2 diabetes mellitus (CMS [place]) 10/25/2014   . Diabetic foot infection (CMS [place]) 10/24/2014       Past Medical History:   Diagnosis Date   . Anxiety    . Arthritis    . Cancer (CMS [place]) 2019, 2021    kidney   . Cellulitis 10/24/14    left foot   . 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 Depression    . Diabetes mellitus (CMS [place])    . Diabetes mellitus, type 2 (CMS HCC)    . Essential hypertension    . Fatty liver    . Headache(784.0)    . History of nephrectomy 03/24/2020    L nephrectomy   . Hyperlipidemia    . Hyperthyroidism    . Irritable bowel syndrome    . Macular edema    . Morbid obesity (CMS [place])    . Peripheral neuropathy     bilateral feet   . Type 2 diabetes mellitus (CMS HCC)    . Wears glasses    . White coat hypertension            Past Surgical History:   Procedure Laterality Date   . ECTOPIC PREGNANCY SURGERY  05/24/1998   . HX CATARACT REMOVAL  Bilateral 2016   . HX CHOLECYSTECTOMY  09/10/96   . HX PELVIC LAPAROSCOPY  05/24/98    Ectopic pregnancy   . HX TUBAL LIGATION  09/10/2012    [place] Clips   . HYMENECTOMY  1996   . KIDNEY SURGERY  2019, 2021   . LAPAROSCOPIC PARTIAL NEPHRECTOMY Left 10/24/2017    hand-assisted   . RENAL BIOPSY, PERCUTANEOUS  03/08/2020    clear cell renal cell carcinoma            Medications Prior to Admission     Prescriptions    albuterol sulfate (PROVENTIL OR VENTOLIN OR PROAIR) 90 mcg/actuation Inhalation HFA Aerosol Inhaler    Take 2 Puffs by inhalation Every 4 hours as needed    ALPRAZolam (XANAX) 0.25 mg Oral Tablet    Take 0.25 mg by mouth    amLODIPine (NORVASC) 2.5 mg Oral Tablet    Take 2.5 mg by mouth    atorvastatin (LIPITOR) 20 mg Oral Tablet    Take 20 mg by mouth Every evening    escitalopram oxalate (LEXAPRO) 10 mg Oral Tablet    Take 10 mg by mouth Every night    Fish Oil-Omega-3 Fatty Acids 360-1,200 mg Oral Capsule     Take 2 Caps by mouth Twice daily    fluticasone (FLONASE) 50 mcg/actuation Nasal Spray, Suspension    1 Spray by Each Nostril route Once a day    furosemide (LASIX) 40 mg Oral Tablet    JANUVIA 50 mg Oral Tablet    loperamide (IMODIUM) 2 mg Oral Capsule    Take 2 mg by mouth Every 4 hours as needed    loratadine (CLARITIN) 10 mg Oral Tablet    Take 10 mg by mouth Once per day as needed     losartan (COZAAR) 25 mg Oral Tablet    Take 1 Tablet (25 mg total) by mouth Once a day for 30 days        aspirin chewable tablet 324 mg, 324 mg, Oral, Now  furosemide (LASIX) 10 mg/mL injection, 60 mg, Intravenous, Once        Allergies   Allergen Reactions   . Vicodin [Hydrocodone-Acetaminophen] Itching       Social History     Tobacco Use   . Smoking status: Never Smoker   . Smokeless tobacco: Never Used   Substance Use Topics   . Alcohol use: No     Reviewed on admission   Family Medical History:     Problem Relation (Age of Onset)    Breast Cancer Paternal Aunt    Cancer Maternal Grandmother    Diabetes Father, Paternal Aunt    High Cholesterol Maternal Uncle, Mother    Hypertension (High Blood Pressure) Maternal Uncle, Father            ROS: Other than ROS in the HPI, all other systems were negative.    EXAM:  Temperature: 36.3 C (97.3 F)  Heart Rate: (!) 120  BP (Non-Invasive): (!) 206/101  Respiratory Rate: (!) 22  SpO2: 98 %  General: no acute distress, resting comfortably    Eyes: Pupils equal and round, reactive to light. Anicteric  HEENT: Head atraumatic and normocephalic, MMM  Neck: No JVD or thyromegaly or lymphadenopathy   Lungs: No respiratory distress. Lungs CTAB mostly but slightly diminished . No w/r/r.   Cardiovascular: normal rate, regular rhythm, S1, S2 normal, no murmur appreciated  Abdomen: Soft, non-tender, non-distended, bowel sounds normal  Extremities: extremities normal, atraumatic,  no cyanosis. Trace  Ankle edema. DP pulses 2+ and equal bilaterally.   Skin: Skin warm and dry   Neurologic: Alert,  oriented, no focal deficit, CN II-XII grossly intact  Lymphatics: No lymphadenopathy   Psychiatric: Normal affect, behavior       Labs:    I have reviewed all lab results.  Lab Results for Last 24 Hours:    Results for orders placed or performed during the hospital encounter of 10/16/20 (from the past 24 hour(s))   COMPREHENSIVE METABOLIC PANEL, NON-FASTING   Result Value Ref Range    SODIUM 137 136 - 145 mmol/L    POTASSIUM 4.5 3.5 - 5.1 mmol/L    CHLORIDE 106 96 - 111 mmol/L    CO2 TOTAL 19 (L) 22 - 30 mmol/L    ANION GAP 12 4 - 13 mmol/L    BUN 51 (H) 8 - 25 mg/dL    CREATININE 5.07 (H) 0.60 - 1.05 mg/dL    BUN/CREA RATIO 10 6 - 22    ESTIMATED GFR 9 (L) >=60 mL/min/BSA    ALBUMIN 2.9 (L) 3.5 - 5.0 g/dL     CALCIUM 8.3 (L) 8.5 - 10.0 mg/dL    GLUCOSE 226 (H) 65 - 125 mg/dL    ALKALINE PHOSPHATASE 92 50 - 130 U/L    ALT (SGPT) 20 8 - 22 U/L    AST (SGOT)  14 8 - 45 U/L    BILIRUBIN TOTAL 0.3 0.3 - 1.3 mg/dL    PROTEIN TOTAL 6.2 (L) 6.4 - 8.3 g/dL   COVID-19, FLU A/B, RSV RAPID BY PCR   Result Value Ref Range    SARS-CoV-2 Not Detected Not Detected    INFLUENZA VIRUS TYPE A Not Detected Not Detected    INFLUENZA VIRUS TYPE B Not Detected Not Detected    RESPIRATORY SYNCTIAL VIRUS (RSV) Not Detected Not Detected   TROPONIN-I   Result Value Ref Range    TROPONIN I 122 (HH) 7 - 30 ng/L   B-TYPE NATRIURETIC PEPTIDE   Result Value Ref Range    BNP 356 (H) <=99 pg/mL   CBC WITH DIFF   Result Value Ref Range    WBC 7.0 3.7 - 11.0 x10^3/uL    RBC 3.43 (L) 3.85 - 5.22 x10^6/uL    HGB 10.1 (L) 11.5 - 16.0 g/dL    HCT 31.9 (L) 34.8 - 46.0 %    MCV 93.0 78.0 - 100.0 fL    MCH 29.4 26.0 - 32.0 pg    MCHC 31.7 31.0 - 35.5 g/dL    RDW-CV 13.1 11.5 - 15.5 %    PLATELETS 245 150 - 400 x10^3/uL    MPV 10.7 8.7 - 12.5 fL    NEUTROPHIL % 76 %    LYMPHOCYTE % 17 %    MONOCYTE % 6 %    EOSINOPHIL % 1 %    BASOPHIL % 0 %    NEUTROPHIL # 5.28 1.50 - 7.70 x10^3/uL    LYMPHOCYTE # 1.19 1.00 - 4.80 x10^3/uL    MONOCYTE # 0.39 0.20 - 1.10  x10^3/uL    EOSINOPHIL # <0.10 <=0.50 x10^3/uL    BASOPHIL # <0.10 <=0.20 x10^3/uL    IMMATURE GRANULOCYTE % 0 0 - 1 %    IMMATURE GRANULOCYTE # <0.10 <0.10 x10^3/uL       Imaging Studies:      PROCEDURE DESCRIPTION: XR CHEST PA AND LATERAL    CLINICAL INDICATION: sob    TECHNIQUE: 2 views /  images submitted.    COMPARISON: 03/27/2020      FINDINGS: The heart is normal in size. There are minimal patchy bilateral lung densities that are new from 03/27/2020. There is also slight blunting of the right costophrenic angle that is new from the prior study.    IMPRESSION:  Suspect patchy bilateral pneumonia new from 03/27/2020. Small right effusion. Follow-up PA and lateral chest x-ray in a few weeks after acute illness recommended to document complete resolution.      Assessment/Plan:   [name] is a 55 y.o., White female who presents with the following:    1. Hypertensive crisis, and    2. Fluid overload, due to:   3. Stage V CKD,  not yet on dialysis   --due to imminent need to start dialysis which patient is aware of. Dr. Jerrye Beavers placed LUA AVF on 08/11/20, + bruit    --[facility] office, they are consulted to be on board with patients care   --will treat pt with IV lasix, 60 mg now and then 40 mg IV BID for now. Will monitor output and adjust as indicated. Says she just started taking lasix the beginning of the month 40 mg daily   --BP elevated as high as 735'H systolic. Will start patient on nitro drip   --continue home amlodipine for now. Just had losartan d/ced due to recurrent hyperkalemia.   --avoid nephrotoxins and renally dose medications as indicated     4. Type II NSTEMI   --she does admit to some chest pressure that is worse with exertion today but strongly suspect this is due to ischemic demand   --EKG shows no acute ischemic changes but if trops continue to increase recheck  --continue to trend trops  --checking echo     5. hx of clear cell carcinoma of the kidney  -- Followed by oncology team  at [facility] in [place]. s/p left nephrectomy in 03/2020. No additional treatment was recommended for her cancer at that time per tumor board consultation. She states she's not having any pain and has healed well.      6. Uncontrolled type II Dm  --most recent A1C 8.1 improving from previous. BG 200's today  --Continue DPP4I. Januvia nonformulary here, trajenta ordered   --add ssi coverage with Humalog    7. hyperparathyroidism due to renal insufficiency  --recent levels noted, continue calcitriol       DVT PPx: subq heparin   Code Status:  Full Code    [name] Grills, DNP,FNP-C       Portions of this note may be dictated using voice recognition software or a dictation service. Variances in spelling and vocabulary are possible and unintentional. Not all errors are caught/corrected. Please notify the [facility] if any discrepancies are noted or if the meaning of any statement is not clear.

## 2020-10-16 NOTE — ED Triage Notes (Signed)
Patient has c/o SOB, chest tightness. Denies fever. Patient rates pain 9/10.

## 2020-10-16 NOTE — ED Nurses Note (Signed)
EKG completed in triage.

## 2020-10-16 NOTE — ED Provider Notes (Signed)
[name] Jermon Chalfant, DO  [facility]  Emergency Department Visit Note      Chief Complaint:  Shortness of breath    HISTORY OF PRESENT ILLNESS     [name], date of birth 04/21/1966, is a 55 y.o.female who presents to the Emergency Department with shortness of breath.  Patient presents emerged department feeling short of breath since last night.  She has a history of chronic renal insufficiency and COVID-19 in November 2020. She says she feels like this is COVID-19 again.  She has been vaccinated approximately 1 year ago.  She has not been boosted.  She says he has mild shortness of breath cough congestion.  She has some mild chest tightness as well.  Denies any fevers or chills nausea vomiting diarrhea loss of appetite taste or smell.      REVIEW OF SYSTEMS     The pertinent positive and negative symptoms are as per HPI. All other systems reviewed and are negative.     PATIENT HISTORY     Past Medical History:  Past Medical History:   Diagnosis Date   . Anxiety    . Arthritis    . Cancer (CMS [place]) 2019, 2021    kidney   . Cellulitis 10/24/14    left foot   . 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 Depression    . Diabetes mellitus (CMS [place])    . Diabetes mellitus, type 2 (CMS HCC)    . Essential hypertension    . Fatty liver    . Headache(784.0)    . History of nephrectomy 03/24/2020    L nephrectomy   . Hyperlipidemia    . Hyperthyroidism    . Irritable bowel syndrome    . Macular edema    . Morbid obesity (CMS [place])    . Peripheral neuropathy     bilateral feet   . Type 2 diabetes mellitus (CMS HCC)    . Wears glasses    . White coat hypertension        Past Surgical History:  Past Surgical History:   Procedure Laterality Date   . Ectopic pregnancy surgery  05/24/1998   . Hx cataract removal Bilateral 2016   . Hx cholecystectomy  09/10/96   . Hx pelvic laparoscopy  05/24/98   . Hx tubal ligation  09/10/2012   . Hymenectomy  1996   . Kidney surgery  2019, 2021   . Laparoscopic partial  nephrectomy Left 10/24/2017   . Renal biopsy, percutaneous  03/08/2020       Family History:  Family Medical History:     Problem Relation (Age of Onset)    Breast Cancer Paternal Aunt    Cancer Maternal Grandmother    Diabetes Father, Paternal Aunt    High Cholesterol Maternal Uncle, Mother    Hypertension (High Blood Pressure) Maternal Uncle, Father            Social History:  Social History     Tobacco Use   . Smoking status: Never Smoker   . Smokeless tobacco: Never Used   Vaping Use   . Vaping Use: Never used   Substance Use Topics   . Alcohol use: No   . Drug use: No         Medications:  Current Outpatient Medications   Medication Sig   . albuterol sulfate (PROVENTIL OR VENTOLIN OR PROAIR) 90 mcg/actuation Inhalation HFA Aerosol Inhaler Take 2 Puffs by  inhalation Every 4 hours as needed   . ALPRAZolam (XANAX) 0.25 mg Oral Tablet Take 0.25 mg by mouth   . amLODIPine (NORVASC) 2.5 mg Oral Tablet Take 2.5 mg by mouth   . atorvastatin (LIPITOR) 20 mg Oral Tablet Take 20 mg by mouth Every evening   . escitalopram oxalate (LEXAPRO) 10 mg Oral Tablet Take 10 mg by mouth Every night   . Fish Oil-Omega-3 Fatty Acids 360-1,200 mg Oral Capsule Take 2 Caps by mouth Twice daily   . fluticasone (FLONASE) 50 mcg/actuation Nasal Spray, Suspension 1 Spray by Each Nostril route Once a day   . furosemide (LASIX) 40 mg Oral Tablet    . JANUVIA 50 mg Oral Tablet    . loperamide (IMODIUM) 2 mg Oral Capsule Take 2 mg by mouth Every 4 hours as needed   . loratadine (CLARITIN) 10 mg Oral Tablet Take 10 mg by mouth Once per day as needed    . losartan (COZAAR) 25 mg Oral Tablet Take 1 Tablet (25 mg total) by mouth Once a day for 30 days       Allergies:  Allergies   Allergen Reactions   . Vicodin [Hydrocodone-Acetaminophen] Itching       PHYSICAL EXAM     Vitals:  ED Triage Vitals [10/16/20 1821]   BP (Non-Invasive) (!) 206/101   Heart Rate (!) 120   Respiratory Rate (!) 22   Temperature 36.3 C (97.3 F)   SpO2 98 %   Weight 127 kg  (280 lb)   Height 1.702 m (5\' 7" )     Constitutional: The patient is alert, oriented, non toxic  Head: Atraumatic, normocephalic  Eyes: Pupils equal and round, reactive to light and accomodation. Extraocular muscles intact bilaterally, conjunctiva and sclera clear  Neck: Soft, supple, no palpatory masses, lymphadenopathy, carotid bruits, tenderness or JVD.  Chest: Atraumatic, no tenderness to palpation, clear and equal breath sounds bilaterally. No wheezes, rhonchi or rales.  No accessory muscle use.  Cardiovascular: Heart is S1-S2, tachycardic rate and rhythm without murmur, click, gallop or rub.  Abdomen: Soft, non-tender, non-distended without evidence of rebound or guarding, no gross organomegaly or other palpatory masses.   Skin: No cyanosis, jaundice, rash or lesion or edema.  Neurologic: Alert, oriented, normal facial symmetry and speech, Normal upper and lower extremity strength and grossly normal sensation.   Musculoskeletal: No deformities, trauma, edema.  Vascular: Normal peripheral pulses 4/4 with brisk capillary refills of less than 2 seconds.     DIAGNOSTIC STUDIES     Labs:    Results for orders placed or performed during the hospital encounter of 10/16/20   COMPREHENSIVE METABOLIC PANEL, NON-FASTING   Result Value Ref Range    SODIUM 137 136 - 145 mmol/L    POTASSIUM 4.5 3.5 - 5.1 mmol/L    CHLORIDE 106 96 - 111 mmol/L    CO2 TOTAL 19 (L) 22 - 30 mmol/L    ANION GAP 12 4 - 13 mmol/L    BUN 51 (H) 8 - 25 mg/dL    CREATININE 5.07 (H) 0.60 - 1.05 mg/dL    BUN/CREA RATIO 10 6 - 22    ESTIMATED GFR 9 (L) >=60 mL/min/BSA    ALBUMIN 2.9 (L) 3.5 - 5.0 g/dL     CALCIUM 8.3 (L) 8.5 - 10.0 mg/dL    GLUCOSE 226 (H) 65 - 125 mg/dL    ALKALINE PHOSPHATASE 92 50 - 130 U/L    ALT (SGPT) 20 8 - 22 U/L  AST (SGOT)  14 8 - 45 U/L    BILIRUBIN TOTAL 0.3 0.3 - 1.3 mg/dL    PROTEIN TOTAL 6.2 (L) 6.4 - 8.3 g/dL   COVID-19, FLU A/B, RSV RAPID BY PCR   Result Value Ref Range    SARS-CoV-2 Not Detected Not Detected     INFLUENZA VIRUS TYPE A Not Detected Not Detected    INFLUENZA VIRUS TYPE B Not Detected Not Detected    RESPIRATORY SYNCTIAL VIRUS (RSV) Not Detected Not Detected   TROPONIN-I   Result Value Ref Range    TROPONIN I 122 (HH) 7 - 30 ng/L   B-TYPE NATRIURETIC PEPTIDE   Result Value Ref Range    BNP 356 (H) <=99 pg/mL   CBC WITH DIFF   Result Value Ref Range    WBC 7.0 3.7 - 11.0 x10^3/uL    RBC 3.43 (L) 3.85 - 5.22 x10^6/uL    HGB 10.1 (L) 11.5 - 16.0 g/dL    HCT 31.9 (L) 34.8 - 46.0 %    MCV 93.0 78.0 - 100.0 fL    MCH 29.4 26.0 - 32.0 pg    MCHC 31.7 31.0 - 35.5 g/dL    RDW-CV 13.1 11.5 - 15.5 %    PLATELETS 245 150 - 400 x10^3/uL    MPV 10.7 8.7 - 12.5 fL    NEUTROPHIL % 76 %    LYMPHOCYTE % 17 %    MONOCYTE % 6 %    EOSINOPHIL % 1 %    BASOPHIL % 0 %    NEUTROPHIL # 5.28 1.50 - 7.70 x10^3/uL    LYMPHOCYTE # 1.19 1.00 - 4.80 x10^3/uL    MONOCYTE # 0.39 0.20 - 1.10 x10^3/uL    EOSINOPHIL # <0.10 <=0.50 x10^3/uL    BASOPHIL # <0.10 <=0.20 x10^3/uL    IMMATURE GRANULOCYTE % 0 0 - 1 %    IMMATURE GRANULOCYTE # <0.10 <0.10 x10^3/uL     Labs reviewed and interpreted by me.    Radiology:    XR CHEST PA AND LATERAL   Final Result   Suspect patchy bilateral pneumonia new from 03/27/2020. Small right effusion. Follow-up PA and lateral chest x-ray in a few weeks after acute illness recommended to document complete resolution.                        Radiologist location ID: [id number]           Radiological imaging interpreted by radiologist. Report reviewed by me.    EKG:  12 lead EKG interpreted by me shows sinus tachycardic rhythm, rate of 106 bpm, normal intervals, normal axis deviation, good R wave progression, non-specific ST-T wave changes.    ED PROGRESS NOTE / [facility] records reviewed by me:  I have reviewed the nurse's notes. I have reviewed the patient's problem list and pertinent past medical records.    Orders Placed This Encounter   . XR CHEST PA AND LATERAL   . CBC/DIFF   . COMPREHENSIVE  METABOLIC PANEL, NON-FASTING   . URINALYSIS WITH MICROSCOPIC REFLEX IF INDICATED [facility] ONLY   . COVID-19, FLU A/B, RSV RAPID BY PCR   . TROPONIN-I   . B-TYPE NATRIURETIC PEPTIDE   . CBC WITH DIFF   . PROCALCITONIN   . CANCELED: ECG 12-LEAD   . ECG 12-Lead   . aspirin chewable tablet 324 mg   . furosemide (LASIX) 10 mg/mL injection  ED Course as of 10/16/20 2015   Sat Oct 16, 2020   1820 I initially evaluated the patient. Plan for labs, EKG and XR [DD]   1959 Patient's elevated troponin mildly elevated BNP.  Her chest x-ray shows bilateral infiltrates concerning for pneumonia however due to the patient's complaint at this time I think this very well could be more volume overload instead.  Plan will be to consult the hospitalist for admission [DD]   2015 Patient admitted to Dr. Delora Fuel [DD]      ED Course User Index  [DD] Cyla Haluska, [name] Richer, DO        Pre-Disposition Vitals:  Filed Vitals:    10/16/20 1821   BP: (!) 206/101   Pulse: (!) 120   Resp: (!) 22   Temp: 36.3 C (97.3 F)   SpO2: 98%       CLINICAL IMPRESSION     1. Volume overload  2. Elevated Troponin, likely demand ischemia  3. Hypertensive crisis    DISPOSITION/PLAN     Admitted    Condition at Disposition: Fair

## 2020-10-17 LAB — CBC WITH DIFF
BASOPHIL #: <0.1 10*3/uL (ref <=0.20)
BASOPHIL %: 0 %
EOSINOPHIL #: 0.1 10*3/uL (ref <=0.50)
EOSINOPHIL %: 1 %
HCT: 29.6 % — ABNORMAL LOW (ref 34.8–46.0)
HGB: 9.2 g/dL — ABNORMAL LOW (ref 11.5–16.0)
IMMATURE GRANULOCYTE #: <0.1 10*3/uL (ref <0.10)
IMMATURE GRANULOCYTE %: 0 % (ref 0–1)
LYMPHOCYTE #: 1.63 10*3/uL (ref 1.00–4.80)
LYMPHOCYTE %: 22 %
MCH: 28.7 pg (ref 26.0–32.0)
MCHC: 31.1 g/dL (ref 31.0–35.5)
MCV: 92.2 fL (ref 78.0–100.0)
MONOCYTE #: 0.48 10*3/uL (ref 0.20–1.10)
MONOCYTE %: 7 %
MPV: 11.5 fL (ref 8.7–12.5)
NEUTROPHIL #: 5.11 10*3/uL (ref 1.50–7.70)
NEUTROPHIL %: 70 %
PLATELETS: 238 10*3/uL (ref 150–400)
RBC: 3.21 10*6/uL — ABNORMAL LOW (ref 3.85–5.22)
RDW-CV: 13.1 % (ref 11.5–15.5)
WBC: 7.4 10*3/uL (ref 3.7–11.0)

## 2020-10-17 LAB — COMPREHENSIVE METABOLIC PANEL, NON-FASTING
ALBUMIN: 2.6 g/dL — ABNORMAL LOW (ref 3.5–5.0)
ALKALINE PHOSPHATASE: 74 U/L (ref 50–130)
ALT (SGPT): 16 U/L (ref 8–22)
ANION GAP: 10 mmol/L (ref 4–13)
AST (SGOT): 12 U/L (ref 8–45)
BILIRUBIN TOTAL: 0.3 mg/dL (ref 0.3–1.3)
BUN/CREA RATIO: 10 (ref 6–22)
BUN: 50 mg/dL — ABNORMAL HIGH (ref 8–25)
CALCIUM: 8.6 mg/dL (ref 8.5–10.0)
CHLORIDE: 108 mmol/L (ref 96–111)
CO2 TOTAL: 18 mmol/L — ABNORMAL LOW (ref 22–30)
CREATININE: 5.08 mg/dL — ABNORMAL HIGH (ref 0.60–1.05)
ESTIMATED GFR: 9 mL/min/BSA — ABNORMAL LOW (ref >=60)
GLUCOSE: 120 mg/dL (ref 65–125)
POTASSIUM: 4.6 mmol/L (ref 3.5–5.1)
PROTEIN TOTAL: 5.5 g/dL — ABNORMAL LOW (ref 6.4–8.3)
SODIUM: 136 mmol/L (ref 136–145)

## 2020-10-17 LAB — PHOSPHORUS: PHOSPHORUS: 5.9 mg/dL — ABNORMAL HIGH (ref 2.4–4.7)

## 2020-10-17 LAB — POC FINGERSTICK GLUCOSE - BMC/JMC (RESULTS)
GLUCOSE, POC: 123 mg/dl — ABNORMAL HIGH (ref 60–100)
GLUCOSE, POC: 161 mg/dl — ABNORMAL HIGH (ref 60–100)
GLUCOSE, POC: 160 mg/dl — ABNORMAL HIGH (ref 60–100)
GLUCOSE, POC: 179 mg/dl — ABNORMAL HIGH (ref 60–100)

## 2020-10-17 LAB — TROPONIN-I
TROPONIN I: 224 ng/L (ref 7–30)
TROPONIN I: 248 ng/L (ref 7–30)
TROPONIN I: 257 ng/L (ref 7–30)

## 2020-10-17 LAB — D-DIMER: D-DIMER: 540 ng/mL DDU — ABNORMAL HIGH (ref <=230)

## 2020-10-17 LAB — HEPATITIS B SURFACE ANTIBODY: HBV SURFACE ANTIBODY QUANTITATIVE: 1 m[IU]/mL (ref <8)

## 2020-10-17 LAB — MAGNESIUM: MAGNESIUM: 1.6 mg/dL — ABNORMAL LOW (ref 1.8–2.6)

## 2020-10-17 LAB — HEPATITIS C ANTIBODY SCREEN WITH REFLEX TO HCV PCR: HCV ANTIBODY QUALITATIVE: NEGATIVE

## 2020-10-17 LAB — HEPATITIS B SURFACE ANTIGEN: HBV SURFACE ANTIGEN QUALITATIVE: NEGATIVE

## 2020-10-17 MED ORDER — AMLODIPINE 5 MG TABLET
2.5000 mg | ORAL_TABLET | Freq: Every day | ORAL | Status: DC
Start: 2020-10-17 — End: 2020-10-17
  Administered 2020-10-17: 09:00:00 2.5 mg via ORAL

## 2020-10-17 MED ORDER — ACETAMINOPHEN 325 MG TABLET
650.0000 mg | ORAL_TABLET | Freq: Four times a day (QID) | ORAL | Status: DC | PRN
Start: 2020-10-17 — End: 2020-10-20
  Administered 2020-10-17 – 2020-10-18 (×3): 650 mg via ORAL
  Filled 2020-10-17 (×4): qty 2

## 2020-10-17 MED ORDER — ATORVASTATIN 20 MG TABLET
20.0000 mg | ORAL_TABLET | Freq: Every evening | ORAL | Status: DC
Start: 2020-10-17 — End: 2020-10-20
  Administered 2020-10-17 – 2020-10-19 (×4): 20 mg via ORAL
  Filled 2020-10-17 (×4): qty 1

## 2020-10-17 MED ORDER — LOSARTAN 25 MG TABLET
25.0000 mg | ORAL_TABLET | Freq: Every day | ORAL | Status: DC
Start: 2020-10-17 — End: 2020-10-19
  Administered 2020-10-17 – 2020-10-18 (×2): 25 mg via ORAL
  Filled 2020-10-17 (×2): qty 1

## 2020-10-17 MED ORDER — LINAGLIPTIN 5 MG TABLET
5.0000 mg | ORAL_TABLET | Freq: Every day | ORAL | Status: DC
Start: 2020-10-17 — End: 2020-10-20
  Administered 2020-10-17 – 2020-10-20 (×3): 0 mg via ORAL
  Filled 2020-10-17 (×2): qty 1

## 2020-10-17 MED ORDER — ONDANSETRON HCL (PF) 4 MG/2 ML INJECTION SOLUTION
4.0000 mg | Freq: Four times a day (QID) | INTRAMUSCULAR | Status: DC | PRN
Start: 2020-10-17 — End: 2020-10-20

## 2020-10-17 MED ORDER — ESCITALOPRAM 10 MG TABLET
10.0000 mg | ORAL_TABLET | Freq: Every evening | ORAL | Status: DC
Start: 2020-10-17 — End: 2020-10-20
  Administered 2020-10-17 – 2020-10-19 (×4): 10 mg via ORAL
  Filled 2020-10-17 (×4): qty 1

## 2020-10-17 MED ORDER — NITROGLYCERIN 50 MG/250 ML (200 MCG/ML) IN 5 % DEXTROSE INTRAVENOUS
5.0000 ug/min | INTRAVENOUS | Status: DC
Start: 2020-10-17 — End: 2020-10-17

## 2020-10-17 MED ORDER — ALBUTEROL SULFATE CONCENTRATE 2.5 MG/0.5 ML SOLUTION FOR NEBULIZATION
2.5000 mg | INHALATION_SOLUTION | RESPIRATORY_TRACT | Status: DC | PRN
Start: 2020-10-16 — End: 2020-10-20

## 2020-10-17 MED ORDER — NITROGLYCERIN 50 MG/250 ML (200 MCG/ML) IN 5 % DEXTROSE INTRAVENOUS
10.0000 ug/min | INTRAVENOUS | Status: DC
Start: 2020-10-17 — End: 2020-10-17
  Administered 2020-10-17: 10 ug/min via INTRAVENOUS

## 2020-10-17 MED ORDER — IPRATROPIUM BROMIDE 0.02 % SOLUTION FOR INHALATION
0.5000 mg | RESPIRATORY_TRACT | Status: DC | PRN
Start: 2020-10-16 — End: 2020-10-20

## 2020-10-17 MED ORDER — SODIUM CHLORIDE 0.9% FLUSH BAG - 250 ML
INTRAVENOUS | Status: DC | PRN
Start: 2020-10-17 — End: 2020-10-20

## 2020-10-17 MED ORDER — FUROSEMIDE 10 MG/ML INJECTION SOLUTION
40.0000 mg | Freq: Two times a day (BID) | INTRAMUSCULAR | Status: DC
Start: 2020-10-17 — End: 2020-10-17

## 2020-10-17 MED ORDER — DEXTROSE 50 % IN WATER (D50W) INTRAVENOUS SYRINGE
12.5000 g | INJECTION | INTRAVENOUS | Status: DC | PRN
Start: 2020-10-16 — End: 2020-10-20

## 2020-10-17 MED ORDER — HEPARIN (PORCINE) 5,000 UNIT/ML INJECTION SOLUTION
5000.0000 [IU] | Freq: Three times a day (TID) | INTRAMUSCULAR | Status: DC
Start: 2020-10-17 — End: 2020-10-20
  Administered 2020-10-17 (×3): 5000 [IU] via SUBCUTANEOUS
  Administered 2020-10-18: 22:00:00 0 [IU] via SUBCUTANEOUS
  Administered 2020-10-18: 5000 [IU] via SUBCUTANEOUS
  Administered 2020-10-18 – 2020-10-19 (×3): 0 [IU] via SUBCUTANEOUS
  Administered 2020-10-19 – 2020-10-20 (×2): 5000 [IU] via SUBCUTANEOUS
  Filled 2020-10-17 (×8): qty 1

## 2020-10-17 MED ORDER — NITROGLYCERIN 50 MG/250 ML (200 MCG/ML) IN 5 % DEXTROSE INTRAVENOUS
5.0000 ug/min | INTRAVENOUS | Status: DC
Start: 2020-10-17 — End: 2020-10-19
  Administered 2020-10-17: 12:00:00 0 ug/min via INTRAVENOUS
  Administered 2020-10-17: 5 ug/min via INTRAVENOUS

## 2020-10-17 MED ORDER — MAGNESIUM 64 MG (MAGNESIUM CHLORIDE) TABLET,DELAYED RELEASE
128.0000 mg | DELAYED_RELEASE_TABLET | Freq: Two times a day (BID) | ORAL | Status: DC
Start: 2020-10-17 — End: 2020-10-19
  Administered 2020-10-17 – 2020-10-18 (×5): 128 mg via ORAL
  Filled 2020-10-17 (×6): qty 2

## 2020-10-17 MED ORDER — AMLODIPINE 5 MG TABLET
10.0000 mg | ORAL_TABLET | Freq: Every day | ORAL | Status: DC
Start: 2020-10-17 — End: 2020-10-19
  Administered 2020-10-17: 10:00:00 10 mg via ORAL
  Filled 2020-10-17 (×3): qty 2

## 2020-10-17 MED ORDER — ALPRAZOLAM 0.5 MG TABLET
0.2500 mg | ORAL_TABLET | Freq: Every evening | ORAL | Status: DC | PRN
Start: 2020-10-17 — End: 2020-10-18

## 2020-10-17 MED ORDER — LABETALOL 100 MG TABLET
100.0000 mg | ORAL_TABLET | Freq: Three times a day (TID) | ORAL | Status: DC
Start: 2020-10-17 — End: 2020-10-19
  Administered 2020-10-17 – 2020-10-19 (×7): 100 mg via ORAL
  Filled 2020-10-17 (×7): qty 1

## 2020-10-17 MED ORDER — INSULIN LISPRO 100 UNIT/ML INJECTION SSIP - CITY
1.0000 [IU] | Freq: Four times a day (QID) | SUBCUTANEOUS | Status: DC
Start: 2020-10-17 — End: 2020-10-20
  Administered 2020-10-17 – 2020-10-20 (×14): 0 [IU] via SUBCUTANEOUS
  Filled 2020-10-17: qty 300

## 2020-10-17 MED ORDER — CALCITRIOL 0.25 MCG CAPSULE
0.2500 ug | ORAL_CAPSULE | Freq: Every day | ORAL | Status: DC
Start: 2020-10-17 — End: 2020-10-20
  Administered 2020-10-17 – 2020-10-19 (×3): 0.25 ug via ORAL
  Administered 2020-10-20: 0 ug via ORAL
  Filled 2020-10-17 (×3): qty 1

## 2020-10-17 MED ORDER — OMEGA 3-DHA-EPA-FISH OIL 300 MG-1,000 MG CAPSULE
1000.0000 mg | ORAL_CAPSULE | Freq: Every day | ORAL | Status: DC
Start: 2020-10-17 — End: 2020-10-20
  Administered 2020-10-17 – 2020-10-19 (×3): 1 via ORAL
  Administered 2020-10-20: 0 via ORAL
  Filled 2020-10-17 (×4): qty 1

## 2020-10-17 MED ORDER — SODIUM CHLORIDE 0.9 % (FLUSH) INJECTION SYRINGE
10.0000 mL | INJECTION | INTRAMUSCULAR | Status: DC | PRN
Start: 2020-10-17 — End: 2020-10-20

## 2020-10-17 MED ORDER — SODIUM CHLORIDE 0.9 % INTRAVENOUS SOLUTION
10.0000 mg/h | INTRAVENOUS | Status: DC
Start: 2020-10-17 — End: 2020-10-18
  Administered 2020-10-17 (×3): 10 mg/h via INTRAVENOUS
  Filled 2020-10-17 (×3): qty 10

## 2020-10-17 MED ORDER — SODIUM CHLORIDE 0.9 % (FLUSH) INJECTION SYRINGE
10.0000 mL | INJECTION | Freq: Three times a day (TID) | INTRAMUSCULAR | Status: DC
Start: 2020-10-17 — End: 2020-10-20
  Administered 2020-10-17: 0 mL via INTRAVENOUS
  Administered 2020-10-17: 10 mL via INTRAVENOUS
  Administered 2020-10-17 – 2020-10-18 (×3): 0 mL via INTRAVENOUS
  Administered 2020-10-18 – 2020-10-20 (×6): 10 mL via INTRAVENOUS

## 2020-10-17 MED ORDER — NITROGLYCERIN 50 MG/250 ML (200 MCG/ML) IN 5 % DEXTROSE INTRAVENOUS
5.0000 ug/min | INTRAVENOUS | Status: DC
Start: 2020-10-17 — End: 2020-10-17
  Administered 2020-10-17: 5 ug/min via INTRAVENOUS
  Filled 2020-10-17: qty 250

## 2020-10-17 NOTE — Nurses Notes (Signed)
Report given to [place], RN for room # 635. Placed on monitor for transport. Started Nitroglycerin @ 42mcg/min 1.5 rate IV. Transported by this nurse.  Pt walked to bed. BP placed with 30 minute intervals per doctor order. No distress noted at this time.

## 2020-10-17 NOTE — Progress Notes (Signed)
[facility]  Internal Medicine  IP Progress Note     Name: [name], [name] Date of Service:  10/17/2020   Date of Birth:  [birth year] Date of Admission:  10/16/2020   PCP: Ocie Doyne, NP Attending: Merrilee Jansky, MD   MRN: [id number] Code Status: Full Code     Subjective     Chief complaint: SOB    Interval history: Patient seen this afternoon, mentioned she was feeling tired because she hasn't slept all night. Patient looks better than imagined. No swelling noted in lower extremities.     Assessment and Plan     Summary: [name] is a 55 y.o. 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 Fluid overload   . CKD (chronic kidney disease), stage V (CMS HCC)   . Essential hypertension       Plan:    1. Fluid overload  Acute Congestive heart failure?  End stage kidney disease  - LUE AVF 11/17 with positive bruit  - will need eval regarding starting dialysis  - Nephrology on board  - IV lasix drip  - Losartan 25 mg daily  - NTG drip discontinued  - Monitor I/O  - Daily weight  - Telemetry  - IR for fistulogram to decide on HD    2. Type 2 NSTEMI  - Troponins are stable and expected to be elevated with kidney disease  - Check Echocardiogram    3. 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 Uncontrolled T2DM: A1C 8.1. Continue DPP. SSI  5. Hyperparathyroidism due CKD: c/w calcitriol    DVT Prophylaxis: Heparin  Code status: Full Code  Disposition: TBD     Physical Exam     BP (!) 148/80   Pulse 88   Temp 36.6 C (97.9 F)   Resp 18   Ht 1.7 m (5' 6.93")   Wt 130 kg (285 lb 12.8 oz)   LMP 10/24/2014 (Approximate)   SpO2 96%   BMI 44.86 kg/m         Physical Exam  Vitals and nursing note reviewed.   Constitutional:       Appearance: She is obese.   HENT:      Head:  Normocephalic and atraumatic.      Mouth/Throat:      Mouth: Mucous membranes are moist.   Eyes:      General: No scleral icterus.     Conjunctiva/sclera: Conjunctivae normal.   Cardiovascular:      Rate and Rhythm: Normal rate and regular rhythm.      Heart sounds: No murmur heard.  Pulmonary:      Effort: Pulmonary effort is normal. No respiratory distress.      Breath sounds: Normal breath sounds. No wheezing.   Abdominal:      General: Bowel sounds are normal. There is no distension.      Palpations: Abdomen is soft.      Tenderness: There is no abdominal tenderness.   Musculoskeletal:         General: No swelling.   Skin:     General: Skin is warm.      Capillary Refill: Capillary refill takes less than 2 seconds.      Coloration: Skin is not jaundiced.  Neurological:      Mental Status: She is alert and oriented to person, place, and time. Mental status is at baseline.   Psychiatric:         Mood and Affect: Mood normal.           Intake and Output     I/O last 24 hours:      Intake/Output Summary (Last 24 hours) at 10/17/2020 1420  Last data filed at 10/17/2020 0900  Gross per 24 hour   Intake 240 ml   Output -   Net 240 ml        Medications     acetaminophen (TYLENOL) tablet, 650 mg, Oral, Q6H PRN  albuterol (PROVENTIL) 2.47m/ 0.5 mL nebulizer solution, 2.5 mg, Nebulization, Q4H PRN   And  ipratropium (ATROVENT) 0.02% nebulizer solution, 0.5 mg, Nebulization, Q4H PRN  ALPRAZolam (XANAX) tablet, 0.25 mg, Oral, HS PRN  amLODIPine (NORVASC) tablet, 10 mg, Oral, Daily  atorvastatin (LIPITOR) tablet, 20 mg, Oral, QPM  calcitriol (ROCALTROL) capsule, 0.25 mcg, Oral, Daily  SSIP insulin lispro (HUMALOG) 100 units/mL SubQ pen, 1-5 Units, Subcutaneous, 4x/day AC   And  dextrose 50% (0.5 g/mL) injection - syringe, 12.5 g, Intravenous, Q15 Min PRN  escitalopram (LEXAPRO) tablet, 10 mg, Oral, NIGHTLY  furosemide (LASIX) 100 mg in NS 100 mL (tot vol) infusion, 10 mg/hr, Intravenous, Continuous  heparin 5,000 unit/mL  injection, 5,000 Units, Subcutaneous, Q8HRS  labetalol (NORMODYNE) tablet, 100 mg, Oral, Q8HRS  linagliptin (TRADJENTA) tablet, 5 mg, Oral, Daily  losartan (COZAAR) tablet, 25 mg, Oral, Daily  magnesium chloride (SLOW-MAG) delayed release tablet, 128 mg, Oral, 2x/day  nitroGLYCERIN 50 mg in 250 ml D5W premix infusion, 5 mcg/min, Intravenous, Continuous  NS 250 mL flush bag, , Intravenous, Q1H PRN  NS flush syringe, 10 mL, Intravenous, Q8HRS  NS flush syringe, 10 mL, Intravenous, Q1H PRN  omega3-DHA-EPA-fish oil capsule, 1,000 mg, Oral, Daily  ondansetron (ZOFRAN) 2 mg/mL injection, 4 mg, Intravenous, Q6H PRN         Labs       Recent Labs     10/16/20  1835 10/17/20  0743   HGB 10.1* 9.2*   WBC 7.0 7.4   PLTCNT 245 238       Recent Labs     10/16/20  1835 10/16/20  2113 10/17/20  0727   SODIUM 137  --  136   POTASSIUM 4.5  --  4.6   MAGNESIUM  --   --  1.6*   CALCIUM 8.3*  --  8.6   CREATININE 5.07*  --  5.08*   BUN 51*  --  50*   CO2 19*  --  18*   BILIRUBIN  --  Negative  --        HEMOGLOBIN A1C   Date Value Ref Range Status   10/25/2017 6.7 (H) <5.7 % Final     ERYTHROCYTE SEDIMENTATION RATE (ESR)   Date Value Ref Range Status   08/20/2019 55 (H) 0 - 30 mm/hr Final         Reviewed: I have reviewed all lab results.     Imaging studies     Results for orders placed or performed during the hospital encounter of 10/16/20 (from the past 24 hour(s))   XR CHEST PA AND LATERAL     Status: None    Narrative    PROCEDURE DESCRIPTION: XR CHEST PA AND LATERAL    CLINICAL INDICATION: sob    TECHNIQUE: 2 views /  images submitted.    COMPARISON: 03/27/2020      FINDINGS: The heart is normal in size. There are minimal patchy bilateral lung densities that are new from 03/27/2020. There is also slight blunting of the right costophrenic angle that is new from the prior study.      Impression    Suspect patchy bilateral pneumonia new from 03/27/2020. Small right effusion. Follow-up PA and lateral chest x-ray in a few weeks after acute  illness recommended to document complete resolution.                Radiologist location ID: [id number]         Reviewed: I have reviewed all lab results.     Microbiology     BLOOD CULTURE, ROUTINE   Date Value Ref Range Status   03/27/2020 No Growth 5 Days  Final   03/27/2020 No Growth 5 Days  Final            Medical Decision Making     All pertinent labs were personally reviewed with additional follow-up lab work ordered as deemed clinically appropriate.  All available imaging results and EKGs were reviewed and independently interpreted.  Pertinent previous medical record results were reviewed.  Further workup and treatment depending on clinical course     Provider: Merrilee Jansky, MD Date: 10/17/2020

## 2020-10-17 NOTE — ED Nurses Note (Signed)
Pt ambulated to the BR.

## 2020-10-17 NOTE — Nurses Notes (Signed)
Received patient to floor, nitro gtt infusing at 5 mcg/min lowest Blood pressure measured was in the 190's/90's. Message sent to [facility], she ordered nitro changed to 10 mcg/min. Will continue to monitor.

## 2020-10-17 NOTE — Care Plan (Signed)
Problem: Adult Inpatient Plan of Care  Goal: Plan of Care Review  Outcome: Ongoing (see interventions/notes)  Goal: Patient-Specific Goal (Individualized)  Outcome: Ongoing (see interventions/notes)  Goal: Absence of Hospital-Acquired Illness or Injury  Outcome: Ongoing (see interventions/notes)  Goal: Optimal Comfort and Wellbeing  Outcome: Ongoing (see interventions/notes)  Goal: Rounds/Family Conference  Outcome: Ongoing (see interventions/notes)     Problem: Fall Injury Risk  Goal: Absence of Fall and Fall-Related Injury  Outcome: Ongoing (see interventions/notes)     Problem: Chest Pain  Goal: Resolution of Chest Pain Symptoms  Outcome: Ongoing (see interventions/notes)

## 2020-10-17 NOTE — Consults (Signed)
PATIENT NAME: [name], [name]   HOSPITAL NUMBER:  [id number]  DATE OF SERVICE: 10/17/2020  DATE OF BIRTH:  02-16-1966    CONSULTATION    NEPHROLOGY CONSULTATION    REQUESTING PHYSCIAN:  Merrilee Jansky, MD.    REASON FOR CONSULTATION:  Advanced kidney failure and hypertensive urgency.    HISTORY OF PRESENT ILLNESS:  Ms. [name] is a 55 year old female with a history of clear cell renal cell carcinoma.  She underwent a partial left nephrectomy 2019.  Then, because of recurrence in the left kidney underwent a radical left nephrectomy in June 2021.  AV fistula was placed in November 2021 in anticipation of hemodialysis.  Her GFR since November has ranged from 9-10 mL/minute.  The middle of this week she started experiencing some chest congestion, heaviness with her breathing, and some headache.  She came to the Emergency Department yesterday.  Blood pressure was severely elevated at 206/101.  She had been compliant with her blood pressure medications.  She was started on a nitroglycerin drip and admitted overnight.  Her headache worsened on the nitroglycerin drip.  I saw her this morning.  Her blood pressure had improved and she was breathing better but not back to baseline.    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    PAST SURGICAL HISTORY:  Partial nephrectomy, then radical left nephrectomy, AV fistula for hemodialysis in the left upper arm.    SOCIAL HISTORY:  Noncontributory.    FAMILY HISTORY:  Noncontributory.    REVIEW OF SYSTEMS:  See history of present illness above.  No hemoptysis, pleurisy, chest pain, nausea, or vomiting.  She just diffusely has not felt quite right for some time and is looking forward to starting dialysis.    PHYSICAL EXAMINATION:  Vital signs: Temperature 36.3 degrees centigrade, blood pressure 150/85, respiratory rate 22, pulse ox 98%, weight 130 kg.  No asterixis.  HEENT:  No  signs of trauma.  No jaundice.  Mucous membranes moist.  Lungs:  Trace crackles at both bases which clear with 1 or 2 deep breaths.  No wheezes.  Heart:  No pericardial friction rub or gallop.  Abdomen:  Soft, nontender.  Positive bowel sounds, 1+ pitting edema both ankles.  Skin:  Warm and dry.  Left upper arm AV fistula has thrill and bruit, not well appreciated especially at the antecubital area, but then is difficult to palpate on my physical exam.    LABORATORY DATA:  Hemoglobin 10.1, potassium 4.5, CO2 19, anion gap 12, BUN 51, creatinine 5.7, estimated GFR 9 mL/minute.  Glucose 226, calcium 8.3, albumin 2.9, magnesium 1.6.  Urinalysis positive for glucose, protein, 10-20 RBCs, and 5-10 WBCs per high-power field.    IMPRESSION:  1. Chronic kidney disease stage 5.  2. Hypertensive urgency, improving.  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  4. Secondary hyperparathyroidism.  5. Hypomagnesemia.  6. History of renal cell carcinoma.    RECOMMENDATIONS:  I added magnesium supplements.  I started her on a Lasix drip, continued losartan, added labetalol 100 mg p.o. every 8 hours, increased amlodipine to 10 mg daily.  I reduced nitroglycerin drip to 5 mg/hour with plans to discontinue and systolic blood pressure less than 150 after the above medications have started.  I have ordered an interventional radiology/vascular consult.  They will evaluate the AV fistula in the morning.  If suitable to start using, will do hemodialysis in  the afternoon.  If IR does not feel that the AV fistula is suitable to attempt use, we will then need to determine if she should have a temporary hemodialysis catheter placed or whether we can simply give the fistula more time or if she needs vascular surgeon intervention for improved development.  Dr. Jerrye Beavers is her vascular surgeon who placed the fistula in November.  Dose medications to renally excrete for an estimated creatinine clearance less than 10 mL/minute.  Her blood pressure  medication regimen will change if and when she starts hemodialysis or outpatient medication regimen has not been determined.  If she starts dialysis this admission, then she will need care management to enroll her in outpatient dialysis.  We will follow the patient with you.     Thank you for consulting me on this patient.        Marijo Sanes, MD              CC:   Merrilee Jansky, MD   Fax: [phone]       DD:  10/17/2020 09:53:49  DT:  10/17/2020 12:27:18 TP  D#:  [id number]

## 2020-10-17 NOTE — ED Nurses Note (Signed)
Provider-MKenny at the bedside.

## 2020-10-17 NOTE — Care Plan (Signed)
Problem: Adult Inpatient Plan of Care  Goal: Plan of Care Review  10/17/2020 2349 by Earleen Reaper, RN  Outcome: Ongoing (see interventions/notes)  10/17/2020 2349 by Earleen Reaper, RN  Outcome: Ongoing (see interventions/notes)  10/17/2020 2337 by Earleen Reaper, RN  Outcome: Ongoing (see interventions/notes)  Goal: Patient-Specific Goal (Individualized)  10/17/2020 2349 by Earleen Reaper, RN  Outcome: Ongoing (see interventions/notes)  10/17/2020 2349 by Earleen Reaper, RN  Outcome: Ongoing (see interventions/notes)  10/17/2020 2337 by Earleen Reaper, RN  Outcome: Ongoing (see interventions/notes)  Goal: Absence of Hospital-Acquired Illness or Injury  10/17/2020 2349 by Earleen Reaper, RN  Outcome: Ongoing (see interventions/notes)  10/17/2020 2349 by Earleen Reaper, RN  Outcome: Ongoing (see interventions/notes)  10/17/2020 2337 by Earleen Reaper, RN  Outcome: Ongoing (see interventions/notes)  Intervention: Identify and Manage Fall Risk  10/17/2020 2349 by Earleen Reaper, RN  Flowsheets (Taken 10/17/2020 2349)  Safety Promotion/Fall Prevention:   activity supervised   nonskid shoes/slippers when out of bed   safety round/check completed   fall prevention program maintained  10/17/2020 2337 by Earleen Reaper, RN  Flowsheets (Taken 10/17/2020 2337)  Safety Promotion/Fall Prevention:   activity supervised   nonskid shoes/slippers when out of bed   safety round/check completed   fall prevention program maintained  Intervention: Prevent Skin Injury  10/17/2020 2349 by Earleen Reaper, RN  Flowsheets (Taken 10/17/2020 2349)  Body Position: semi-fowlers (less than 30 degrees)  10/17/2020 2337 by Earleen Reaper, RN  Flowsheets (Taken 10/17/2020 2337)  Body Position: semi-fowlers (less than 30 degrees)  Intervention: Prevent and Manage VTE (Venous Thromboembolism) Risk  10/17/2020 2349 by Earleen Reaper, RN  Flowsheets (Taken 10/17/2020 2349)  VTE Prevention/Management:   ambulation promoted   anticoagulant therapy  maintained  10/17/2020 2337 by Earleen Reaper, [place] (Taken 10/17/2020 2337)  VTE Prevention/Management: anticoagulant therapy maintained  Intervention: Prevent Infection  10/17/2020 2349 by Earleen Reaper, RN  Flowsheets (Taken 10/17/2020 2349)  Infection Prevention:   barrier precautions utilized   promote handwashing   rest/sleep promoted   visitors restricted/screened   single patient room provided  10/17/2020 2337 by Earleen Reaper, [place] (Taken 10/17/2020 2337)  Infection Prevention:   rest/sleep promoted   promote handwashing   visitors restricted/screened   single patient room provided   glycemic control managed  Goal: Optimal Comfort and Wellbeing  10/17/2020 2349 by Earleen Reaper, RN  Outcome: Ongoing (see interventions/notes)  10/17/2020 2349 by Earleen Reaper, RN  Outcome: Ongoing (see interventions/notes)  10/17/2020 2337 by Earleen Reaper, RN  Outcome: Ongoing (see interventions/notes)  Intervention: Monitor Pain and Promote Comfort  10/17/2020 2349 by Earleen Reaper, RN  Note: Assess pain and treat with PRN medications  10/17/2020 2337 by Earleen Reaper, RN  Note: Assess pain and treat with PRN medications  Intervention: Provide Person-Centered Care  10/17/2020 2349 by Earleen Reaper, RN  Flowsheets (Taken 10/17/2020 2349)  Trust Relationship/Rapport:   care explained   questions encouraged   questions answered  10/17/2020 2337 by Earleen Reaper, [place] (Taken 10/17/2020 2337)  Trust Relationship/Rapport:   care explained   questions answered   questions encouraged  Goal: Rounds/Family Conference  10/17/2020 2349 by Earleen Reaper, RN  Outcome: Ongoing (see interventions/notes)  10/17/2020 2349 by Earleen Reaper, RN  Outcome: Ongoing (see interventions/notes)  10/17/2020 2337 by Earleen Reaper, RN  Outcome: Ongoing (see interventions/notes)     Problem: Fall Injury Risk  Goal: Absence of Fall and Fall-Related Injury  10/17/2020 2349 by Earleen Reaper, RN  Outcome:  Ongoing (see  interventions/notes)  10/17/2020 2349 by Earleen Reaper, RN  Outcome: Ongoing (see interventions/notes)  10/17/2020 2337 by Earleen Reaper, RN  Outcome: Ongoing (see interventions/notes)  Intervention: Identify and Manage Contributors  10/17/2020 2349 by Earleen Reaper, RN  Flowsheets (Taken 10/17/2020 2349)  Self-Care Promotion:   independence encouraged   BADL personal objects within reach  Medication Review/Management: medications reviewed  10/17/2020 2337 by Earleen Reaper, RN  Flowsheets (Taken 10/17/2020 2337)  Self-Care Promotion:   independence encouraged   BADL personal objects within reach  Medication Review/Management: medications reviewed  Intervention: Promote Injury-Free Environment  10/17/2020 2349 by Earleen Reaper, RN  Flowsheets (Taken 10/17/2020 2349)  Safety Promotion/Fall Prevention:   activity supervised   nonskid shoes/slippers when out of bed   safety round/check completed   fall prevention program maintained  10/17/2020 2337 by Earleen Reaper, RN  Flowsheets (Taken 10/17/2020 2337)  Safety Promotion/Fall Prevention:   activity supervised   nonskid shoes/slippers when out of bed   safety round/check completed   fall prevention program maintained

## 2020-10-17 NOTE — Progress Notes (Addendum)
Aware of admission for hypertensive urgency and symptoms c/w CHF/fluid overload.   Preliminary assessment:  1. 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  2. HTN urgency with improved BP on NTG drip    Recommendations:  1. I started Lasix drip, and continued Losartan 25 mg daily  2. Labetalol 100 mg po q8hours  3. D/C NTG drip when SBP < 150 mmHg  4. Will assess her AVF which was created in November in anticipation of dialysis. If it appears to be adequately developed for starting dialysis, will discuss starting HD tomorrow.  If the AVF seems questionable, will ask IR to evaluate it tomorrow.  5. I removed fluid restriction from diet order since that is indicated only for hyponatremia and not CHF/"fluid overload", which is actually a salt overload phenomenon.     Full consult to follow today.     Consult Dictated;  I am not sure if AVF is ready to start usiing based on my physical exam this morning. I have placed an IR/vascular consult. IF IR feels it is ready, then I hope to start HD later Monday morning, but if AVF not ready, will have to decide if temporary catheter should be placed or if we can give AVF more time.

## 2020-10-18 ENCOUNTER — Other Ambulatory Visit: Payer: Self-pay

## 2020-10-18 ENCOUNTER — Inpatient Hospital Stay (HOSPITAL_COMMUNITY): Payer: 59

## 2020-10-18 DIAGNOSIS — Z9289 Personal history of other medical treatment: Secondary | ICD-10-CM

## 2020-10-18 DIAGNOSIS — I340 Nonrheumatic mitral (valve) insufficiency: Secondary | ICD-10-CM

## 2020-10-18 DIAGNOSIS — Z4901 Encounter for fitting and adjustment of extracorporeal dialysis catheter: Secondary | ICD-10-CM

## 2020-10-18 DIAGNOSIS — I517 Cardiomegaly: Secondary | ICD-10-CM

## 2020-10-18 DIAGNOSIS — N185 Chronic kidney disease, stage 5: Secondary | ICD-10-CM

## 2020-10-18 DIAGNOSIS — T82858A Stenosis of vascular prosthetic devices, implants and grafts, initial encounter: Secondary | ICD-10-CM

## 2020-10-18 DIAGNOSIS — Z992 Dependence on renal dialysis: Secondary | ICD-10-CM

## 2020-10-18 DIAGNOSIS — I358 Other nonrheumatic aortic valve disorders: Secondary | ICD-10-CM

## 2020-10-18 HISTORY — PX: HX OTHER: 2100001105

## 2020-10-18 HISTORY — DX: Personal history of other medical treatment: Z92.89

## 2020-10-18 LAB — RENAL FUNCTION PANEL
ALBUMIN: 2.5 g/dL — ABNORMAL LOW (ref 3.5–5.0)
ANION GAP: 11 mmol/L (ref 4–13)
BUN/CREA RATIO: 10 (ref 6–22)
BUN: 54 mg/dL — ABNORMAL HIGH (ref 8–25)
CALCIUM: 8.6 mg/dL (ref 8.5–10.0)
CHLORIDE: 111 mmol/L (ref 96–111)
CO2 TOTAL: 18 mmol/L — ABNORMAL LOW (ref 22–30)
CREATININE: 5.29 mg/dL — ABNORMAL HIGH (ref 0.60–1.05)
ESTIMATED GFR: 9 mL/min/BSA — ABNORMAL LOW (ref >=60)
GLUCOSE: 117 mg/dL (ref 65–125)
PHOSPHORUS: 6.5 mg/dL — ABNORMAL HIGH (ref 2.4–4.7)
POTASSIUM: 4.6 mmol/L (ref 3.5–5.1)
SODIUM: 140 mmol/L (ref 136–145)

## 2020-10-18 LAB — CBC W/AUTO DIFF
BASOPHIL #: <0.1 10*3/uL (ref <=0.20)
BASOPHIL %: 1 %
EOSINOPHIL #: <0.1 10*3/uL (ref <=0.50)
EOSINOPHIL %: 1 %
HCT: 27.9 % — ABNORMAL LOW (ref 34.8–46.0)
HGB: 8.7 g/dL — ABNORMAL LOW (ref 11.5–16.0)
IMMATURE GRANULOCYTE #: <0.1 10*3/uL (ref <0.10)
IMMATURE GRANULOCYTE %: 0 % (ref 0–1)
LYMPHOCYTE #: 1.72 10*3/uL (ref 1.00–4.80)
LYMPHOCYTE %: 27 %
MCH: 29.1 pg (ref 26.0–32.0)
MCHC: 31.2 g/dL (ref 31.0–35.5)
MCV: 93.3 fL (ref 78.0–100.0)
MONOCYTE #: 0.5 10*3/uL (ref 0.20–1.10)
MONOCYTE %: 8 %
MPV: 11.8 fL (ref 8.7–12.5)
NEUTROPHIL #: 4.04 10*3/uL (ref 1.50–7.70)
NEUTROPHIL %: 63 %
PLATELETS: 193 10*3/uL (ref 150–400)
RBC: 2.99 10*6/uL — ABNORMAL LOW (ref 3.85–5.22)
RDW-CV: 13.1 % (ref 11.5–15.5)
WBC: 6.4 10*3/uL (ref 3.7–11.0)

## 2020-10-18 LAB — ECG 12-LEAD
Atrial Rate: 106 {beats}/min
Calculated P Axis: 31 degrees
Calculated R Axis: -4 degrees
Calculated T Axis: 34 degrees
PR Interval: 122 ms
QRS Duration: 76 ms
QT Interval: 348 ms
QTC Calculation: 462 ms
Ventricular rate: 106 {beats}/min

## 2020-10-18 LAB — POC FINGERSTICK GLUCOSE - BMC/JMC (RESULTS)
GLUCOSE, POC: 163 mg/dl — ABNORMAL HIGH (ref 60–100)
GLUCOSE, POC: 114 mg/dl — ABNORMAL HIGH (ref 60–100)

## 2020-10-18 LAB — MAGNESIUM: MAGNESIUM: 1.6 mg/dL — ABNORMAL LOW (ref 1.8–2.6)

## 2020-10-18 MED ORDER — HEPARIN (PORCINE) 5,000 UNIT/ML INJECTION SOLUTION
INTRAMUSCULAR | Status: AC
Start: 2020-10-18 — End: 2020-10-18
  Filled 2020-10-18: qty 2

## 2020-10-18 MED ORDER — LIDOCAINE 1 %-EPINEPHRINE 1:100,000 INJECTION SOLUTION
Freq: Once | INTRAMUSCULAR | Status: AC | PRN
Start: 2020-10-18 — End: 2020-10-18
  Administered 2020-10-18 (×3): 5 mL via INTRADERMAL

## 2020-10-18 MED ORDER — LIDOCAINE-PRILOCAINE 2.5 %-2.5 % TOPICAL CREAM
TOPICAL_CREAM | Freq: Once | CUTANEOUS | Status: DC
Start: 2020-10-18 — End: 2020-10-20
  Administered 2020-10-18: 10:00:00 0 mL via TOPICAL
  Filled 2020-10-18: qty 5

## 2020-10-18 MED ORDER — ALPRAZOLAM 0.25 MG TABLET
0.2500 mg | ORAL_TABLET | Freq: Every day | ORAL | Status: DC | PRN
Start: 2020-10-18 — End: 2020-10-20
  Administered 2020-10-18: 0.25 mg via ORAL
  Filled 2020-10-18: qty 1

## 2020-10-18 MED ORDER — HEPARIN (PORCINE) 1,000 UNIT/ML INJECTION FOR DIALYSIS
500.0000 [IU] | INTRAMUSCULAR | Status: AC
Start: 2020-10-18 — End: 2020-10-18
  Administered 2020-10-18: 500 [IU]

## 2020-10-18 MED ORDER — LIDOCAINE 1 %-EPINEPHRINE 1:100,000 INJECTION SOLUTION
INTRAMUSCULAR | Status: AC
Start: 2020-10-18 — End: 2020-10-18
  Filled 2020-10-18: qty 50

## 2020-10-18 MED ORDER — SODIUM CHLORIDE 0.9 % INJECTION SOLUTION
2.0000 mL | INTRAVENOUS | Status: AC
Start: 2020-10-18 — End: 2020-10-18
  Administered 2020-10-18: 09:00:00 2 mL via INTRAVENOUS

## 2020-10-18 MED ORDER — ALPRAZOLAM 0.25 MG TABLET
0.2500 mg | ORAL_TABLET | Freq: Every day | ORAL | Status: DC | PRN
Start: 2020-10-18 — End: 2020-10-18

## 2020-10-18 MED ORDER — HEPARIN (PORCINE) 1,000 UNIT/ML INJECTION FOR DIALYSIS
3200.0000 [IU] | INTRAMUSCULAR | Status: AC
Start: 2020-10-18 — End: 2020-10-18
  Administered 2020-10-18 (×2): 3200 [IU]

## 2020-10-18 NOTE — Ancillary Notes (Signed)
Echo completed at bedside. Definity given and administered by myself.      [place], [facility]  10/18/2020, 09:13

## 2020-10-18 NOTE — Nurses Notes (Addendum)
"HEMODIALYSIS SUMMARY"  PATIENT: [name] MRN: [id number] ROOM: 635/B  Order  HEMODIALYSIS [DIACHI1] (Order [id number])    Zameria, Vogl  Order #: [id number] Accession #: [id number]: [id number]      General Information    No case/log ID found     Order Providers    Authorizing   Marijo Sanes, MD           Order Information    Date Department Ordering/Authorizing   10/17/2020 [facility] 50F Marijo Sanes, MD     Start Date/Time    Start Time   10/18/20 0000     Acknowledgement Info    For At Acknowledged By Acknowledged On   Placing Order 10/17/20 0946 Theodis Shove, RN 10/17/20 6962     Released/Completed Orders    Released On Released By Completed On Completed By   10/17/20 0946 Marijo Sanes, MD (auto-released)               Standing Order Information    Remaining Occurrences Interval Last Released     0/1 ONE TIME 10/17/2020             Released Orders     Released On Order # Scheduled For Released By   1. 10/17/2020 9:46 AM [id number] 10/18/2020 12:00 AM Marijo Sanes, MD (auto-released)           Comments    MAKE SURE THAT IR HAS EVALUATED AVF FIRST, and indicated that AVF is ready to use.   First HD treatment.   Heparin 500 unit/hour           Order Questions    Question Answer Comment   Reason for Treatment: End stage renal disease(CKD-Stage6)    Length of Dialysis (hrs): 3.5    Blood Flow rate (ml/min): 250    Size of fistula needle: 17 gauge - 1 inch    Dialyzer: Nipro 15H    Fluid Removal (kg): 2.0 As tolerated   Dialysate flow rate (ml/min): Other    Other: 400    Acid Bath: 3 K / 2.5 Ca    Bicarb Settings: 38    Sodium Type: Linear    Sodium: Other    Other: 140    Calcium: 2.5    Type of access: Fistula    Dialysate temperature: 36 degrees Celsius            Additional Information    Associated Reports   View Encounter   Priority and Order Details     HEMODIALYSIS    Electronically signed by: Marijo Sanes, MD on 10/17/20 [phone]   Ordering user: Marijo Sanes, MD 10/17/20 [id number] Ordering  provider: Marijo Sanes, MD   Authorized by: Marijo Sanes, MD Ordering mode: Standard   Authorizing Provider NPI information    Marijo Sanes, MD                DGU#:[id number]                  [facility] Result Comments     Not Released  Not seen     Patient Release Status:     This result is automatically blocked from the patient.           Order Marine scientist Info     Order Set Info    Order Set Source   Did not come from Order set  Reprint Order    HEMODIALYSIS (Order [id number]) on 10/17/20            LABS:Results for [name], [name] (MRN [id number]) as of 10/18/2020 17:08   Ref. Range 10/18/2020 05:21   WBC Latest Ref Range: 3.7 - 11.0 x10^3/uL 6.4   HGB Latest Ref Range: 11.5 - 16.0 g/dL 8.7 (L)   HCT Latest Ref Range: 34.8 - 46.0 % 27.9 (L)   PLATELET COUNT Latest Ref Range: 150 - 400 x10^3/uL 193   RBC Latest Ref Range: 3.85 - 5.22 x10^6/uL 2.99 (L)   MCV Latest Ref Range: 78.0 - 100.0 fL 93.3   MCHC Latest Ref Range: 31.0 - 35.5 g/dL 31.2   MCH Latest Ref Range: 26.0 - 32.0 pg 29.1   RDW-CV Latest Ref Range: 11.5 - 15.5 % 13.1   MPV Latest Ref Range: 8.7 - 12.5 fL 11.8   PMN'S Latest Units: % 63   LYMPHOCYTES Latest Units: % 27   EOSINOPHIL Latest Units: % 1   MONOCYTES Latest Units: % 8   BASOPHILS Latest Units: % 1   IMMATURE GRANULOCYTE % Latest Ref Range: 0 - 1 % 0   IMMATURE GRANULOCYTE # Latest Ref Range: <0.10 x10^3/uL <0.10   PMN ABS Latest Ref Range: 1.50 - 7.70 x10^3/uL 4.04   LYMPHS ABS Latest Ref Range: 1.00 - 4.80 x10^3/uL 1.72   EOS ABS Latest Ref Range: <=0.50 x10^3/uL <0.10   MONOS ABS Latest Ref Range: 0.20 - 1.10 x10^3/uL 0.50   BASOS ABS Latest Ref Range: <=0.20 x10^3/uL <0.10   SODIUM Latest Ref Range: 136 - 145 mmol/L 140   POTASSIUM Latest Ref Range: 3.5 - 5.1 mmol/L 4.6   CHLORIDE Latest Ref Range: 96 - 111 mmol/L 111   CARBON DIOXIDE Latest Ref Range: 22 - 30 mmol/L 18 (L)   BUN Latest Ref Range: 8 - 25  mg/dL 54 (H)   CREATININE Latest Ref Range: 0.60 - 1.05 mg/dL 5.29 (H)   GLUCOSE Latest Ref Range: 65 - 125 mg/dL 117   ANION GAP Latest Ref Range: 4 - 13 mmol/L 11   BUN/CREAT RATIO Latest Ref Range: 6 - 22  10   ESTIMATED GLOMERULAR FILTRATION RATE Latest Ref Range: >=60 mL/min/BSA 9 (L)   CALCIUM Latest Ref Range: 8.5 - 10.0 mg/dL 8.6   MAGNESIUM Latest Ref Range: 1.8 - 2.6 mg/dL 1.6 (L)   PHOSPHORUS Latest Ref Range: 2.4 - 4.7 mg/dL 6.5 (H)   ALBUMIN Latest Ref Range: 3.5 - 5.0 g/dL  2.5 (L)     CONSENT SIGNED:  YES   BLOOD CONSENT:      N/A:  YES   DX:   Patient Active Problem List   Diagnosis   . Diabetic foot infection (CMS [place])   . Type 2 diabetes mellitus (CMS HCC)   . Cellulitis of foot, left   . Essential hypertension   . Obesity   . C. difficile diarrhea   . Hyperkalemia   . AKI (acute kidney injury) (CMS HCC)   . Slow transit constipation   . 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 Morbid obesity   . Persistent vomiting   . CKD (chronic kidney disease), stage V (CMS HCC)   . Fluid overload     DIABETIC:  YES   CODE STATUS:  FULL CODE  ALLERGIES:   Allergies   Allergen Reactions   . Vicodin [Hydrocodone-Acetaminophen] Itching     DIET: DIET RENAL (75GM PROT;2GM K: 2GM  NA) Food Preferences: DIABETIC  ACCESS:   RPC   NEEDLE SIZE:  N/A   WT:   128.8 KG    EDW:  N/A     UFG:  2L AS TOLERATED    PRE TX NURSE FROM/TIME:  Theodis Shove RN AT 1607  TIME OUT TIME/SAFETY CHECKS:  3710  HEPATITIS STATUS:  HBSAG   DATE: 10/17/2020     RESULTS:  NEGATIVE  HBSAB   DATE:  10/17/2020     RESULTS:  <10  MOBILITY: BED   YES STRETCHER  1ST TIME ACUTE:  YES     STAT:     ROUTINE:      URGENT:  ACUTE ROOM:   YES    BEDSIDE:     ICU:  ISOLATION PRECAUTION:  DIALYSIS:   YES    [place]:     CONTACT:      REVERSE:      DROPLET:  SPECIAL CONSIDERATIONS: INITIAL TREATMENT  TOTAL CHLORINE < 0.1PPM  TIME:   1245      2ND CHECK TIME:  1645  TREATMENT INITIATION:  1607  BATH K/CA:  3/2.5       DFR:  400  ISOLATED UF  ONLY:  NO  NA:    140    HCO3: 38  INCAPACITATED NURSE EDUCATION COMPLETED:    YES    MULTI-SUITE:      NA:  MD NOTIFIED AT THE START:  DR. [facility]       10/18/20 1552   Machine Checks   Safety Checks P   Machine Number 6   Machine Temp 36 C (96.8 F)   Alarms Set Yes (Pre-Set)   Conductivity 13.6   Independent Conductivity 13.6   [place] -   Independent pH 7.2   Dialysate Used: premixed hemodialysate (NATURALYTE) 3.43 L;with potassium chloride 3 mEq/L;with calcium chloride 2.5 mEq/L   Dialyzer Used: Other  (N15-H)   Med Being Verified DIALYSATE,HEPARIN,HEPLOCK   Med Verification (1st) Clinician Verification of Medication/Blood   Additional Pre- Dialysis Documentation   Hepatitis B Status Checked: Negative Surface Antigen   Hepatitis B Date Drawn 10/17/20   Pre-Dialysis Safety Checks Yes   Total Chlorine LESS THAN 0.1   Dialysis Prescription   Order Confirmed Yes   Pre Dialysis Vitals   Weight (Pre Dialysis) 129 kg (283 lb 15.2 oz)   Weight Source Bed   Temp 36 C (96.8 F)  (AXILLARY)   BP (Non-Invasive) (!) 150/74   BP Source (Non-Invasive) C   Heart Rate 85   SpO2 100 %  (ON ROOM AIR)   Pre Dialysis Assessment   Patient Status Report Received;Stable   Cardiac Pulse Regular   Respiratory Lungs Clear   Mental Status Alert   GI Complaints No Complaint   Fluid Assessment Lower Extremity Edema   Lower Extremity Edema Status Mild   Pain Score 0   Patient Report Received From: Theodis Shove RN AT 6269   Vascular Access   Was Catheter Placed in Dialysis? No   Access Site Right Chest;Right Internal Jugular   Access Type Tunneled Cuffed Catheter   Access Assessment WDL   Catheter Lumen Pre Dialysis Assessment arterial lumen blood return present;venous lumen blood return present   Access Patent Yes   Dialysis Consent   Consent Obtained Written   Date Consent Obtained 10/18/20   Procedure Time Out   Consent Obtained Written   Patient Identification (name and date of birth): Verified Patient Identification Band;Patient  Verbally Identified Self  Patient Position Fowlers (45-60 degrees)   Procedure Verified? Yes  (PT EDUCATED WITH EXPECTATIONS WITH PROCEDURE.PT ASKING QUESTIONS AND ANSWERED.VERBALIZES UNDERSTANDING WITH POC)   Time Out Performed By Susa Day RN   Dialysis Catheter Cuffed/Tunneled   Placement Date/Time: 10/18/20 1307   Pt Loc (Unit/Rm): IR lab  Inserted: (c) In Interventional Radiology;By Physician  Access Site: Right Internal Jugular  Dialysis Catheter Type: Cuffed/Tunneled   Line Status flushed without difficulty;intermittent infusion cap intact   Distal Injection Lumen Status flushed without difficulty;blood return present   SITE ASSESSMENT WDL   DRESSING TYPE 2X2 ATFTD;3U2 Gauze;Transparent   DRESSING STATUS Old Drainage;Clean, Dry and Intact   DRESSING DRAINAGE Scant   Evaluation of Need Hemodialysis   Date Dressing Changed 10/18/20  (POST ACESS PLACEMENT)        10/18/20 1607   Hemodialysis Monitor   Patient Status Stable   Time On 1607  (TREATMENT STARTED)   Vitals and Machine Parameters   BP 139/58   Pulse 85   SpO2 100 %   Blood Flowrate 250 ML/MIN   Arterial Pressure -90 mmHg   Venous Pressure 90 mmHg   Transmembrane Pressure (TMP) 50 mmHg   Ultrafiltration Rate (Dialysis) 710   Fluids Normal Saline   IV Fluid Administered Due To:   (PRIME)   Fluid Volume 250 mL   Fluid Goal 2 Kg   Actual Fluid Goal 2.5 Kg   Patient Observed Yes  (PT NO COMPLAIN AT THIS TIME ASIDE FROM BEING SLIGHT ANXIOUS OF PROCEDURE.THIS RN ANSWERS ALL QUESTIONS BEING ASKED AND MADE PT COMFORTABLE WITH INITIAL TREATMENT.)   Additional Hemodialysis Documentation   [place] On Yes   Dialysis Access Visible Yes   Hemodialysis Line Secure Yes   Machine Serial Number [id number]   RO Serial Number MAIN   [place]arm [place] Time 1547   Dialyzer Lot# [id number]   [place] Lot# [id number]   RO/Machine Log Complete Yes   All Connections Secured Yes   Venous/Arterial parameters set Yes   Prime Given 250   Air Foam detector engages  Yes   Saline  Line double clamped  Yes   Incapacitated Nurse Education Completed  Yes        10/18/20 1615 10/18/20 1630 10/18/20 1645   Hemodialysis Monitor   Patient Status Stable Stable Stable   Vitals and Machine Parameters   BP 134/66 147/65 136/71   Pulse 81 83 81   Blood Flowrate  --  250 ML/MIN  --    Arterial Pressure  --  -100 mmHg  --    Venous Pressure  --  90 mmHg  --    Transmembrane Pressure (TMP)  --  50 mmHg  --    Ultrafiltration Rate (Dialysis)  --  710  --    Actual Fluid Goal 2.5 Kg 2.5 Kg 2.5 Kg   Actual Fluid Removal 107 mL 266 mL 446 mL   Patient Observed Yes Yes  (PT TALKING TO THIS RN) Yes   Additional Hemodialysis Documentation   [place] On Yes Yes Yes   Dialysis Access Visible Yes Yes Yes   Hemodialysis Line Secure Yes Yes Yes        10/18/20 1700 10/18/20 1715 10/18/20 1730   Hemodialysis Monitor   Patient Status Stable Stable Stable   Vitals and Machine Parameters   BP 132/57 116/64 126/49   Pulse 80 81 82   Blood Flowrate 250 ML/MIN  --  250 ML/MIN   Arterial Pressure -90 mmHg  --  -  90 mmHg   Venous Pressure 80 mmHg  --  80 mmHg   Transmembrane Pressure (TMP) 50 mmHg  --  50 mmHg   Ultrafiltration Rate (Dialysis) 710  --  710   Actual Fluid Goal 2.5 Kg 2.5 Kg 2.5 Kg   Actual Fluid Removal 618 mL 804 mL 984 mL   Patient Observed Yes  (PT SLEEPING) Yes Yes  (INTERMITENTLY SLEEPING)   Additional Hemodialysis Documentation   [place] On Yes Yes Yes   Dialysis Access Visible Yes Yes Yes   Hemodialysis Line Secure Yes Yes Yes        10/18/20 1745 10/18/20 1800 10/18/20 1815   Hemodialysis Monitor   Patient Status Stable Stable Stable   Vitals and Machine Parameters   BP 128/54 141/70 139/60   Pulse 80 83 82   Blood Flowrate  --  250 ML/MIN  --    Arterial Pressure  --  100 mmHg  --    Venous Pressure  --  90 mmHg  --    Transmembrane Pressure (TMP)  --  50 mmHg  --    Ultrafiltration Rate (Dialysis)  --  710  --    Actual Fluid Goal 2.5 Kg 2.5 Kg 2.5 Kg   Actual Fluid Removal 1148 mL 1319 mL 1503 mL    Patient Observed Yes Yes  (NO COMPLAIN,ASSISTED WITH SLIGHT REPOSITIONING) Yes   Additional Hemodialysis Documentation   [place] On Yes Yes Yes   Dialysis Access Visible Yes Yes Yes   Hemodialysis Line Secure Yes Yes Yes        10/18/20 1830 10/18/20 1845 10/18/20 1900   Hemodialysis Monitor   Patient Status Stable Stable Stable   Vitals and Machine Parameters   BP 132/73 150/70 146/57   Pulse 81 83 82   Blood Flowrate 250 ML/MIN  --  250 ML/MIN   Arterial Pressure -100 mmHg  --  -100 mmHg   Venous Pressure 90 mmHg  --  90 mmHg   Transmembrane Pressure (TMP) 50 mmHg  --  50 mmHg   Ultrafiltration Rate (Dialysis) 710  --  710   Actual Fluid Goal 2.5 Kg 2.5 Kg 2.5 Kg   Actual Fluid Removal 1710 mL 1920 mL 2090 mL   Patient Observed Yes  (PT LAUGHING WHILE WATCHING TV) Yes Yes  (PT AWAKE TALKING TO THIS RN)   Additional Hemodialysis Documentation   [place] On Yes Yes Yes   Dialysis Access Visible Yes Yes Yes   Hemodialysis Line Secure Yes Yes Yes        10/18/20 1915 10/18/20 1930 10/18/20 1937   Hemodialysis Monitor   Patient Status Stable Stable Stable  (TREATMENT ENDED)   Vitals and Machine Parameters   BP 153/74 136/63  --    Pulse 82 81  --    SpO2  --   --  100 %  (ON ROOM AIR)   Kt/V  --   --  1.13   Blood Flowrate  --  250 ML/MIN 250 ML/MIN   Arterial Pressure  --  -100 mmHg -100 mmHg   Venous Pressure  --  90 mmHg 90 mmHg   Transmembrane Pressure (TMP)  --  50 mmHg 50 mmHg   Ultrafiltration Rate (Dialysis)  --  710 710   Fluids  --   --  Normal Saline   IV Fluid Administered Due To:  --   --  Standard Rinseback   Fluid Volume  --   --  250 mL  Fluid Goal  --   --  2 Kg   Actual Fluid Goal 2.5 Kg 2.5 Kg 2.5 Kg   Actual Fluid Removal 2220 mL 2416 mL 2500 mL   Treatment Total Fluid Removal  --   --  2000 mL   Patient Observed Yes Yes  (AWAKE NO COMPLAIN) Yes  (TOLERATED AND COMPLETED INITIAL TREATMENT)   Additional Hemodialysis Documentation   [place] On Yes Yes Yes   Dialysis Access Visible Yes Yes Yes    Hemodialysis Line Secure Yes Yes Yes        10/18/20 1948   Post Dialysis Vitals   Weight (Post Dialysis) 127 kg (279 lb 8.7 oz)   Weight 129 kg (283 lb 15.2 oz)   Weight Source Bed   Temp 36 C (96.8 F)   Temp Source Axillary   BP 130/51   BP Source (Non-Invasive) C;RA   Pulse 84   RR 16   End Treatment Kt/V 1.13   Post Dialysis Assessment   Time Off 1937   $ Treatment Completed Yes   Cardiac Pulse Regular   Respiratory Lungs Clear   Arterial Catheter Lumen flushed without difficulty;intermittent infusion cap applied   Arterial Lumen Volume 1.6 mL   Arterial Lumen Flushed 10cc of 0.9 NS;Heparin   Venous Catheter Lumen flushed without difficulty;intermittent infusion cap applied   Venous Lumen Volume 1.6 mL   Venous Lumen Flushed 10cc of 0.9 NS;Heparin   Dialyzer Appearance Streaked <50%   Dialysis Outcome Goal Met   Post Hemodialysis Patient Status Report Given;Stable           MEDICATIONS/BLOOD PRODUCTS:   heparin 1,000 units/mL injection for dialysis  [[id number]]    Ordered Dose: 500 Units Route: Intracatheter Frequency: GIVE IN DIALYSIS   Admin Dose: 500 Units      Scheduled Start Date/Time: 10/18/20 1706 End Date/Time: 10/18/20 1758 after 1 doses First Dose: As Scheduled      Admin Instructions:   HEPARIN 500 UNITS BOLUS AT START OF TREATMENT   CAUTION: "High Alert" Medication.           Order Status: Completed Mon Oct 18, 2020 1758, originally scheduled to end    Ordering User: Sula Soda, [place], RN Ordering Date/Time: Mon Oct 18, 2020 1706   Ordering Provider: Marijo Sanes, MD Authorizing Provider: Marijo Sanes, MD         Order part of Order Set:  [facility]: HEMODIALYSIS       Start Date/Time    Start Time   10/18/20 1706     Priority and Order Details    Priority Order Status Class     Routine Completed E-Rx             EDUCATION: Patient:  YES   Others:  Knowledge Base:  Substantial:   Minimal:   YES  None:  Barriers to Learning:         None:  YES   Teaching Method:   Oral:   YES Written:  Visual:     Hands On/ Demo:  Topic:  Access Care: YES   &S of Infection: YES   Fluid Management:  YES  K+:   Procedural:  YES  Albumin:   Medications:  YES Tx Options:   Transplant:    Diet:   Others:  Patient Response:  Verbalized Understanding: YES   Demonstrate Understanding:   Teach Back:    Return Demo:   No Evidence for Learning:  Requires Follow Up: YES  None/N/A:    POST TX NOTES:  PATIENT TOLERATED AND COMPLETED INITIAL TREATMENT.    POST TX ACCESS:    CATHETER LOCKING SOLUTION:  HEPARIN 1000 UNITS/ML: 3200 UNITS  SALINE FLUSH:  10 MLS EACH PORT                                                                  VOLUME ART__1.6__ML    VEN__1.6__ML                                                                  CLAMPED AND CAPPED:    ACCESS FLOW: Good:   YES Poor:   Positional:   Reversed:   Clotted:  BVP__50.7__L  KT/V__1.13__  NET UF_2000___MLS  PRIMARY NURSE REPORT POST TXKatharine Look PRIEST _RN   TIME:  1951

## 2020-10-18 NOTE — Progress Notes (Signed)
[facility]  Internal Medicine  IP Progress Note     Name: [name], [name] Date of Service:  10/18/2020   Date of Birth:  1966-02-15 Date of Admission:  10/16/2020   PCP: Ocie Doyne, NP Attending: Merrilee Jansky, MD   MRN: [id number] Code Status: Full Code     Subjective     Chief complaint: SOB    Interval history: Feels better. Underwent Echocardiogram which mentioned small mobile density on aortic valve. Clinically, there is no symptoms but might require TEE for fruther evaluation. Evaluate AVF if usable will start HD.     Assessment and Plan     Summary: [name] is a 55 y.o. 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 Fluid overload   . CKD (chronic kidney disease), stage V (CMS HCC)   . Essential hypertension       Plan:    1. Fluid overload  Acute Congestive heart failure?  End stage kidney disease  - LUE AVF 11/17 with positive bruit  - will need eval regarding starting dialysis  - Nephrology on board  - IV lasix drip  - Losartan 25 mg daily  - NTG drip discontinued  - Monitor I/O  - Daily weight  - Telemetry  - IR for fistulogram to decide on HD    2. Type 2 NSTEMI  - Troponins are stable and expected to be elevated with kidney disease  - Check Echocardiogram    3. 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 Uncontrolled T2DM: A1C 8.1. Continue DPP. SSI  5. Hyperparathyroidism due CKD: c/w calcitriol    DVT Prophylaxis: Heparin  Code status: Full Code  Disposition: TBD     Physical Exam     BP (!) 148/75   Pulse 80   Temp 36.7 C (98.1 F)   Resp 18   Ht 1.7 m (5' 6.93")   Wt 130 kg (285 lb 12.8 oz)   LMP 10/24/2014 (Approximate)   SpO2 100%   BMI 44.86 kg/m         Physical Exam  Vitals and nursing note reviewed.   Constitutional:       Appearance: She  is obese.   HENT:      Head: Normocephalic and atraumatic.      Mouth/Throat:      Mouth: Mucous membranes are moist.   Eyes:      General: No scleral icterus.     Conjunctiva/sclera: Conjunctivae normal.   Cardiovascular:      Rate and Rhythm: Normal rate and regular rhythm.      Heart sounds: No murmur heard.  Pulmonary:      Effort: Pulmonary effort is normal. No respiratory distress.      Breath sounds: Normal breath sounds. No wheezing.   Abdominal:      General: Bowel sounds are normal. There is no distension.      Palpations: Abdomen is soft.      Tenderness: There is no abdominal tenderness.   Musculoskeletal:         General: No swelling.   Skin:     General: Skin is warm.      Capillary Refill: Capillary refill takes less than 2 seconds.  Coloration: Skin is not jaundiced.   Neurological:      Mental Status: She is alert and oriented to person, place, and time. Mental status is at baseline.   Psychiatric:         Mood and Affect: Mood normal.           Intake and Output     I/O last 24 hours:      Intake/Output Summary (Last 24 hours) at 10/18/2020 1354  Last data filed at 10/18/2020 1100  Gross per 24 hour   Intake 180 ml   Output -   Net 180 ml        Medications     acetaminophen (TYLENOL) tablet, 650 mg, Oral, Q6H PRN  albuterol (PROVENTIL) 2.58m/ 0.5 mL nebulizer solution, 2.5 mg, Nebulization, Q4H PRN   And  ipratropium (ATROVENT) 0.02% nebulizer solution, 0.5 mg, Nebulization, Q4H PRN  ALPRAZolam (XANAX) tablet, 0.25 mg, Oral, Daily PRN  [Held by provider] amLODIPine (NORVASC) tablet, 10 mg, Oral, Daily  atorvastatin (LIPITOR) tablet, 20 mg, Oral, QPM  calcitriol (ROCALTROL) capsule, 0.25 mcg, Oral, Daily  SSIP insulin lispro (HUMALOG) 100 units/mL SubQ pen, 1-5 Units, Subcutaneous, 4x/day AC   And  dextrose 50% (0.5 g/mL) injection - syringe, 12.5 g, Intravenous, Q15 Min PRN  escitalopram (LEXAPRO) tablet, 10 mg, Oral, NIGHTLY  heparin 5,000 unit/mL injection, 5,000 Units, Subcutaneous,  Q8HRS  labetalol (NORMODYNE) tablet, 100 mg, Oral, Q8HRS  lidocaine-prilocaine (EMLA) 2.5%-2.5% topical cream, , Apply Topically, Once  linagliptin (TRADJENTA) tablet, 5 mg, Oral, Daily  losartan (COZAAR) tablet, 25 mg, Oral, Daily  magnesium chloride (SLOW-MAG) delayed release tablet, 128 mg, Oral, 2x/day  nitroGLYCERIN 50 mg in 250 ml D5W premix infusion, 5 mcg/min, Intravenous, Continuous  NS 250 mL flush bag, , Intravenous, Q1H PRN  NS flush syringe, 10 mL, Intravenous, Q8HRS  NS flush syringe, 10 mL, Intravenous, Q1H PRN  omega3-DHA-EPA-fish oil capsule, 1,000 mg, Oral, Daily  ondansetron (ZOFRAN) 2 mg/mL injection, 4 mg, Intravenous, Q6H PRN         Labs       Recent Labs     10/16/20  1835 10/17/20  0743 10/18/20  0521   HGB 10.1* 9.2* 8.7*   WBC 7.0 7.4 6.4   PLTCNT 245 238 193       Recent Labs     10/16/20  1835 10/16/20  2113 10/17/20  0727 10/18/20  0521   SODIUM 137  --  136 140   POTASSIUM 4.5  --  4.6 4.6   MAGNESIUM  --   --  1.6* 1.6*   CALCIUM 8.3*  --  8.6 8.6   CREATININE 5.07*  --  5.08* 5.29*   BUN 51*  --  50* 54*   CO2 19*  --  18* 18*   BILIRUBIN  --  Negative  --   --        HEMOGLOBIN A1C   Date Value Ref Range Status   10/25/2017 6.7 (H) <5.7 % Final     ERYTHROCYTE SEDIMENTATION RATE (ESR)   Date Value Ref Range Status   08/20/2019 55 (H) 0 - 30 mm/hr Final         Reviewed: I have reviewed all lab results.     Imaging studies          Reviewed: I have reviewed all lab results.     Microbiology     BLOOD CULTURE, ROUTINE   Date Value Ref Range Status   03/27/2020  No Growth 5 Days  Final   03/27/2020 No Growth 5 Days  Final            Medical Decision Making     All pertinent labs were personally reviewed with additional follow-up lab work ordered as deemed clinically appropriate.  All available imaging results and EKGs were reviewed and independently interpreted.  Pertinent previous medical record results were reviewed.  Further workup and treatment depending on clinical course      Provider: Merrilee Jansky, MD Date: 10/18/2020

## 2020-10-18 NOTE — Care Management Notes (Signed)
HD referral initiated via [facility] portal.  All clinical information faxed.       CM will continue to follow and attempt discharge planning for any further needs at bedside 10/19/2020.

## 2020-10-18 NOTE — Progress Notes (Signed)
[facility]  Nephrology Consult Follow Up Note      Date of service: 10/18/2020  Hospital Day:  LOS: 1 day   Encounter Start Date: 10/16/2020  Inpatient Admission Date: 10/17/2020    SUBJECTIVE: BP much better controlled. Headache resolved.  No dyspnea.     OBJECTIVE:    Vital Signs:  Temp (24hrs) Max:36.8 C (98.3 F)      Temperature: 36.7 C (98.1 F)  BP (Non-Invasive): (!) 148/75  MAP (Non-Invasive): 93 mmHG  Heart Rate: 80  Respiratory Rate: 18  SpO2: 127 %    Systolic (51ZGY), FVC:944 , Min:132 , HQP:591     Diastolic (63WGY), KZL:93, Min:65, Max:76    MAP:  MAP (Non-Invasive)  Avg: 99.2 mmHG  Min: 80 mmHG  Max: 135 mmHG  Heart Rate:  Pulse  Avg: 92.6  Min: 77  Max: 120    Dialysis treatment total fluid removal:     Post dialysis weight:       Diet Order:  DIET RENAL (75GM PROT;2GM K: 2GM NA) Food Preferences: DIABETIC  Nutrition:    Orders Placed This Encounter   No orders of the following type(s) were placed in this encounter: Nourishments.     IV Fluids, Meds, and Drips: nitroGLYCERIN in 5 % dextrose, Last Rate: Stopped (10/17/20 1151)        I/O:  I/O last 24 hours:      Intake/Output Summary (Last 24 hours) at 10/18/2020 1234  Last data filed at 10/18/2020 1100  Gross per 24 hour   Intake 180 ml   Output -   Net 180 ml     I/O current shift:  01/24 0700 - 01/24 1859  In: 180 [P.O.:180]  Out: -   I/O last 3 completed shifts:  In: 240 [P.O.:240]  Out: -     Labs:  I have reviewed all lab results.    Imaging:  N/A    Current Medications:  acetaminophen (TYLENOL) tablet, 650 mg, Oral, Q6H PRN  albuterol (PROVENTIL) 2.5mg / 0.5 mL nebulizer solution, 2.5 mg, Nebulization, Q4H PRN   And  ipratropium (ATROVENT) 0.02% nebulizer solution, 0.5 mg, Nebulization, Q4H PRN  ALPRAZolam (XANAX) tablet, 0.25 mg, Oral, HS PRN  [Held by provider] amLODIPine (NORVASC) tablet, 10 mg, Oral, Daily  atorvastatin (LIPITOR) tablet, 20 mg, Oral, QPM  calcitriol (ROCALTROL) capsule, 0.25 mcg, Oral, Daily  SSIP insulin lispro  (HUMALOG) 100 units/mL SubQ pen, 1-5 Units, Subcutaneous, 4x/day AC   And  dextrose 50% (0.5 g/mL) injection - syringe, 12.5 g, Intravenous, Q15 Min PRN  escitalopram (LEXAPRO) tablet, 10 mg, Oral, NIGHTLY  heparin 5,000 unit/mL injection, 5,000 Units, Subcutaneous, Q8HRS  labetalol (NORMODYNE) tablet, 100 mg, Oral, Q8HRS  lidocaine-prilocaine (EMLA) 2.5%-2.5% topical cream, , Apply Topically, Once  linagliptin (TRADJENTA) tablet, 5 mg, Oral, Daily  losartan (COZAAR) tablet, 25 mg, Oral, Daily  magnesium chloride (SLOW-MAG) delayed release tablet, 128 mg, Oral, 2x/day  nitroGLYCERIN 50 mg in 250 ml D5W premix infusion, 5 mcg/min, Intravenous, Continuous  NS 250 mL flush bag, , Intravenous, Q1H PRN  NS flush syringe, 10 mL, Intravenous, Q8HRS  NS flush syringe, 10 mL, Intravenous, Q1H PRN  omega3-DHA-EPA-fish oil capsule, 1,000 mg, Oral, Daily  ondansetron (ZOFRAN) 2 mg/mL injection, 4 mg, Intravenous, Q6H PRN        Physical Exam:  Constitutional:  appears in good health and moderately obese  Respiratory:  Clear to auscultation bilaterally.   Cardiovascular:  regular rate and rhythm  Gastrointestinal:  non-distended  Integumentary:  Skin warm and dry    Impression/Recommendations:  1. CKD stage 5. IR assessment of AVF, then proceed with HD if it looks OK.   2. HTN urgency resolved. Will need adjustment of meds prior to discharge.     Marijo Sanes, MD

## 2020-10-18 NOTE — Care Plan (Signed)
Problem: Adult Inpatient Plan of Care  Goal: Plan of Care Review  Outcome: Ongoing (see interventions/notes)  Goal: Patient-Specific Goal (Individualized)  Outcome: Ongoing (see interventions/notes)  Goal: Absence of Hospital-Acquired Illness or Injury  Outcome: Ongoing (see interventions/notes)  Goal: Optimal Comfort and Wellbeing  Outcome: Ongoing (see interventions/notes)  Goal: Rounds/Family Conference  Outcome: Ongoing (see interventions/notes)     Problem: Fall Injury Risk  Goal: Absence of Fall and Fall-Related Injury  Outcome: Ongoing (see interventions/notes)

## 2020-10-19 ENCOUNTER — Other Ambulatory Visit (INDEPENDENT_AMBULATORY_CARE_PROVIDER_SITE_OTHER): Payer: Self-pay | Admitting: VASCULAR SURGERY

## 2020-10-19 DIAGNOSIS — E1122 Type 2 diabetes mellitus with diabetic chronic kidney disease: Secondary | ICD-10-CM

## 2020-10-19 DIAGNOSIS — I120 Hypertensive chronic kidney disease with stage 5 chronic kidney disease or end stage renal disease: Principal | ICD-10-CM

## 2020-10-19 DIAGNOSIS — I160 Hypertensive urgency: Secondary | ICD-10-CM

## 2020-10-19 DIAGNOSIS — N185 Chronic kidney disease, stage 5: Secondary | ICD-10-CM

## 2020-10-19 LAB — CBC W/AUTO DIFF
BASOPHIL #: <0.1 10*3/uL (ref <=0.20)
BASOPHIL %: 0 %
EOSINOPHIL #: <0.1 10*3/uL (ref <=0.50)
EOSINOPHIL %: 1 %
HCT: 28.8 % — ABNORMAL LOW (ref 34.8–46.0)
HGB: 8.9 g/dL — ABNORMAL LOW (ref 11.5–16.0)
IMMATURE GRANULOCYTE #: <0.1 10*3/uL (ref <0.10)
IMMATURE GRANULOCYTE %: 0 % (ref 0–1)
LYMPHOCYTE #: 1.27 10*3/uL (ref 1.00–4.80)
LYMPHOCYTE %: 23 %
MCH: 28.3 pg (ref 26.0–32.0)
MCHC: 30.9 g/dL — ABNORMAL LOW (ref 31.0–35.5)
MCV: 91.4 fL (ref 78.0–100.0)
MONOCYTE #: 0.55 10*3/uL (ref 0.20–1.10)
MONOCYTE %: 10 %
MPV: 11.4 fL (ref 8.7–12.5)
NEUTROPHIL #: 3.72 10*3/uL (ref 1.50–7.70)
NEUTROPHIL %: 66 %
PLATELETS: 201 10*3/uL (ref 150–400)
RBC: 3.15 10*6/uL — ABNORMAL LOW (ref 3.85–5.22)
RDW-CV: 13.2 % (ref 11.5–15.5)
WBC: 5.6 10*3/uL (ref 3.7–11.0)

## 2020-10-19 LAB — POC FINGERSTICK GLUCOSE - BMC/JMC (RESULTS)
GLUCOSE, POC: 119 mg/dl — ABNORMAL HIGH (ref 60–100)
GLUCOSE, POC: 174 mg/dl — ABNORMAL HIGH (ref 60–100)
GLUCOSE, POC: 195 mg/dl — ABNORMAL HIGH (ref 60–100)
GLUCOSE, POC: 142 mg/dl — ABNORMAL HIGH (ref 60–100)

## 2020-10-19 LAB — RENAL FUNCTION PANEL
ALBUMIN: 2.6 g/dL — ABNORMAL LOW (ref 3.5–5.0)
ANION GAP: 11 mmol/L (ref 4–13)
BUN/CREA RATIO: 8 (ref 6–22)
BUN: 31 mg/dL — ABNORMAL HIGH (ref 8–25)
CALCIUM: 8.4 mg/dL — ABNORMAL LOW (ref 8.5–10.0)
CHLORIDE: 102 mmol/L (ref 96–111)
CO2 TOTAL: 25 mmol/L (ref 22–30)
CREATININE: 3.68 mg/dL — ABNORMAL HIGH (ref 0.60–1.05)
ESTIMATED GFR: 13 mL/min/BSA — ABNORMAL LOW (ref >=60)
GLUCOSE: 133 mg/dL — ABNORMAL HIGH (ref 65–125)
PHOSPHORUS: 5.1 mg/dL — ABNORMAL HIGH (ref 2.4–4.7)
POTASSIUM: 4 mmol/L (ref 3.5–5.1)
SODIUM: 138 mmol/L (ref 136–145)

## 2020-10-19 LAB — MAGNESIUM: MAGNESIUM: 1.7 mg/dL — ABNORMAL LOW (ref 1.8–2.6)

## 2020-10-19 MED ORDER — LOSARTAN 25 MG TABLET
25.0000 mg | ORAL_TABLET | Freq: Every evening | ORAL | Status: DC
Start: 2020-10-19 — End: 2020-10-20
  Administered 2020-10-19: 22:00:00 25 mg via ORAL
  Filled 2020-10-19: qty 1

## 2020-10-19 MED ORDER — LABETALOL 100 MG TABLET
100.0000 mg | ORAL_TABLET | Freq: Two times a day (BID) | ORAL | Status: DC
Start: 2020-10-19 — End: 2020-10-20
  Administered 2020-10-19 (×2): 100 mg via ORAL
  Filled 2020-10-19 (×2): qty 1

## 2020-10-19 MED ORDER — TRAMADOL 50 MG TABLET
25.0000 mg | ORAL_TABLET | Freq: Two times a day (BID) | ORAL | Status: DC | PRN
Start: 2020-10-19 — End: 2020-10-20
  Administered 2020-10-19: 25 mg via ORAL
  Filled 2020-10-19 (×2): qty 1

## 2020-10-19 NOTE — Progress Notes (Addendum)
[facility]  Internal Medicine  IP Progress Note     Name: [name], [name] Date of Service:  10/19/2020   Date of Birth:  07-01-1966 Date of Admission:  10/16/2020   PCP: Ocie Doyne, NP Attending: Merrilee Jansky, MD   MRN: [id number] Code Status: Full Code     Subjective     Chief complaint: SOB    Interval history: Underwent [facility] placement. Vascular will see next Wednesday for AVF. Arranging OP dialysis. Can be discharged once set up. Patient had a mobile density on her aortic valve which might require TEE. No signs and symptoms to suggest IE but will refer as outpatient for cards to decide.    Patient is complaining of significant pain at [facility] site. Tramadol 25 mg bid ordered. Renally adjusted. Will monitor if pain continues.     Assessment and Plan     Summary: [name] is a 55 y.o. 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 Fluid overload   . CKD (chronic kidney disease), stage V (CMS HCC)   . Essential hypertension       Plan:    1. Fluid overload  Acute Congestive heart failure?  End stage kidney disease  - LUE AVF 11/17 with positive bruit  - Nephrology on board  - Losartan 25 mg every evening  - Monitor I/O  - Daily weight  - Telemetry  - Started HD through [facility]  - Amlodipine discontinued  - Labetalol 100 mg bid PO  - Vascular will require superficialization of left AVF which will be done outpatient on Wednesday 10/27/2020    2. Type 2 NSTEMI  - Troponins are stable and expected to be elevated with kidney disease  - Echocardiogram 55-60%, small mobile density on aortic valve  - no S&S to suggest IE, will refer to cards outpatient to decide on TEE.    3. 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 Uncontrolled T2DM: A1C 8.1. Continue DPP.  SSI  5. Hyperparathyroidism due CKD: c/w calcitriol    DVT Prophylaxis: Heparin  Code status: Full Code  Disposition: TBD     Physical Exam     BP (!) 131/49   Pulse 81   Temp 36.8 C (98.2 F)   Resp 19   Ht 1.7 m (5' 6.93")   Wt 129 kg (283 lb 15.2 oz)   LMP 10/24/2014 (Approximate)   SpO2 91%   BMI 44.57 kg/m         Physical Exam  Vitals and nursing note reviewed.   Constitutional:       Appearance: She is obese.   HENT:      Head: Normocephalic and atraumatic.      Mouth/Throat:      Mouth: Mucous membranes are moist.   Eyes:      General: No scleral icterus.     Conjunctiva/sclera: Conjunctivae normal.   Cardiovascular:      Rate and Rhythm: Normal rate and regular rhythm.      Heart sounds: No murmur heard.  Pulmonary:      Effort: Pulmonary effort is normal. No respiratory distress.      Breath sounds: Normal breath sounds. No wheezing.   Abdominal:      General: Bowel  sounds are normal. There is no distension.      Palpations: Abdomen is soft.      Tenderness: There is no abdominal tenderness.   Musculoskeletal:         General: No swelling.   Skin:     General: Skin is warm.      Capillary Refill: Capillary refill takes less than 2 seconds.      Coloration: Skin is not jaundiced.   Neurological:      Mental Status: She is alert and oriented to person, place, and time. Mental status is at baseline.   Psychiatric:         Mood and Affect: Mood normal.           Intake and Output     I/O last 24 hours:      Intake/Output Summary (Last 24 hours) at 10/19/2020 1214  Last data filed at 10/19/2020 1000  Gross per 24 hour   Intake 240 ml   Output 2000 ml   Net -1760 ml        Medications     acetaminophen (TYLENOL) tablet, 650 mg, Oral, Q6H PRN  albuterol (PROVENTIL) 2.72m/ 0.5 mL nebulizer solution, 2.5 mg, Nebulization, Q4H PRN   And  ipratropium (ATROVENT) 0.02% nebulizer solution, 0.5 mg, Nebulization, Q4H PRN  ALPRAZolam (XANAX) tablet, 0.25 mg, Oral, Daily PRN  atorvastatin (LIPITOR) tablet, 20 mg,  Oral, QPM  calcitriol (ROCALTROL) capsule, 0.25 mcg, Oral, Daily  SSIP insulin lispro (HUMALOG) 100 units/mL SubQ pen, 1-5 Units, Subcutaneous, 4x/day AC   And  dextrose 50% (0.5 g/mL) injection - syringe, 12.5 g, Intravenous, Q15 Min PRN  escitalopram (LEXAPRO) tablet, 10 mg, Oral, NIGHTLY  heparin 5,000 unit/mL injection, 5,000 Units, Subcutaneous, Q8HRS  labetalol (NORMODYNE) tablet, 100 mg, Oral, 2x/day  lidocaine-prilocaine (EMLA) 2.5%-2.5% topical cream, , Apply Topically, Once  linagliptin (TRADJENTA) tablet, 5 mg, Oral, Daily  losartan (COZAAR) tablet, 25 mg, Oral, QPM  NS 250 mL flush bag, , Intravenous, Q1H PRN  NS flush syringe, 10 mL, Intravenous, Q8HRS  NS flush syringe, 10 mL, Intravenous, Q1H PRN  omega3-DHA-EPA-fish oil capsule, 1,000 mg, Oral, Daily  ondansetron (ZOFRAN) 2 mg/mL injection, 4 mg, Intravenous, Q6H PRN  traMADol (ULTRAM) tablet, 25 mg, Oral, Q12H PRN         Labs       Recent Labs     10/17/20  0743 10/18/20  0521 10/19/20  0541   HGB 9.2* 8.7* 8.9*   WBC 7.4 6.4 5.6   PLTCNT 238 193 201       Recent Labs     10/16/20  2113 10/17/20  0727 10/18/20  0521 10/19/20  0541   SODIUM  --  136 140 138   POTASSIUM  --  4.6 4.6 4.0   MAGNESIUM  --  1.6* 1.6* 1.7*   CALCIUM  --  8.6 8.6 8.4*   CREATININE  --  5.08* 5.29* 3.68*   BUN  --  50* 54* 31*   CO2  --  18* 18* 25   BILIRUBIN Negative  --   --   --        HEMOGLOBIN A1C   Date Value Ref Range Status   10/25/2017 6.7 (H) <5.7 % Final     ERYTHROCYTE SEDIMENTATION RATE (ESR)   Date Value Ref Range Status   08/20/2019 55 (H) 0 - 30 mm/hr Final         Reviewed: I have reviewed all lab results.  Imaging studies     Results for orders placed or performed during the hospital encounter of 10/16/20 (from the past 24 hour(s))   IR US DUPLEX HEMODIALYSIS ACCESS     Status: None    Narrative    RADIOLOGIST: Austin Miles, MD  EXAMINATION: IR US [place] DATE/TIME:  10/18/2020 12:27 PM  CLINICAL INDICATION: Assess LUA AVF for  suitability to start HD on 10/18/20    PROCEDURE:   Doppler Ultrasound of:  AV Fistula  Location: Left Upper Arm    History:  Failure to mature      Technique: Grayscale, color flow Doppler imaging and spectral Doppler waveform analysis was performed.      Impression    Findings and Impression: The patient has a left upper arm AV fistula. There is approximately 20% juxta anastomotic stenosis. There was a 5 mm collateral vein arising approximately 12 cm from the anastomosis. The vein of the fistula measures 7 mm, but is greater than 15 mm deep in the mid humerus level and greater than 20 mm deep in the proximal humerus level. There is no hematoma. No pseudoaneurysm or thrombus.        Radiologist location ID: [id number]     IR IR/VASCULAR MEDICINE CONSULT     Status: None    Narrative    Date: 10/18/2020 12:28 PM                                                        Interventional Radiology Visit     Chief Complaint/ Reason for consult/visit : We will consult the to evaluate the patient's left upper arm AV fistula.    History of Present Illness:The patient had a left upper arm AV fistula placed on half months ago. Patient needs dialysis today. On clinical inspection, the fistula was felt to be too deep.    Review of Systems:Patient reports anxiety and mild shortness of breath. Denies any arm pain or weakness or color changes.    Past Medical and Surgical History: Reviewed.      Meds: Reviewed.      Allergies: Reviewed.    Family History:Reviewed.   Non contributary    Social History:Reviewed.      Relevant Labs and imaging findings: Independently reviewed by me and discussed with patient/ family.  An ultrasound was done of the left upper extremity AV fistula.    FINDINGS:  The patient has a left upper arm AV fistula. There is approximately 20% juxta anastomotic stenosis. There was a 5 mm collateral vein arising approximately 12 cm from the anastomosis. The vein of the fistula measures 7 mm, but is greater than 15 mm deep  in the mid humerus level and greater than 20 mm deep in the proximal humerus level. There is no hematoma. No pseudoaneurysm or thrombus.        Relevant clinical notes : Reviewed.    Clinical Examination:  Vitals are stable, recorded in the chart.  I could not palpate the fistula.      Impression    Impression:  The patient was informed that her AV fistula is adequate size. However it is deep and it is not be very difficult to access it without image guidance. Patient needs dialysis today and we will Araceli Bouche place a tunneled hemodialysis  catheter in the right IJ vein. The patient will be referred to her vascular surgeon for revision to make the fistula more superficial. At the same time, he can ligate the collateral vein.    Patient management was discussed with :    25 minutes were spent for the following clinical activities, directly related to today's encounter: care coordination, counseling and educating the patient, family and/or caregiver, documenting clinical information in the electronic or other health record, independently interpreting results and communicating results to the patient, family and/or caregiver, getting and/or reviewing separately obtained history, ordering medications, tests or procedures, performing a medically appropriate exam and/or evaluation, preparing to see the patient (e.g., reviewing tests), referring the patient to and communicating with other health care professionals.        Portions of this note may be dictated using voice recognition software or a dictation service. Variances in spelling and vocabulary are possible and unintentional. Not all errors are caught/corrected. Please notify the [facility] if any discrepancies are noted or if the meaning of any statement is not clear.      [place] location ID: [id number]     IR TUNNEL DIALYSIS CATHETER PLACEMENT     Status: None    Narrative    RADIOLOGIST: Austin Miles, MD  EXAM DATE/TIME:  10/18/2020 1:09 PM    PROCEDURE:  Venous  access with ultrasound guidance  Tunneled Hemodialysis catheter insertion under fluoroscopic guidance  Additional procedure(s): None      Indication (QCDR): Performance of hemodialysis    PROCEDURE DETAILS:  Consent: The risks, benefits, and alternatives of the procedure were discussed with the patient. Verbal and written consent was obtained..   Time-out was performed prior to the procedure.  Preparation (MIPS): The site was prepared and draped using all elements of maximal sterile barrier technique including sterile gloves, sterile gown, cap, mask, large sterile sheet, sterile ultrasound probe cover, hand hygiene and cutaneous antisepsis with 2% chlorhexidine.   Medical reason for site preparation exception (MIPS): Not applicable    Meds given:  The patient was administered the following meds, in carefully titrated doses:  None      The patient was monitored by the IR Nurse, under attending supervision, with continuous monitoring of the patients level of consciousness and physiologic status.    Local anesthesia was administered using: 1% Lidocaine with Epinephrine.         Access  Vein accessed: Right  Internal jugular vein  The vessel was sonographically evaluated and determined to be patent. Real time ultrasound was used to visualize needle entry into the vessel and a permanent image was stored.  Access technique: Micropuncture set with 21 gauge needle    Catheter placement  An incision was made near the venous access site and the catheter was tunneled subcutaneously to the venous access. The catheter was advanced via a peel-away sheath into the vein under fluoroscopic guidance.   The catheter tip was in the expected location of : cavoatrial junction.  Catheter tip location was fluoroscopically verified and a permanent image was stored.  Both lumens were checked for adequate flow.    Catheter size Leeroy Bock): 14.5  Catheter Length (CM):  Catheter placed:  Catheter flush: Heparin (5000 units/mL)    Closure  Access  site closure technique: Tissue adhesive  Catheter securement technique: Non-absorbable suture  A sterile dressing was applied.    Complications: No immediate complications.    Estimated blood loss (mL): Less than 10  PATIENT DISPOSITION: After  observation, discharged home in stable condition.Marland Kitchen     POST PROCEDURE INSTRUCTIONS:  Post procedure instructions were given.        Impression    Interventional Radiology Procedure, as described above.  Additional Follow up Instructions:    Plan:   The catheter may be used immediately.      Radiation Dose:  Fluoroscopy time: minutes    MIPS Measure #145: performance measure met                                    Radiologist location ID: [id number]         Reviewed: I have reviewed all lab results.     Microbiology     BLOOD CULTURE, ROUTINE   Date Value Ref Range Status   03/27/2020 No Growth 5 Days  Final   03/27/2020 No Growth 5 Days  Final            Medical Decision Making     All pertinent labs were personally reviewed with additional follow-up lab work ordered as deemed clinically appropriate.  All available imaging results and EKGs were reviewed and independently interpreted.  Pertinent previous medical record results were reviewed.  Further workup and treatment depending on clinical course     Provider: Merrilee Jansky, MD Date: 10/19/2020

## 2020-10-19 NOTE — Care Management Notes (Signed)
10/19/20 1600   Assessment Detail   Assessment Type Admission   Date of Care Management Update 10/19/20   Social Work Plan   Discharge Planning Status initial meeting   Anticipated Discharge Disposition   (HD pending)     Met with patient at bedside to discuss discharge planning /needs, and HD arrangements.  Patient states she lives with her husband and their three children. Family resides in a single level house with attic.  Patient is independent, drives, and has adequate transportation..  She denies use of DME, and obtains prescription medications from [facility].    Patient and I discussed HD arrangements.  I explained that I spoke with TJ, [facility] Liaison concerning clinic location and potential chair time.  Likely, patient will be scheduled at [facility].  She informed me she works from 8101-7510 daily, and would prefer a chair time after 1400.  She does plan to be off work for approximately one month after discharge, and can adjust work hours if needed.  I agree to follow up with [place] concerning request.    Telephoned TJ and informed him of situation.  He plans to follow with clinic, etc to arrange chair time. He requests clinical information be re-faxed to [facility] at this time.  Fax was previously not received.  Fax sent.    CM will continue to follow.

## 2020-10-19 NOTE — Care Plan (Signed)
Problem: Adult Inpatient Plan of Care  Goal: Plan of Care Review  Outcome: Ongoing (see interventions/notes)  Goal: Patient-Specific Goal (Individualized)  Outcome: Ongoing (see interventions/notes)  Goal: Absence of Hospital-Acquired Illness or Injury  Outcome: Ongoing (see interventions/notes)  Goal: Optimal Comfort and Wellbeing  Outcome: Ongoing (see interventions/notes)  Goal: Rounds/Family Conference  Outcome: Ongoing (see interventions/notes)     Problem: Fall Injury Risk  Goal: Absence of Fall and Fall-Related Injury  Outcome: Ongoing (see interventions/notes)

## 2020-10-19 NOTE — Progress Notes (Signed)
[facility]  Nephrology Consult Follow Up Note      Date of service: 10/19/2020  Hospital Day:  LOS: 2 days   Encounter Start Date: 10/16/2020  Inpatient Admission Date: 10/17/2020    SUBJECTIVE: AVF needs surgical revision before using. Tunneled Catheter placed. Had first HD treatment yesterday. Sore at catheter site. BP better as expected.     OBJECTIVE:    Vital Signs:  Temp (24hrs) Max:36.8 C (98.2 F)      Temperature: 36.8 C (98.2 F)  BP (Non-Invasive): (!) 131/49  MAP (Non-Invasive): 68 mmHG  Heart Rate: 81  Respiratory Rate: 19  SpO2: 91 %    Systolic (20URK), YHC:623 , Min:109 , JSE:831     Diastolic (51VOH), YWV:37, Min:47, Max:74    MAP:  MAP (Non-Invasive)  Avg: 94.8 mmHG  Min: 62 mmHG  Max: 135 mmHG  Heart Rate:  Pulse  Avg: 91  Min: 77  Max: 120    Dialysis treatment total fluid removal:  Treatment Total Fluid Removal: 2000 mL  Post dialysis weight:  Weight (Post Dialysis): 127 kg (279 lb 8.7 oz)    Diet Order:  DIET RENAL (75GM PROT;2GM K: 2GM NA) Food Preferences: DIABETIC  Nutrition:    Orders Placed This Encounter   No orders of the following type(s) were placed in this encounter: Nourishments.     IV Fluids, Meds, and Drips:      I/O:  I/O last 24 hours:      Intake/Output Summary (Last 24 hours) at 10/19/2020 1322  Last data filed at 10/19/2020 1000  Gross per 24 hour   Intake 240 ml   Output 2000 ml   Net -1760 ml     I/O current shift:  01/25 0700 - 01/25 1859  In: 240 [P.O.:240]  Out: -   I/O last 3 completed shifts:  In: 180 [P.O.:180]  Out: 2000 [Dialysis:2000]    Labs:  I have reviewed all lab results.    Imaging:  N/A    Current Medications:  acetaminophen (TYLENOL) tablet, 650 mg, Oral, Q6H PRN  albuterol (PROVENTIL) 2.5mg / 0.5 mL nebulizer solution, 2.5 mg, Nebulization, Q4H PRN   And  ipratropium (ATROVENT) 0.02% nebulizer solution, 0.5 mg, Nebulization, Q4H PRN  ALPRAZolam (XANAX) tablet, 0.25 mg, Oral, Daily PRN  atorvastatin (LIPITOR) tablet, 20 mg, Oral, QPM  calcitriol  (ROCALTROL) capsule, 0.25 mcg, Oral, Daily  SSIP insulin lispro (HUMALOG) 100 units/mL SubQ pen, 1-5 Units, Subcutaneous, 4x/day AC   And  dextrose 50% (0.5 g/mL) injection - syringe, 12.5 g, Intravenous, Q15 Min PRN  escitalopram (LEXAPRO) tablet, 10 mg, Oral, NIGHTLY  heparin 5,000 unit/mL injection, 5,000 Units, Subcutaneous, Q8HRS  labetalol (NORMODYNE) tablet, 100 mg, Oral, 2x/day  lidocaine-prilocaine (EMLA) 2.5%-2.5% topical cream, , Apply Topically, Once  linagliptin (TRADJENTA) tablet, 5 mg, Oral, Daily  losartan (COZAAR) tablet, 25 mg, Oral, QPM  NS 250 mL flush bag, , Intravenous, Q1H PRN  NS flush syringe, 10 mL, Intravenous, Q8HRS  NS flush syringe, 10 mL, Intravenous, Q1H PRN  omega3-DHA-EPA-fish oil capsule, 1,000 mg, Oral, Daily  ondansetron (ZOFRAN) 2 mg/mL injection, 4 mg, Intravenous, Q6H PRN  traMADol (ULTRAM) tablet, 25 mg, Oral, Q12H PRN        Physical Exam:  Constitutional:  moderately obese and no distress  Respiratory:  Clear to auscultation bilaterally.   Cardiovascular:  no rub  Gastrointestinal:  Soft, non-tender  Integumentary:  Skin warm and dry  Neurologic:  No asterixis    Impression/Recommendations:  1.  ESRD. Next HD tomorrow  2. Needs outpatient HD unit Enrollment  3. HTN improved. Amlodipine stopped. Labetalol changed to 100 mg bid and Losartan changed to every evening.   4. I wrote consult to Dr. Jerrye Beavers to re-evaluate and schedule surgical revision.   OK to discharge when outpatient arrangements are confirmed.     Marijo Sanes, MD

## 2020-10-19 NOTE — Nurses Notes (Signed)
Paged Dr. Jerrye Beavers at this time for vascular surgery consult.

## 2020-10-19 NOTE — Pharmacy (Signed)
[facility]  [place], [place] [address]  Pharmacy Chart Review      Lizann Edelman  11/25/1965  [id number]    For this patient, I have reviewed their medication profile and have provided some recommendations.    Please consider the following changes:    Consider adding Magnesium Oxide 400mg  PO BID due to magnesium level being at 1.7.    Martinique Horshaw, [place] STUDENT  10/19/2020, 10:30    Marin Roberts, [place]  10/19/2020, 13:30

## 2020-10-19 NOTE — Nurses Notes (Signed)
Paged MD as patient refusing her Tradjenta, patient reports taking Januvia at home.

## 2020-10-19 NOTE — Consults (Signed)
[facility]  Vascular Surgery   Consult Initial    Date of Service: 10/19/2020  [name]  MRN: [id number]  Encounter Start Date:  10/16/2020  Inpatient Admission Date:  10/17/2020  Admission Source: Home  Date of Birth:  Dec 23, 1965  PCP:  Ocie Doyne, NP      Chief Complaint:  High blood pressure    HPI:  [name] is a 55 y.o. White female who presents with hypertensive emergency to [facility].  Patient has known chronic kidney disease stage 5. I performed left upper extremity brachiocephalic fistula on 18/29/9371.  Unfortunately, patient needed dialysis this admission and the right IJ catheter was placed for dialysis.  The left upper extremity fistula was evaluated by ultrasound, the diameter of the fistula was adequate, however the depth of the fistula was in adequate to start dialysis.  Vascular surgery was consulted for evaluation for possible superficialization of the fistula.    Patient's main complaint is pain at the catheter insertion site in the right IJ.  Otherwise, patient denies chest pain, shortness of breath, abdominal pain, nausea, vomiting, fevers, chills, slurred speech, amaurosis fugax, weakness or numbness in 1 arm or 1 leg that comes and goes, intermittent claudication        ROS Other than ROS in the HPI, all other systems were negative.      Information Obtained from: patient    Past Medical History:    Past Medical History:   Diagnosis Date   . Anxiety    . Arthritis    . Cancer (CMS [place]) 2019, 2021    kidney   . Cellulitis 10/24/14    left foot   . 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 Depression    . Diabetes mellitus (CMS [place])    . Diabetes mellitus, type 2 (CMS HCC)    . Essential hypertension    . Fatty liver    . Headache(784.0)    . History of nephrectomy 03/24/2020    L nephrectomy   . Hyperlipidemia    . Hyperthyroidism    . Irritable bowel syndrome    . Macular edema    . Morbid obesity (CMS [place])    . Peripheral neuropathy      bilateral feet   . Type 2 diabetes mellitus (CMS HCC)    . Wears glasses    . White coat hypertension          Allergies   Allergen Reactions   . Vicodin [Hydrocodone-Acetaminophen] Itching         Past Surgical History:   Past Surgical History:   Procedure Laterality Date   . ECTOPIC PREGNANCY SURGERY  05/24/1998   . HX CATARACT REMOVAL Bilateral 2016   . HX CHOLECYSTECTOMY  09/10/96   . HX PELVIC LAPAROSCOPY  05/24/98    Ectopic pregnancy   . HX TUBAL LIGATION  09/10/2012    Filshie Clips   . HYMENECTOMY  1996   . KIDNEY SURGERY  2019, 2021   . LAPAROSCOPIC PARTIAL NEPHRECTOMY Left 10/24/2017    hand-assisted   . RENAL BIOPSY, PERCUTANEOUS  03/08/2020    clear cell renal cell carcinoma           Past Family History:   Family Medical History:     Problem Relation (Age of Onset)    Breast Cancer Paternal Aunt    Cancer Maternal Grandmother    Diabetes Father, Paternal Aunt    High Cholesterol  Maternal Uncle, Mother    Hypertension (High Blood Pressure) Maternal Uncle, Father          Social History:   Social History     Tobacco Use   . Smoking status: Never Smoker   . Smokeless tobacco: Never Used   Vaping Use   . Vaping Use: Never used   Substance Use Topics   . Alcohol use: No   . Drug use: No       Home Medications:   Medications Prior to Admission     Prescriptions    albuterol sulfate (PROVENTIL OR VENTOLIN OR PROAIR) 90 mcg/actuation Inhalation HFA Aerosol Inhaler    Take 2 Puffs by inhalation Every 4 hours as needed    ALPRAZolam (XANAX) 0.25 mg Oral Tablet    Take 0.25 mg by mouth    atorvastatin (LIPITOR) 20 mg Oral Tablet    Take 20 mg by mouth Every evening    escitalopram oxalate (LEXAPRO) 10 mg Oral Tablet    Take 10 mg by mouth Every night    Fish Oil-Omega-3 Fatty Acids 360-1,200 mg Oral Capsule    Take 2 Caps by mouth Twice daily    fluticasone (FLONASE) 50 mcg/actuation Nasal Spray, Suspension    1 Spray by Each Nostril route Once a day    JANUVIA 50 mg Oral Tablet    loperamide (IMODIUM) 2 mg Oral  Capsule    Take 2 mg by mouth Every 4 hours as needed    loratadine (CLARITIN) 10 mg Oral Tablet    Take 10 mg by mouth Once per day as needed     losartan (COZAAR) 25 mg Oral Tablet    Take 1 Tablet (25 mg total) by mouth Once a day for 30 days              Current Inpatient Medications:  acetaminophen (TYLENOL) tablet, 650 mg, Oral, Q6H PRN  albuterol (PROVENTIL) 2.5mg / 0.5 mL nebulizer solution, 2.5 mg, Nebulization, Q4H PRN   And  ipratropium (ATROVENT) 0.02% nebulizer solution, 0.5 mg, Nebulization, Q4H PRN  ALPRAZolam (XANAX) tablet, 0.25 mg, Oral, Daily PRN  atorvastatin (LIPITOR) tablet, 20 mg, Oral, QPM  calcitriol (ROCALTROL) capsule, 0.25 mcg, Oral, Daily  SSIP insulin lispro (HUMALOG) 100 units/mL SubQ pen, 1-5 Units, Subcutaneous, 4x/day AC   And  dextrose 50% (0.5 g/mL) injection - syringe, 12.5 g, Intravenous, Q15 Min PRN  escitalopram (LEXAPRO) tablet, 10 mg, Oral, NIGHTLY  heparin 5,000 unit/mL injection, 5,000 Units, Subcutaneous, Q8HRS  labetalol (NORMODYNE) tablet, 100 mg, Oral, 2x/day  lidocaine-prilocaine (EMLA) 2.5%-2.5% topical cream, , Apply Topically, Once  linagliptin (TRADJENTA) tablet, 5 mg, Oral, Daily  losartan (COZAAR) tablet, 25 mg, Oral, QPM  NS 250 mL flush bag, , Intravenous, Q1H PRN  NS flush syringe, 10 mL, Intravenous, Q8HRS  NS flush syringe, 10 mL, Intravenous, Q1H PRN  omega3-DHA-EPA-fish oil capsule, 1,000 mg, Oral, Daily  ondansetron (ZOFRAN) 2 mg/mL injection, 4 mg, Intravenous, Q6H PRN        OBJECTIVE:   Temperature: 36.8 C (98.2 F)  Heart Rate: 81  BP (Non-Invasive): (!) 131/49  Respiratory Rate: 19  SpO2: 91 %    Exam:     Constitutional:   BP (!) 131/49   Pulse 81   Temp 36.8 C (98.2 F)   Resp 19   Ht 1.7 m (5' 6.93")   Wt 129 kg (283 lb 15.2 oz)   LMP 10/24/2014 (Approximate)   SpO2 91%   BMI 44.57 kg/m  Appears in good health and no distress  Eyes:  Conjunctiva clear, eyelids normal.   Pupils equal, round, and reactive to light and  accomodation.  ENT:   Ears and nose have normal overall appearance with no lesions or masses.   Lips, teeth and gums have normal overall appearance  Neck:  Supple, symmetrical, trachea midline, no visible masses, no crepitus  Thyroid without visible enlargement, without palpable masses and non-tender  Respiratory:   Non-labored respirations, symmetrical chest expansion, no involvement of accessory muscles  Palpation of chest with symmetrical tactile fremitus, no tenderness and no crepitus   Cardiovascular:                Right radial artery: 2+ (normal), Left radial artery: 2+ (normal)  Left brachiocephalic fistula with excellent thrill. Incision well healed.  Abdomen:  Soft, non-tender, non-distended, no palpable masses  No palpable enlargement of liver or spleen  Neurologic:  Cranial nerves II - XII grossly intact  Strength 5/5 in all extremities  Sensation to light touch is intact and symmetrical to face, upper and lower extremities  Psychiatric:   Patient is oriented to time, place and person   Mood: Stable mood and affect    Lines& Drains  Patient Lines/Drains/Airways Status     Active Line / Dialysis Catheter / Dialysis Graft / Drain / Airway / Wound   / Colostomy / Ileostomy     Name Placement date Placement time Site Days Last dressing change    Peripheral IV Right;Inner Basilic  (medial side of arm) 10/17/20  1716  -   1     Dialysis Catheter Cuffed/Tunneled 10/18/20  1307  - less than 1               Labs:    Reviewed:  I have reviewed all lab results.    Radiology Tests:  Reviewed:   N/A    Assessment:   Active Hospital Problems    Diagnosis   . Fluid overload   . CKD (chronic kidney disease), stage V (CMS HCC)   . Essential hypertension       55 year old female with CKD stage 5, she was admitted for hypertensive urgency and started dialysis with right IJ tunneled catheter.  I had performed left upper extremity brachiocephalic fistula on 10/03/3233.  Ultrasound in patient demonstrated the fistula  to be too deep for dialysis initiation.    Plan:   I discussed with the patient indications, risks and benefits alternatives to superficialization of the left brachiocephalic fistula.  Specifically, I discussed the need for another 4-6 week healing period to allow for fistula scarring prior to initiation of dialysis.  I will plan to do the operation next Wednesday 10/27/20 on an outpatient basis.       Kaylyn Lim, MD 10/19/2020 10:00

## 2020-10-20 DIAGNOSIS — R0602 Shortness of breath: Secondary | ICD-10-CM | POA: Insufficient documentation

## 2020-10-20 DIAGNOSIS — E878 Other disorders of electrolyte and fluid balance, not elsewhere classified: Secondary | ICD-10-CM | POA: Insufficient documentation

## 2020-10-20 DIAGNOSIS — D631 Anemia in chronic kidney disease: Secondary | ICD-10-CM | POA: Insufficient documentation

## 2020-10-20 DIAGNOSIS — I209 Angina pectoris, unspecified: Secondary | ICD-10-CM | POA: Insufficient documentation

## 2020-10-20 DIAGNOSIS — N189 Chronic kidney disease, unspecified: Secondary | ICD-10-CM | POA: Insufficient documentation

## 2020-10-20 DIAGNOSIS — T782XXA Anaphylactic shock, unspecified, initial encounter: Secondary | ICD-10-CM | POA: Insufficient documentation

## 2020-10-20 DIAGNOSIS — T82898S Other specified complication of vascular prosthetic devices, implants and grafts, sequela: Secondary | ICD-10-CM | POA: Insufficient documentation

## 2020-10-20 DIAGNOSIS — R197 Diarrhea, unspecified: Secondary | ICD-10-CM | POA: Insufficient documentation

## 2020-10-20 DIAGNOSIS — B181 Chronic viral hepatitis B without delta-agent: Secondary | ICD-10-CM | POA: Insufficient documentation

## 2020-10-20 DIAGNOSIS — I10 Essential (primary) hypertension: Secondary | ICD-10-CM | POA: Insufficient documentation

## 2020-10-20 DIAGNOSIS — D509 Iron deficiency anemia, unspecified: Secondary | ICD-10-CM | POA: Insufficient documentation

## 2020-10-20 DIAGNOSIS — T7840XA Allergy, unspecified, initial encounter: Secondary | ICD-10-CM | POA: Insufficient documentation

## 2020-10-20 DIAGNOSIS — L299 Pruritus, unspecified: Secondary | ICD-10-CM | POA: Insufficient documentation

## 2020-10-20 DIAGNOSIS — E46 Unspecified protein-calorie malnutrition: Secondary | ICD-10-CM | POA: Insufficient documentation

## 2020-10-20 DIAGNOSIS — R519 Headache, unspecified: Secondary | ICD-10-CM | POA: Insufficient documentation

## 2020-10-20 DIAGNOSIS — R7989 Other specified abnormal findings of blood chemistry: Secondary | ICD-10-CM | POA: Insufficient documentation

## 2020-10-20 DIAGNOSIS — B182 Chronic viral hepatitis C: Secondary | ICD-10-CM | POA: Insufficient documentation

## 2020-10-20 LAB — POC FINGERSTICK GLUCOSE - BMC/JMC (RESULTS)
GLUCOSE, POC: 128 mg/dl — ABNORMAL HIGH (ref 60–100)
GLUCOSE, POC: 162 mg/dl — ABNORMAL HIGH (ref 60–100)

## 2020-10-20 MED ORDER — HEPARIN (PORCINE) 1,000 UNIT/ML INJECTION FOR DIALYSIS
3200.0000 [IU] | INTRAMUSCULAR | Status: AC
Start: 2020-10-20 — End: 2020-10-20
  Administered 2020-10-20: 3200 [IU]

## 2020-10-20 MED ORDER — LABETALOL 100 MG TABLET
50.0000 mg | ORAL_TABLET | Freq: Two times a day (BID) | ORAL | Status: DC
Start: 2020-10-20 — End: 2020-10-20

## 2020-10-20 MED ORDER — LABETALOL 100 MG TABLET
50.0000 mg | ORAL_TABLET | Freq: Two times a day (BID) | ORAL | 0 refills | Status: DC
Start: 2020-10-20 — End: 2021-05-26

## 2020-10-20 MED ORDER — HEPARIN (PORCINE) 1,000 UNIT/ML INJECTION FOR DIALYSIS
1500.0000 [IU] | INTRAMUSCULAR | Status: AC
Start: 2020-10-20 — End: 2020-10-20
  Administered 2020-10-20: 1500 [IU]

## 2020-10-20 NOTE — Progress Notes (Signed)
[facility]  Nephrology Consult Follow Up Note      Date of service: 10/20/2020  Hospital Day:  LOS: 3 days   Encounter Start Date: 10/16/2020  Inpatient Admission Date: 10/17/2020    SUBJECTIVE: Reported feeling very well this morning when I made rounds. She was seen by Dr. Jerrye Beavers yesterday and surgical plan established.     OBJECTIVE:    Vital Signs:  Temp (24hrs) Max:36.9 C (98.4 F)      Temperature: 36.9 C (98.4 F)  BP (Non-Invasive): (!) 172/53  MAP (Non-Invasive): 81 mmHG  Heart Rate: 81  Respiratory Rate: 16  SpO2: 95 %    Systolic (63KZS), WFU:932 , Min:132 , TFT:732     Diastolic (20URK), YHC:62, Min:53, Max:66    MAP:  MAP (Non-Invasive)  Avg: 92.2 mmHG  Min: 62 mmHG  Max: 135 mmHG  Heart Rate:  Pulse  Avg: 89.3  Min: 71  Max: 120    Dialysis treatment total fluid removal:  Treatment Total Fluid Removal: 2000 mL  Post dialysis weight:  Weight (Post Dialysis): 127 kg (279 lb 8.7 oz)    Diet Order:  DIET RENAL (75GM PROT;2GM K: 2GM NA) Food Preferences: DIABETIC  Nutrition:    Orders Placed This Encounter   No orders of the following type(s) were placed in this encounter: Nourishments.     IV Fluids, Meds, and Drips:      I/O:  I/O last 24 hours:      Intake/Output Summary (Last 24 hours) at 10/20/2020 1326  Last data filed at 10/20/2020 1000  Gross per 24 hour   Intake 490 ml   Output -   Net 490 ml     I/O current shift:  01/26 0700 - 01/26 1859  In: 240 [P.O.:240]  Out: -   I/O last 3 completed shifts:  In: 75 [P.O.:800; I.V.:10]  Out: -     Labs:  I have reviewed all lab results.    Imaging:  N/A    Current Medications:  acetaminophen (TYLENOL) tablet, 650 mg, Oral, Q6H PRN  albuterol (PROVENTIL) 2.5mg / 0.5 mL nebulizer solution, 2.5 mg, Nebulization, Q4H PRN   And  ipratropium (ATROVENT) 0.02% nebulizer solution, 0.5 mg, Nebulization, Q4H PRN  ALPRAZolam (XANAX) tablet, 0.25 mg, Oral, Daily PRN  atorvastatin (LIPITOR) tablet, 20 mg, Oral, QPM  calcitriol (ROCALTROL) capsule, 0.25 mcg, Oral,  Daily  SSIP insulin lispro (HUMALOG) 100 units/mL SubQ pen, 1-5 Units, Subcutaneous, 4x/day AC   And  dextrose 50% (0.5 g/mL) injection - syringe, 12.5 g, Intravenous, Q15 Min PRN  escitalopram (LEXAPRO) tablet, 10 mg, Oral, NIGHTLY  heparin 5,000 unit/mL injection, 5,000 Units, Subcutaneous, Q8HRS  labetalol (NORMODYNE) tablet, 50 mg, Oral, 2x/day  lidocaine-prilocaine (EMLA) 2.5%-2.5% topical cream, , Apply Topically, Once  linagliptin (TRADJENTA) tablet, 5 mg, Oral, Daily  losartan (COZAAR) tablet, 25 mg, Oral, QPM  NS 250 mL flush bag, , Intravenous, Q1H PRN  NS flush syringe, 10 mL, Intravenous, Q8HRS  NS flush syringe, 10 mL, Intravenous, Q1H PRN  omega3-DHA-EPA-fish oil capsule, 1,000 mg, Oral, Daily  ondansetron (ZOFRAN) 2 mg/mL injection, 4 mg, Intravenous, Q6H PRN  traMADol (ULTRAM) tablet, 25 mg, Oral, Q12H PRN        Physical Exam:  Constitutional:  appears in good health, moderately obese and no distress  Respiratory:  Clear to auscultation bilaterally.   Cardiovascular:  regular rate and rhythm  no rub  Gastrointestinal:  Soft, non-tender  Integumentary:  Skin warm and dry  Impression/Recommendations:  1. ESRD - HD today  2. HTN, reduced Labetalol to 50 mg bid, as BP will trend lower as we reduce her fluid/weight will dialysis. Hold Labetalol if BP too low. I prefer to continue current Losartan dose for now.   3. OK to discharge when  Outpatient dialysis arrangements confirmed.     Marijo Sanes, MD

## 2020-10-20 NOTE — Care Management Notes (Signed)
Provided patient with HD Welcome Letter; agreeable to plan.

## 2020-10-20 NOTE — Discharge Instructions (Signed)
HEMODIALYSIS     HD intake appointment Friday, 10/22/2020 at 2:15pm  Chair time: Monday, Wednesday, Friday at 3:00pm  Please telephone [facility] with any further questions / concerns.    [address]   [place], [place] [id number]  [phone]

## 2020-10-20 NOTE — Care Management Notes (Signed)
Received telephone call from [facility] with [facility].  She informed me insurance authorization and chair time remains pending.  She requests Hep B lab results faxed to her at [phone]; fax sent.

## 2020-10-20 NOTE — Nurses Notes (Signed)
"HEMODIALYSIS SUMMARY"  PATIENT: [name] MRN: [id number] ROOM: 635/B    Order  HEMODIALYSIS [DIACHI1] (Order [id number])    Shashana, [place]  Order #: [id number] Accession #: MRN: [id number]      General Information    No case/log ID found     Order Providers    Authorizing   Marijo Sanes, MD           Order Information    Date Department Ordering/Authorizing   10/20/2020 [facility] 86F Marijo Sanes, MD     Start Date/Time    Start Time   10/20/20 0645     Acknowledgement Info    For At Acknowledged By Acknowledged On   Placing Order 10/20/20 Newborn, [name], RN 10/20/20 6962     Released/Completed Orders    Released On Released By Completed On Completed By   10/20/20 9528 Marijo Sanes, MD (auto-released)               Standing Order Information    Remaining Occurrences Interval Last Released     0/1 ONE TIME 10/20/2020             Released Orders     Released On Order # Scheduled For Released By   1. 10/20/2020 6:42 AM [id number] 10/20/2020 6:45 AM Marijo Sanes, MD (auto-released)           Comments    Use CVC. Vascular scheduled AVF surgery revision on 10/27/20.   Heparin 500 unit/hour           Order Questions    Question Answer Comment   Reason for Treatment: End stage renal disease(CKD-Stage6)    Length of Dialysis (hrs): 3.5    Blood Flow rate (ml/min): 350    Size of fistula needle: N/A    Dialyzer: Nipro 15H    Fluid Removal (kg): 2.0 As tolerated   Dialysate flow rate (ml/min): Other    Other: 500    Acid Bath: 3 K / 2.5 Ca    Bicarb Settings: 38    Sodium Type: Linear    Sodium: 137    Calcium: 3.0    Type of access: Fistula    Dialysate temperature: 36 degrees Celsius            Additional Information    Associated Reports   View Encounter   Priority and Order Details     HEMODIALYSIS    Electronically signed by: Marijo Sanes, MD on 10/20/20 [phone]   Ordering user: Marijo Sanes, MD 10/20/20 [phone] Ordering provider: Marijo Sanes, MD   Authorized by: Marijo Sanes, MD Ordering mode:  Standard   Authorizing Provider NPI information    Marijo Sanes, MD                QIH#:[id number]                  [facility] Result Comments     Not Released  Not seen     Patient Release Status:     This result is automatically blocked from the patient.           Order Marine scientist Info     Order Set Info    Order Set Source   Did not come from Order set      Reprint Order    HEMODIALYSIS (Order [id number]) on 10/20/20  LABS:Results for [name], May (MRN [id number]) as of 10/20/2020 11:28   Ref. Range 10/19/2020 05:41   WBC Latest Ref Range: 3.7 - 11.0 x10^3/uL 5.6   HGB Latest Ref Range: 11.5 - 16.0 g/dL 8.9 (L)   HCT Latest Ref Range: 34.8 - 46.0 % 28.8 (L)   PLATELET COUNT Latest Ref Range: 150 - 400 x10^3/uL 201   RBC Latest Ref Range: 3.85 - 5.22 x10^6/uL 3.15 (L)   MCV Latest Ref Range: 78.0 - 100.0 fL 91.4   MCHC Latest Ref Range: 31.0 - 35.5 g/dL 30.9 (L)   MCH Latest Ref Range: 26.0 - 32.0 pg 28.3   RDW-CV Latest Ref Range: 11.5 - 15.5 % 13.2   MPV Latest Ref Range: 8.7 - 12.5 fL 11.4   PMN'S Latest Units: % 66   LYMPHOCYTES Latest Units: % 23   EOSINOPHIL Latest Units: % 1   MONOCYTES Latest Units: % 10   BASOPHILS Latest Units: % 0   IMMATURE GRANULOCYTE % Latest Ref Range: 0 - 1 % 0   IMMATURE GRANULOCYTE # Latest Ref Range: <0.10 x10^3/uL <0.10   PMN ABS Latest Ref Range: 1.50 - 7.70 x10^3/uL 3.72   LYMPHS ABS Latest Ref Range: 1.00 - 4.80 x10^3/uL 1.27   EOS ABS Latest Ref Range: <=0.50 x10^3/uL <0.10   MONOS ABS Latest Ref Range: 0.20 - 1.10 x10^3/uL 0.55   BASOS ABS Latest Ref Range: <=0.20 x10^3/uL <0.10   SODIUM Latest Ref Range: 136 - 145 mmol/L 138   POTASSIUM Latest Ref Range: 3.5 - 5.1 mmol/L 4.0   CHLORIDE Latest Ref Range: 96 - 111 mmol/L 102   CARBON DIOXIDE Latest Ref Range: 22 - 30 mmol/L 25   BUN Latest Ref Range: 8 - 25 mg/dL 31 (H)   CREATININE Latest Ref Range: 0.60 - 1.05 mg/dL 3.68 (H)   GLUCOSE  Latest Ref Range: 65 - 125 mg/dL 133 (H)   ANION GAP Latest Ref Range: 4 - 13 mmol/L 11   BUN/CREAT RATIO Latest Ref Range: 6 - 22  8   ESTIMATED GLOMERULAR FILTRATION RATE Latest Ref Range: >=60 mL/min/BSA 13 (L)   CALCIUM Latest Ref Range: 8.5 - 10.0 mg/dL 8.4 (L)   MAGNESIUM Latest Ref Range: 1.8 - 2.6 mg/dL 1.7 (L)   PHOSPHORUS Latest Ref Range: 2.4 - 4.7 mg/dL 5.1 (H)   ALBUMIN Latest Ref Range: 3.5 - 5.0 g/dL  2.6 (L)     CONSENT SIGNED:  YES  BLOOD CONSENT:      N/A:  YES   DX:   Patient Active Problem List   Diagnosis   . Diabetic foot infection (CMS [place])   . Type 2 diabetes mellitus (CMS HCC)   . Cellulitis of foot, left   . Essential hypertension   . Obesity   . C. difficile diarrhea   . Hyperkalemia   . AKI (acute kidney injury) (CMS HCC)   . Slow transit constipation   . 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 Morbid obesity   . Persistent vomiting   . CKD (chronic kidney disease), stage V (CMS HCC)   . Fluid overload     DIABETIC:  YES  CODE STATUS:  FULL CODE  ALLERGIES:   Allergies   Allergen Reactions   . Vicodin [Hydrocodone-Acetaminophen] Itching     DIET: DIET RENAL (75GM PROT;2GM K: 2GM NA) Food Preferences: DIABETIC  ACCESS:   RPC   NEEDLE SIZE:   N/A  WT:  128.8 KG   EDW: N/A      UFG:2L AS TOLERATED  PRE TX NURSE [facility] AT 0815  TIME OUT TIME/SAFETY CHECKS:  9242  HEPATITIS STATUS:  HBSAG   DATE:   01/ 23/22    RESULTS:  NEGATIVE  HBSAB   DATE:  10/17/20     RESULTS:  <10  MOBILITY: BED  YES  STRETCHER  1ST TIME ACUTE:      STAT:     ROUTINE:    YES   URGENT:  ACUTE ROOM:   YES    BEDSIDE:     ICU:  ISOLATION PRECAUTION:  DIALYSIS:  YES     [place]:     CONTACT:      REVERSE:      DROPLET:  SPECIAL CONSIDERATIONS:  NONE   TOTAL CHLORINE < 0.1PPM  TIME:     0840    2ND CHECK TIME:  1240  TREATMENT INITIATION:  0940  BATH K/CA:  3/3.0       DFR: 500  ISOLATED UF ONLY:  NO  NA:   137   HCO3: 38  INCAPACITATED NURSE EDUCATION COMPLETED:       MULTI-SUITE: YES       NA:  MD NOTIFIED AT THE START:  DR. [facility]       10/20/20 0915   Machine Checks   Safety Checks P   Machine Number 3   Machine Temp 36.5 C (97.7 F)   Alarms Set Yes (Pre-Set)   Conductivity 13.8   Independent Conductivity 13.6   [place] -   Independent pH 7.2   Dialysate Used: premixed hemodialysate (NATURALYTE) 3.43 L;with potassium chloride 3 mEq/L;with calcium chloride 3 mEq/L   Dialyzer Used:   (N15-H)   Med Being Verified DIALYSATE,HEPARIN,HEPLOCK   Med Verification (1st) Clinician Verification of Medication/Blood   Additional Pre- Dialysis Documentation   Hepatitis B Status Checked: Negative Surface Antigen   Hepatitis B Date Drawn 10/17/20   Pre-Dialysis Safety Checks Yes   Total Chlorine LESS THAN 0.1   Dialysis Prescription   Order Confirmed Yes   Pre Dialysis Vitals   Weight (Pre Dialysis) 129 kg (283 lb 15.2 oz)   Weight Source Bed   Temp 36.7 C (98.1 F)   BP (Non-Invasive) (!) 172/53   BP Source (Non-Invasive) C   Heart Rate 81   SpO2 95 %  (ON ROOM AIR)   Pre Dialysis Assessment   Patient Status Report Received;Stable   Cardiac Pulse Regular   Respiratory Lungs Clear   Mental Status Alert   GI Complaints No Complaint   Fluid Assessment Lower Extremity Edema   Lower Extremity Edema Status   (TRACE)   Pain Score 0   Patient Report Received FromCristela Blue [facility] RN AT 6834   Vascular Access   Was Catheter Placed in Dialysis? No   Access Site Right Chest   Access Type Tunneled Cuffed Catheter   Access Assessment WDL   Catheter Lumen Pre Dialysis Assessment arterial lumen blood return present;venous lumen blood return present   Access Patent Yes   Dialysis Consent   Consent Obtained Written   Date Consent Obtained 10/18/20   Procedure Time Out   Consent Obtained Written   Patient Identification (name and date of birth): Patient Verbally Identified Self;Verified Patient Identification Band   Patient Position Semi-fowlers (less than 30 degrees)   Procedure Verified? Yes   Time Out Performed By Susa Day  RN AT [phone]  Dialysis Catheter Cuffed/Tunneled   Placement Date/Time: 10/18/20 1307   Pt Loc (Unit/Rm): IR lab  Inserted: (c) In Interventional Radiology;By Physician  Access Site: Right Internal Jugular  Dialysis Catheter Type: Cuffed/Tunneled   Line Status flushed without difficulty;intermittent infusion cap intact   Distal Injection Lumen Status flushed without difficulty;blood return present   SITE ASSESSMENT WDL   DRESSING TYPE 2X2 Gauze;Transparent   DRESSING STATUS Changed/ New  (USED CHLORHEXEDINE DRESSING)   DRESSING DRAINAGE Scant   Evaluation of Need Hemodialysis   Daily Site Maintenance & Management Met   Dressing Change Interventions Met   Date Dressing Changed 10/20/20        10/20/20 0940   Hemodialysis Monitor   Patient Status Stable   Time On 0940  (TREATMENT STARTED)   Vitals and Machine Parameters   BP 179/78   Pulse 84   SpO2 95 %   Blood Flowrate 350 ML/MIN   Arterial Pressure -150 mmHg   Venous Pressure 120 mmHg   Transmembrane Pressure (TMP) 50 mmHg   Ultrafiltration Rate (Dialysis) 710   Fluids Normal Saline   IV Fluid Administered Due To:   (PRIME)   Fluid Volume 250 mL   Fluid Goal 2 Kg   Actual Fluid Goal 2.5 Kg   Patient Observed Yes  (PT PLEASANT,VERBALIZING FEELS BETTER.POC EXPLAIN,VERBALIZES UNDERSTANDING)   Additional Hemodialysis Documentation   [place] On Yes   Dialysis Access Visible Yes   Hemodialysis Line Secure Yes   Machine Serial Number [id number]   RO Serial Number MAIN   Alarm Pass Time 0842   Dialyzer Lot# [id number]   [place] Lot# [id number]   RO/Machine Log Complete Yes   All Connections Secured Yes   Venous/Arterial parameters set Yes   Prime Given 250   Air Foam detector engages  Yes   Saline Line double clamped  Yes   Incapacitated Nurse Education Completed  Yes        10/20/20 0945 10/20/20 1000 10/20/20 1015   Hemodialysis Monitor   Patient Status Stable Stable Stable   Vitals and Machine Parameters   BP 172/74 163/79 163/73   Pulse 80 82 84   Blood Flowrate   --  350 ML/MIN  --    Arterial Pressure  --  -140 mmHg  --    Venous Pressure  --  120 mmHg  --    Transmembrane Pressure (TMP)  --  50 mmHg  --    Ultrafiltration Rate (Dialysis)  --  720  --    Actual Fluid Goal 2.5 Kg 2.5 Kg 2.5 Kg   Actual Fluid Removal 70 mL 240 mL 616 mL   Patient Observed Yes Yes  (PT TRYING TO SLEEP) Yes   Additional Hemodialysis Documentation   [place] On Yes Yes Yes   Dialysis Access Visible Yes Yes Yes   Hemodialysis Line Secure Yes Yes Yes        10/20/20 1030 10/20/20 1045 10/20/20 1100   Hemodialysis Monitor   Patient Status Stable Stable Stable   Vitals and Machine Parameters   BP 183/86 172/88 (!) 187/98   Pulse 78 78 83   Blood Flowrate 350 ML/MIN  --  350 ML/MIN   Arterial Pressure -160 mmHg  --  -150 mmHg   Venous Pressure 130 mmHg  --  130 mmHg   Transmembrane Pressure (TMP) 50 mmHg  --  50 mmHg   Ultrafiltration Rate (Dialysis) 720  --  720   Actual Fluid Goal 2.5 Kg 2.5 Kg 2.5  Kg   Actual Fluid Removal 760 mL 843 mL 960 mL   Patient Observed Yes  (PT ON HER PHONE) Yes Yes  (ON HER PHONE)   Additional Hemodialysis Documentation   [place] On Yes Yes Yes   Dialysis Access Visible Yes Yes Yes   Hemodialysis Line Secure Yes Yes Yes        10/20/20 1115 10/20/20 1130 10/20/20 1145   Hemodialysis Monitor   Patient Status Stable Stable Stable   Vitals and Machine Parameters   BP (!) 178/93 (!) 174/92 (!) 179/93   Pulse 81 81 78   Blood Flowrate  --  250 ML/MIN  (ADJUSTED AS ARTERIAL CHAMBER WITH LOTS OF BUBBLES)  --    Arterial Pressure  --  -100 mmHg  --    Venous Pressure  --  80 mmHg  --    Transmembrane Pressure (TMP)  --  60 mmHg  --    Ultrafiltration Rate (Dialysis)  --  720  --    Actual Fluid Goal 2.5 Kg 2.5 Kg 2.5 Kg   Actual Fluid Removal 1120 mL 1298 mL 1470 mL   Patient Observed Yes Yes  (PT ON HER PHONE) Yes   Additional Hemodialysis Documentation   [place] On Yes Yes Yes   Dialysis Access Visible Yes Yes Yes   Hemodialysis Line Secure Yes Yes Yes        10/20/20 1200  10/20/20 1215 10/20/20 1230   Hemodialysis Monitor   Patient Status Stable Stable Stable   Vitals and Machine Parameters   BP (!) 193/104  --  163/88   Pulse 81  --  85   Blood Flowrate 250 ML/MIN  --  350 ML/MIN  (LINE REVERSED)   Arterial Pressure -90 mmHg  --  -160 mmHg   Venous Pressure 70 mmHg  --  120 mmHg   Transmembrane Pressure (TMP) 60 mmHg  --  50 mmHg   Ultrafiltration Rate (Dialysis) 720  --  730   Actual Fluid Goal 2.5 Kg  --  2.5 Kg   Actual Fluid Removal 1662 mL  --  1970 mL   Patient Observed Yes  (ON HER PHONE)  --  Yes  (SLEEPING)   Additional Hemodialysis Documentation   [place] On Yes  --  Yes   Dialysis Access Visible Yes  --  Yes   Hemodialysis Line Secure Yes  --  Yes        10/20/20 1245 10/20/20 1300 10/20/20 1315   Hemodialysis Monitor   Patient Status Stable Stable Stable  (TREATMENT ENDED)   Vitals and Machine Parameters   BP (!) 171/94 (!) 175/100 (!) 175/101   Pulse 85 83 80   SpO2  --   --  100 %  (ON ROOM AIR)   Kt/V  --   --  1.36   Blood Flowrate  --  350 ML/MIN 350 ML/MIN   Arterial Pressure  --  -160 mmHg -160 mmHg   Venous Pressure  --  120 mmHg 120 mmHg   Transmembrane Pressure (TMP)  --  50 mmHg 50 mmHg   Ultrafiltration Rate (Dialysis)  --  730 730   Fluids  --   --  Normal Saline   IV Fluid Administered Due To:  --   --  Standard Rinseback   Fluid Volume  --   --  250 mL   Fluid Goal  --   --  2 Kg   Actual Fluid Goal 2.5 Kg 2.5 Kg 2.5  Kg   Actual Fluid Removal 2149 mL 2352 mL 2500 mL   Treatment Total Fluid Removal  --   --  2000 mL   Patient Observed Yes Yes  (AWAKE TALKING TO HOSPITALIST) Yes  (TOLERATED AND COMPLETED TREATMENT WELL)   Additional Hemodialysis Documentation   [place] On Yes Yes Yes   Dialysis Access Visible Yes Yes Yes   Hemodialysis Line Secure Yes Yes Yes        10/20/20 1322   Post Dialysis Vitals   Weight (Post Dialysis) 127 kg (279 lb 8.7 oz)   Weight 129 kg (283 lb 15.2 oz)   Weight Source Bed   Temp 36.8 C (98.2 F)   Temp Source Axillary   BP  (!) 182/101   BP Source (Non-Invasive) C;RA   Pulse 87   RR 16   End Treatment Kt/V 1.36   Post Dialysis Assessment   Time Off 1315   $ Treatment Completed Yes   Cardiac Pulse Regular   Respiratory Lungs Clear   Arterial Catheter Lumen flushed without difficulty;intermittent infusion cap applied   Arterial Lumen Volume 1.6 mL   Arterial Lumen Flushed 10cc of 0.9 NS;Heparin   Venous Catheter Lumen flushed without difficulty;intermittent infusion cap applied   Venous Lumen Volume 1.6 mL   Venous Lumen Flushed 10cc of 0.9 NS;Heparin   Dialyzer Appearance Streaked <50%   Dialysis Outcome Goal Met   Post Hemodialysis Patient Status Report Given;Stable         MEDICATIONS/BLOOD PRODUCTS:      heparin 1,000 units/mL injection for dialysis  [[id number]]    Ordered Dose: 1,500 Units Route: Intracatheter Frequency: GIVE IN DIALYSIS   Admin Dose: 1,500 Units      Scheduled Start Date/Time: 10/20/20 0955 End Date/Time: 10/20/20 1043 after 1 doses First Dose: As Scheduled      Admin Instructions:   Heparin 500 units x 3 hrs during dialysis treatment   CAUTION: "High Alert" Medication.           Order Status: Completed Wed Oct 20, 2020 1043, originally scheduled to end    Ordering User: Sula Soda, [place], RN Ordering Date/Time: Wed Oct 20, 2020 0955   Ordering Provider: Marijo Sanes, MD Authorizing Provider: Marijo Sanes, MD         Order part of Order Set:  [facility]: HEMODIALYSIS       Start Date/Time    Start Time   10/20/20 [phone]     Priority and Order Details    Priority Order Status Class     Routine Completed E-Rx           EDUCATION: Patient:  YES   Others:  Knowledge Base:  Substantial:   Minimal:   YES  None:  Barriers to Learning:         None:  YES    Teaching Method:   Oral:  YES  Written:  Visual:    Hands On/ Demo:  Topic:  Access Care:   YES  &S of Infection: YES    Fluid Management:   K+: YES   Procedural:  YES  Albumin:   Medications:  Tx Options:   Transplant:    Diet:   Others:  Patient Response:  Verbalized  Understanding: YES    Demonstrate Understanding:   Teach Back:    Return Demo:   No Evidence for Learning:  Requires Follow Up:   YES None/N/A:    POST TX NOTES:  PATIENT TOLERATED AND COMPLETED TREATMENT  WELL. BFR ADJUSTED AS NEEDED AND LINE WAS REVERSED TO MAINTAIN OD 350.    POST TX ACCESS:    CATHETER LOCKING SOLUTION:  HEPARIN 1000 UNITS/ML:  3200 UNITS       SALINE FLUSH:   10 MLS EACH                                                                   VOLUME ART__1.6__ML    VEN_1.6___ML                                                                  CLAMPED AND CAPPED:  YES   ACCESS FLOW: Good: YES   Poor:   Positional:   Reversed:   Clotted:  BVP_63.4___L  KT/V__1.36__  NET UF_2000___ML  PRIMARY NURSE REPORT POST TX:   MAURA ROGICH  _RN   TIME:  1324 9SECURED TEXT MESSAGED SENT)

## 2020-10-20 NOTE — Care Plan (Signed)
Problem: Adult Inpatient Plan of Care  Goal: Plan of Care Review  Outcome: Ongoing (see interventions/notes)  Goal: Patient-Specific Goal (Individualized)  Outcome: Ongoing (see interventions/notes)  Goal: Absence of Hospital-Acquired Illness or Injury  Outcome: Ongoing (see interventions/notes)  Goal: Optimal Comfort and Wellbeing  Outcome: Ongoing (see interventions/notes)  Goal: Rounds/Family Conference  Outcome: Ongoing (see interventions/notes)     Problem: Fall Injury Risk  Goal: Absence of Fall and Fall-Related Injury  Outcome: Ongoing (see interventions/notes)

## 2020-10-20 NOTE — Progress Notes (Incomplete)
[facility]  HOSPITALIST INPATIENT PROGRESS NOTE      Srinidhi, Landers  Date of Birth:  05-26-1966     PCP: Ocie Doyne, NP    Date of Admission:  10/16/2020    Date of Service:  10/20/2020    Chief Complaint: SOB    Hospital Course:  PMH - obesity, DM, HTN, CKD5      SUBJECTIVE     ***            ASSESSMENT & PLAN   Problem List:  Active Hospital Problems   (*Primary Problem)    Diagnosis   . Fluid overload   . CKD (chronic kidney disease), stage V (CMS HCC)     Added automatically from request for surgery [id number]     . Essential hypertension     Chronic     Not at goal. Losartan increased already. Will add Amlodipine 5. Goal <120/80           1. Fluid overload 2/2 End stage kidney disease  2. Newly started on HD  - LUE AVF 11/17 with positive bruit  - Nephrology on board  - Losartan 25 mg every evening  - Monitor I/O  - Daily weight  - Telemetry  - Started HD through [facility]  - Amlodipine discontinued  - Labetalol 100 mg bid PO  - Vascular will require superficialization of left AVF which will be done outpatient on Wednesday 10/27/2020    2. Type 2 NSTEMI  - Troponins are stable and expected to be elevated with kidney disease  - Echocardiogram 55-60%, small mobile density on aortic valve  - no S&S to suggest IE, will refer to cards outpatient to decide on TEE.    3. 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 Uncontrolled T2DM: A1C 8.1. Continue DPP. SSI  5. Hyperparathyroidism due CKD: c/w calcitriol    DVT Prophylaxis: Heparin SQ  Code status: Full Code  Disposition: TBD    Creta Levin, MD   [facility]          OBJECTIVE     Vital Signs:  Temp (24hrs) Max:36.9 C (98.4 F)      Temperature: 36.9 C (98.4 F)  BP (Non-Invasive): 132/66  MAP (Non-Invasive): 81 mmHG  Heart Rate: 88  Respiratory Rate: 16  SpO2: 91 %      Objective     Current Medications:  acetaminophen (TYLENOL) tablet, 650 mg, Oral, Q6H PRN  albuterol (PROVENTIL) 2.5mg / 0.5 mL nebulizer solution, 2.5 mg,  Nebulization, Q4H PRN   And  ipratropium (ATROVENT) 0.02% nebulizer solution, 0.5 mg, Nebulization, Q4H PRN  ALPRAZolam (XANAX) tablet, 0.25 mg, Oral, Daily PRN  atorvastatin (LIPITOR) tablet, 20 mg, Oral, QPM  calcitriol (ROCALTROL) capsule, 0.25 mcg, Oral, Daily  SSIP insulin lispro (HUMALOG) 100 units/mL SubQ pen, 1-5 Units, Subcutaneous, 4x/day AC   And  dextrose 50% (0.5 g/mL) injection - syringe, 12.5 g, Intravenous, Q15 Min PRN  escitalopram (LEXAPRO) tablet, 10 mg, Oral, NIGHTLY  heparin 5,000 unit/mL injection, 5,000 Units, Subcutaneous, Q8HRS  labetalol (NORMODYNE) tablet, 50 mg, Oral, 2x/day  lidocaine-prilocaine (EMLA) 2.5%-2.5% topical cream, , Apply Topically, Once  linagliptin (TRADJENTA) tablet, 5 mg, Oral, Daily  losartan (COZAAR) tablet, 25 mg, Oral, QPM  NS 250 mL flush bag, , Intravenous, Q1H PRN  NS flush syringe, 10 mL, Intravenous, Q8HRS  NS flush syringe, 10 mL, Intravenous, Q1H PRN  omega3-DHA-EPA-fish oil capsule, 1,000 mg, Oral, Daily  ondansetron (ZOFRAN) 2 mg/mL injection,  4 mg, Intravenous, Q6H PRN  traMADol (ULTRAM) tablet, 25 mg, Oral, Q12H PRN        Today's Physical Exam:  General: appears in good health. No distress.   Eyes: Pupils equal and round, reactive to light and accomodation.   HENT:Head atraumatic and normocephalic   Neck: No JVD or thyromegaly or lymphadenopathy   Lungs: CTAB, non labored breathing, no rales or wheezing.    Cardiovascular: regular rate and rhythm, S1, S2 normal, no murmur,   Abdomen: Soft, non-tender, Bowel sounds normal, No hepatosplenomegaly   Extremities: extremities normal, atraumatic, no cyanosis or edema   Skin: Skin warm and dry   Neurologic: Grossly normal   Psychiatric: Normal affect, behavior,         I/O:  I/O last 24 hours:      Intake/Output Summary (Last 24 hours) at 10/20/2020 0952  Last data filed at 10/19/2020 1900  Gross per 24 hour   Intake 810 ml   Output -   Net 810 ml     I/O current shift:  No intake/output data recorded.      Labs   Please indicate ordered or reviewed)  Reviewed: I have reviewed all lab results.        Radiology Tests (Please indicate ordered or reviewed)  Reviewed: personally reviewed all available images    Medical decision making:  All pertinent labs were personally reviewed with additional follow-up lab work ordered as deemed clinically appropriate.  All available imaging results and EKGs were reviewed and independently interpreted.  Pertinent previous medical record results were reviewed.  Further workup and treatment depending on clinical cours            Portions of this note may be dictated using voice recognition software or a dictation service. Variances in spelling and vocabulary are possible and unintentional. Not all errors are caught/corrected. Please notify the [facility] if any discrepancies are noted or if the meaning of any statement is not clear.

## 2020-10-20 NOTE — Care Management Notes (Signed)
Welcome Letter obtained; will provide copy to patient.

## 2020-10-20 NOTE — Care Management Notes (Signed)
Received telephone call from [place], [facility] liaison.  He confirms patient's HD arrangements are complete with financial clearance obtained.    Patient's start of care is Friday, 10/22/2020 at 1415; location [facility].      Chair time: M,W,F at 1500.     Telephoned [facility] and spoke with Jocelyn Lamer to request Welcome Letter; will fax to CM.

## 2020-10-20 NOTE — Care Management Notes (Signed)
Update to Dr. Algis Downs.      CM Consult complete.    10/18/20 1239  IP CONSULT TO CARE MANAGEMENT ([facility] - DO NOT USE FOR [facility] PATIENTS)  ONE TIME     Complete  Discontinue     Comments: Starting HD today. Hepatitis serology results in Epic.   Process Instructions: If requesting assistance with completion of Advance Directives, please provide patient with the packet.   References:    ON CALL ([place])   Provider: (Not yet assigned)   Question: Indication For Consult: Answer: NEW ESRD PATIENT

## 2020-10-20 NOTE — Care Management Notes (Signed)
Telephoned [name]., Patient Matters to request meeting with patient if time allows.

## 2020-10-21 ENCOUNTER — Ambulatory Visit
Admission: RE | Admit: 2020-10-21 | Discharge: 2020-10-21 | Disposition: A | Discharge status: Home / Self Care | Payer: 59 | Source: Ambulatory Visit | Attending: VASCULAR SURGERY | Admitting: VASCULAR SURGERY

## 2020-10-21 ENCOUNTER — Other Ambulatory Visit: Payer: Self-pay

## 2020-10-21 ENCOUNTER — Encounter (HOSPITAL_BASED_OUTPATIENT_CLINIC_OR_DEPARTMENT_OTHER): Payer: Self-pay

## 2020-10-21 DIAGNOSIS — C801 Malignant (primary) neoplasm, unspecified: Secondary | ICD-10-CM

## 2020-10-21 DIAGNOSIS — Z87448 Personal history of other diseases of urinary system: Secondary | ICD-10-CM

## 2020-10-21 HISTORY — DX: Shortness of breath: R06.02

## 2020-10-21 HISTORY — DX: Pneumonia, unspecified organism: J18.9

## 2020-10-21 HISTORY — DX: Other chronic pain: G89.29

## 2020-10-21 HISTORY — DX: Constipation, unspecified: K59.00

## 2020-10-21 HISTORY — DX: Hyperparathyroidism, unspecified: E21.3

## 2020-10-21 HISTORY — DX: Diarrhea, unspecified: R19.7

## 2020-10-21 HISTORY — DX: Panic disorder (episodic paroxysmal anxiety): F41.0

## 2020-10-21 HISTORY — DX: Dyspnea, unspecified: R06.00

## 2020-10-21 HISTORY — DX: Other long term (current) drug therapy: Z79.899

## 2020-10-21 HISTORY — DX: Localized edema: R60.0

## 2020-10-21 HISTORY — DX: Edema, unspecified: R60.9

## 2020-10-21 HISTORY — DX: Other general symptoms and signs: R68.89

## 2020-10-21 HISTORY — DX: Other long term (current) drug therapy: I51.9

## 2020-10-21 HISTORY — DX: Dependence on renal dialysis: Z99.2

## 2020-10-21 HISTORY — DX: Unspecified cataract: H26.9

## 2020-10-21 HISTORY — DX: Fluid overload, unspecified: E87.70

## 2020-10-21 HISTORY — DX: Other forms of dyspnea: R06.09

## 2020-10-21 HISTORY — DX: Personal history of other diseases of urinary system: Z87.448

## 2020-10-21 NOTE — Discharge Instructions (Signed)
Patient interviewed by phone on 10/21/20, instructions given to patient:    Nothing to eat or drink after midnight the day of surgery except a sip of water with Labetalol and may use Inhaler am of OR as instructed and bring Inhaler to hospital with you;     Please contact your provider if you are experiencing cold or flu-like symptoms and/or any signs of infection prior to your procedure.    COVID-19 guidelines reviewed with patient;

## 2020-10-21 NOTE — Care Management Notes (Signed)
Discharge confirmation; complete.

## 2020-10-21 NOTE — Nurses Notes (Signed)
Patient is asymptomatic at this time for any of the eight COVID-19 symptoms. Patient has been instructed to quarantine for seven days if possible, but must perform strict isolation for at least 48 hours prior to procedure. Patient will take temperature 2 times a day, if they have a thermometer, and bring the log with them day of surgery. To start taking temperature on 10/21/20;

## 2020-10-24 NOTE — Discharge Summary (Signed)
[facility]    DISCHARGE SUMMARY      PATIENT NAME:  [name], [name]  MRN:  [id number]  DOB:  [birth year]    ADMISSION DATE:  10/16/2020  DISCHARGE DATE:  10/20/20    ATTENDING PHYSICIAN: Creta Levin, MD  PRIMARY CARE PHYSICIAN: Ocie Doyne, NP     ADMISSION DIAGNOSIS: ESRD requiring new HD  DISCHARGE DIAGNOSIS:   Active Hospital Problems    Diagnosis Date Noted   . Fluid overload [E87.70] 10/16/2020   . CKD (chronic kidney disease), stage V (CMS HCC) [N18.5] 08/03/2020   . Essential hypertension [I10] 11/30/2014      Resolved Hospital Problems   No resolved problems to display.     Active Non-Hospital Problems    Diagnosis Date Noted   . History of kidney disease    . Cancer (CMS [place])    . Persistent vomiting 08/20/2019   . Morbid obesity 10/24/2017   . 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 Hyperkalemia 10/08/2017   . AKI (acute kidney injury) (CMS [place]) 10/08/2017   . Slow transit constipation 10/08/2017   . C. difficile diarrhea 12/02/2014   . Obesity 11/30/2014   . Cellulitis of foot, left 11/29/2014   . Type 2 diabetes mellitus (CMS [place]) 10/25/2014   . Diabetic foot infection (CMS [place]) 10/24/2014      DISCHARGE MEDICATIONS:     Current Discharge Medication List      START taking these medications.      Details   labetaloL 100 mg Tablet  Commonly known as: NORMODYNE   50 mg, Oral, 2 TIMES DAILY  Qty: 14 Tablet  Refills: 0        CONTINUE these medications - NO CHANGES were made during your visit.      Details   albuterol sulfate 90 mcg/actuation HFA Aerosol Inhaler  Commonly known as: PROVENTIL or VENTOLIN or PROAIR   2 Puffs, Inhalation, EVERY 4 HOURS PRN  Qty: 2 Inhaler  Refills: 0     ALPRAZolam 0.25 mg Tablet  Commonly known as: XANAX   0.25 mg, Oral, DAILY PRN  Refills: 0     atorvastatin 20 mg Tablet  Commonly known as: LIPITOR   20 mg, Oral, EVERY EVENING  Refills: 0     escitalopram oxalate 10 mg Tablet  Commonly known as: LEXAPRO   10 mg, Oral, NIGHTLY  Refills: 0      Fish Oil-Omega-3 Fatty Acids 360-1,200 mg Capsule   2 Capsules, Oral, 2 TIMES DAILY  Refills: 0     fluticasone propionate 50 mcg/actuation Spray, Suspension  Commonly known as: FLONASE   1 Spray, Each Nostril, DAILY PRN  Refills: 0     Januvia 50 mg Tablet  Generic drug: SITagliptin   DAILY  Refills: 0     loperamide 2 mg Capsule  Commonly known as: IMODIUM   2 mg, Oral, EVERY 4 HOURS PRN  Refills: 0     loratadine 10 mg Tablet  Commonly known as: CLARITIN   10 mg, Oral  Refills: 0     losartan 25 mg Tablet  Commonly known as: COZAAR   25 mg, Oral, DAILY  Qty: 30 Tablet  Refills: 0          DISCHARGE INSTRUCTIONS:      DISCHARGE INSTRUCTION - DIET     Diet: RENAL DIET      ASPIRIN NOT ORDERED AT THIS TIME      Follow-up Information  Ocie Doyne, NP In 2 weeks.    Specialty: EXTERNAL  Why: Discharge follow up  Contact information:  [place] [address]  [phone]             Julian Hy, MD In 3 weeks.    Specialties: INTERNAL MEDICINE - CARDIOVASCULAR DISEASE, CARDIOLOGY  Why: Mobile density on aortic valve  Contact information:  [address]  [place] [address]  [phone]                            REASON FOR HOSPITALIZATION AND HOSPITAL COURSE:  This is a 55 y.o., female with PMH of HTN, CKD 5, DM presented with SOB and was found to be in fluid overlaod 2/2 worsening CKD. Started on HD with improvement, OP dialysis set up. Other c/c issues controlled, Discharged to f/u with vascular for superficialization of left AVF, continuing HD through cath currently. Will f/u with nephrology as well.    Objective     Physical exam :     BP (!) 144/61   Pulse 81   Temp 36.7 C (98.1 F)   Resp 19   Ht 1.7 m (5' 6.93")   Wt 129 kg (283 lb 15.2 oz)   LMP 10/24/2014 (Approximate)   SpO2 96%   BMI 44.57 kg/m         General Appearance: No acute distress  HEENT: Normocephalic. PERRL. No nasal discharge. Neck Supple.   Cardiac: Normal rate and rhythm,  No murmurs appreciated, There is no peripheral edema or cyanosis.   Lung: Clear to auscultation without rales, rhonchi, or wheezing.   Abdomen: Positive bowel sounds. Soft, Non-distended, Non-tender, No guarding or rebound. No masses.   Musculoskeletal: No joint deformity, erythema or tenderness. Full ROM  Neurological: Normal motor, sensory, and mental status on exam. Reflexes 2+ throughout.   Skin: Normal color, intact, no rashes.  Psychiatric: Awake, alert and cooperative. Normal affect.     No results found for this or any previous visit (from the past 24 hour(s)).    XR CHEST PA AND LATERAL    Result Date: 10/16/2020  PROCEDURE DESCRIPTION: XR CHEST PA AND LATERAL CLINICAL INDICATION: sob TECHNIQUE: 2 views / images submitted. COMPARISON: 03/27/2020 FINDINGS: The heart is normal in size. There are minimal patchy bilateral lung densities that are new from 03/27/2020. There is also slight blunting of the right costophrenic angle that is new from the prior study.     Suspect patchy bilateral pneumonia new from 03/27/2020. Small right effusion. Follow-up PA and lateral chest x-ray in a few weeks after acute illness recommended to document complete resolution. Radiologist location ID: [id number]     IR TUNNEL DIALYSIS CATHETER PLACEMENT    Result Date: 10/18/2020  RADIOLOGIST: Austin Miles, MD EXAM DATE/TIME:  10/18/2020 1:09 PM PROCEDURE: Venous access with ultrasound guidance Tunneled Hemodialysis catheter insertion under fluoroscopic guidance Additional procedure(s): None Indication (QCDR): Performance of hemodialysis PROCEDURE DETAILS: Consent: The risks, benefits, and alternatives of the procedure were discussed with the patient. Verbal and written consent was obtained.. Time-out was performed prior to the procedure. Preparation (MIPS): The site was prepared and draped using all elements of maximal sterile barrier technique including sterile gloves, sterile gown, cap, mask, large sterile sheet, sterile ultrasound probe  cover, hand hygiene and cutaneous antisepsis with 2% chlorhexidine. Medical reason for site preparation exception (MIPS): Not applicable Meds given: The patient was  administered the following meds, in carefully titrated doses: None The patient was monitored by the IR Nurse, under attending supervision, with continuous monitoring of the patients level of consciousness and physiologic status. Local anesthesia was administered using: 1% Lidocaine with Epinephrine. Access Vein accessed: Right  Internal jugular vein The vessel was sonographically evaluated and determined to be patent. Real time ultrasound was used to visualize needle entry into the vessel and a permanent image was stored. Access technique: Micropuncture set with 21 gauge needle Catheter placement An incision was made near the venous access site and the catheter was tunneled subcutaneously to the venous access. The catheter was advanced via a peel-away sheath into the vein under fluoroscopic guidance. The catheter tip was in the expected location of : cavoatrial junction. Catheter tip location was fluoroscopically verified and a permanent image was stored. Both lumens were checked for adequate flow. Catheter size Leeroy Bock): 14.5 Catheter Length (CM): Catheter placed: Catheter flush: Heparin (5000 units/mL) Closure Access site closure technique: Tissue adhesive Catheter securement technique: Non-absorbable suture A sterile dressing was applied. Complications: No immediate complications. Estimated blood loss (mL): Less than 10 PATIENT DISPOSITION: After  observation, discharged home in stable condition.Marland Kitchen  POST PROCEDURE INSTRUCTIONS: Post procedure instructions were given.     Interventional Radiology Procedure, as described above. Additional Follow up Instructions: Plan: The catheter may be used immediately. Radiation Dose: Fluoroscopy time: minutes MIPS Measure #145: performance measure met Radiologist location ID: [id number]     IR IR/VASCULAR MEDICINE  CONSULT    Result Date: 10/18/2020  Date: 10/18/2020 12:28 PM                                                   Interventional Radiology Visit Chief Complaint/ Reason for consult/visit : We will consult the to evaluate the patient's left upper arm AV fistula. History of Present Illness:The patient had a left upper arm AV fistula placed on half months ago. Patient needs dialysis today. On clinical inspection, the fistula was felt to be too deep. Review of Systems:Patient reports anxiety and mild shortness of breath. Denies any arm pain or weakness or color changes. Past Medical and Surgical History: Reviewed. Meds: Reviewed. Allergies: Reviewed. Family History:Reviewed.  Non contributary Social History:Reviewed. Relevant Labs and imaging findings: Independently reviewed by me and discussed with patient/ family. An ultrasound was done of the left upper extremity AV fistula. FINDINGS: The patient has a left upper arm AV fistula. There is approximately 20% juxta anastomotic stenosis. There was a 5 mm collateral vein arising approximately 12 cm from the anastomosis. The vein of the fistula measures 7 mm, but is greater than 15 mm deep in the mid humerus level and greater than 20 mm deep in the proximal humerus level. There is no hematoma. No pseudoaneurysm or thrombus. Relevant clinical notes : Reviewed. Clinical Examination: Vitals are stable, recorded in the chart. I could not palpate the fistula.     Impression: The patient was informed that her AV fistula is adequate size. However it is deep and it is not be very difficult to access it without image guidance. Patient needs dialysis today and we will Araceli Bouche place a tunneled hemodialysis catheter in the right IJ vein. The patient will be referred to her vascular surgeon for revision to make the fistula more superficial. At the same time, he can ligate the collateral  vein. Patient management was discussed with : 25 minutes were spent for the following clinical activities,  directly related to today's encounter: care coordination, counseling and educating the patient, family and/or caregiver, documenting clinical information in the electronic or other health record, independently interpreting results and communicating results to the patient, family and/or caregiver, getting and/or reviewing separately obtained history, ordering medications, tests or procedures, performing a medically appropriate exam and/or evaluation, preparing to see the patient (e.g., reviewing tests), referring the patient to and communicating with other health care professionals. Portions of this note may be dictated using voice recognition software or a dictation service. Variances in spelling and vocabulary are possible and unintentional. Not all errors are caught/corrected. Please notify the [facility] if any discrepancies are noted or if the meaning of any statement is not clear. [id number] Radiologist location ID: [id number]     ECG 12-Lead    Result Date: 10/18/2020  Sinus tachycardia Cannot rule out Anterior infarct (cited on or before 16-Oct-2020) Abnormal ECG When compared with ECG of 15-Mar-2020 10:58, No significant change was found  SEE ED PROVIDER NOTE IN EPIC FOR FINAL INTERPRETATION Confirmed by Deuell MD, Shanon Brow ([id number]), editor [place], MONALIZA [phone]) on 10/18/2020 11:41:43 AM    TRANSTHORACIC ECHOCARDIOGRAM - ADULT    Result Date: 10/18/2020  **See full report in linked PDF document[facility]                                                                     [address] [place], [place] [address]                                                                                  Transthoracic Echocardiographic Report           ______________________________________________________________________________ Name: ANGELYNE, TERWILLIGER                       MRN: [id number]               Weight: 285 lb Study Date: 10/18/2020 08:34 AM             DOB: 22-Apr-1966             Height: 66 in Gender: Female                               Age: 7 yrs                 BSA: 2.3 m2 Accession #: [id number]                   BP: 138/65 mmHg Patient Location: [facility] ECHO [facility] Ordering Provider: Brenton Grills Tech: NICHOLE Delray Alt, RDCS           ______________________________________________________________________________ Procedure:  Transthoracic complete echo with contrast, 2D, spectral and tissue Doppler, color flow Doppler, M-mode. Quality:  The study  images were of technically adequate quality. Indications: Heart failure (CMS HCC) History: HTN, CKD, DM, AKI, OBESITY Conclusions: Technically difficult due to patient body habitus. The left ventricular ejection fraction by visual assessment is estimated to be 55-60%. Mildly calcified aortic valve leaflets --especially the right coronary cusp. There is a small mobile density on the aortic valve seen mainly in parasternal long axis views. No aortic valve stenosis or regurgitation. The study images were of technically adequate quality. Transthoracic complete echo with contrast, 2D, spectral and tissue Doppler, color flow Doppler, M-mode. Concentric remodeling. Left ventricular diastolic parameters are normal. The mitral valve is not well visualized. No evidence of mitral stenosis. There is mild mitral regurgitation. Trace aortic regurgitation present. The pulmonic valve is normal. No significant pulmonic valve regurgitation present. Trileaflet aortic valve. There is a small mobile density on the aortic valve. The interatrial septum is normal in appearance. Normal IVC size with <50% inspiratory collapse (estimated RA pressure: 8 mmHg). The aortic root is of normal size. Normal pericardium with no pericardial effusion. Findings Left Ventricle:   Normal left ventricular size. Concentric remodeling. Left ventricular systolic function is normal. The left ventricular ejection fraction by visual assessment is estimated to be 55- 60%. No segmental/regional wall motion abnormalities  identified. Left ventricular diastolic parameters are normal. Right Ventricle:   The right ventricle is not well visualized. RV systolic pressure could not be determined due to incomplete tricuspid regurgitation envelope. Left Atrium:   Severely dilated left atrium. Right Atrium:   The right atrium is of normal size. Mitral Valve:   The mitral valve is not well visualized. No evidence of mitral stenosis. There is mild mitral regurgitation. Tricuspid Valve:   The tricuspid valve is not well visualized. Aortic Valve:   Trileaflet aortic valve. Mildly calcified aortic valve leaflets --especially the right coronary cusp. Trace aortic regurgitation present. There is a small mobile density on the aortic valve. Pulmonic Valve:   The pulmonic valve is normal. No significant pulmonic valve regurgitation present. Pulmonary Artery:   The pulmonary artery appears normal. Atrial Septum:   The interatrial septum is normal in appearance. IVC/Hepatic Veins:   Normal IVC size with <50% inspiratory collapse (estimated RA pressure: 8 mmHg). Aorta:   The aortic root is of normal size. The ascending aorta is normal in size. Pericardium/Pleural space:   Normal pericardium with no pericardial effusion. Electronically signed by: MD Julian Hy on 10/18/2020 10:01 AM     IR US DUPLEX HEMODIALYSIS ACCESS    Result Date: 10/18/2020  RADIOLOGIST: Austin Miles, MD EXAMINATION: IR US DUPLEX HEMODIALYSIS ACCESS EXAM DATE/TIME:  10/18/2020 12:27 PM CLINICAL INDICATION: Assess LUA AVF for suitability to start HD on 10/18/20 PROCEDURE: Doppler Ultrasound of:  AV Fistula Location: Left Upper Arm History:  Failure to mature Technique: Grayscale, color flow Doppler imaging and spectral Doppler waveform analysis was performed.     Findings and Impression: The patient has a left upper arm AV fistula. There is approximately 20% juxta anastomotic stenosis. There was a 5 mm collateral vein arising approximately 12 cm from the anastomosis. The vein of the  fistula measures 7 mm, but is greater than 15 mm deep in the mid humerus level and greater than 20 mm deep in the proximal humerus level. There is no hematoma. No pseudoaneurysm or thrombus. Radiologist location ID: [id number]            CONDITION ON DISCHARGE: Alert, Oriented and VS Stable    DISCHARGE DISPOSITION:  Home discharge  AVS reviewed with patient/care giver.  A written copy of the AVS and discharge instructions given to the patient/care giver. Patient/Family was in agreement, endorsed understanding, and all questions were answered. Patient/care giver encouraged to follow up with PCP as indicated.  In the event of an emergency, patient/care giver instructed to call 911 or go to the nearest emergency room.     cc: Primary Care Physician:  Ocie Doyne, NP  [place] [address]     LT:[id number] Physician:  No referring provider defined for this encounter.     Time spent on disposition: Approximately 45-55 minutes.     Portions of this note may be dictated using voice recognition software or a dictation service. Variances in spelling and vocabulary are possible and unintentional. Not all errors are caught/corrected. Please notify the [facility] if any discrepancies are noted or if the meaning of any statement is not clear.     Creta Levin, MD

## 2020-10-27 ENCOUNTER — Ambulatory Visit (HOSPITAL_COMMUNITY): Payer: 59 | Admitting: Anesthesiology

## 2020-10-27 ENCOUNTER — Inpatient Hospital Stay
Admission: RE | Admit: 2020-10-27 | Discharge: 2020-10-27 | Disposition: A | Discharge status: Home / Self Care | Payer: 59 | Source: Ambulatory Visit | Attending: VASCULAR SURGERY | Admitting: VASCULAR SURGERY

## 2020-10-27 ENCOUNTER — Encounter (HOSPITAL_COMMUNITY): Payer: Self-pay | Admitting: VASCULAR SURGERY

## 2020-10-27 ENCOUNTER — Encounter (HOSPITAL_COMMUNITY)
Admission: RE | Disposition: A | Discharge status: Home / Self Care | Payer: Self-pay | Source: Ambulatory Visit | Attending: VASCULAR SURGERY

## 2020-10-27 ENCOUNTER — Encounter (INDEPENDENT_AMBULATORY_CARE_PROVIDER_SITE_OTHER): Payer: Self-pay | Admitting: VASCULAR SURGERY

## 2020-10-27 ENCOUNTER — Ambulatory Visit (HOSPITAL_COMMUNITY): Admit: 2020-10-27 | Discharge: 2020-10-27 | Disposition: A | Discharge status: Home / Self Care | Payer: 59

## 2020-10-27 ENCOUNTER — Other Ambulatory Visit: Payer: Self-pay

## 2020-10-27 DIAGNOSIS — N186 End stage renal disease: Secondary | ICD-10-CM | POA: Insufficient documentation

## 2020-10-27 DIAGNOSIS — N185 Chronic kidney disease, stage 5: Secondary | ICD-10-CM

## 2020-10-27 DIAGNOSIS — Z992 Dependence on renal dialysis: Secondary | ICD-10-CM | POA: Insufficient documentation

## 2020-10-27 DIAGNOSIS — Z01812 Encounter for preprocedural laboratory examination: Secondary | ICD-10-CM | POA: Insufficient documentation

## 2020-10-27 HISTORY — PX: AV FISTULA PLACEMENT: SHX1204

## 2020-10-27 LAB — BASIC METABOLIC PANEL
ANION GAP: 10 mmol/L (ref 4–13)
BUN/CREA RATIO: 4 — ABNORMAL LOW (ref 6–22)
BUN: 9 mg/dL (ref 8–25)
CALCIUM: 9.1 mg/dL (ref 8.5–10.0)
CHLORIDE: 98 mmol/L (ref 96–111)
CO2 TOTAL: 31 mmol/L — ABNORMAL HIGH (ref 22–30)
CREATININE: 2.47 mg/dL — ABNORMAL HIGH (ref 0.60–1.05)
ESTIMATED GFR: 22 mL/min/BSA — ABNORMAL LOW (ref >=60)
GLUCOSE: 144 mg/dL — ABNORMAL HIGH (ref 65–125)
POTASSIUM: 3.6 mmol/L (ref 3.5–5.1)
SODIUM: 139 mmol/L (ref 136–145)

## 2020-10-27 SURGERY — REVISION FISTULA ARTERIO VENOUS
Anesthesia: General | Site: Arm | Laterality: Left | Wound class: Clean Wound: Uninfected operative wounds in which no inflammation occurred

## 2020-10-27 MED ORDER — PHENYLEPHRINE 10 MG IN NS 100 ML INFUSION - FOR ANES
INTRAVENOUS | Status: DC | PRN
Start: 2020-10-27 — End: 2020-10-27
  Administered 2020-10-27: .1 ug/kg/min via INTRAVENOUS
  Administered 2020-10-27: 0 ug/kg/min via INTRAVENOUS
  Administered 2020-10-27: .3 ug/kg/min via INTRAVENOUS

## 2020-10-27 MED ORDER — SODIUM CHLORIDE 0.9 % INTRAVENOUS SOLUTION
Freq: Once | INTRAVENOUS | Status: DC | PRN
Start: 2020-10-27 — End: 2020-10-27
  Administered 2020-10-27: 200 mL

## 2020-10-27 MED ORDER — CEFAZOLIN 1 GRAM SOLUTION FOR INJECTION
Freq: Once | INTRAMUSCULAR | Status: DC | PRN
Start: 2020-10-27 — End: 2020-10-27
  Administered 2020-10-27: 2000 mg via INTRAVENOUS

## 2020-10-27 MED ORDER — ALBUTEROL SULFATE CONCENTRATE 2.5 MG/0.5 ML SOLUTION FOR NEBULIZATION
5.0000 mg | INHALATION_SOLUTION | Freq: Once | RESPIRATORY_TRACT | Status: DC | PRN
Start: 2020-10-27 — End: 2020-10-27

## 2020-10-27 MED ORDER — LIDOCAINE (PF) 100 MG/5 ML (2 %) INTRAVENOUS SYRINGE
INJECTION | Freq: Once | INTRAVENOUS | Status: DC | PRN
Start: 2020-10-27 — End: 2020-10-27
  Administered 2020-10-27 (×2): 100 mg via INTRAVENOUS

## 2020-10-27 MED ORDER — ACETAMINOPHEN 1,000 MG/100 ML (10 MG/ML) INTRAVENOUS SOLUTION
1000.0000 mg | Freq: Four times a day (QID) | INTRAVENOUS | Status: DC | PRN
Start: 2020-10-27 — End: 2020-10-27
  Administered 2020-10-27: 0 mg via INTRAVENOUS
  Administered 2020-10-27: 1000 mg via INTRAVENOUS
  Filled 2020-10-27: qty 100

## 2020-10-27 MED ORDER — NEOSTIGMINE METHYLSULFATE 1 MG/ML INTRAVENOUS SOLUTION
Freq: Once | INTRAVENOUS | Status: DC | PRN
Start: 2020-10-27 — End: 2020-10-27
  Administered 2020-10-27: 4 mg via INTRAVENOUS

## 2020-10-27 MED ORDER — GLYCOPYRROLATE 0.2 MG/ML INJECTION SOLUTION
Freq: Once | INTRAMUSCULAR | Status: DC | PRN
Start: 2020-10-27 — End: 2020-10-27
  Administered 2020-10-27: .6 mg via INTRAVENOUS

## 2020-10-27 MED ORDER — ROCURONIUM 10 MG/ML INTRAVENOUS SOLUTION
Freq: Once | INTRAVENOUS | Status: DC | PRN
Start: 2020-10-27 — End: 2020-10-27
  Administered 2020-10-27: 30 mg via INTRAVENOUS
  Administered 2020-10-27: 10 mg via INTRAVENOUS

## 2020-10-27 MED ORDER — PROPOFOL 10 MG/ML IV BOLUS
INJECTION | Freq: Once | INTRAVENOUS | Status: DC | PRN
Start: 2020-10-27 — End: 2020-10-27
  Administered 2020-10-27: 150 mg via INTRAVENOUS

## 2020-10-27 MED ORDER — FENTANYL (PF) 50 MCG/ML INJECTION SOLUTION
50.0000 ug | INTRAMUSCULAR | Status: DC | PRN
Start: 2020-10-27 — End: 2020-10-27

## 2020-10-27 MED ORDER — ONDANSETRON HCL (PF) 4 MG/2 ML INJECTION SOLUTION
4.0000 mg | INTRAMUSCULAR | Status: DC | PRN
Start: 2020-10-27 — End: 2020-10-27

## 2020-10-27 MED ORDER — BUPIVACAINE (PF) 0.25 % (2.5 MG/ML) INJECTION SOLUTION
INTRAMUSCULAR | Status: AC
Start: 2020-10-27 — End: 2020-10-27
  Filled 2020-10-27: qty 30

## 2020-10-27 MED ORDER — SODIUM CHLORIDE 0.9 % INTRAVENOUS SOLUTION
INTRAVENOUS | Status: DC
Start: 2020-10-27 — End: 2020-10-27
  Administered 2020-10-27: 0 mL via INTRAVENOUS

## 2020-10-27 MED ORDER — PHENYLEPHRINE 1 MG/10 ML (100 MCG/ML) IN 0.9 % SOD.CHLORIDE IV SYRINGE
INJECTION | Freq: Once | INTRAVENOUS | Status: DC | PRN
Start: 2020-10-27 — End: 2020-10-27
  Administered 2020-10-27: 200 ug via INTRAVENOUS
  Administered 2020-10-27 (×2): 100 ug via INTRAVENOUS

## 2020-10-27 MED ORDER — LIDOCAINE (PF) 10 MG/ML (1 %) INJECTION SOLUTION
INTRAMUSCULAR | Status: AC
Start: 2020-10-27 — End: 2020-10-27
  Filled 2020-10-27: qty 60

## 2020-10-27 MED ORDER — FENTANYL (PF) 50 MCG/ML INJECTION SOLUTION
Freq: Once | INTRAMUSCULAR | Status: DC | PRN
Start: 2020-10-27 — End: 2020-10-27
  Administered 2020-10-27: 50 ug via INTRAVENOUS
  Administered 2020-10-27: 25 ug via INTRAVENOUS
  Administered 2020-10-27: 50 ug via INTRAVENOUS
  Administered 2020-10-27: 25 ug via INTRAVENOUS
  Administered 2020-10-27: 50 ug via INTRAVENOUS

## 2020-10-27 MED ORDER — HEPARIN (PORCINE) (PF) 2,000 UNIT/1,000 ML IN 0.9 % SODIUM CHLORIDE IV
INTRAVENOUS | Status: AC
Start: 2020-10-27 — End: 2020-10-27
  Filled 2020-10-27: qty 1000

## 2020-10-27 MED ORDER — BUPIVACAINE (PF) 0.25 % (2.5 MG/ML) INJECTION SOLUTION
Freq: Once | INTRAMUSCULAR | Status: DC | PRN
Start: 2020-10-27 — End: 2020-10-27
  Administered 2020-10-27: 20 mL

## 2020-10-27 SURGICAL SUPPLY — 21 items
APPLIER PREM SRGCLP SUP INTLK 11.5IN 30 ATO CLIP LGT ERG HNDL TAB RATCHET CLSR TI MED INTERNAL CLIP (ENDOSCOPIC SUPPLIES) ×1 IMPLANT
APPLIER PREM SRGCLP SUP INTLK_11.5IN 30 ATO CLIP LGT ERG (INSTRUMENTS ENDOMECHANICAL) ×1
CONV USE ITEM 338654 - PACK SURG VAS NONST DISP LF (CUSTOM TRAYS & PACK) ×1 IMPLANT
DCNTR FLUID 9IN SET BAG (MISCELLANEOUS PT CARE ITEMS) ×2 IMPLANT
DRAPE ABS REINF 2 ELAS FENESTR ATE HKLP LINE HLDR CONTOUR (PROTECTIVE PRODUCTS/GARMENTS) ×1
DRAPE ABS REINF 2 ELAS FENESTRATE HKLP LINE HLDR CONTOUR ARMBRD CVR 138X107X90IN BILAT LIMB PRXM LF (PROTECTIVE PRODUCTS/GARMENTS) ×1 IMPLANT
DRAPE ABS REINF ELAS FEN CNTCT CLSR 128X89IN STD PRXM LF  STRL DISP SURG SMS 55X23IN (PROTECTIVE PRODUCTS/GARMENTS) ×1 IMPLANT
DRAPE ABS REINF ELAS FEN CNTCT_CLSR 128X89IN STD PRXM LF (PROTECTIVE PRODUCTS/GARMENTS) ×2
DRAPE FNFLD ABS REINF 77X53IN 43528 PRXM LF  STRL DISP SURG SMS 44X23IN (PROTECTIVE PRODUCTS/GARMENTS) ×1 IMPLANT
DRAPE FNFLD ABS REINF 77X53IN 43528 PRXM LF STRL DISP SURG (PROTECTIVE PRODUCTS/GARMENTS) ×1
DUPE USE ITEM 319434 - SUTURE 2-0 SH VICRYL 18IN VIOL_CR BRD 8 STRN COAT ABS (SUTURE/WOUND CLOSURE) ×3 IMPLANT
GOWN SURG 2XL XLNG L4 REINF HK LP CLSR BRTHBL FILM SLEEVE (PROTECTIVE PRODUCTS/GARMENTS) ×1
GOWN SURG 2XL XLNG L4 REINF HKLP CLSR BRTHBL FILM SLEEVE STRL LF  DISP BLU SIRUS SMS PE 56IN (PROTECTIVE PRODUCTS/GARMENTS) ×1 IMPLANT
GOWN SURG 3XL XLNG AAMI L4 IMP RV RGLN SLEEVE BRTHBL STRL LF (PROTECTIVE PRODUCTS/GARMENTS) ×1
GOWN SURG 3XL XLNG AAMI L4 IMP_RV RGLN SLEEVE BRTHBL STRL LF (PROTECTIVE PRODUCTS/GARMENTS) ×1
GOWN SURG 3XL XLNG L4 RGLN SLEEVE BRTHBL STRL LF  DISP SMARTGOWN (PROTECTIVE PRODUCTS/GARMENTS) ×1 IMPLANT
PACK SURG VAS NONST DISP LF (CUSTOM TRAYS & PACK) ×1
PACK VASCULAR 1/CS (CUSTOM TRAYS & PACK) ×1
SOL IRRG 0.9% NACL 1000ML PLASTIC PR BTL ISTNC N-PYRG STRL LF (SOLUTIONS) ×1 IMPLANT
SOLUTION IRRG NS 2F7124 1000CC 12/CS (SOLUTIONS) ×1
SUTURE 2-0 SH VICRYL 18IN VIOL_CR BRD 8 STRN COAT ABS (SUTURE/WOUND CLOSURE) ×3

## 2020-10-27 NOTE — Nurses Notes (Signed)
Pt tolerating PO fluids well

## 2020-10-27 NOTE — OR Surgeon (Signed)
PATIENT NAME: [name], [place] NUMBER:  [id number]  DATE OF SERVICE: 10/27/2020  DATE OF BIRTH:  26-Apr-1966    OPERATIVE REPORT    PREOPERATIVE DIAGNOSIS:  End-stage renal disease on dialysis, status post left upper extremity brachiocephalic fistula creation in November 2021.    POSTOPERATIVE DIAGNOSIS:  End-stage renal disease on dialysis, status post left upper extremity brachiocephalic fistula creation in November 2021.    NAME OF PROCEDURE:  Revision of left upper extremity brachiocephalic fistula without thrombectomy (fistula elevation). (CPT [phone])    ATTENDING SURGEON:  Kaylyn Lim, MD.    ASSISTANT:  Surgical tech.    ANESTHESIA:  General endotracheal anesthesia.    ESTIMATED BLOOD LOSS:  20 mL.    COMPLICATIONS:  None.    INDICATIONS FOR PROCEDURES:  [name] is a 55 year old female with CKD 5/end-stage renal disease.  She presented to my clinic at the end of 2021 for consideration of fistula creation, which we performed in November 2021. She recovered well from that procedure without complications. After her fistula maturation, ultrasound was performed and the fistula was deemed too deep for cannulation and a second-stage procedure was necessary to elevate the fistula.  I discussed the indication, risks, benefits, and alternatives for a revision of left upper extremity brachiocephalic AV fistula with the patient and she agreed to proceed and signed informed consent.    FINDINGS:  There was a large side branch that was going laterally into the upper arm soft tissues that was ligated and there were a couple of small branches that were also ligated and clipped.  After the procedure and elevation, there was excellent thrill in the fistula and a palpable pulse on the left hand.    DESCRIPTION OF PROCEDURE:  After appropriate consent was obtained, the patient was brought to the operating room and placed supine on the operative table.  Anesthesia proceeded to sedating and intubating patient.   We then prepped and draped her left upper extremity in usual sterile fashion and a time-out procedure was performed.  The identity of the patient was checked, site of surgery was checked.  She received 2 grams of Ancef for perioperative antibiotic prophylaxis.  After that, I made a longitudinal incision along her cephalic vein and dissected the soft tissue using blunt and sharp dissection with cautery down to the fistula.  The fistula was encircled with a red vessel loop and retracted.  After that, I marched proximally and distally on the fistula, dividing the skin and subcutaneous tissue in a sequential manner until I was able to free up all the fistula from the underlying soft tissue.  There were 3 venous branches that were ligated with suture ligature.  One of them was large lateral to the fistula branch.  After the vein was completely mobilized, I performed a skin flap on the lateral side of her incision and elevated the skin off the subcutaneous fat to create a pocket for the fistula.  The subcutaneous tissues were reapproximated below the fistula using 2 interrupted 2-0 Vicryl suture and then the fistula was placed in the newly created skin pocket.  After that, hemostasis was achieved and I closed the incision with 4-0 Monocryl subcuticular running suture and sterile glue was applied on top of that.  The patient tolerated the procedure well, awakened, extubated, and transferred to PACU in good condition.        Kaylyn Lim, MD  Assistant Professor  DD:  10/27/2020 14:03:38  DT:  10/27/2020 15:20:02 FP  D#:  [id number]

## 2020-10-27 NOTE — Anesthesia Preprocedure Evaluation (Addendum)
ANESTHESIA PRE-OP EVALUATION  Planned Procedure: REVISION FISTULA ARTERIO VENOUS (Left )  Review of Systems       no family history of anesthetic complications   patient summary reviewed          Pulmonary    no recent URI   Cardiovascular    Hypertension and well controlled ,No past MI,  Exercise Tolerance: > or = 4 METS        GI/Hepatic/Renal    Had dialysis this morning, fatty liver, liver disease, renal insufficiency and dialysis treatment no GERD     Endo/Other    morbid obesity,   type 2 diabetes     Neuro/Psych/MS    anxiety     Cancer  CA,                    Physical Assessment      Patient summary reviewed   Airway       Mallampati: II    TM distance: >3 FB    Neck ROM: full  Mouth Opening: good.            Dental           (+) chipped, missing             Pulmonary    Breath sounds clear to auscultation       Cardiovascular    Rhythm: regular  Rate: Normal       Other findings            Plan  ASA 4     Planned anesthesia type: general     general anesthesia with endotracheal tube intubation              Intravenous induction     Anesthesia issues/risks discussed are: Post-op Intubation/Ventilation, Post-op Pain Management, Intraoperative Awareness/ Recall, Aspiration, PONV, Stroke, Sore Throat, Cardiac Events/MI, Blood Loss and Dental Injuries.  Anesthetic plan and risks discussed with patient.      Use of blood products discussed with patient who consented to blood products.     Patient's NPO status is appropriate for Anesthesia.           Plan discussed with CRNA.                  ECHO 10/18/20  Conclusions:  Technically difficult due to patient body habitus.  The left ventricular ejection fraction by visual assessment is estimated to be 55-60%.  Mildly calcified aortic valve leaflets --especially the right coronary cusp.  There is a small mobile density on the aortic valve seen mainly in parasternal long axis views. No aortic valve stenosis or regurgitation.  The study images were of technically  adequate quality.  Transthoracic complete echo with contrast, 2D, spectral and tissue Doppler, color flow Doppler, M-mode.  Concentric remodeling.  Left ventricular diastolic parameters are normal.  The mitral valve is not well visualized.  No evidence of mitral stenosis.  There is mild mitral regurgitation.  Trace aortic regurgitation present.  The pulmonic valve is normal.  No significant pulmonic valve regurgitation present.  Trileaflet aortic valve.  There is a small mobile density on the aortic valve.  The interatrial septum is normal in appearance.  Normal IVC size with <50% inspiratory collapse (estimated RA pressure: 8 mmHg).  The aortic root is of normal size.  Normal pericardium with no pericardial effusion.    Labs pending    ECG 10/16/20  Sinus tachycardia  Cannot rule out Anterior infarct (  cited on or before 16-Oct-2020)  Abnormal ECG  When compared with ECG of 15-Mar-2020 10:58,  No significant change was found    Discussed risks/benefits of General anesthesia and surgical regional block with pt. Pt states she desires not to be awake or sedated, but completely under general anesthesia. Denies GERD. NPO appropriate.     GETA  AQA

## 2020-10-27 NOTE — Discharge Instructions (Addendum)
[facility]  [place], [place] [address]     Anesthesia Discharge Instructions    1.  Do NOT drive an automobile today.    2.  Do NOT sign any legal documents for 24 hours.    3.  Do NOT operate any electrical equipment today.    Fall risk prevention education has been reviewed with the patient/significant other.

## 2020-10-27 NOTE — Nurses Notes (Signed)
Patient discharged home with family.  AVS reviewed with patient/care giver.  A written copy of the AVS and discharge instructions was given to the patient/care giver.  Questions sufficiently answered as needed.  Patient/care giver encouraged to follow up with PCP as indicated.  In the event of an emergency, patient/care giver instructed to call 911 or go to the nearest emergency room.

## 2020-10-27 NOTE — H&P (Signed)
[facility]      H&P UPDATE FORM                                                                                  Nicle, Connole, 55 y.o. female  Date of Admission:  10/27/2020  Date of Birth:  06-19-1966    10/27/2020    STOP: IF H&P IS GREATER THAN 30 DAYS FROM SURGICAL DAY COMPLETE NEW H&P IS REQUIRED.     H & P updated the day of the procedure.  1.  H&P completed within 30 days of surgical procedure on 10/19/2020 and has been reviewed within 24 hours of the surgery, the patient has been examined, and no change has occured in the patients condition since the H&P was completed.       Change in medications: No     Patient's last menstrual period was 10/24/2014 (approximate).      Comments:     2.  Patient continues to be appropriate candidate for planned surgical procedure. YES    Kaylyn Lim, MD

## 2020-10-27 NOTE — Brief Op Note (Signed)
[facility]                                                     BRIEF OPERATIVE NOTE    Patient Name: [name], Cerino Number: [id number]  Date of Service: 10/27/2020   Date of Birth: 06/06/1966    All elements must be documented.    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   Post-Operative Diagnosis: same  Procedure(s)/Description:  Revision of LUE BC AVF without thrombectomy    Findings/Complexity (inherent to the procedure performed):   Fistula elevated under skin flap     Attending Surgeon: Kaylyn Lim  Assistant(s): Surg tech    Anesthesia Type: General  Estimated Blood Loss:  20cc  Blood Given: 0cc  Fluids Given: 292KM  Complications (not routinely expected or not inherent to difficulty/nature of procedure):none  Characteristic Event (routinely expected or inherent to the difficulty/nature of the procedure): none  Did the use of current and/or prior Anticoagulants impact the outcome of the case? no  Wound Class: Clean Wound: Uninfected operative wounds in which no inflammation occurred    Tubes: None  Drains: None  Specimens/ Cultures:  None  Implants: none           Disposition: PACU - hemodynamically stable.  Condition: stable    Kaylyn Lim, MD

## 2020-10-27 NOTE — Discharge Summary (Signed)
[facility]  DISCHARGE SUMMARY    PATIENT NAME:  [name], May  MRN:  [id number]  DOB:  03/11/1966    ENCOUNTER DATE:  10/27/2020  INPATIENT ADMISSION DATE:   DISCHARGE DATE:  10/27/2020    ATTENDING PHYSICIAN: Kaylyn Lim, MD  SERVICE: [facility] VASCULAR  PRIMARY CARE PHYSICIAN: Ocie Doyne, NP         LAY CAREGIVER:  ,  ,        PRIMARY DISCHARGE DIAGNOSIS: CKD (chronic kidney disease), stage V (CMS [place])  Active Hospital Problems    Diagnosis Date Noted    Principle Problem: CKD (chronic kidney disease), stage V (CMS HCC) [N18.5] 08/03/2020      Resolved Hospital Problems   No resolved problems to display.     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 difficile diarrhea 12/02/2014    Essential hypertension 11/30/2014    Obesity 11/30/2014    Cellulitis of foot, left 11/29/2014    Type 2 diabetes mellitus (CMS [place]) 10/25/2014    Diabetic foot infection (CMS [place]) 10/24/2014        DISCHARGE MEDICATIONS:     Current Discharge Medication List      CONTINUE these medications - NO CHANGES were made during your visit.      Details   albuterol sulfate 90 mcg/actuation HFA Aerosol Inhaler  Commonly known as: PROVENTIL or VENTOLIN or PROAIR   2 Puffs, Inhalation, EVERY 4 HOURS PRN  Qty: 2 Inhaler  Refills: 0     ALPRAZolam 0.25 mg Tablet  Commonly known as: XANAX   0.25 mg, Oral, DAILY PRN  Refills: 0     atorvastatin 20 mg Tablet  Commonly known as: LIPITOR   20 mg, Oral, EVERY EVENING  Refills: 0     calcitrioL 0.25 mcg Capsule  Commonly known as: ROCALTROL   0.25 mcg, Oral, DAILY  Refills: 0     escitalopram oxalate 10 mg Tablet  Commonly known as: LEXAPRO   10 mg, Oral,  NIGHTLY  Refills: 0     Fish Oil-Omega-3 Fatty Acids 360-1,200 mg Capsule   2 Capsules, Oral, 2 TIMES DAILY  Refills: 0     fluticasone propionate 50 mcg/actuation Spray, Suspension  Commonly known as: FLONASE   1 Spray, Each Nostril, DAILY PRN  Refills: 0     Januvia 50 mg Tablet  Generic drug: SITagliptin   DAILY  Refills: 0     labetaloL 100 mg Tablet  Commonly known as: NORMODYNE   50 mg, Oral, 2 TIMES DAILY  Qty: 14 Tablet  Refills: 0     loperamide 2 mg Capsule  Commonly known as: IMODIUM   2 mg, Oral, EVERY 4 HOURS PRN  Refills: 0     loratadine 10 mg Tablet  Commonly known as: CLARITIN   10 mg, Oral  Refills: 0     losartan 25 mg Tablet  Commonly known as: COZAAR   25 mg, Oral, DAILY  Qty: 30 Tablet  Refills: 0          Discharge med list refreshed?  YES                     ALLERGIES:  Allergies   Allergen Reactions    Vicodin [Hydrocodone-Acetaminophen] Itching             HOSPITAL PROCEDURE(S):   Bedside Procedures:  No orders of the defined types were placed in this encounter.    Surgical   Surgical/Procedural Cases on this Admission     Case IDs Date Procedure Surgeon Location Status    [id number] 10/27/20 REVISION FISTULA ARTERIO VENOUS Kaylyn Lim, MD [facility] OR MAIN [facility]          REASON FOR HOSPITALIZATION AND HOSPITAL COURSE     BRIEF HPI:  This is a 55 y.o., female admitted for 2nd stage fistula elevation after creation of LUE AVF in December 2021    [place]:         She underwent revision of LUE BC AVF without thrombectomy and was discharged to home     TRANSITION/POST DISCHARGE CARE/PENDING TESTS/REFERRALS: none        CONDITION ON DISCHARGE:  A. Ambulation: Full ambulation  B. Self-care Ability: Complete  C. Cognitive Status Alert and Oriented x 3  D. Code status at discharge:             LINES/DRAINS/WOUNDS AT DISCHARGE:   Patient Lines/Drains/Airways Status     Active Line / Dialysis Catheter / Dialysis Graft / Drain / Airway / Wound     Name Placement date Placement time Site Days     Peripheral IV Right Forearm 10/27/20  --  -- less than 1    AV Graft Left --  --  -- --    Dialysis Catheter Cuffed/Tunneled 10/18/20  1307  -- 9    Surgical Incision Anterior;Left;Lower;Proximal Arm 10/27/20  1255  -- less than 1                DISCHARGE DISPOSITION:  Home discharge              DISCHARGE INSTRUCTIONS:   [facility] [place] .    Specialty: Vascular Surgery  Contact information:  [place] [id number]  [phone]                        DISCHARGE INSTRUCTION - ACTIVITY    No heavy lifting on LUE (<10lb) for 6 weeks     Activity: AS INSTRUCTED      DISCHARGE INSTRUCTION - INCISION/WOUND CARE    Shower in 2 days. Do not pick off glue from incision, it will fall off on its own. Do not rub your wound, pat dry after shower.     Instructions for incision/wound care: NO Tub Baths / Pools for 1 week      ASPIRIN NOT ORDERED AT THIS TIME     FOLLOW-UP: [place], [place]     Follow-up in: [place]    Reason for visit: HOSPITAL DISCHARGE    Follow-up reason: s/p LUE AVF revision    Provider: Edmonia Caprio, MD    Copies sent to Care Team       Relationship Specialty Notifications Start End    Ocie Doyne, NP PCP -  General EXTERNAL  08/15/19     Phone: [phone] Fax: [phone]         [address] [place] [address]            Referring providers can utilize [email or web address] to access their referred [place] patient's information.

## 2020-10-27 NOTE — Anesthesia Postprocedure Evaluation (Signed)
Anesthesia Post Op Evaluation    Patient: [name]  Procedure(s):  REVISION FISTULA ARTERIO VENOUS    Last Vitals:Temperature: 37.3 C (99.1 F) (10/27/20 1405)  Heart Rate: 93 (10/27/20 1405)  BP (Non-Invasive): 137/78 (10/27/20 1405)  Respiratory Rate: 16 (10/27/20 1405)  SpO2: 100 % (10/27/20 1610)    No complications documented.    Patient is sufficiently recovered from the effects of anesthesia to participate in the evaluation and has returned to their pre-procedure level.  Patient location during evaluation: PACU       Patient participation: complete - patient participated  Level of consciousness: awake and alert and responsive to verbal stimuli    Pain management: adequate  Airway patency: patent    Anesthetic complications: no  Cardiovascular status: acceptable  Respiratory status: acceptable  Hydration status: acceptable  Patient post-procedure temperature: Pt Normothermic   PONV Status: Absent

## 2020-10-27 NOTE — Nurses Notes (Signed)
Pt meets discharge criteria at this time. Pt is getting dressed and waiting for daughter to arrive for D/C home

## 2020-10-27 NOTE — Anesthesia Transfer of Care (Signed)
ANESTHESIA TRANSFER OF CARE   [name] is a 55 y.o. ,female, Weight: 129 kg (285 lb)   had Procedure(s):  REVISION FISTULA ARTERIO VENOUS  performed  10/27/20   Primary Service: Kaylyn Lim, MD    Past Medical History:   Diagnosis Date   . Anxiety    . Arthritis     hips   . Beta blocker prescribed for left ventricular systolic dysfunction     Labetalol    . Cancer (CMS [place]) 2019, 2021    kidney, Clear Cell Cancer left kidney-S/P partial nephrectomy 2019, then radical nephrectomy 2021   . Cataract     S/P cataract extraction bilaterally    . Cellulitis 10/24/14    left foot   . Cellulitis 10/21/2020    LLE extremity 09/2020-resolved per patient    . Chronic pain     sec. to arthritis-hips   . 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 Constipation    . COVID 07/2019   . Depression    . Diabetes mellitus (CMS [place])    . Diabetes mellitus, type 2 (CMS HCC)    . Diarrhea    . Dyspnea on exertion    . Essential hypertension    . Exercise intolerance    . Fatty liver    . Fluid overload 08/21/21    Recent hospitalization for fluid overload, HD initiated 10/18/20   . Headache(784.0)    . Hemodialysis patient (CMS [place])     HD Monday-Wednesday-Friday   . 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 History of nephrectomy 03/24/2020    L nephrectomy   . Hx of echocardiogram 10/18/2020    Technically difficult d/t pt. body habitus; Left vent. EF est. 55-60%; mildy calcified aortic vlave leaflets-especially right coroanry cusp; Small mobile density on aortic valve-no aortic valve stenosis or regurg; Concentric remodeling; mild MR; Trace AR; Trileaflet aortic vlave; Nl IVC size wiht <50% insp. collapse;      Marland Kitchen Hyperlipidemia    . Hyperparathyroidism (CMS [place])    . Hyperthyroidism     Old records states hyperthyroidism, patient denies, states has hyperparathyroidism   . Irritable bowel syndrome    . Macular edema    . Morbid obesity (CMS [place])    . Obesity    . Panic attack    . Peripheral  edema     ankles/feet   . Peripheral neuropathy     bilateral feet   . Pneumonia 07/2019; 09/2020    COVID Pneumonia 07/2019; fluid overload/pneumonia with recent hosp. 09/2020   . Shortness of breath     SOB after climbing 8 steps    . Type 2 diabetes mellitus (CMS HCC)    . Wears glasses     reading   . White coat hypertension       Allergy History as of 10/27/20     HYDROCODONE-ACETAMINOPHEN       Noted Status Severity Type Reaction    10/24/14 1031 Oretha Milch, RN 10/24/14 Active Low  Itching              I completed my transfer of care / handoff to the receiving personnel during which we discussed:  Access, Airway, All key/critical aspects of case discussed, Analgesia, Antibiotics, Expectation of post procedure, Fluids/Product, Gave opportunity for questions and acknowledgement of understanding, Labs and PMHx    Post Location: PACU  Additional Info:Report to pacu rn                        Last OR Temp: Temperature: 37.3 C (99.1 F)  ABG:  POTASSIUM   Date Value Ref Range Status   10/27/2020 3.6 3.5 - 5.1 mmol/L Final   12/07/2014 4.1 3.5 - 5.0 mmol/L Final     KETONES   Date Value Ref Range Status   10/16/2020 Negative Negative mg/dL Final     KETONES,URINE   Date Value Ref Range Status   09/02/2012 NEGATIVE NEGATIVE mg/dL Final     CALCIUM   Date Value Ref Range Status   10/27/2020 9.1 8.5 - 10.0 mg/dL Final   12/07/2014 9.3 8.5 - 10.5 mg/dL Final     Calculated P Axis   Date Value Ref Range Status   10/16/2020 31 degrees Final     Calculated R Axis   Date Value Ref Range Status   10/16/2020 -4 degrees Final     Calculated T Axis   Date Value Ref Range Status   10/16/2020 34 degrees Final     Airway:* No LDAs found *  Blood pressure 137/78, pulse 93, temperature 37.3 C (99.1 F), resp. rate 16, height 1.702 m (5\' 7" ), weight 129 kg (285 lb), last menstrual period 10/24/2014, SpO2 100 %, not currently breastfeeding.

## 2020-10-28 ENCOUNTER — Other Ambulatory Visit (INDEPENDENT_AMBULATORY_CARE_PROVIDER_SITE_OTHER): Payer: Self-pay | Admitting: VASCULAR SURGERY

## 2020-10-28 MED ORDER — TRAMADOL 50 MG TABLET
1.0000 | ORAL_TABLET | Freq: Four times a day (QID) | ORAL | 0 refills | Status: DC | PRN
Start: 2020-10-28 — End: 2024-01-29

## 2020-11-01 ENCOUNTER — Ambulatory Visit: Payer: 59 | Attending: Gerontology | Admitting: Gerontology

## 2020-11-01 ENCOUNTER — Encounter (RURAL_HEALTH_CENTER): Payer: Self-pay | Admitting: Gerontology

## 2020-11-01 DIAGNOSIS — E1121 Type 2 diabetes mellitus with diabetic nephropathy: Secondary | ICD-10-CM

## 2020-11-01 DIAGNOSIS — I10 Essential (primary) hypertension: Secondary | ICD-10-CM

## 2020-11-01 DIAGNOSIS — Z992 Dependence on renal dialysis: Secondary | ICD-10-CM

## 2020-11-01 DIAGNOSIS — Z09 Encounter for follow-up examination after completed treatment for conditions other than malignant neoplasm: Secondary | ICD-10-CM

## 2020-11-01 DIAGNOSIS — C801 Malignant (primary) neoplasm, unspecified: Secondary | ICD-10-CM | POA: Insufficient documentation

## 2020-11-01 DIAGNOSIS — N186 End stage renal disease: Secondary | ICD-10-CM

## 2020-11-01 NOTE — Progress Notes (Signed)
Subjective:     Patient ID: Kelli Carlson is a 55 y.o. female.  Chief Complaint   Patient presents with   . Hospital Follow-up     fluid overload       HPI: Hospital d/c follow up did start HD through port, fistula revision to left arm, her blood sugar has been stable    The following portions of the patient's history were reviewed and updated as appropriate: allergies, current medications, past family history, past medical history, past social history, past surgical history and problem list.    Social History     Socioeconomic History   . Marital status: Married   Tobacco Use   . Smoking status: Never Smoker   . Smokeless tobacco: Never Used   Vaping Use   . Vaping Use: Never used   Substance and Sexual Activity   . Alcohol use: No     Alcohol/week: 0.0 standard drinks   . Drug use: No   . Sexual activity: Yes     Review of Systems   Constitutional: Positive for fatigue. Negative for fever.   HENT: Negative.    Respiratory: Negative for cough and shortness of breath.    Cardiovascular: Negative for chest pain and leg swelling.   Gastrointestinal: Negative.    Endocrine: Negative.    Genitourinary: Negative.                        Objective:    Physical Exam  Vitals and nursing note reviewed.   Cardiovascular:      Rate and Rhythm: Normal rate.   Pulmonary:      Effort: Pulmonary effort is normal.   Skin:            Comments: Revision site of fistula, erythema no drainage   Neurological:      Mental Status: She is alert.       Blood pressure 156/88, pulse 90, temperature 98.1 F (36.7 C), temperature source Temporal, resp. rate 18, height 1.702 m ('5\' 7"'$ ), weight 124.8 kg (275 lb 1.6 oz).        Wt Readings from Last 4 Encounters:   11/01/20 124.8 kg (275 lb 1.6 oz)   09/28/20 129.7 kg (286 lb)   09/24/20 129.5 kg (285 lb 7.9 oz)   06/16/20 132 kg (291 lb 0.1 oz)       Medical Decision Making:   1. CKD (chronic kidney disease) requiring chronic dialysis  She is now on HD via port, there was a revision to her  fistula of left arm, she is no longer SOB  2. Hospital discharge follow-up  Reviewed hospital stay from [facility] in Care everywhere  3. Essential hypertension  - labetalol (NORMODYNE) 100 MG tablet; Take 50 mg by mouth  BP Readings from Last 3 Encounters:   11/01/20 156/88   09/28/20 140/86   09/24/20 (!) 187/94   Restarted Losartan 100 mg po BID    4. Type 2 diabetes mellitus with diabetic nephropathy, without long-term current use of insulin  She is stable on Januvia currently, last FBS is 144    Return in about 4 weeks (around 11/29/2020).      Follow up sooner if no improvement or if symptoms worsen or persist.   Medication dosing instructions, drug-drug interactions and potential side effects discussed in detail.   Failure to comply with plan of care discussed with patient. Importance of adhering to follow up office visits discussed in detail.  Potential risks of noncompliance discussed with patient.    Ocie Doyne, NP

## 2020-11-05 ENCOUNTER — Ambulatory Visit (RURAL_HEALTH_CENTER): Payer: Self-pay

## 2020-11-18 ENCOUNTER — Encounter (INDEPENDENT_AMBULATORY_CARE_PROVIDER_SITE_OTHER): Payer: Self-pay | Admitting: VASCULAR SURGERY

## 2020-11-18 ENCOUNTER — Other Ambulatory Visit: Payer: Self-pay

## 2020-11-18 ENCOUNTER — Ambulatory Visit (INDEPENDENT_AMBULATORY_CARE_PROVIDER_SITE_OTHER): Payer: 59 | Admitting: VASCULAR SURGERY

## 2020-11-18 VITALS — BP 150/74 | HR 86 | Resp 18 | Ht 67.0 in | Wt 271.0 lb

## 2020-11-18 DIAGNOSIS — N186 End stage renal disease: Secondary | ICD-10-CM

## 2020-11-18 NOTE — Progress Notes (Signed)
Department of Vascular Surgery  [facility] History and Physical      Date: 11/18/2020  Patient: [name]  DOB: 1966/07/26  PCP: Ocie Doyne, NP    Chief Complaint:   Follow up after left arteriovenous fistula elevation    Subjective:     HPI: [name] is a 55 y.o. White female who presents after creation of LUE brachiocephalic AV fistula on 96/78/93 and subsequent fistula elevation on 10/27/2020.  She returns today for her follow-up appointment.  She overall was doing well and is eager to go back to work.     She denies chest pain, shortness of breath, abdominal pain, nausea, vomiting, amaurosis fugax, slurred speech, weakness in 1 arm or 1 leg that comes and goes, fevers, chills, intermittent claudication.    PMH:   Past Medical History:   Diagnosis Date    Anxiety     Arthritis     hips    Beta blocker prescribed for left ventricular systolic dysfunction     Labetalol     Cancer (CMS [place]) 2019, 2021    kidney, 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 body habitus;  Left vent. EF est. 55-60%; mildy calcified aortic vlave leaflets-especially right coroanry cusp; Small mobile density on aortic valve-no aortic valve stenosis or regurg; Concentric remodeling; mild MR; Trace AR; Trileaflet aortic vlave; Nl IVC size wiht <50% insp. collapse;       Hyperlipidemia     Hyperparathyroidism (CMS [place])     Hyperthyroidism     Old records states hyperthyroidism, patient denies, states has hyperparathyroidism    Irritable bowel syndrome     Macular edema     Morbid obesity (CMS HCC)     Obesity     Panic attack     Peripheral edema     ankles/feet    Peripheral neuropathy     bilateral feet    Pneumonia 07/2019; 09/2020    COVID Pneumonia 07/2019; fluid overload/pneumonia with recent hosp. 09/2020    Shortness of breath     SOB after climbing 8 steps     Type 2 diabetes mellitus (CMS HCC)     Wears glasses     reading    White coat hypertension  Family Hx:  Family Medical History:     Problem Relation (Age of Onset)    Breast Cancer Paternal Aunt    Cancer Maternal Grandmother    Diabetes Father, Paternal Aunt    High Cholesterol Maternal Uncle, Mother    Hypertension (High Blood Pressure) Maternal Uncle, Father              Medications:  Current Outpatient Medications   Medication Sig Dispense Refill    albuterol sulfate (PROVENTIL OR VENTOLIN OR PROAIR) 90 mcg/actuation Inhalation HFA Aerosol Inhaler Take 2 Puffs by inhalation Every 4 hours as needed 2 Inhaler 0    ALPRAZolam (XANAX) 0.25 mg Oral Tablet Take 0.25 mg by mouth Once per day as needed      atorvastatin (LIPITOR) 20 mg Oral Tablet Take 20 mg by mouth Every evening      calcitrioL (ROCALTROL) 0.25 mcg Oral Capsule Take 0.25 mcg by mouth Once a day      escitalopram oxalate (LEXAPRO) 10 mg Oral Tablet Take 10 mg by mouth Every night      Fish Oil-Omega-3 Fatty Acids 360-1,200 mg Oral Capsule Take 2 Caps by mouth Twice daily      fluticasone (FLONASE) 50 mcg/actuation Nasal Spray, Suspension Administer  1 Spray into each nostril Once per day as needed      JANUVIA 50 mg Oral Tablet Once a day      labetaloL (NORMODYNE) 100 mg Oral Tablet Take 0.5 Tablets (50 mg total) by mouth Twice daily for 14 days Indications: Hold for SBP<120 or HR<55 14 Tablet 0    loperamide (IMODIUM) 2 mg Oral Capsule Take 2 mg by mouth Every 4 hours as needed      loratadine (CLARITIN) 10 mg Oral Tablet Take 10 mg by mouth      losartan (COZAAR) 25 mg Oral Tablet Take 1 Tablet (25 mg total) by mouth Once a day for 30 days 30 Tablet 0    traMADoL (ULTRAM) 50 mg Oral Tablet Take 1 Tablet (50 mg total) by mouth Every 6 hours as needed for Pain 30 Tablet 0     No current facility-administered medications for this visit.       Allergies:  Allergies   Allergen Reactions    Vicodin [Hydrocodone-Acetaminophen] Itching       Past Surgical Hx:  Past Surgical History:   Procedure Laterality Date    AV FISTULA PLACEMENT Left 08/11/2020    LUE brachio cephalic fistula creation    ECTOPIC PREGNANCY SURGERY  05/24/1998    HX CATARACT REMOVAL Bilateral 2016    HX CHOLECYSTECTOMY  09/10/96    HX HAND SURGERY Right 2017    Trigger finger release 3rd finger    HX OTHER Right 10/18/2020    tunneled dialysis catheter    HX PELVIC LAPAROSCOPY  05/24/98    Ectopic pregnancy    HX SHOULDER SURGERY Left 2017    Procedure for frozen shoulder    HX TUBAL LIGATION  09/10/2012    Filshie Clips    HYMENECTOMY  1996    KIDNEY SURGERY  10/24/17; 03/24/20    LAPAROSCOPIC NEPHRECTOMY, HAND ASSISTED Left 03/24/2020    Radical    LAPAROSCOPIC PARTIAL NEPHRECTOMY Left 10/24/2017    hand-assisted    RENAL BIOPSY, PERCUTANEOUS  03/08/2020    clear cell renal cell carcinoma           Social Hx:  Social History     Socioeconomic History    Marital status: Married  Spouse name: Not on file    Number of children: Not on file    Years of education: Not on file    Highest education level: Not on file   Occupational History    Not on file   Tobacco Use     Smoking status: Never Smoker    Smokeless tobacco: Never Used   Vaping Use    Vaping Use: Never used   Substance and Sexual Activity    Alcohol use: No    Drug use: No    Sexual activity: Yes     Partners: Male     Birth control/protection: Surgical   Other Topics Concern    Ability to Walk 1 Flight of Steps without SOB/CP Not Asked    Routine Exercise Not Asked    Ability to Walk 2 Flight of Steps without SOB/CP Not Asked    Unable to Ambulate Not Asked    Total Care Not Asked    Ability To Do Own ADL's Yes    Uses Walker Not Asked    Other Activity Level Yes     Comment: SOB after climbing approx. 8 steps, sedentary since recent hosp. for fluid overload and CKD    Uses Cane Not Asked   Social History Narrative    Not on file     Social Determinants of Health     Financial Resource Strain: Not on file   Food Insecurity: Not on file   Transportation Needs: Not on file   Physical Activity: Not on file   Stress: Not on file   Intimate Partner Violence: Not on file   Housing Stability: Not on file       Review Of Systems:  As per HPI, all other systems reviewed and are negative    Objective:  Physical Exam:     Constitutional:   BP (!) 150/74    Pulse 86    Resp 18    Ht 1.702 m (5\' 7" )    Wt 123 kg (271 lb)    LMP 10/24/2014 (Approximate)    SpO2 94%    BMI 42.44 kg/m       Appears in good health and no distress  Eyes:   Conjunctiva clear, eyelids normal.   Pupils equal, round, and reactive to light and accomodation.   ENT:   Ears and nose have normal overall appearance with no lesions or masses.    Lips, teeth and gums have normal overall appearance  Neck:   Supple, symmetrical, trachea midline, no visible masses, no crepitus  Thyroid without visible enlargement, without palpable masses and non-tender  Respiratory:   Non-labored respirations, symmetrical chest expansion, no involvement of accessory muscles  Palpation of chest with symmetrical tactile fremitus, no tenderness and no crepitus    Cardiovascular:    Right radial artery: 2+ (normal), Left radial artery: 2+ (normal)  Left brachiocephalic fistula with excellent thrill. 2nd Incision well healed.  Abdomen:   Soft, non-tender, non-distended, no palpable masses  No palpable enlargement of liver or spleen  Neurologic:   Cranial nerves II - XII grossly intact  Strength 5/5 in all extremities   Sensation to light touch is intact and symmetrical to face, upper and lower extremities  Psychiatric:   Patient is oriented to time, place and person   Mood: Stable mood and affect       IMAGING/LAB RESULTS:   N/A    Assessment/Plan:  Chronic kidney disease, stage V - s/p LUE BC AVF creation  on 08/11/20 with subsequent fistula elevation on 10/27/2020.  She has recovered well after her 2nd surgery and her incision is well healed.  I would recommend waiting 3 more weeks before attempting to access the fistula to allow for more scarring around the vein. I told the patient I would be happy to take care of her fistula needs in the future.    Patient was given the opportunity to ask questions and those questions were answered to their satisfaction. Instructed to call with any further questions or concerns.       Kaylyn Lim, MD

## 2020-11-19 ENCOUNTER — Telehealth (HOSPITAL_COMMUNITY): Payer: Self-pay

## 2020-11-19 DIAGNOSIS — N186 End stage renal disease: Secondary | ICD-10-CM

## 2020-11-19 NOTE — Telephone Encounter (Signed)
Call attempt to patient to discuss scheduling of appointment with clinic. Was unable to speak with the patient and was prompted to leave a voicemail with office hours and office phone number for return callback.

## 2020-11-19 NOTE — Telephone Encounter (Signed)
Called referring [facility] to obtain 2728. Per Amy the 2728 has not been completed as she just started there on 10/22/20. Stated we would follow up to obtain when it was complete and ready.

## 2020-11-19 NOTE — Telephone Encounter (Signed)
Received initial referral for txp from Dr. Conan Bowens

## 2020-11-22 NOTE — Telephone Encounter (Signed)
Patient returned call to schedule phone education class. Was able to have patient scheduled on 12/02/20 at 2pm with Lollie Marrow. Confirmed best phone and address with patient.

## 2020-11-22 NOTE — Telephone Encounter (Signed)
Call attempt to patient to discuss scheduling of appointment with clinic. Was unable to speak with the patient and was prompted to leave a voicemail with office hours and office phone number for return callback.

## 2020-11-25 ENCOUNTER — Telehealth (RURAL_HEALTH_CENTER): Payer: Self-pay

## 2020-11-25 NOTE — Telephone Encounter (Signed)
Advised patient that you recommend since her BP is going up that she touch base with Nephrology in the even that she needs to have more fluid taken off or may possibly be infection. Patient verbalized understanding and will give them a call.

## 2020-11-25 NOTE — Telephone Encounter (Signed)
If her B/p is going up she needs to touch base with nephrology in the event she needs to have more fluid taken off or may possible have an infection

## 2020-11-25 NOTE — Telephone Encounter (Signed)
Patient called stating that she had to get her daughter to come pick her up from work today. She just recently had fistula surgery and is on dialysis. She is very tired and doesn't feel the best. Her BP this morning was 118/81 and when she got home it was 139/80. Patient would like to know if you have any suggestions, or if she should make an appt to be seen?

## 2020-11-30 ENCOUNTER — Ambulatory Visit: Payer: 59 | Attending: Gerontology | Admitting: Gerontology

## 2020-11-30 ENCOUNTER — Encounter (RURAL_HEALTH_CENTER): Payer: Self-pay | Admitting: Gerontology

## 2020-11-30 VITALS — BP 124/74 | HR 86 | Temp 97.6°F | Resp 16 | Ht 67.0 in | Wt 273.0 lb

## 2020-11-30 DIAGNOSIS — Z992 Dependence on renal dialysis: Secondary | ICD-10-CM

## 2020-11-30 DIAGNOSIS — I10 Essential (primary) hypertension: Secondary | ICD-10-CM

## 2020-11-30 DIAGNOSIS — N186 End stage renal disease: Secondary | ICD-10-CM

## 2020-11-30 DIAGNOSIS — E1121 Type 2 diabetes mellitus with diabetic nephropathy: Secondary | ICD-10-CM

## 2020-11-30 NOTE — Progress Notes (Signed)
Subjective:     Patient ID: Kelli Carlson is a 55 y.o. female.  Chief Complaint   Patient presents with   . Chronic Kidney Disease     follow up       HPI: She was having issues with the B/P med, she did speak with Dr Judeth Horn and they made adjustments to meds, yesterday had dialysis and had some muscle cramping, she did talk to the transplant team in [place]    Adjustment to B/P meds    No change in care for diabetes, stable, is losing weight    The following portions of the patient's history were reviewed and updated as appropriate: allergies, current medications, past family history, past medical history, past social history, past surgical history and problem list.    Social History     Socioeconomic History   . Marital status: Married   Tobacco Use   . Smoking status: Never Smoker   . Smokeless tobacco: Never Used   Vaping Use   . Vaping Use: Never used   Substance and Sexual Activity   . Alcohol use: No     Alcohol/week: 0.0 standard drinks   . Drug use: No   . Sexual activity: Yes     Review of Systems   Constitutional: Positive for fatigue. Negative for fever.   HENT: Negative.    Respiratory: Negative.    Cardiovascular: Negative.    Endocrine: Negative for polydipsia, polyphagia and polyuria.   Musculoskeletal: Negative.    Skin:        Shunt area healing for dialysis   Psychiatric/Behavioral: Positive for dysphoric mood. The patient is nervous/anxious.                        Objective:    Physical Exam  Vitals and nursing note reviewed.   Constitutional:       Appearance: Normal appearance.   Cardiovascular:      Rate and Rhythm: Normal rate.   Pulmonary:      Effort: Pulmonary effort is normal.   Neurological:      Mental Status: She is alert.   Psychiatric:         Mood and Affect: Mood normal.       Blood pressure 124/74, pulse 86, temperature 97.6 F (36.4 C), temperature source Temporal, resp. rate 16, height 1.702 m ('5\' 7"'$ ), weight 123.8 kg (273 lb).        Wt Readings from Last 4 Encounters:   11/30/20  123.8 kg (273 lb)   11/01/20 124.8 kg (275 lb 1.6 oz)   09/28/20 129.7 kg (286 lb)   09/24/20 129.5 kg (285 lb 7.9 oz)       Medical Decision Making:   1. Type 2 diabetes mellitus with diabetic nephropathy, without long-term current use of insulin  Continue Januvia, and follow blood sugar at home  2. Essential hypertension  - LABETALOL HCL PO; Take 50 mg by mouth daily '50mg'$  in evening, '25mg'$  in morning    3. CKD (chronic kidney disease) requiring chronic dialysis  Is currently being assessed for possible transplant, continue care with nephrology    Return in about 4 weeks (around 12/28/2020).      Follow up sooner if no improvement or if symptoms worsen or persist.   Medication dosing instructions, drug-drug interactions and potential side effects discussed in detail.   Failure to comply with plan of care discussed with patient. Importance of adhering to follow up office  visits discussed in detail.   Potential risks of noncompliance discussed with patient.    Ocie Doyne, NP

## 2020-12-02 ENCOUNTER — Telehealth (HOSPITAL_COMMUNITY): Payer: Self-pay

## 2020-12-02 ENCOUNTER — Ambulatory Visit: Payer: 59 | Attending: Nephrology

## 2020-12-02 NOTE — Telephone Encounter (Signed)
Kidney Transplant Pre-Screening/Informational Session  Telephone Education     [name] participated in a kidney transplant pre-screen and informational session class on 12/02/2020 via the phone.      [id number]  Age: 55  ABO: O+  EPTS:   BMI: 42.44 (57? 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)   LVSD - labetalol   Cellulitis - left foot - 10/24/2014 & 10/21/2020   COVID 07/2019   HTN   Fatty Liver   Fluid overload in 09/2020   Hyperlipidemia   Hyperparathyroidism   IBS   Peripheral neuropathy   Activity intolerance   Supplemental O2 use: No   Open wounds/active infections: No    Surgical history:   10/27/2020 Revision of LUE BC AVF without thrombectomy    08/11/2020 AVF - LUE   05/24/1998 pelvic laparoscopy/ectopic pregnancy   2016 Cataract removal - OU   09/10/1996 Cholecystectomy   2017 Trigger finger - 3rd finger - right   10/18/2020 tunneled dialysis catheter   09/10/2012 tubal ligation   1996 hymenectomy   10/24/2017 - left laparoscopic partial nephrectomy (hand assisted)  o Path: A.  KIDNEY, FAT OVERLYING TUMOR, BIOPSY:   -     No evidence of malignancy.   -     Benign adipose tissue.       B.  KIDNEY, LEFT TUMOR, PARTIAL NEPHRECTOMY:   -     Clear cell renal cell carcinoma, grade 2 (see comment).       03/24/2020 - left laparoscopic radical nephrectomy (hand assisted)  o Path:   Recurrent clear cell  renal cell carcinoma   03/08/2020 renal biopsy -   o Path     Social history:   Caregiver: Yes   Reliable transportation: Yes   Ambulation needs: No   Potential donors: Yes - daughter & 2 friends   Adherence to current treatment: Yes  o Number of missed dialysis treatments in the last 30 days: 0   Substance abuse concerns: No   Smoker: No - non-smoker   Tobacco use: No = never used    Testing:   03/31/2020 CT Abdomen/Pelvis    IMPRESSION:  1. Small postoperative hematomas in the left renal surgical bed.  2. Small amount of blood in the left abdominal wall subcutaneous tissues  and abdominal wall.   10/17/2020 TTE    Conclusions:  Technically difficult due to patient body habitus.  The left ventricular ejection fraction by visual assessment is estimated to be 55-60%.  Mildly calcified aortic valve leaflets --especially the right coronary cusp.  There is a small mobile density on the aortic valve seen mainly in parasternal long axis views. No aortic valve stenosis or regurgitation.  The study images were of technically adequate quality.  Transthoracic complete echo with contrast, 2D, spectral and tissue Doppler,  color flow Doppler, M-mode.  Concentric remodeling.  Left ventricular diastolic parameters are normal.  The mitral valve is not well visualized.  No evidence of mitral stenosis.  There is mild mitral regurgitation.  Trace aortic regurgitation present.  The pulmonic valve is normal.  No significant pulmonic valve regurgitation present.  Trileaflet aortic valve.  There is a small mobile density on the aortic valve.  The interatrial septum is normal in appearance.  Normal IVC size with <50% inspiratory collapse (estimated RA pressure: 8 mmHg).  The aortic root is of normal size.  Normal pericardium with no pericardial effusion.        Discussion and pre-transplant education included the following information:     Evaluation process, risks and benefits of kidney evaluation   Selection committee process and  outcomes   Kidney transplant surgery    Risks and benefits of kidney transplantation   Alternative treatments   Post-transplant care requirements   Information on kidney donor types, wait listing, living donation   Resources for more information    Patient verbalized understanding of education provided. Patient had the opportunity to ask questions and have her questions answered.  Reviewed medical history as noted above.  Patient wishes to proceed with evaluation. Next steps in evaluation process were discussed.  Advised our financial coordinator will reach out to her insurance company for authorization for evaluation.  Patient scheduled for evaluation on 01/06/2021 pending insurance authorization.  Advised patient to please bring a list of all prescriptions, including over the counter medications and supplements as well as insurance cards to the evaluation appointment.     The pre-kidney educational binder will be mailed to patient's home by the end of the week. Encouraged to contact the office if any additional questions arise.  Office contact information provided.   No questions or concerns at this time.

## 2020-12-02 NOTE — Telephone Encounter (Signed)
Evaluation scheduled for 01/06/21 per Lollie Marrow.

## 2020-12-07 ENCOUNTER — Telehealth (HOSPITAL_COMMUNITY): Payer: Self-pay

## 2020-12-07 NOTE — Telephone Encounter (Signed)
Patient called in requesting to change date of evaluation. Patient stated she has to work and is unable to take that time off. Was able to find a time next Tuesday available for the patient to reschedule. Patient agreed to 01/11/21 at 8am.

## 2020-12-07 NOTE — Telephone Encounter (Signed)
Kidney Transplant Pre-Transplant Education binder sent via [employer] on 12/03/2020 with anticipated delivery on Monday, December 06, 2020 with Tracking # [id number] [phone] [id number] [phone]

## 2021-01-04 ENCOUNTER — Encounter (RURAL_HEALTH_CENTER): Payer: Self-pay

## 2021-01-04 ENCOUNTER — Ambulatory Visit: Payer: 59 | Attending: Gerontology | Admitting: Gerontology

## 2021-01-04 ENCOUNTER — Encounter (RURAL_HEALTH_CENTER): Payer: Self-pay | Admitting: Gerontology

## 2021-01-04 ENCOUNTER — Other Ambulatory Visit (HOSPITAL_COMMUNITY): Payer: Self-pay | Admitting: Nephrology

## 2021-01-04 ENCOUNTER — Other Ambulatory Visit (HOSPITAL_COMMUNITY): Payer: Self-pay

## 2021-01-04 DIAGNOSIS — E1121 Type 2 diabetes mellitus with diabetic nephropathy: Secondary | ICD-10-CM

## 2021-01-04 DIAGNOSIS — Z4901 Encounter for fitting and adjustment of extracorporeal dialysis catheter: Secondary | ICD-10-CM

## 2021-01-04 DIAGNOSIS — I10 Essential (primary) hypertension: Secondary | ICD-10-CM

## 2021-01-04 DIAGNOSIS — Z992 Dependence on renal dialysis: Secondary | ICD-10-CM

## 2021-01-04 DIAGNOSIS — N186 End stage renal disease: Secondary | ICD-10-CM

## 2021-01-04 DIAGNOSIS — Z1231 Encounter for screening mammogram for malignant neoplasm of breast: Secondary | ICD-10-CM

## 2021-01-04 MED ORDER — TRAMADOL HCL 50 MG PO TABS
50.0000 mg | ORAL_TABLET | Freq: Three times a day (TID) | ORAL | 2 refills | Status: DC | PRN
Start: 2021-01-04 — End: 2021-04-08

## 2021-01-04 MED ORDER — CYCLOBENZAPRINE HCL 5 MG PO TABS
5.0000 mg | ORAL_TABLET | Freq: Three times a day (TID) | ORAL | 1 refills | Status: DC | PRN
Start: 2021-01-04 — End: 2021-02-10

## 2021-01-04 NOTE — Progress Notes (Signed)
Patients Tramadol required Prior Auth. I have submitted via CoverMyMeds.    01/05/21- not accepted because pharmacy ran through old insurance. I have resubmitted to [facility] via manual fax.    01/07/21- Rx approved from 01/06/21-01/05/22. Pharmacy notified.

## 2021-01-04 NOTE — Progress Notes (Signed)
Subjective:     Patient ID: Kelli Carlson is a 55 y.o. female.  Chief Complaint   Patient presents with   . Hospital Follow-up     Fluid overload   . Diabetes   . Fatigue       HPI: Had dialysis yesterday, nephrology feels she is stable. Does have some cramping after therapy and has had fluid replenished.   Her blood sugar is well controlled.  She has started taking Thursdays off work related to fatigue, her sugar has been well controlled    The following portions of the patient's history were reviewed and updated as appropriate: allergies, current medications, past family history, past medical history, past social history, past surgical history and problem list.    Social History     Socioeconomic History   . Marital status: Married   Tobacco Use   . Smoking status: Never Smoker   . Smokeless tobacco: Never Used   Vaping Use   . Vaping Use: Never used   Substance and Sexual Activity   . Alcohol use: No     Alcohol/week: 0.0 standard drinks   . Drug use: No   . Sexual activity: Yes     Review of Systems   Constitutional: Positive for fatigue.   HENT: Negative.    Respiratory: Negative.    Cardiovascular: Negative.    Endocrine: Negative for polydipsia, polyphagia and polyuria.   Genitourinary:        Dialysis   Psychiatric/Behavioral: The patient is nervous/anxious.                        Objective:    Physical Exam  Vitals and nursing note reviewed.   Constitutional:       Appearance: Normal appearance.   HENT:      Head: Normocephalic.   Cardiovascular:      Rate and Rhythm: Normal rate.   Pulmonary:      Effort: Pulmonary effort is normal.   Skin:     Comments: Fistula to left upper arm has healed nicely   Neurological:      Mental Status: She is alert.       Blood pressure 142/70, pulse 80, temperature 97.5 F (36.4 C), temperature source Temporal, resp. rate 16, height 1.702 m ('5\' 7"'$ ), weight 124.5 kg (274 lb 8 oz).        Wt Readings from Last 4 Encounters:   01/04/21 124.5 kg (274 lb 8 oz)   11/30/20  123.8 kg (273 lb)   11/01/20 124.8 kg (275 lb 1.6 oz)   09/28/20 129.7 kg (286 lb)       Medical Decision Making:   1. Type 2 diabetes mellitus with diabetic nephropathy, without long-term current use of insulin  Continue Januvia 50 mg, last FBS is 144  2. Screening mammogram for high-risk patient  - Mammo Screening w/Tomo Bilateral; Future    3. Essential hypertension  BP Readings from Last 3 Encounters:   01/04/21 142/70   11/30/20 124/74   11/01/20 156/88   Losartan 50 mg po   Labetalol 50 daily    4. CKD (chronic kidney disease) requiring chronic dialysis  Follows with Dr Judeth Horn for Dialysis    Return in about 4 weeks (around 02/01/2021) for Well woman.      Follow up sooner if no improvement or if symptoms worsen or persist.   Medication dosing instructions, drug-drug interactions and potential side effects discussed in detail.  Failure to comply with plan of care discussed with patient. Importance of adhering to follow up office visits discussed in detail.   Potential risks of noncompliance discussed with patient.    Ocie Doyne, NP

## 2021-01-06 ENCOUNTER — Ambulatory Visit (HOSPITAL_COMMUNITY): Payer: Self-pay

## 2021-01-07 ENCOUNTER — Telehealth (HOSPITAL_COMMUNITY): Payer: Self-pay

## 2021-01-07 NOTE — Telephone Encounter (Signed)
Patient was calling in to speak with the MSW on staff regarding information about hotels and such if she was to come Monday evening to stay before her appointment with [place] on Tuesday. Was unable to connect patient with MSW at this time, and she requested a return call once available.

## 2021-01-07 NOTE — Telephone Encounter (Signed)
Spoke with Pt via phone per she is interested in lodging accommodations for the night before her appointment. Reviewed options and ageed that referral would be made for [name]. Provided her with the number for [facility] and educated her that they would need to call Monday morning to check room availlability. Also discussed that per financial counselor that her insurance does have reimbursable travel and lodging. Pt was unaware but glad as last resort to put out the money that i could be reimbursed. Referral placed via connect to [facility]

## 2021-01-09 ENCOUNTER — Encounter (RURAL_HEALTH_CENTER): Payer: Self-pay | Admitting: Gerontology

## 2021-01-10 ENCOUNTER — Other Ambulatory Visit: Payer: Self-pay

## 2021-01-10 ENCOUNTER — Telehealth (HOSPITAL_COMMUNITY): Payer: Self-pay

## 2021-01-10 ENCOUNTER — Ambulatory Visit
Admission: RE | Admit: 2021-01-10 | Discharge: 2021-01-10 | Disposition: A | Discharge status: Home / Self Care | Payer: 59 | Source: Ambulatory Visit | Attending: Nephrology | Admitting: Nephrology

## 2021-01-10 DIAGNOSIS — Z4901 Encounter for fitting and adjustment of extracorporeal dialysis catheter: Secondary | ICD-10-CM | POA: Insufficient documentation

## 2021-01-10 DIAGNOSIS — Z452 Encounter for adjustment and management of vascular access device: Secondary | ICD-10-CM

## 2021-01-10 MED ORDER — BACITRACIN 500 UNIT/GRAM TOPICAL PACKET
PACK | Freq: Once | CUTANEOUS | Status: AC | PRN
Start: 2021-01-10 — End: 2021-01-10
  Administered 2021-01-10: 1 via TOPICAL

## 2021-01-10 MED ORDER — LIDOCAINE 1 %-EPINEPHRINE 1:100,000 INJECTION SOLUTION
Freq: Once | INTRAMUSCULAR | Status: AC | PRN
Start: 2021-01-10 — End: 2021-01-10
  Administered 2021-01-10: 15 mL via INTRADERMAL

## 2021-01-10 MED ORDER — LIDOCAINE 1 %-EPINEPHRINE 1:100,000 INJECTION SOLUTION
INTRAMUSCULAR | Status: AC
Start: 2021-01-10 — End: 2021-01-10
  Filled 2021-01-10: qty 50

## 2021-01-10 NOTE — Telephone Encounter (Signed)
Patient scheduled for evaluation appointment on 01/10/2021.  Appointment reminder call placed to patient. Completed and reviewed the following information:       COVID-19 Health Screen:        1. Have you experienced any of the following symptoms:   Fever or chills: No   Cough: No   Shortness of breath or difficulty breathing: No   Fatigue: No   Muscle or body aches: No   Headache: No   New loss of taste or smell: No   Sore throat: No   Congestion or runny nose: No   Nausea or vomiting: No   Diarrhea: No    2.  Within the past (14) days have you been in close  physical contact (6 feet or closer for at least 15 minuets) with a person who is known to have laboratory confirmed COVID-19 or anyone who has any symptoms consistent with COVID-19: No    3. Are you isolating or quarantining because you may have been exposed to a person with COVID-19 or worried that you may be sick with COVID-19:  No    4. Are you currently waiting on the results of a COVID-19 test: No       Fall Risk Screen:     1. Have you fallen in the last 6 months: Yes; 6 months ago - tripped over dog  2.    Do you have or use a cane, walker or crutches for assistance with walking: No  3.    Would you need assistance with walking while here for the evaluation visit: No      COVID-19 Clinic Restrictions:     ? Only 1 support person can accompany you to your clinic appointment.  ? Recommend self-parking in the parking lot directly in front of [facility].   ? Do not arrive for your clinic appointment until 7:45am. If you arrive before 7:45am,   you will need to wait in your car until this appointment time.   ? Must wear face covering/mask.  ? Enter through main door at [facility].   ? At 7:45am come directly to the clinic behind [facility] on first floor of [place].  ? Visitors are to stay in the patient's exam room at all times and may not wait in the waiting area or travel to common areas such as cafeteria, gift shop, etc.  ? You may bring your own beverage or snack if  needed.      COVID-19 and fall risk screens completed.  Understanding verbalized of COVID-19 clinic restrictions. Reminded patient to bring a list of all medications, including over the counter medications and supplements,  and insurance cards to clinic visit.   No questions at this time with regard to above noted.

## 2021-01-10 NOTE — Telephone Encounter (Signed)
Patient scheduled for evaluation appointment on 01/11/2021.  Appointment reminder call placed to patient. I attempted calling patient on only number listed, no answer, left detailed vm providing office number and hours for call back:       COVID-19 Health Screen:        1. Have you experienced any of the following symptoms:   Fever or chills:  N/A   Cough: N/A   Shortness of breath or difficulty breathing:N/A   Fatigue:N/A   Muscle or body aches: N/A   Headache: N/A   New loss of taste or smell: N/A   Sore throat:N/A   Congestion or runny nose: N/A   Nausea or vomiting: N/A   Diarrhea: N/A    2.  Within the past (14) days have you been in close  physical contact (6 feet or closer for at least 15 minuets) with a person who is known to have laboratory confirmed COVID-19 or anyone who has any symptoms consistent with COVID-19:  N/A    3. Are you isolating or quarantining because you may have been exposed to a person with COVID-19 or worried that you may be sick with COVID-19:  N/A    4. Are you currently waiting on the results of a COVID-19 test:  N/A       Fall Risk Screen:     1. Have you fallen in the last 6 months:N/A  2.    Do you have or use a cane, walker or crutches for assistance with walking: N/A  3.    Would you need assistance with walking while here for the evaluation visit: N/A      COVID-19 Clinic Restrictions:     ? Only 1 support person can accompany you to your clinic appointment.  ? Recommend self-parking in the parking lot directly in front of [facility].   ? Do not arrive for your clinic appointment until 7:45am. If you arrive before 7:45am,   you will need to wait in your car until this appointment time.   ? Must wear face covering/mask.  ? Enter through main door at [facility].   ? At 7:15am come directly to the clinic behind [facility] on first floor of [place].  ? Visitors are to stay in the patient's exam room at all times and may not wait in the waiting area or travel to common areas such as cafeteria, gift  shop, etc.  ? You may bring your own beverage or snack if needed.

## 2021-01-11 ENCOUNTER — Ambulatory Visit: Payer: 59 | Attending: Nephrology | Admitting: Transplant Surgery

## 2021-01-11 ENCOUNTER — Encounter (HOSPITAL_COMMUNITY): Payer: Self-pay | Admitting: Transplant Surgery

## 2021-01-11 DIAGNOSIS — N186 End stage renal disease: Secondary | ICD-10-CM | POA: Insufficient documentation

## 2021-01-11 DIAGNOSIS — C649 Malignant neoplasm of unspecified kidney, except renal pelvis: Secondary | ICD-10-CM | POA: Insufficient documentation

## 2021-01-11 DIAGNOSIS — Z6841 Body Mass Index (BMI) 40.0 and over, adult: Secondary | ICD-10-CM

## 2021-01-11 DIAGNOSIS — Z992 Dependence on renal dialysis: Secondary | ICD-10-CM

## 2021-01-11 DIAGNOSIS — C642 Malignant neoplasm of left kidney, except renal pelvis: Secondary | ICD-10-CM

## 2021-01-11 DIAGNOSIS — Z01818 Encounter for other preprocedural examination: Secondary | ICD-10-CM | POA: Insufficient documentation

## 2021-01-11 NOTE — Patient Instructions (Addendum)
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  We are excited about moving forward with your evaluation with a goal of getting you listed and transplanted!  Today, you met with the following members of the transplant team:  Tx Coordinator: Diona Browner, RN  Tx Surgeon: Dr. Inda Coke  Tx Nephrologist: Dr. Despina Hick  Tx Dietitian: n/a  Tx Financial Coordinator: Gala Murdoch  Tx Social Worker: Harless Nakayama  Tx Pharmacist: Selinda Eon  To be considered for listing and transplantation, we will need the items listed below (these items may consists of testing, consultations, and or recommendations from the members of the transplant team).  We recommend that tests and consultations are arranged at a [facility] facility.  Our transplant service representative will work with you to schedule these items.  The transplant service representatives can be reached at [phone].  If you prefer to have any testing done closer to home, please discuss this with your transplant coordinator.   In order to move forward with your evaluation, we would ask that you have the below items scheduled and completed:    - Follow up/re-establish with Oncology  - Follow up with CT Chest that was previously ordered  - Have your PCP send a referral to Psychiatry  - It is recommended by the Surgeon to lose 25 pounds   Here is a list of recommendations from the transplant team:  Financial Coordinator Recommendations:   Social Work Recommendations:     We will send a letter to your referring doctor and dialysis unit (if applicable) listing the items required to complete the evaluation process.    You can see your note(s) in [facility].  It is common for you to encounter certain medical terminology which may be unfamiliar to you.    You might see results before the transplant coordinator, so please give at least 2 business days for review.  Please have this understanding that 'NOT' all  abnormal results are significant.  Our office will contact you for any urgent or emergent action if necessary.  Once all items are received and reviewed, your chart will be presented at our patient selection committee meeting.  You will be notified of the outcome of this meeting by phone call and by letter.    We encourage you to return home and discuss the option of living donation with family members, friends, and others you may know.  Anyone who is interested or has questions may contact the living donor coordinator: Macky Lower, RN at [phone].  As you work through the evaluation process, we ask that you take time and consider organ donation.  Are you an organ donor?  Even though you are in need of a transplant, there is still the possibility to donate other organs, corneas, and/or tissue.  Are your family and friends organ donors?  Please take this opportunity to discuss organ donation with friends and family.  You can register to be an organ donor when obtaining or renewing your driver's license, at any time using the following website: [email or web address], or you could register today with the transplant coordinator.  If you have any further questions regarding the evaluation process, you may call your transplant coordinator at [phone].

## 2021-01-11 NOTE — Progress Notes (Signed)
[facility]  [address], [address]  [place], [place]  [address]  [phone]  Operated by [facility]      Patient Name:  [name]  MRN:  [id number]  DOB:  01-24-1966    Date of Service:  01/11/2021    Transplant Pharmacist: Pre-Transplant Evaluation Note    Subjective/Objective:  Rozann Holts is a 55 y.o. female who is being evaluated for kidney transplant.  I am seeing the patient, patient's daughter [name]), and patient's husband Linna Hoff) to review the patient's medication history, immunization history, and to evaluate patient's medication use.      Current Outpatient Medications:   .  albuterol sulfate (PROVENTIL OR VENTOLIN OR PROAIR) 90 mcg/actuation Inhalation HFA Aerosol Inhaler, Take 2 Puffs by inhalation Every 4 hours as needed, Disp: 2 Inhaler, Rfl: 0  .  ALPRAZolam (XANAX) 0.25 mg Oral Tablet, Take 0.25 mg by mouth Once per day as needed, Disp: , Rfl:   .  atorvastatin (LIPITOR) 20 mg Oral Tablet, Take 20 mg by mouth Every evening, Disp: , Rfl:   .  calcitrioL (ROCALTROL) 0.25 mcg Oral Capsule, Take 0.25 mcg by mouth Once a day (Patient not taking: Reported on 11/18/2020), Disp: , Rfl:   .  escitalopram oxalate (LEXAPRO) 10 mg Oral Tablet, Take 10 mg by mouth Every night, Disp: , Rfl:   .  Fish Oil-Omega-3 Fatty Acids 360-1,200 mg Oral Capsule, Take 2 Caps by mouth Twice daily, Disp: , Rfl:   .  fluticasone (FLONASE) 50 mcg/actuation Nasal Spray, Suspension, Administer 1 Spray into each nostril Once per day as needed, Disp: , Rfl:   .  JANUVIA 50 mg Oral Tablet, Once a day, Disp: , Rfl:   .  labetaloL (NORMODYNE) 100 mg Oral Tablet, Take 0.5 Tablets (50 mg total) by mouth Twice daily for 14 days Indications: Hold for SBP<120 or HR<55, Disp: 14 Tablet, Rfl: 0  .  loperamide (IMODIUM) 2 mg Oral Capsule, Take 2 mg by mouth Every 4 hours as needed, Disp: , Rfl:   .  loratadine (CLARITIN) 10 mg Oral Tablet, Take 10 mg by mouth, Disp: , Rfl:   .  losartan (COZAAR) 25 mg  Oral Tablet, Take 1 Tablet (25 mg total) by mouth Once a day for 30 days, Disp: 30 Tablet, Rfl: 0  .  traMADoL (ULTRAM) 50 mg Oral Tablet, Take 1 Tablet (50 mg total) by mouth Every 6 hours as needed for Pain, Disp: 30 Tablet, Rfl: 0  No current facility-administered medications for this visit.    Allergies   Allergen Reactions   . Vicodin [Hydrocodone-Acetaminophen] Itching       Immunization History   Administered Date(s) Administered   . Covid-19 Murvin Natal 10/09/2019, 11/05/2019, 10/28/2020   . Flu Family 07/15/2020   . HEPATITIS A VACCINE (ADMIN)-ADULT 02/25/2018, 11/01/2018   . HEPATITIS B VIRUS RECOMB VACCINE(ADMIN) 01/24/2018, 02/25/2018, 08/01/2018   . Influenza Vaccine, 6 month-adult 08/06/2017, 07/01/2018, 07/17/2019, 07/17/2020   . Tetanus Toxoid/Diphtheria Toxoid/Acellular Pertussis Vaccine, Adsorbed (Tdap) 09/28/2020       Pharmacy: [facility] [place])    Medication management assessment:   What kind of system do you use to keep track of your medications?  Pill boxes:  AM blue box, PM purple box, patient manages herself - Takes in AM when she first wakes up, takes in PM before bed, usually same time every day. Patient will call pharmacy for refills when week's supply left of medications. Does not  use alarms, states she just knows when to take her medications    Where do you store your medication? Counter by sink    Medication adherence assessment: From [place] Medication Adherence (MMA) scale  Do you sometimes forget to take your pills?  No    People sometimes miss taking their medications for reasons other than forgetting.  Thinking over the past two weeks, were there any days when you did not take your medicine?  No    Have you ever cut back or stopped taking your medicine without telling your doctor because you felt worse when you took it?  No  - talked to prescriber when gabapentin issues occurred    When you travel or leave home, do you sometimes forget to bring along your  medicine?  No    Did you take all your medicine yesterday?  Yes    When you feel like your symptoms are under control, do you sometimes stop taking your medicine?  No    Taking your medicine every day is a real inconvenience for people.  Do you ever feel hassled about sticking to your treatment plan?  No    How often do you have difficulty remembering to take all your medicine?  Never    MMA Scale: 0  Current adherence based off of MMA Scale: High adherence  - High adherence: Score 0  - Medium adherence: 1 - 2  - Low adherence: 3 - 8    Medication belief questions:   How often do you use a friend, family member, or co-worker's medications (maybe because you don't have yours available or they tell you how well their medications work)?  never    How often do you have difficulty affording your prescription medications (maybe because you can't afford the copay or it is not covered by insurance, etc)?  Occasional -- reports Januvia co-pay was more expensive than that of her other medications ($50), but she said this was somewhat doable for the 90-day supply of it     How often do you prefer using natural, herbal or homeopathic products over FDA approved medications or vaccines?  never     Other Issues (List any other issues that the patient may have with medications that were not listed previously)? none     Assessment/Recommendation:  Immunizations:    - Flu: reports she received at her school  - Pneumonia: open to receiving (reports never received any)  - Patient is eligible for Shingrix, pneumonia vaccines (PCV20, or PCV15 followed by PPSV23 a year later, depending on supply at dialysis).    Medications notes:  - Diabetes: Doesn't check blood glucose often; never been on insulin; never followed with Endocrinology  o Patient reports her A1c was 8.1% three months ago  - Vitamin E: for leg cramps  - Pain:  o Cyclobenzaprine: for sciatic nerve pain in right leg  o Ultram: maybe 2-3 tabs in week or two  - Calcitriol:  patient reports this was replaced with something else in her dialysis treatment  - Anxiety:  o Lexapro: reports she has taken 2 years PCP, reports it works really well  o Xanax usually 2-3 per week  - Hypertension: reports she checks her blood pressure 1-2 per week at home  o Losartan: only takes on non-dialysis days (Sun/Tues/Thurs/Sat)  o Labetalol: on Mon/Wed/Fri will only take in evening after dialysis     Drug interactions:   - No major drug interactions identified with potential post-transplant immunosuppression regimen.  Prescription monitoring program review:   - In the past year, patient dispensed 4 prescriptions (3 prescribers, 1 pharmacy) per [facility] PMP review. Dispensed alprazolam twice (#60 tablets, 30-day supplies); oxycodone once (#15 tablets, 3-day supply); tramadol once (#30 tablets, 7-day supply).    Adherence:         -    MMAS Score:  Score 0:  Patient illustrates high adherence to current medication regimen and is at low risk for non-adherence to transplant medications after transplant.  Patient is appropriate candidate for transplant from a pharmacy perspective.  Patient will benefit from in-depth medication teaching at time of transplant including importance of adherence to anti-rejection regimen.          Selinda Eon, PHARMD  01/11/2021, 10:33

## 2021-01-11 NOTE — Progress Notes (Signed)
[facility]  [address], [address]  [place], [place]  [address]  [phone]  Operated by [facility]          Patient Name:  [name]  MRN:  [id number]  DOB:  [birth year]  Age: 55 y.o.  Address: [place] [address]  County: [place]    Date of Service: 01/11/2021    Financial Evaluation Note  Transplant: Kidney  Primary Insurance: [employer]  Secondary Insurance: none  Dental Insurance: No  Vision Coverage: none  Prescription Drug/Pharmacy[facility] SP: [phone]  Discussions:    Reviewed all insurance benefit coverages, copayments, deductibles, out of pocket maximum, life time maximum, and transplant coverages on the financial benefit form. 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 Patient was given a pamphlet on the [employer], if accepted this will help with Deductibles and out of pocket expenses that will reset this year.   Discussed with patient any travel and lodging coverage.$5000.00 available with no mile requirement, her Case Manager has already supplied her with the paperwork needed for reimbursement.   The patient has Short term and Long term disability.    Medications are all $10 to $5, with Epclusa ( [place] not on formulary) costs at $1330.88 a copay card will be attainable for this commercially insured patient. PA required @ [phone]. 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 Patient was given a pamphlet on the [employer], if accepted this will help with Deductibles and out of pocket expenses that will reset this year.    2) Epclusa (  [place] not on formulary) costs at $1330.88 a copay card will be attainable for this commercially insured patient. PA required @ [phone]    3) Patient has travel and lodging coverage through [employer] $5000.00 available with no mile requirement    4) Listing and admission will need a prior authorization with  Altha Harm, Case Manager [phone] x [phone]     Recommendation:   From my perspective, this patient is a Fair candidate for transplant.  When Medicare is in place and acceptance through  the [facility], this will help alleviate financial anxiety        Gala Murdoch, Pharmacy Technician  01/11/2021, 10:03  Financial Resource Coordinator

## 2021-01-11 NOTE — Nursing Note (Signed)
[facility]  [address]  [place], [place]  [address]  [phone]  Operated by [facility]      Patient Name:  [name]  MRN:  [id number]  DOB:  12-18-1965    Date of Service:  01/11/2021    Nursing Pre-Transplant Evaluation Note       Review of Systems:  General: Negative for fevers, chills, edema, back/flank pain, fatigue  Neuro/Psych: Depression, Anxiety (Patient has a history of anxiety, well controlled with medication.)  ENT: Issues with vision (patient stated she has blurry vision at times.)  Card/Resp: Exertional dyspnea (Patient stated she gets short of breath when walking too much or too far.)  Do you know how to check your blood pressure: Yes  Do you have a blood pressure cuff at home: Yes  GI/GU: Diarrhea (Patient stated occasional diarrhea and constipation. Patient still makes a little urine.)  Do you have a scale at home: Yes  Integumentary: Negative for rashes, bumps/lumps           What activities are you doing at home: Completes ADLs. Works part time. Able to cook and clean if needed. Does light housework.  Initials: BB    Plan:  Clinic visit for initial transplant evaluation     Education:  The following information was discussed with the patient and their caregiver today during the transplant evaluation appointment.  At the conclusion of their visit the patient verbalized understanding of the following and did not have any further questions at this time.  The patient was given the transplant office contact number and name of the coordinator.  They were encouraged to call with any further questions or concerns and verbalized that they would do so.  Objective  Provide potential transplant candidates and their caregiver(s) information regarding the transplantation process from evaluation through post-transplantation.  Evaluation  . Purpose of evaluation is to determine if you meet criteria for transplant  . Overview of the transplant evaluation  process  . Understand diagnostic testing requirements  . Health maintenance and immunization requirements  . Insurance authorization required for evaluation  . At this point, you are not active on the organ waitlist  . Compliance/adherence to medical regimen is a condition for transplant candidacy  . Review Organ Transplant Consent form  . Understand The Types of Increased Risk Donor offers  . Caregiver(s) support requirement  . Financial responsibilities  Transplant Team  . Meet with all members of the Transplant Team and understand their role in the Transplant process (Transplant Physician, Transplant Surgeon, Social Worker, Software engineer, Dietitian, Museum/gallery curator)  . Understand when to communicate with the Transplant Team  o Change in insurance coverage, including prescription coverage  o Change in contact information and emergency contact information  o Antibiotic use, illness, infection, hospitalization, blood transfusion  o Caregiver issues  o Travel out of state or country  . How to reach the Transplant Team  Selection Committee  . Transplant Selection Committee consists of the Transplant Team  . Determines candidacy and provides recommendations for treatment  . Either Accepted, Deferred, or Declined for transplant  Waitlist  . What is the waitlist  . Insurance authorization required for waitlist activation  . Understand definition of Status while on the waitlist  . Need for additional medical support while on the waitlist  . Candidate must be accessible by phone 24 hours a day  . Acceptable timeline to travel to [place] when called for transplant Transportation arrangements  will be made ahead of time and discussed with transplant social worker  . What to expect when notified of an organ offer  [place] [employer] (UNOS)  . Discuss the role of [place] [employer] (UNOS)  o Contact information given  . UNOS multiple listing and waiting time transfer rights  explained  o Pamphlet provided  . Understand organ allocation criteria  Post-Transplantation  . Hospital Stay  o Expect to be in the hospital for at least 4-10 days  o Intensive Care Unit (Medical/Surgical)  o Step-Down Unit  o Rehabilitation  o Education  o Stay with 2 hours of the [facility] 2 for 14 days  . Anti rejection Medications  . Lifelong commitment  o Follow lab, clinic, and biopsy schedules  o Monitor labs, blood pressure, temperature, and daily weights  o Take medications exactly as prescribed  o Health maintenance and immunizations  o Be a proactive participant in your care    Follow-Up:  Discuss Multiple Listing?  Yes        Dillard Cannon, RN  01/11/2021, 11:39

## 2021-01-11 NOTE — Progress Notes (Signed)
[place]  [place], [address]  [place], [place]  [address]  [phone]  Operated by [facility]      Patient Name:  [name]  MRN:  [id number]  DOB:  [birth year]    Date of Service:  01/11/2021    Transplant Psychosocial Evaluation Assessment     Patient Information  Name: [name]  Organ for Transplant: Kidney      Maintenance Dialysis History      [facility] [place] M-W-F PM (1:30 - 5:30)          Current Dialysis [place]     Phone: [phone] Fax: [phone]    Address:  [place] [address]                       Dialysis Center MSW: [facility]     Phone:   Home Phone [phone]   Work Phone [phone]   Mobile [phone]     Address: [place] [address]  County: [place] Age: 55 y.o.  Sex: female   Race: White  Marital Status: Married    Where was the patient transferred/referred from?: Dr Judeth Horn and dialysis center   Who is the referring doctor: Marijo Sanes, MD    Care Team  PCP: Ocie Doyne, NP  Care Team     PCP     Name Type Specialty Phone Number    Ocie Doyne, NP Not available EXTERNAL [phone]          Care Team     No care team found            [phone]    Family History  Spouse/Significant Other: Yes.   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 Name: KAdiah Guereca Age: [age]Phone Number: [phone] Residence: lives with Pt   Number of Children: 0  Health: Good  Work: Part time MMusic therapistand full tImmunologistat [employer]an option: No  Has FMLA been used already: No  Relationship:  Good  Possible caregiver? Yes if not the donor   2. Name: MClarity Ciszek Age: [age]Phone Number: [phone] Residence: Lives at home  Number of Children: none   Health: Good  Work: Full time  [employer]management [employer]   Is FMLA an option: No  Has FMLA been used already: No  Relationship: Good  Possible caregiver? Yes  3. Name: DJaliah Foody Age: 89 Phone Number: [phone] [place]idence: [place]  Number of Children: none  Health: Good  Work: Full time [employer]  Is FMLA an option: No  Has FMLA been used already: No  Relationship: Good  Possible caregiver? Yes    How many siblings do you have?:  1. Name: RCarlis Stable Age: [age]Phone Number: [phone] [facility]  Number of Children: n/a  Health: GKermit Balo  Work: Full time Teacher, early years/pre  Is FMLA an option: No  Has FMLA been used already: No  Relationship: Good  Possible caregiver? Yes    Father's Name: Carlis Stable  Deceased? No  Age: 88  Residence (if living): [facility]      Phone (if living): [phone]  Relationship: Good  Possible caregiver (if living)?: No  Health Status: Poor    Mother's Name: Arlester Marker Deceased? No  Age: 73  Residence (if living): [place] Associate Professor (if living): [phone]  Relationship: Good  Possible caregiver (if living)?: Yes  Health Status: Good    Other Contact/Caregiver:  1. Name: Elwin Sleight  Age: 47 Phone Number: [phone]  Residence: [place]  Relation: good friend   Health: Good  Work: Other  Is FMLA an option: No  Has FMLA been used already: No  Relationship: Good  Possible caregiver? Yes    Reported Family Conflict/Issues: none     Any Current Stresses/Events (Besides Need for Transplant) in Your Life?: No    Living Situation/Arrangements  Who is currently living with patient?: Lives with spouse and children  Does the patient currently need assistance with daily activities?: No  Who provides the assistance currently?: None    Which caregivers are able to:  1. Stay with  patient the first month post-discharge: Mother and/or Father, Spouse/Sig Other and Child  2. Provide transportation for patient: Mother and/or Father, Spouse/Sig Other and Child  3. Provide assistance with medication management: Mother and/or Father, Spouse/Sig Other and Child    Caregiving Plan:  Overall Plan of Caregiving for Patient: Pt daughter will be primary if she is not the living donor with husband being back up and Pt mother the back up to him   Overall Plan of Caregiving for Small Children if Involved: n/a    Does the patient have reliable transportation?: Yes  How many people in household rely on this vehicle: 1  Are there alternate means of transport?: Yes, daughter and family   Distance from home to [facility]: 2 hours  Type of Home: Single level/ less than three steps no steps to enter   Running Water: Yes, Is it city water or well water? well  Electric Service: Yes   How do you heat your home?: Other heating oil  Are there times that access to and from your home is limited?: none   Distance to closest hospital?: 25 minutes  Name of Hospital: [facility] to closest pharmacy?: 25 minutes  Name of Pharmacy: [facility]   Distance to closest clinic?: n/a Name and Type of Clinic: n/a  How do you receive your mail: Home  Do you have a landline phone?: No  Do you have cell phone service at your home?: Yes  Do you have internet at home?: Yes  Where will you reside post transplant?: [facility] and  Or hotel    Distance of this residence from [facility]: 2 hours   If you are planning to stay at [facility], how will you cover those costs?: reimbursed from insurance     Education/Employment/Finances  Education Status  Highest Education Level: Bachelor's degree biblical studies [employer]   Speaks English: Yes/English Interpreter: not required   Reading Ability: 14 grade level  Cognitive Limitations: None    Employment Status  Date of last employment: currently working part time    Employment length: 10+  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 (scale 1-10, 10 being high stress): Stress Scale - 10    Medical bills stressing applied for medicaid Oct 21 denied   What type of income does the patient and family receive?: both salaries   Are you currently utilizing any community services?: No  Is there someone else who has access to your bank account: Yes  Do you have a DPOA (durable power of attorney for finance): No  Do you have a copy of last year's tax return: Yes    Insurance Information  Primary Insurance: Private/Group: Teacher, English as a foreign language   Secondary Insurance: None  Prescription Coverage: Private/Group: PEIA  Vision Coverage: Yes  Dental Coverage: Yes  Supplemental: No  Long-term Care Insurance: No  Have you ever lost Medicaid for any reason?: No    Advance Directives   MPOA: Yes.   Who is primary MPOA: Dan  Secondary: [name]  Is there a copy available: Yes  Living Will: No  Five Wishes: No  Copy obtained of any advance directives for patient [place] History/Coping with Illness/Compliance   Patient history of illness:   Past Medical History:   Diagnosis Date   . Anxiety    . Arthritis     hips   . Beta blocker prescribed for left ventricular systolic dysfunction     Labetalol    . 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 Cataract     S/P cataract extraction bilaterally    . Cellulitis 10/24/14    left foot   . Cellulitis 10/21/2020    LLE extremity 09/2020-resolved per patient    . Chronic pain     sec. to arthritis-hips   . 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 Constipation    . COVID 07/2019   . Depression    . Diabetes mellitus (CMS [place])    . Diabetes mellitus, type 2 (CMS HCC)    . Diarrhea    . Dyspnea on exertion    . Essential hypertension    . Exercise intolerance    . Fatty liver    . Fluid overload 08/21/21    Recent hospitalization for fluid overload, HD initiated 10/18/20   . Headache(784.0)    . Hemodialysis patient (CMS [place])     HD Monday-Wednesday-Friday   . 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 History of nephrectomy 03/24/2020    L nephrectomy   . Hx of echocardiogram 10/18/2020    Technically difficult d/t pt. body habitus; Left vent. EF est. 55-60%; mildy calcified aortic vlave leaflets-especially right coroanry cusp; Small mobile density on aortic valve-no aortic valve stenosis or regurg; Concentric remodeling; mild MR; Trace AR; Trileaflet aortic vlave; Nl IVC size wiht <50% insp. collapse;      Marland Kitchen Hyperlipidemia    . Hyperparathyroidism (CMS [place])    . Hyperthyroidism     Old records states hyperthyroidism, patient denies, states has hyperparathyroidism   . Irritable bowel syndrome    .  Macular edema    . Morbid obesity (CMS [place])    . Obesity    . Panic attack    . Peripheral edema     ankles/feet   . Peripheral neuropathy     bilateral feet   . Pneumonia 07/2019; 09/2020    COVID Pneumonia 07/2019; fluid overload/pneumonia with recent hosp. 09/2020   . Shortness of breath     SOB after climbing 8 steps    . Type 2 diabetes mellitus (CMS HCC)    . Wears glasses     reading   . White coat hypertension          Adherence/Compliance (scale of 1-10, 10 being best)  1. Diet (heart healthy and diabetic): 8 struggle with everything   2. Medications: 10  3. Medical appointments: 10  4. Dialysis treatments: 10    Who can speak to your compliance?: dialysis MSW   Have you ever signed out of a hospital or physical rehab setting AMA?: No  Have you ever been "fired" by a primary care physician or specialist in the past?:  No    Coping and Education  How do you cope with the stress of your illness?: cry, take a zanax as needed, eat   Do you find support in religion?: Yes. Church: [employer]  Pastor: Blane Ohara  Do you have community support?: Yes - great support with church family and coworkers   Do you have family support?: Yes - great family  Do you belong to any clubs?: No  Hobbies Pre-Illness: none  Hobbies Current: I love sloths and watching videos, coloring   What changes have you made due to your illness?: Don't get out as much       Patient Psychiatric/Behavioral History  Is there mental illness diagnosis/if so who diagnosed?: Yes - anxiety (10 yrs ago ) and depression (52yr ago)  PCP diagnosed and just meds  haven't had a panic attack in 2 years   Does patient agree with diagnosis?: yes  Has patient had suicidal thoughts?: No  Has patient attempted suicide?: No  Has patient had homicidal thoughts?: No  Has patient attempted homicide?: No    Patient Mental Health Treatment History  Type of treatment: N/A    Caregiver Coping/Mental Health  How do caregivers cope with stress?: DQuillian Quince It is what it is.  Play with dog   KMarcene BrawnI cry   Has caregiver had any mental health treatment or take medications?: No     Expectations and Understanding  Tell me why your doctor referred you for transplant?: I had cancer twice and my kidney is only functioning at 9%   Tell me why your organ is failing?: I need a kidney   Do you know anyone who has had a transplant?: none  What are your/family's expectations for what life will look like after transplant?: no strict diet and get rid of bad stuff in my body and have more energy   How do you feel about needing medical care (including taking medications) for the rest of your life?: i can do it   Why are you seeking transplant?: I want to live to see grandkids     Education  How do you like to learn?: both   Did you like how they reviewed discharge instructions with you on your last  hospitalization?: Yes  During past hospitalizations, did you feel confident you understood discharge instructions when you left the hospital?: Yes  Substance Use/Treament History  Nicotine Use (Present or History of): No  Anyone in the home currently a smoker?: No     Alcohol Use (Present or History of): No  Does anyone in the home drink?: No     Drug Use (Present or History of): No  Does anyone in the home use drugs?: No     Prescription Medication Misuse (Present or History of): No  Does anyone in the home misuse prescription meds/over the counter meds?: No     Negative consequence due to substance abuse/use (select all that apply): None    Legal/Abuse History  History of arrest/incarceration for yourself or someone in the family: No  Are you or someone in your home on probation/house arrest?: No  Outstanding warrants/charges pending?: No  Domestic violence incidents: No  Restraining orders on patient or on others: No     Impressions (choose all that apply):  Attitude: Cooperative  Affect: Appropriate  Mood: Elevated/Cheerful, Optimistic and Talkative    Patient Strength (choose all that apply): Adequate education, Capable of independent living (before and after transplant), Desire to return to work, Family/relationship stability, Has adequate insurance coverage, Religious affiliation/spirituality and Support of family/friends/community    Identified Challenges (choose all that apply): Financial difficulties    Recommendations for Further Services Needed (choose all that apply): None: none    Patient Given (choose all that apply): Other: caregiver and fundraising handout      The following psychological risks have been reviewed with the patient:  Depression: Yes  Post Traumatic Stress Disorder: Yes  Generalized Anxiety: Yes  Anxiety regarding dependence on others: Yes  Feelings of guilt: Yes    Recommendations for Transplant:   SIPAT Total Score: pending   SIPAT Score Interpretation  7 - 20: Good Candidate -  recommend to list-although monitoring of identified risk factors may be required    Please give a short narrative and then provide recommendations    Narrative: Met with Pt, husband Quillian Quince, and daughter [name] in clinic to complete psychosocial assessment. Pt and family appear engaged and vested. Pt has a reported excellent support system and appears motivated with reasonable and obtainable goals. Pt does report some financial stress with cost of healthcare bills. She states that they have spoken to her at dialysis about applying for medicare but she has not done it. She reports she applied for medicaid in Oct nd was denied prior to starting dialysis. Encouraged her to apply for the medicare and discussed the benefits it will offer and assistance as a back up to the [place]. She reports she is working 30 hours a week but still receives her STD, LTD and FMLA. She has already used her 5 weeks of paid STD this year but it should reset in June per her report.  Pt is open about her history of anxiety and depression but reports that it has been well managed with meds by the PCP and she has not had any anxiety attacks in several years. She is open to counseling if needed but not identified by this MSW at this time.      Recommendations for Transplant: Good Candidate    Date and Time of Interview:   Completed: In person  Persons present at interview: Pt, husband Quillian Quince and daughter Leonor Liv, [place]  01/11/2021, 08:05  Transplant Social Worker

## 2021-01-11 NOTE — Progress Notes (Signed)
[facility]  [address], [address]  [place], [place]  [address]  [phone]  Operated by [facility]      Patient Name:  Roselyne Stalnaker  MRN:  [id number]  DOB:  Aug 21, 1966    Date of Service:  01/11/2021     Kidney Transplant Surgical Evaluation Note      Reason for Consult: Alawna Graybeal is referred for consultation by Marijo Sanes.    History of Present Illness:   Seneca Gadbois is a delightful  55 y.o. White female who presents for kidney transplant evaluation.  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 no further treatment. Follow up with urology. She has  History of DM T2 07/2014 (neuropathy, HbA1c 8.1 as per patient, poorly controlled since starting dialysis), history of HTN, anxiety, depression, hip pain.   She showed easy crying, she feels down, denies fever, chest pain, abdominal pain, diarrhea, urinary emergency, urgency.   The patient is currently HD    Dialyzes: MWF  Previous Transplant: No   Cause of kidney disease:   Dialysis start date: 10/18/2020   GFR:  On HD  Previously evaluated for transplant:  no  Listed at other centers:  no  Post-transplant care provider:  Husband Daniel/daughter [name] present at interview  Living donor: potentially daughter  Urine: normal  AC: no    Functional Status  Very sedentary  Patient can not walk 1  blocks.  Any limitations: SOB, fatigue  Can not climb flights of stairs.    Patient uses walker/wheelchair/cane:  No   Occupation:  She works as Education officer, community part time  Marital status:  Married, they live in  [place] they live with their daughter    Past Medical History:   Diagnosis Date   . Anxiety    . Arthritis     hips   . Beta blocker prescribed for left ventricular systolic dysfunction     Labetalol    . Cancer (CMS [place]) 2019, 2021    kidney, Clear Cell Cancer left kidney-S/P partial nephrectomy 2019, then radical nephrectomy 2021   . Cataract     S/P cataract extraction bilaterally    . Cellulitis 10/24/14    left foot   . Cellulitis 10/21/2020    LLE extremity 09/2020-resolved per patient    . Chronic pain     sec. to arthritis-hips   . 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 Constipation    . COVID 07/2019   . Depression    . Diabetes mellitus (CMS [place])    . Diabetes mellitus, type 2 (CMS HCC)    . Diarrhea    . Dyspnea on exertion    . Essential hypertension    . Exercise intolerance    . Fatty liver    . Fluid overload 08/21/21    Recent hospitalization for fluid overload, HD initiated 10/18/20   . Headache(784.0)    . Hemodialysis patient (CMS [place])     HD Monday-Wednesday-Friday   . 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 History of nephrectomy 03/24/2020    L nephrectomy   . Hx of echocardiogram 10/18/2020    Technically difficult d/t pt. body habitus; Left vent. EF est. 55-60%; mildy calcified aortic vlave leaflets-especially right coroanry cusp; Small mobile density on aortic valve-no aortic valve stenosis or regurg; Concentric remodeling; mild MR; Trace AR; Trileaflet aortic vlave; Nl IVC size wiht <50% insp. collapse;      Marland Kitchen Hyperlipidemia    . Hyperparathyroidism (CMS [place])    . Hyperthyroidism     Old records states hyperthyroidism, patient denies, states has hyperparathyroidism   . Irritable bowel syndrome    . Macular edema    . Morbid obesity (CMS [place])    . Obesity    . Panic attack    . Peripheral edema     ankles/feet   . Peripheral neuropathy     bilateral feet   . Pneumonia 07/2019; 09/2020    COVID Pneumonia 07/2019; fluid overload/pneumonia  with recent hosp. 09/2020   . Shortness of breath     SOB after climbing 8 steps    . Type 2 diabetes mellitus (CMS HCC)    . Wears glasses     reading   . White coat hypertension            Past Surgical History:   Procedure Laterality Date   . AV FISTULA PLACEMENT Left 08/11/2020    LUE brachio cephalic fistula creation   . ECTOPIC PREGNANCY SURGERY  05/24/1998   . HX CATARACT REMOVAL Bilateral 2016   . HX CHOLECYSTECTOMY  09/10/96   . HX HAND SURGERY Right 2017    Trigger finger release 3rd finger   . HX OTHER Right 10/18/2020    tunneled dialysis catheter   . HX PELVIC LAPAROSCOPY  05/24/98    Ectopic pregnancy   . HX SHOULDER SURGERY Left 2017    Procedure for frozen shoulder   . HX TUBAL LIGATION  09/10/2012    Filshie Clips   . HYMENECTOMY  1996   . KIDNEY SURGERY  10/24/17; 03/24/20   . LAPAROSCOPIC NEPHRECTOMY, HAND ASSISTED Left 03/24/2020    Radical   . LAPAROSCOPIC PARTIAL NEPHRECTOMY Left 10/24/2017    hand-assisted   . RENAL BIOPSY, PERCUTANEOUS  03/08/2020    clear cell renal cell carcinoma           Allergies   Allergen Reactions   . Vicodin [Hydrocodone-Acetaminophen] Itching       Social History     Tobacco Use   . Smoking status: Never Smoker   . Smokeless tobacco: Never Used   Substance Use Topics   . Alcohol use: No       Family Medical History:     Problem Relation (Age of Onset)    Breast Cancer Paternal Aunt    Cancer Maternal Grandmother    Diabetes Father, Paternal Aunt    High Cholesterol Maternal Uncle, Mother    Hypertension (High Blood Pressure) Maternal Uncle, Father              Current Outpatient Medications:   .  albuterol sulfate (PROVENTIL OR VENTOLIN OR PROAIR) 90 mcg/actuation Inhalation HFA Aerosol Inhaler, Take 2 Puffs by inhalation Every 4 hours as needed (Patient not taking: Reported on 01/11/2021), Disp: 2 Inhaler, Rfl: 0  .  ALPRAZolam (XANAX) 0.25 mg Oral Tablet, Take 0.25 mg by mouth Once per day as needed for Anxiety, Disp: , Rfl:   .  atorvastatin (  LIPITOR) 20 mg Oral  Tablet, Take 20 mg by mouth Every evening, Disp: , Rfl:   .  cyclobenzaprine (FLEXERIL) 5 mg Oral Tablet, Take 5 mg by mouth Three times a day as needed for Muscle spasms, Disp: , Rfl:   .  escitalopram oxalate (LEXAPRO) 10 mg Oral Tablet, Take 10 mg by mouth Every night, Disp: , Rfl:   .  Fish Oil-Omega-3 Fatty Acids 360-1,200 mg Oral Capsule, Take 2 Caps by mouth Twice daily, Disp: , Rfl:   .  fluticasone (FLONASE) 50 mcg/actuation Nasal Spray, Suspension, Administer 1 Spray into each nostril Once per day as needed (Allergy symptoms (nasal congestion/ runny nose)), Disp: , Rfl:   .  JANUVIA 50 mg Oral Tablet, Take 50 mg by mouth Every morning, Disp: , Rfl:   .  labetaloL (NORMODYNE) 100 mg Oral Tablet, Take 0.5 Tablets (50 mg total) by mouth Twice daily for 14 days Indications: Hold for SBP<120 or HR<55, Disp: 14 Tablet, Rfl: 0  .  loperamide (IMODIUM) 2 mg Oral Capsule, Take 2 mg by mouth Every 4 hours as needed, Disp: , Rfl:   .  loratadine (CLARITIN) 10 mg Oral Tablet, Take 10 mg by mouth Once per day as needed (Allergies), Disp: , Rfl:   .  losartan (COZAAR) 25 mg Oral Tablet, Take 1 Tablet (25 mg total) by mouth Once a day for 30 days, Disp: 30 Tablet, Rfl: 0  .  traMADoL (ULTRAM) 50 mg Oral Tablet, Take 1 Tablet (50 mg total) by mouth Every 6 hours as needed for Pain, Disp: 30 Tablet, Rfl: 0  .  vitamin E 100 unit Oral Capsule, Take 100 Units by mouth Twice daily, Disp: , Rfl:       CBC  Diff   Lab Results   Component Value Date/Time    WBC 5.6 10/19/2020 05:41 AM    HGB 8.9 (L) 10/19/2020 05:41 AM    HCT 28.8 (L) 10/19/2020 05:41 AM    PLTCNT 201 10/19/2020 05:41 AM    SEDRATE 46 (H) 10/24/2014 10:13 AM    ESR 55 (H) 08/20/2019 02:43 PM    RBC 3.15 (L) 10/19/2020 05:41 AM    MCV 91.4 10/19/2020 05:41 AM    MCHC 30.9 (L) 10/19/2020 05:41 AM    MCH 28.3 10/19/2020 05:41 AM    RDW 14.2 03/31/2020 10:41 AM    MPV 11.4 10/19/2020 05:41 AM    Lab Results   Component Value Date/Time    PMNS 66 10/19/2020 05:41 AM     LYMPHOCYTES 13 (L) 03/31/2020 10:41 AM    EOSINOPHIL 1 03/31/2020 10:41 AM    MONOCYTES 10 10/19/2020 05:41 AM    BASOPHILS 0 10/19/2020 05:41 AM    BASOPHILS <0.10 10/19/2020 05:41 AM    PMNABS 3.72 10/19/2020 05:41 AM    LYMPHSABS 1.27 10/19/2020 05:41 AM    EOSABS <0.10 10/19/2020 05:41 AM    MONOSABS 0.55 10/19/2020 05:41 AM          Renal Function    Lab Results   Component Value Date/Time    SODIUM 139 10/27/2020 11:29 AM    POTASSIUM 3.6 10/27/2020 11:29 AM    CHLORIDE 98 10/27/2020 11:29 AM    CO2 31 (H) 10/27/2020 11:29 AM    ANIONGAP 10 10/27/2020 11:29 AM    Lab Results   Component Value Date/Time    CREATININE 2.47 (H) 10/27/2020 11:29 AM    GLUCOSE 250  (A) 10/16/2020 09:13 PM    CALCIUM  9.1 10/27/2020 11:29 AM    PHOSPHORUS 5.1 (H) 10/19/2020 05:41 AM    ALBUMIN 2.6 (L) 10/19/2020 05:41 AM        Review of Systems:     General: Negative for fevers, chills, edema, back/flank pain, fatigue  Neuro/Psych: Depression, Anxiety (Patient has a history of anxiety, well controlled with medication.)  ENT: Issues with vision (patient stated she has blurry vision at times.)  Card/Resp: Exertional dyspnea (Patient stated she gets short of breath when walking too much or too far.)  Do you know how to check your blood pressure: Yes  Do you have a blood pressure cuff at home: Yes  GI/GU: Diarrhea (Patient stated occasional diarrhea and constipation. Patient still makes a little urine.)  Do you have a scale at home: Yes  Integumentary: Negative for rashes, bumps/lumps           What activities are you doing at home: Completes ADLs. Works part time. Able to cook and clean if needed. Does light housework.  Initials: BB    Objective:  Vitals:    01/11/21 1108   BP: 128/72   Pulse: 90   Resp: 16   Temp: 36.8 C (98.2 F)   TempSrc: Oral   SpO2: 98%   Weight: 124 kg (273 lb 9.6 oz)   Height: 1.692 m (5' 6.61")   BMI: 43.44           Physical Examination:  Physical Exam  Vitals and nursing note reviewed. Exam conducted  with a chaperone present.   Constitutional:       Appearance: She is obese.   HENT:      Head: Normocephalic and atraumatic.      Right Ear: Tympanic membrane normal.      Left Ear: Tympanic membrane normal.      Nose: Nose normal.      Mouth/Throat:      Mouth: Mucous membranes are moist.      Pharynx: Oropharynx is clear.   Eyes:      Conjunctiva/sclera: Conjunctivae normal.      Pupils: Pupils are equal, round, and reactive to light.   Cardiovascular:      Rate and Rhythm: Normal rate and regular rhythm.      Pulses:           Femoral pulses are 1+ on the right side and 1+ on the left side.     Heart sounds: Normal heart sounds.      Comments: Weak femoral pulses, difficult to palpate due to body habitus  Pulmonary:      Effort: Pulmonary effort is normal.      Breath sounds: Normal breath sounds.   Abdominal:      General: Bowel sounds are normal.      Palpations: Abdomen is soft.      Comments: Very large abdomen, abundant pannus   Musculoskeletal:         General: Normal range of motion.      Cervical back: Normal range of motion and neck supple.   Skin:     General: Skin is warm.      Capillary Refill: Capillary refill takes less than 2 seconds.   Neurological:      General: No focal deficit present.      Mental Status: She is alert. Mental status is at baseline.   Psychiatric:         Mood and Affect: Mood normal.         Behavior:  Behavior normal.         Thought Content: Thought content normal.         Judgment: Judgment normal.         Diagnostic Testing:     EKG 01/23/2020  Normal sinus rhythm    Mammo     TTE 10/18/2020  EF: 55-60%  Mild MR/trace AR  LVSF normal  No SWMA  RV not well characterized/no ePASP/no RVSP     Kidney 10/25/2017    Final Pathologic Diagnosis      A. KIDNEY, FAT OVERLYING TUMOR, BIOPSY:   -   No evidence of malignancy.   -   Benign adipose tissue.      B. KIDNEY, LEFT TUMOR, PARTIAL NEPHRECTOMY:   -   Clear cell renal cell carcinoma, grade 2 (see comment).      Surgical  Pathology Cancer Case Summary      Procedure: Partial nephrectomy   Specimen Laterality: Left   Tumor Site:    Middle (per MRI)   Tumor Size: 3.1 cm in greatest dimension   Tumor Focality: Unifocal   Histologic Type: Clear cell renal cell carcinoma   Sarcomatoid Features: Not identified   Rhabdoid Features: Not identified   Histologic Grade: G2   Tumor Necrosis: Not identified   Tumor Extension: Tumor limited to kidney (see comment)   Margins: Uninvolved by invasive carcinoma   Regional Lymph Nodes: No lymph nodes submitted or found   Pathologic Stage Classification (pTNM): pT1a, Nx (see comment).   Pathologic Findings in Nonneoplastic Kidney: Insufficient tissue     KIDNEY, LEFT, BIOPSY:  03/08/2020  - Clear cell renal cell carcinoma, grade 2.  Electronically signed by Les Pou, MD on 03/09/2020 at 0932    Final Diagnosis  03/25/2020  LEFT KIDNEY, RADICAL NEPHRECTOMY:  - Recurrent clear cell renal cell carcinoma (see comment and protocol).  Electronically signed by Camillia Herter, MD on 03/25/2020 at 1441  Histologic Grade G4: Extreme nuclear pleomorphism and / or multinuclear  giant cells and / or rhabdoid and / or  sarcomatoid differentiation  Tumor Size Greatest Dimension (Centimeters): 3.5 cm  Tumor Focality Unifocal  Tumor Extension Tumor extension into perinephric tissue (beyond  renal capsule)  Sarcomatoid Features Not identified  Rhabdoid Features Present  Tumor Necrosis Not identified  Lymphovascular Invasion Not identified  MARGINS  Margins Uninvolved by invasive carcinoma  PATHOLOGIC STAGE CLASSIFICATION (pTNM, AJCC 8th Edition)  TNM Descriptors r (recurrent)  Primary Tumor (pT) pT3a  Regional Lymph Nodes (pN) pNX      CTAP 03/31/2020  IMPRESSION:  1. Small postoperative hematomas in the left renal surgical bed.  2. Small amount of blood in the left abdominal wall subcutaneous tissues  and abdominal wall.  The liver, spleen, pancreas, adrenal  glands and right kidney are  unremarkable   status post left nephrectomy  I    MRI Abd 01/2020  IMPRESSION:  1. Status post partial nephrectomy on the left for clear cell carcinoma.  There continues to be a 2.8 x 2.8 cm mass in the left perinephric space  with intense homogeneous enhancement. It is unchanged in size compared to  prior study of 08/20/2019. On the remote study of 10/26/2017, there was  perinephric hematoma at this site. Therefore this likely represents  residual scarring from the perinephric hematoma that has a masslike  appearance. Moreover it stability from the most recent prior study of  08/20/2019 is reassuring.  2. Continued MRI follow-up at 6 months.  CTAP 10/08/2017    IMPRESSION:  1. No inflammatory process or mass lesions.   Normal appendix.  2. No stones or hydronephrosis.  3. There is a 3 cm indeterminate lesion in the left kidney that measures 28  Hounsfield units and could represent a complex cyst versus solid mass. This  requires additional imaging workup with MRI. If patient has acute renal  failure and cannot receive IV contrast, a noncontrast MRI should be  Considered.      Kidney U/S 10/08/2017  FINDINGS  RIGHT KIDNEY:   - SIZE: 11 cm. No hydronephrosis.  - MORPHOLOGY:  Normal echogenicity. Normal thickness of cortex. Cortico  medullary differentiation well preserved.  - ADDITIONAL COMMENT: No stones or solid masses.    LEFT KIDNEY: Limited visualization of left kidney due to body habitus.  - SIZE: 11 cm. No hydronephrosis.  - MORPHOLOGY:  Normal echogenicity. Normal thickness of cortex. Cortico  medullary differentiation well preserved.      Assessment/Plan:     Zoey  is a delightful  55 y.o. obese White female who presents for kidney transplant evaluation.  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 no further treatment. Due to her condition she will need to wait 2 years before initiation of work up for transplant can be done, in the interval she can optimize her weight since her BMI is close to 44 with most of her weight in her waist, her initial goal will be to lose 20-30 lbs. She also can improve her functional status since she is very sedentary  also her HbA1c, can be stabilized. she has a potential living donor, she is on dialysis.          Work-up Task  Cardiology Required Comment   EKG n    TTE n    DSE n    Cardiac Cath n    Clearances     Dermatology Clearance n  .    Psychiatry Clearance n    Cancer Screening     2V Chest X-Ray n    Colonoscopy n    Mammogram n    Pap Smear n    Ultrasound of native Kidneys or CT of Abdomen to assess for complex renal lesions n    Imaging Study for iliac vessels assessment n    Other     Weight Loss Mandatory weight loss is needed if BMI is over 40kg/m2. Preferable to have a BMI less than 35kg/m2.   Smoking Cessation N/A    Additional Requests As above    Immunizations As recommended         UNOS Primary Diagnosis: solitary kidney/diabetes mellitus type 1    Patient Active Problem List    Diagnosis   . History of kidney disease   . Cancer (CMS [place])   . Fluid overload   . CKD (chronic kidney disease), stage V (CMS [facility])   . Persistent vomiting   . Morbid obesity   . 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 Hyperkalemia   . AKI (acute kidney injury) (CMS [facility])   .  Slow transit constipation   . C. difficile diarrhea   . Essential hypertension   . Obesity   . Cellulitis of foot, left   . Type 2 diabetes mellitus (CMS HCC)   . Diabetic foot infection (CMS [place])       Summary:  Ramonita Koenig is an 55 y.o. White female who presents for  evaluation of kidney transplantation. Anay Walter appears to be a potential candidate for a kidney transplant. The patient reports that they do have potential living kidney donors.    I spent 30 minutes in consultation discussing the following:   We discussed transplantation outcomes are reported in the Scientific Registry of Transplant Recipients [facility]). Additionally, I discussed with, in detail, the surgical risks and benefits of kidney transplantation including, but not limited to: bleeding, infection, need for re-operation, delayed graft function, primary graft non-function, kidney thrombosis, kidney leak, risk of allograft rejection, cardiovascular complication, need for chronic immunosuppressive therapy as well as the risk of intra-operative and postoperative death. Transplantation offers the potential of a return to a more normal lifestyle, but there are increased risks perioperatively.  Immunosuppressive drugs are required to prevent rejection and must be taken as long as the kidney transplant functions. If drug doses are missed, the kidney may reject irreversibly. Other medications may be necessary, including medications for blood pressure, fluid, and diabetes control. Symptoms and problems due to kidney failure will improve after transplantation, but unrelated problems may not improve. We discussed some of the side effects of chronic immunosuppressive therapy, including but not limited to: hypertension, insulin resistance, bone disease, mood disorders, hyperlipidemia, renal allograft dysfunction and/or failure as well as increased risk of certain infections and malignancies.   We discussed the finite life span of transplanted organs.  There is always the possibility of disease transmission - infections or cancer - from a donor (living or deceased). Deceased donor kidneys come from the general public. As they are unconscious, their history must come from their family and friends. They may have a problem  of which their family is not unaware.  If there are donor risks that exceed the usual, these are labeled PHS Higher Risk, you will be specifically informed of this before the transplant and have the option of saying no for that specific donor. PHS higher risk donors may have a history that includes use or history of using IV drugs, jail time, multiple sexual partners, trading sex for money or drug, and other social items. You always have the option to decline a kidney and this decision will not affect your status on the list.   Benefits of living donors for recipient and graft survival. We discussed the process of living donation and how to proceed if the patient is interested. This discussion included direct and paired donation as well as anonymous and compatible shares.   Finally, we discussed the allocation process of donor kidney organs. There are many factors that are considered when a kidney becomes available. These include blood type, length of time on the list, and other factors. The first factor is donor quality, measured by KDPI (Kidney Donor Profile Index), a scale from 1-100%, where the numerical value represents the percentage of donors better than this donor. Variables for the KDPI include donor age, a history of donor diabetes, a history of donor high blood pressure, and others. As an aggregate, donors with a KDPI above 85% have a slightly worse outcome than donors whose KDPI is less than 85%. The individual decision to use a  higher KDPI donor is based on numerous factors. We discussed high KDPI donors and the patient decided to accept.  All of the patient's questions were answered to satisfaction and the patient wishes to proceed with the remainder of the transplant evaluation. Once evaluation is completed, Javayah Magaw will be presented to the multidisciplinary transplant selection committee meeting.    Derald Macleod, MD  01/11/2021, 09:03    Primary Transplant Coordinator:  Baker Janus

## 2021-01-11 NOTE — Progress Notes (Signed)
[facility]  [address], [address]  [place], [place]  [address]  [phone]  Operated by [facility]      Patient Name:  [name]  MRN:  [id number]  DOB:  [birth year]    Date of Service:  01/11/2021     Transplant Nephrology Evaluation Note      Reason for Consultation: Kidney Transplant Evaluation  Referred for consultation by Marijo Sanes    History of Present Illness:   SUBJECTIVE:  A 55 year old female with a past medical history of end-stage renal disease on hemodialysis, renal cell carcinoma, obesity, diabetes, who comes to the clinic for a kidney transplant evaluation.  Her daughter is interested to donate, but she has to lose some weight.  Her BMI is 50.  She does have renal cell carcinoma requiring a total nephrectomy in June 2021.  No recent infections.  She was hospitalized in January 2022, for fluid overload and was started on dialysis at that time.  She never had any blood transfusions or blood clots.    RENAL HISTORY:  End-stage renal disease on hemodialysis since January 2022.  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  Access is left upper extremity AV fistula.  The TCC was removed recently.  She still makes lots of urine.  No complications with dialysis.    PHYSICAL TOLERANCE:  Not great.  She cannot walk more than a block and she has to stop after a few steps when she is climbing stairs.  Her activity is slowly improving since she started dialysis.    PAST MEDICAL HISTORY:    1. Diabetes mellitus for the last 7 years.  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  She does have neuropathy.  She never had any foot ulcers, but she has had cellulitis in her legs several years ago.  Her last hemoglobin A1c is 8.1.  2. Hypertension.  3. Left renal cell carcinoma diagnosed in 2019.  She had a partial nephrectomy in January 2019.  The margins were negative, but there was a capsular rupture.  There was a question of tumor  spill age and there was also maybe a question of a residual mass, which was thought to be a postsurgical hematoma.  This was followed with CT scan and surveillance in May 2021.  The tumor grew.  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  She was seen by heme-onc after that.  No further chemotherapy was needed, but since it recurred after a couple of years and there was concern for tumor spillage from the capsular rupture, she was deemed to be at high risk for recurrence.  A CT chest was recommended, but the patient has not done it yet.  4. Clostridium difficile in 2016.  5. Hypertension.  6. Obesity.  7. Anxiety and depression.  Follows with her PCP.  8. Osteoarthritis.    PAST SURGICAL HISTORY:    1. Ectopic pregnancy, laparoscopy.  2. Cataract surgery.  3. Cholecystectomy.  4. Tubal ligation.  5. Partial nephrectomy in January 2019.  6. Total nephrectomy, left side, in June 2021.    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  She is still working.  No smoking.  No alcohol.  No IV drug abuse.    FAMILY HISTORY:  No family history of kidney disease.    Allergies   Allergen Reactions   .  Vicodin [Hydrocodone-Acetaminophen] Itching       Current Outpatient Medications   Medication Sig   . albuterol sulfate (PROVENTIL OR VENTOLIN OR PROAIR) 90 mcg/actuation Inhalation HFA Aerosol Inhaler Take 2 Puffs by inhalation Every 4 hours as needed   . ALPRAZolam (XANAX) 0.25 mg Oral Tablet Take 0.25 mg by mouth Once per day as needed   . atorvastatin (LIPITOR) 20 mg Oral Tablet Take 20 mg by mouth Every evening   . calcitrioL (ROCALTROL) 0.25 mcg Oral Capsule Take 0.25 mcg by mouth Once a day (Patient not taking: Reported on 11/18/2020)   . escitalopram oxalate (LEXAPRO) 10 mg Oral Tablet Take 10 mg by mouth Every night   . Fish Oil-Omega-3 Fatty Acids 360-1,200 mg Oral Capsule Take 2 Caps by mouth Twice daily   . fluticasone (FLONASE) 50  mcg/actuation Nasal Spray, Suspension Administer 1 Spray into each nostril Once per day as needed   . JANUVIA 50 mg Oral Tablet Once a day   . labetaloL (NORMODYNE) 100 mg Oral Tablet Take 0.5 Tablets (50 mg total) by mouth Twice daily for 14 days Indications: Hold for SBP<120 or HR<55   . loperamide (IMODIUM) 2 mg Oral Capsule Take 2 mg by mouth Every 4 hours as needed   . loratadine (CLARITIN) 10 mg Oral Tablet Take 10 mg by mouth   . losartan (COZAAR) 25 mg Oral Tablet Take 1 Tablet (25 mg total) by mouth Once a day for 30 days   . traMADoL (ULTRAM) 50 mg Oral Tablet Take 1 Tablet (50 mg total) by mouth Every 6 hours as needed for Pain       Review of Systems:     Constitutional- denies fever. +loss of weight (12 pounds since starting hd. denies weight gain  ENT- denies vision loss. denies hearing loss  Respiratory- denies cough. denies shortness of breath  Cardiovascular- denies chest pain. + exertional shortness of breath. denies lower extremity swelling  Gastrointestinal- denies nausea. denies vomiting. denies diarrhea  Genitourinary- denies hematuria. denies foamy urine. denies nocturia. denies weak stream  Musculoskeletal- denies joint pain. denies joint swelling.   Hematologic- denies easy bruising. denies bleeding  Psychologic- +anxiety. denies depression   Skin/ Allergic- denies rash. denies itching  Neurological- denies headache. denies focal weakness    Objective:  There were no vitals filed for this visit.        Physical Examination:  General- Alert and oriented*3, no distress  Eyes- no icterus, extraocular movements intact  HEENT- Normocephalic atraumatic  Neck- Supple, no lymphadenopathy, no JVD  Heart- RRR, S1 S2 heard, no murmur  Respiratory- clear to auscultation bilaterally. no crackles or wheeze  Gastrointestinal- soft, non tender, no organomegaly. laproscopy scar in abdomen   Extremities- no lower extremity edema, no rash   Neurological- no tremors, no focal deficits  LUE AVF  thrill+    Investigations:  CBC  Diff   Lab Results   Component Value Date/Time    WBC 5.6 10/19/2020 05:41 AM    HGB 8.9 (L) 10/19/2020 05:41 AM    HCT 28.8 (L) 10/19/2020 05:41 AM    PLTCNT 201 10/19/2020 05:41 AM    SEDRATE 46 (H) 10/24/2014 10:13 AM    ESR 55 (H) 08/20/2019 02:43 PM    RBC 3.15 (L) 10/19/2020 05:41 AM    MCV 91.4 10/19/2020 05:41 AM    MCHC 30.9 (L) 10/19/2020 05:41 AM    MCH 28.3 10/19/2020 05:41 AM    RDW 14.2 03/31/2020 10:41 AM  MPV 11.4 10/19/2020 05:41 AM    Lab Results   Component Value Date/Time    PMNS 66 10/19/2020 05:41 AM    LYMPHOCYTES 13 (L) 03/31/2020 10:41 AM    EOSINOPHIL 1 03/31/2020 10:41 AM    MONOCYTES 10 10/19/2020 05:41 AM    BASOPHILS 0 10/19/2020 05:41 AM    BASOPHILS <0.10 10/19/2020 05:41 AM    PMNABS 3.72 10/19/2020 05:41 AM    LYMPHSABS 1.27 10/19/2020 05:41 AM    EOSABS <0.10 10/19/2020 05:41 AM    MONOSABS 0.55 10/19/2020 05:41 AM          Renal Function    Lab Results   Component Value Date/Time    SODIUM 139 10/27/2020 11:29 AM    POTASSIUM 3.6 10/27/2020 11:29 AM    CHLORIDE 98 10/27/2020 11:29 AM    CO2 31 (H) 10/27/2020 11:29 AM    ANIONGAP 10 10/27/2020 11:29 AM    Lab Results   Component Value Date/Time    CREATININE 2.47 (H) 10/27/2020 11:29 AM    GLUCOSE 250  (A) 10/16/2020 09:13 PM    CALCIUM 9.1 10/27/2020 11:29 AM    PHOSPHORUS 5.1 (H) 10/19/2020 05:41 AM    ALBUMIN 2.6 (L) 10/19/2020 05:41 AM        Assessment/Plan:   A 55 year old female with a past medical history of end-stage renal disease on hemodialysis, diabetes, renal cell carcinoma, who comes to the clinic for kidney transplant evaluation.  Exercise tolerance is not great, slowly getting better after starting dialysis.  Encouraged her to be more physically active.     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  She needs to be followed up.     She has a history of renal cell carcinoma requiring partial  nephrectomy.  It recurred 2 years later and she required a total nephrectomy in June 2021.  She was seen by hem/onc and this was thought to be at high risk for recurrence.  A CT chest was ordered.  The patient has not followed up on that, but they have tried to go back to hem/onc and follow up with them.  She would have to wait an average of 2-5 years with her recurrent cancer. Will discuss further in the selection meeting.     Her daughter is interested to donate, but she has to lose some weight.  The daughter's BMI is 53.      The patient is obese with a BMI of 42.  Strongly recommended weight loss.  Will defer the weight loss goal to the surgeons.     Thank you much for this consultation.    UNOS Primary Diagnosis: Diabetes Mellitus-Type II    I have counseled on the various modalities of transplantation including deceased donor and living donor kidney transplantation. Transplantation improves the patient survival compared to staying on dialysis. We discussed transplantation outcomes as reported in the Scientific Registry of Transplant Recipients., I have counseled on risks and benefits of transplantation., I have counseled on the need to take the immunosuppressants as long as the kidney transplant functions. I have explained the risks of immunosuppression including increased risk of infection and malignancy. and I have counseled on the benefits of living donor transplantation. Living donor transplant offers better recipient and graft survival. We discussed the process of living donation and how to proceed if the patient is interested. This discussion included direct and paired donation as  well as anonymous and compatible shares.  All of the patient's questions were answered to satisfaction and the patient wishes to proceed with the remainder of the transplant evaluation. Once evaluation is completed, Helene Bernstein will be presented to the multidisciplinary transplant selection committee meeting.    Despina Hick, MD  01/11/2021, 08:57    Primary Transplant Coordinator:  Baker Janus

## 2021-01-12 NOTE — Progress Notes (Signed)
[facility]  [address]  [place], [place]  [address]  [phone]  Operated by [facility]      Patient Name:  [name]  MRN:  [id number]  DOB:  November 02, 1965    Date of Service:  01/11/2021    Dietitian Pre-Transplant Initial Assessment Note      Subjective:  [name] is a 55 y.o. female.  This RD spoke with [name] today at via telephone.  Patient assessed for kidney transplant on 01/11/21. RD unavailable at that time. Patient completed self assessment which as been scanned into EMR.  The following information and all nutrition questions were answered at time of visit. Pt verbalized understanding of post-transplant diet recommendations.      Past Medical History:  DM, HTN, ERSD and HD/PD    Current Medications/Vitamins/Minerals/Herbal Supplements:      Current Outpatient Medications:   .  albuterol sulfate (PROVENTIL OR VENTOLIN OR PROAIR) 90 mcg/actuation Inhalation HFA Aerosol Inhaler, Take 2 Puffs by inhalation Every 4 hours as needed (Patient not taking: Reported on 01/11/2021), Disp: 2 Inhaler, Rfl: 0  .  ALPRAZolam (XANAX) 0.25 mg Oral Tablet, Take 0.25 mg by mouth Once per day as needed for Anxiety, Disp: , Rfl:   .  atorvastatin (LIPITOR) 20 mg Oral Tablet, Take 20 mg by mouth Every evening, Disp: , Rfl:   .  cyclobenzaprine (FLEXERIL) 5 mg Oral Tablet, Take 5 mg by mouth Three times a day as needed for Muscle spasms, Disp: , Rfl:   .  escitalopram oxalate (LEXAPRO) 10 mg Oral Tablet, Take 10 mg by mouth Every night, Disp: , Rfl:   .  Fish Oil-Omega-3 Fatty Acids 360-1,200 mg Oral Capsule, Take 2 Caps by mouth Twice daily, Disp: , Rfl:   .  fluticasone (FLONASE) 50 mcg/actuation Nasal Spray, Suspension, Administer 1 Spray into each nostril Once per day as needed (Allergy symptoms (nasal congestion/ runny nose)), Disp: , Rfl:   .  JANUVIA 50 mg Oral Tablet, Take 50 mg by mouth Every morning, Disp: , Rfl:   .  labetaloL (NORMODYNE) 100 mg Oral  Tablet, Take 0.5 Tablets (50 mg total) by mouth Twice daily for 14 days Indications: Hold for SBP<120 or HR<55 (Patient taking differently: Take by mouth Take 0.5 (half) tablet twice daily on non-dialysis days (Sun/Tues/Thurs/Sat); Take once daily in the evening on dialysis days (Mon/Wed/Fri) Indications: Hold for SBP<120 or HR<55), Disp: 14 Tablet, Rfl: 0  .  loperamide (IMODIUM) 2 mg Oral Capsule, Take 4 mg by mouth Every 6 hours as needed (Diarrhea), Disp: , Rfl:   .  loratadine (CLARITIN) 10 mg Oral Tablet, Take 10 mg by mouth Once per day as needed (Allergies), Disp: , Rfl:   .  losartan (COZAAR) 25 mg Oral Tablet, Take 1 Tablet (25 mg total) by mouth Once a day for 30 days (Patient taking differently: Take 25 mg by mouth Take in the evening on non-dialysis days (Sun/Tues/Thurs/Sat); does not take on dialysis days), Disp: 30 Tablet, Rfl: 0  .  traMADoL (ULTRAM) 50 mg Oral Tablet, Take 1 Tablet (50 mg total) by mouth Every 6 hours as needed for Pain, Disp: 30 Tablet, Rfl: 0  .  vitamin E 100 unit Oral Capsule, Take 100 Units by mouth Twice daily, Disp: , Rfl:     Labs:  Lab Results   Component Value Date    SODIUM 139 10/27/2020    POTASSIUM 3.6 10/27/2020    CHLORIDE  98 10/27/2020    CO2 31 (H) 10/27/2020    ANIONGAP 10 10/27/2020    BUN 9 10/27/2020    CREATININE 2.47 (H) 10/27/2020    BUNCRRATIO 4 (L) 10/27/2020    GFR 22 (L) 10/27/2020    GLUCOSENF 144 (H) 10/27/2020    CALCIUM 9.1 10/27/2020     Lab Results   Component Value Date    MAGNESIUM 1.7 (L) 10/19/2020    PHOSPHORUS 5.1 (H) 10/19/2020    IRON 44 11/21/2017    ALBUMIN 2.6 (L) 10/19/2020     No results found for: TRIG, CHOLESTEROL, HDLCHOL, LDLCHOL, VLDLCAL, NONHDLCALC  Lab Results   Component Value Date    ALBUMIN 2.6 (L) 10/19/2020    ALKPHOS 74 10/17/2020    ALT 16 10/17/2020    AST 12 10/17/2020    TOTBILIRUBIN 0.3 10/17/2020    TOTALPROTEIN 5.5 (L) 10/17/2020       Nutritional Daily Living Factors  Home Diet::  Regular, Renal, Diabetic and Pt  notes she does the best she can but struggles to adhere to dietary recommendations.    Nutrition Supplements:  Nepro 3/week    Food Allergies/Intolerance:  Yes. dairy    Cultural/Religious Food Preferences:  No    Appetite:  Good     Intake History:    B:  Oatmeal or eggs, maybe bacon, hot tea with splenda  L:  School lunch, water  D:  Chicken, burger, potatoes or noodles, vegetables  S:  Sweet potato chips, apple or crackers with peanut butter  Bev: water, hot tea, occ Sprite    Taste Aversion:  Yes. seafood    Alcohol Consumption:  No    Gastrointestinal Issues:  Diarrhea, Constipation and notes these are rare, related to medications    Water:  Well    Who does the cooking:  Patient, Spouse and Child/Children    Who does the grocery shopping:  Spouse    How often do you eat out:  Once a week    Government Assistance Programs:  N/A    Physical Activity:  none    Dentition:  Missing Teeth    Anthropometrics:  Height used for calculations:  Height: 169.2 cm (5' 6.61")  Weight used for calculations:  Weight: 124 kg (273 lb 9.6 oz)  BMI (kg/m2):  Body mass index is 43.35 kg/m.  BMI assessment:  Morbidly obese  IBW:  60 kg  %IBW:  206 %  Usual body weight:  124 kg  Reported weight loss:  0 kg  Reported weight gain:  0 kg    Estimated Needs:  Energy calorie requirements:  1900-2280  Cal (25-30 Cal/kg AdjBW 76 kg) per day  Protein requirements:  72-90 g (1.2-1.5g/kg IBW 60 kg) per day  Fluid requirements: urine output +500-750 ml per day    Physical Assessment:  A physical assessment was unable to be performed on this patient.     Barriers:  Obesity    Nutrition Inventions  Education/Handouts:  None    Assessment/Nutrition Diagnosis:  Increased nutrient needs related to Pathophysiological  Increased biological demands for nutrients and protein as evidenced by pt with ESRD on HD    Recommendations/Plan:    - Follow a Renal Diet at home  - Monitor sodium intake, recommend < 2 gm daily  - Continue to get adequate protein,  optimize nutrition   - 3 meals daily or small frequent meals   - Daily physical activity  - Weigh self daily   -  Continue to work on weight loss  - Continue to follow with dialysis dietitian monthly       Candidate for a transplant:  [name] is a fair candidate for a Kidney transplant from a nutrition perspective and shows low-moderate compliance with a renal diet. Pt would be a good candidate with ongoing nutrition support and adherence to dietary recommendations.  Body mass index is 43.35 kg/m.       Kathlee Nations, RD 01/12/2021, 15:30

## 2021-01-17 ENCOUNTER — Encounter (HOSPITAL_COMMUNITY): Payer: Self-pay

## 2021-01-17 NOTE — Committee Review (Signed)
[facility]  [address]  [place], [place]  [address]  [phone]  Operated by [facility]      Patient Name:  [name]  MRN:  [id number]  DOB:  May 19, 1966    Kidney Transplant Committee Review Listing Note    Evaluation Date: 01/11/2021  Committee Review Date: 01/17/2021    Organ being evaluated for: Kidney    Transplant Phase: Evaluation  Transplant Status: Active    Transplant Coordinator: Dillard Cannon  Transplant Care Team:  Evaluation Surgeon: Derald Macleod, MD   [place] Nephrologist: Despina Hick, MD   Referring Physician: Marijo Sanes, MD   Primary Care Physician: Ocie Doyne, NP       Primary Diagnosis: Diabetes Mellitus - Type II    Committee Review Members Present  GENERAL Dafonzo, Silvana Newness, Office Staff; Gala Murdoch, Pharmacy Technician   INFECTIOUS DISEASE Ortencia Kick, MD   LICENSED CLINICAL SOCIAL WORKER O'Neil, [place], SOCIAL WORKER   NEPHROLOGY Despina Hick, MD   PHARMACY Selinda Eon, [place] April Holding, MD; Dillard Cannon, RN; Oswaldo Milian, RN; Macky Lower, RN; Derald Macleod, MD; Baker Janus, RN; Lenis Dickinson, RN; Arelia Sneddon, RN        Eligibility for Kidney Transplant:   Dialysis dependence    Relative Contraindications:  History of malignancy within the past 5 years    Absolute Contraindications:  None    Inclusion Criteria:  []  Age>17  []  ABOx2  []  Surgical Evaluation  []  Nephrology Evaluation  []  Dietitian Evaluation  []  Pharmacy Evaluation  []  Psychosocial Evaluation  []  Financial Evaluation  []  Labs  []  Serologies  []  EKG  []  Chest X-ray  []  Drug Screen  []  Abdominal Imaging  Applicable Cancer Screening Tests per Guidelines:  []  Mammogram  []  Pap Smear  []  PSA  []  Colonoscopy  Applicable Cardiac Testing per Protocol  []  Echo  []  Stress Test  []  Cath  []  Cardiology Consult  Other Medical Testing Required (If Indicated)  []  Carotid US  []   Pulmonary Function Test  []  Prior Cancer Risk Assessment  []  Other -     Patient Did NOT Meet all inclusion criteria    Committee Review Decision: Decline   Reason: Active Malignancy    Committee Team Discussion  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 Had total nephrectomy at that time.  Two year wait time for grade II renal cell carcinoma.    In order to be re-considered for evaluation will need for following:  1. Two years cancer free from last treatment (June 2023)  2. Work on weight loss goal of 25# as discussed during evaluation appointment.         Baker Janus, RN  01/17/2021, 14:46    I have reviewed the committee review note and attest to the committee decision and supporting documentation. The patient Did NOT Meet selection criteria as listed above.  April Holding, MD  01/24/2021, 13:07

## 2021-01-18 ENCOUNTER — Telehealth (HOSPITAL_COMMUNITY): Payer: Self-pay

## 2021-01-18 NOTE — Nursing Note (Signed)
Attempted to call [name] to discuss the outcome of Selection Committee, no answer. There was no option to leave a message.    Dillard Cannon, RN  01/18/2021, 14:05      [name] was discussed at Selection Committee and it was determined to close her evaluation due to Active Malignancy. She will need an Oncology follow-up and to return 2 years after her last diagnosis (June 2023 would be the earliest to return) per our Transplant protocols.    Called and discussed the decision with [name] and she had no further questions.    Her evaluation is closed at this time due to Active Malignancy.    Dillard Cannon, RN  01/20/2021, 14:40

## 2021-01-27 ENCOUNTER — Ambulatory Visit (INDEPENDENT_AMBULATORY_CARE_PROVIDER_SITE_OTHER): Payer: 59 | Admitting: Urology

## 2021-01-27 ENCOUNTER — Encounter (INDEPENDENT_AMBULATORY_CARE_PROVIDER_SITE_OTHER): Payer: Self-pay | Admitting: Urology

## 2021-01-27 ENCOUNTER — Other Ambulatory Visit: Payer: Self-pay

## 2021-01-27 VITALS — BP 172/96 | HR 100 | Temp 97.1°F | Ht 67.0 in | Wt 270.0 lb

## 2021-01-27 DIAGNOSIS — C642 Malignant neoplasm of left kidney, except renal pelvis: Secondary | ICD-10-CM

## 2021-01-27 DIAGNOSIS — N179 Acute kidney failure, unspecified: Secondary | ICD-10-CM

## 2021-01-27 DIAGNOSIS — N185 Chronic kidney disease, stage 5: Secondary | ICD-10-CM

## 2021-01-27 NOTE — Progress Notes (Signed)
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      Orders Placed This Encounter   . MRI ABDOMEN (LIVER,PANCREAS,MRCP) W/WO CON       No follow-ups on file.         Chief Complaint:   Chief Complaint   Patient presents with   . Kidney Cancer     1 year f/u       History of Present Illness:    55 y.o. female presents for follow up of left nephrectomy for recurrence of clear cell carcinoma. The patient underwent partial nephrectomy in January 2019. She reports she is now on dialysis for 3 days a week. She was in the hospital in January for 3 days due to fluid overload and dialysis was started at that time. She reports she will have to reapply for transplant in June. Her daughter is with her today and could be considered as a kidney donor.    The patient was following with Dr. Oletta Darter but prefers to do surveillance with me. She also follows with Dr. Judeth Horn. She reports a Chest XR in January and in February. Denies metal in her body. She reports she has lost >30 lbs since last visit. She has been having hot flashes and was prescribed iron. Parathyroid hormone was elevated and she was prescribed Hectorol.    Past Medical History:    Past Medical History:   Diagnosis Date   . Anxiety    . Arthritis     hips   . Beta blocker prescribed for left ventricular systolic dysfunction     Labetalol    . Cancer (CMS [place]) 2019, 2021    kidney, Clear Cell Cancer left kidney-S/P partial nephrectomy 2019, then radical nephrectomy 2021   . Cataract     S/P cataract extraction bilaterally    . Cellulitis 10/24/14    left foot   . Cellulitis 10/21/2020    LLE extremity 09/2020-resolved per patient    . Chronic pain     sec. to arthritis-hips   . 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 Constipation     . COVID 07/2019   . Depression    . Diabetes mellitus (CMS [place])    . Diabetes mellitus, type 2 (CMS [facility])    . Diarrhea    . Dyspnea on exertion    . Essential hypertension    . Exercise intolerance    . Fatty liver    . Fluid overload 08/21/21    Recent hospitalization for fluid overload, HD initiated 10/18/20   . Headache(784.0)    . Hemodialysis patient (CMS [facility])     HD Monday-Wednesday-Friday   . 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 History of nephrectomy 03/24/2020    L nephrectomy   . Hx of echocardiogram 10/18/2020    Technically difficult d/t pt. body habitus; Left vent. EF est. 55-60%; mildy calcified aortic vlave leaflets-especially right coroanry cusp; Small mobile density on aortic valve-no aortic valve stenosis or regurg; Concentric remodeling; mild MR; Trace AR; Trileaflet aortic vlave; Nl IVC size wiht <50% insp. collapse;      Marland Kitchen Hyperlipidemia    .  Hyperparathyroidism (CMS [place])    . Hyperthyroidism     Old records states hyperthyroidism, patient denies, states has hyperparathyroidism   . Irritable bowel syndrome    . Macular edema    . Morbid obesity (CMS [place])    . Obesity    . Panic attack    . Peripheral edema     ankles/feet   . Peripheral neuropathy     bilateral feet   . Pneumonia 07/2019; 09/2020    COVID Pneumonia 07/2019; fluid overload/pneumonia with recent hosp. 09/2020   . Shortness of breath     SOB after climbing 8 steps    . Type 2 diabetes mellitus (CMS [facility])    . Wears glasses     reading   . White coat hypertension            Past Surgical History:  Past Surgical History:   Procedure Laterality Date   . AV FISTULA PLACEMENT Left 08/11/2020    LUE brachio cephalic fistula creation   . ECTOPIC PREGNANCY SURGERY  05/24/1998   . HX CATARACT REMOVAL Bilateral 2016   . HX CHOLECYSTECTOMY  09/10/96   . HX HAND SURGERY Right 2017    Trigger finger release 3rd finger   . HX OTHER Right 10/18/2020    tunneled dialysis catheter   . HX PELVIC LAPAROSCOPY  05/24/98     Ectopic pregnancy   . HX SHOULDER SURGERY Left 2017    Procedure for frozen shoulder   . HX TUBAL LIGATION  09/10/2012    Filshie Clips   . HYMENECTOMY  1996   . KIDNEY SURGERY  10/24/17; 03/24/20   . LAPAROSCOPIC NEPHRECTOMY, HAND ASSISTED Left 03/24/2020    Radical   . LAPAROSCOPIC PARTIAL NEPHRECTOMY Left 10/24/2017    hand-assisted   . RENAL BIOPSY, PERCUTANEOUS  03/08/2020    clear cell renal cell carcinoma           Review of Systems:  Constitutional: No fever, chills, or weight loss  GU: As per HPI    Allergies:  Allergies   Allergen Reactions   . Vicodin [Hydrocodone-Acetaminophen] Itching       Home Medications:  Prior to Admission medications    Medication Sig Start Date End Date Taking? Authorizing Provider   albuterol sulfate (PROVENTIL OR VENTOLIN OR PROAIR) 90 mcg/actuation Inhalation HFA Aerosol Inhaler Take 2 Puffs by inhalation Every 4 hours as needed 08/22/19  Yes Justice Rocher, MD   ALPRAZolam Duanne Moron) 0.25 mg Oral Tablet Take 0.25 mg by mouth Once per day as needed for Anxiety 06/03/20  Yes Provider, Historical   atorvastatin (LIPITOR) 20 mg Oral Tablet Take 20 mg by mouth Every evening   Yes Provider, Historical   cyclobenzaprine (FLEXERIL) 5 mg Oral Tablet Take 5 mg by mouth Three times a day as needed for Muscle spasms   Yes Provider, Historical   escitalopram oxalate (LEXAPRO) 10 mg Oral Tablet Take 10 mg by mouth Every night   Yes Provider, Historical   Fish Oil-Omega-3 Fatty Acids 360-1,200 mg Oral Capsule Take 2 Caps by mouth Twice daily   Yes Provider, Historical   fluticasone (FLONASE) 50 mcg/actuation Nasal Spray, Suspension Administer 1 Spray into each nostril Once per day as needed (Allergy symptoms (nasal congestion/ runny nose))    Provider, Historical   JANUVIA 50 mg Oral Tablet Take 50 mg by mouth Every morning 04/19/20  Yes Provider, Historical   labetaloL (NORMODYNE) 100 mg Oral Tablet Take 0.5 Tablets (50 mg total) by  mouth Twice daily for 14 days Indications: Hold for SBP<120 or  HR<55  Patient taking differently: Take by mouth Take 0.5 (half) tablet twice daily on non-dialysis days (Sun/Tues/Thurs/Sat); Take once daily in the evening on dialysis days (Mon/Wed/Fri) Indications: Hold for SBP<120 or HR<55 10/20/20 11/03/20  Creta Levin, MD   loperamide (IMODIUM) 2 mg Oral Capsule Take 4 mg by mouth Every 6 hours as needed (Diarrhea)    Provider, Historical   loratadine (CLARITIN) 10 mg Oral Tablet Take 10 mg by mouth Once per day as needed (Allergies)   Yes Provider, Historical   losartan (COZAAR) 25 mg Oral Tablet Take 1 Tablet (25 mg total) by mouth Once a day for 30 days  Patient taking differently: Take 25 mg by mouth Take in the evening on non-dialysis days (Sun/Tues/Thurs/Sat); does not take on dialysis days 03/30/20 08/09/20  Ruta Hinds, MD   traMADoL Veatrice Bourbon) 50 mg Oral Tablet Take 1 Tablet (50 mg total) by mouth Every 6 hours as needed for Pain 10/28/20  Yes Kaylyn Lim, MD   vitamin E 100 unit Oral Capsule Take 100 Units by mouth Twice daily   Yes Provider, Historical   calcitrioL (ROCALTROL) 0.25 mcg Oral Capsule Take 0.25 mcg by mouth Once a day  Patient not taking: Reported on 11/18/2020  01/11/21  Provider, Historical       Social History:  Social History     Tobacco Use   . Smoking status: Never Smoker   . Smokeless tobacco: Never Used   Vaping Use   . Vaping Use: Never used   Substance Use Topics   . Alcohol use: No   . Drug use: No       Family History:  Family Medical History:     Problem Relation (Age of Onset)    Breast Cancer Paternal Aunt    Cancer Maternal Grandmother    Diabetes Father, Paternal Aunt    High Cholesterol Maternal Uncle, Mother    Hypertension (High Blood Pressure) Maternal Uncle, Father            OBJECTIVE:    Vitals:  BP (!) 172/96   Pulse 100   Temp 36.2 C (97.1 F) (Thermal Scan)   Ht 1.702 m (5\' 7" )   Wt 122 kg (270 lb)   LMP 10/24/2014 (Approximate)   BMI 42.29 kg/m         Physical examination:  Constitutional: Appears well, no acute  distress.    Respiratory: Non-labored breathing  GI: soft, not distended, no masses.   GU: No CVA tenderness, Bladder Non-tender  Musculoskeletal: Moves all extremities   Psychiatric: Mood and affect normal.     Labs:  Pertinent labs reviewed.      Imaging studies:  Pertinent imaging studies reviewed.      This note was completed using voice recognition technology. Please excuse any errors that have occurred as a result and contact me if any parts of the note are unclear.      I am in the presence of and scribing for Dr. Ruta Hinds, MD, for services provided on 01/27/2021.  Caryl Pina, SCRIBE  01/27/2021, 12:02    I personally performed the services described in this documentation, as scribed  in my presence, and it is both accurate  and complete.    Ruta Hinds, MD  01/27/2021, 16:20

## 2021-01-27 NOTE — Assessment & Plan Note (Signed)
Plan for surveillance MRI.

## 2021-02-01 ENCOUNTER — Ambulatory Visit
Admission: RE | Admit: 2021-02-01 | Discharge: 2021-02-01 | Disposition: A | Discharge status: Home / Self Care | Payer: 59 | Source: Ambulatory Visit | Attending: Gerontology | Admitting: Gerontology

## 2021-02-01 DIAGNOSIS — Z1231 Encounter for screening mammogram for malignant neoplasm of breast: Secondary | ICD-10-CM | POA: Insufficient documentation

## 2021-02-03 ENCOUNTER — Ambulatory Visit: Payer: 59 | Attending: Gerontology | Admitting: Gerontology

## 2021-02-03 ENCOUNTER — Encounter (RURAL_HEALTH_CENTER): Payer: Self-pay | Admitting: Gerontology

## 2021-02-03 ENCOUNTER — Telehealth (INDEPENDENT_AMBULATORY_CARE_PROVIDER_SITE_OTHER): Payer: Self-pay | Admitting: GASTROENTEROLOGY

## 2021-02-03 DIAGNOSIS — Z01419 Encounter for gynecological examination (general) (routine) without abnormal findings: Secondary | ICD-10-CM

## 2021-02-03 DIAGNOSIS — R195 Other fecal abnormalities: Secondary | ICD-10-CM

## 2021-02-03 DIAGNOSIS — Z124 Encounter for screening for malignant neoplasm of cervix: Secondary | ICD-10-CM

## 2021-02-03 DIAGNOSIS — Z1211 Encounter for screening for malignant neoplasm of colon: Secondary | ICD-10-CM

## 2021-02-03 DIAGNOSIS — Z1212 Encounter for screening for malignant neoplasm of rectum: Secondary | ICD-10-CM

## 2021-02-03 DIAGNOSIS — Z9189 Other specified personal risk factors, not elsewhere classified: Secondary | ICD-10-CM

## 2021-02-03 LAB — POCT OCCULT BLOOD STOOL: Stool Occult Blood: POSITIVE — AB

## 2021-02-03 NOTE — Progress Notes (Signed)
Subjective:     Patient ID: EULAMAE KUNCE is a 55 y.o. female.  Chief Complaint   Patient presents with   . Gynecologic Exam     Annual pap       HPI: She is due for well woman exam, including pap, per her report last pap was in 2020, with [facility].  She just completed her mammogram and declines need for breast exam.  She is on HD for a h/o clear cell carcinoma of left kidney.    She does endorse more constipation with supplemental iron and a h/o hemorrhoids, she does have occ blood in stool    The following portions of the patient's history were reviewed and updated as appropriate: allergies, current medications, past family history, past medical history, past social history, past surgical history and problem list.    Social History     Socioeconomic History   . Marital status: Married   Tobacco Use   . Smoking status: Never Smoker   . Smokeless tobacco: Never Used   Vaping Use   . Vaping Use: Never used   Substance and Sexual Activity   . Alcohol use: No     Alcohol/week: 0.0 standard drinks   . Drug use: No   . Sexual activity: Yes     Review of Systems   Constitutional: Negative for fatigue and fever.   HENT: Negative.    Respiratory: Negative for shortness of breath.    Gastrointestinal: Positive for blood in stool and constipation.   Genitourinary: Negative for difficulty urinating, genital sores, hematuria, menstrual problem, pelvic pain, vaginal bleeding, vaginal discharge and vaginal pain.                       Objective:    Physical Exam  Vitals and nursing note reviewed. Exam conducted with a chaperone present.   Constitutional:       Appearance: Normal appearance.   HENT:      Head: Normocephalic.   Cardiovascular:      Rate and Rhythm: Normal rate and regular rhythm.   Pulmonary:      Effort: Pulmonary effort is normal.   Chest:      Comments: Deferred at her request, just completed mammogram  Abdominal:      General: Bowel sounds are normal. There is no distension.      Tenderness:  There is no abdominal tenderness.      Hernia: There is no hernia in the left inguinal area or right inguinal area.   Genitourinary:     Exam position: Lithotomy position.      Labia:         Right: No rash or tenderness.         Left: No rash or tenderness.       Urethra: No prolapse.      Vagina: Normal. No vaginal discharge or tenderness.      Cervix: Friability present. No lesion.      Uterus: Normal.       Adnexa: Right adnexa normal and left adnexa normal.        Right: No mass or tenderness.          Left: No mass or tenderness.        Rectum: Guaiac result positive. Internal hemorrhoid present. Normal anal tone.   Lymphadenopathy:      Lower Body: No right inguinal adenopathy. No left inguinal adenopathy.   Neurological:  Mental Status: She is alert.       Blood pressure 152/90, pulse 80, temperature 98.1 F (36.7 C), temperature source Temporal, resp. rate 17, height 1.702 m ('5\' 7"'$ ), weight 124.2 kg (273 lb 12.8 oz).      Mammo Screening w/Tomo Bilateral    Result Date: 02/01/2021  Negative mammogram. BI-RADS 1 - Negative Recommendation: Yearly screening mammogram. The patient is entered into our reminder system and a reminder letter will be sent to the patient for follow-up at one year. About 10% of malignancies will not show up on mammography.  A negative x-ray report should not delay biopsy if a clinically suspicious mass is present. ReadingStation:[id number]      Medical Decision Making:   1. Well woman exam with routine gynecological exam  - Liquid Based Pap; Future  - POCT Occult blood stool  POSITVE today  Please review PE above  2. Screening for cervical cancer  - Liquid Based Pap; Future  today    3. Encounter for screening fecal occult blood testing  - POCT Occult blood stool    4. Encounter for screening for colorectal cancer in high risk patient  - Ambulatory referral to Gastroenterology  History of clear cell carcinoma left kidney with nephrectomy, currently on HD  5. Stool guaiac  positive  - Ambulatory referral to Gastroenterology  Currently on HD, iron supplements, constipation and internal hemorrhoid  Never had a colonoscopy, she wants to be considered for kidney transplant    No follow-ups on file.  3 months      Follow up sooner if no improvement or if symptoms worsen or persist.   Medication dosing instructions, drug-drug interactions and potential side effects discussed in detail.   Failure to comply with plan of care discussed with patient. Importance of adhering to follow up office visits discussed in detail.   Potential risks of noncompliance discussed with patient.    Ocie Doyne, NP

## 2021-02-03 NOTE — Telephone Encounter (Signed)
Called [facility], regarding referral.    Referral  Screening for colorectal cancer in high risk patient, stool guaiac positive.    Advised, [name], pt could be sent to [facility] who can do the colonoscopy sooner then later. Based on scheduling, we are scheduling in October.     [name] will check with the doctor and see what they want to do. Provided GS phone number.     Referral is scanned in media.     [name] Mech

## 2021-02-05 ENCOUNTER — Other Ambulatory Visit
Admission: RE | Admit: 2021-02-05 | Discharge: 2021-02-05 | Disposition: A | Discharge status: Home / Self Care | Payer: 59 | Source: Ambulatory Visit | Attending: Gerontology | Admitting: Gerontology

## 2021-02-05 DIAGNOSIS — Z124 Encounter for screening for malignant neoplasm of cervix: Secondary | ICD-10-CM

## 2021-02-05 DIAGNOSIS — Z01419 Encounter for gynecological examination (general) (routine) without abnormal findings: Secondary | ICD-10-CM

## 2021-02-07 ENCOUNTER — Encounter (HOSPITAL_COMMUNITY): Payer: Self-pay

## 2021-02-10 ENCOUNTER — Other Ambulatory Visit (RURAL_HEALTH_CENTER): Payer: Self-pay | Admitting: Gerontology

## 2021-02-10 MED ORDER — CYCLOBENZAPRINE HCL 5 MG PO TABS
5.0000 mg | ORAL_TABLET | Freq: Three times a day (TID) | ORAL | 1 refills | Status: DC | PRN
Start: 2021-02-10 — End: 2021-04-08

## 2021-02-10 NOTE — Telephone Encounter (Signed)
Patient called requesting a refill on flexeril 5 mg to be sent to [facility] in [place].

## 2021-02-15 ENCOUNTER — Telehealth (INDEPENDENT_AMBULATORY_CARE_PROVIDER_SITE_OTHER): Payer: Self-pay | Admitting: Urology

## 2021-02-15 DIAGNOSIS — Z01812 Encounter for preprocedural laboratory examination: Secondary | ICD-10-CM

## 2021-02-15 NOTE — Telephone Encounter (Signed)
Received call from radiology requesting lab order for patients upcoming imaging order by Dr. Kerin Perna.  Order placed.Galen Manila, LPN  10/05/5518, 80:22

## 2021-02-19 ENCOUNTER — Ambulatory Visit: Payer: 59 | Attending: Urology

## 2021-02-19 ENCOUNTER — Other Ambulatory Visit: Payer: Self-pay

## 2021-02-19 DIAGNOSIS — Z01812 Encounter for preprocedural laboratory examination: Secondary | ICD-10-CM | POA: Insufficient documentation

## 2021-02-19 LAB — BASIC METABOLIC PANEL
ANION GAP: 12 mmol/L (ref 4–13)
BUN/CREA RATIO: 5 — ABNORMAL LOW (ref 6–22)
BUN: 22 mg/dL (ref 8–25)
CALCIUM: 9.6 mg/dL (ref 8.5–10.0)
CHLORIDE: 95 mmol/L — ABNORMAL LOW (ref 96–111)
CO2 TOTAL: 33 mmol/L — ABNORMAL HIGH (ref 22–30)
CREATININE: 4.44 mg/dL — ABNORMAL HIGH (ref 0.60–1.05)
ESTIMATED GFR: 11 mL/min/BSA — ABNORMAL LOW (ref >=60)
GLUCOSE: 208 mg/dL — ABNORMAL HIGH (ref 65–125)
POTASSIUM: 4.4 mmol/L (ref 3.5–5.1)
SODIUM: 140 mmol/L (ref 136–145)

## 2021-02-22 ENCOUNTER — Ambulatory Visit
Admission: RE | Admit: 2021-02-22 | Discharge: 2021-02-22 | Disposition: A | Discharge status: Home / Self Care | Payer: 59 | Source: Ambulatory Visit | Attending: Urology | Admitting: Urology

## 2021-02-22 ENCOUNTER — Other Ambulatory Visit: Payer: Self-pay

## 2021-02-22 DIAGNOSIS — N185 Chronic kidney disease, stage 5: Secondary | ICD-10-CM | POA: Insufficient documentation

## 2021-02-22 DIAGNOSIS — I517 Cardiomegaly: Secondary | ICD-10-CM

## 2021-02-22 DIAGNOSIS — C642 Malignant neoplasm of left kidney, except renal pelvis: Secondary | ICD-10-CM | POA: Insufficient documentation

## 2021-02-22 DIAGNOSIS — K838 Other specified diseases of biliary tract: Secondary | ICD-10-CM

## 2021-02-22 DIAGNOSIS — K7689 Other specified diseases of liver: Secondary | ICD-10-CM

## 2021-02-22 MED ORDER — GADOTERIDOL 279.3 MG/ML INTRAVENOUS SOLUTION
20.0000 mL | INTRAVENOUS | Status: AC
Start: 2021-02-22 — End: 2021-02-22
  Administered 2021-02-22: 20 mL via INTRAVENOUS
  Filled 2021-02-22: qty 20

## 2021-02-24 ENCOUNTER — Telehealth (INDEPENDENT_AMBULATORY_CARE_PROVIDER_SITE_OTHER): Payer: Self-pay | Admitting: Urology

## 2021-02-24 NOTE — Telephone Encounter (Addendum)
LVM with patient to discuss rescheduling their appt to 6 months out after another MRI. [name]  02/24/2021, 13:20         ----- Message from Ruta Hinds, MD sent at 02/23/2021  1:52 PM EDT -----  Regarding: RE: MRI Results  We'll have her f/u in 6 months with another MRI    ----- Message -----  From: Geralynn Ochs  Sent: 02/23/2021   9:16 AM EDT  To: Ruta Hinds, MD  Subject: MRI Results                                      Patient wants to speak to you about their MRI results. I do have her scheduled to see you in July but if you are able to give her a call to discuss the results let me know and I will remove her from the schedule. Thank you! [name]  02/23/2021, 09:16

## 2021-03-04 ENCOUNTER — Other Ambulatory Visit (INDEPENDENT_AMBULATORY_CARE_PROVIDER_SITE_OTHER): Payer: Self-pay | Admitting: Urology

## 2021-03-04 DIAGNOSIS — C642 Malignant neoplasm of left kidney, except renal pelvis: Secondary | ICD-10-CM

## 2021-03-14 ENCOUNTER — Other Ambulatory Visit (RURAL_HEALTH_CENTER): Payer: Self-pay

## 2021-03-14 MED ORDER — ESCITALOPRAM OXALATE 10 MG PO TABS
10.0000 mg | ORAL_TABLET | Freq: Every day | ORAL | 2 refills | Status: DC
Start: 2021-03-14 — End: 2021-12-15

## 2021-03-14 NOTE — Telephone Encounter (Signed)
Patient called requesting RF of her Lexapro.

## 2021-04-06 ENCOUNTER — Encounter (RURAL_HEALTH_CENTER): Payer: Self-pay

## 2021-04-08 ENCOUNTER — Ambulatory Visit: Payer: 59 | Attending: Gerontology | Admitting: Gerontology

## 2021-04-08 ENCOUNTER — Encounter (RURAL_HEALTH_CENTER): Payer: Self-pay | Admitting: Gerontology

## 2021-04-08 VITALS — BP 128/82 | HR 84 | Temp 97.9°F | Resp 17 | Ht 67.0 in | Wt 269.9 lb

## 2021-04-08 DIAGNOSIS — M5441 Lumbago with sciatica, right side: Secondary | ICD-10-CM

## 2021-04-08 MED ORDER — KETOROLAC TROMETHAMINE 30 MG/ML IJ SOLN
30.0000 mg | Freq: Once | INTRAMUSCULAR | Status: AC
Start: 2021-04-08 — End: 2021-04-08
  Administered 2021-04-08: 16:00:00 30 mg via INTRAVENOUS

## 2021-04-08 MED ORDER — TRAMADOL HCL 50 MG PO TABS
50.0000 mg | ORAL_TABLET | Freq: Three times a day (TID) | ORAL | 2 refills | Status: DC | PRN
Start: 2021-04-08 — End: 2021-08-08

## 2021-04-08 MED ORDER — CYCLOBENZAPRINE HCL 5 MG PO TABS
5.0000 mg | ORAL_TABLET | Freq: Three times a day (TID) | ORAL | 1 refills | Status: DC | PRN
Start: 2021-04-08 — End: 2021-10-25

## 2021-04-08 MED ORDER — METHYLPREDNISOLONE 4 MG PO TBPK
ORAL_TABLET | ORAL | 0 refills | Status: DC
Start: 2021-04-08 — End: 2021-04-19

## 2021-04-08 NOTE — Progress Notes (Signed)
Subjective:     Patient ID: Kelli Carlson is a 55 y.o. female.  Chief Complaint   Patient presents with    Sciatica     Ongoing, has not gotten better       HPI: Sciatica low back to right leg, does as far to right knee    The following portions of the patient's history were reviewed and updated as appropriate: allergies, current medications, past family history, past medical history, past social history, past surgical history and problem list.    Social History     Socioeconomic History    Marital status: Married   Tobacco Use    Smoking status: Never    Smokeless tobacco: Never   Vaping Use    Vaping Use: Never used   Substance and Sexual Activity    Alcohol use: No     Alcohol/week: 0.0 standard drinks    Drug use: No    Sexual activity: Yes     Review of Systems   Constitutional:  Positive for fatigue.   Musculoskeletal:  Positive for arthralgias, back pain and gait problem.   Neurological:  Positive for weakness.                     Objective:    Physical Exam  Vitals and nursing note reviewed.   Constitutional:       Appearance: Normal appearance.   HENT:      Head: Normocephalic.   Musculoskeletal:         General: Tenderness present.      Lumbar back: Spasms and tenderness present. Decreased range of motion.   Neurological:      Mental Status: She is alert.     Blood pressure 128/82, pulse 84, temperature 97.9 F (36.6 C), temperature source Temporal, resp. rate 17, height 1.702 m ('5\' 7"'$ ), weight 122.4 kg (269 lb 14.4 oz).        Medical Decision Making:   1. Acute right-sided low back pain with right-sided sciatica  - methylPREDNISolone (MEDROL DOSEPAK) 4 MG tablet; follow package directions  Dispense: 21 tablet; Refill: 0  - traMADol (ULTRAM) 50 MG tablet; Take 1 tablet (50 mg total) by mouth every 8 (eight) hours as needed for Pain  Dispense: 30 tablet; Refill: 2  - cyclobenzaprine (FLEXERIL) 5 MG tablet; Take 1 tablet (5 mg total) by mouth 3 (three) times daily as needed for Muscle spasms  Dispense:  30 tablet; Refill: 1  - ketorolac (TORADOL) injection 30 mg    Return in about 10 days (around 04/18/2021).      Follow up sooner if no improvement or if symptoms worsen or persist.   Medication dosing instructions, drug-drug interactions and potential side effects discussed in detail.   Failure to comply with plan of care discussed with patient. Importance of adhering to follow up office visits discussed in detail.   Potential risks of noncompliance discussed with patient.    Ocie Doyne, NP

## 2021-04-08 NOTE — Progress Notes (Signed)
Patient received Toradol by slow IV Push.   Verbal Consent Obtained.     RAC x1 attempt.   Followed by saline flush.   See MAR for more details.   Patient tolerated venipuncture and medication well.   No immediate reaction noted at this time.   Advised patient to call office with any questions and/or concerns.   Patient voices understanding.

## 2021-04-11 ENCOUNTER — Encounter (INDEPENDENT_AMBULATORY_CARE_PROVIDER_SITE_OTHER): Payer: Self-pay | Admitting: Urology

## 2021-04-12 ENCOUNTER — Telehealth (INDEPENDENT_AMBULATORY_CARE_PROVIDER_SITE_OTHER): Payer: Self-pay | Admitting: GASTROENTEROLOGY

## 2021-04-12 NOTE — Telephone Encounter (Signed)
LVM schedule from referral.  Colorectal cancer in high risk pt.   Stool guaisc positive.    Dr. Tobie Poet

## 2021-04-19 ENCOUNTER — Encounter (RURAL_HEALTH_CENTER): Payer: Self-pay | Admitting: Gerontology

## 2021-04-19 ENCOUNTER — Ambulatory Visit: Payer: 59 | Attending: Gerontology | Admitting: Gerontology

## 2021-04-19 VITALS — BP 144/86 | HR 74 | Temp 97.6°F | Ht 67.0 in | Wt 270.7 lb

## 2021-04-19 DIAGNOSIS — M5441 Lumbago with sciatica, right side: Secondary | ICD-10-CM

## 2021-04-19 NOTE — Progress Notes (Signed)
Subjective:     Patient ID: Kelli Carlson is a 54 y.o. female.  Chief Complaint   Patient presents with    Sciatica     10 day follow up on right sided sciatica pain       HPI: Improved per her report, the medrol dose pack, was effective with flexeril as needed, still occ low back pain to right leg but manageable    The following portions of the patient's history were reviewed and updated as appropriate: allergies, current medications, past family history, past medical history, past social history, past surgical history and problem list.    Social History     Socioeconomic History    Marital status: Married   Tobacco Use    Smoking status: Never    Smokeless tobacco: Never   Vaping Use    Vaping Use: Never used   Substance and Sexual Activity    Alcohol use: No     Alcohol/week: 0.0 standard drinks    Drug use: No    Sexual activity: Yes     Review of Systems   Musculoskeletal:  Positive for back pain.   Neurological:  Negative for weakness and numbness.                     Objective:    Physical Exam  Vitals and nursing note reviewed.   Constitutional:       Appearance: Normal appearance.   Musculoskeletal:         General: Tenderness present.   Neurological:      Mental Status: She is alert.     Blood pressure 144/86, pulse 74, temperature 97.6 F (36.4 C), temperature source Temporal, height 1.702 m ('5\' 7"'$ ), weight 122.8 kg (270 lb 11.2 oz).        Medical Decision Making:   1. Acute right-sided low back pain with right-sided sciatica  Treatment with Medrol dose pack was effective, she reports prn use of the Flexeril. She will continue with gentle stretches, good firm footwear, avoid shoes with no back, may use prn tylenol avoid NSAIDS related to CKD on dialysis  Return in about 4 weeks (around 05/17/2021).      Follow up sooner if no improvement or if symptoms worsen or persist.   Medication dosing instructions, drug-drug interactions and potential side effects discussed in detail.   Failure to comply with  plan of care discussed with patient. Importance of adhering to follow up office visits discussed in detail.   Potential risks of noncompliance discussed with patient.    Ocie Doyne, NP

## 2021-05-16 ENCOUNTER — Ambulatory Visit: Payer: Self-pay | Admitting: Family Medicine

## 2021-05-17 ENCOUNTER — Encounter (RURAL_HEALTH_CENTER): Payer: Self-pay | Admitting: Gerontology

## 2021-05-17 ENCOUNTER — Ambulatory Visit: Payer: 59 | Attending: Gerontology | Admitting: Gerontology

## 2021-05-17 VITALS — BP 124/68 | HR 76 | Temp 98.0°F | Resp 16 | Ht 67.0 in | Wt 273.0 lb

## 2021-05-17 DIAGNOSIS — M5441 Lumbago with sciatica, right side: Secondary | ICD-10-CM

## 2021-05-17 MED ORDER — KETOROLAC TROMETHAMINE 15 MG/ML IJ SOLN
30.0000 mg | Freq: Once | INTRAMUSCULAR | Status: AC
Start: 2021-05-17 — End: 2021-05-22

## 2021-05-19 ENCOUNTER — Ambulatory Visit (RURAL_HEALTH_CENTER): Payer: Self-pay | Admitting: Gerontology

## 2021-05-19 ENCOUNTER — Encounter (RURAL_HEALTH_CENTER): Payer: Self-pay | Admitting: Gerontology

## 2021-05-19 MED ORDER — KETOROLAC TROMETHAMINE 30 MG/ML IJ SOLN
30.0000 mg | Freq: Once | INTRAMUSCULAR | Status: AC
Start: 2021-05-19 — End: 2021-05-17
  Administered 2021-05-17: 14:00:00 30 mg via INTRAVENOUS

## 2021-05-19 NOTE — Progress Notes (Signed)
Subjective:     Patient ID: Kelli Carlson is a 55 y.o. female.  Chief Complaint   Patient presents with    Sciatica     Right side- requesting Toradol shot       HPI: Recurrent acute  low back pain with sciatica into right buttock and leg, no bowel or bladder issues, no weakness, no falls    The following portions of the patient's history were reviewed and updated as appropriate: allergies, current medications, past family history, past medical history, past social history, past surgical history and problem list.    Social History     Socioeconomic History    Marital status: Married   Tobacco Use    Smoking status: Never    Smokeless tobacco: Never   Vaping Use    Vaping Use: Never used   Substance and Sexual Activity    Alcohol use: No     Alcohol/week: 0.0 standard drinks    Drug use: No    Sexual activity: Yes     Review of Systems   Constitutional: Negative.    Musculoskeletal:  Positive for arthralgias, back pain and gait problem.   Neurological:  Positive for numbness.                     Objective:    Physical Exam  Vitals and nursing note reviewed.   Constitutional:       Appearance: Normal appearance.   Musculoskeletal:      Lumbar back: Spasms and tenderness present. Decreased range of motion.        Back:    Neurological:      Mental Status: She is alert.     Blood pressure 124/68, pulse 76, temperature 98 F (36.7 C), temperature source Temporal, resp. rate 16, height 1.702 m ('5\' 7"'$ ), weight 123.8 kg (273 lb).        Medical Decision Making:   1. Acute right-sided low back pain with right-sided sciatica  - ketorolac (TORADOL) injection 30 mg  Continue alternating ice and heal 15 min on and off up to QID prn  Wear good fitting shoes with good heel support  Change positions, may need to have small stool and elevate one leg alternating when standing long stretches      No follow-ups on file.      Follow up sooner if no improvement or if symptoms worsen or persist.   Medication dosing instructions,  drug-drug interactions and potential side effects discussed in detail.   Failure to comply with plan of care discussed with patient. Importance of adhering to follow up office visits discussed in detail.   Potential risks of noncompliance discussed with patient.    Ocie Doyne, NP

## 2021-05-19 NOTE — Progress Notes (Unsigned)
05/17/21      Patient received Toradol by slow IV Push.   Verbal Consent Obtained.     RAC x1 attempt.   Followed by saline flush.   See MAR for more details.   Patient tolerated venipuncture and medication well.   No immediate reaction noted at this time.   Advised patient to call office with any questions and/or concerns.   Patient voices understanding.

## 2021-05-25 ENCOUNTER — Other Ambulatory Visit: Payer: Self-pay

## 2021-05-25 ENCOUNTER — Encounter (HOSPITAL_COMMUNITY): Payer: Self-pay

## 2021-05-25 ENCOUNTER — Emergency Department (HOSPITAL_COMMUNITY): Payer: 59

## 2021-05-25 ENCOUNTER — Observation Stay (HOSPITAL_BASED_OUTPATIENT_CLINIC_OR_DEPARTMENT_OTHER): Payer: 59

## 2021-05-25 ENCOUNTER — Observation Stay
Admission: EM | Admit: 2021-05-25 | Discharge: 2021-05-26 | Disposition: A | Discharge status: Home / Self Care | Payer: 59 | Attending: Internal Medicine | Admitting: Internal Medicine

## 2021-05-25 DIAGNOSIS — R778 Other specified abnormalities of plasma proteins: Secondary | ICD-10-CM | POA: Insufficient documentation

## 2021-05-25 DIAGNOSIS — Z85528 Personal history of other malignant neoplasm of kidney: Secondary | ICD-10-CM | POA: Insufficient documentation

## 2021-05-25 DIAGNOSIS — F32A Depression, unspecified: Secondary | ICD-10-CM | POA: Insufficient documentation

## 2021-05-25 DIAGNOSIS — E119 Type 2 diabetes mellitus without complications: Secondary | ICD-10-CM | POA: Diagnosis present

## 2021-05-25 DIAGNOSIS — R519 Headache, unspecified: Secondary | ICD-10-CM

## 2021-05-25 DIAGNOSIS — Z905 Acquired absence of kidney: Secondary | ICD-10-CM | POA: Insufficient documentation

## 2021-05-25 DIAGNOSIS — Z992 Dependence on renal dialysis: Secondary | ICD-10-CM | POA: Insufficient documentation

## 2021-05-25 DIAGNOSIS — I160 Hypertensive urgency: Principal | ICD-10-CM | POA: Insufficient documentation

## 2021-05-25 DIAGNOSIS — F419 Anxiety disorder, unspecified: Secondary | ICD-10-CM | POA: Insufficient documentation

## 2021-05-25 DIAGNOSIS — Z79899 Other long term (current) drug therapy: Secondary | ICD-10-CM | POA: Insufficient documentation

## 2021-05-25 DIAGNOSIS — N185 Chronic kidney disease, stage 5: Secondary | ICD-10-CM | POA: Diagnosis present

## 2021-05-25 DIAGNOSIS — R7989 Other specified abnormal findings of blood chemistry: Secondary | ICD-10-CM | POA: Diagnosis present

## 2021-05-25 DIAGNOSIS — M25519 Pain in unspecified shoulder: Secondary | ICD-10-CM

## 2021-05-25 DIAGNOSIS — I10 Essential (primary) hypertension: Secondary | ICD-10-CM | POA: Diagnosis present

## 2021-05-25 DIAGNOSIS — M542 Cervicalgia: Secondary | ICD-10-CM

## 2021-05-25 DIAGNOSIS — I120 Hypertensive chronic kidney disease with stage 5 chronic kidney disease or end stage renal disease: Secondary | ICD-10-CM | POA: Insufficient documentation

## 2021-05-25 DIAGNOSIS — E1122 Type 2 diabetes mellitus with diabetic chronic kidney disease: Secondary | ICD-10-CM | POA: Insufficient documentation

## 2021-05-25 DIAGNOSIS — N2581 Secondary hyperparathyroidism of renal origin: Secondary | ICD-10-CM | POA: Insufficient documentation

## 2021-05-25 DIAGNOSIS — Z7984 Long term (current) use of oral hypoglycemic drugs: Secondary | ICD-10-CM | POA: Insufficient documentation

## 2021-05-25 DIAGNOSIS — Z20822 Contact with and (suspected) exposure to covid-19: Secondary | ICD-10-CM | POA: Insufficient documentation

## 2021-05-25 DIAGNOSIS — I214 Non-ST elevation (NSTEMI) myocardial infarction: Secondary | ICD-10-CM

## 2021-05-25 DIAGNOSIS — Z8616 Personal history of COVID-19: Secondary | ICD-10-CM | POA: Insufficient documentation

## 2021-05-25 DIAGNOSIS — N186 End stage renal disease: Secondary | ICD-10-CM | POA: Insufficient documentation

## 2021-05-25 HISTORY — DX: End stage renal disease: N18.6

## 2021-05-25 LAB — BLUE TOP TUBE

## 2021-05-25 LAB — COVID-19, FLU A/B, RSV RAPID BY PCR
INFLUENZA VIRUS TYPE A: NOT DETECTED
INFLUENZA VIRUS TYPE B: NOT DETECTED
RESPIRATORY SYNCTIAL VIRUS (RSV): NOT DETECTED
SARS-CoV-2: NOT DETECTED

## 2021-05-25 LAB — CBC WITH DIFF
BASOPHIL #: <0.1 10*3/uL (ref <=0.20)
BASOPHIL %: 0 %
EOSINOPHIL #: <0.1 10*3/uL (ref <=0.50)
EOSINOPHIL %: 0 %
HCT: 32.9 % — ABNORMAL LOW (ref 34.8–46.0)
HGB: 10.9 g/dL — ABNORMAL LOW (ref 11.5–16.0)
IMMATURE GRANULOCYTE #: <0.1 10*3/uL (ref <0.10)
IMMATURE GRANULOCYTE %: 1 % (ref 0–1)
LYMPHOCYTE #: 0.66 10*3/uL — ABNORMAL LOW (ref 1.00–4.80)
LYMPHOCYTE %: 6 %
MCH: 35.6 pg — ABNORMAL HIGH (ref 26.0–32.0)
MCHC: 33.1 g/dL (ref 31.0–35.5)
MCV: 107.5 fL — ABNORMAL HIGH (ref 78.0–100.0)
MONOCYTE #: 0.36 10*3/uL (ref 0.20–1.10)
MONOCYTE %: 3 %
MPV: 10.6 fL (ref 8.7–12.5)
NEUTROPHIL #: 9.46 10*3/uL — ABNORMAL HIGH (ref 1.50–7.70)
NEUTROPHIL %: 90 %
PLATELETS: 158 10*3/uL (ref 150–400)
RBC: 3.06 10*6/uL — ABNORMAL LOW (ref 3.85–5.22)
RDW-CV: 17.9 % — ABNORMAL HIGH (ref 11.5–15.5)
WBC: 10.6 10*3/uL (ref 3.7–11.0)

## 2021-05-25 LAB — COMPREHENSIVE METABOLIC PANEL, NON-FASTING
ALBUMIN: 3.5 g/dL (ref 3.5–5.0)
ALKALINE PHOSPHATASE: 80 U/L (ref 50–130)
ALT (SGPT): 34 U/L — ABNORMAL HIGH (ref 8–22)
ANION GAP: 13 mmol/L (ref 4–13)
AST (SGOT): 28 U/L (ref 8–45)
BILIRUBIN TOTAL: 0.8 mg/dL (ref 0.3–1.3)
BUN/CREA RATIO: 5 — ABNORMAL LOW (ref 6–22)
BUN: 21 mg/dL (ref 8–25)
CALCIUM: 9.9 mg/dL (ref 8.5–10.0)
CHLORIDE: 95 mmol/L — ABNORMAL LOW (ref 96–111)
CO2 TOTAL: 30 mmol/L (ref 22–30)
CREATININE: 4.26 mg/dL — ABNORMAL HIGH (ref 0.60–1.05)
ESTIMATED GFR: 12 mL/min/BSA — ABNORMAL LOW (ref >=60)
GLUCOSE: 175 mg/dL — ABNORMAL HIGH (ref 65–125)
POTASSIUM: 4.3 mmol/L (ref 3.5–5.1)
PROTEIN TOTAL: 7 g/dL (ref 6.4–8.3)
SODIUM: 138 mmol/L (ref 136–145)

## 2021-05-25 LAB — POC FINGERSTICK GLUCOSE - BMC/JMC (RESULTS): GLUCOSE, POC: 145 mg/dl — ABNORMAL HIGH (ref 60–100)

## 2021-05-25 LAB — RED TOP TUBE

## 2021-05-25 LAB — TROPONIN-I
TROPONIN I: 1645 ng/L (ref 7–30)
TROPONIN I: 1469 ng/L (ref 7–30)

## 2021-05-25 LAB — LIGHT GREEN TOP TUBE

## 2021-05-25 LAB — GOLD TOP TUBE

## 2021-05-25 MED ORDER — SODIUM CHLORIDE 0.9 % (FLUSH) INJECTION SYRINGE
10.0000 mL | INJECTION | Freq: Three times a day (TID) | INTRAMUSCULAR | Status: DC
Start: 2021-05-25 — End: 2021-05-26
  Administered 2021-05-25 – 2021-05-26 (×2): 10 mL via INTRAVENOUS
  Administered 2021-05-26: 0 mL via INTRAVENOUS

## 2021-05-25 MED ORDER — ASPIRIN 81 MG CHEWABLE TABLET
324.0000 mg | CHEWABLE_TABLET | ORAL | Status: AC
Start: 2021-05-25 — End: 2021-05-25
  Administered 2021-05-25: 324 mg via ORAL
  Filled 2021-05-25: qty 4

## 2021-05-25 MED ORDER — DEXTROSE 50 % IN WATER (D50W) INTRAVENOUS SYRINGE
12.5000 g | INJECTION | INTRAVENOUS | Status: DC | PRN
Start: 2021-05-25 — End: 2021-05-26

## 2021-05-25 MED ORDER — PROCHLORPERAZINE EDISYLATE 10 MG/2 ML (5 MG/ML) INJECTION SOLUTION
10.0000 mg | INTRAMUSCULAR | Status: AC
Start: 2021-05-25 — End: 2021-05-25
  Administered 2021-05-25 (×2): 10 mg via INTRAVENOUS
  Filled 2021-05-25: qty 2

## 2021-05-25 MED ORDER — HYDRALAZINE 20 MG/ML INJECTION SOLUTION
10.0000 mg | Freq: Three times a day (TID) | INTRAMUSCULAR | Status: DC | PRN
Start: 2021-05-25 — End: 2021-05-26
  Administered 2021-05-25: 10 mg via INTRAVENOUS
  Filled 2021-05-25: qty 1

## 2021-05-25 MED ORDER — NITROGLYCERIN 0.4 MG SUBLINGUAL TABLET
0.4000 mg | SUBLINGUAL_TABLET | SUBLINGUAL | Status: DC | PRN
Start: 2021-05-25 — End: 2021-05-25

## 2021-05-25 MED ORDER — SODIUM CHLORIDE 0.9 % (FLUSH) INJECTION SYRINGE
10.0000 mL | INJECTION | INTRAMUSCULAR | Status: DC | PRN
Start: 2021-05-25 — End: 2021-05-26

## 2021-05-25 MED ORDER — ONDANSETRON HCL (PF) 4 MG/2 ML INJECTION SOLUTION
4.0000 mg | Freq: Three times a day (TID) | INTRAMUSCULAR | Status: DC | PRN
Start: 2021-05-25 — End: 2021-05-26

## 2021-05-25 MED ORDER — NITROGLYCERIN 0.4 MG/HR TRANSDERMAL 24 HOUR PATCH
0.4000 mg | MEDICATED_PATCH | Freq: Once | TRANSDERMAL | Status: AC
Start: 2021-05-25 — End: 2021-05-26
  Administered 2021-05-25 (×2): 0.4 mg via TRANSDERMAL
  Filled 2021-05-25 (×2): qty 1

## 2021-05-25 MED ORDER — ATORVASTATIN 20 MG TABLET
20.0000 mg | ORAL_TABLET | Freq: Every evening | ORAL | Status: DC
Start: 2021-05-25 — End: 2021-05-26
  Administered 2021-05-25: 20 mg via ORAL
  Filled 2021-05-25: qty 1

## 2021-05-25 MED ORDER — ACETAMINOPHEN 500 MG TABLET
1000.0000 mg | ORAL_TABLET | ORAL | Status: AC
Start: 2021-05-25 — End: 2021-05-25
  Administered 2021-05-25: 1000 mg via ORAL
  Filled 2021-05-25: qty 2

## 2021-05-25 MED ORDER — INSULIN LISPRO 100 UNIT/ML INJECTION SSIP - CITY
1.0000 [IU] | Freq: Four times a day (QID) | SUBCUTANEOUS | Status: DC
Start: 2021-05-25 — End: 2021-05-26
  Administered 2021-05-25 – 2021-05-26 (×3): 0 [IU] via SUBCUTANEOUS
  Filled 2021-05-25: qty 300

## 2021-05-25 MED ORDER — NITROGLYCERIN 0.4 MG SUBLINGUAL TABLET
0.4000 mg | SUBLINGUAL_TABLET | SUBLINGUAL | Status: DC | PRN
Start: 2021-05-25 — End: 2021-05-25
  Administered 2021-05-25: 0.4 mg via SUBLINGUAL
  Filled 2021-05-25: qty 1

## 2021-05-25 MED ORDER — LINAGLIPTIN 5 MG TABLET
5.0000 mg | ORAL_TABLET | Freq: Every day | ORAL | Status: DC
Start: 2021-05-25 — End: 2021-05-26
  Administered 2021-05-25: 0 mg via ORAL
  Administered 2021-05-26: 5 mg via ORAL
  Filled 2021-05-25 (×2): qty 1

## 2021-05-25 MED ORDER — LABETALOL 100 MG TABLET
100.0000 mg | ORAL_TABLET | Freq: Two times a day (BID) | ORAL | Status: DC
Start: 2021-05-25 — End: 2021-05-26
  Administered 2021-05-25: 100 mg via ORAL
  Filled 2021-05-25: qty 1

## 2021-05-25 MED ORDER — LOPERAMIDE 2 MG CAPSULE
4.0000 mg | ORAL_CAPSULE | Freq: Four times a day (QID) | ORAL | Status: DC | PRN
Start: 2021-05-25 — End: 2021-05-26

## 2021-05-25 MED ORDER — DIPHENHYDRAMINE 50 MG/ML INJECTION SOLUTION
25.0000 mg | INTRAMUSCULAR | Status: AC
Start: 2021-05-25 — End: 2021-05-25
  Administered 2021-05-25: 25 mg via INTRAVENOUS
  Filled 2021-05-25: qty 1

## 2021-05-25 MED ORDER — TRAMADOL 50 MG TABLET
50.0000 mg | ORAL_TABLET | Freq: Three times a day (TID) | ORAL | Status: DC | PRN
Start: 2021-05-25 — End: 2021-05-26
  Administered 2021-05-26 (×2): 50 mg via ORAL
  Filled 2021-05-25: qty 1

## 2021-05-25 MED ORDER — SODIUM CHLORIDE 0.9% FLUSH BAG - 250 ML
INTRAVENOUS | Status: DC | PRN
Start: 2021-05-25 — End: 2021-05-26

## 2021-05-25 MED ORDER — HEPARIN (PORCINE) 5,000 UNIT/ML INJECTION SOLUTION
5000.0000 [IU] | Freq: Three times a day (TID) | INTRAMUSCULAR | Status: DC
Start: 2021-05-25 — End: 2021-05-26
  Administered 2021-05-25 – 2021-05-26 (×2): 5000 [IU] via SUBCUTANEOUS
  Filled 2021-05-25 (×2): qty 1

## 2021-05-25 MED ORDER — ESCITALOPRAM 10 MG TABLET
10.0000 mg | ORAL_TABLET | Freq: Every evening | ORAL | Status: DC
Start: 2021-05-25 — End: 2021-05-26
  Administered 2021-05-25: 10 mg via ORAL
  Filled 2021-05-25: qty 1

## 2021-05-25 MED ORDER — LORATADINE 10 MG TABLET
10.0000 mg | ORAL_TABLET | Freq: Every day | ORAL | Status: DC | PRN
Start: 2021-05-25 — End: 2021-05-26

## 2021-05-25 MED ORDER — LOSARTAN 25 MG TABLET
25.0000 mg | ORAL_TABLET | Freq: Every day | ORAL | Status: DC
Start: 2021-05-26 — End: 2021-05-26
  Administered 2021-05-26: 25 mg via ORAL
  Filled 2021-05-25: qty 1

## 2021-05-25 NOTE — ED Triage Notes (Signed)
Pt presents for HTN, headache, shoulder and neck pain since this AM. Pt states she was sent here from her dialysis treatment, report getting 2 hours of treatment. Denies blurred vision.

## 2021-05-25 NOTE — H&P (Addendum)
[facility]  [place], [place] [address]    General History and Physical    [name], [name]  Date of Admission:  05/25/2021  Date of Birth:  [birth year]    PCP: Ocie Doyne, NP  Chief Complaint:  Headache, shoulder pain, neck pain      HPI: [name] is a 55 y.o., White female with a past medical history of hypertension, end-stage renal disease, type 2 diabetes who presented the ED today due to headache, neck pain and shoulder pain bilaterally that started this morning.  Patient went for dialysis but due to severely elevated blood pressure in her not feeling well only 2 our session was done patient was sent to the ER.  According the patient she is very active at baseline and denies any chest pain or shortness of breath.  Today she denies any chest pressure, no numbness or tingling sensation in arms, denies any previous history of coronary artery disease.  She did take her medications this morning.    On arrival to the ED patient is hypertensive, tachycardic with initial troponin at 1645. EKG with no acute ST T wave changes.  Patient will be admitted the hospital for further management and evaluation.    Assessment/Plan:     Elevated troponin  No chest pain but patient does have a severe headache with bilateral neck and shoulder pain.  Considering severely elevated blood pressure will get CT head.  Trend troponin x3.  EKG with no acute ST T wave changes.  If troponins trend up please start the patient on heparin drip and patient to be made NPO.  Will order echocardiogram.    Hypertensive urgency  Hypertension  Severely elevated blood pressure can also cause elevations in troponin.  Continue with her home dose of labetalol and Cozaar.  Will start on p.r.n. hydralazine.    Type 2 diabetes  Diabetic diet, insulin sliding scale with a.c. HS.  Continue with p.o. hypoglycemic    End-stage renal disease on hemodialysis.    Her dialysis days Monday Wednesday Friday.  Received only half a  session today.  Will place nephrology consultation.  No acute need for dialysis.    DVT prophylaxis.  Heparin subQ  Code status. Full code.    Active Hospital Problems   (*Primary Problem)    Diagnosis   . Elevated troponin I level       Past Medical History:   Diagnosis Date   . Anxiety    . Arthritis     hips   . Beta blocker prescribed for left ventricular systolic dysfunction     Labetalol    . 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 Cataract     S/P cataract extraction bilaterally    . Cellulitis 10/24/14    left foot   . Cellulitis 10/21/2020    LLE extremity 09/2020-resolved per patient    . Chronic pain     sec. to arthritis-hips   . 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 Constipation    . COVID 07/2019   . Depression    . Diabetes mellitus (CMS [place])    . Diabetes mellitus, type 2 (CMS HCC)    . Diarrhea    . Dyspnea on exertion    . ESRD (end stage renal disease) (CMS [place])    . Essential hypertension    .  Exercise intolerance    . Fatty liver    . Fluid overload 08/21/21    Recent hospitalization for fluid overload, HD initiated 10/18/20   . Headache(784.0)    . Hemodialysis patient (CMS [place])     HD Monday-Wednesday-Friday   . 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 History of nephrectomy 03/24/2020    L nephrectomy   . Hx of echocardiogram 10/18/2020    Technically difficult d/t pt. body habitus; Left vent. EF est. 55-60%; mildy calcified aortic vlave leaflets-especially right coroanry cusp; Small mobile density on aortic valve-no aortic valve stenosis or regurg; Concentric remodeling; mild MR; Trace AR; Trileaflet aortic vlave; Nl IVC size wiht <50% insp. collapse;      Marland Kitchen Hyperlipidemia    . Hyperparathyroidism (CMS [place])    . Hyperthyroidism     Old records states hyperthyroidism, patient denies, states has hyperparathyroidism   . Irritable bowel syndrome    . Macular  edema    . Morbid obesity (CMS [place])    . Obesity    . Panic attack    . Peripheral edema     ankles/feet   . Peripheral neuropathy     bilateral feet   . Pneumonia 07/2019; 09/2020    COVID Pneumonia 07/2019; fluid overload/pneumonia with recent hosp. 09/2020   . Shortness of breath     SOB after climbing 8 steps    . Type 2 diabetes mellitus (CMS HCC)    . Wears glasses     reading   . White coat hypertension            Past Surgical History:   Procedure Laterality Date   . AV FISTULA PLACEMENT Left 08/11/2020    LUE brachio cephalic fistula creation   . ECTOPIC PREGNANCY SURGERY  05/24/1998   . HX CATARACT REMOVAL Bilateral 2016   . HX CHOLECYSTECTOMY  09/10/96   . HX HAND SURGERY Right 2017    Trigger finger release 3rd finger   . HX OTHER Right 10/18/2020    tunneled dialysis catheter   . HX PELVIC LAPAROSCOPY  05/24/98    Ectopic pregnancy   . HX SHOULDER SURGERY Left 2017    Procedure for frozen shoulder   . HX TUBAL LIGATION  09/10/2012    [facility] Clips   . HYMENECTOMY  1996   . KIDNEY SURGERY  10/24/17; 03/24/20   . LAPAROSCOPIC NEPHRECTOMY, HAND ASSISTED Left 03/24/2020    Radical   . LAPAROSCOPIC PARTIAL NEPHRECTOMY Left 10/24/2017    hand-assisted   . RENAL BIOPSY, PERCUTANEOUS  03/08/2020    clear cell renal cell carcinoma           Medications Prior to Admission     Prescriptions    albuterol sulfate (PROVENTIL OR VENTOLIN OR PROAIR) 90 mcg/actuation Inhalation HFA Aerosol Inhaler    Take 2 Puffs by inhalation Every 4 hours as needed    ALPRAZolam (XANAX) 0.25 mg Oral Tablet    Take 0.25 mg by mouth Once per day as needed for Anxiety    atorvastatin (LIPITOR) 20 mg Oral Tablet    Take 20 mg by mouth Every evening    cyclobenzaprine (FLEXERIL) 5 mg Oral Tablet    Take 5 mg by mouth Three times a day as needed for Muscle spasms    escitalopram oxalate (LEXAPRO) 10 mg Oral Tablet    Take 10 mg by mouth Every night    Fish Oil-Omega-3 Fatty Acids  360-1,200 mg Oral Capsule    Take 2 Caps by mouth Twice daily     fluticasone (FLONASE) 50 mcg/actuation Nasal Spray, Suspension    Administer 1 Spray into each nostril Once per day as needed (Allergy symptoms (nasal congestion/ runny nose))    JANUVIA 50 mg Oral Tablet    Take 50 mg by mouth Every morning    labetaloL (NORMODYNE) 100 mg Oral Tablet    Take 0.5 Tablets (50 mg total) by mouth Twice daily for 14 days Indications: Hold for SBP<120 or HR<55    Patient taking differently:  Take by mouth Take 0.5 (half) tablet twice daily on non-dialysis days (Sun/Tues/Thurs/Sat); Take once daily in the evening on dialysis days (Mon/Wed/Fri) Indications: Hold for SBP<120 or HR<55    loperamide (IMODIUM) 2 mg Oral Capsule    Take 4 mg by mouth Every 6 hours as needed (Diarrhea)    loratadine (CLARITIN) 10 mg Oral Tablet    Take 10 mg by mouth Once per day as needed (Allergies)    losartan (COZAAR) 25 mg Oral Tablet    Take 1 Tablet (25 mg total) by mouth Once a day for 30 days    Patient taking differently:  Take 25 mg by mouth Take in the evening on non-dialysis days (Sun/Tues/Thurs/Sat); does not take on dialysis days    traMADoL (ULTRAM) 50 mg Oral Tablet    Take 1 Tablet (50 mg total) by mouth Every 6 hours as needed for Pain    vitamin E 100 unit Oral Capsule    Take 100 Units by mouth Twice daily        nitroGLYCERIN (NITROSTAT) sublingual tablet, 0.4 mg, Sublingual, Q5 Min PRN        Allergies   Allergen Reactions   . Vicodin [Hydrocodone-Acetaminophen] Itching       Social History     Tobacco Use   . Smoking status: Never Smoker   . Smokeless tobacco: Never Used   Substance Use Topics   . Alcohol use: No     Family Medical History:     Problem Relation (Age of Onset)    Breast Cancer Paternal Aunt    Cancer Maternal Grandmother    Diabetes Father, Paternal Aunt    High Cholesterol Maternal Uncle, Mother    Hypertension (High Blood Pressure) Maternal Uncle, Father        ROS: Other than ROS in the HPI, all other systems were negative.    DNR Status:  Prior    EXAM:  Temperature:  37.4 C (99.3 F)  Heart Rate: (!) 124  BP (Non-Invasive): (!) 180/103  Respiratory Rate: 18  SpO2: 97 %  General:  Looks older than her age, chronically ill, acutely ill in moderate distress.  Eyes: Pupils equal and round, reactive to light and accomodation.   HEENT: Head atraumatic and normocephalic   Neck: No JVD or thyromegaly or lymphadenopathy   Lungs: Clear to auscultation bilaterally.   Cardiovascular: regular rate and rhythm, S1, S2 normal, no murmur  Abdomen: Soft, non-tender, Bowel sounds normal, No hepatosplenomegaly   Extremities: extremities normal, atraumatic, no cyanosis or edema   Skin: Skin warm and dry   Neurologic: Grossly normal   Lymphatics: No lymphadenopathy   Psychiatric:  Pleasant affect        Labs:    Lab Results for Last 24 Hours:    Results for orders placed or performed during the hospital encounter of 05/25/21 (from the past 24 hour(s))   COMPREHENSIVE  METABOLIC PANEL, NON-FASTING   Result Value Ref Range    SODIUM 138 136 - 145 mmol/L    POTASSIUM 4.3 3.5 - 5.1 mmol/L    CHLORIDE 95 (L) 96 - 111 mmol/L    CO2 TOTAL 30 22 - 30 mmol/L    ANION GAP 13 4 - 13 mmol/L    BUN 21 8 - 25 mg/dL    CREATININE 4.26 (H) 0.60 - 1.05 mg/dL    BUN/CREA RATIO 5 (L) 6 - 22    ESTIMATED GFR 12 (L) >=60 mL/min/BSA    ALBUMIN 3.5 3.5 - 5.0 g/dL     CALCIUM 9.9 8.5 - 10.0 mg/dL    GLUCOSE 175 (H) 65 - 125 mg/dL    ALKALINE PHOSPHATASE 80 50 - 130 U/L    ALT (SGPT) 34 (H) 8 - 22 U/L    AST (SGOT)  28 8 - 45 U/L    BILIRUBIN TOTAL 0.8 0.3 - 1.3 mg/dL    PROTEIN TOTAL 7.0 6.4 - 8.3 g/dL   TROPONIN-I   Result Value Ref Range    TROPONIN I 1,645 (HH) 7 - 30 ng/L   CBC WITH DIFF   Result Value Ref Range    WBC 10.6 3.7 - 11.0 x10^3/uL    RBC 3.06 (L) 3.85 - 5.22 x10^6/uL    HGB 10.9 (L) 11.5 - 16.0 g/dL    HCT 32.9 (L) 34.8 - 46.0 %    MCV 107.5 (H) 78.0 - 100.0 fL    MCH 35.6 (H) 26.0 - 32.0 pg    MCHC 33.1 31.0 - 35.5 g/dL    RDW-CV 17.9 (H) 11.5 - 15.5 %    PLATELETS 158 150 - 400 x10^3/uL    MPV 10.6 8.7 -  12.5 fL    NEUTROPHIL % 90 %    LYMPHOCYTE % 6 %    MONOCYTE % 3 %    EOSINOPHIL % 0 %    BASOPHIL % 0 %    NEUTROPHIL # 9.46 (H) 1.50 - 7.70 x10^3/uL    LYMPHOCYTE # 0.66 (L) 1.00 - 4.80 x10^3/uL    MONOCYTE # 0.36 0.20 - 1.10 x10^3/uL    EOSINOPHIL # <0.10 <=0.50 x10^3/uL    BASOPHIL # <0.10 <=0.20 x10^3/uL    IMMATURE GRANULOCYTE % 1 0 - 1 %    IMMATURE GRANULOCYTE # <0.10 <0.10 x10^3/uL   COVID-19, FLU A/B, RSV RAPID BY PCR   Result Value Ref Range    SARS-CoV-2 Not Detected Not Detected    INFLUENZA VIRUS TYPE A Not Detected Not Detected    INFLUENZA VIRUS TYPE B Not Detected Not Detected    RESPIRATORY SYNCTIAL VIRUS (RSV) Not Detected Not Detected       Imaging Studies:  Images from this admission reviewed by me, interpreted by radiologist.    DVT RISK FACTORS HAVE BEN ASSESSED AND PROPHYLAXIS ORDERED (SEE RUBYONLINE - REFERENCE TOOLS - MD, DVT PROPHY OR POCKET CARD)        Garwin Brothers, MD   Portions of this note may be dictated using voice recognition software or a dictation service. Variances in spelling and vocabulary are possible and unintentional. Not all errors are caught/corrected. Please notify the [facility] if any discrepancies are noted or if the meaning of any statement is not clear.

## 2021-05-25 NOTE — ED Nurses Note (Signed)
Dr. Reesa Chew in to see patient.

## 2021-05-25 NOTE — ED Nurses Note (Addendum)
Dr. Algis Downs notified of BP 207/90; just administered routine medications labetalol 100 mg po and NTG 0.4 mg patch. Will continue to monitor and administer prn hydralazine if needed.

## 2021-05-25 NOTE — ED Nurses Note (Signed)
Patient ambulated to bathroom with daughter. Gait steady, no assistance needed. Will continue to monitor.

## 2021-05-25 NOTE — ED Nurses Note (Signed)
Lab notified, troponin due at 2300.

## 2021-05-25 NOTE — ED Provider Notes (Signed)
[facility]  Emergency Department Visit Note  Fredonia Highland, M.D.      Chief Complaint:  Hypertension    HISTORY OF PRESENT ILLNESS     [name], date of birth [birth year], is a 55 y.o.female who presents to the Emergency Department with hypertension. The patient reports that she woke up this morning with headache, shoulder and neck pain. The patient reports she was at Dialysis treatment today when she started to feel shaky and ended up not completing her entire session. The patient reports that she normally is in dialysis for four hours but only got close to seventy five minutes today. The patient states that she did have full sessions on Monday and Friday and did not have any of the currently symptoms she is having today. The patient takes Labetalol for her hypertension. The patient denies blurred vision, nausea, vomiting, chest pain, shortness of breath, headache, cough, and fever.    Of note, pertinent medical history includes type two diabetes, end stage renal disease, and hypertension.    REVIEW OF SYSTEMS     The pertinent positive and negative symptoms are as per HPI. All other systems reviewed and are negative.     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    Constipation     COVID 07/2019    Depression     Diabetes mellitus (CMS HCC)     Diabetes mellitus, type 2 (CMS HCC)     Diarrhea     Dyspnea on exertion     ESRD (end stage  renal disease) (CMS HCC)     Essential hypertension     Exercise intolerance     Fatty liver     Fluid overload 08/21/21    Recent hospitalization for fluid overload, HD initiated 10/18/20    Headache(784.0)     Hemodialysis patient (CMS [place])     HD Monday-Wednesday-Friday    History of kidney disease     CKD-Stage 5-HD initiated 10/18/20    History of nephrectomy 03/24/2020    L nephrectomy    Hx of echocardiogram 10/18/2020    Technically difficult d/t pt. body habitus; Left vent. EF est. 55-60%; mildy calcified aortic vlave leaflets-especially right coroanry cusp; Small mobile density on aortic valve-no aortic valve stenosis or regurg; Concentric remodeling; mild MR; Trace AR; Trileaflet aortic vlave; Nl IVC size wiht <50% insp. collapse;       Hyperlipidemia     Hyperparathyroidism (CMS [place])     Hyperthyroidism     Old records states hyperthyroidism, patient denies, states has hyperparathyroidism    Irritable bowel syndrome     Macular edema     Morbid obesity (CMS HCC)     Obesity     Panic attack  Peripheral edema     ankles/feet    Peripheral neuropathy     bilateral feet    Pneumonia 07/2019; 09/2020    COVID Pneumonia 07/2019; fluid overload/pneumonia with recent hosp. 09/2020    Shortness of breath     SOB after climbing 8 steps     Type 2 diabetes mellitus (CMS [place])     Wears glasses     reading    White coat hypertension      Past Surgical History:  Past Surgical History:   Procedure Laterality Date    Av fistula placement Left 08/11/2020    Ectopic pregnancy surgery  05/24/1998    Hx cataract removal Bilateral 2016    Hx cholecystectomy  09/10/96    Hx hand surgery Right 2017    Hx other Right 10/18/2020    Hx pelvic laparoscopy  05/24/98    Hx shoulder surgery Left 2017    Hx tubal ligation  09/10/2012    Hymenectomy  1996    Kidney surgery  10/24/17; 03/24/20    Laparoscopic nephrectomy, hand assisted Left 03/24/2020    Laparoscopic partial nephrectomy Left 10/24/2017     Renal biopsy, percutaneous  03/08/2020     Family History:  Family Medical History:     Problem Relation (Age of Onset)    Breast Cancer Paternal Aunt    Cancer Maternal Grandmother    Diabetes Father, Paternal Aunt    High Cholesterol Maternal Uncle, Mother    Hypertension (High Blood Pressure) Maternal Uncle, Father        Social History:  Social History     Tobacco Use    Smoking status: Never Smoker    Smokeless tobacco: Never Used   Brewing technologist Use: Never used   Substance Use Topics    Alcohol use: No    Drug use: No     Medications:  Current Outpatient Medications   Medication Sig    albuterol sulfate (PROVENTIL OR VENTOLIN OR PROAIR) 90 mcg/actuation Inhalation HFA Aerosol Inhaler Take 2 Puffs by inhalation Every 4 hours as needed    ALPRAZolam (XANAX) 0.25 mg Oral Tablet Take 0.25 mg by mouth Once per day as needed for Anxiety    atorvastatin (LIPITOR) 20 mg Oral Tablet Take 20 mg by mouth Every evening    cyclobenzaprine (FLEXERIL) 5 mg Oral Tablet Take 5 mg by mouth Three times a day as needed for Muscle spasms    escitalopram oxalate (LEXAPRO) 10 mg Oral Tablet Take 10 mg by mouth Every night    Fish Oil-Omega-3 Fatty Acids 360-1,200 mg Oral Capsule Take 2 Caps by mouth Twice daily    fluticasone (FLONASE) 50 mcg/actuation Nasal Spray, Suspension Administer 1 Spray into each nostril Once per day as needed (Allergy symptoms (nasal congestion/ runny nose))    JANUVIA 50 mg Oral Tablet Take 50 mg by mouth Every morning    labetaloL (NORMODYNE) 100 mg Oral Tablet Take 0.5 Tablets (50 mg total) by mouth Twice daily for 14 days Indications: Hold for SBP<120 or HR<55 (Patient taking differently: Take by mouth Take 0.5 (half) tablet twice daily on non-dialysis days (Sun/Tues/Thurs/Sat); Take once daily in the evening on dialysis days (Mon/Wed/Fri) Indications: Hold for SBP<120 or HR<55)    loperamide (IMODIUM) 2 mg Oral Capsule Take 4 mg by mouth Every 6 hours as needed (Diarrhea)     loratadine (CLARITIN) 10 mg Oral Tablet Take 10 mg by mouth Once per day as needed (Allergies)  losartan (COZAAR) 25 mg Oral Tablet Take 1 Tablet (25 mg total) by mouth Once a day for 30 days (Patient taking differently: Take 25 mg by mouth Take in the evening on non-dialysis days (Sun/Tues/Thurs/Sat); does not take on dialysis days)    traMADoL (ULTRAM) 50 mg Oral Tablet Take 1 Tablet (50 mg total) by mouth Every 6 hours as needed for Pain    vitamin E 100 unit Oral Capsule Take 100 Units by mouth Twice daily     Allergies:  Allergies   Allergen Reactions    Vicodin [Hydrocodone-Acetaminophen] Itching     PHYSICAL EXAM     Vitals:  ED Triage Vitals [05/25/21 1616]   BP (Non-Invasive) (!) 159/100   Heart Rate (!) 124   Respiratory Rate 18   Temperature 37.4 C (99.3 F)   SpO2 97 %   Weight 122 kg (270 lb)   Height 1.702 m (5\' 7" )     Physical Exam  Vitals and nursing note reviewed.   Constitutional:       General: She is not in acute distress.     Appearance: She is obese. She is not ill-appearing.      Comments: Poor baseline health for age.  Not acutely ill-appearing.  Morbidly obese.   HENT:      Head: Normocephalic and atraumatic.   Eyes:      Conjunctiva/sclera: Conjunctivae normal.      Pupils: Pupils are equal, round, and reactive to light.   Cardiovascular:      Rate and Rhythm: Regular rhythm. Tachycardia present.      Pulses: Normal pulses.      Heart sounds: Normal heart sounds.   Pulmonary:      Effort: Pulmonary effort is normal. No respiratory distress.      Breath sounds: Normal breath sounds.   Abdominal:      General: There is no distension.      Palpations: Abdomen is soft.      Tenderness: There is no abdominal tenderness. There is no guarding.   Musculoskeletal:         General: No deformity or signs of injury.      Cervical back: No rigidity.   Skin:     General: Skin is warm and dry.   Neurological:      Mental Status: She is alert and oriented to person, place, and time.   Psychiatric:          Behavior: Behavior normal.       DIAGNOSTIC STUDIES     Labs:    Labs Reviewed   COMPREHENSIVE METABOLIC PANEL, NON-FASTING - Abnormal; Notable for the following components:       Result Value    CHLORIDE 95 (*)     CREATININE 4.26 (*)     BUN/CREA RATIO 5 (*)     ESTIMATED GFR 12 (*)     GLUCOSE 175 (*)     ALT (SGPT) 34 (*)     All other components within normal limits   TROPONIN-I - Abnormal; Notable for the following components:    TROPONIN I 1,645 (*)     All other components within normal limits   CBC WITH DIFF - Abnormal; Notable for the following components:    RBC 3.06 (*)     HGB 10.9 (*)     HCT 32.9 (*)     MCV 107.5 (*)     MCH 35.6 (*)     RDW-CV 17.9 (*)  NEUTROPHIL # 9.46 (*)     LYMPHOCYTE # 0.66 (*)     All other components within normal limits   CBC/DIFF    Narrative:     The following orders were created for panel order CBC/DIFF.  Procedure                               Abnormality         Status                     ---------                               -----------         ------                     CBC WITH VOZD[[id number]]                Abnormal            Final result               MORPHOLOGY[[id number]]                                       Final result                 Please view results for these tests on the individual orders.   MORPHOLOGY    Narrative:     18327Slight macrocytosis   RAINBOW DRAW - [facility] ONLY    Narrative:     The following orders were created for panel order RAINBOW DRAW - [facility] ONLY.  Procedure                               Abnormality         Status                     ---------                               -----------         ------                     Erskine Emery QVZD[[id number]]                                    In process                 GOLD TOP IRJJ[[id number]]                                    In process                 RED TOP KZSW[[id number]]                                     In process  LIGHT GREEN TOP QQPY[[id number]]                              In process                   Please view results for these tests on the individual orders.   BLUE TOP TUBE   GOLD TOP TUBE   RED TOP TUBE   LIGHT GREEN TOP TUBE   COVID-19, FLU A/B, RSV RAPID BY PCR     Labs reviewed and interpreted by me.    Radiology:    XR AP MOBILE CHEST   Final Result by Edi, Radresults In (08/31 1801)   No acute pulmonary disease.         Radiologist location ID: [id number]           Radiological imaging interpreted by radiologist. Report reviewed by me.    EKG:  12 lead EKG interpreted by me shows sinus rhythm, rate of 121 bpm, normal axis, no acute concerning interval abnormalities, no acute concerning ST segment depression.    ED PROGRESS NOTE / [place]     ED Course as of 05/26/21 1645   Wed May 25, 2021   1740 TROPONIN-I(!!): 3,382  Aspirin ordered and nitro for this and elevated BP. Headaches meds also given    1818 XR AP MOBILE CHEST  NAP      55 year old female with ESRD on dialysis presents the emergency department with neck and bilateral shoulder and upper back pain.  She is mildly hypertensive appears uncomfortable but is nontoxic, not severely ill and in no acute distress.  Physical exam without concerning focal abnormality.  Incomplete dialysis session today due to her symptoms.  EKG shows nonspecific ST segment morphology without definitive ischemic change however the patient's troponin was markedly elevated.  Aspirin and nitroglycerin administered.  Headache medications given the patient also reports headache.  Her symptoms were not sudden in onset not maximal intensity at onset and are bilateral.  My suspicion for vascular dissection is low, especially in the setting of NSTEMI.      Old records reviewed by me:  I have reviewed the nurse's notes. I have reviewed the patient's problem list and pertinent past medical records.    Orders Placed This Encounter    XR AP MOBILE CHEST    COMPREHENSIVE METABOLIC PANEL, NON-FASTING    TROPONIN-I    CBC  WITH DIFF    COVID-19, FLU A/B, RSV RAPID BY PCR    MORPHOLOGY    ECG 12-LEAD (Take to provider with a brief history)    aspirin chewable tablet 324 mg    nitroGLYCERIN (NITROSTAT) sublingual tablet    prochlorperazine (COMPAZINE) 5 mg/mL injection    diphenhydrAMINE (BENADRYL) 50 mg/mL injection    acetaminophen (TYLENOL) tablet     1745: Paging hospitalist.    5053: I discussed the patient's case and above findings with Dr. Reesa Chew [facility]) who is agreeable to the treatment plan and is arranging for admission at this time.     MIPS     Not applicable     OPIATE PRESCRIPTION      Not applicable    CORE MEASURES     Not applicable    CRITICAL CARE TIME     Not applicable     PRE-DISPOSITION VITALS      Pre-Disposition Vitals:  Filed Vitals:    05/25/21 1616 05/25/21 1801  BP: (!) 159/100 (!) 180/103   Pulse: (!) 124    Resp: 18    Temp: 37.4 C (99.3 F)    SpO2: 97%      CLINICAL IMPRESSION     Encounter Diagnosis   Name Primary?    NSTEMI (non-ST elevated myocardial infarction) (CMS HCC) Yes     DISPOSITION/PLAN     Admitted.    Condition at Disposition: Fair    I, Kelby Aline, SCRIBE, scribed for Fredonia Highland, M.D. on 05/25/2021 at 5:07 PM     I personally performed the services described in this documentation, as scribed  in my presence, and it is both accurate  and complete.  Fredonia Highland, MD 05/25/2021, 17:07   [facility]

## 2021-05-25 NOTE — ED Nurses Note (Signed)
Ice chips provided per patient request. Blood pressure has improved 178/86. Daughter at bedside. No acute signs of distress noted. Will continue to monitor.

## 2021-05-26 LAB — POC FINGERSTICK GLUCOSE - BMC/JMC (RESULTS)
GLUCOSE, POC: 203 mg/dl — ABNORMAL HIGH (ref 60–100)
GLUCOSE, POC: 246 mg/dl — ABNORMAL HIGH (ref 60–100)

## 2021-05-26 LAB — COMPREHENSIVE METABOLIC PANEL, NON-FASTING
ALBUMIN: 3 g/dL — ABNORMAL LOW (ref 3.5–5.0)
ALKALINE PHOSPHATASE: 69 U/L (ref 50–130)
ALT (SGPT): 30 U/L — ABNORMAL HIGH (ref 8–22)
ANION GAP: 14 mmol/L — ABNORMAL HIGH (ref 4–13)
AST (SGOT): 29 U/L (ref 8–45)
BILIRUBIN TOTAL: 0.6 mg/dL (ref 0.3–1.3)
BUN/CREA RATIO: 5 — ABNORMAL LOW (ref 6–22)
BUN: 28 mg/dL — ABNORMAL HIGH (ref 8–25)
CALCIUM: 9.3 mg/dL (ref 8.5–10.0)
CHLORIDE: 97 mmol/L (ref 96–111)
CO2 TOTAL: 25 mmol/L (ref 22–30)
CREATININE: 5.54 mg/dL — ABNORMAL HIGH (ref 0.60–1.05)
ESTIMATED GFR: 9 mL/min/BSA — ABNORMAL LOW (ref >=60)
GLUCOSE: 208 mg/dL — ABNORMAL HIGH (ref 65–125)
POTASSIUM: 4.8 mmol/L (ref 3.5–5.1)
PROTEIN TOTAL: 6.1 g/dL — ABNORMAL LOW (ref 6.4–8.3)
SODIUM: 136 mmol/L (ref 136–145)

## 2021-05-26 LAB — MAGNESIUM: MAGNESIUM: 1.9 mg/dL (ref 1.8–2.6)

## 2021-05-26 LAB — TROPONIN-I: TROPONIN I: 1584 ng/L (ref 7–30)

## 2021-05-26 LAB — PHOSPHORUS: PHOSPHORUS: 3 mg/dL (ref 2.4–4.7)

## 2021-05-26 MED ORDER — LABETALOL 200 MG TABLET
200.0000 mg | ORAL_TABLET | Freq: Three times a day (TID) | ORAL | 0 refills | Status: DC
Start: 2021-05-26 — End: 2023-05-08

## 2021-05-26 MED ORDER — LABETALOL 100 MG TABLET
100.0000 mg | ORAL_TABLET | Freq: Three times a day (TID) | ORAL | Status: DC
Start: 2021-05-26 — End: 2021-05-26

## 2021-05-26 MED ORDER — LABETALOL 100 MG TABLET
200.0000 mg | ORAL_TABLET | Freq: Three times a day (TID) | ORAL | Status: DC
Start: 2021-05-26 — End: 2021-05-26

## 2021-05-26 NOTE — ED Nurses Note (Signed)
Patient resting with eyes closed, cardiac monitor in place showing SR 90's. No signs of distress noted at this time. Will continue to monitor.

## 2021-05-26 NOTE — ED Nurses Note (Signed)
Patient resting quietly, no signs of distress noted at this time. Will continue to monitor.

## 2021-05-26 NOTE — Progress Notes (Signed)
[facility]   IP PROGRESS NOTE      [name], [name]  Date of Admission:  05/25/2021  Date of Birth:  [birth year]  Date of Service:  05/26/2021    Chief Complaint: Headache, shoulder pain, neck pain  Subjective: This AM, patient appears comfortable lying in bed with a preference for lights off due to headache. Patient had bout of bowel incontinence this morning, which is not unusual for her. No acute changes overnight per patient and chart review. Patient endorses persistent headache, bilateral posterior neck and shoulder pain with severity of 9/10, decreased from yesterday. Denies chest pain, shortness of breath, numbness/tingling of extremities, nausea/vomiting, bowel/bladder changes. Patient was seen by nephrology who told her that she will pick up with scheduled dialysis tomorrow (9/2) despite not completing dialysis yesterday. Dialysis site in left UE intact without signs of infection, irritation. Patient endorses depression and increased anxiety, attributing that to recent at-home stressor of husband's extramarital affair. She notes that presenting symptoms (headache, neck/shoulder pain) are similar to other hypertensive episodes, but says this time is more severe. Denies changes in medications, notes that she has taken them as prescribed. Patient vocalized understanding of today's plan.      ROS:  10 or more systems reviewed.  Negative except as mentioned above    Assessment/ Plan:     Elevated troponin   Suspect secondary to hypertensive urgency   Severe headache, bilateral neck and shoulder pain; no chest pain   EKG showed no acute ST-T wave changes   CT brain showed no signs of bleed, infarct, other acute pathology   Troponin trended x 3 - 1645, 1469, 1584    Hypertensive urgency  Hypertension   BP 137/91 this AM before daily meds   Continue home PO dose of cozaar   Continue IV hydralazine PRN for uncontrolled HTN   Increase home dose of labetalol from 100 mg to 200 mg   Follow with serial BP  readings     T2DM   BG 203 this AM   Continue linagliptin   Diabetic diet, insulin sliding scale with ac hs; dextrose if BG <60   Continue following with serial POC testing    ESRD on hemodialysis   Cr 5.5 this AM, increased from 4.2 yesterday   No clinical indication for acute dialysis   Nephrologist recommends continuing M/W/F dialysis schedule    Nephrologist recommends renal diet and binders for secondary hyperPTH     Anxiety/Depression   Continue home dose of Lexapro   Counseling options discussed with care management this AM      Problem List:  Active Hospital Problems   (*Primary Problem)    Diagnosis   . Elevated troponin I level   . CKD (chronic kidney disease), stage V (CMS HCC)     Added automatically from request for surgery [id number]     . Essential hypertension     Chronic     Not at goal. Losartan increased already. Will add Amlodipine 5. Goal <120/80     . Type 2 diabetes mellitus (CMS HCC)     Chronic     Control much improved - A1C 5.2% in January. Continue current regimen         DVT PPX: Heparin SC  Code Status: Full code    Disposition: Anticipate home discharge upon improvement of clinical status    Objective     Vital Signs:  Temp (24hrs) Max:37.4 C (99.3 F)      Temperature: 36.9  C (98.4 F)  BP (Non-Invasive): (!) 164/68  MAP (Non-Invasive): 93 mmHG  Heart Rate: 89  Respiratory Rate: (!) 25  SpO2: 94 %    Current Medications:  atorvastatin (LIPITOR) tablet, 20 mg, Oral, QPM  SSIP insulin lispro (HUMALOG) 100 units/mL SubQ pen, 1-12 Units, Subcutaneous, 4x/day AC   And  dextrose 50% (0.5 g/mL) injection - syringe, 12.5 g, Intravenous, Q15 Min PRN  escitalopram (LEXAPRO) tablet, 10 mg, Oral, NIGHTLY  heparin 5,000 unit/mL injection, 5,000 Units, Subcutaneous, Q8HRS  hydrALAZINE (APRESOLINE) injection 10 mg, 10 mg, Intravenous, Q8H PRN  labetalol (NORMODYNE) tablet, 200 mg, Oral, Q8HRS  linagliptin (TRADJENTA) tablet, 5 mg, Oral, Daily  loperamide (IMODIUM) capsule, 4 mg, Oral, Q6H  PRN  loratadine (CLARITIN) tablet, 10 mg, Oral, Daily PRN  losartan (COZAAR) tablet, 25 mg, Oral, Daily  NS 250 mL flush bag, , Intravenous, Q1H PRN  NS flush syringe, 10 mL, Intravenous, Q8HRS  NS flush syringe, 10 mL, Intravenous, Q1H PRN  ondansetron (ZOFRAN) 2 mg/mL injection, 4 mg, Intravenous, Q8H PRN  traMADol (ULTRAM) tablet, 50 mg, Oral, Q8H PRN      Today's Physical Exam:  General: Not in acute distress; alert and oriented x 3  Eyes: PERRLA, no icterus  HENT: Head atraumatic, normocephalic, head/shoulder/neck pain non-reproducible to palpation  Neck: No JVD or thyromegaly or lymphadenopathy, no carotid bruit   Lungs: CTAB in 8 fields, non-labored breathing, no rales or wheezing.    Cardiovascular: regular rate and rhythm, S1/S2 normal, no murmur appreciated, radial pulse +2/4 bilaterally  Abdomen: Soft, non-tender, normoactive bowel sounds, no hepatosplenomegaly   Extremities: Extremities normal, no edema or cyanosis, UE and LE strength intact bilaterally   Skin: Skin warm and dry, no rash detected, dialysis site intact without signs of infection  Neurologic: CN II-VIII, XI intact, no tremors, no numbness/tingling, no focal deficits noted, sciatic pain bilateral similar to baseline, smile intact, no slurred speech  Psychiatric: Pleasant but slightly flat affect, normal behavior, engaging in conversation    I/O:  I/O last 24 hours:      Intake/Output Summary (Last 24 hours) at 05/26/2021 1318  Last data filed at 05/26/2021 1000  Gross per 24 hour   Intake 10 ml   Output --   Net 10 ml     I/O current shift:  09/01 0700 - 09/01 1859  In: 10 [I.V.:10]  Out: -       Labs  Please indicate ordered or reviewed)  Reviewed:   Lab Results for Last 24 Hours:    Results for orders placed or performed during the hospital encounter of 05/25/21 (from the past 24 hour(s))   COMPREHENSIVE METABOLIC PANEL, NON-FASTING   Result Value Ref Range    SODIUM 138 136 - 145 mmol/L    POTASSIUM 4.3 3.5 - 5.1 mmol/L    CHLORIDE 95 (L) 96  - 111 mmol/L    CO2 TOTAL 30 22 - 30 mmol/L    ANION GAP 13 4 - 13 mmol/L    BUN 21 8 - 25 mg/dL    CREATININE 4.26 (H) 0.60 - 1.05 mg/dL    BUN/CREA RATIO 5 (L) 6 - 22    ESTIMATED GFR 12 (L) >=60 mL/min/BSA    ALBUMIN 3.5 3.5 - 5.0 g/dL     CALCIUM 9.9 8.5 - 10.0 mg/dL    GLUCOSE 175 (H) 65 - 125 mg/dL    ALKALINE PHOSPHATASE 80 50 - 130 U/L    ALT (SGPT) 34 (H) 8 - 22  U/L    AST (SGOT)  28 8 - 45 U/L    BILIRUBIN TOTAL 0.8 0.3 - 1.3 mg/dL    PROTEIN TOTAL 7.0 6.4 - 8.3 g/dL   TROPONIN-I   Result Value Ref Range    TROPONIN I 1,645 (HH) 7 - 30 ng/L   CBC WITH DIFF   Result Value Ref Range    WBC 10.6 3.7 - 11.0 x10^3/uL    RBC 3.06 (L) 3.85 - 5.22 x10^6/uL    HGB 10.9 (L) 11.5 - 16.0 g/dL    HCT 32.9 (L) 34.8 - 46.0 %    MCV 107.5 (H) 78.0 - 100.0 fL    MCH 35.6 (H) 26.0 - 32.0 pg    MCHC 33.1 31.0 - 35.5 g/dL    RDW-CV 17.9 (H) 11.5 - 15.5 %    PLATELETS 158 150 - 400 x10^3/uL    MPV 10.6 8.7 - 12.5 fL    NEUTROPHIL % 90 %    LYMPHOCYTE % 6 %    MONOCYTE % 3 %    EOSINOPHIL % 0 %    BASOPHIL % 0 %    NEUTROPHIL # 9.46 (H) 1.50 - 7.70 x10^3/uL    LYMPHOCYTE # 0.66 (L) 1.00 - 4.80 x10^3/uL    MONOCYTE # 0.36 0.20 - 1.10 x10^3/uL    EOSINOPHIL # <0.10 <=0.50 x10^3/uL    BASOPHIL # <0.10 <=0.20 x10^3/uL    IMMATURE GRANULOCYTE % 1 0 - 1 %    IMMATURE GRANULOCYTE # <0.10 <0.10 x10^3/uL   BLUE TOP TUBE   Result Value Ref Range    RAINBOW/EXTRA TUBE AUTO RESULT Yes    GOLD TOP TUBE   Result Value Ref Range    RAINBOW/EXTRA TUBE AUTO RESULT Yes    RED TOP TUBE   Result Value Ref Range    RAINBOW/EXTRA TUBE AUTO RESULT Yes    LIGHT GREEN TOP TUBE   Result Value Ref Range    RAINBOW/EXTRA TUBE AUTO RESULT Yes    COVID-19, FLU A/B, RSV RAPID BY PCR   Result Value Ref Range    SARS-CoV-2 Not Detected Not Detected    INFLUENZA VIRUS TYPE A Not Detected Not Detected    INFLUENZA VIRUS TYPE B Not Detected Not Detected    RESPIRATORY SYNCTIAL VIRUS (RSV) Not Detected Not Detected   TROPONIN-I   Result Value Ref Range    TROPONIN  I 1,469 (HH) 7 - 30 ng/L   POC FINGERSTICK GLUCOSE - [facility] (RESULTS)   Result Value Ref Range    GLUCOSE, POC 145 (H) 60 - 100 mg/dl   TROPONIN-I   Result Value Ref Range    TROPONIN I 1,584 (HH) 7 - 30 ng/L   COMPREHENSIVE METABOLIC PANEL, NON-FASTING   Result Value Ref Range    SODIUM 136 136 - 145 mmol/L    POTASSIUM 4.8 3.5 - 5.1 mmol/L    CHLORIDE 97 96 - 111 mmol/L    CO2 TOTAL 25 22 - 30 mmol/L    ANION GAP 14 (H) 4 - 13 mmol/L    BUN 28 (H) 8 - 25 mg/dL    CREATININE 5.54 (H) 0.60 - 1.05 mg/dL    BUN/CREA RATIO 5 (L) 6 - 22    ESTIMATED GFR 9 (L) >=60 mL/min/BSA    ALBUMIN 3.0 (L) 3.5 - 5.0 g/dL     CALCIUM 9.3 8.5 - 10.0 mg/dL    GLUCOSE 208 (H) 65 - 125 mg/dL    ALKALINE PHOSPHATASE 69  50 - 130 U/L    ALT (SGPT) 30 (H) 8 - 22 U/L    AST (SGOT)  29 8 - 45 U/L    BILIRUBIN TOTAL 0.6 0.3 - 1.3 mg/dL    PROTEIN TOTAL 6.1 (L) 6.4 - 8.3 g/dL   MAGNESIUM   Result Value Ref Range    MAGNESIUM 1.9 1.8 - 2.6 mg/dL   PHOSPHORUS   Result Value Ref Range    PHOSPHORUS 3.0 2.4 - 4.7 mg/dL   POC FINGERSTICK GLUCOSE - [facility] (RESULTS)   Result Value Ref Range    GLUCOSE, POC 203 (H) 60 - 100 mg/dl   POC FINGERSTICK GLUCOSE - [facility] (RESULTS)   Result Value Ref Range    GLUCOSE, POC 246 (H) 60 - 100 mg/dl       Radiology Tests   05/25/21   PROCEDURE DESCRIPTION: XR AP MOBILE CHEST   FINDINGS:The lungs appear clear. There is no evidence of pneumonia, edema or effusion.  Cardiomediastinal silhouette is normal.   IMPRESSION: No acute pulmonary disease.   Radiologist location ID: [id number]       CT BRAIN WO IV CONTRAST    FINDINGS: There is no acute intracranial hemorrhage or extra-axial fluid collection. There is no  mass,  midline shift or herniation. No evidence of hydrocephalus. The gray-white differentiation is  maintained.   IMPRESSION: No acute intracranial abnormality.   Radiologist location ID: [id number]      Neill Loft, MED STUDENT  05/26/2021, 09:59    Above note was written by medical student only for  educational purposes.  It should not be used to alter management of the above patient.  Please review my note from the same day for patient care matters.    Garwin Brothers, MD.

## 2021-05-26 NOTE — ED Nurses Note (Signed)
Report given to Harlie Buening, RN for continued care

## 2021-05-26 NOTE — ED Nurses Note (Signed)
Patient resting with eyes closed, no signs of distress noted. Will continue to monitor.

## 2021-05-26 NOTE — Discharge Summary (Signed)
[facility]  [place], [place] [address]    DISCHARGE SUMMARY      PATIENT NAME:  [name], [name]  MRN:  [id number]  DOB:  05/17/1966    ADMISSION DATE:  05/25/2021  DISCHARGE DATE:  05/26/2021    ATTENDING PHYSICIAN: Garwin Brothers, MD  PRIMARY CARE PHYSICIAN: Ocie Doyne, NP     ADMISSION DIAGNOSIS: <principal problem not specified>  DISCHARGE DIAGNOSIS:   Active Hospital Problems    Diagnosis Date Noted   . Elevated troponin I level [R77.8] 05/25/2021   . CKD (chronic kidney disease), stage V (CMS HCC) [N18.5] 08/03/2020   . Essential hypertension [I10] 11/30/2014   . Type 2 diabetes mellitus (CMS [place]) [E11.9] 10/25/2014      Resolved Hospital Problems   No resolved problems to display.     Active Non-Hospital Problems    Diagnosis Date Noted   . History of kidney disease    . Cancer (CMS [place])    . Fluid overload 10/16/2020   . Persistent vomiting 08/20/2019   . Morbid obesity 10/24/2017   . 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 Hyperkalemia 10/08/2017   . Slow transit constipation 10/08/2017   . C. difficile diarrhea 12/02/2014   . Obesity 11/30/2014   . Cellulitis of foot, left 11/29/2014   . Diabetic foot infection (CMS [place]) 10/24/2014      DISCHARGE MEDICATIONS:     Current Discharge Medication List      CONTINUE these medications which have CHANGED during your visit.      Details   labetaloL 200 mg Tablet  Commonly known as: NORMODYNE  What changed:    medication strength   how much to take   when to take this   200 mg, Oral, EVERY 8 HOURS (SCHEDULED)  Qty: 90 Tablet  Refills: 0     losartan 25 mg Tablet  Commonly known as: COZAAR  What changed:    when to take this   additional instructions   25 mg, Oral, DAILY  Qty: 30 Tablet  Refills: 0        CONTINUE these medications - NO CHANGES were made during your visit.      Details   albuterol sulfate 90 mcg/actuation HFA Aerosol Inhaler  Commonly known as: PROVENTIL or VENTOLIN or PROAIR   2 Puffs, Inhalation,  EVERY 4 HOURS PRN  Qty: 2 Inhaler  Refills: 0     ALPRAZolam 0.25 mg Tablet  Commonly known as: XANAX   0.25 mg, Oral, DAILY PRN  Refills: 0     atorvastatin 20 mg Tablet  Commonly known as: LIPITOR   20 mg, Oral, EVERY EVENING  Refills: 0     cyclobenzaprine 5 mg Tablet  Commonly known as: FLEXERIL   5 mg, Oral, 3 TIMES DAILY PRN  Refills: 0     escitalopram oxalate 10 mg Tablet  Commonly known as: LEXAPRO   10 mg, Oral, NIGHTLY  Refills: 0     Fish Oil-Omega-3 Fatty Acids 360-1,200 mg Capsule   2 Capsules, Oral, 2 TIMES DAILY  Refills: 0     fluticasone propionate 50 mcg/actuation Spray, Suspension  Commonly known as: FLONASE   1 Spray, Each Nostril, DAILY PRN  Refills: 0     Januvia 50 mg Tablet  Generic drug: SITagliptin   50 mg, Oral, EVERY MORNING  Refills: 0     loperamide 2 mg Capsule  Commonly known as: IMODIUM   4 mg,  Oral, EVERY 6 HOURS PRN  Refills: 0     loratadine 10 mg Tablet  Commonly known as: CLARITIN   10 mg, Oral, DAILY PRN  Refills: 0     traMADoL 50 mg Tablet  Commonly known as: ULTRAM   50 mg, Oral, EVERY 6 HOURS PRN  Qty: 30 Tablet  Refills: 0     vitamin E 100 unit Capsule   100 Units, Oral, 2 TIMES DAILY  Refills: 0          DISCHARGE INSTRUCTIONS:      RETURN TO WORK/SCHOOL     Patient May Return to Work: 05/30/2021    Activity Limitations: none    Patient Was Seen in Clinic/ED on: 05/25/2021    Patient Was in the Care of a Physician on the Following Dates: 05/25/21-05/26/21      [place] COURSE:  This is a 55 y.o., female with a past medical history of anxiety/depression, end-stage renal disease on hemodialysis, type 2 diabetes, hypertension who was admitted the hospital after patient was noted to have severely elevated blood pressure as well as headache during dialysis.  Session was discontinued patient was sent to the ED.  EKG was unchanged from her baseline, troponin was mildly elevated with stayed around the same.  Patient did have systolic blood pressure in  200s.  That improved after treatment during hospitalization.  On my evaluation this morning she is feeling significantly improved and will be discharged home.  I have increased her to beta-blocker, will continue same dose of losartan upon discharge.  She will continue her dialysis from tomorrow.    35 minute spent on discharge planning including coordination of care and counseling.    Objective      Physical exam :  GENERAL: The patient is alert and oriented to time, place and person.   HEENT: Normocephalic, anicteric. No pallor. PERRL.   NECK: Supple. No jugular venous distention.   LUNGS: clear breath sounds are audible bilaterally. Good bilateral air entry.  HEART: Both first and second sounds are audible, regular sinus rhythm. No murmur.  ABDOMEN: Soft, bowel sounds are present, nontender, no hepatosplenomegaly.   EXTREMITIES: No edema. Peripheral pulses are present.   CENTRAL NERVOUS SYSTEM: No focal deficit, no cranial nerve palsy.   SKIN: No rashes or bruises.   MUSCULOSKELETAL SYSTEM: No deformity or swelling.   PSYCHIATRIC: No anxiety or depression.   HEMATOLOGIC: No ecchymosis, petechia or hematoma.      COURSE IN HOSPITAL: As above.  Please see admitting physician/Midlevels H&P    CT BRAIN WO IV CONTRAST    Result Date: 05/25/2021  CT BRAIN WO IV CONTRAST performed on 05/25/2021 7:20 PM INDICATION: 55 years old Female; Headache TECHNIQUE: 5 mm Axial images of the brain from the vertex to the skull base with reformatted 5 mm coronal and sagittal images reconstructed with soft tissue and bone algorithms. COMPARISON: None available FINDINGS: There is no acute intracranial hemorrhage or extra-axial fluid collection. There is no mass, midline shift or herniation. No evidence of hydrocephalus. The gray-white differentiation is maintained. Bilateral paranasal sinuses are well aerated. There is no mastoid effusion. The scalp and skull are normal.     No acute intracranial abnormality. Radiologist location ID:  [id number]     XR AP MOBILE CHEST    Result Date: 05/25/2021  PROCEDURE DESCRIPTION: XR AP MOBILE CHEST CLINICAL INDICATION: Chest pain FINDINGS: The lungs appear clear. There is no evidence of pneumonia, edema or effusion. Cardiomediastinal  silhouette is normal.     No acute pulmonary disease. Radiologist location ID: [id number]                CONDITION ON DISCHARGE: Alert    DISCHARGE DISPOSITION:  Home discharge     - PCP appointment was scheduled/requested within 7 days post discharge ? Yes     - Communication to PCP was done at the time of discharge? No    - Discharge summary completed at the time of discharge/prior to Follow-up appointment With PCP? Yes     cc: Primary Care Physician:  Ocie Doyne, NP  [place] [address]     KS:[id number] Physician:  No referring provider defined for this encounter.   Garwin Brothers, MD

## 2021-05-26 NOTE — ED Nurses Note (Signed)
Patient up sitting in chair at bedside. No c/o voiced at this time. Will continue to monitor.

## 2021-05-26 NOTE — ED Nurses Note (Signed)
Patient medicated for headache with tramadol, rates pain 10/10. See MAR for details. Patient transferred via stretcher to ER Room 07. Report to A. Lynita Lombard.

## 2021-05-26 NOTE — Progress Notes (Signed)
[place]  Initial Consult Note      Service: [facility]  Requesting MD: Judeth Horn  Date of Service:  05/26/2021  Encounter Start Date: 05/25/2021  Inpatient Admission Date:      Information Obtained from: patient  Chief Complaint:  Headache and hypertensive urgency    HPI/Discussion:  Yesterday morning had headache, neck pain that did not resolved on HD, elevated BP, and was taken off HD early and sent to ED. Hypertensive urgency and elevated Troponin. BP better controlled and minimal improvement in HA. No dyspnea,. Today is son's birthday.     Past Medical History:   Diagnosis Date   . Anxiety    . Arthritis     hips   . Beta blocker prescribed for left ventricular systolic dysfunction     Labetalol    . Cancer (CMS [place]) 2019, 2021    kidney, Clear Cell Cancer left kidney-S/P partial nephrectomy 2019, then radical nephrectomy 2021   . Cataract     S/P cataract extraction bilaterally    . Cellulitis 10/24/14    left foot   . Cellulitis 10/21/2020    LLE extremity 09/2020-resolved per patient    . Chronic pain     sec. to arthritis-hips   . 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 Constipation    . COVID 07/2019   . Depression    . Diabetes mellitus (CMS [place])    . Diabetes mellitus, type 2 (CMS HCC)    . Diarrhea    . Dyspnea on exertion    . ESRD (end stage renal disease) (CMS [place])    . Essential hypertension    . Exercise intolerance    . Fatty liver    . Fluid overload 08/21/21    Recent hospitalization for fluid overload, HD initiated 10/18/20   . Headache(784.0)    . Hemodialysis patient (CMS [place])     HD Monday-Wednesday-Friday   . 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 History of nephrectomy 03/24/2020    L nephrectomy   . Hx of echocardiogram 10/18/2020    Technically difficult d/t pt. body habitus; Left vent. EF est. 55-60%; mildy calcified aortic vlave leaflets-especially right coroanry cusp; Small mobile density on aortic valve-no aortic  valve stenosis or regurg; Concentric remodeling; mild MR; Trace AR; Trileaflet aortic vlave; Nl IVC size wiht <50% insp. collapse;      Marland Kitchen Hyperlipidemia    . Hyperparathyroidism (CMS [place])    . Hyperthyroidism     Old records states hyperthyroidism, patient denies, states has hyperparathyroidism   . Irritable bowel syndrome    . Macular edema    . Morbid obesity (CMS [place])    . Obesity    . Panic attack    . Peripheral edema     ankles/feet   . Peripheral neuropathy     bilateral feet   . Pneumonia 07/2019; 09/2020    COVID Pneumonia 07/2019; fluid overload/pneumonia with recent hosp. 09/2020   . Shortness of breath     SOB after climbing 8 steps    . Type 2 diabetes mellitus (CMS HCC)    . Wears glasses     reading   . White coat hypertension        Past Surgical History:   Procedure Laterality Date   . AV FISTULA PLACEMENT Left 08/11/2020    LUE brachio cephalic fistula  creation   . ECTOPIC PREGNANCY SURGERY  05/24/1998   . HX CATARACT REMOVAL Bilateral 2016   . HX CHOLECYSTECTOMY  09/10/96   . HX HAND SURGERY Right 2017    Trigger finger release 3rd finger   . HX OTHER Right 10/18/2020    tunneled dialysis catheter   . HX PELVIC LAPAROSCOPY  05/24/98    Ectopic pregnancy   . HX SHOULDER SURGERY Left 2017    Procedure for frozen shoulder   . HX TUBAL LIGATION  09/10/2012    Fil[facility]ie Clips   . HYMENECTOMY  1996   . KIDNEY SURGERY  10/24/17; 03/24/20   . LAPAROSCOPIC NEPHRECTOMY, HAND ASSISTED Left 03/24/2020    Radical   . LAPAROSCOPIC PARTIAL NEPHRECTOMY Left 10/24/2017    hand-assisted   . RENAL BIOPSY, PERCUTANEOUS  03/08/2020    clear cell renal cell carcinoma           Inpatient Medications:  atorvastatin (LIPITOR) tablet, 20 mg, Oral, QPM  SSIP insulin lispro (HUMALOG) 100 units/mL SubQ pen, 1-12 Units, Subcutaneous, 4x/day AC   And  dextrose 50% (0.5 g/mL) injection - syringe, 12.5 g, Intravenous, Q15 Min PRN  escitalopram (LEXAPRO) tablet, 10 mg, Oral, NIGHTLY  heparin 5,000 unit/mL injection, 5,000 Units,  Subcutaneous, Q8HRS  hydrALAZINE (APRESOLINE) injection 10 mg, 10 mg, Intravenous, Q8H PRN  labetalol (NORMODYNE) tablet, 200 mg, Oral, Q8HRS  linagliptin (TRADJENTA) tablet, 5 mg, Oral, Daily  loperamide (IMODIUM) capsule, 4 mg, Oral, Q6H PRN  loratadine (CLARITIN) tablet, 10 mg, Oral, Daily PRN  losartan (COZAAR) tablet, 25 mg, Oral, Daily  NS 250 mL flush bag, , Intravenous, Q1H PRN  NS flush syringe, 10 mL, Intravenous, Q8HRS  NS flush syringe, 10 mL, Intravenous, Q1H PRN  ondansetron (ZOFRAN) 2 mg/mL injection, 4 mg, Intravenous, Q8H PRN  traMADol (ULTRAM) tablet, 50 mg, Oral, Q8H PRN      Allergies   Allergen Reactions   . Vicodin [Hydrocodone-Acetaminophen] Itching       Diet Order:  DIET DIABETIC/ADA 2 gm Potassium, Low sodium (2 gm Na), Low phosphorus  Nutrition:    Orders Placed This Encounter   No orders of the following type(s) were placed in this encounter: Nourishments.     IV Fluids, Meds, and Drips:      Family History  Family Medical History:     Problem Relation (Age of Onset)    Breast Cancer Paternal Aunt    Cancer Maternal Grandmother    Diabetes Father, Paternal Aunt    High Cholesterol Maternal Uncle, Mother    Hypertension (High Blood Pressure) Maternal Uncle, Father          Social History  Social History     Socioeconomic History   . Marital status: Married     Spouse name: Not on file   . Number of children: Not on file   . Years of education: Not on file   . Highest education level: Not on file   Occupational History   . Not on file   Tobacco Use   . Smoking status: Never Smoker   . Smokeless tobacco: Never Used   Vaping Use   . Vaping Use: Never used   Substance and Sexual Activity   . Alcohol use: No   . Drug use: No   . Sexual activity: Yes     Partners: Male     Birth control/protection: Surgical   Other Topics Concern   . Ability to Walk 1 Flight of Steps without SOB/CP  Not Asked   . Routine Exercise Not Asked   . Ability to Walk 2 Flight of Steps without SOB/CP Not Asked   . Unable  to Ambulate Not Asked   . Total Care Not Asked   . Ability To Do Own ADL's Yes   . Uses Walker Not Asked   . Other Activity Level Yes     Comment: SOB after climbing approx. 8 steps, sedentary since recent hosp. for fluid overload and CKD   . Uses Cane Not Asked   Social History Narrative   . Not on file     Social Determinants of Health     Financial Resource Strain: Not on file   Food Insecurity: Not on file   Transportation Needs: Not on file   Physical Activity: Not on file   Stress: Not on file   Intimate Partner Violence: Not on file   Housing Stability: Not on file       ROS: Other than ROS in the HPI, all other systems were negative.    Exam:  Temperature: 36.9 C (98.4 F)  Heart Rate: 90  BP (Non-Invasive): (!) 137/91  Respiratory Rate: (!) 22  SpO2: 98 %  Constitutional:  moderately obese and no distress  Respiratory:  Clear to auscultation bilaterally.   Cardiovascular:  no rub  Gastrointestinal:  Soft, non-tender  Musculoskeletal:  Head atraumatic and normocephalic  Integumentary:  Skin warm and dry  Neurologic:  No tremor  LUA AVF with good thrill and bruit.     I/O:  I/O last 24 hours:  No intake or output data in the 24 hours ending 05/26/21 1047  I/O current shift:  No intake/output data recorded.    Labs:  I have reviewed all lab results.    Imaging Studies:  N/A    Impression/Recommendations:   1. 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 Next HD tomorrow.   2. HTN - Increased Labatalol.   3. Secondary hyperparathyroidism - renal diet and binders.      Marijo Sanes, MD

## 2021-05-26 NOTE — Nurses Notes (Signed)
Patient discharged home with family.  AVS reviewed with patient/care giver.  A written copy of the AVS and discharge instructions was given to the patient/care giver.  Questions sufficiently answered as needed.  Patient/care giver encouraged to follow up with PCP as indicated.  In the event of an emergency, patient/care giver instructed to call 911 or go to the nearest emergency room.

## 2021-05-26 NOTE — Consults (Signed)
Pt denied all substance use. Stated her husband, of 40 years, has been having an affair again. Pt recently left her husband and has been depressed. Pt denied SI and HI. Reported decreased interest in daily activities. Increased in depressive symptoms and lack of energy. Discussed counseling options. Pt reported she has a strong network of support through her friends, family, and Doristine Bosworth. BNI: Pt agreed to counseling. Provided SBIRT brochure and [place] resources. No further interventions.

## 2021-05-26 NOTE — Care Management Notes (Signed)
No Prior Auth Is Required  For test (Ct Brain )if patient is under Observation   per PEIA [phone]

## 2021-05-26 NOTE — Nurses Notes (Signed)
All outpatient appointments made prior to discharge and patient educated about change in medications and when to take and where to pick up. Patient's daughter at bedside. Ambulating for discharge.

## 2021-05-27 LAB — ECG 12-LEAD
Atrial Rate: 121 {beats}/min
Calculated P Axis: 43 degrees
Calculated R Axis: -4 degrees
Calculated T Axis: 28 degrees
PR Interval: 130 ms
QRS Duration: 76 ms
QT Interval: 336 ms
QTC Calculation: 477 ms
Ventricular rate: 121 {beats}/min

## 2021-05-28 ENCOUNTER — Emergency Department
Admission: EM | Admit: 2021-05-28 | Discharge: 2021-05-28 | Disposition: A | Discharge status: Other Acute Inpatient Hospital | Payer: 59 | Attending: Surgery | Admitting: Surgery

## 2021-05-28 ENCOUNTER — Other Ambulatory Visit: Payer: Self-pay

## 2021-05-28 ENCOUNTER — Emergency Department (HOSPITAL_COMMUNITY): Payer: 59

## 2021-05-28 ENCOUNTER — Emergency Department
Admission: EM | Admit: 2021-05-28 | Discharge: 2021-05-28 | Disposition: A | Discharge status: Home / Self Care | Payer: 59 | Attending: Physician Assistant | Admitting: Physician Assistant

## 2021-05-28 DIAGNOSIS — M79662 Pain in left lower leg: Secondary | ICD-10-CM | POA: Insufficient documentation

## 2021-05-28 DIAGNOSIS — M7989 Other specified soft tissue disorders: Secondary | ICD-10-CM | POA: Insufficient documentation

## 2021-05-28 DIAGNOSIS — Z992 Dependence on renal dialysis: Secondary | ICD-10-CM | POA: Insufficient documentation

## 2021-05-28 DIAGNOSIS — L03116 Cellulitis of left lower limb: Secondary | ICD-10-CM | POA: Insufficient documentation

## 2021-05-28 DIAGNOSIS — N186 End stage renal disease: Secondary | ICD-10-CM | POA: Insufficient documentation

## 2021-05-28 DIAGNOSIS — R609 Edema, unspecified: Secondary | ICD-10-CM

## 2021-05-28 DIAGNOSIS — I120 Hypertensive chronic kidney disease with stage 5 chronic kidney disease or end stage renal disease: Secondary | ICD-10-CM | POA: Insufficient documentation

## 2021-05-28 LAB — CBC WITH DIFF
BASOPHIL #: <0.1 10*3/uL (ref <=0.20)
BASOPHIL %: 0 %
EOSINOPHIL #: <0.1 10*3/uL (ref <=0.50)
EOSINOPHIL %: 1 %
HCT: 28.8 % — ABNORMAL LOW (ref 34.8–46.0)
HGB: 9.5 g/dL — ABNORMAL LOW (ref 11.5–16.0)
IMMATURE GRANULOCYTE #: <0.1 10*3/uL (ref <0.10)
IMMATURE GRANULOCYTE %: 0 % (ref 0–1)
LYMPHOCYTE #: 1.34 10*3/uL (ref 1.00–4.80)
LYMPHOCYTE %: 25 %
MCH: 35.6 pg — ABNORMAL HIGH (ref 26.0–32.0)
MCHC: 33 g/dL (ref 31.0–35.5)
MCV: 107.9 fL — ABNORMAL HIGH (ref 78.0–100.0)
MONOCYTE #: 0.46 10*3/uL (ref 0.20–1.10)
MONOCYTE %: 8 %
MPV: 10.6 fL (ref 8.7–12.5)
NEUTROPHIL #: 3.59 10*3/uL (ref 1.50–7.70)
NEUTROPHIL %: 66 %
PLATELETS: 231 10*3/uL (ref 150–400)
RBC: 2.67 10*6/uL — ABNORMAL LOW (ref 3.85–5.22)
RDW-CV: 18.3 % — ABNORMAL HIGH (ref 11.5–15.5)
WBC: 5.5 10*3/uL (ref 3.7–11.0)

## 2021-05-28 LAB — COMPREHENSIVE METABOLIC PANEL, NON-FASTING
ALBUMIN: 3.2 g/dL — ABNORMAL LOW (ref 3.5–5.0)
ALKALINE PHOSPHATASE: 76 U/L (ref 50–130)
ALT (SGPT): 29 U/L — ABNORMAL HIGH (ref 8–22)
ANION GAP: 13 mmol/L (ref 4–13)
AST (SGOT): 25 U/L (ref 8–45)
BILIRUBIN TOTAL: 0.6 mg/dL (ref 0.3–1.3)
BUN/CREA RATIO: 5 — ABNORMAL LOW (ref 6–22)
BUN: 23 mg/dL (ref 8–25)
CALCIUM: 9.4 mg/dL (ref 8.5–10.0)
CHLORIDE: 96 mmol/L (ref 96–111)
CO2 TOTAL: 32 mmol/L — ABNORMAL HIGH (ref 22–30)
CREATININE: 5.07 mg/dL — ABNORMAL HIGH (ref 0.60–1.05)
ESTIMATED GFR: 10 mL/min/BSA — ABNORMAL LOW (ref >=60)
GLUCOSE: 158 mg/dL — ABNORMAL HIGH (ref 65–125)
POTASSIUM: 3.7 mmol/L (ref 3.5–5.1)
PROTEIN TOTAL: 6.7 g/dL (ref 6.4–8.3)
SODIUM: 141 mmol/L (ref 136–145)

## 2021-05-28 LAB — LACTIC ACID LEVEL W/ REFLEX FOR LEVEL >2.0: LACTIC ACID: 0.7 mmol/L (ref 0.5–2.2)

## 2021-05-28 MED ORDER — VANCOMYCIN 1 GRAM/200 ML IN DEXTROSE 5 % INTRAVENOUS PIGGYBACK - 60 MIN INFUSION
15.0000 mg/kg | INJECTION | INTRAVENOUS | Status: AC
Start: 2021-05-28 — End: 2021-05-28
  Administered 2021-05-28: 0 mg via INTRAVENOUS
  Administered 2021-05-28: 1000 mg via INTRAVENOUS
  Filled 2021-05-28: qty 200

## 2021-05-28 NOTE — ED Provider Notes (Signed)
EMERGENCY DEPARTMENT HISTORY AND PHYSICAL EXAM      Date Time: 05/28/21 12:18 PM  Patient Name: Kelli Carlson  Attending Physician: Ramon Dredge, DO      Assessment/Plan:   IMPRESSION/DIFFERENTAL DX:   Final diagnoses:   Pain and swelling of left lower leg     PLAN:   ED Disposition       ED Disposition   [facility]H Transfer to [place]   --    Date/Time   Sat May 28, 2021 12:18 PM    Comment   I spoke with Dr Vernon Prey, ER Attending at [facility], and he graciously accepted the pt in transfer.  ~11:59am (The pt chose [facility] as that's where she gets her dialysis and was just in the ER for ~ 30 hours earlier this week.)    T he pt will be transferred via private vehicle, per the pt's request, in fair and stable condition.    Impression/MDM: Left calf pain and swelling, hx of dialysis. He of immobilization earlier this week.                 History of Presenting Illness:   Kelli Carlson IS Carlson 55 y.o. female who presents with Carlson hx of developing Carlson swollen, painful left leg.  She was at dialysis this past Wednesday when afterwards they found that her BP was markedly elevated.  They ended up keeping her in the ER for ~ 2days to adjust her BP meds.  She had dialysis on Friday and then noticed that she has Carlson swollen, painful, slightly red (? Blotchy type rash) left lower leg. She states the pain started in her left groin and went down her left leg. Now the pain is mostly at the left lower calf area. ? Superficial thrombophlebitis palpated.  She then has pain when she touches the leg.    Past Medical History:     Past Medical History:   Diagnosis Date    Anxiety     Arthritis     Cellulitis     left foot    Depression     Diabetes mellitus     Fatty liver     Irritable bowel syndrome     Malignant neoplasm     Left Kidney CA    Obesity     White coat hypertension        Past Surgical History:     Past Surgical History:   Procedure Laterality Date    ABDOMINAL SURGERY      Partial  removal of the left kidney    AV FISTULA PLACEMENT      CATARACT EXTRACTION  2016    CHOLECYSTECTOMY      ECTOPIC PREGNANCY SURGERY  05/25/1998    EYE SURGERY      GALLBLADDER SURGERY  09/11/1996    KIDNEY SURGERY  10/24/2017    kidney cancer    NEPHRECTOMY Left 03/24/2020    Complete    PELVIC LAPAROSCOPY      TRIGGER FINGER RELEASE  03/31/2016    TUBAL LIGATION      TUBAL LIGATION  09/10/2012       Family History:     Family History   Problem Relation Age of Onset    Hyperlipidemia Mother     Hypertension Father     Diabetes Father     Breast cancer Maternal Grandmother     Alzheimer's disease Paternal Grandmother  Leukemia Paternal Grandfather     Breast cancer Paternal Aunt        Social History:     Social History     Socioeconomic History    Marital status: Married     Spouse name: Not on file    Number of children: Not on file    Years of education: Not on file    Highest education level: Not on file   Occupational History    Not on file   Tobacco Use    Smoking status: Never    Smokeless tobacco: Never   Vaping Use    Vaping Use: Never used   Substance and Sexual Activity    Alcohol use: No     Alcohol/week: 0.0 standard drinks    Drug use: No    Sexual activity: Yes   Other Topics Concern    Not on file   Social History Narrative    Not on file     Social Determinants of Health     Financial Resource Strain: Not on file   Food Insecurity: Not on file   Transportation Needs: Not on file   Physical Activity: Not on file   Stress: Not on file   Social Connections: Not on file   Intimate Partner Violence: Not on file   Housing Stability: Not on file       Allergies:     Allergies   Allergen Reactions    Gabapentin     Hydrocodone-Acetaminophen Itching and Other (See Comments)       Medications:     Previous Medications    ALBUTEROL SULFATE HFA (PROVENTIL HFA) 108 (90 BASE) MCG/ACT INHALER    Inhale 2 puffs into the lungs every 6 (six) hours as needed for Wheezing    ALPRAZOLAM (XANAX) 0.25 MG TABLET    Take  1 tablet (0.25 mg total) by mouth 2 (two) times daily as needed for Anxiety    ATORVASTATIN (LIPITOR) 40 MG TABLET        CALCITRIOL (ROCALTROL) 0.25 MCG CAPSULE    Take 0.25 mcg by mouth    CYCLOBENZAPRINE (FLEXERIL) 5 MG TABLET    Take 1 tablet (5 mg total) by mouth 3 (three) times daily as needed for Muscle spasms    ESCITALOPRAM (LEXAPRO) 10 MG TABLET    Take 1 tablet (10 mg total) by mouth daily    FLUTICASONE (FLONASE) 50 MCG/ACT NASAL SPRAY    1 spray by Nasal route    JANUVIA 50 MG TABLET        LABETALOL HCL PO    Take 200 mg by mouth 2 (two) times daily '100mg'$  in am and 200 mg in pm    LORATADINE (CLARITIN) 10 MG TABLET    Take 10 mg by mouth    LOSARTAN (COZAAR) 50 MG TABLET        OMEGA-3 FATTY ACIDS (FISH OIL) 1000 MG CAP CAPSULE    Take 1 capsule by mouth 2 (two) times daily    SEVELAMER (RENVELA) 800 MG TABLET    Take 1,600 mg by mouth 3 (three) times daily with meals    TRAMADOL (ULTRAM) 50 MG TABLET    Take 1 tablet (50 mg total) by mouth every 8 (eight) hours as needed for Pain    VITAMIN E 100 UNIT CAPSULE    Take 100 Units by mouth        Review of Systems:   Constitutional: No fever or chills.  GI: No  vomiting or diarrhea.  ENT: No ear pain.  Cardiovascular: No chest pain or palpitations.  Respiratory: No cough or shortness of breath.  Skin: ? Rash of the lower leg/foot area. No open skin lesions.  All other systems reviewed and negative except as above, pertinent findings in HPI.    Physical Exam:   Constitutional:  Vitals signs reviewed, well-appearing  Eyes:  Normal to inspection, no discharge  Neuro:  GCS 15, no focal motor deficits  Skin:  Warm, dry  Head:  Atraumatic, normocephalic  Resp/Chest:  Breath sounds normal, no distress  Cardio:  s1s2, rrr, no murmur, no ectopy.   Abdomen:  Soft, no peritioneal signs, non-tender  Upper Extremity:  Inspection normal, no cyanosis  Lower Extremity:  right LE: Inspection normal, no cyanosis  Left LE: + swelling, pain of the lower posterior left lower  leg with ? Palpable engorgement of the veins noted. + bruising vs capillary engorgement of the left foot. + palpable DP and Carlson decreased PT pulse.    Labs / Rads:     Results       ** No results found for the last 24 hours. **          No results found.      Labs and Radiological Studies Reviewed      Ramon Dredge, DO                           [place], [place]  05/28/21 1218

## 2021-05-28 NOTE — ED Triage Notes (Signed)
Pt was seen in ED at [facility] on WED for elevated B/p. Was discharged on Thursday around 1300. Had dialysis on Friday.  Started with pain in left groin and back of left calf with warmth and touch tenderness. Pt has a rash around her left ankle and red blotching on inner aspect of lower left leg.

## 2021-05-28 NOTE — ED Nurses Note (Signed)
.  .  Patient discharged home with family.  AVS reviewed with patient/care giver.  A written copy of the AVS and discharge instructions was given to the patient/care giver.  Questions sufficiently answered as needed.  Patient/care giver encouraged to follow up with PCP as indicated.  In the event of an emergency, patient/care giver instructed to call 911 or go to the nearest emergency room.       .     Current Discharge Medication List      CONTINUE these medications - NO CHANGES were made during your visit.      Details   albuterol sulfate 90 mcg/actuation HFA Aerosol Inhaler  Commonly known as: PROVENTIL or VENTOLIN or PROAIR   2 Puffs, Inhalation, EVERY 4 HOURS PRN  Qty: 2 Inhaler  Refills: 0     ALPRAZolam 0.25 mg Tablet  Commonly known as: XANAX   0.25 mg, Oral, DAILY PRN  Refills: 0     atorvastatin 20 mg Tablet  Commonly known as: LIPITOR   20 mg, Oral, EVERY EVENING  Refills: 0     cyclobenzaprine 5 mg Tablet  Commonly known as: FLEXERIL   5 mg, Oral, 3 TIMES DAILY PRN  Refills: 0     escitalopram oxalate 10 mg Tablet  Commonly known as: LEXAPRO   10 mg, Oral, NIGHTLY  Refills: 0     Fish Oil-Omega-3 Fatty Acids 360-1,200 mg Capsule   2 Capsules, Oral, 2 TIMES DAILY  Refills: 0     fluticasone propionate 50 mcg/actuation Spray, Suspension  Commonly known as: FLONASE   1 Spray, Each Nostril, DAILY PRN  Refills: 0     Januvia 50 mg Tablet  Generic drug: SITagliptin   50 mg, Oral, EVERY MORNING  Refills: 0     labetaloL 200 mg Tablet  Commonly known as: NORMODYNE   200 mg, Oral, EVERY 8 HOURS (SCHEDULED)  Qty: 90 Tablet  Refills: 0     loperamide 2 mg Capsule  Commonly known as: IMODIUM   4 mg, Oral, EVERY 6 HOURS PRN  Refills: 0     loratadine 10 mg Tablet  Commonly known as: CLARITIN   10 mg, Oral, DAILY PRN  Refills: 0     losartan 25 mg Tablet  Commonly known as: COZAAR   25 mg, Oral, DAILY  Qty: 30 Tablet  Refills: 0     traMADoL 50 mg Tablet  Commonly known as: ULTRAM   50 mg, Oral, EVERY 6 HOURS PRN  Qty:  30 Tablet  Refills: 0     vitamin E 100 unit Capsule   100 Units, Oral, 2 TIMES DAILY  Refills: 0

## 2021-05-28 NOTE — Discharge Instructions (Addendum)
Recommend repeat US in 1 week if there is no improvement in redness and swelling with the antibiotics.

## 2021-05-28 NOTE — ED Triage Notes (Signed)
Patient arrives with reports of left leg pain in the calf area, swelling near the ankle and redness in the front of the ankle, patient is unsure if the area is insect bites. Patient was sent from another facility for [place] services not offered at the first facility.

## 2021-05-28 NOTE — ED Provider Notes (Signed)
Thersa Salt, PA-C  [facility]  Emergency Department Visit Note    Date: 05/28/2021  Primary care provider: Ocie Doyne, NP  Means of arrival: private car  History obtained by: patient  History limited by: none      Chief Complaint: Leg Pain      History of Present Illness     [name], date of birth [birth year], is a 55 y.o. female who presents to the Emergency Department complaining of Leg Pain    Patient presents for ultrasound.  Relates that she was seen at [facility] this morning for leg pain, swelling, redness.  Notes that after she left dialysis yesterday she started noticing aching to her left leg.  This morning she woke up and noted some rash to her leg that gotten worse.  She went to [facility] where they recommended ultrasound but did not have that available so she was sent here to get this accomplished.  She denies any fevers, chills, nausea, vomiting.  She does have pain to the left leg.  Denies any previous history of PE or DVT.  Does note that she was admitted for 2 days recently due to elevated blood pressure.  No recent surgeries.    Review of Systems     The pertinent positive and negative symptoms are as per HPI. All other systems reviewed and are negative.    Patient History      Past Medical History:  Past Medical History:   Diagnosis Date   . Anxiety    . Arthritis     hips   . Beta blocker prescribed for left ventricular systolic dysfunction     Labetalol    . 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 Cataract     S/P cataract extraction bilaterally    . Cellulitis 10/24/14    left foot   . Cellulitis 10/21/2020    LLE extremity 09/2020-resolved per patient    . Chronic pain     sec. to arthritis-hips   . 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 Constipation    . COVID 07/2019   . Depression    . Diabetes mellitus (CMS [place])    . Diabetes  mellitus, type 2 (CMS HCC)    . Diarrhea    . Dyspnea on exertion    . ESRD (end stage renal disease) (CMS [place])    . Essential hypertension    . Exercise intolerance    . Fatty liver    . Fluid overload 08/21/21    Recent hospitalization for fluid overload, HD initiated 10/18/20   . Headache(784.0)    . Hemodialysis patient (CMS [place])     HD Monday-Wednesday-Friday   . 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 History of nephrectomy 03/24/2020    L nephrectomy   . Hx of echocardiogram 10/18/2020    Technically difficult d/t pt. body habitus; Left vent. EF est. 55-60%; mildy calcified aortic vlave leaflets-especially right coroanry cusp; Small mobile density on aortic valve-no aortic valve stenosis or regurg; Concentric remodeling; mild MR; Trace AR; Trileaflet aortic vlave; Nl IVC size wiht <50% insp. collapse;      Marland Kitchen Hyperlipidemia    . Hyperparathyroidism (CMS [place])    . Hyperthyroidism     Old records states hyperthyroidism, patient denies, states  has hyperparathyroidism   . Irritable bowel syndrome    . Macular edema    . Morbid obesity (CMS [place])    . Obesity    . Panic attack    . Peripheral edema     ankles/feet   . Peripheral neuropathy     bilateral feet   . Pneumonia 07/2019; 09/2020    COVID Pneumonia 07/2019; fluid overload/pneumonia with recent hosp. 09/2020   . Shortness of breath     SOB after climbing 8 steps    . Type 2 diabetes mellitus (CMS HCC)    . Wears glasses     reading   . White coat hypertension            Past Surgical History:  Past Surgical History:   Procedure Laterality Date   . AV FISTULA PLACEMENT Left 08/11/2020    LUE brachio cephalic fistula creation   . ECTOPIC PREGNANCY SURGERY  05/24/1998   . HX CATARACT REMOVAL Bilateral 2016   . HX CHOLECYSTECTOMY  09/10/96   . HX HAND SURGERY Right 2017    Trigger finger release 3rd finger   . HX OTHER Right 10/18/2020    tunneled dialysis catheter   . HX PELVIC LAPAROSCOPY  05/24/98    Ectopic pregnancy   . HX SHOULDER  SURGERY Left 2017    Procedure for frozen shoulder   . HX TUBAL LIGATION  09/10/2012    Filshie Clips   . HYMENECTOMY  1996   . KIDNEY SURGERY  10/24/17; 03/24/20   . LAPAROSCOPIC NEPHRECTOMY, HAND ASSISTED Left 03/24/2020    Radical   . LAPAROSCOPIC PARTIAL NEPHRECTOMY Left 10/24/2017    hand-assisted   . RENAL BIOPSY, PERCUTANEOUS  03/08/2020    clear cell renal cell carcinoma           Family History:  Family Medical History:     Problem Relation (Age of Onset)    Breast Cancer Paternal Aunt    Cancer Maternal Grandmother    Diabetes Father, Paternal Aunt    High Cholesterol Maternal Uncle, Mother    Hypertension (High Blood Pressure) Maternal Uncle, Father            Social History:  Social History     Tobacco Use   . Smoking status: Never Smoker   . Smokeless tobacco: Never Used   Vaping Use   . Vaping Use: Never used   Substance Use Topics   . Alcohol use: No   . Drug use: No     Social History     Substance and Sexual Activity   Drug Use No       Medications:  Medication List for:  [name]  Date: 05/28/2021     Please check this list CAREFULLY to be sure it's correct!  No current facility-administered medications for this encounter.     Current Outpatient Medications   Medication Sig Dispense Refill   . albuterol sulfate (PROVENTIL OR VENTOLIN OR PROAIR) 90 mcg/actuation Inhalation HFA Aerosol Inhaler Take 2 Puffs by inhalation Every 4 hours as needed 2 Inhaler 0   . ALPRAZolam (XANAX) 0.25 mg Oral Tablet Take 0.25 mg by mouth Once per day as needed for Anxiety     . atorvastatin (LIPITOR) 20 mg Oral Tablet Take 20 mg by mouth Every evening     . cyclobenzaprine (FLEXERIL) 5 mg Oral Tablet Take 5 mg by mouth Three times a day as needed for Muscle spasms     . escitalopram  oxalate (LEXAPRO) 10 mg Oral Tablet Take 10 mg by mouth Every night     . Fish Oil-Omega-3 Fatty Acids 360-1,200 mg Oral Capsule Take 2 Caps by mouth Twice daily     . fluticasone (FLONASE) 50 mcg/actuation Nasal Spray, Suspension  Administer 1 Spray into each nostril Once per day as needed (Allergy symptoms (nasal congestion/ runny nose))     . JANUVIA 50 mg Oral Tablet Take 50 mg by mouth Every morning     . labetaloL (NORMODYNE) 200 mg Oral Tablet Take 1 Tablet (200 mg total) by mouth Every 8 hours for 30 days Indications: Hold for SBP<120 or HR<55 90 Tablet 0   . loperamide (IMODIUM) 2 mg Oral Capsule Take 4 mg by mouth Every 6 hours as needed (Diarrhea)     . loratadine (CLARITIN) 10 mg Oral Tablet Take 10 mg by mouth Once per day as needed (Allergies)     . losartan (COZAAR) 25 mg Oral Tablet Take 1 Tablet (25 mg total) by mouth Once a day for 30 days (Patient taking differently: Take 25 mg by mouth Take in the evening on non-dialysis days (Sun/Tues/Thurs/Sat); does not take on dialysis days) 30 Tablet 0   . traMADoL (ULTRAM) 50 mg Oral Tablet Take 1 Tablet (50 mg total) by mouth Every 6 hours as needed for Pain 30 Tablet 0   . vitamin E 100 unit Oral Capsule Take 100 Units by mouth Twice daily         Allergies:   Allergies   Allergen Reactions   . Vicodin [Hydrocodone-Acetaminophen] Itching       Physical Exam     Vital Signs:  ED Triage Vitals [05/28/21 1435]   BP (Non-Invasive) 124/60   Heart Rate 80   Respiratory Rate 18   Temperature 36.7 C (98.1 F)   SpO2 96 %   Weight 119 kg (263 lb)   Height 1.702 m (5\' 7" )     The initial visit vital signs are reviewed as above.     Pulse Ox: 96% on None (Room Air); interpreted by me [id number]    GENERAL:  This is a well appearing  55 y.o.  female  who is interactive, appropriate and showing no outward signs of distress.    HEAD:  Atraumatic, normocephalic.  HEENT:  Conjunctiva normal in appearance, no gross deformity noted.    NECK:  Supple, no meningeal signs.    HEART:  Regular rate and rhythm without murmurs, rubs, or gallops.  LUNGS:  Clear to ascultation bilaterally.  SKIN:  Warm, good color.  No rashes noted.   NEURO/PSYCH:  Patient is interactive, appropriate and grossly  intact.  VASCULAR:  Posterior tibialis and dorsalis pedis pulses are intact and symmetrical.    EXTREMITIES:  Patient has redness noted to the anterior ankle as well as up the lower leg.  Please see picture below.  Compartments are soft.  She does have some tenderness along the calf line.  Also notes some tenderness to the medial thigh.  No erythema noted here.  No peripheral edema.                     Diagnostics     Labs:    Results for orders placed or performed during the hospital encounter of 05/28/21   COMPREHENSIVE METABOLIC PANEL, NON-FASTING   Result Value Ref Range    SODIUM 141 136 - 145 mmol/L    POTASSIUM 3.7 3.5 - 5.1 mmol/L  CHLORIDE 96 96 - 111 mmol/L    CO2 TOTAL 32 (H) 22 - 30 mmol/L    ANION GAP 13 4 - 13 mmol/L    BUN 23 8 - 25 mg/dL    CREATININE 5.07 (H) 0.60 - 1.05 mg/dL    BUN/CREA RATIO 5 (L) 6 - 22    ESTIMATED GFR 10 (L) >=60 mL/min/BSA    ALBUMIN 3.2 (L) 3.5 - 5.0 g/dL     CALCIUM 9.4 8.5 - 10.0 mg/dL    GLUCOSE 158 (H) 65 - 125 mg/dL    ALKALINE PHOSPHATASE 76 50 - 130 U/L    ALT (SGPT) 29 (H) 8 - 22 U/L    AST (SGOT)  25 8 - 45 U/L    BILIRUBIN TOTAL 0.6 0.3 - 1.3 mg/dL    PROTEIN TOTAL 6.7 6.4 - 8.3 g/dL   LACTIC ACID LEVEL W/ REFLEX FOR LEVEL >2.0   Result Value Ref Range    LACTIC ACID 0.7 0.5 - 2.2 mmol/L   CBC WITH DIFF   Result Value Ref Range    WBC 5.5 3.7 - 11.0 x10^3/uL    RBC 2.67 (L) 3.85 - 5.22 x10^6/uL    HGB 9.5 (L) 11.5 - 16.0 g/dL    HCT 28.8 (L) 34.8 - 46.0 %    MCV 107.9 (H) 78.0 - 100.0 fL    MCH 35.6 (H) 26.0 - 32.0 pg    MCHC 33.0 31.0 - 35.5 g/dL    RDW-CV 18.3 (H) 11.5 - 15.5 %    PLATELETS 231 150 - 400 x10^3/uL    MPV 10.6 8.7 - 12.5 fL    NEUTROPHIL % 66 %    LYMPHOCYTE % 25 %    MONOCYTE % 8 %    EOSINOPHIL % 1 %    BASOPHIL % 0 %    NEUTROPHIL # 3.59 1.50 - 7.70 x10^3/uL    LYMPHOCYTE # 1.34 1.00 - 4.80 x10^3/uL    MONOCYTE # 0.46 0.20 - 1.10 x10^3/uL    EOSINOPHIL # <0.10 <=0.50 x10^3/uL    BASOPHIL # <0.10 <=0.20 x10^3/uL    IMMATURE GRANULOCYTE % 0 0 -  1 %    IMMATURE GRANULOCYTE # <0.10 <0.10 x10^3/uL     Labs reviewed and interpreted by me.    Radiology:   PERIPHERAL VENOUS DUPLEX - LOWER  Interpreted by radiologist and independently reviewed by me.  1.  No deep venous thrombosis within the visualized left leg by ultrasound.        Orders Placed This Encounter   . CBC/DIFF   . COMPREHENSIVE METABOLIC PANEL, NON-FASTING   . LACTIC ACID LEVEL W/ REFLEX FOR LEVEL >2.0   . CBC WITH DIFF   . INSERT & MAINTAIN PERIPHERAL IV ACCESS   . PERIPHERAL VENOUS DUPLEX - LOWER   . vancomycin (VANCOCIN) 1 g in D5W 200 mL premix IVPB         ED Progress Note/Medical Decision Making     Old records reviewed by me:  Complaint Diagnosis Description Type Department Provider    05/25/21 Hypertension NSTEMI (non-ST elevated myocardial infarction) (CMS [facility]) ED (Discharged) ER Garwin Brothers, MD; [place]...   10/16/20 Shortness of Breath Volume overload ... ED to Hosp-Admission (Discharged) (Admit) 74F Creta Levin, MD; [place], [place]n [place]...   03/31/20 Flank Pain Flank pain ... ED (Discharged) ER Gevena Mart, MD   08/20/19 Vomiting DVT (deep venous thrombosis) (CMS HCC) ED to Hosp-Admission (Discharged) (Admit) 5S Justice Rocher, MD; Best, Ronal..Marland Kitchen  08/15/19 Vomiting Nausea and vomiting ... ED (Discharged) ER Natalia Leatherwood, DO   10/08/17 Flank Pain AKI (acute kidney injury) (CMS [place]) ... ED (Discharged) ZER Natalia Leatherwood, DO   06/16/16 Foot Pain Cellulitis of foot, left ED (Discharged) ZER Malachy Chamber, MD   11/29/14 Foot Pain Cellulitis of left foot ... ED to Hosp-Admission (Discharged) (Admit) Z4N Corinne Ports, MD; [place], [place]...   10/24/14 Foot Pain  ED to Hosp-Admission (Discharged) (Admit) Z5S Delanoy, Marcie Bal, MD; [place]ah Blue...   01/22/13 Rib Pain  ED (Discharged) [place] Johna Sheriff, DO       ED Course as of 05/28/21 1744   Sat May 28, 2021   1600 Discussed case with Dr. Collene Mares.  Ultrasound is negative for DVT.  She will be treated for cellulitis.  Vancomycin will  be given here and will contact her nephrologist Dr. Judeth Horn to arrange further vancomycin treatment via dialysis.   1646 Discussed case with on call, nephrologist who will contact the dialysis center to ensure they have Vanc available for patient on Monday.   1739 Reviewed with patient that they will be able to administer the vancomycin at her dialysis treatment center.  Patient has negative lactic today and no signs of systemic infection. Recommended 1 gm IV at her next 5 dialysis sessions.  Ultrasound today does not show DVT.  Recommended repeat ultrasound in 1 week if she is not noting improvement with antibiotics.  Strict precautions to return for any changing or worsening symptoms.  Patient verbalized understanding and had no questions or concerns.     The patient was discussed with Dr Collene Mares who agreed with assessment and plan.      MIPS      Not applicable      OPIATE PRESCRIPTION       Not applicable     CORE MEASURES      Not applicable     CRITICAL CARE TIME      Not applicable      PRE-DISPOSITION VITALS      Pre-Disposition Vitals:  Filed Vitals:    05/28/21 1435   BP: 124/60   Pulse: 80   Resp: 18   Temp: 36.7 C (98.1 F)   SpO2: 96%         CLINICAL IMPRESSION:     Encounter Diagnoses   Name Primary?   . Swelling    . Cellulitis of left lower extremity Yes            Plan/Disposition     Discharged    Prescriptions:  Current Discharge Medication List          Follow Up:  Ocie Doyne, NP  [place]  [place] [address]  [phone]    Call       Will receive Vancomycin at dialysis for the next 5 dialysis sessions.          [facility]  [place]  [phone]    If symptoms worsen      Condition on Disposition: Stable    Supervising physician: Dr Collene Mares.      Portions of this note may be dictated using voice recognition software or a dictation service. Variances in spelling and vocabulary are possible  and unintentional. Not all errors are caught/corrected. Please notify the [facility] if any discrepancies are noted or if the meaning of any statement is not clear.

## 2021-05-30 DIAGNOSIS — L03818 Cellulitis of other sites: Secondary | ICD-10-CM | POA: Insufficient documentation

## 2021-06-01 ENCOUNTER — Ambulatory Visit (RURAL_HEALTH_CENTER): Payer: Self-pay | Admitting: Gerontology

## 2021-06-07 ENCOUNTER — Ambulatory Visit (RURAL_HEALTH_CENTER): Payer: Self-pay | Admitting: Gerontology

## 2021-06-24 ENCOUNTER — Other Ambulatory Visit (RURAL_HEALTH_CENTER): Payer: Self-pay | Admitting: Gerontology

## 2021-06-24 MED ORDER — AMOXICILLIN 500 MG PO CAPS
500.0000 mg | ORAL_CAPSULE | Freq: Three times a day (TID) | ORAL | 0 refills | Status: AC
Start: 2021-06-24 — End: 2021-07-04

## 2021-07-25 ENCOUNTER — Ambulatory Visit: Payer: 59 | Attending: Gerontology | Admitting: Gerontology

## 2021-07-25 ENCOUNTER — Encounter (RURAL_HEALTH_CENTER): Payer: Self-pay | Admitting: Gerontology

## 2021-07-25 VITALS — Temp 97.0°F

## 2021-07-25 DIAGNOSIS — J0100 Acute maxillary sinusitis, unspecified: Secondary | ICD-10-CM

## 2021-07-25 MED ORDER — AMOXICILLIN 500 MG PO CAPS
500.0000 mg | ORAL_CAPSULE | Freq: Three times a day (TID) | ORAL | 0 refills | Status: AC
Start: 2021-07-25 — End: 2021-08-04

## 2021-07-25 NOTE — Progress Notes (Signed)
Subjective:     Patient ID: Kelli Carlson is a 55 y.o. female.  Chief Complaint   Patient presents with    Sinusitis     Sinus pressure, congestion, cough     This visit was conducted with the use of interactive audio/telecommunication that permitted real time communication between [place] and Ocie Doyne, NP . she consented to participation and received services at home, while I was located at [facility]    PHONE    HPI: She has sinus pressure and congestion, pressure in her ears, no fever     The following portions of the patient's history were reviewed and updated as appropriate: allergies, current medications, past family history, past medical history, past social history, past surgical history and problem list.    Social History     Socioeconomic History    Marital status: Married   Tobacco Use    Smoking status: Never    Smokeless tobacco: Never   Vaping Use    Vaping Use: Never used   Substance and Sexual Activity    Alcohol use: No     Alcohol/week: 0.0 standard drinks    Drug use: No    Sexual activity: Yes     Review of Systems                    Objective:    Physical Exam  Temperature 97 F (36.1 C).        Medical Decision Making:   1. Acute maxillary sinusitis, recurrence not specified  - amoxicillin (AMOXIL) 500 MG capsule; Take 1 capsule (500 mg total) by mouth 3 (three) times daily for 10 days  Dispense: 30 capsule; Refill: 0    No follow-ups on file.  Time Spent Documentation for Audio Phone Visits:   5 -10 minutes        Follow up sooner if no improvement or if symptoms worsen or persist.   Medication dosing instructions, drug-drug interactions and potential side effects discussed in detail.   Failure to comply with plan of care discussed with patient. Importance of adhering to follow up office visits discussed in detail.   Potential risks of noncompliance discussed with patient.    Ocie Doyne, NP

## 2021-07-26 ENCOUNTER — Telehealth (RURAL_HEALTH_CENTER): Payer: Self-pay | Admitting: Gerontology

## 2021-07-26 NOTE — Telephone Encounter (Signed)
Note completed and faxed to [phone] per patient ([place]).

## 2021-07-26 NOTE — Telephone Encounter (Signed)
Message routed for provider approval.

## 2021-07-26 NOTE — Telephone Encounter (Signed)
Patient asking for a work note for today also. She was not  feeling well enough to go to work today.

## 2021-07-26 NOTE — Telephone Encounter (Signed)
That's fine

## 2021-08-08 ENCOUNTER — Other Ambulatory Visit (RURAL_HEALTH_CENTER): Payer: Self-pay

## 2021-08-08 DIAGNOSIS — M5441 Lumbago with sciatica, right side: Secondary | ICD-10-CM

## 2021-08-08 DIAGNOSIS — F419 Anxiety disorder, unspecified: Secondary | ICD-10-CM

## 2021-08-08 MED ORDER — ALPRAZOLAM 0.25 MG PO TABS
0.2500 mg | ORAL_TABLET | Freq: Two times a day (BID) | ORAL | 0 refills | Status: AC | PRN
Start: 2021-08-08 — End: ?

## 2021-08-08 MED ORDER — TRAMADOL HCL 50 MG PO TABS
50.0000 mg | ORAL_TABLET | Freq: Three times a day (TID) | ORAL | 2 refills | Status: DC | PRN
Start: 2021-08-08 — End: 2022-04-11

## 2021-08-08 NOTE — Telephone Encounter (Signed)
Patient called asking for Xanax and Tramadol to be sent to [facility]. Requests sent for approval.

## 2021-08-23 ENCOUNTER — Telehealth (RURAL_HEALTH_CENTER): Payer: Self-pay

## 2021-08-23 ENCOUNTER — Other Ambulatory Visit (RURAL_HEALTH_CENTER): Payer: Self-pay | Admitting: Gerontology

## 2021-08-23 MED ORDER — CEPHALEXIN 500 MG PO CAPS
500.0000 mg | ORAL_CAPSULE | Freq: Three times a day (TID) | ORAL | 0 refills | Status: AC
Start: 2021-08-23 — End: 2021-08-30

## 2021-08-23 NOTE — Telephone Encounter (Signed)
Was expecting antibiotic to have been sent in yesterday and [facility] did not receive it.    She would like for it to be sent to [facility] now as she will be home today/    She said she spoke to [place] over the weekend about this?

## 2021-08-23 NOTE — Telephone Encounter (Signed)
Patient advised.

## 2021-08-23 NOTE — Telephone Encounter (Signed)
It was a Chief Executive Officer message, I will send today

## 2021-08-23 NOTE — Telephone Encounter (Signed)
Message routed to provider to review.

## 2021-08-28 ENCOUNTER — Emergency Department
Admission: EM | Admit: 2021-08-28 | Discharge: 2021-08-28 | Disposition: A | Discharge status: Home / Self Care | Payer: 59 | Attending: Emergency Medicine | Admitting: Emergency Medicine

## 2021-08-28 ENCOUNTER — Other Ambulatory Visit: Payer: Self-pay

## 2021-08-28 DIAGNOSIS — A0472 Enterocolitis due to Clostridium difficile, not specified as recurrent: Secondary | ICD-10-CM

## 2021-08-28 DIAGNOSIS — E1136 Type 2 diabetes mellitus with diabetic cataract: Secondary | ICD-10-CM | POA: Insufficient documentation

## 2021-08-28 DIAGNOSIS — E1122 Type 2 diabetes mellitus with diabetic chronic kidney disease: Secondary | ICD-10-CM | POA: Insufficient documentation

## 2021-08-28 DIAGNOSIS — N186 End stage renal disease: Secondary | ICD-10-CM | POA: Insufficient documentation

## 2021-08-28 DIAGNOSIS — Z992 Dependence on renal dialysis: Secondary | ICD-10-CM | POA: Insufficient documentation

## 2021-08-28 DIAGNOSIS — D649 Anemia, unspecified: Secondary | ICD-10-CM | POA: Insufficient documentation

## 2021-08-28 LAB — CBC WITH DIFF
BASOPHIL #: <0.1 10*3/uL (ref <=0.20)
BASOPHIL %: 1 %
EOSINOPHIL #: <0.1 10*3/uL (ref <=0.50)
EOSINOPHIL %: 1 %
HCT: 32.5 % — ABNORMAL LOW (ref 34.8–46.0)
HGB: 10.4 g/dL — ABNORMAL LOW (ref 11.5–16.0)
IMMATURE GRANULOCYTE #: <0.1 10*3/uL (ref <0.10)
IMMATURE GRANULOCYTE %: 0 % (ref 0–1)
LYMPHOCYTE #: 1.16 10*3/uL (ref 1.00–4.80)
LYMPHOCYTE %: 15 %
MCH: 34.9 pg — ABNORMAL HIGH (ref 26.0–32.0)
MCHC: 32 g/dL (ref 31.0–35.5)
MCV: 109.1 fL — ABNORMAL HIGH (ref 78.0–100.0)
MONOCYTE #: 0.4 10*3/uL (ref 0.20–1.10)
MONOCYTE %: 5 %
MPV: 10.7 fL (ref 8.7–12.5)
NEUTROPHIL #: 6.05 10*3/uL (ref 1.50–7.70)
NEUTROPHIL %: 78 %
PLATELETS: 216 10*3/uL (ref 150–400)
RBC: 2.98 10*6/uL — ABNORMAL LOW (ref 3.85–5.22)
RDW-CV: 16.1 % — ABNORMAL HIGH (ref 11.5–15.5)
WBC: 7.8 10*3/uL (ref 3.7–11.0)

## 2021-08-28 LAB — COMPREHENSIVE METABOLIC PANEL, NON-FASTING
ALBUMIN: 3 g/dL — ABNORMAL LOW (ref 3.5–5.0)
ALKALINE PHOSPHATASE: 67 U/L (ref 50–130)
ALT (SGPT): 28 U/L — ABNORMAL HIGH (ref 8–22)
ANION GAP: 13 mmol/L (ref 4–13)
AST (SGOT): 23 U/L (ref 8–45)
BILIRUBIN TOTAL: 0.6 mg/dL (ref 0.3–1.3)
BUN/CREA RATIO: 3 — ABNORMAL LOW (ref 6–22)
BUN: 20 mg/dL (ref 8–25)
CALCIUM: 10.7 mg/dL — ABNORMAL HIGH (ref 8.5–10.0)
CHLORIDE: 97 mmol/L (ref 96–111)
CO2 TOTAL: 29 mmol/L (ref 22–30)
CREATININE: 5.88 mg/dL — ABNORMAL HIGH (ref 0.60–1.05)
ESTIMATED GFR: 8 mL/min/BSA — ABNORMAL LOW (ref >=60)
GLUCOSE: 218 mg/dL — ABNORMAL HIGH (ref 65–125)
POTASSIUM: 3.9 mmol/L (ref 3.5–5.1)
PROTEIN TOTAL: 7.6 g/dL (ref 6.4–8.3)
SODIUM: 139 mmol/L (ref 136–145)

## 2021-08-28 LAB — URINALYSIS, MICROSCOPIC

## 2021-08-28 LAB — URINALYSIS WITH MICROSCOPIC REFLEX IF INDICATED BMC/JMC ONLY
BILIRUBIN: NEGATIVE mg/dL
BLOOD: NEGATIVE mg/dL
GLUCOSE: 100 mg/dL — AB
KETONES: NEGATIVE mg/dL
LEUKOCYTES: NEGATIVE WBCs/uL
NITRITE: NEGATIVE
PH: 8 — ABNORMAL HIGH (ref <8.0)
PROTEIN: >=300 mg/dL — AB
SPECIFIC GRAVITY: 1.015 (ref <1.022)
UROBILINOGEN: 0.2 mg/dL (ref <=2.0)

## 2021-08-28 LAB — GI PANEL BY BIOFIRE FILM ARRAY
ADENOVIRUS F 40/41: NOT DETECTED
ASTROVIRUS: NOT DETECTED
CAMPYLOBACTER: NOT DETECTED
CLOSTRIDIUM DIFFICILE TOXIN A/B: DETECTED — AB
CRYPTOSPORIDIUM: NOT DETECTED
CYCLOSPORA CAYETANENSIS: NOT DETECTED
ENTAMOEBA HISTOLYTICA: NOT DETECTED
ENTEROAGGREGATIVE E. COLI (EAEC): NOT DETECTED
ENTEROPATHOGENIC E COLI (EPEC): NOT DETECTED
ENTEROTOXIGENIC E COLI (ETEC) LT/ST: NOT DETECTED
GIARDIA LAMBLIA: NOT DETECTED
NOROVIRUS GI/GII: NOT DETECTED
PLESIOMONAS SHIGELLOIDES: NOT DETECTED
ROTAVIRUS A: NOT DETECTED
SALMONELLA SPECIES: NOT DETECTED
SAPOVIRUS: NOT DETECTED
SHIGA-LIKE TOXIN-PRODUCING E COLI (STEC) STX1/STX2: NOT DETECTED
SHIGELLA/ENTEROINVASIVE E COLI (EIEC): NOT DETECTED
VIBRIO CHOLERAE: NOT DETECTED
VIBRIO: NOT DETECTED
YERSINIA ENTEROCOLITICA: NOT DETECTED

## 2021-08-28 LAB — LIPASE: LIPASE: 14 U/L (ref 10–60)

## 2021-08-28 LAB — HCG, PLASMA OR SERUM QUANTITATIVE, PREGNANCY: HCG QUANTITATIVE PREGNANCY: 9 m[IU]/mL (ref <15)

## 2021-08-28 MED ORDER — VANCOMYCIN 125 MG CAPSULE
125.0000 mg | ORAL_CAPSULE | Freq: Once | ORAL | Status: AC
Start: 2021-08-28 — End: 2021-08-28
  Administered 2021-08-28: 18:00:00 125 mg via ORAL
  Filled 2021-08-28: qty 1

## 2021-08-28 MED ORDER — VANCOMYCIN 50 MG/ML ORAL SOLUTION
125.0000 mg | Freq: Four times a day (QID) | ORAL | 0 refills | Status: DC
Start: 2021-08-28 — End: 2021-08-28

## 2021-08-28 MED ORDER — VANCOMYCIN 50 MG/ML ORAL SOLUTION
125.0000 mg | Freq: Four times a day (QID) | ORAL | 0 refills | Status: DC
Start: 2021-08-28 — End: 2021-09-11

## 2021-08-28 MED ORDER — SODIUM CHLORIDE 0.9 % (FLUSH) INJECTION SYRINGE
10.0000 mL | INJECTION | Freq: Three times a day (TID) | INTRAMUSCULAR | Status: DC
Start: 2021-08-28 — End: 2021-08-28

## 2021-08-28 NOTE — ED Triage Notes (Signed)
Pt comes in complaining diarrhea. She believes she may have c/diff, hx of same. Pt started with diarrhea this am and was recently on abx.

## 2021-08-28 NOTE — Discharge Instructions (Signed)
Please follow up with your primary care doctor in 7 days.  You will need to call to make this appointment.      Come back to the ER if you have any new concerning or worsening symptoms.

## 2021-08-28 NOTE — ED Nurses Note (Signed)
Patient discharged home with family.  AVS reviewed with patient/care giver.  A written copy of the AVS and discharge instructions was given to the patient/care giver.  Questions sufficiently answered as needed.  Patient/care giver encouraged to follow up with PCP as indicated.  In the event of an emergency, patient/care giver instructed to call 911 or go to the nearest emergency room.        Current Discharge Medication List        START taking these medications.        Details   vancomycin 50 mg/mL Recon Soln  Commonly known as: FIRVANQ   125 mg, Oral, EVERY 6 HOURS  Qty: 100 mL  Refills: 0            CONTINUE these medications - NO CHANGES were made during your visit.        Details   albuterol sulfate 90 mcg/actuation oral inhaler  Commonly known as: PROVENTIL or VENTOLIN or PROAIR   2 Puffs, Inhalation, EVERY 4 HOURS PRN  Qty: 2 Inhaler  Refills: 0     ALPRAZolam 0.25 mg Tablet  Commonly known as: XANAX   0.25 mg, Oral, DAILY PRN  Refills: 0     atorvastatin 20 mg Tablet  Commonly known as: LIPITOR   20 mg, Oral, EVERY EVENING  Refills: 0     cyclobenzaprine 5 mg Tablet  Commonly known as: FLEXERIL   5 mg, Oral, 3 TIMES DAILY PRN  Refills: 0     escitalopram oxalate 10 mg Tablet  Commonly known as: LEXAPRO   10 mg, Oral, NIGHTLY  Refills: 0     Fish Oil-Omega-3 Fatty Acids 360-1,200 mg Capsule   2 Capsules, Oral, 2 TIMES DAILY  Refills: 0     fluticasone propionate 50 mcg/actuation Spray, Suspension  Commonly known as: FLONASE   1 Spray, Each Nostril, DAILY PRN  Refills: 0     Januvia 50 mg Tablet  Generic drug: SITagliptin phosphate   50 mg, Oral, EVERY MORNING  Refills: 0     labetaloL 200 mg Tablet  Commonly known as: NORMODYNE   200 mg, Oral, EVERY 8 HOURS (SCHEDULED)  Qty: 90 Tablet  Refills: 0     loperamide 2 mg Capsule  Commonly known as: IMODIUM   4 mg, Oral, EVERY 6 HOURS PRN  Refills: 0     loratadine 10 mg Tablet  Commonly known as: CLARITIN   10 mg, Oral, DAILY PRN  Refills: 0     losartan 25 mg  Tablet  Commonly known as: COZAAR   25 mg, Oral, DAILY  Qty: 30 Tablet  Refills: 0     traMADoL 50 mg Tablet  Commonly known as: ULTRAM   50 mg, Oral, EVERY 6 HOURS PRN  Qty: 30 Tablet  Refills: 0     vitamin E 100 unit Capsule   100 Units, Oral, 2 TIMES DAILY  Refills: 0                Pt denies need for work/school note.  Pt ambulatory on departure with family.

## 2021-08-28 NOTE — ED Provider Notes (Signed)
[name] Oz, MD  [facility]  Emergency Department Visit Note    Date of Service: 08/28/2021  Primary Care Provider: Ocie Doyne, NP  Means of arrival: Private Vehicle     History obtained from: Patient  History/Exam limitations: None    Chief Complaint:  Diarrhea    History of Present Illness     [name], Feb 19, 1966, is a/an 55 y.o. female who presents to the Emergency Department with diarrhea starting yesterday.  The patient denies any nausea or vomiting.  She has had a history of C diff 4-5 years ago after getting antibiotics, and recently, has been having infection of her 2nd digit of her right foot, and is put on clindamycin for the past 5 days now.  She has a history of end-stage renal disease, on dialysis.    Review of Systems     The pertinent positive and negative symptoms are as per the HPI. They are otherwise negative as reviewed.    Patient History     Past Medical History:  Past Medical History:   Diagnosis Date   . Anxiety    . Arthritis     hips   . Beta blocker prescribed for left ventricular systolic dysfunction     Labetalol    . 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 Cataract     S/P cataract extraction bilaterally    . Cellulitis 10/24/14    left foot   . Cellulitis 10/21/2020    LLE extremity 09/2020-resolved per patient    . Chronic pain     sec. to arthritis-hips   . 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 Constipation    . COVID 07/2019   . Depression    . Diabetes mellitus (CMS [place])    . Diabetes mellitus, type 2 (CMS HCC)    . Diarrhea    . Dyspnea on exertion    . ESRD (end stage renal disease) (CMS [place])    . Essential hypertension    . Exercise intolerance    . Fatty liver    . Fluid overload 08/21/21    Recent hospitalization for fluid overload, HD initiated 10/18/20   . Headache(784.0)    . Hemodialysis patient (CMS [place])     HD Monday-Wednesday-Friday   .  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 History of nephrectomy 03/24/2020    L nephrectomy   . Hx of echocardiogram 10/18/2020    Technically difficult d/t pt. body habitus; Left vent. EF est. 55-60%; mildy calcified aortic vlave leaflets-especially right coroanry cusp; Small mobile density on aortic valve-no aortic valve stenosis or regurg; Concentric remodeling; mild MR; Trace AR; Trileaflet aortic vlave; Nl IVC size wiht <50% insp. collapse;      Marland Kitchen Hyperlipidemia    . Hyperparathyroidism (CMS [place])    . Hyperthyroidism     Old records states hyperthyroidism, patient denies, states has hyperparathyroidism   . Irritable bowel syndrome    . Macular edema    . Morbid obesity (CMS [place])    . Obesity    . Panic attack    . Peripheral edema     ankles/feet   . Peripheral neuropathy     bilateral feet   . Pneumonia 07/2019; 09/2020    COVID Pneumonia 07/2019; fluid overload/pneumonia with recent hosp. 09/2020   .  Shortness of breath     SOB after climbing 8 steps    . Type 2 diabetes mellitus (CMS HCC)    . Wears glasses     reading   . White coat hypertension          See Triage Note    Past Surgical History:  Past Surgical History:   Procedure Laterality Date   . AV FISTULA PLACEMENT Left 08/11/2020    LUE brachio cephalic fistula creation   . ECTOPIC PREGNANCY SURGERY  05/24/1998   . HX CATARACT REMOVAL Bilateral 2016   . HX CHOLECYSTECTOMY  09/10/96   . HX HAND SURGERY Right 2017    Trigger finger release 3rd finger   . HX OTHER Right 10/18/2020    tunneled dialysis catheter   . HX PELVIC LAPAROSCOPY  05/24/98    Ectopic pregnancy   . HX SHOULDER SURGERY Left 2017    Procedure for frozen shoulder   . HX TUBAL LIGATION  09/10/2012    Filshie Clips   . HYMENECTOMY  1996   . KIDNEY SURGERY  10/24/17; 03/24/20   . LAPAROSCOPIC NEPHRECTOMY, HAND ASSISTED Left 03/24/2020    Radical   . LAPAROSCOPIC PARTIAL NEPHRECTOMY Left 10/24/2017    hand-assisted   . RENAL BIOPSY, PERCUTANEOUS  03/08/2020    clear  cell renal cell carcinoma         See Triage Note    Family History:  Non-contributory    Social History:  Social History     Tobacco Use   . Smoking status: Never   . Smokeless tobacco: Never   Vaping Use   . Vaping Use: Never used   Substance Use Topics   . Alcohol use: No   . Drug use: No       Social History     Substance and Sexual Activity   Drug Use No       Current Outpatient Medications:   See triage note    Allergies:   Allergies   Allergen Reactions   . Vicodin [Hydrocodone-Acetaminophen] Itching       Physical Exam     Vital Signs:  Filed Vitals:    08/28/21 1330 08/28/21 1804   BP: (!) 139/55 (!) 140/77   Pulse: 78 75   Resp: 18 17   Temp: 36.8 C (98.3 F)    SpO2: 98% 98%       Pulse Ox interpreted by me:  98 % on RA, normal    Physical Exam:   Constitutional: No acute distress  Head: Normocephalic and atraumatic.   ENT: Moist mucous membranes. No erythema or exudates in the oropharynx. Normal voice.  Eyes: EOM are normal. Pupils are equal, round, and reactive to light. No scleral icterus.   Neck: Neck supple. FROM neck.  Cardiovascular: Normal rate and regular rhythm. No murmur heard. 2+ distal pulses all 4 extremities.  Pulmonary/Chest: Effort normal and breath sounds normal.   Abdominal: Soft with no distension. No abdominal tenderness  Back: There is no CVA tenderness.   Musculoskeletal:  No clubbing or cyanosis.  Left arm AV fistula, with a positive thrill felt.  Scabbed erythema to the left 2nd digit toe tip, with no discharge.  Neurological: Patient is alert and awake. Strength and sensation normal in all extremities. Normal facial symmetry and speech.   Skin: Skin is warm and dry.      Diagnostics     Labs:  Results for orders placed or performed during the hospital  encounter of 08/28/21   GI PANEL BY BIOFIRE FILM ARRAY ([facility])    Specimen: Stool   Result Value Ref Range    CAMPYLOBACTER Not Detected Not Detected    CLOSTRIDIUM DIFFICILE TOXIN A/B Detected (A) Not Detected     PLESIOMONAS SHIGELLOIDES Not Detected Not Detected    SALMONELLA Not Detected Not Detected    VIBRIO Not Detected Not Detected    VIBRIO CHOLERAE Not Detected Not Detected    YERSINIA ENTEROCOLITICA Not Detected Not Detected    ENTEROAGGREGATIVE E. COLI (EAEC) Not Detected Not Detected    ENTEROPATHOGENIC E COLI (EPEC) Not Detected Not Detected, N/A    ENTEROTOXIGENIC E COLI (ETEC) LT/ST Not Detected Not Detected    SHIGA-LIKE TOXIN-PRODUCING E COLI (STEC) STX1/STX2 Not Detected Not Detected    SHIGELLA/ENTEROINVASIVE E COLI (EIEC) Not Detected Not Detected    CRYPTOSPORIDIUM Not Detected Not Detected    CYCLOSPORA CAYETANENSIS Not Detected Not Detected    ENTAMOEBA HISTOLYTICA Not Detected Not Detected    GIARDIA LAMBLIA Not Detected Not Detected    ADENOVIRUS F 40/41 Not Detected Not Detected    ASTROVIRUS Not Detected Not Detected    NOROVIRUS GI/GII Not Detected Not Detected    ROTAVIRUS A Not Detected Not Detected    SAPOVIRUS Not Detected Not Detected   COMPREHENSIVE METABOLIC PANEL, NON-FASTING   Result Value Ref Range    SODIUM 139 136 - 145 mmol/L    POTASSIUM 3.9 3.5 - 5.1 mmol/L    CHLORIDE 97 96 - 111 mmol/L    CO2 TOTAL 29 22 - 30 mmol/L    ANION GAP 13 4 - 13 mmol/L    BUN 20 8 - 25 mg/dL    CREATININE 5.88 (H) 0.60 - 1.05 mg/dL    BUN/CREA RATIO 3 (L) 6 - 22    ESTIMATED GFR 8 (L) >=60 mL/min/BSA    ALBUMIN 3.0 (L) 3.5 - 5.0 g/dL     CALCIUM 10.7 (H) 8.5 - 10.0 mg/dL    GLUCOSE 218 (H) 65 - 125 mg/dL    ALKALINE PHOSPHATASE 67 50 - 130 U/L    ALT (SGPT) 28 (H) 8 - 22 U/L    AST (SGOT)  23 8 - 45 U/L    BILIRUBIN TOTAL 0.6 0.3 - 1.3 mg/dL    PROTEIN TOTAL 7.6 6.4 - 8.3 g/dL   LIPASE   Result Value Ref Range    LIPASE 14 10 - 60 U/L   URINALYSIS WITH MICROSCOPIC REFLEX IF INDICATED [facility] ONLY   Result Value Ref Range    COLOR Yellow Light Yellow, Straw, Yellow    APPEARANCE Clear Clear    PH 8.0 (H) <8.0    LEUKOCYTES Negative Negative WBCs/uL    NITRITE Negative Negative    PROTEIN >= 300 (A)  Negative, 10  mg/dL    GLUCOSE 100 (A) Negative mg/dL    KETONES Negative Negative mg/dL    UROBILINOGEN 0.2 <=2.0 mg/dL    BILIRUBIN Negative Negative mg/dL    BLOOD Negative Negative mg/dL    SPECIFIC GRAVITY 1.015 <1.022   HCG, PLASMA OR SERUM QUANTITATIVE, PREGNANCY   Result Value Ref Range    HCG QUANTITATIVE PREGNANCY 9 <15 mIU/mL   CBC WITH DIFF   Result Value Ref Range    WBC 7.8 3.7 - 11.0 x10^3/uL    RBC 2.98 (L) 3.85 - 5.22 x10^6/uL    HGB 10.4 (L) 11.5 - 16.0 g/dL    HCT 32.5 (L) 34.8 - 46.0 %  MCV 109.1 (H) 78.0 - 100.0 fL    MCH 34.9 (H) 26.0 - 32.0 pg    MCHC 32.0 31.0 - 35.5 g/dL    RDW-CV 16.1 (H) 11.5 - 15.5 %    PLATELETS 216 150 - 400 x10^3/uL    MPV 10.7 8.7 - 12.5 fL    NEUTROPHIL % 78 %    LYMPHOCYTE % 15 %    MONOCYTE % 5 %    EOSINOPHIL % 1 %    BASOPHIL % 1 %    NEUTROPHIL # 6.05 1.50 - 7.70 x10^3/uL    LYMPHOCYTE # 1.16 1.00 - 4.80 x10^3/uL    MONOCYTE # 0.40 0.20 - 1.10 x10^3/uL    EOSINOPHIL # <0.10 <=0.50 x10^3/uL    BASOPHIL # <0.10 <=0.20 x10^3/uL    IMMATURE GRANULOCYTE % 0 0 - 1 %    IMMATURE GRANULOCYTE # <0.10 <0.10 x10^3/uL   URINALYSIS, MICROSCOPIC   Result Value Ref Range    RBCS None 0 - 2 /hpf    WBCS None 0 - 2 /hpf    BACTERIA None None /hpf    SQUAMOUS EPITHELIAL 2-5 (A) 0 - 2 /hpf    TRANSITIONAL EPITHELIAL 0-2 0 - 2 /hpf     Labs reviewed and interpreted by me.          ED Progress Note/ Medical Decision Making     Old records reviewed by me:  I reviewed the patient's past medical records.    Orders Placed This Encounter   . GI PANEL BY BIOFIRE FILM ARRAY ([facility])   . CBC/DIFF   . COMPREHENSIVE METABOLIC PANEL, NON-FASTING   . LIPASE   . URINALYSIS WITH MICROSCOPIC REFLEX IF INDICATED [facility] ONLY   . HCG, PLASMA OR SERUM QUANTITATIVE, PREGNANCY   . CBC WITH DIFF   . URINALYSIS, MICROSCOPIC   . CANCELED: INSERT & MAINTAIN PERIPHERAL IV ACCESS   . vancomycin (VANCOCIN) capsule   . vancomycin (FIRVANQ) 50 mg/mL Oral Recon Soln     Patient has a history of C diff  colitis from years ago, recently put on antibiotics for cellulitis of her toe, is now here for diarrhea.  She denies any abdominal pain or tenderness of her abdomen.  She has no fevers and vital signs show that she has no tachycardia, not hypotensive, and not febrile.  No signs of sepsis, labs were drawn, which triage orders, and she will attempt to give [place] a bio fire stool sample.    Lab values showed no leukocytosis, and her GFR is unchanged from previously before with no critical electrolyte abnormalities.  I did let her know that her C diff toxin was positive, and this is consistent with C diff colitis.  She is not fulminant colitis at this point with no signs of sepsis in the believes that she would be able to be discharged home with oral vancomycin, and she agrees with this.  She will be given 1 dose here tonight, before discharge to take 125 q.i.d. for 10 days and to follow-up with her primary care doctor.    Pre-Disposition Vitals:  Filed Vitals:    08/28/21 1330 08/28/21 1804   BP: (!) 139/55 (!) 140/77   Pulse: 78 75   Resp: 18 17   Temp: 36.8 C (98.3 F)    SpO2: 98% 98%       Clinical Impression     1. C diff colitis, non fulminant  2. End-stage renal disease   3. Hyperglycemia  4. Anemia    Disposition/Plan     Discharged    Prescriptions:  Discharge Medication List as of 08/28/2021  6:25 PM          Follow Up:  Ocie Doyne, NP  [facility]  [place] [id number]  [phone]    In 1 week        Condition on Disposition: Stable

## 2021-08-29 ENCOUNTER — Ambulatory Visit: Payer: BC Managed Care – PPO | Admitting: Family Medicine

## 2021-08-29 ENCOUNTER — Encounter: Payer: Self-pay | Admitting: Family Medicine

## 2021-08-29 ENCOUNTER — Other Ambulatory Visit: Payer: Self-pay

## 2021-08-29 VITALS — BP 130/88 | HR 80 | Ht 69.0 in | Wt 199.0 lb

## 2021-08-29 DIAGNOSIS — Z0000 Encounter for general adult medical examination without abnormal findings: Secondary | ICD-10-CM | POA: Diagnosis not present

## 2021-08-29 DIAGNOSIS — Z90710 Acquired absence of both cervix and uterus: Secondary | ICD-10-CM | POA: Diagnosis not present

## 2021-08-29 DIAGNOSIS — R232 Flushing: Secondary | ICD-10-CM | POA: Diagnosis not present

## 2021-08-29 DIAGNOSIS — N6011 Diffuse cystic mastopathy of right breast: Secondary | ICD-10-CM

## 2021-08-29 DIAGNOSIS — N6012 Diffuse cystic mastopathy of left breast: Secondary | ICD-10-CM

## 2021-08-29 NOTE — Progress Notes (Signed)
Date:  08/29/2021   Name:  Andrea Henry   DOB:  September 06, 1966   MRN:  [id number]   Chief Complaint: Establish Care  Patient is a 55 year old female who presents for a establish care exam. The patient reports the following problems: none. Health maintenance has been reviewed need mammogram.     No results found for: NA, K, CO2, GLUCOSE, BUN, CREATININE, CALCIUM, EGFR, GFRNONAA, GLUCOSE No results found for: CHOL, HDL, LDLCALC, LDLDIRECT, TRIG, CHOLHDL No results found for: TSH No results found for: HGBA1C No results found for: WBC, HGB, HCT, MCV, PLT No results found for: ALT, AST, GGT, ALKPHOS, BILITOT No results found for: 25OHVITD2, 25OHVITD3, VD25OH   Review of Systems  Constitutional:  Positive for unexpected weight change. Negative for activity change, appetite change and fatigue.  HENT:  Negative for congestion, dental problem, drooling, ear discharge, ear pain and trouble swallowing.   Eyes:  Positive for visual disturbance.  Respiratory:  Negative for shortness of breath.   Cardiovascular:  Negative for chest pain, palpitations and leg swelling.  Gastrointestinal:  Positive for constipation. Negative for abdominal pain, blood in stool and diarrhea.  Endocrine: Positive for heat intolerance. Negative for cold intolerance, polydipsia and polyuria.  Genitourinary:  Positive for frequency. Negative for hematuria and pelvic pain.  Skin:  Negative for color change.  Hematological:  Negative for adenopathy.  Psychiatric/Behavioral:         Hx depression/15 yrs   There are no problems to display for this patient.   Allergies  Allergen Reactions   Iodine Hives    Past Surgical History:  Procedure Laterality Date   ABDOMINAL HYSTERECTOMY     BREAST BIOPSY Right    cyst bilateral    Social History   Tobacco Use   Smoking status: Never   Smokeless tobacco: Never  Substance Use Topics   Alcohol use: Yes    Comment: wine x 6 nights a week   Drug use: Not Currently      Medication list has been reviewed and updated.  No outpatient medications have been marked as taking for the 08/29/21 encounter (Office Visit) with Juline Patch, MD.    [facility] 2/9 Scores 08/29/2021  PHQ - 2 Score 0  PHQ- 9 Score 4    GAD 7 : Generalized Anxiety Score 08/29/2021  Nervous, Anxious, on Edge 1  Control/stop worrying 1  Worry too much - different things 1  Trouble relaxing 1  Restless 1  Easily annoyed or irritable 2  Afraid - awful might happen 1  Total GAD 7 Score 8  Anxiety Difficulty Not difficult at all    BP Readings from Last 3 Encounters:  08/29/21 130/88    Physical Exam Vitals and nursing note reviewed.  HENT:     Head: Normocephalic.     Right Ear: Tympanic membrane and ear canal normal.     Left Ear: Tympanic membrane and ear canal normal.     Nose: Nose normal.  Neck:     Vascular: No carotid bruit.  Cardiovascular:     Rate and Rhythm: Normal rate.     Pulses: Normal pulses.     Heart sounds: Normal heart sounds. No murmur heard.   No gallop.  Pulmonary:     Breath sounds: No wheezing, rhonchi or rales.  Chest:     Chest wall: No tenderness.  Abdominal:     Tenderness: There is no abdominal tenderness. There is no guarding.  Musculoskeletal:  Cervical back: Normal range of motion and neck supple.  Skin:    General: Skin is warm.  Neurological:     General: No focal deficit present.     Mental Status: She is alert.    Wt Readings from Last 3 Encounters:  08/29/21 199 lb (90.3 kg)    BP 130/88   Pulse 80   Ht 5' 9"  (1.753 m)   Wt 199 lb (90.3 kg)   BMI 29.39 kg/m   Assessment and Plan:  1. Encounter for medical examination to establish care Patient establishing care with new physician.  Review of systems review of history of present illness and current physical exam is unremarkable.  2. Vasomotor flushing Patient used to be on estradiol/patches for vasomotor flushing but has been unable to obtain since the spring.   Discussed with patient that we will be referring to GYN and that whether to resume this versus other approach will be a risk-benefit discussion at the GYN level. - Ambulatory referral to Obstetrics / Gynecology  3. Fibrocystic breast changes of both breasts Patient has had a history of fibrocystic disease which she is had breast cyst aspirated.  Discussion as to obtaining mammogram views from previous physicians as well as further scheduling since the last mammogram has been over a year. - Ambulatory referral to Obstetrics / Gynecology  4. History of hysterectomy Discussed with patient that she has a "partial hysterectomy" which actually sounds like she has had a total hysterectomy with a partial oophorectomy bilateral.  This will be discussed and and followed by GYN as well. - Ambulatory referral to Obstetrics / Gynecology

## 2021-08-29 NOTE — Patient Instructions (Signed)
Why follow it? Research shows. Those who follow the Mediterranean diet have a reduced risk of heart disease  The diet is associated with a reduced incidence of Parkinson's and Alzheimer's diseases People following the diet may have longer life expectancies and lower rates of chronic diseases  The Dietary Guidelines for Americans recommends the Mediterranean diet as an eating plan to promote health and prevent disease  What Is the Mediterranean Diet?  Healthy eating plan based on typical foods and recipes of Mediterranean-style cooking The diet is primarily a plant based diet; these foods should make up a majority of meals   Starches - Plant based foods should make up a majority of meals - They are an important sources of vitamins, minerals, energy, antioxidants, and fiber - Choose whole grains, foods high in fiber and minimally processed items  - Typical grain sources include wheat, oats, barley, corn, brown rice, bulgar, farro, millet, polenta, couscous  - Various types of beans include chickpeas, lentils, fava beans, black beans, white beans   Fruits  Veggies - Large quantities of antioxidant rich fruits & veggies; 6 or more servings  - Vegetables can be eaten raw or lightly drizzled with oil and cooked  - Vegetables common to the traditional Mediterranean Diet include: artichokes, arugula, beets, broccoli, brussel sprouts, cabbage, carrots, celery, collard greens, cucumbers, eggplant, kale, leeks, lemons, lettuce, mushrooms, okra, onions, peas, peppers, potatoes, pumpkin, radishes, rutabaga, shallots, spinach, sweet potatoes, turnips, zucchini - Fruits common to the Mediterranean Diet include: apples, apricots, avocados, cherries, clementines, dates, figs, grapefruits, grapes, melons, nectarines, oranges, peaches, pears, pomegranates, strawberries, tangerines  Fats - Replace butter and margarine with healthy oils, such as olive oil, canola oil, and tahini  - Limit nuts to no more than a  handful a day  - Nuts include walnuts, almonds, pecans, pistachios, pine nuts  - Limit or avoid candied, honey roasted or heavily salted nuts - Olives are central to the Mediterranean diet - can be eaten whole or used in a variety of dishes   Meats Protein - Limiting red meat: no more than a few times a month - When eating red meat: choose lean cuts and keep the portion to the size of deck of cards - Eggs: approx. 0 to 4 times a week  - Fish and lean poultry: at least 2 a week  - Healthy protein sources include, chicken, turkey, lean beef, lamb - Increase intake of seafood such as tuna, salmon, trout, mackerel, shrimp, scallops - Avoid or limit high fat processed meats such as sausage and bacon  Dairy - Include moderate amounts of low fat dairy products  - Focus on healthy dairy such as fat free yogurt, skim milk, low or reduced fat cheese - Limit dairy products higher in fat such as whole or 2% milk, cheese, ice cream  Alcohol - Moderate amounts of red wine is ok  - No more than 5 oz daily for women (all ages) and men older than age 65  - No more than 10 oz of wine daily for men younger than 65  Other - Limit sweets and other desserts  - Use herbs and spices instead of salt to flavor foods  - Herbs and spices common to the traditional Mediterranean Diet include: basil, bay leaves, chives, cloves, cumin, fennel, garlic, lavender, marjoram, mint, oregano, parsley, pepper, rosemary, sage, savory, sumac, tarragon, thyme   It's not just a diet, it's a lifestyle:  The Mediterranean diet includes lifestyle factors typical of those in the   region  Foods, drinks and meals are best eaten with others and savored Daily physical activity is important for overall good health This could be strenuous exercise like running and aerobics This could also be more leisurely activities such as walking, housework, yard-work, or taking the stairs Moderation is the key; a balanced and healthy diet accommodates most  foods and drinks Consider portion sizes and frequency of consumption of certain foods   Meal Ideas & Options:  Breakfast:  Whole wheat toast or whole wheat English muffins with peanut butter & hard boiled egg Steel cut oats topped with apples & cinnamon and skim milk  Fresh fruit: banana, strawberries, melon, berries, peaches  Smoothies: strawberries, bananas, greek yogurt, peanut butter Low fat greek yogurt with blueberries and granola  Egg white omelet with spinach and mushrooms Breakfast couscous: whole wheat couscous, apricots, skim milk, cranberries  Sandwiches:  Hummus and grilled vegetables (peppers, zucchini, squash) on whole wheat bread   Grilled chicken on whole wheat pita with lettuce, tomatoes, cucumbers or tzatziki  Tuna salad on whole wheat bread: tuna salad made with greek yogurt, olives, red peppers, capers, green onions Garlic rosemary lamb pita: lamb sauted with garlic, rosemary, salt & pepper; add lettuce, cucumber, greek yogurt to pita - flavor with lemon juice and black pepper  Seafood:  Mediterranean grilled salmon, seasoned with garlic, basil, parsley, lemon juice and black pepper Shrimp, lemon, and spinach whole-grain pasta salad made with low fat greek yogurt  Seared scallops with lemon orzo  Seared tuna steaks seasoned salt, pepper, coriander topped with tomato mixture of olives, tomatoes, olive oil, minced garlic, parsley, green onions and cappers  Meats:  Herbed greek chicken salad with kalamata olives, cucumber, feta  Red bell peppers stuffed with spinach, bulgur, lean ground beef (or lentils) & topped with feta   Kebabs: skewers of chicken, tomatoes, onions, zucchini, squash  Turkey burgers: made with red onions, mint, dill, lemon juice, feta cheese topped with roasted red peppers Vegetarian Cucumber salad: cucumbers, artichoke hearts, celery, red onion, feta cheese, tossed in olive oil & lemon juice  Hummus and whole grain pita points with a greek salad  (lettuce, tomato, feta, olives, cucumbers, red onion) Lentil soup with celery, carrots made with vegetable broth, garlic, salt and pepper  Tabouli salad: parsley, bulgur, mint, scallions, cucumbers, tomato, radishes, lemon juice, olive oil, salt and pepper.      

## 2021-09-01 ENCOUNTER — Encounter (INDEPENDENT_AMBULATORY_CARE_PROVIDER_SITE_OTHER): Payer: 59 | Admitting: Urology

## 2021-09-01 ENCOUNTER — Telehealth: Payer: Self-pay

## 2021-09-01 NOTE — Telephone Encounter (Signed)
Andrea Henry Medical referring for Annual exam. Called and left voicemail for patient to call back to be scheduled. Patient prefers Andrea Henry.

## 2021-09-05 NOTE — Telephone Encounter (Signed)
Pt is scheduled with [facility] on 1/19.

## 2021-09-06 ENCOUNTER — Ambulatory Visit: Payer: 59 | Attending: Gerontology | Admitting: Gerontology

## 2021-09-06 ENCOUNTER — Encounter (RURAL_HEALTH_CENTER): Payer: Self-pay | Admitting: Gerontology

## 2021-09-06 VITALS — Wt 270.0 lb

## 2021-09-06 DIAGNOSIS — J069 Acute upper respiratory infection, unspecified: Secondary | ICD-10-CM

## 2021-09-06 NOTE — Progress Notes (Unsigned)
Subjective:     Patient ID: Kelli Carlson is a 55 y.o. female.  Chief Complaint   Patient presents with    URI     Sore throat, cough, congestion, pressure     This visit was conducted with the use of interactive audio/telecommunication that permitted real time communication between [place] A and Ocie Doyne, NP . she consented to participation and received services at home, while I was located at [facility]    PHONE    HPI: Is currently on Vancomycin    The following portions of the patient's history were reviewed and updated as appropriate: allergies, current medications, past family history, past medical history, past social history, past surgical history and problem list.    Social History     Socioeconomic History    Marital status: Married   Tobacco Use    Smoking status: Never    Smokeless tobacco: Never   Vaping Use    Vaping Use: Never used   Substance and Sexual Activity    Alcohol use: No     Alcohol/week: 0.0 standard drinks    Drug use: No    Sexual activity: Yes     Review of Systems                    Objective:    Physical Exam  Weight 122.5 kg (270 lb).        Medical Decision Making:   {ASSESS/PLANLIST:50367}    No follow-ups on file.      Follow up sooner if no improvement or if symptoms worsen or persist.   Medication dosing instructions, drug-drug interactions and potential side effects discussed in detail.   Failure to comply with plan of care discussed with patient. Importance of adhering to follow up office visits discussed in detail.   Potential risks of noncompliance discussed with patient.    Ocie Doyne, NP

## 2021-09-08 ENCOUNTER — Other Ambulatory Visit (HOSPITAL_COMMUNITY): Payer: 59

## 2021-09-09 ENCOUNTER — Telehealth (RURAL_HEALTH_CENTER): Payer: Self-pay

## 2021-09-09 MED ORDER — AZITHROMYCIN 250 MG PO TABS
ORAL_TABLET | ORAL | 0 refills | Status: AC
Start: 2021-09-09 — End: 2021-09-13

## 2021-09-09 NOTE — Telephone Encounter (Signed)
Sent a zpack to [facility]

## 2021-09-09 NOTE — Telephone Encounter (Signed)
Patient called back again today and LM stating that she is still not feeling better. She wants something sent into the pharmacy. She is very congested, cough, and losing his voice.

## 2021-09-09 NOTE — Telephone Encounter (Signed)
Patient advised.

## 2021-09-11 ENCOUNTER — Other Ambulatory Visit: Payer: Self-pay

## 2021-09-11 ENCOUNTER — Inpatient Hospital Stay
Admission: EM | Admit: 2021-09-11 | Discharge: 2021-09-15 | DRG: 193 | Disposition: A | Discharge status: Home / Self Care | Payer: 59 | Attending: INTERNAL MEDICINE | Admitting: INTERNAL MEDICINE

## 2021-09-11 ENCOUNTER — Emergency Department (EMERGENCY_DEPARTMENT_HOSPITAL): Payer: 59

## 2021-09-11 ENCOUNTER — Inpatient Hospital Stay (HOSPITAL_COMMUNITY): Payer: 59 | Admitting: INTERNAL MEDICINE

## 2021-09-11 ENCOUNTER — Encounter (HOSPITAL_COMMUNITY): Payer: Self-pay

## 2021-09-11 DIAGNOSIS — E785 Hyperlipidemia, unspecified: Secondary | ICD-10-CM | POA: Diagnosis present

## 2021-09-11 DIAGNOSIS — Z7984 Long term (current) use of oral hypoglycemic drugs: Secondary | ICD-10-CM

## 2021-09-11 DIAGNOSIS — Z992 Dependence on renal dialysis: Secondary | ICD-10-CM

## 2021-09-11 DIAGNOSIS — J189 Pneumonia, unspecified organism: Principal | ICD-10-CM | POA: Diagnosis present

## 2021-09-11 DIAGNOSIS — I10 Essential (primary) hypertension: Secondary | ICD-10-CM | POA: Diagnosis present

## 2021-09-11 DIAGNOSIS — Z8616 Personal history of COVID-19: Secondary | ICD-10-CM

## 2021-09-11 DIAGNOSIS — I21A1 Myocardial infarction type 2: Secondary | ICD-10-CM | POA: Diagnosis present

## 2021-09-11 DIAGNOSIS — N186 End stage renal disease: Secondary | ICD-10-CM | POA: Diagnosis present

## 2021-09-11 DIAGNOSIS — J849 Interstitial pulmonary disease, unspecified: Secondary | ICD-10-CM

## 2021-09-11 DIAGNOSIS — Z905 Acquired absence of kidney: Secondary | ICD-10-CM

## 2021-09-11 DIAGNOSIS — Z85528 Personal history of other malignant neoplasm of kidney: Secondary | ICD-10-CM

## 2021-09-11 DIAGNOSIS — R0989 Other specified symptoms and signs involving the circulatory and respiratory systems: Secondary | ICD-10-CM

## 2021-09-11 DIAGNOSIS — R778 Other specified abnormalities of plasma proteins: Secondary | ICD-10-CM

## 2021-09-11 DIAGNOSIS — E8770 Fluid overload, unspecified: Secondary | ICD-10-CM | POA: Diagnosis present

## 2021-09-11 DIAGNOSIS — I120 Hypertensive chronic kidney disease with stage 5 chronic kidney disease or end stage renal disease: Secondary | ICD-10-CM | POA: Diagnosis present

## 2021-09-11 DIAGNOSIS — E1142 Type 2 diabetes mellitus with diabetic polyneuropathy: Secondary | ICD-10-CM | POA: Diagnosis present

## 2021-09-11 DIAGNOSIS — E1122 Type 2 diabetes mellitus with diabetic chronic kidney disease: Secondary | ICD-10-CM | POA: Diagnosis present

## 2021-09-11 DIAGNOSIS — Z9115 Patient's noncompliance with renal dialysis: Secondary | ICD-10-CM

## 2021-09-11 DIAGNOSIS — Z6839 Body mass index (BMI) 39.0-39.9, adult: Secondary | ICD-10-CM

## 2021-09-11 DIAGNOSIS — R0600 Dyspnea, unspecified: Secondary | ICD-10-CM

## 2021-09-11 DIAGNOSIS — E119 Type 2 diabetes mellitus without complications: Secondary | ICD-10-CM | POA: Diagnosis present

## 2021-09-11 DIAGNOSIS — E213 Hyperparathyroidism, unspecified: Secondary | ICD-10-CM | POA: Diagnosis present

## 2021-09-11 DIAGNOSIS — Z20822 Contact with and (suspected) exposure to covid-19: Secondary | ICD-10-CM | POA: Diagnosis present

## 2021-09-11 DIAGNOSIS — D849 Immunodeficiency, unspecified: Secondary | ICD-10-CM | POA: Diagnosis present

## 2021-09-11 DIAGNOSIS — Z79899 Other long term (current) drug therapy: Secondary | ICD-10-CM

## 2021-09-11 DIAGNOSIS — R0602 Shortness of breath: Secondary | ICD-10-CM

## 2021-09-11 DIAGNOSIS — E11311 Type 2 diabetes mellitus with unspecified diabetic retinopathy with macular edema: Secondary | ICD-10-CM | POA: Diagnosis present

## 2021-09-11 DIAGNOSIS — Z8701 Personal history of pneumonia (recurrent): Secondary | ICD-10-CM

## 2021-09-11 DIAGNOSIS — R918 Other nonspecific abnormal finding of lung field: Secondary | ICD-10-CM

## 2021-09-11 DIAGNOSIS — E0590 Thyrotoxicosis, unspecified without thyrotoxic crisis or storm: Secondary | ICD-10-CM | POA: Diagnosis present

## 2021-09-11 LAB — CBC WITH DIFF
BASOPHIL #: <0.1 10*3/uL (ref <=0.20)
BASOPHIL %: 0 %
EOSINOPHIL #: <0.1 10*3/uL (ref <=0.50)
EOSINOPHIL %: 0 %
HCT: 31 % — ABNORMAL LOW (ref 34.8–46.0)
HGB: 10 g/dL — ABNORMAL LOW (ref 11.5–16.0)
IMMATURE GRANULOCYTE #: <0.1 10*3/uL (ref <0.10)
IMMATURE GRANULOCYTE %: 1 % (ref 0–1)
LYMPHOCYTE #: 0.5 10*3/uL — ABNORMAL LOW (ref 1.00–4.80)
LYMPHOCYTE %: 5 %
MCH: 33.9 pg — ABNORMAL HIGH (ref 26.0–32.0)
MCHC: 32.3 g/dL (ref 31.0–35.5)
MCV: 105.1 fL — ABNORMAL HIGH (ref 78.0–100.0)
MONOCYTE #: 0.41 10*3/uL (ref 0.20–1.10)
MONOCYTE %: 4 %
MPV: 10.3 fL (ref 8.7–12.5)
NEUTROPHIL #: 8.38 10*3/uL — ABNORMAL HIGH (ref 1.50–7.70)
NEUTROPHIL %: 90 %
PLATELETS: 207 10*3/uL (ref 150–400)
RBC: 2.95 10*6/uL — ABNORMAL LOW (ref 3.85–5.22)
RDW-CV: 15.8 % — ABNORMAL HIGH (ref 11.5–15.5)
WBC: 9.4 10*3/uL (ref 3.7–11.0)

## 2021-09-11 LAB — TROPONIN-I
TROPONIN I: 1424 ng/L (ref <=30)
TROPONIN I: 2455 ng/L (ref <=30)

## 2021-09-11 LAB — COMPREHENSIVE METABOLIC PANEL, NON-FASTING
ALBUMIN: 2.9 g/dL — ABNORMAL LOW (ref 3.5–5.0)
ALKALINE PHOSPHATASE: 65 U/L (ref 50–130)
ALT (SGPT): 21 U/L (ref 8–22)
ANION GAP: 15 mmol/L — ABNORMAL HIGH (ref 4–13)
AST (SGOT): 19 U/L (ref 8–45)
BILIRUBIN TOTAL: 0.7 mg/dL (ref 0.3–1.3)
BUN/CREA RATIO: 5 — ABNORMAL LOW (ref 6–22)
BUN: 32 mg/dL — ABNORMAL HIGH (ref 8–25)
CALCIUM: 9.4 mg/dL (ref 8.5–10.0)
CHLORIDE: 101 mmol/L (ref 96–111)
CO2 TOTAL: 20 mmol/L — ABNORMAL LOW (ref 22–30)
CREATININE: 6.15 mg/dL — ABNORMAL HIGH (ref 0.60–1.05)
ESTIMATED GFR: 8 mL/min/BSA — ABNORMAL LOW (ref >=60)
GLUCOSE: 172 mg/dL — ABNORMAL HIGH (ref 65–125)
POTASSIUM: 4.1 mmol/L (ref 3.5–5.1)
PROTEIN TOTAL: 6.9 g/dL (ref 6.4–8.3)
SODIUM: 136 mmol/L (ref 136–145)

## 2021-09-11 LAB — COVID-19, FLU A/B, RSV RAPID BY PCR
INFLUENZA VIRUS TYPE A: NOT DETECTED
INFLUENZA VIRUS TYPE B: NOT DETECTED
RESPIRATORY SYNCTIAL VIRUS (RSV): NOT DETECTED
SARS-CoV-2: NOT DETECTED

## 2021-09-11 LAB — POC FINGERSTICK GLUCOSE - BMC/JMC (RESULTS)
GLUCOSE, POC: 133 mg/dl — ABNORMAL HIGH (ref 60–100)
GLUCOSE, POC: 127 mg/dl — ABNORMAL HIGH (ref 60–100)
GLUCOSE, POC: 174 mg/dl — ABNORMAL HIGH (ref 60–100)

## 2021-09-11 MED ORDER — ATORVASTATIN 20 MG TABLET
20.0000 mg | ORAL_TABLET | Freq: Every evening | ORAL | Status: DC
Start: 2021-09-11 — End: 2021-09-15
  Administered 2021-09-11 – 2021-09-14 (×4): 20 mg via ORAL
  Filled 2021-09-11 (×4): qty 1

## 2021-09-11 MED ORDER — ESCITALOPRAM 10 MG TABLET
10.0000 mg | ORAL_TABLET | Freq: Every evening | ORAL | Status: DC
Start: 2021-09-11 — End: 2021-09-15
  Administered 2021-09-11 – 2021-09-14 (×4): 10 mg via ORAL
  Filled 2021-09-11 (×4): qty 1

## 2021-09-11 MED ORDER — FLUTICASONE PROPIONATE 50 MCG/ACTUATION NASAL SPRAY,SUSPENSION
1.0000 | Freq: Every day | NASAL | Status: DC | PRN
Start: 2021-09-11 — End: 2021-09-15

## 2021-09-11 MED ORDER — ACETAMINOPHEN 325 MG TABLET
650.0000 mg | ORAL_TABLET | Freq: Four times a day (QID) | ORAL | Status: DC | PRN
Start: 2021-09-11 — End: 2021-09-15

## 2021-09-11 MED ORDER — ALBUTEROL SULFATE HFA 90 MCG/ACTUATION AEROSOL INHALER
1.0000 | INHALATION_SPRAY | Freq: Four times a day (QID) | RESPIRATORY_TRACT | Status: DC | PRN
Start: 2021-09-11 — End: 2021-09-15

## 2021-09-11 MED ORDER — SODIUM CHLORIDE 0.9 % INTRAVENOUS SOLUTION
500.0000 mg | INTRAVENOUS | Status: AC
Start: 2021-09-11 — End: 2021-09-11
  Administered 2021-09-11: 0 mg via INTRAVENOUS
  Administered 2021-09-11: 500 mg via INTRAVENOUS
  Filled 2021-09-11: qty 5

## 2021-09-11 MED ORDER — TRAMADOL 50 MG TABLET
50.0000 mg | ORAL_TABLET | Freq: Four times a day (QID) | ORAL | Status: DC | PRN
Start: 2021-09-11 — End: 2021-09-15
  Administered 2021-09-11: 50 mg via ORAL
  Filled 2021-09-11: qty 1

## 2021-09-11 MED ORDER — DOXYCYCLINE HYCLATE 100 MG TABLET
100.0000 mg | ORAL_TABLET | Freq: Two times a day (BID) | ORAL | Status: DC
Start: 2021-09-12 — End: 2021-09-15
  Administered 2021-09-12 – 2021-09-15 (×7): 100 mg via ORAL
  Filled 2021-09-11 (×9): qty 1

## 2021-09-11 MED ORDER — SODIUM CHLORIDE 0.9 % (FLUSH) INJECTION SYRINGE
10.0000 mL | INJECTION | Freq: Three times a day (TID) | INTRAMUSCULAR | Status: DC
Start: 2021-09-11 — End: 2021-09-15
  Administered 2021-09-11 – 2021-09-14 (×10): 10 mL via INTRAVENOUS
  Administered 2021-09-14: 0 mL via INTRAVENOUS
  Administered 2021-09-14 – 2021-09-15 (×2): 10 mL via INTRAVENOUS
  Administered 2021-09-15: 0 mL via INTRAVENOUS

## 2021-09-11 MED ORDER — ENOXAPARIN 30 MG/0.3 ML SUB-Q SYRINGE - EAST
30.0000 mg | INJECTION | Freq: Every day | SUBCUTANEOUS | Status: DC
Start: 2021-09-11 — End: 2021-09-15
  Administered 2021-09-11 – 2021-09-15 (×5): 30 mg via SUBCUTANEOUS
  Filled 2021-09-11 (×5): qty 0.3

## 2021-09-11 MED ORDER — LINAGLIPTIN 5 MG TABLET
5.0000 mg | ORAL_TABLET | Freq: Every day | ORAL | Status: DC
Start: 2021-09-11 — End: 2021-09-15
  Administered 2021-09-11 – 2021-09-15 (×5): 5 mg via ORAL
  Filled 2021-09-11 (×5): qty 1

## 2021-09-11 MED ORDER — SODIUM CHLORIDE 0.9 % INTRAVENOUS PIGGYBACK
2.0000 g | INTRAVENOUS | Status: AC
Start: 2021-09-11 — End: 2021-09-11
  Administered 2021-09-11: 2 g via INTRAVENOUS
  Administered 2021-09-11: 0 g via INTRAVENOUS
  Filled 2021-09-11: qty 20

## 2021-09-11 MED ORDER — SODIUM CHLORIDE 0.9 % INTRAVENOUS PIGGYBACK
2.0000 g | INTRAVENOUS | Status: DC
Start: 2021-09-12 — End: 2021-09-15
  Administered 2021-09-12: 2 g via INTRAVENOUS
  Administered 2021-09-12: 0 g via INTRAVENOUS
  Administered 2021-09-13: 2 g via INTRAVENOUS
  Administered 2021-09-13 – 2021-09-14 (×2): 0 g via INTRAVENOUS
  Administered 2021-09-14 – 2021-09-15 (×2): 2 g via INTRAVENOUS
  Administered 2021-09-15: 0 g via INTRAVENOUS
  Filled 2021-09-11 (×4): qty 20

## 2021-09-11 MED ORDER — SODIUM CHLORIDE 0.9 % (FLUSH) INJECTION SYRINGE
10.0000 mL | INJECTION | Freq: Three times a day (TID) | INTRAMUSCULAR | Status: DC
Start: 2021-09-11 — End: 2021-09-15
  Administered 2021-09-11: 0 mL via INTRAVENOUS
  Administered 2021-09-11: 10 mL via INTRAVENOUS
  Administered 2021-09-12: 0 mL via INTRAVENOUS
  Administered 2021-09-12 (×2): 10 mL via INTRAVENOUS
  Administered 2021-09-13 (×2): 0 mL via INTRAVENOUS
  Administered 2021-09-13: 10 mL via INTRAVENOUS
  Administered 2021-09-14 (×3): 0 mL via INTRAVENOUS
  Administered 2021-09-15 (×2): 10 mL via INTRAVENOUS

## 2021-09-11 MED ORDER — ONDANSETRON HCL (PF) 4 MG/2 ML INJECTION SOLUTION
4.0000 mg | Freq: Three times a day (TID) | INTRAMUSCULAR | Status: DC | PRN
Start: 2021-09-11 — End: 2021-09-15
  Administered 2021-09-11: 4 mg via INTRAVENOUS
  Filled 2021-09-11: qty 2

## 2021-09-11 MED ORDER — LABETALOL 100 MG TABLET
200.0000 mg | ORAL_TABLET | Freq: Three times a day (TID) | ORAL | Status: DC
Start: 2021-09-11 — End: 2021-09-12
  Administered 2021-09-11 (×2): 200 mg via ORAL
  Filled 2021-09-11 (×2): qty 2

## 2021-09-11 MED ORDER — LOSARTAN 25 MG TABLET
25.0000 mg | ORAL_TABLET | Freq: Every day | ORAL | Status: DC
Start: 2021-09-11 — End: 2021-09-12
  Administered 2021-09-11: 25 mg via ORAL
  Filled 2021-09-11: qty 1

## 2021-09-11 MED ORDER — LORATADINE 10 MG TABLET
10.0000 mg | ORAL_TABLET | Freq: Every day | ORAL | Status: DC | PRN
Start: 2021-09-11 — End: 2021-09-15

## 2021-09-11 MED ORDER — SODIUM CHLORIDE 0.9% FLUSH BAG - 250 ML
INTRAVENOUS | Status: DC | PRN
Start: 2021-09-11 — End: 2021-09-15

## 2021-09-11 MED ORDER — ALPRAZOLAM 0.5 MG TABLET
0.2500 mg | ORAL_TABLET | Freq: Every day | ORAL | Status: DC | PRN
Start: 2021-09-11 — End: 2021-09-15

## 2021-09-11 MED ORDER — SODIUM CHLORIDE 0.9 % (FLUSH) INJECTION SYRINGE
10.0000 mL | INJECTION | INTRAMUSCULAR | Status: DC | PRN
Start: 2021-09-11 — End: 2021-09-15

## 2021-09-11 MED ORDER — CYCLOBENZAPRINE 10 MG TABLET
5.0000 mg | ORAL_TABLET | Freq: Three times a day (TID) | ORAL | Status: DC | PRN
Start: 2021-09-11 — End: 2021-09-15

## 2021-09-11 MED ORDER — NITROGLYCERIN 0.4 MG SUBLINGUAL TABLET
0.4000 mg | SUBLINGUAL_TABLET | SUBLINGUAL | Status: DC | PRN
Start: 2021-09-11 — End: 2021-09-15

## 2021-09-11 MED ORDER — MORPHINE 4 MG/ML INTRAVENOUS SOLUTION
4.0000 mg | INTRAVENOUS | Status: DC | PRN
Start: 2021-09-11 — End: 2021-09-15

## 2021-09-11 NOTE — ED Triage Notes (Addendum)
Pt states onset of shortness of breath and chest tightness x 2 days. States runny nose.  States productive cough with yellow phlegm.

## 2021-09-11 NOTE — H&P (Signed)
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  She started having cough and shortness of breath approximately 1 week ago and due to this she missed 2 dialysis sessions during which time she got progressively weak and nauseated.  In ER, vitals notable for hypertension, tachycardia, tachypnea.  Labs with chronic anemia.  Patient given 1st dose of antibiotics and admitted for pneumonia and volume overload secondary to missing dialysis sessions.    Active Hospital Problems   (*Primary Problem)    Diagnosis   . *Volume overload   . ESRD on dialysis (CMS HCC)   . Essential hypertension     Chronic     Not at goal. Losartan increased already. Will add Amlodipine 5. Goal <120/80     . Type 2 diabetes mellitus (CMS HCC)     Chronic     Control much improved - A1C 5.2% in January. Continue current regimen         Past Medical History:   Diagnosis Date   . Anxiety    . Arthritis     hips   . Beta blocker prescribed for left ventricular systolic dysfunction     Labetalol    . Cancer (CMS [place]) 2019, 2021    kidney, Clear Cell Cancer left kidney-S/P partial nephrectomy 2019, then radical nephrectomy 2021   . Cataract     S/P cataract extraction bilaterally    . Cellulitis 10/24/14    left foot   . Cellulitis 10/21/2020    LLE extremity 09/2020-resolved per  patient    . Chronic pain     sec. to arthritis-hips   . 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 Constipation    . COVID 07/2019   . Depression    . Diabetes mellitus (CMS [place])    . Diabetes mellitus, type 2 (CMS HCC)    .  Diarrhea    . Dyspnea on exertion    . ESRD (end stage renal disease) (CMS [place])    . Essential hypertension    . Exercise intolerance    . Fatty liver    . Fluid overload 08/21/21    Recent hospitalization for fluid overload, HD initiated 10/18/20   . Headache(784.0)    . Hemodialysis patient (CMS [place])     HD Monday-Wednesday-Friday   . 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 History of nephrectomy 03/24/2020    L nephrectomy   . Hx of echocardiogram 10/18/2020    Technically difficult d/t pt. body habitus; Left vent. EF est. 55-60%; mildy calcified aortic vlave leaflets-especially right coroanry cusp; Small mobile density on aortic valve-no aortic valve stenosis or regurg; Concentric remodeling; mild MR; Trace AR; Trileaflet aortic vlave; Nl IVC size wiht <50% insp. collapse;      Marland Kitchen Hyperlipidemia    . Hyperparathyroidism (CMS [place])    . Hyperthyroidism     Old records states hyperthyroidism, patient denies, states has hyperparathyroidism   . Irritable bowel syndrome    . Macular edema    . Morbid obesity (CMS [place])    . Obesity    . Panic attack    . Peripheral edema     ankles/feet   . Peripheral neuropathy     bilateral feet   . Pneumonia 07/2019; 09/2020    COVID Pneumonia 07/2019; fluid overload/pneumonia with recent hosp. 09/2020   . Shortness of breath     SOB after climbing 8 steps    . Type 2 diabetes mellitus (CMS HCC)    . Wears glasses     reading   . White coat hypertension            Past Surgical History:   Procedure Laterality Date   . AV FISTULA PLACEMENT Left 08/11/2020    LUE brachio cephalic fistula creation   . ECTOPIC PREGNANCY SURGERY  05/24/1998   . HX CATARACT REMOVAL Bilateral 2016   . HX CHOLECYSTECTOMY  09/10/96   . HX HAND  SURGERY Right 2017    Trigger finger release 3rd finger   . HX OTHER Right 10/18/2020    tunneled dialysis catheter   . HX PELVIC LAPAROSCOPY  05/24/98    Ectopic pregnancy   . HX SHOULDER SURGERY Left 2017    Procedure for frozen shoulder   . HX TUBAL LIGATION  09/10/2012    [facility]ips   . HYMENECTOMY  1996   . KIDNEY SURGERY  10/24/17; 03/24/20   . LAPAROSCOPIC NEPHRECTOMY, HAND ASSISTED Left 03/24/2020    Radical   . LAPAROSCOPIC PARTIAL NEPHRECTOMY Left 10/24/2017    hand-assisted   . RENAL BIOPSY, PERCUTANEOUS  03/08/2020    clear cell renal cell carcinoma           Medications Prior to Admission     Prescriptions    albuterol sulfate (PROVENTIL OR VENTOLIN OR PROAIR) 90 mcg/actuation Inhalation HFA Aerosol Inhaler    Take 2 Puffs by inhalation Every 4 hours as needed    ALPRAZolam (XANAX) 0.25 mg Oral Tablet    Take 0.25 mg by mouth Once per day as needed for Anxiety    atorvastatin (LIPITOR) 20 mg Oral Tablet    Take 20 mg by mouth Every evening    azithromycin (ZITHROMAX Z-PAK ORAL)    Take by mouth    cyclobenzaprine (FLEXERIL) 5 mg Oral Tablet  Take 5 mg by mouth Three times a day as needed for Muscle spasms    escitalopram oxalate (LEXAPRO) 10 mg Oral Tablet    Take 10 mg by mouth Every night    Fish Oil-Omega-3 Fatty Acids 360-1,200 mg Oral Capsule    Take 2 Caps by mouth Twice daily    fluticasone (FLONASE) 50 mcg/actuation Nasal Spray, Suspension    Administer 1 Spray into each nostril Once per day as needed (Allergy symptoms (nasal congestion/ runny nose))    JANUVIA 50 mg Oral Tablet    Take 50 mg by mouth Every morning    labetaloL (NORMODYNE) 200 mg Oral Tablet    Take 1 Tablet (200 mg total) by mouth Every 8 hours for 30 days Indications: Hold for SBP<120 or HR<55    loperamide (IMODIUM) 2 mg Oral Capsule    Take 4 mg by mouth Every 6 hours as needed (Diarrhea)    loratadine (CLARITIN) 10 mg Oral Tablet    Take 10 mg by mouth Once per day as needed (Allergies)    losartan (COZAAR) 25 mg Oral  Tablet    Take 1 Tablet (25 mg total) by mouth Once a day for 30 days    Patient taking differently:  Take 1 Tablet (25 mg total) by mouth Take in the evening on non-dialysis days (Sun/Tues/Thurs/Sat); does not take on dialysis days    traMADoL (ULTRAM) 50 mg Oral Tablet    Take 1 Tablet (50 mg total) by mouth Every 6 hours as needed for Pain    vitamin E 100 unit Oral Capsule    Take 100 Units by mouth Twice daily        acetaminophen (TYLENOL) tablet, 650 mg, Oral, Q6H PRN  albuterol 90 mcg per inhalation oral inhaler - "Respiratory to administer", 1 Puff, Inhalation, Q6H PRN  ALPRAZolam (XANAX) tablet, 0.25 mg, Oral, Daily PRN  atorvastatin (LIPITOR) tablet, 20 mg, Oral, QPM  [START ON 09/12/2021] cefTRIAXone (ROCEPHIN) 2 g in NS 50 mL IVPB minibag, 2 g, Intravenous, Q24H  cyclobenzaprine (FLEXERIL) tablet, 5 mg, Oral, 3x/day PRN  [START ON 09/12/2021] doxycycline tablet, 100 mg, Oral, 2x/day  enoxaparin (LOVENOX) 30 mg/0.3 mL SubQ injection, 30 mg, Subcutaneous, Daily  escitalopram (LEXAPRO) tablet, 10 mg, Oral, NIGHTLY  fluticasone (FLONASE) 50 mcg per spray nasal spray, 1 Spray, Each Nostril, Daily PRN  labetalol (NORMODYNE) tablet, 200 mg, Oral, Q8HRS  linagliptin (TRADJENTA) tablet, 5 mg, Oral, Daily  loratadine (CLARITIN) tablet, 10 mg, Oral, Daily PRN  losartan (COZAAR) tablet, 25 mg, Oral, Daily  morphine 4 mg/mL injection, 4 mg, Intravenous, Q5 Min PRN  nitroGLYCERIN (NITROSTAT) sublingual tablet, 0.4 mg, Sublingual, Q5 Min PRN  NS 250 mL flush bag, , Intravenous, Q1H PRN  NS flush syringe, 10 mL, Intravenous, Q8H  NS flush syringe, 10 mL, Intravenous, Q8HRS  NS flush syringe, 10 mL, Intravenous, Q1H PRN  ondansetron (ZOFRAN) 2 mg/mL injection, 4 mg, Intravenous, Q8H PRN  traMADol (ULTRAM) tablet, 50 mg, Oral, Q6H PRN        Allergies   Allergen Reactions   . Hydrocodone    . Ibuprofen        Social History     Tobacco Use   . Smoking status: Never   . Smokeless tobacco: Never   Substance Use Topics   .  Alcohol use: No       Family Medical History:     Problem Relation (Age of Onset)    Breast Cancer Paternal Aunt  Cancer Maternal Grandmother    Diabetes Father, Paternal Aunt    High Cholesterol Maternal Uncle, Mother    Hypertension (High Blood Pressure) Maternal Uncle, Father            ROS: Negative except as above    DNR Status:  Full code    EXAM:      Temperature: 37 C (98.6 F)  Heart Rate: (!) 110  BP (Non-Invasive): (!) 163/67  Respiratory Rate: 17  SpO2: 96 %  General: appears obese. No distress.   Eyes: Pupils equal and round, reactive to light and accomodation.   HEENT: Head atraumatic and normocephalic   Neck: No JVD or thyromegaly or lymphadenopathy   Lungs:  Crackles bilateral bases.   Cardiovascular: regular rate and rhythm, S1, S2 normal, no murmur  Abdomen: Soft, non-tender, Bowel sounds normal  Extremities: extremities normal, atraumatic, no cyanosis or edema   Skin: Skin warm and dry   Neurologic: Grossly normal   Lymphatics: No lymphadenopathy   Psychiatric: Normal affect, behavior,         Labs:    Lab Results for Last 24 Hours:    Results for orders placed or performed during the hospital encounter of 09/11/21 (from the past 24 hour(s))   COVID-19, FLU A/B, RSV RAPID BY PCR   Result Value Ref Range    SARS-CoV-2 Not Detected Not Detected    INFLUENZA VIRUS TYPE A Not Detected Not Detected    INFLUENZA VIRUS TYPE B Not Detected Not Detected    RESPIRATORY SYNCTIAL VIRUS (RSV) Not Detected Not Detected   COMPREHENSIVE METABOLIC PANEL, NON-FASTING   Result Value Ref Range    SODIUM 136 136 - 145 mmol/L    POTASSIUM 4.1 3.5 - 5.1 mmol/L    CHLORIDE 101 96 - 111 mmol/L    CO2 TOTAL 20 (L) 22 - 30 mmol/L    ANION GAP 15 (H) 4 - 13 mmol/L    BUN 32 (H) 8 - 25 mg/dL    CREATININE 6.15 (H) 0.60 - 1.05 mg/dL    BUN/CREA RATIO 5 (L) 6 - 22    ESTIMATED GFR 8 (L) >=60 mL/min/BSA    ALBUMIN 2.9 (L) 3.5 - 5.0 g/dL     CALCIUM 9.4 8.5 - 10.0 mg/dL    GLUCOSE 172 (H) 65 - 125 mg/dL    ALKALINE PHOSPHATASE  65 50 - 130 U/L    ALT (SGPT) 21 8 - 22 U/L    AST (SGOT)  19 8 - 45 U/L    BILIRUBIN TOTAL 0.7 0.3 - 1.3 mg/dL    PROTEIN TOTAL 6.9 6.4 - 8.3 g/dL   TROPONIN-I   Result Value Ref Range    TROPONIN I 1,424 (HH) <=30 ng/L   CBC WITH DIFF   Result Value Ref Range    WBC 9.4 3.7 - 11.0 x10^3/uL    RBC 2.95 (L) 3.85 - 5.22 x10^6/uL    HGB 10.0 (L) 11.5 - 16.0 g/dL    HCT 31.0 (L) 34.8 - 46.0 %    MCV 105.1 (H) 78.0 - 100.0 fL    MCH 33.9 (H) 26.0 - 32.0 pg    MCHC 32.3 31.0 - 35.5 g/dL    RDW-CV 15.8 (H) 11.5 - 15.5 %    PLATELETS 207 150 - 400 x10^3/uL    MPV 10.3 8.7 - 12.5 fL    NEUTROPHIL % 90 %    LYMPHOCYTE % 5 %    MONOCYTE % 4 %  EOSINOPHIL % 0 %    BASOPHIL % 0 %    NEUTROPHIL # 8.38 (H) 1.50 - 7.70 x10^3/uL    LYMPHOCYTE # 0.50 (L) 1.00 - 4.80 x10^3/uL    MONOCYTE # 0.41 0.20 - 1.10 x10^3/uL    EOSINOPHIL # <0.10 <=0.50 x10^3/uL    BASOPHIL # <0.10 <=0.20 x10^3/uL    IMMATURE GRANULOCYTE % 1 0 - 1 %    IMMATURE GRANULOCYTE # <0.10 <0.10 x10^3/uL       Imaging Studies:    XR AP MOBILE CHEST (If patient condition warrants)    Result Date: 09/11/2021  [facility] Female, 55 years old. XR AP MOBILE CHEST performed on 09/11/2021 4:38 AM. REASON FOR EXAM:  Chest pain TECHNIQUE: 1 views/1 images submitted for interpretation. COMPARISON:  05/25/2021 FINDINGS:  There is a shallow inspiration. The heart size is unchanged. There is central vascular congestion and mild interstitial prominence. Right greater than left bibasilar opacity is identified. No definite pleural effusion or pneumothorax is identified. Included portions of the upper abdomen are unremarkable.     Mild central vascular congestion and interstitial prominence with right greater than left bibasilar opacities, concerning for an infectious process in the appropriate clinical setting. Radiologist location ID: [id number]         ASSESSMENT & PLAN     Community-acquired pneumonia  Chest x-ray with central congestion and interstitial prominence  Continue  with ceftriaxone, doxycycline  DC antibiotics if respiratory status rapidly improves after dialysis    Volume overload secondary to missing dialysis sessions   ESRD on HD, history of renal cell cancer status post unilateral nephrectomy   Chest x-ray with central congestion and interstitial prominence, Electrolytes level currently within normal limits  Consulted nephrology    History of hypertension-continue with home meds  History of diabetes-continue with home meds    Diet:  Regular  Dispo:  Once medically optimized  Capacity:  Has capacity  DVT prophylaxis. Lovenox SC    Code status: Full code    Creta Levin, MD  [facility]        Portions of this note may be dictated using voice recognition software or a dictation service. Variances in spelling and vocabulary are possible and unintentional. Not all errors are caught/corrected. Please notify the [facility] if any discrepancies are noted or if the meaning of any statement is not clear.

## 2021-09-11 NOTE — ACP (Advance Care Planning) (Signed)
[facility]    Advanced Care Planning (ACP) Note    [name], [name]  MRN: [id number]   Date of Admission:  09/11/2021  Date of Birth:  01/08/1966  Date of Service:  09/11/2021    Purpose of Encounter: To discuss patient's current medical conditions, overall prognosis, CODE STATUS, as well as goals of care.     Parties in attendance: Patient, family and myself    Decisional capacity: Patient has FULL capacity    Conditions/Objective Findings:  Active Hospital Problems    Diagnosis   . Primary Problem: Volume overload   . ESRD on dialysis (CMS HCC)   . Essential hypertension   . Type 2 diabetes mellitus (CMS HCC)       Discussion:  Patient's current conditions were discussed with overall prognosis as well as treatment plan.  Family expressed understanding, and questions were answered.  Patient's code status was also discussed, the meaning of code status and advanced directive was discussed along with what CPR as well as intubation/mechanical ventilation means.    Plan:  See H&P/progress note for detailed treatment plan.    CODE STATUS: Full code  Surrogate: husband - Quillian Quince, daughter [name]    Time spent: > 18 minutes    ACP documents: N/A      Portions of this note may be dictated using voice recognition software or a dictation service. Variances in spelling and vocabulary are possible and unintentional. Not all errors are caught/corrected. Please notify the [facility] if any discrepancies are noted or if the meaning of any statement is not clear.     Creta Levin, MD

## 2021-09-11 NOTE — ED Nurses Note (Signed)
Up to BR.  Patient dyspneic on exertion; O2 sat maintained in upper 90's.

## 2021-09-11 NOTE — Care Plan (Signed)
Problem: Adult Inpatient Plan of Care  Goal: Plan of Care Review  Outcome: Ongoing (see interventions/notes)  Goal: Patient-Specific Goal (Individualized)  Outcome: Ongoing (see interventions/notes)  Flowsheets (Taken 09/11/2021 1027)  Individualized Care Needs: anxiety  Anxieties, Fears or Concerns: anxiety, pain  Goal: Absence of Hospital-Acquired Illness or Injury  Outcome: Ongoing (see interventions/notes)  Goal: Optimal Comfort and Wellbeing  Outcome: Ongoing (see interventions/notes)

## 2021-09-11 NOTE — ED Nurses Note (Signed)
Report given to Cristie Hem RN at this time. All questions answered sufficiently.

## 2021-09-11 NOTE — ED Provider Notes (Addendum)
Sampson Si, MD  [facility]  Emergency Department Visit Note      Chief Complaint:  Shortness of breath    HISTORY OF PRESENT ILLNESS     [name], date of birth 08/01/1966, is a 55 y.o.female who presents to the Emergency Department on 09/11/2021 with shortness of breath. The patient reports shortness of breath, cough and chest tightness for the last week, but worsening over the last two days. The patient reports a history of pneumonia when she had COVID in November 2020. The patient states she also hasn't been going to her dialysis due to hey symptoms which is normally on Tuesday, Thursday and Sunday. The patient reports she still makes urine. The patient was recently seen on 08/28/2021 for C. Diff and reports it normally happens, which she was taking Augmentin at the time of her last visit. The patient denies hemoptysis, diarrhea, nausea, vomiting, fever and chills.    REVIEW OF SYSTEMS     The pertinent positive and negative symptoms are as per HPI. All other systems reviewed and are negative.     PATIENT HISTORY     Past Medical History:  Past Medical History:   Diagnosis Date   . Anxiety    . Arthritis     hips   . Beta blocker prescribed for left ventricular systolic dysfunction     Labetalol    . Cancer (CMS [place]) 2019, 2021    kidney, Clear Cell Cancer left kidney-S/P partial nephrectomy 2019, then radical nephrectomy 2021   . Cataract     S/P cataract extraction bilaterally    . Cellulitis 10/24/14    left foot   . Cellulitis 10/21/2020    LLE extremity 09/2020-resolved per patient    . Chronic pain     sec. to arthritis-hips   . 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 Constipation    . COVID 07/2019   . Depression    . Diabetes mellitus (CMS [place])    . Diabetes mellitus, type 2 (CMS HCC)    . Diarrhea    . Dyspnea on exertion    . ESRD (end stage renal disease) (CMS [place])    . Essential hypertension    . Exercise intolerance    . Fatty liver    . Fluid overload  08/21/21    Recent hospitalization for fluid overload, HD initiated 10/18/20   . Headache(784.0)    . Hemodialysis patient (CMS [place])     HD Monday-Wednesday-Friday   . 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 History of nephrectomy 03/24/2020    L nephrectomy   . Hx of echocardiogram 10/18/2020    Technically difficult d/t pt. body habitus; Left vent. EF est. 55-60%; mildy calcified aortic vlave leaflets-especially right coroanry cusp; Small mobile density on aortic valve-no aortic valve stenosis or regurg; Concentric remodeling; mild MR; Trace AR; Trileaflet aortic vlave; Nl IVC size wiht <50% insp. collapse;      Marland Kitchen Hyperlipidemia    . Hyperparathyroidism (CMS [place])    . Hyperthyroidism     Old records states hyperthyroidism, patient denies, states has hyperparathyroidism   . Irritable bowel syndrome    . Macular edema    . Morbid obesity (CMS [place])    . Obesity    . Panic attack    . Peripheral edema     ankles/feet   . Peripheral neuropathy     bilateral  feet   . Pneumonia 07/2019; 09/2020    COVID Pneumonia 07/2019; fluid overload/pneumonia with recent hosp. 09/2020   . Shortness of breath     SOB after climbing 8 steps    . Type 2 diabetes mellitus (CMS HCC)    . Wears glasses     reading   . White coat hypertension        Past Surgical History:  Past Surgical History:   Procedure Laterality Date   . Av fistula placement Left 08/11/2020   . Ectopic pregnancy surgery  05/24/1998   . Hx cataract removal Bilateral 2016   . Hx cholecystectomy  09/10/96   . Hx hand surgery Right 2017   . Hx other Right 10/18/2020   . Hx pelvic laparoscopy  05/24/98   . Hx shoulder surgery Left 2017   . Hx tubal ligation  09/10/2012   . Hymenectomy  1996   . Kidney surgery  10/24/17; 03/24/20   . Laparoscopic nephrectomy, hand assisted Left 03/24/2020   . Laparoscopic partial nephrectomy Left 10/24/2017   . Renal biopsy, percutaneous  03/08/2020       Family History:  Family Medical History:     Problem Relation  (Age of Onset)    Breast Cancer Paternal Aunt    Cancer Maternal Grandmother    Diabetes Father, Paternal Aunt    High Cholesterol Maternal Uncle, Mother    Hypertension (High Blood Pressure) Maternal Uncle, Father            Social History:  Social History     Tobacco Use   . Smoking status: Never   . Smokeless tobacco: Never   Vaping Use   . Vaping Use: Never used   Substance Use Topics   . Alcohol use: No   . Drug use: No       Medications:  Current Outpatient Medications   Medication Sig   . albuterol sulfate (PROVENTIL OR VENTOLIN OR PROAIR) 90 mcg/actuation Inhalation HFA Aerosol Inhaler Take 2 Puffs by inhalation Every 4 hours as needed   . ALPRAZolam (XANAX) 0.25 mg Oral Tablet Take 0.25 mg by mouth Once per day as needed for Anxiety   . atorvastatin (LIPITOR) 20 mg Oral Tablet Take 20 mg by mouth Every evening   . azithromycin (ZITHROMAX Z-PAK ORAL) Take by mouth   . cyclobenzaprine (FLEXERIL) 5 mg Oral Tablet Take 5 mg by mouth Three times a day as needed for Muscle spasms   . escitalopram oxalate (LEXAPRO) 10 mg Oral Tablet Take 10 mg by mouth Every night   . Fish Oil-Omega-3 Fatty Acids 360-1,200 mg Oral Capsule Take 2 Caps by mouth Twice daily   . fluticasone (FLONASE) 50 mcg/actuation Nasal Spray, Suspension Administer 1 Spray into each nostril Once per day as needed (Allergy symptoms (nasal congestion/ runny nose))   . JANUVIA 50 mg Oral Tablet Take 50 mg by mouth Every morning   . labetaloL (NORMODYNE) 200 mg Oral Tablet Take 1 Tablet (200 mg total) by mouth Every 8 hours for 30 days Indications: Hold for SBP<120 or HR<55   . loperamide (IMODIUM) 2 mg Oral Capsule Take 4 mg by mouth Every 6 hours as needed (Diarrhea)   . loratadine (CLARITIN) 10 mg Oral Tablet Take 10 mg by mouth Once per day as needed (Allergies)   . losartan (COZAAR) 25 mg Oral Tablet Take 1 Tablet (25 mg total) by mouth Once a day for 30 days (Patient taking differently: Take 1 Tablet (25 mg total)  by mouth Take in the evening on  non-dialysis days (Sun/Tues/Thurs/Sat); does not take on dialysis days)   . traMADoL (ULTRAM) 50 mg Oral Tablet Take 1 Tablet (50 mg total) by mouth Every 6 hours as needed for Pain   . vitamin E 100 unit Oral Capsule Take 100 Units by mouth Twice daily       Allergies:  Allergies   Allergen Reactions   . Hydrocodone    . Ibuprofen        PHYSICAL EXAM     Vitals:  ED Triage Vitals [09/11/21 0412]   BP (Non-Invasive) (!) 179/98   Heart Rate (!) 125   Respiratory Rate (!) 22   Temperature 37 C (98.6 F)   SpO2 94 %   Weight 115 kg (254 lb 3.2 oz)   Height 1.702 m (5\' 7" )       Constitutional: The patient is alert and oriented to person, place, and time. Well-developed and well-nourished.  HENT: Atraumatic, normocephalic head. Mucous membranes moist. TM's clear, Nares unremarkable. Oropharynx shows no erythema or exudate.   Eyes: Pupils equal and round, reactive to light. No scleral icterus. Normal conjunctiva. Extraocular movements are intact.  Neck: Supple, non-tender, no nuchal rigidity, no adenopathy.   Lungs: Diffuse polyphonic wheezing with nonproductive cough.  Symmetric and equal expansion. No respiratory distress or retractions.  Cardiovascular: Heart is S1-S2 regular rate and regular rhythm without murmur click or rub.  Abdomen:  Soft, non-distended. No tenderness to palpation without evidence of rebound or guarding. No pulsatile masses. No organomegaly.   Genitourinary: No CVA tenderness.  Extremities: Full range of motion, no clubbing, cyanosis, or edema. Pulses 2+, capillary refill <2 seconds.  Spine: No midline or paraspinal muscle tenderness to palpation. No step-off.   Skin: Warm and dry. No cyanosis, jaundice, rash or lesion.  Neurologic: Alert and oriented x3. Normal facial symmetry and speech, Normal upper and lower extremity strength, and grossly normal sensation.     DIFFERENTIAL DIAGNOSES     1. MI  2. Angina  3. Pneumonia   4. CHF     DIAGNOSTIC STUDIES     Labs:    Results for orders placed or  performed during the hospital encounter of 09/11/21   COMPREHENSIVE METABOLIC PANEL, NON-FASTING   Result Value Ref Range    SODIUM 136 136 - 145 mmol/L    POTASSIUM 4.1 3.5 - 5.1 mmol/L    CHLORIDE 101 96 - 111 mmol/L    CO2 TOTAL 20 (L) 22 - 30 mmol/L    ANION GAP 15 (H) 4 - 13 mmol/L    BUN 32 (H) 8 - 25 mg/dL    CREATININE 6.15 (H) 0.60 - 1.05 mg/dL    BUN/CREA RATIO 5 (L) 6 - 22    ESTIMATED GFR 8 (L) >=60 mL/min/BSA    ALBUMIN 2.9 (L) 3.5 - 5.0 g/dL     CALCIUM 9.4 8.5 - 10.0 mg/dL    GLUCOSE 172 (H) 65 - 125 mg/dL    ALKALINE PHOSPHATASE 65 50 - 130 U/L    ALT (SGPT) 21 8 - 22 U/L    AST (SGOT)  19 8 - 45 U/L    BILIRUBIN TOTAL 0.7 0.3 - 1.3 mg/dL    PROTEIN TOTAL 6.9 6.4 - 8.3 g/dL   TROPONIN-I   Result Value Ref Range    TROPONIN I 1,424 (HH) <=30 ng/L   COVID-19, FLU A/B, RSV RAPID BY PCR   Result Value Ref Range  SARS-CoV-2 Not Detected Not Detected    INFLUENZA VIRUS TYPE A Not Detected Not Detected    INFLUENZA VIRUS TYPE B Not Detected Not Detected    RESPIRATORY SYNCTIAL VIRUS (RSV) Not Detected Not Detected   CBC WITH DIFF   Result Value Ref Range    WBC 9.4 3.7 - 11.0 x10^3/uL    RBC 2.95 (L) 3.85 - 5.22 x10^6/uL    HGB 10.0 (L) 11.5 - 16.0 g/dL    HCT 31.0 (L) 34.8 - 46.0 %    MCV 105.1 (H) 78.0 - 100.0 fL    MCH 33.9 (H) 26.0 - 32.0 pg    MCHC 32.3 31.0 - 35.5 g/dL    RDW-CV 15.8 (H) 11.5 - 15.5 %    PLATELETS 207 150 - 400 x10^3/uL    MPV 10.3 8.7 - 12.5 fL    NEUTROPHIL % 90 %    LYMPHOCYTE % 5 %    MONOCYTE % 4 %    EOSINOPHIL % 0 %    BASOPHIL % 0 %    NEUTROPHIL # 8.38 (H) 1.50 - 7.70 x10^3/uL    LYMPHOCYTE # 0.50 (L) 1.00 - 4.80 x10^3/uL    MONOCYTE # 0.41 0.20 - 1.10 x10^3/uL    EOSINOPHIL # <0.10 <=0.50 x10^3/uL    BASOPHIL # <0.10 <=0.20 x10^3/uL    IMMATURE GRANULOCYTE % 1 0 - 1 %    IMMATURE GRANULOCYTE # <0.10 <0.10 x10^3/uL     BLOOD CULTURE - [facility] ONLY - results pending at time of disposition  BLOOD CULTURE - [place] - results pending at time of disposition     Labs reviewed and  interpreted by me.    Radiology:    XR AP MOBILE CHEST (If patient condition warrants)   Final Result   Mild central vascular congestion and interstitial prominence with right greater than left bibasilar opacities, concerning for an infectious process in the appropriate clinical setting.         Radiologist location ID: [id number]           Radiological imaging interpreted by radiologist. Report reviewed by me.    EKG:  12 lead EKG interpreted by me shows sinus tachycardia, rate of 119 bpm, nonspecific ST abnormalities.    ED PROGRESS NOTE / [place] records reviewed by me:  I have reviewed the nurse's notes. I have reviewed the patient's problem list and pertinent past medical records.    Orders Placed This Encounter   . ADULT ROUTINE BLOOD CULTURE, SET OF 2 BOTTLES (BACTERIA AND YEAST)   . ADULT ROUTINE BLOOD CULTURE, SET OF 2 BOTTLES (BACTERIA AND YEAST)   . XR AP MOBILE CHEST (If patient condition warrants)   . COMPREHENSIVE METABOLIC PANEL, NON-FASTING   . TROPONIN-I   . COVID-19, FLU A/B, RSV RAPID BY PCR   . CBC WITH DIFF   . ECG 12-LEAD   . INSERT & MAINTAIN PERIPHERAL IV ACCESS   . NS flush syringe       0548: Initial evaluation is complete at this time. I discussed with the patient that I would order blood cultures and page the hospitalist to further evaluate. Patient is agreeable with the treatment plan at this time.    0600: Paging Hospitalist.    [phone]: I discussed the patient's case and above findings with Dr. Wilnette Kales [facility]) who is agreeable to the treatment plan and is arranging for admission at this time.     MIPS  Not applicable     OPIATE PRESCRIPTION       Not applicable    CORE MEASURES      Not applicable    CRITICAL CARE TIME      Total Critical Care Time for this patient is 45 minutes and it is exclusive of procedural time.    PRE-DISPOSITION VITALS      Pre-Disposition Vitals:  Filed Vitals:    09/11/21 0412 09/11/21 0445 09/11/21 0500 09/11/21 0530   BP: (!)  179/98 (!) 182/94 (!) 183/84 (!) 176/81   Pulse: (!) 125 (!) 117 (!) 113 (!) 112   Resp: (!) 22 (!) 40 (!) 26 (!) 23   Temp: 37 C (98.6 F)      SpO2: 94% 95% 94% 94%       CLINICAL IMPRESSION     Encounter Diagnoses   Name Primary?   . Elevated troponin I level Yes   . Hypervolemia, unspecified hypervolemia type    . ESRD on dialysis (CMS HCC)    . Pneumonia    . Shortness of breath      DISPOSITION/PLAN     Admitted      Condition at Disposition: [place], SCRIBE scribed for Malachy Chamber, MD.     Documentation assistance provided for Malachy Chamber, MD by Kelby Aline, SCRIBE. Information recorded by the scribe was done at my direction and has been reviewed and validated by me Malachy Chamber, MD.

## 2021-09-11 NOTE — Consults (Signed)
[facility]  NEPHROLOGY CONSULT    DATE: 09/11/2021  REASON FOR CONSULTATION: ESRD        Assessment  [name] is a 55 y.o. year old female with ESRD on HD MWF, diabetes, hypertension who was admitted to observation with shortness of breath and cough on 09/11/2021.    # ESRD on HD. typically on a MWF schedule.  Reports that her last dialysis was last Wednesday though missed her session on 12/16.    # Hypertension.     # Electrolytes. Appropriate    # Shortness of breath.  Notably on room air at the time of my evaluation.  Likely multifactorial with suspected pneumonia as primary cause.  Although she missed her last dialysis session, she does not strike me as particularly volume overloaded today.    Plan  1. Although I do not think there is any urgent indication for dialysis today as her electrolytes are controlled and she is on room air, I did offer the patient a session of dialysis see if it would help with her dyspnea.  She declined dialysis for today.  2. Antibiotics per primary team.  3. Next dialysis tomorrow    Dr. Judeth Horn to assume nephrology care starting tomorrow.    Cleaster Corin, MD        HPI: Patient is a 55 year old female with ESRD on HD MWF who was admitted to observation on 09/11/2021 with worsening cough, shortness of breath, and weakness.  She had symptoms about 1 week ago which have progressively worsened.  She missed her last session of dialysis on 12/16.  In the emergency department, she was hypertensive with metabolic panel not showing any acute abnormalities.  Chest x-ray was done which showed mild vascular congestion and interstitial prominence concerning for infectious process.    The patient denies any nausea, vomiting, or diarrhea.  She still makes urine and notes that she has not had any development of edema.    REVIEW OF SYSTEMS:   As per HPI, otherwise negative  Past Medical History:   Diagnosis Date   . Anxiety    . Arthritis     hips   . Beta blocker prescribed for left  ventricular systolic dysfunction     Labetalol    . Cancer (CMS [place]) 2019, 2021    kidney, Clear Cell Cancer left kidney-S/P partial nephrectomy 2019, then radical nephrectomy 2021   . Cataract     S/P cataract extraction bilaterally    . Cellulitis 10/24/14    left foot   . Cellulitis 10/21/2020    LLE extremity 09/2020-resolved per patient    . Chronic pain     sec. to arthritis-hips   . 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 Constipation    . COVID 07/2019   . Depression    . Diabetes mellitus (CMS [place])    . Diabetes mellitus, type 2 (CMS HCC)    . Diarrhea    . Dyspnea on exertion    . ESRD (end stage renal disease) (CMS [place])    . Essential hypertension    . Exercise intolerance    . Fatty liver    . Fluid overload 08/21/21    Recent hospitalization for fluid overload, HD initiated 10/18/20   . Headache(784.0)    . Hemodialysis patient (CMS [place])     HD Monday-Wednesday-Friday   . 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 History  of nephrectomy 03/24/2020    L nephrectomy   . Hx of echocardiogram 10/18/2020    Technically difficult d/t pt. body habitus; Left vent. EF est. 55-60%; mildy calcified aortic vlave leaflets-especially right coroanry cusp; Small mobile density on aortic valve-no aortic valve stenosis or regurg; Concentric remodeling; mild MR; Trace AR; Trileaflet aortic vlave; Nl IVC size wiht <50% insp. collapse;      Marland Kitchen Hyperlipidemia    . Hyperparathyroidism (CMS [place])    . Hyperthyroidism     Old records states hyperthyroidism, patient denies, states has hyperparathyroidism   . Irritable bowel syndrome    . Macular edema    . Morbid obesity (CMS [place])    . Obesity    . Panic attack    . Peripheral edema     ankles/feet   . Peripheral neuropathy     bilateral feet   . Pneumonia 07/2019; 09/2020    COVID Pneumonia 07/2019; fluid overload/pneumonia with recent hosp. 09/2020   . Shortness of breath     SOB after climbing 8 steps    . Type 2 diabetes mellitus (CMS  [facility])    . Wears glasses     reading   . White coat hypertension            Social History     Tobacco Use   . Smoking status: Never   . Smokeless tobacco: Never   Vaping Use   . Vaping Use: Never used   Substance Use Topics   . Alcohol use: No   . Drug use: No       Family Medical History:     Problem Relation (Age of Onset)    Breast Cancer Paternal Aunt    Cancer Maternal Grandmother    Diabetes Father, Paternal Aunt    High Cholesterol Maternal Uncle, Mother    Hypertension (High Blood Pressure) Maternal Uncle, Father            Current Outpatient Medications   Medication Instructions   . albuterol sulfate (PROVENTIL OR VENTOLIN OR PROAIR) 90 mcg/actuation Inhalation HFA Aerosol Inhaler 2 Puffs, Inhalation, EVERY 4 HOURS PRN   . ALPRAZolam (XANAX) 0.25 mg, Oral, DAILY PRN   . atorvastatin (LIPITOR) 20 mg, Oral, EVERY EVENING   . azithromycin (ZITHROMAX Z-PAK ORAL) Oral   . cyclobenzaprine (FLEXERIL) 5 mg, Oral, 3 TIMES DAILY PRN   . escitalopram oxalate (LEXAPRO) 10 mg, Oral, NIGHTLY   . Fish Oil-Omega-3 Fatty Acids 360-1,200 mg Oral Capsule 2 Capsules, Oral, 2 TIMES DAILY   . fluticasone (FLONASE) 50 mcg/actuation Nasal Spray, Suspension 1 Spray, Each Nostril, DAILY PRN   . Januvia 50 mg, Oral, EVERY MORNING   . labetaloL (NORMODYNE) 200 mg, Oral, EVERY 8 HOURS (SCHEDULED)   . loperamide (IMODIUM) 4 mg, Oral, EVERY 6 HOURS PRN   . loratadine (CLARITIN) 10 mg, Oral, DAILY PRN   . losartan (COZAAR) 25 mg, Oral, DAILY   . traMADoL (ULTRAM) 50 mg, Oral, EVERY 6 HOURS PRN   . vitamin E 100 Units, Oral, 2 TIMES DAILY         Current Facility-Administered Medications:   .  acetaminophen (TYLENOL) tablet, 650 mg, Oral, Q6H PRN, Algis Downs, Hemanth, MD  .  albuterol 90 mcg per inhalation oral inhaler - "Respiratory to administer", 1 Puff, Inhalation, Q6H PRN, Creta Levin, MD  .  ALPRAZolam Duanne Moron) tablet, 0.25 mg, Oral, Daily PRN, Creta Levin, MD  .  atorvastatin (LIPITOR) tablet, 20 mg, Oral, QPM, Creta Levin,  MD  .  [  START ON 09/12/2021] cefTRIAXone (ROCEPHIN) 2 g in NS 50 mL IVPB minibag, 2 g, Intravenous, Q24H, Pillai, Hemanth, MD  .  cyclobenzaprine (FLEXERIL) tablet, 5 mg, Oral, 3x/day PRN, Creta Levin, MD  .  Derrill Memo ON 09/12/2021] doxycycline tablet, 100 mg, Oral, 2x/day, Pillai, Hemanth, MD  .  enoxaparin (LOVENOX) 30 mg/0.3 mL SubQ injection, 30 mg, Subcutaneous, Daily, Creta Levin, MD, 30 mg at 09/11/21 1004  .  escitalopram (LEXAPRO) tablet, 10 mg, Oral, NIGHTLY, Pillai, Hemanth, MD  .  fluticasone (FLONASE) 50 mcg per spray nasal spray, 1 Spray, Each Nostril, Daily PRN, Creta Levin, MD  .  labetalol (NORMODYNE) tablet, 200 mg, Oral, Q8HRS, Creta Levin, MD  .  linagliptin (TRADJENTA) tablet, 5 mg, Oral, Daily, Creta Levin, MD, 5 mg at 09/11/21 1004  .  loratadine (CLARITIN) tablet, 10 mg, Oral, Daily PRN, Creta Levin, MD  .  losartan (COZAAR) tablet, 25 mg, Oral, Daily, Creta Levin, MD, 25 mg at 09/11/21 1004  .  morphine 4 mg/mL injection, 4 mg, Intravenous, Q5 Min PRN, Pillai, Hemanth, MD  .  nitroGLYCERIN (NITROSTAT) sublingual tablet, 0.4 mg, Sublingual, Q5 Min PRN, Pillai, Hemanth, MD  .  NS 250 mL flush bag, , Intravenous, Q1H PRN, Algis Downs, Hemanth, MD  .  NS flush syringe, 10 mL, Intravenous, Q8H, Pillai, Hemanth, MD, 10 mL at 09/11/21 0600  .  NS flush syringe, 10 mL, Intravenous, Q8HRS, Creta Levin, MD, 10 mL at 09/11/21 1010  .  NS flush syringe, 10 mL, Intravenous, Q1H PRN, Algis Downs, Hemanth, MD  .  ondansetron (ZOFRAN) 2 mg/mL injection, 4 mg, Intravenous, Q8H PRN, Creta Levin, MD, 4 mg at 09/11/21 1009  .  traMADol (ULTRAM) tablet, 50 mg, Oral, Q6H PRN, Creta Levin, MD, 50 mg at 09/11/21 1056      PHYSICAL EXAM:  Filed Vitals:    09/11/21 0900 09/11/21 0915 09/11/21 1000 09/11/21 1037   BP: (!) 173/77 (!) 187/160 (!) 163/67 (!) 170/78   Pulse: (!) 110 (!) 108 (!) 110 (!) 107   Resp: (!) 39 (!) 36 17 18   Temp:    37.3 C (99.1 F)   SpO2: 93% 92% 96% 97%      General: mildly ill appearing  HEENT: Anicteric, no ocular discharge, mucous membranes moist.  Neck:  No apparent JVD.  Cardiac: Regular rate and rhythm.   Lungs: Bibasilar rales  Abdomen: Soft, non-tender to palpation  Extremities: No edema.  Neuro: Alert and oriented.  Skin: Warm and dry, no apparent rashes.  Psych: Mood and affect appropriate.   LUE AVF with good thrill    Lab Results   Component Value Date    SODIUM 136 09/11/2021    POTASSIUM 4.1 09/11/2021    CHLORIDE 101 09/11/2021    CO2 20 (L) 09/11/2021    ANIONGAP 15 (H) 09/11/2021    BUN 32 (H) 09/11/2021    CREATININE 6.15 (H) 09/11/2021    BUNCRRATIO 5 (L) 09/11/2021    GFR 8 (L) 09/11/2021    GLUCOSENF 172 (H) 09/11/2021       CBC  Diff   Lab Results   Component Value Date/Time    WBC 9.4 09/11/2021 04:30 AM    HGB 10.0 (L) 09/11/2021 04:30 AM    HCT 31.0 (L) 09/11/2021 04:30 AM    PLTCNT 207 09/11/2021 04:30 AM    SEDRATE 46 (H) 10/24/2014 10:13 AM    ESR 55 (H) 08/20/2019 02:43 PM    RBC 2.95 (L) 09/11/2021 04:30 AM  MCV 105.1 (H) 09/11/2021 04:30 AM    MCHC 32.3 09/11/2021 04:30 AM    MCH 33.9 (H) 09/11/2021 04:30 AM    RDW 14.2 03/31/2020 10:41 AM    MPV 10.3 09/11/2021 04:30 AM    Lab Results   Component Value Date/Time    PMNS 90 09/11/2021 04:30 AM    LYMPHOCYTES 13 (L) 03/31/2020 10:41 AM    EOSINOPHIL 1 03/31/2020 10:41 AM    MONOCYTES 4 09/11/2021 04:30 AM    BASOPHILS 0 09/11/2021 04:30 AM    BASOPHILS <0.10 09/11/2021 04:30 AM    PMNABS 8.38 (H) 09/11/2021 04:30 AM    LYMPHSABS 0.50 (L) 09/11/2021 04:30 AM    EOSABS <0.10 09/11/2021 04:30 AM    MONOSABS 0.41 09/11/2021 04:30 AM            Lab Results   Component Value Date/Time    COLOR Yellow 08/28/2021 02:42 PM    SPECGRAVUR 1.015 08/28/2021 02:42 PM    PHURINE 8.0 (H) 08/28/2021 02:42 PM    PROTEIN >= 300 (A) 08/28/2021 02:42 PM    GLUCOSE 100 (A) 08/28/2021 02:42 PM    KETONES Negative 08/28/2021 02:42 PM    UROBILINOGEN 0.2 08/28/2021 02:42 PM    LEUKOCYTES Negative 08/28/2021  02:42 PM    NITRITE Negative 08/28/2021 02:42 PM    WBC 9.4 09/11/2021 04:30 AM    RBC 2.95 (L) 09/11/2021 04:30 AM    BACTERIA None 08/28/2021 02:42 PM    MUCOUS Slight (A) 10/16/2020 09:13 PM    BILIRUBIN Negative 08/28/2021 02:42 PM    HGBURINE Negative 08/28/2021 02:42 PM    SQUAEPIT 2-5 (A) 08/28/2021 02:42 PM

## 2021-09-11 NOTE — ED Nurses Note (Signed)
To room 3.  Placed on cardiac monitor showing ST 110s without ectopy.

## 2021-09-11 NOTE — ED Nurses Note (Signed)
Attempted to call report at this time, to call back in approx 10 min

## 2021-09-11 NOTE — Nurses Notes (Signed)
POC reviewed with pt. A/Ox4. Denies pain/SOB; c/o same chest pain from admission. MD notified. Repeat trop ordered and was elevated. MD notified of elevated troponin. Pt states CP is much better after prn pain medicaiton and denies any other symptoms. BP improving as well. MD plan to repeat troponin in AM and most likely related to volume overload and PNA. VSS. Independent with ADLs. Safety and falls precaution in place. Will continue to monitor pt needs.

## 2021-09-12 ENCOUNTER — Encounter (RURAL_HEALTH_CENTER): Payer: Self-pay | Admitting: Gerontology

## 2021-09-12 LAB — CBC WITH DIFF
BASOPHIL #: <0.1 10*3/uL (ref <=0.20)
BASOPHIL %: 0 %
EOSINOPHIL #: <0.1 10*3/uL (ref <=0.50)
EOSINOPHIL %: 1 %
HCT: 26.9 % — ABNORMAL LOW (ref 34.8–46.0)
HGB: 8.4 g/dL — ABNORMAL LOW (ref 11.5–16.0)
IMMATURE GRANULOCYTE #: <0.1 10*3/uL (ref <0.10)
IMMATURE GRANULOCYTE %: 1 % (ref 0–1)
LYMPHOCYTE #: 1.47 10*3/uL (ref 1.00–4.80)
LYMPHOCYTE %: 21 %
MCH: 33.3 pg — ABNORMAL HIGH (ref 26.0–32.0)
MCHC: 31.2 g/dL (ref 31.0–35.5)
MCV: 106.7 fL — ABNORMAL HIGH (ref 78.0–100.0)
MONOCYTE #: 0.53 10*3/uL (ref 0.20–1.10)
MONOCYTE %: 8 %
MPV: 10.4 fL (ref 8.7–12.5)
NEUTROPHIL #: 4.98 10*3/uL (ref 1.50–7.70)
NEUTROPHIL %: 69 %
PLATELETS: 199 10*3/uL (ref 150–400)
RBC: 2.52 10*6/uL — ABNORMAL LOW (ref 3.85–5.22)
RDW-CV: 16.1 % — ABNORMAL HIGH (ref 11.5–15.5)
WBC: 7.1 10*3/uL (ref 3.7–11.0)

## 2021-09-12 LAB — ECG 12-LEAD
Atrial Rate: 119 {beats}/min
Calculated P Axis: 62 degrees
Calculated R Axis: -15 degrees
Calculated T Axis: 11 degrees
PR Interval: 138 ms
QRS Duration: 70 ms
QT Interval: 312 ms
QTC Calculation: 438 ms
Ventricular rate: 119 {beats}/min

## 2021-09-12 LAB — POC FINGERSTICK GLUCOSE - BMC/JMC (RESULTS): GLUCOSE, POC: 163 mg/dl — ABNORMAL HIGH (ref 60–100)

## 2021-09-12 LAB — BASIC METABOLIC PANEL
ANION GAP: 12 mmol/L (ref 4–13)
BUN/CREA RATIO: 7 (ref 6–22)
BUN: 42 mg/dL — ABNORMAL HIGH (ref 8–25)
CALCIUM: 9.2 mg/dL (ref 8.5–10.0)
CHLORIDE: 102 mmol/L (ref 96–111)
CO2 TOTAL: 19 mmol/L — ABNORMAL LOW (ref 22–30)
CREATININE: 6.3 mg/dL — ABNORMAL HIGH (ref 0.60–1.05)
ESTIMATED GFR: 7 mL/min/BSA — ABNORMAL LOW (ref >=60)
GLUCOSE: 100 mg/dL (ref 65–125)
POTASSIUM: 4 mmol/L (ref 3.5–5.1)
SODIUM: 133 mmol/L — ABNORMAL LOW (ref 136–145)

## 2021-09-12 LAB — TROPONIN-I: TROPONIN I: 2223 ng/L (ref <=30)

## 2021-09-12 MED ORDER — HEPARIN (PORCINE) 1,000 UNIT/ML INJECTION FOR DIALYSIS
4000.0000 [IU] | INTRAMUSCULAR | Status: AC
Start: 2021-09-12 — End: 2021-09-12
  Administered 2021-09-12: 4000 [IU]

## 2021-09-12 MED ORDER — LOSARTAN 25 MG TABLET
25.0000 mg | ORAL_TABLET | Freq: Every evening | ORAL | Status: DC
Start: 2021-09-12 — End: 2021-09-15
  Administered 2021-09-12 – 2021-09-14 (×3): 25 mg via ORAL
  Filled 2021-09-12 (×3): qty 1

## 2021-09-12 MED ORDER — DARBEPOETIN ALFA 60 MCG/0.3 ML IN POLYSORBATE INJECTION SYRINGE
60.0000 ug | INJECTION | INTRAMUSCULAR | Status: AC
Start: 2021-09-12 — End: 2021-09-12
  Administered 2021-09-12: 60 ug via INTRAVENOUS
  Filled 2021-09-12: qty 0.3

## 2021-09-12 MED ORDER — LABETALOL 100 MG TABLET
100.0000 mg | ORAL_TABLET | Freq: Three times a day (TID) | ORAL | Status: DC
Start: 2021-09-12 — End: 2021-09-15
  Administered 2021-09-12 – 2021-09-13 (×6): 100 mg via ORAL
  Administered 2021-09-14: 0 mg via ORAL
  Administered 2021-09-14 – 2021-09-15 (×4): 100 mg via ORAL
  Filled 2021-09-12 (×11): qty 1

## 2021-09-12 NOTE — Progress Notes (Signed)
Still congested.   Chest pressure resolved.  Labs reviewed including drop in Hgb and increased Troponin I overnight.   HD today with Aranesp on HD.

## 2021-09-12 NOTE — Care Plan (Signed)
Problem: Adult Inpatient Plan of Care  Goal: Plan of Care Review  Outcome: Ongoing (see interventions/notes)  Goal: Patient-Specific Goal (Individualized)  Outcome: Ongoing (see interventions/notes)  Goal: Absence of Hospital-Acquired Illness or Injury  Outcome: Ongoing (see interventions/notes)  Goal: Optimal Comfort and Wellbeing  Outcome: Ongoing (see interventions/notes)  Goal: Rounds/Family Conference  Outcome: Ongoing (see interventions/notes)     Problem: Fluid Volume Excess  Goal: Fluid Balance  Outcome: Ongoing (see interventions/notes)

## 2021-09-12 NOTE — Nurses Notes (Signed)
"HEMODIALYSIS SUMMARY"   PATIENT: [name] MRN: [id number] ROOM: 226/A    ORDERS:  Order  HEMODIALYSIS [DIACHI1] (Order [id number])  Simcha, [place]  Order #: [id number] Accession #: MRN: [id number]  General Information  No case/log ID found   Order Providers  Authorizing   Marijo Sanes, MD   Order Information  Date Department Ordering/Authorizing   09/12/2021 [facility] 26F Marijo Sanes, MD   Start Date/Time  Start Time   09/12/21 0515   Released/Completed Orders  Released On Released By Completed On Completed By   09/12/21 0503 Marijo Sanes, MD (auto-released)     Standing Order Information  Remaining Occurrences Interval Last Released     0/1 ONE TIME 09/12/2021     Released Orders   Released On Order # Scheduled For Released By   1. 09/12/2021 5:03 AM [id number] 09/12/2021 5:15 AM Marijo Sanes, MD (auto-released)   Comments  Heparin 1,000 units per hour   Order Questions  Question Answer Comment   Reason for Treatment: End stage renal disease(CKD-Stage6)    Length of Dialysis (hrs): 4    Target Blood Flow Rate (ml/min): 400    Size of fistula needle: 15 gauge - 1 inch    Dialyzer: Nipro 15H    Fluid Removal (kg): 3.0 1-3L as tolerated   Dialysate flow rate (ml/min): 700    Acid Bath: 2 K / 2.5 Ca    Bicarb Settings: 38    Sodium Type: Linear    Sodium: 137    Calcium: 3.0    Type of access: Fistula    Dialysate temperature: 36 degrees Celsius    HEMODIALYSIS  Electronically signed by: Marijo Sanes, MD on 09/12/21 0503   Ordering user: Marijo Sanes, MD 09/12/21 0503 Ordering provider: Marijo Sanes, MD   Authorized by: Marijo Sanes, MD Ordering mode: Standard   Authorizing Provider NPI information  Marijo Sanes, MD                [phone]  Patient Release Status:   This result is automatically blocked from the patient.    [facility] Result Comments   Not Released  Not seen      Order Transmittal Tracking/Results Routing Info  Order Transmittal Tracking/Results Routing Info   Order  Set Info  Order Set Source   Did not come from Order set      Reprint Order    HEMODIALYSIS (Order [id number]) on 09/12/21        LABS:   Latest Reference Range & Units 09/12/21 05:45   WBC 3.7 - 11.0 x103/uL 7.1   HGB 11.5 - 16.0 g/dL 8.4 (L)   HCT 34.8 - 46.0 % 26.9 (L)   PLATELET COUNT 150 - 400 x103/uL 199   RBC 3.85 - 5.22 x106/uL 2.52 (L)   SODIUM 136 - 145 mmol/L 133 (L)   POTASSIUM 3.5 - 5.1 mmol/L 4.0   CHLORIDE 96 - 111 mmol/L 102   CARBON DIOXIDE 22 - 30 mmol/L 19 (L)   BUN 8 - 25 mg/dL 42 (H)   CREATININE 0.60 - 1.05 mg/dL 6.30 (H)   GLUCOSE 65 - 125 mg/dL 100   ANION GAP 4 - 13 mmol/L 12   BUN/CREAT RATIO 6 - 22  7   ESTIMATED GLOMERULAR FILTRATION RATE >=60 mL/min/BSA 7 (L)   CALCIUM 8.5 - 10.0 mg/dL 9.2   TROPONIN-I <=30 ng/L 2,223 (HH)   [facility]): Data is critically high  (  L): Data is abnormally low  (H): Data is abnormally high  CONSENT SIGNED: YES  BLOOD CONSENT:      N/A: YES  DX:   Patient Active Problem List   Diagnosis    Diabetic foot infection (CMS HCC)    Type 2 diabetes mellitus (CMS HCC)    Cellulitis of foot, left    Essential hypertension    Obesity    C. 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)     DIABETIC:YES  CODE STATUS: Full Code  ALLERGIES:  Allergies   Allergen Reactions    Hydrocodone     Ibuprofen      DIET: DIET DIABETIC/ADA Low phosphorus  ACCESS: LUA AVF     NEEDLE SIZE(G):15   WT(KG):117.1    EDW(KG):114     UFG(L):1-3L  PRE TX NURSE REPORT FROM/TIME: JONATHAN RACEY, RN '@0830'   TIME OUT TIME/SAFETY CHECKS:1000  HEPATITIS STATUS::    HBSAG  DATE:08/15/2021       RESULTS:NEGATIVE    HBSAB  DATE:04/11/2021       RESULTS:IMMUNE/>1000  MOBILITY::   BED: YES   STRETCHER:  1ST TIME ACUTE:    STAT:    ROUTINE: YES-MWF    URGENT:   DAILY EVAL:  ACUTE ROOM: YES     BEDSIDE:     ICU:  ISOLATION PRECAUTION::DIALYSIS:  YES    [place]:   CONTACT:    REVERSE:    DROPLET:  SPECIAL CONSIDERATIONS: NONE  TOTAL CHLORINE < 0.1PPM  1ST CHECK TIME:0800         2ND CHECK TIME:1200  TREATMENT INITIATION:1010  BATH K/CA:2K-3CA      BFR/DFR:400/600      NA: 137       HCO3:38  ISOLATED UF ONLY:NO  INCAPACITATED NURSE EDUCATION COMPLETED:      MULTI-SUITE:  YES  MD NOTIFIED AT THE START: DR.WELCH     09/12/21 1000   Machine Scientist, clinical (histocompatibility and immunogenetics) P   Machine Number 3   Machine Temp 36 C (96.8 F)   Alarms Set Yes (Pre-Set)   Conductivity 13.7   Bleach -   Independent pH 7.2   Dialysate Used: premixed hemodialysate (NATURALYTE) 3.43 L;with potassium chloride 2 mEq/L;with calcium chloride 3 mEq/L   Dialyzer Used: Nipro 15H   Med Being Verified DIALYSATE/HEPARIN   Med Verification (1st) Clinician Verification of Medication/Blood   Additional Pre- Dialysis Documentation   Hepatitis B Status Checked: Negative Surface Antigen;Immunity   Hepatitis B Date Drawn 08/15/21   Pre-Dialysis Safety Checks Yes   Total Chlorine <0.1   Dialysis Prescription   Order Confirmed Yes   Pre Dialysis Vitals   Dry Weight 114 kg (251 lb 5.2 oz)   Weight (Pre Dialysis) 117 kg (258 lb 2.5 oz)   Weight 117 kg (258 lb 2.5 oz)   Weight Source Bed   BMI (Calculated) 40.52   Temp 35.9 C (96.6 F)   BP (Non-Invasive) (!) 142/66   BP Source (Non-Invasive) C   Heart Rate 83   SpO2 99 %(ROOM AIR)  Pre Dialysis Assessment   Patient Status Arrived;Stable;Report Received   Cardiac Pulse Regular   Respiratory Dyspnea On Exertion;Wheezes   Mental Status Alert   GI Complaints No Complaint   Fluid Assessment Lower Extremity Edema   Lower Extremity Edema Status Moderate   Pain Score 0   Patient Report Received From: Devoria Glassing, RN '@0830'    Vascular Access   Was Catheter Placed in Dialysis? No   Access Site Left Arm;Upper   Access  Type AV Fistula   Access Assessment WDL   Needle Size 15 gauge   Access Patent Yes   Dialysis Consent   Consent Obtained Written   Date Consent Obtained 09/12/21   Procedure Time Out   Consent Obtained Verbal   Patient Identification (name and date of birth): Patient Verbally Identified Self;Verified Patient Identification Band   Patient Position Fowlers (45-60 degrees)   Procedure Verified? Yes   Time Out Performed By Alonza Bogus, RN '@1000'       09/12/21 1010   Hemodialysis Monitor   Patient Status Stable;Report Received(TREATMENT INITIATED)   Patient Report Received From: Devoria Glassing, RN '@0830'    Time On 1010   Vitals and Machine Parameters   BP 143/67   Pulse 80   Blood Flowrate 400 ML/MIN   Arterial Pressure -140 mmHg   Venous Pressure 200 mmHg   Transmembrane Pressure (TMP) 60 mmHg   Ultrafiltration Rate (Dialysis) 930   Fluids Normal Saline   IV Fluid Administered Due To: (PRIME)   Fluid Volume 250 mL   Fluid Goal 3.2 Kg   Actual Fluid Goal 3.7 Kg   Actual Fluid Removal 0 mL   Patient Observed Yes(ALERT)   Additional Hemodialysis Documentation   Dialysis Access Visible Yes   Hemodialysis Line Secure Yes   Machine Serial Number [id number]   RO Serial Number MAIN   Alarm Pass Time 0917   Dialyzer Lot# [id number]   Tubing Lot# [id number]   RO/Machine Log Complete Yes   All Connections Secured Yes   Venous/Arterial parameters set Yes   Prime Given 250   Air Foam detector engages  Yes   Saline Line double clamped  Yes   Incapacitated Nurse Education Completed  Yes      09/12/21 1015 09/12/21 1030 09/12/21 1045   Hemodialysis Monitor   Patient Status Stable Stable Stable   Vitals and Machine Parameters   BP 127/69 139/74 144/71   Pulse 78 77 80   Blood Flowrate 400 ML/MIN 400 ML/MIN 400 ML/MIN   Arterial Pressure  --  -150 mmHg  --    Venous Pressure  --  200 mmHg  --    Transmembrane Pressure (TMP)  --  60 mmHg  --    Ultrafiltration Rate (Dialysis) 930 930 930   Fluid Goal 3.2 Kg 3.2 Kg 3.2 Kg    Actual Fluid Goal 3.7 Kg 3.7 Kg 3.7 Kg   Actual Fluid Removal 127 mL 319 mL 559 mL   Patient Observed Yes Yes(ON PHONE) Yes   Additional Hemodialysis Documentation   Dialysis Access Visible Yes Yes Yes   Hemodialysis Line Secure Yes Yes Yes      09/12/21 1100 09/12/21 1115 09/12/21 1130   Hemodialysis Monitor   Patient Status Stable Stable Stable   Vitals and Machine Parameters   BP 145/66 160/63 160/73   Pulse 80 79 81   Blood Flowrate 400 ML/MIN 400 ML/MIN 400 ML/MIN   Arterial Pressure -160 mmHg  --  -150 mmHg   Venous Pressure  210 mmHg  --  260 mmHg   Transmembrane Pressure (TMP) 60 mmHg  --  60 mmHg   Ultrafiltration Rate (Dialysis) 930 930 930   Fluid Goal 3.2 Kg 3.2 Kg 3.2 Kg   Actual Fluid Goal 3.7 Kg 3.7 Kg 3.7 Kg   Actual Fluid Removal 904 mL 1097 mL 1321 mL   Patient Observed Yes(ON PHONE) Yes Yes(ON PHONE)   Additional Hemodialysis Documentation   Dialysis Access Visible Yes Yes Yes   Hemodialysis Line Secure Yes Yes Yes      09/12/21 1145 09/12/21 1200 09/12/21 1215   Hemodialysis Monitor   Patient Status Stable Stable Stable   Vitals and Machine Parameters   BP 163/63 162/71 159/66   Pulse 80 81 81   Blood Flowrate 400 ML/MIN 400 ML/MIN 400 ML/MIN   Arterial Pressure  --  -140 mmHg  --    Venous Pressure  --  270 mmHg  --    Transmembrane Pressure (TMP)  --  60 mmHg  --    Ultrafiltration Rate (Dialysis) 920 920 920   Fluid Goal 3.2 Kg 3.2 Kg 3.2 Kg   Actual Fluid Goal 3.7 Kg 3.7 Kg 3.7 Kg   Actual Fluid Removal 1475 mL 1744 mL 1949 mL   Patient Observed Yes Yes(ON PHONE) Yes   Additional Hemodialysis Documentation   Dialysis Access Visible Yes Yes Yes   Hemodialysis Line Secure Yes Yes Yes      09/12/21 1230 09/12/21 1245 09/12/21 1300   Hemodialysis Monitor   Patient Status Stable Stable Stable   Vitals and Machine Parameters   BP 148/49 146/58 149/50   Pulse 90 82 85   Blood Flowrate 400 ML/MIN 400 ML/MIN 400 ML/MIN   Arterial Pressure -170 mmHg  --  -170 mmHg   Venous Pressure 210 mmHg  --  220  mmHg   Transmembrane Pressure (TMP) 60 mmHg  --  60 mmHg   Ultrafiltration Rate (Dialysis) 920 920 920   Fluid Goal 3.2 Kg 3.2 Kg 3.2 Kg   Actual Fluid Goal 3.7 Kg 3.7 Kg 3.7 Kg   Actual Fluid Removal 2193 mL 2378 mL 2593 mL   Patient Observed Yes(ON PHONE) Yes Yes(ON PHONE)   Additional Hemodialysis Documentation   Dialysis Access Visible Yes Yes Yes   Hemodialysis Line Secure Yes Yes Yes      09/12/21 1315 09/12/21 1330 09/12/21 1345   Hemodialysis Monitor   Patient Status Stable Stable Stable   Vitals and Machine Parameters   BP 146/64 149/56 160/47   Pulse 82 81 83   Blood Flowrate 400 ML/MIN 400 ML/MIN 400 ML/MIN   Arterial Pressure  --  -170 mmHg  --    Venous Pressure  --  220 mmHg  --    Transmembrane Pressure (TMP)  --  60 mmHg  --    Ultrafiltration Rate (Dialysis) 920 980 980   Fluid Goal 3.2 Kg 3.2 Kg 3.2 Kg   Actual Fluid Goal 3.7 Kg 3.7 Kg 3.7 Kg   Actual Fluid Removal 2794 mL 3083 mL 3282 mL   Patient Observed Yes Yes(ON PHONE) Yes   Additional Hemodialysis Documentation   Dialysis Access Visible Yes Yes Yes   Hemodialysis Line Secure Yes Yes Yes      09/12/21 1400 09/12/21 1410   Hemodialysis Monitor   Patient Status Stable Stable(TREATMENT COMPLETE)   Vitals and Machine Parameters   BP 142/68 172/68   Pulse 85 93   SpO2  --  98 %(ROOM  AIR)   Kt/V  --  1.77   Blood Flowrate 400 ML/MIN 400 ML/MIN   Arterial Pressure -160 mmHg -160 mmHg   Venous Pressure 210 mmHg 210 mmHg   Transmembrane Pressure (TMP) 60 mmHg 60 mmHg   Ultrafiltration Rate (Dialysis) 980 980   Fluids  --  Normal Saline   IV Fluid Administered Due To:  --  Standard Rinseback   Fluid Volume  --  250 mL   Fluid Goal 3.2 Kg 3.2 Kg   Actual Fluid Goal 3.7 Kg 3.7 Kg   Actual Fluid Removal 3687 mL 3700 mL   Treatment Total Fluid Removal  --  3200 mL   Patient Observed Yes(ON PHONE) Yes(ALERT)   Additional Hemodialysis Documentation   Dialysis Access Visible Yes Yes   Hemodialysis Line Secure Yes Yes      09/12/21 1435   Hemodialysis Monitor    Patient Status Stable(TREATMENT COMPLETED '@1410' )   Post Dialysis Vitals   Weight (Post Dialysis) 114 kg (251 lb 1.7 oz)   Weight 114 kg (251 lb 1.7 oz)   Weight Source Bed   Temp 36.1 C (97 F)   Temp Source Skin   BP 172/68   BP Source (Non-Invasive) C   Pulse 93   RR 20   End Treatment Kt/V 1.77   Post Dialysis Assessment   Time Off 1410   $ Treatment Completed Yes   Cardiac Pulse Regular   Respiratory Dyspnea On Exertion;Wheezes   Post Vascular Assessment bruit audible, strong;thrill palpable, strong   Dialyzer Appearance Streaked <50%   Dialysis Outcome Goal Met   Post Hemodialysis Patient Status Stable;Report Given(JONATHAN RACEY, RN '@1435' )     MEDICATIONS/BLOOD PRODUCTS:  darbepoetin alfa in polysorbate (ARANESP) 60 mcg/0.3 mL injection 60 mcg GIVE IN DIALYSIS 09/12/2021 0502 (none)   Route: Intravenous     Admin Amount: 0.3 mL = 60 mcg of 60 mcg/0.3 mL     Volume: 0.3 mL       heparin 1,000 units/mL injection for dialysis 4,000 Units GIVE IN DIALYSIS 09/12/2021 1132 (none)   Route: Intracatheter     Admin Amount: 4 mL = 4,000 Units of 1,000 Units/mL     Volume: 10 mL       EDUCATION::     Patient: YES   Others:    Knowledge Base::Substantial: YES   Minimal:   None:    Barriers to Learning::         None:YES    Teaching Method::Oral: YES  Written:  Visual:    Hands On/ Demo:    Topic::Access Care:Y  S&S of Inf.:Y  Fluid Mgmt:  K+:  Procedural:Y Albumin:   Meds:  Tx Options:   Transplant:    Diet:   Others:    Patient Response::Verbalized Understanding:Y  Demonstrate Understanding:   Teach Back:    Return Demo:   No Evi. for Learning:    Requires Follow Up::   None/N/A:YES    POST TX NOTES: HEMODIALYSIS TREATMENT COMPLETED, PATIENT TOLERATED TREATMENT WELL, FLUID GOAL MET 1-3L    POST TX ACCESS::    AVF/AVG:  BLEEDING STOP ART_10___MIN  VEN__10__MIN   +BRUIT/THRILL:YES  ACCESS FLOW: Good: YES  Poor:   Positional:   Reversed:   Clotted:  BVP_86.7___L        KT/V__1.77__  NET UF_3200___ML  PRIMARY  NURSE REPORT POST TX, RN & TIME:JONATHAN RACEY, RN '@1435'

## 2021-09-12 NOTE — Care Management Notes (Signed)
09/12/21 1610   Assessment Detail   Assessment Type Admission   Date of Care Management Update 09/12/21   Date of Next DCP Update 09/13/21   Readmission   Is this a readmission? No   Social Work Animal nutritionist Status initial meeting   Anticipated Discharge Disposition Home  (goes to HD MWF)   Discharge Needs Assessment   Equipment Currently Used at Home none   Equipment Needed After Discharge none   Transportation Available car;family or friend will provide   Referral Information   Admission Type observation   Arrived From home or self-care   Address Verified verified-no changes   I met with patient and her daughter, [name] at bedside, to start DC planning. Address, phone numbers, and insurance verified. Patient lives with her spouse, 2 sons, and her daughter in a 1 story home without steps to enter. Patient drives and so do the other adults in her home. No DME. Prescriptions filled through [facility]. PCP is [name] Doyne, NP. Patient goes to [facility] on [address]. On MWF scheduled for 130pm chair time. Will resume after DC. Denies new DC needs at this time. CM will continue to follow for DC planning.

## 2021-09-12 NOTE — Pharmacy (Addendum)
[facility]  [place], [place] [address]  Pharmacy Chart Review      [name]  Aug 03, 1966  [id number]    For this patient, I have reviewed their medication profile and have provided some recommendations.    Please consider the following changes:    Recommend switching to Heparin 5000 units SUQ Q8 hours for VTE prophylaxis as Lovenox use should be avoided in hemodialysis patients.     Frann Rider, PHARMACY RESIDENT  09/12/2021, 08:30

## 2021-09-12 NOTE — Care Plan (Signed)
Problem: Adult Inpatient Plan of Care  Goal: Plan of Care Review  09/12/2021 0219 by Cyd Silence, RN  Outcome: Ongoing (see interventions/notes)  09/12/2021 0028 by Cyd Silence, RN  Outcome: Ongoing (see interventions/notes)  Goal: Patient-Specific Goal (Individualized)  09/12/2021 0219 by Cyd Silence, RN  Outcome: Ongoing (see interventions/notes)  09/12/2021 0028 by Cyd Silence, RN  Outcome: Ongoing (see interventions/notes)  Flowsheets (Taken 09/11/2021 2000)  Individualized Care Needs: none verbalized  Anxieties, Fears or Concerns: none verbalized  Goal: Absence of Hospital-Acquired Illness or Injury  09/12/2021 0219 by Cyd Silence, RN  Outcome: Ongoing (see interventions/notes)  09/12/2021 0028 by Cyd Silence, RN  Outcome: Ongoing (see interventions/notes)  Intervention: Identify and Manage Fall Risk  Recent Flowsheet Documentation  Taken 09/11/2021 2000 by Cyd Silence, RN  Safety Promotion/Fall Prevention:   activity supervised   fall prevention program maintained   nonskid shoes/slippers when out of bed   safety round/check completed  Intervention: Prevent Skin Injury  Recent Flowsheet Documentation  Taken 09/11/2021 2000 by Cyd Silence, RN  Body Position: supine, head elevated  Skin Protection:   adhesive use limited   transparent dressing maintained   tubing/devices free from skin contact  Intervention: Prevent and Manage VTE (Venous Thromboembolism) Risk  Recent Flowsheet Documentation  Taken 09/11/2021 2000 by Cyd Silence, RN  VTE Prevention/Management:   ambulation promoted   compression stockings off   dorsiflexion/plantar flexion performed   foot pump device off   sequential compression devices off  Goal: Optimal Comfort and Wellbeing  09/12/2021 0219 by Cyd Silence, RN  Outcome: Ongoing (see interventions/notes)  09/12/2021 0028 by Cyd Silence, RN  Outcome: Ongoing (see interventions/notes)  Intervention: Provide Person-Centered Care  Recent Flowsheet  Documentation  Taken 09/11/2021 2000 by Cyd Silence, [place] Relationship/Rapport:   care explained   choices provided   emotional support provided   questions encouraged   thoughts/feelings acknowledged  Goal: Rounds/Family Conference  09/12/2021 0219 by Cyd Silence, RN  Outcome: Ongoing (see interventions/notes)  09/12/2021 0028 by Cyd Silence, RN  Outcome: Ongoing (see interventions/notes)     Problem: Fluid Volume Excess  Goal: Fluid Balance  09/12/2021 0219 by Cyd Silence, RN  Outcome: Ongoing (see interventions/notes)  09/12/2021 0028 by Cyd Silence, RN  Outcome: Ongoing (see interventions/notes)  Intervention: Monitor and Manage Hypervolemia  Recent Flowsheet Documentation  Taken 09/11/2021 2000 by Cyd Silence, RN  Skin Protection:   adhesive use limited   transparent dressing maintained   tubing/devices free from skin contact

## 2021-09-12 NOTE — Nurses Notes (Signed)
Patient remained alert and oriented during my shift. .Patient had call bell within reach. Patient has no complaints and is resting att. Will continue to monitor.

## 2021-09-12 NOTE — Progress Notes (Signed)
[facility]  HOSPITALIST INPATIENT PROGRESS NOTE      [name], [name]  Date of Birth:  1966/03/06     PCP: Ocie Doyne, NP    Date of Admission:  09/11/2021    Date of Service:  09/12/2021    Chief Complaint and Hospital Course:  Admitted with vomiting, generalized weakness, cough, SOB. PMH - ESRD on HD, h/o RCC s/p nephrectomy, DM2, HTN      SUBJECTIVE     No overnight events, no chest pain, congestion improving            ASSESSMENT & PLAN   Problem List:  Active Hospital Problems   (*Primary Problem)    Diagnosis    *Volume overload    ESRD on dialysis (CMS [facility])    Essential hypertension     Chronic     Not at goal. Losartan increased already. Will add Amlodipine 5. Goal <120/80      Type 2 diabetes mellitus (CMS HCC)     Chronic     Control much improved - A1C 5.2% in January. 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 Lovenox SC    Code status: Full code    Creta Levin, MD   [facility]          OBJECTIVE     Vital Signs:  Temp (24hrs) Max:37.4 C (99.3 F)      Temperature: 36.9 C (98.4 F)  BP (Non-Invasive): 133/62  MAP (Non-Invasive): 79 mmHG  Heart Rate: 78  Respiratory Rate: 17  SpO2: 99 %      Objective     Current Medications:  acetaminophen  (TYLENOL) tablet, 650 mg, Oral, Q6H PRN  albuterol 90 mcg per inhalation oral inhaler - "Respiratory to administer", 1 Puff, Inhalation, Q6H PRN  ALPRAZolam (XANAX) tablet, 0.25 mg, Oral, Daily PRN  atorvastatin (LIPITOR) tablet, 20 mg, Oral, QPM  cefTRIAXone (ROCEPHIN) 2 g in NS 50 mL IVPB minibag, 2 g, Intravenous, Q24H  cyclobenzaprine (FLEXERIL) tablet, 5 mg, Oral, 3x/day PRN  darbepoetin alfa in polysorbate (ARANESP) 60 mcg/0.3 mL injection, 60 mcg, Intravenous, Give in Dialysis  doxycycline tablet, 100 mg, Oral, 2x/day  enoxaparin (LOVENOX) 30 mg/0.3 mL SubQ injection, 30 mg, Subcutaneous, Daily  escitalopram (LEXAPRO) tablet, 10 mg, Oral, NIGHTLY  fluticasone (FLONASE) 50 mcg per spray nasal spray, 1 Spray, Each Nostril, Daily PRN  labetalol (NORMODYNE) tablet, 100 mg, Oral, Q8HRS  linagliptin (TRADJENTA) tablet, 5 mg, Oral, Daily  loratadine (CLARITIN) tablet, 10 mg, Oral, Daily PRN  losartan (COZAAR) tablet, 25 mg, Oral, Daily after Dinner  morphine 4 mg/mL injection, 4 mg, Intravenous, Q5 Min PRN  nitroGLYCERIN (NITROSTAT) sublingual tablet, 0.4 mg, Sublingual, Q5 Min PRN  NS 250 mL flush bag, ,  Intravenous, Q1H PRN  NS flush syringe, 10 mL, Intravenous, Q8H  NS flush syringe, 10 mL, Intravenous, Q8HRS  NS flush syringe, 10 mL, Intravenous, Q1H PRN  ondansetron (ZOFRAN) 2 mg/mL injection, 4 mg, Intravenous, Q8H PRN  traMADol (ULTRAM) tablet, 50 mg, Oral, Q6H PRN        Today's Physical Exam:  General: appears in good health. No distress.   Eyes: Pupils equal and round, reactive to light and accomodation.   HENT:Head atraumatic and normocephalic   Neck: No JVD or thyromegaly or lymphadenopathy   Lungs: CTAB, non labored breathing, no rales or wheezing.    Cardiovascular: regular rate and rhythm, S1, S2 normal, no murmur,   Abdomen: Soft, non-tender, Bowel sounds normal  Extremities: extremities normal, atraumatic, no cyanosis or edema   Skin: Skin warm and dry   Neurologic: Grossly normal   Psychiatric:  Normal affect, behavior,         I/O:  I/O last 24 hours:      Intake/Output Summary (Last 24 hours) at 09/12/2021 1015  Last data filed at 09/12/2021 1000  Gross per 24 hour   Intake 490 ml   Output --   Net 490 ml     I/O current shift:  12/19 0700 - 12/19 1859  In: 240 [P.O.:240]  Out: -       Labs  Please indicate ordered or reviewed)  Reviewed: I have reviewed all lab results.        Radiology Tests (Please indicate ordered or reviewed)  Reviewed: personally reviewed all available images    Medical decision making:  All pertinent labs were personally reviewed with additional follow-up lab work ordered as deemed clinically appropriate.  All available imaging results and EKGs were reviewed and independently interpreted.  Pertinent previous medical record results were reviewed.  Further workup and treatment depending on clinical cours            Portions of this note may be dictated using voice recognition software or a dictation service. Variances in spelling and vocabulary are possible and unintentional. Not all errors are caught/corrected. Please notify the [facility] if any discrepancies are noted or if the meaning of any statement is not clear.

## 2021-09-13 NOTE — Care Management Notes (Signed)
09/13/21 0815   Patient Hand-Off   Clinical/Discharge Plan of Care Information Communicated to:  Clinical Care Coordinator   Comments Althea Grimmer, RN/CM   Facesheet report placed on desk of Angela Nevin, RN/CM.

## 2021-09-13 NOTE — Progress Notes (Signed)
[facility]  HOSPITALIST INPATIENT PROGRESS NOTE      [name], [name]  Date of Birth:  May 31, 1966     PCP: Ocie Doyne, NP    Date of Admission:  09/11/2021    Date of Service:  09/13/2021    Chief Complaint and Hospital Course:  Admitted with vomiting, generalized weakness, cough, SOB. PMH - ESRD on HD, h/o RCC s/p nephrectomy, DM2, HTN      SUBJECTIVE     No overnight events, no chest pain, congestion improving            ASSESSMENT & PLAN   Problem List:  Active Hospital Problems   (*Primary Problem)    Diagnosis    *Volume overload    ESRD on dialysis (CMS [facility])    Essential hypertension     Chronic     Not at goal. Losartan increased already. Will add Amlodipine 5. Goal <120/80      Type 2 diabetes mellitus (CMS HCC)     Chronic     Control much improved - A1C 5.2% in January. 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 Lovenox SC    Code status: Full code    Creta Levin, MD   [facility]          OBJECTIVE     Vital Signs:  Temp (24hrs) Max:37.1 C (98.8 F)      Temperature: 36.8 C (98.2 F)  BP (Non-Invasive): (!) 148/74  MAP (Non-Invasive): 92 mmHG  Heart Rate: 81  Respiratory Rate: 18  SpO2: 94 %      Objective     Current  Medications:  acetaminophen (TYLENOL) tablet, 650 mg, Oral, Q6H PRN  albuterol 90 mcg per inhalation oral inhaler - "Respiratory to administer", 1 Puff, Inhalation, Q6H PRN  ALPRAZolam (XANAX) tablet, 0.25 mg, Oral, Daily PRN  atorvastatin (LIPITOR) tablet, 20 mg, Oral, QPM  cefTRIAXone (ROCEPHIN) 2 g in NS 50 mL IVPB minibag, 2 g, Intravenous, Q24H  cyclobenzaprine (FLEXERIL) tablet, 5 mg, Oral, 3x/day PRN  doxycycline tablet, 100 mg, Oral, 2x/day  enoxaparin (LOVENOX) 30 mg/0.3 mL SubQ injection, 30 mg, Subcutaneous, Daily  escitalopram (LEXAPRO) tablet, 10 mg, Oral, NIGHTLY  fluticasone (FLONASE) 50 mcg per spray nasal spray, 1 Spray, Each Nostril, Daily PRN  labetalol (NORMODYNE) tablet, 100 mg, Oral, Q8HRS  linagliptin (TRADJENTA) tablet, 5 mg, Oral, Daily  loratadine (CLARITIN) tablet, 10 mg, Oral, Daily PRN  losartan (COZAAR) tablet, 25 mg, Oral, Daily after Dinner  morphine 4 mg/mL injection, 4 mg, Intravenous, Q5 Min PRN  nitroGLYCERIN (NITROSTAT) sublingual tablet, 0.4 mg, Sublingual, Q5 Min PRN  NS 250 mL flush bag, , Intravenous, Q1H PRN  NS flush syringe, 10 mL, Intravenous, Q8H  NS flush  syringe, 10 mL, Intravenous, Q8HRS  NS flush syringe, 10 mL, Intravenous, Q1H PRN  ondansetron (ZOFRAN) 2 mg/mL injection, 4 mg, Intravenous, Q8H PRN  traMADol (ULTRAM) tablet, 50 mg, Oral, Q6H PRN        Today's Physical Exam:  General: appears in good health. No distress.   Eyes: Pupils equal and round, reactive to light and accomodation.   HENT:Head atraumatic and normocephalic   Neck: No JVD or thyromegaly or lymphadenopathy   Lungs: CTAB, non labored breathing, no rales or wheezing.    Cardiovascular: regular rate and rhythm, S1, S2 normal, no murmur,   Abdomen: Soft, non-tender, Bowel sounds normal  Extremities: extremities normal, atraumatic, no cyanosis or edema   Skin: Skin warm and dry   Neurologic: Grossly normal   Psychiatric: Normal affect, behavior,         I/O:  I/O last 24 hours:      Intake/Output  Summary (Last 24 hours) at 09/13/2021 1143  Last data filed at 09/13/2021 1000  Gross per 24 hour   Intake 740 ml   Output 3202 ml   Net -2462 ml     I/O current shift:  12/20 0700 - 12/20 1859  In: 180 [P.O.:180]  Out: -       Labs  Please indicate ordered or reviewed)  Reviewed: I have reviewed all lab results.        Radiology Tests (Please indicate ordered or reviewed)  Reviewed: personally reviewed all available images    Medical decision making:  All pertinent labs were personally reviewed with additional follow-up lab work ordered as deemed clinically appropriate.  All available imaging results and EKGs were reviewed and independently interpreted.  Pertinent previous medical record results were reviewed.  Further workup and treatment depending on clinical cours            Portions of this note may be dictated using voice recognition software or a dictation service. Variances in spelling and vocabulary are possible and unintentional. Not all errors are caught/corrected. Please notify the [facility] if any discrepancies are noted or if the meaning of any statement is not clear.

## 2021-09-13 NOTE — Progress Notes (Deleted)
CKD rising. 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 Mild hyperkalemia. Daptomycin held.   I changed diet and supplements to low phosphorus and potassium. Lokelma 10 gm bid until potassium normal then discontinue.

## 2021-09-13 NOTE — Progress Notes (Signed)
Tolerated HD yesterday with 3.2 liters removed.   Next HD tomorrow.

## 2021-09-13 NOTE — Nurses Notes (Signed)
No c/o of chest pain/discomfort this shift.  Patient had a uneventful night post HD.   Slept most of the night.

## 2021-09-14 ENCOUNTER — Inpatient Hospital Stay (HOSPITAL_COMMUNITY): Payer: 59

## 2021-09-14 DIAGNOSIS — I517 Cardiomegaly: Secondary | ICD-10-CM

## 2021-09-14 LAB — CBC
HCT: 27.7 % — ABNORMAL LOW (ref 34.8–46.0)
HGB: 8.7 g/dL — ABNORMAL LOW (ref 11.5–16.0)
MCH: 33.1 pg — ABNORMAL HIGH (ref 26.0–32.0)
MCHC: 31.4 g/dL (ref 31.0–35.5)
MCV: 105.3 fL — ABNORMAL HIGH (ref 78.0–100.0)
MPV: 10.2 fL (ref 8.7–12.5)
PLATELETS: 252 10*3/uL (ref 150–400)
RBC: 2.63 10*6/uL — ABNORMAL LOW (ref 3.85–5.22)
RDW-CV: 15.9 % — ABNORMAL HIGH (ref 11.5–15.5)
WBC: 5.6 10*3/uL (ref 3.7–11.0)

## 2021-09-14 LAB — TROPONIN-I: TROPONIN I: 834 ng/L (ref <=30)

## 2021-09-14 LAB — GOLD TOP TUBE

## 2021-09-14 LAB — LAVENDER TOP TUBE

## 2021-09-14 MED ORDER — SODIUM FERRIC GLUCONATE COMPLEX IN SUCROSE 62.5 MG/5 ML INTRAVENOUS
125.0000 mg | INTRAVENOUS | Status: AC
Start: 2021-09-14 — End: 2021-09-14
  Administered 2021-09-14: 0 mg via INTRAVENOUS
  Administered 2021-09-14: 125 mg via INTRAVENOUS
  Filled 2021-09-14: qty 10
  Filled 2021-09-14: qty 5

## 2021-09-14 MED ORDER — DARBEPOETIN ALFA 60 MCG/0.3 ML IN POLYSORBATE INJECTION SYRINGE
60.0000 ug | INJECTION | INTRAMUSCULAR | Status: AC
Start: 2021-09-14 — End: 2021-09-14
  Administered 2021-09-14: 60 ug via INTRAVENOUS
  Filled 2021-09-14: qty 0.3

## 2021-09-14 MED ORDER — HEPARIN (PORCINE) 1,000 UNIT/ML INJECTION FOR DIALYSIS
4000.0000 [IU] | INTRAMUSCULAR | Status: AC
Start: 2021-09-14 — End: 2021-09-14
  Administered 2021-09-14: 4000 [IU]

## 2021-09-14 MED ORDER — LEVOCARNITINE 200 MG/ML INTRAVENOUS SOLUTION
1200.0000 mg | INTRAVENOUS | Status: AC
Start: 2021-09-14 — End: 2021-09-14
  Administered 2021-09-14: 1200 mg via INTRAVENOUS
  Filled 2021-09-14 (×2): qty 10

## 2021-09-14 MED ORDER — SODIUM CHLORIDE 0.9 % INJECTION SOLUTION
2.0000 mL | INTRAVENOUS | Status: AC
Start: 2021-09-14 — End: 2021-09-14
  Administered 2021-09-14: 3 mL via INTRAVENOUS

## 2021-09-14 MED ORDER — BENZONATATE 100 MG CAPSULE
100.0000 mg | ORAL_CAPSULE | Freq: Three times a day (TID) | ORAL | Status: DC
Start: 2021-09-14 — End: 2021-09-15
  Administered 2021-09-14 – 2021-09-15 (×4): 100 mg via ORAL
  Filled 2021-09-14 (×4): qty 1

## 2021-09-14 NOTE — Care Plan (Signed)
Problem: Adult Inpatient Plan of Care  Goal: Plan of Care Review  Outcome: Ongoing (see interventions/notes)  Goal: Patient-Specific Goal (Individualized)  Outcome: Ongoing (see interventions/notes)  Flowsheets (Taken 09/14/2021 1430)  Individualized Care Needs: independent  Anxieties, Fears or Concerns: go home  Goal: Absence of Hospital-Acquired Illness or Injury  Outcome: Ongoing (see interventions/notes)  Intervention: Identify and Manage Fall Risk  Recent Flowsheet Documentation  Taken 09/14/2021 1430 by Courtney Heys, RN  Safety Promotion/Fall Prevention: activity supervised  Intervention: Prevent Skin Injury  Recent Flowsheet Documentation  Taken 09/14/2021 1430 by Courtney Heys, RN  Body Position: sitting  Skin Protection: adhesive use limited  Intervention: Prevent and Manage VTE (Venous Thromboembolism) Risk  Recent Flowsheet Documentation  Taken 09/14/2021 1430 by Courtney Heys, RN  VTE Prevention/Management: ambulation promoted  Goal: Optimal Comfort and Wellbeing  Outcome: Ongoing (see interventions/notes)  Intervention: Provide Person-Centered Care  Recent Flowsheet Documentation  Taken 09/14/2021 1430 by Courtney Heys, RN  Trust Relationship/Rapport: care explained  Goal: Rounds/Family Conference  Outcome: Ongoing (see interventions/notes)     Problem: Fluid Volume Excess  Goal: Fluid Balance  Outcome: Ongoing (see interventions/notes)  Intervention: Monitor and Manage Hypervolemia  Recent Flowsheet Documentation  Taken 09/14/2021 1430 by Courtney Heys, RN  Skin Protection: adhesive use limited

## 2021-09-14 NOTE — Progress Notes (Signed)
[facility]  HOSPITALIST INPATIENT PROGRESS NOTE      [name], May  Date of Birth:  1965/11/22     PCP: Ocie Doyne, NP    Date of Admission:  09/11/2021    Date of Service:  09/14/2021    Chief Complaint and Hospital Course:  Admitted with vomiting, generalized weakness, cough, SOB. PMH - ESRD on HD, h/o RCC s/p nephrectomy, DM2, HTN      SUBJECTIVE     No overnight events, congestion improving, having cough spells, states she is short of breath with ambulation.  Will get echocardiogram done.  Will recheck H&H.  Hoping to discharge her tomorrow.  I will add scheduled Tessalon for cough. Elevated BP noted, should improve w HD.             ASSESSMENT & PLAN   Problem List:  Active Hospital Problems   (*Primary Problem)    Diagnosis    *Volume overload    ESRD on dialysis (CMS [facility])    Essential hypertension     Chronic     Not at goal. Losartan increased already. Will add Amlodipine 5. Goal <120/80      Type 2 diabetes mellitus (CMS HCC)     Chronic     Control much improved - A1C 5.2% in January. 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 Lovenox SC    Code status: Full code          OBJECTIVE     Vital Signs:  Temp (24hrs) Max:37 C (98.6 F)       Temperature: 36.9 C (98.4 F)  BP (Non-Invasive): (!) 198/101  MAP (Non-Invasive): 98 mmHG  Heart Rate: 79  Respiratory Rate: 17  SpO2: 96 % (RA)      Objective     Current Medications:  acetaminophen (TYLENOL) tablet, 650 mg, Oral, Q6H PRN  albuterol 90 mcg per inhalation oral inhaler - "Respiratory to administer", 1 Puff, Inhalation, Q6H PRN  ALPRAZolam (XANAX) tablet, 0.25 mg, Oral, Daily PRN  atorvastatin (LIPITOR) tablet, 20 mg, Oral, QPM  benzonatate (TESSALON) capsule, 100 mg, Oral, 3x/day  cefTRIAXone (ROCEPHIN) 2 g in NS 50 mL IVPB minibag, 2 g, Intravenous, Q24H  cyclobenzaprine (FLEXERIL) tablet, 5 mg, Oral, 3x/day PRN  doxycycline tablet, 100 mg, Oral, 2x/day  enoxaparin (LOVENOX) 30 mg/0.3 mL SubQ injection, 30 mg, Subcutaneous, Daily  escitalopram (LEXAPRO) tablet, 10 mg, Oral, NIGHTLY  fluticasone (FLONASE) 50 mcg per spray nasal spray, 1 Spray, Each Nostril, Daily PRN  labetalol (NORMODYNE) tablet, 100 mg, Oral, Q8HRS  linagliptin (TRADJENTA) tablet, 5 mg, Oral, Daily  loratadine (CLARITIN) tablet, 10 mg, Oral, Daily PRN  losartan (COZAAR) tablet, 25 mg, Oral, Daily after Dinner  morphine 4 mg/mL injection,  4 mg, Intravenous, Q5 Min PRN  nitroGLYCERIN (NITROSTAT) sublingual tablet, 0.4 mg, Sublingual, Q5 Min PRN  NS 250 mL flush bag, , Intravenous, Q1H PRN  NS flush syringe, 10 mL, Intravenous, Q8H  NS flush syringe, 10 mL, Intravenous, Q8HRS  NS flush syringe, 10 mL, Intravenous, Q1H PRN  ondansetron (ZOFRAN) 2 mg/mL injection, 4 mg, Intravenous, Q8H PRN  traMADol (ULTRAM) tablet, 50 mg, Oral, Q6H PRN        Today's Physical Exam:  General: appears in good health. No distress.   Eyes: Pupils equal and round, reactive to light and accomodation.   HENT:Head atraumatic and normocephalic   Neck: No JVD or thyromegaly or lymphadenopathy   Lungs: CTAB, non labored breathing, no rales or wheezing.    Cardiovascular: regular rate and rhythm, S1, S2 normal, no murmur,   Abdomen: Soft, non-tender, Bowel  sounds normal  Extremities: extremities normal, atraumatic, no cyanosis or edema   Skin: Skin warm and dry   Neurologic: Grossly normal   Psychiatric: Normal affect, behavior,         I/O:  I/O last 24 hours:      Intake/Output Summary (Last 24 hours) at 09/14/2021 1649  Last data filed at 09/14/2021 1348  Gross per 24 hour   Intake 440 ml   Output 2002 ml   Net -1562 ml     I/O current shift:  12/21 0700 - 12/21 1859  In: -   Out: 2000 [Dialysis:2000]      Labs  Please indicate ordered or reviewed)  Reviewed: I have reviewed all lab results.        Radiology Tests (Please indicate ordered or reviewed)  Reviewed: personally reviewed all available images    Medical decision making:  All pertinent labs were personally reviewed with additional follow-up lab work ordered as deemed clinically appropriate.  All available imaging results and EKGs were reviewed and independently interpreted.  Pertinent previous medical record results were reviewed.  Further workup and treatment depending on clinical cours            Portions of this note may be dictated using voice recognition software or a dictation service. Variances in spelling and vocabulary are possible and unintentional. Not all errors are caught/corrected. Please notify the [facility] if any discrepancies are noted or if the meaning of any statement is not clear.

## 2021-09-14 NOTE — Progress Notes (Signed)
Less congested.   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 HD today with Aranesp, Ferrlecit, and carnitor on HD today  Discharge when you feel she is ready.

## 2021-09-14 NOTE — Nurses Notes (Signed)
"HEMODIALYSIS SUMMARY"     PATIENT: [name] MRN: [id number] ROOM: 226/A     ORDERS:  Order  HEMODIALYSIS [DIACHI1] (Order [id number])  [name], May  Order #: [id number] Accession #: MRN: [id number]  General Information    No case/log ID found     Order Providers    Authorizing   Marijo Sanes, MD            Order Information    Date Department Ordering/Authorizing   09/14/2021 [facility] 78F Marijo Sanes, MD     Start Date/Time    Start Time   09/14/21 0515     Acknowledgement Info    For At Acknowledged By Acknowledged On   Placing Order 09/14/21 0515 Courtney Heys, RN 09/14/21 4174     Released/Completed Orders    Released On Released By Completed On Completed By   09/14/21 0516 Marijo Sanes, MD (auto-released)               Standing Order Information    Remaining Occurrences Interval Last Released     0/1 ONE TIME 09/14/2021              Released Orders      Released On Order # Scheduled For Released By   1. 09/14/2021  5:16 AM [id number] 09/14/2021  5:15 AM Marijo Sanes, MD (auto-released)            Comments    Heparin 1,000 units per hour            Order Questions    Question Answer Comment   Reason for Treatment: End stage renal disease(CKD-Stage6)    Length of Dialysis (hrs): 4    Target Blood Flow Rate (ml/min): 400    Size of fistula needle: 15 gauge - 1 inch    Dialyzer: Nipro 15H    Fluid Removal (kg): 3.0 1-3L as tolerated   Dialysate flow rate (ml/min): 700    Acid Bath: 2 K / 2.5 Ca    Bicarb Settings: 38    Sodium Type: Linear    Sodium: 137    Calcium: 3.0    Type of access: Fistula    Dialysate temperature: 36 degrees Celsius             Additional Information    Associated Reports   View Encounter   Priority and Order Details     HEMODIALYSIS    Electronically signed by: Marijo Sanes, MD on 09/14/21 6314   Ordering user: Marijo Sanes, MD 09/14/21 0515 Ordering provider: Marijo Sanes, MD   Authorized by: Marijo Sanes, MD Ordering mode: Standard   Authorizing Provider  NPI information    Marijo Sanes, MD                [phone]                  Patient Release Status:     This result is automatically blocked from the patient.               [facility] Result Comments     Not Released  Not seen        Order Transmittal Tracking/Results Routing Info    Order Transmittal Tracking/Results Routing Info     Order Set Info    Order Set Source   Did not come from Order set      Reprint Order    HEMODIALYSIS (Order [id number])  on 09/14/21        LABS:   Latest Reference Range & Units 09/12/21 05:45   SODIUM 136 - 145 mmol/L 133 (L)   POTASSIUM 3.5 - 5.1 mmol/L 4.0   CHLORIDE 96 - 111 mmol/L 102   CARBON DIOXIDE 22 - 30 mmol/L 19 (L)   BUN 8 - 25 mg/dL 42 (H)   CREATININE 0.60 - 1.05 mg/dL 6.30 (H)   GLUCOSE 65 - 125 mg/dL 100   ANION GAP 4 - 13 mmol/L 12   BUN/CREAT RATIO 6 - 22  7   ESTIMATED GLOMERULAR FILTRATION RATE >=60 mL/min/BSA 7 (L)   CALCIUM 8.5 - 10.0 mg/dL 9.2   (L): Data is abnormally low  (H): Data is abnormally high    CONSENT SIGNED: Yes 09/12/2021  BLOOD CONSENT:  N/A  DX:   Patient Active Problem List   Diagnosis    Diabetic foot infection (CMS HCC)    Type 2 diabetes mellitus (CMS HCC)    Cellulitis of foot, left    Essential hypertension    Obesity    C. 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)     DIABETIC: Yes  CODE STATUS: Full Code  ALLERGIES:  Allergies   Allergen Reactions    Hydrocodone     Ibuprofen      DIET: DIET DIABETIC/ADA Low phosphorus  ACCESS:      NEEDLE SIZE(G): 15  WT(KG): 115.3      EDW: 114    UFG(L): 1-3  PRE TX NURSE REPORT FROM/TIME:  Courtney Heys RN [phone]  TIME OUT TIME/SAFETY CHECKS: Violet Baldy LPN  [id number]  HEPATITIS STATUS: Immune  HBSAG   DATE: 08/15/2021       RESULTS: Negative  HBSAB   DATE:  04/11/2021     RESULTS: >10 Immune  MOBILITY: BED  1ST TIME ACUTE:      STAT:     ROUTINE:  Yes MWF    URGENT:  ACUTE ROOM: Yes     BEDSIDE:     ICU:  ISOLATION PRECAUTION:  DIALYSIS: Yes      AIR[place]ORNE:     CONTACT:      REVERSE:      DROPLET:  SPECIAL CONSIDERATIONS: None  TOTAL CHLORINE < 0.1PPM  TIME: 0745         2ND CHECK TIME: 1145  TREATMENT INITIATION: 0947  BATH K/CA:  2K/3Ca       BFR/DFR: 400/700     NA:  137      HCO3: 38  ISOLATED UF ONLY: No  MACHINE S/N:  [id number]  RO MAIN:  Yes   PORTABLE S/N: No  ALARM TEST PASS TIME: 0841  BLEACH RESIDUAL NEGATIVE: Yes  MINNICARE RESIDUAL NEGATIVE: Yes  RO/MACHINE LOG COMPLETED: Yes  EXTRACORPOREAL CIRCUIT TESTED FOR INTEGRITY: Yes  ALL CONNECTIONS SECURED: Yes  SALINE LINE DOUBLE CLAMPED: Yes  VENOUS/ARTERIAL PARAMETERS SET: Yes  PRIME GIVEN: 250 MLS: Yes  AIR FOAM DETECTOR ENGAGED: Yes  INCAPACITATED NURSE EDUCATION COMPLETED:       MULTI-SUITE:  Yes  NA:  MD NOTIFIED AT THE START: Dr Conan Bowens       09/14/21 0925   Machine Checks   Safety Checks P   Machine Number 3   Machine Temp 36 C (96.8 F)   Alarms Set Yes (Pre-Set)   Conductivity 13.5   Independent Conductivity 13.8   Bleach -   Independent pH 7.2   Dialysate Used: premixed hemodialysate (NATURALYTE) 3.43 L;with potassium chloride 2 mEq/L;with calcium chloride 3 mEq/L   Dialyzer Used: Nipro 15H   Med Being Verified heparin   Med Verification (1st) Clinician Verification of Medication/Blood   Med Verification (2nd) Second Clinician Verification of Medication/Blood   Additional Pre- Dialysis Documentation   Hepatitis B Status Checked: Negative Surface Antigen;Immunity   Hepatitis B Date Drawn 08/15/21   Pre-Dialysis Safety Checks Yes   Total Chlorine <0.1   Dialysis Prescription   Order Confirmed Yes   Pre Dialysis Vitals   Weight (Pre Dialysis) 115 kg (254 lb 3.1 oz)   Weight Source Bed   Temp 36 C (96.8  F)   BP (Non-Invasive) (!) 198/101   BP Source (Non-Invasive) C   Heart Rate 79   SpO2 96 %  (RA)   Pre Dialysis Assessment   Patient Status Arrived;Report Received;Stable   Cardiac Pulse Regular   Respiratory Lungs Clear   Mental Status Alert   GI Complaints No Complaint   Fluid Assessment Lower Extremity Edema   Lower Extremity Edema Status Mild   Pain Score 0   Patient Report Received From: Courtney Heys RN [phone]   Vascular Access   Was Catheter Placed in Dialysis? No   Access Site Left Arm;Upper   Access Type AV Fistula   Access Assessment WDL   Needle Size 15 gauge   Access Patent Yes   Dialysis Consent   Consent Obtained Verbal;Written   Date Consent Obtained 09/12/21   Procedure Time Out   Consent Obtained Verbal   Patient Identification (name and date of birth): Patient Verbally Identified Self;Verified Patient Identification Band   Patient Position High Fowlers (greater than 60 degrees)   Procedure Verified? Yes   Time Out Performed By Violet Baldy LPN [id number]      64/68/03 0947 09/14/21 1000 09/14/21 1015   Hemodialysis Monitor   Patient Status Stable  (treatment initiated) Stable  (Pt alert) Stable  (Pt on phone)   Time On 0947  --   --    Vitals and Machine Parameters   BP 117/90 172/50 169/87   Pulse 75 75 74   Blood Flowrate 400 ML/MIN 400 ML/MIN 400 ML/MIN   Arterial Pressure -110 mmHg -130 mmHg -130 mmHg   Venous Pressure 160 mmHg 160 mmHg 190 mmHg   Transmembrane Pressure (TMP) 70 mmHg 60 mmHg 70 mmHg   Ultrafiltration Rate (Dialysis) 890 890 890   Fluids Normal Saline  --   --    IV Fluid Administered Due To:   (priming)  --   --    Fluid Volume 250 mL  --   --    Fluid Goal 3 Kg 3 Kg 3 Kg   Actual Fluid Goal 3.6 Kg 3.6 Kg 3.6 Kg   Actual Fluid Removal 0 mL 270 mL 602 mL   Treatment Total Fluid Removal 0 mL  --   --    Patient Observed Yes Yes Yes   Additional Hemodialysis Documentation   Dialysis Access Visible Yes Yes Yes   Hemodialysis Line Secure Yes Yes Yes   Machine  Serial Number [id number]   --   --    RO Serial Number Main  --   --    Alarm Pass Time 5102  --   --    Dialyzer Lot# [id number] exp 03/24/2024  --   --    Tubing Lot# [id number] exp 01/22/2026  --   --    RO/Machine Log Complete Yes  --   --    All Connections Secured Yes  --   --    Venous/Arterial parameters set Yes  --   --    Prime Given Yes; 250 mL  --   --    Chiropodist engages  Yes  --   --    Saline Line double clamped  Yes  --   --       09/14/21 1030 09/14/21 1045 09/14/21 1100   Hemodialysis Monitor   Patient Status Stable  (Pt awake, no sign of distress) Stable  (Pt resting) Stable  (Pt watching "Momin Misko is Right")   Time On  --   --   --    Vitals and Machine Parameters   BP 176/85 171/86 165/82   Pulse 72 75 76   Blood Flowrate 400 ML/MIN 400 ML/MIN 400 ML/MIN   Arterial Pressure -130 mmHg -140 mmHg -140 mmHg   Venous Pressure 200 mmHg 180 mmHg 200 mmHg   Transmembrane Pressure (TMP) 50 mmHg 60 mmHg 60 mmHg   Ultrafiltration Rate (Dialysis) 890 890 890   Fluids  --   --   --    IV Fluid Administered Due To:  --   --   --    Fluid Volume  --   --   --    Fluid Goal 3 Kg 3 Kg 3 Kg   Actual Fluid Goal 3.6 Kg 3.6 Kg 3.6 Kg   Actual Fluid Removal 759 mL 948 mL 1222 mL   Treatment Total Fluid Removal  --   --   --    Patient Observed Yes Yes Yes   Additional Hemodialysis Documentation   Dialysis Access Visible Yes Yes Yes   Hemodialysis Line Secure Yes Yes Yes   Machine Serial Number  --   --   --    RO Serial Number  --   --   --    Alarm Pass Time  --   --   --    Dialyzer Lot#  --   --   --    Tubing Lot#  --   --   --    RO/Machine Log Complete  --   --   --    All Connections Secured  --   --   --    Venous/Arterial parameters set  --   --   --    Prime Given  --   --   --    Chiropodist engages   --   --   --    Saline Line double clamped   --   --   --       09/14/21 1115 09/14/21 1130 09/14/21 1145   Hemodialysis Monitor   Patient Status Stable  (Internal med dr rounding, speaks with pt) Stable  (Pt watching tv)  Stable  (Pt watching tv)   Vitals and Machine Parameters   BP 166/79 175/73 181/81   Pulse 74 78 87   Blood Flowrate 400 ML/MIN 400 ML/MIN 400 ML/MIN   Arterial Pressure -140 mmHg -140  mmHg -140 mmHg   Venous Pressure 190 mmHg 200 mmHg 190 mmHg   Transmembrane Pressure (TMP) 60 mmHg 50 mmHg 60 mmHg   Ultrafiltration Rate (Dialysis) 890 890 890   Fluids  --   --   --    IV Fluid Administered Due To:  --   --   --    Fluid Volume  --   --   --    Fluid Goal 3 Kg 3 Kg 3 Kg   Actual Fluid Goal 3.6 Kg 3.6 Kg 3.6 Kg   Actual Fluid Removal 1432 mL 1649 mL 1800 mL   Patient Observed Yes Yes Yes   Additional Hemodialysis Documentation   Dialysis Access Visible Yes Yes Yes   Hemodialysis Line Secure Yes Yes Yes   Machine Serial Number  --   --  `      09/14/21 1200 09/14/21 1215 09/14/21 1230   Hemodialysis Monitor   Patient Status Stable  (c/o severe cramping R leg.  UF paused.  100 mL NS bolus administered.  UFG reduced.) Stable  (Pt states L cramping relieved) Stable  (Pt awake, talking on cell phone)   Vitals and Machine Parameters   BP 198/80 142/89 159/77   Pulse 77 67 75   Blood Flowrate 400 ML/MIN 400 ML/MIN 400 ML/MIN   Arterial Pressure -150 mmHg -150 mmHg -150 mmHg   Venous Pressure 190 mmHg 190 mmHg 200 mmHg   Transmembrane Pressure (TMP) 60 mmHg 40 mmHg 50 mmHg   Ultrafiltration Rate (Dialysis) 360 360 360   Fluids Normal Saline Other (Comment)  (Ferrelicit)  --    IV Fluid Administered Due To: Cramping Abnormal Lab Values  --    Fluid Volume 100 mL 100 mL  --    Fluid Goal 2 Kg 2 Kg 2 Kg   Actual Fluid Goal 2.7 Kg 2.7 Kg 2.7 Kg   Actual Fluid Removal 2049 mL 2163 mL 2292 mL   Patient Observed Yes Yes Yes   Additional Hemodialysis Documentation   Dialysis Access Visible Yes Yes Yes   Hemodialysis Line Secure Yes Yes Yes   Machine Serial Number  --   --   --       09/14/21 1245 09/14/21 1300 09/14/21 1315   Hemodialysis Monitor   Patient Status Stable  (Pt awake, no sign of distress.) Stable Stable  (Pt talking on  cell phone)   Vitals and Machine Parameters   BP 176/67 171/78 184/86   Pulse 83 73 75   Blood Flowrate 400 ML/MIN 400 ML/MIN 400 ML/MIN   Arterial Pressure -160 mmHg -160 mmHg -150 mmHg   Venous Pressure 200 mmHg 200 mmHg 200 mmHg   Transmembrane Pressure (TMP) 50 mmHg 50 mmHg 50 mmHg   Ultrafiltration Rate (Dialysis) 360 360 360   Fluids  --   --   --    IV Fluid Administered Due To:  --   --   --    Fluid Volume  --   --   --    Fluid Goal 2 Kg 2 Kg 2 Kg   Actual Fluid Goal 2.7 Kg 2.7 Kg 2.7 Kg   Actual Fluid Removal 2375 mL 2475 mL 2522 mL   Treatment Total Fluid Removal  --   --   --    Total Blood Product Removed  --   --   --    Patient Observed Yes Yes Yes   Additional Hemodialysis Documentation   Dialysis Access Visible Yes  Yes Yes   Hemodialysis Line Secure Yes Yes Yes      09/14/21 1330 09/14/21 1345 09/14/21 1348   Hemodialysis Monitor   Patient Status Stable  (Pt alert, no sign of distress) Stable  (Pt alert) Stable  (end of treatment)   Vitals and Machine Parameters   BP 181/84 174/86 181/81   Pulse 76 75 80   Blood Flowrate 400 ML/MIN 400 ML/MIN 400 ML/MIN   Arterial Pressure -150 mmHg -140 mmHg -150 mmHg   Venous Pressure 200 mmHg 200 mmHg 200 mmHg   Transmembrane Pressure (TMP) 50 mmHg 50 mmHg 50 mmHg   Ultrafiltration Rate (Dialysis) 360 360 360   Fluids  --   --  Normal Saline   IV Fluid Administered Due To:  --   --  Standard Rinseback   Fluid Volume  --   --  250 mL   Fluid Goal 2 Kg 2 Kg 2 Kg   Actual Fluid Goal 2.7 Kg 2.7 Kg 2.7 Kg   Actual Fluid Removal 2610 mL 2685 mL 2700 mL   Treatment Total Fluid Removal  --   --  2000 mL   Total Blood Product Removed  --   --  0 ml   Patient Observed Yes Yes Yes   Additional Hemodialysis Documentation   Dialysis Access Visible Yes Yes Yes   Hemodialysis Line Secure Yes Yes Yes      09/14/21 1403   Post Dialysis Vitals   Weight (Post Dialysis) 113 kg (249 lb 12.5 oz)   Weight Source Bed   Temp 36 C (96.8 F)   Temp Source Thermoscan   BP 173/80   BP  Source (Non-Invasive) C   Pulse 79   RR 20   End Treatment Kt/V 1.04   Post Dialysis Assessment   Time Off 1350   $ Treatment Completed Yes   Cardiac Pulse Regular   Respiratory Lungs Clear   Post Vascular Assessment bruit audible, strong;thrill palpable, strong   Dialyzer Appearance Streaked <50%   Dialysis Outcome Goal Met   Post Hemodialysis Patient Status Report Given;Stable     MEDICATIONS/BLOOD PRODUCTS: heparin 1000 units per hour, Aranesp 60 mcg, Carnitor 2956 mg, Ferrelicit 213 mg in NS 086 mL IVPB    EDUCATION: Patient: Yes   Others:  Knowledge Base:  Substantial: Yes  Minimal:   None:  Barriers to Learning:         None: Yes  Teaching Method:   Oral: Yes  Written:  Visual:    Hands On/ Demo:  Topic:  Access Care: Yes   S&S of Infection:   Fluid Management:   K+:   Procedural: Yes  Albumin:   Medications: YEs Tx Options:   Transplant:    Diet:   Others:  Patient Response:  Verbalized Understanding: Yes  Demonstrate Understanding:   Teach Back:    Return Demo:   No Evidence for Learning:  Requires Follow Up: Yes  None/N/A:    POST TX NOTES: Pt c/o severe leg cramping, relieved by single NS bolus and UFG reduction.  Treatment goals met and otherwise well tolerated.    POST TX ACCESS:  AVF/AVG: BLEEDING STOP ART__7__MIN  VEN__5__MIN   +BRUIT/THRILL: Yes  CATHETER LOCKING SOLUTION:  HEPARIN 1000 UNITS/ML:    NA CITRATE:   SALINE FLUSH(10MLS EACH):    CATHFLO:  VOLUME ART____ML    VEN____ML                                                                  CLAMPED AND CAPPED:    ACCESS FLOW: Good: Yes   Poor:   Positional:   Reversed:   Clotted:  BVP_89.6___L  KT/V_1.04___  NET UF_2000___ML  PRIMARY NURSE REPORT POST TX:    Courtney Heys RN   TIME: [phone]

## 2021-09-14 NOTE — Nurses Notes (Signed)
Patient went to dialysis from 0900-1400.  Echo completed at 1500.  Patient is alert and orientated.  No issues with dialysis today. Removed 3L today.  Patient went to the restroom independently.  Daughter and spouse were here at bedside.  Patient anticipates going home tomorrow.   Patient also moves in room independently from bed, chair and restroom.  Blood pressure was elevated before dialysis, 169/73, HR 76.  1700 vitals show blood pressure 139/50, HR 83.  Patient does not complain of headache, dizziness, or pain.  Morning medications were given at 1433 due to dialysis treatment.

## 2021-09-14 NOTE — Nurses Notes (Signed)
Patient is due for HD today.  AM BP med held.  She had an uneventful night.

## 2021-09-14 NOTE — Ancillary Notes (Signed)
Echo completed at bedside. Definity given and administered by myself.      [place], [place]  09/14/2021, 15:04

## 2021-09-14 NOTE — Pharmacy (Signed)
[facility]  [place], [place] [address]  Pharmacy Chart Review    [name]  Oct 12, 1965  [id number]    For this patient, I have reviewed their medication profile and have provided some recommendations.    Please consider the following changes:    Patient has been treated with ceftriaxone and doxycyline for possible respiratory infection. Consider placing a stop date of 7 days on the rocephin order.    [facility], [facility]  09/14/2021, 12:39                                 The chart was reviewed by antimicrobial stewardship group. Patient has not been seen by the group.

## 2021-09-15 MED ORDER — CEFUROXIME AXETIL 500 MG TABLET
500.0000 mg | ORAL_TABLET | Freq: Two times a day (BID) | ORAL | 0 refills | Status: DC
Start: 2021-09-15 — End: 2021-10-13

## 2021-09-15 MED ORDER — BENZONATATE 100 MG CAPSULE
100.0000 mg | ORAL_CAPSULE | Freq: Three times a day (TID) | ORAL | 0 refills | Status: DC
Start: 2021-09-15 — End: 2021-10-13

## 2021-09-15 MED ORDER — DOXYCYCLINE HYCLATE 100 MG TABLET
100.0000 mg | ORAL_TABLET | Freq: Two times a day (BID) | ORAL | 0 refills | Status: AC
Start: 2021-09-15 — End: 2021-09-17

## 2021-09-15 NOTE — Nurses Notes (Signed)
No new variance this shift.  Patient is in stable  Condition and is looking forward to going home today.

## 2021-09-15 NOTE — Nurses Notes (Signed)
Patient with VSS, AOx4, and no complaints refused wheelchair at discharge and ambulated off floor accompanied by spouse.

## 2021-09-15 NOTE — Care Management Notes (Signed)
Did place a call to [place] spoke with [place] Reader just to provide the update that the patient is discharging home today. Patient's OP hemodialysis is M-W-F with a chair time of 1330.

## 2021-09-16 LAB — ADULT ROUTINE BLOOD CULTURE, SET OF 2 BOTTLES (BACTERIA AND YEAST)
BLOOD CULTURE, ROUTINE: NO GROWTH
BLOOD CULTURE, ROUTINE: NO GROWTH

## 2021-09-21 ENCOUNTER — Other Ambulatory Visit (INDEPENDENT_AMBULATORY_CARE_PROVIDER_SITE_OTHER): Payer: Self-pay | Admitting: Urology

## 2021-09-21 ENCOUNTER — Inpatient Hospital Stay
Admission: RE | Admit: 2021-09-21 | Discharge: 2021-09-21 | Disposition: A | Discharge status: Home / Self Care | Payer: 59 | Source: Ambulatory Visit | Attending: Urology | Admitting: Urology

## 2021-09-21 ENCOUNTER — Other Ambulatory Visit: Payer: Self-pay

## 2021-09-21 DIAGNOSIS — C642 Malignant neoplasm of left kidney, except renal pelvis: Secondary | ICD-10-CM

## 2021-09-22 ENCOUNTER — Encounter (INDEPENDENT_AMBULATORY_CARE_PROVIDER_SITE_OTHER): Payer: 59 | Admitting: Urology

## 2021-09-28 NOTE — Discharge Summary (Signed)
[facility]    DISCHARGE SUMMARY      PATIENT NAME:  [name], [name]  MRN:  [id number]  DOB:  [birth year]    ENCOUNTER START DATE:  09/11/2021  INPATIENT ADMISSION DATE: 09/12/2021  DISCHARGE DATE:  09/15/2021    ATTENDING PHYSICIAN: Dr. Leota Jacobsen  PRIMARY CARE PHYSICIAN: Ocie Doyne, NP     ADMISSION DIAGNOSIS: Volume overload  Chief Complaint   Patient presents with   . Shortness of Breath       DISCHARGE DIAGNOSIS:     Principal Problem:  Volume overload    Active Hospital Problems    Diagnosis Date Noted   . Principle Problem: Volume overload [E87.70] 09/11/2021   . 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 Essential hypertension [I10] 11/30/2014   . Type 2 diabetes mellitus (CMS [place]) [E11.9] 10/25/2014      Resolved Hospital Problems   No resolved problems to display.     Active Non-Hospital Problems    Diagnosis Date Noted   . Elevated troponin I level 05/25/2021   . History of kidney disease    . Cancer (CMS [place])    . Fluid overload 10/16/2020   . CKD (chronic kidney disease), stage V (CMS [facility]) 08/03/2020   . Persistent vomiting 08/20/2019   . Morbid obesity 10/24/2017   . 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 Hyperkalemia 10/08/2017   . Slow transit constipation 10/08/2017   . C. difficile diarrhea 12/02/2014   . Obesity 11/30/2014   . Cellulitis of foot, left 11/29/2014   . Diabetic foot infection (CMS [place]) 10/24/2014      Patient Active Problem List   Diagnosis Code   . Diabetic foot infection (CMS HCC) E11.628, L08.9   . Type 2 diabetes mellitus (CMS HCC) E11.9   . Cellulitis of foot, left L03.116   . Essential hypertension I10   . Obesity E66.9   . C. difficile diarrhea A04.72   . Hyperkalemia E87.5   . Slow transit constipation K59.01   . 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 Morbid obesity E66.01   . Persistent vomiting R11.15   . CKD (chronic kidney disease), stage V (CMS HCC) N18.5   . Fluid overload E87.70   . History of kidney  disease Z87.448   . Cancer (CMS HCC) C80.1   . Elevated troponin I level R77.8   . Volume overload E87.70   . ESRD on dialysis (CMS HCC) N18.6, Z99.2       Allergies   Allergen Reactions   . Hydrocodone    . Ibuprofen             DISCHARGE MEDICATIONS:     Current Discharge Medication List      START taking these medications.      Details   cefuroxime 500 mg Tablet  Commonly known as: CEFTIN   500 mg, Oral, 2 TIMES DAILY  Qty: 3 Tablet  Refills: 0        CONTINUE these medications which have CHANGED during your visit.      Details   losartan 25 mg Tablet  Commonly known as: COZAAR  What changed:    when to take this   additional instructions   25 mg, Oral, DAILY  Qty: 30 Tablet  Refills: 0        CONTINUE these medications - NO CHANGES were made during your visit.  Details   albuterol sulfate 90 mcg/actuation oral inhaler  Commonly known as: PROVENTIL or VENTOLIN or PROAIR   2 Puffs, Inhalation, EVERY 4 HOURS PRN  Qty: 2 Inhaler  Refills: 0     ALPRAZolam 0.25 mg Tablet  Commonly known as: XANAX   0.25 mg, Oral, DAILY PRN  Refills: 0     atorvastatin 20 mg Tablet  Commonly known as: LIPITOR   20 mg, Oral, EVERY EVENING  Refills: 0     cyclobenzaprine 5 mg Tablet  Commonly known as: FLEXERIL   5 mg, Oral, 3 TIMES DAILY PRN  Refills: 0     escitalopram oxalate 10 mg Tablet  Commonly known as: LEXAPRO   10 mg, Oral, NIGHTLY  Refills: 0     Fish Oil-Omega-3 Fatty Acids 360-1,200 mg Capsule   2 Capsules, Oral, 2 TIMES DAILY  Refills: 0     fluticasone propionate 50 mcg/actuation Spray, Suspension  Commonly known as: FLONASE   1 Spray, Each Nostril, DAILY PRN  Refills: 0     Januvia 50 mg Tablet  Generic drug: SITagliptin phosphate   50 mg, Oral, EVERY MORNING  Refills: 0     labetaloL 200 mg Tablet  Commonly known as: NORMODYNE   200 mg, Oral, EVERY 8 HOURS (SCHEDULED)  Qty: 90 Tablet  Refills: 0     loperamide 2 mg Capsule  Commonly known as: IMODIUM   4 mg, Oral, EVERY 6 HOURS PRN  Refills: 0     loratadine 10 mg  Tablet  Commonly known as: CLARITIN   10 mg, Oral, DAILY PRN  Refills: 0     traMADoL 50 mg Tablet  Commonly known as: ULTRAM   50 mg, Oral, EVERY 6 HOURS PRN  Qty: 30 Tablet  Refills: 0     vitamin E 100 unit Capsule   100 Units, Oral, 2 TIMES DAILY  Refills: 0        STOP taking these medications.    ZITHROMAX Z-PAK ORAL        ASK your doctor about these medications.      Details   benzonatate 100 mg Capsule  Commonly known as: TESSALON  Ask about: Should I take this medication?   100 mg, Oral, 3 TIMES DAILY  Qty: 15 Capsule  Refills: 0     doxycycline 100 mg Tablet  Ask about: Should I take this medication?   100 mg, Oral, 2 TIMES DAILY  Qty: 3 Tablet  Refills: 0            DISCHARGE INSTRUCTIONS:      ASPIRIN NOT ORDERED AT THIS TIME      Follow-up Information     Ocie Doyne, NP. Call in 1 day(s).    Specialty: NURSE PRACTITIONER  Why: PLEASE give the office a call, to schedule your hospital follow up appointment  Contact information:  [place] [address]  [phone]                         REASON FOR HOSPITALIZATION AND HOSPITAL COURSE:  This is a 56 y.o., female admitted with vomiting, generalized weakness, cough, SOB. PMH - ESRD on HD, h/o RCC s/p nephrectomy, DM2, HTN. Diagnosed with Community-acquired pneumoniaESRD on HD, history of renal cell cancer status post unilateral nephrectomy   Chest x-ray with central congestion and interstitial prominence.  Elevated troponin - likely type 2 MI 2/2 PNA and accumulation from ESRD. Currently no  active ACS symptoms, labs few weeks back with similar elevation. ECHO normal. Improved with IV abx.   Stable for discharge.  Total time spent 45 mins.         AVS reviewed with patient/care giver.  A written copy of the AVS and discharge instructions given to the patient/care giver. Patient/Family was in agreement, endorsed understanding, and all questions were answered.     SIGNIFICANT PHYSICAL FINDINGS:  General:AAO x3 ; NAD BMI  39  Eyes:Conjunctiva and sclera clear. Bilateral EOM intact.   HENT:Normocephalic, atraumatic.   Cardiovascular:Regular rate and rhythm. Normal S1, S2. No murmurs, rubs, or gallops.   Respiratory:Clear to auscultation bilaterally. No rales, rhonchi, or wheezes. No use of accessory muscles. Normal work of breathing.   Gastrointestinal: Soft,  nondistended, nontender  Musculoskeletal: No obvious deformity  Neurological:AAO x 3. No focal deficits. Moving all extremities and following commands.   Integumentary/Skin:No erythema or rash.  Psychiatric:Cooperative with exam. Not agitated. Normal affect.      COURSE IN HOSPITAL: as above    DOES PATIENT HAVE ADVANCED DIRECTIVES:  Yes    ADVANCED CARE PLANNING - Not applicable for this patient    CONDITION ON DISCHARGE: Stable    DISCHARGE DISPOSITION:  Home discharge     Copies sent to Care Team       Relationship Specialty Notifications Start End    Ocie Doyne, NP PCP - General EXTERNAL  08/15/19     Phone: [phone] Fax: [phone]         [address] [place] [address]

## 2021-10-04 ENCOUNTER — Encounter (INDEPENDENT_AMBULATORY_CARE_PROVIDER_SITE_OTHER): Payer: 59 | Admitting: Urology

## 2021-10-13 ENCOUNTER — Ambulatory Visit (INDEPENDENT_AMBULATORY_CARE_PROVIDER_SITE_OTHER): Payer: 59 | Admitting: Urology

## 2021-10-13 ENCOUNTER — Encounter (INDEPENDENT_AMBULATORY_CARE_PROVIDER_SITE_OTHER): Payer: Self-pay | Admitting: Urology

## 2021-10-13 ENCOUNTER — Other Ambulatory Visit: Payer: Self-pay

## 2021-10-13 ENCOUNTER — Other Ambulatory Visit (HOSPITAL_COMMUNITY)
Admission: RE | Admit: 2021-10-13 | Discharge: 2021-10-13 | Disposition: A | Discharge status: Home / Self Care | Payer: BC Managed Care – PPO | Source: Ambulatory Visit | Attending: Advanced Practice Midwife | Admitting: Advanced Practice Midwife

## 2021-10-13 ENCOUNTER — Encounter: Payer: Self-pay | Admitting: Advanced Practice Midwife

## 2021-10-13 ENCOUNTER — Ambulatory Visit: Payer: BC Managed Care – PPO | Admitting: Advanced Practice Midwife

## 2021-10-13 VITALS — BP 149/79 | HR 74 | Temp 96.9°F | Ht 67.0 in | Wt 247.0 lb

## 2021-10-13 VITALS — BP 140/80 | Ht 69.0 in | Wt 202.0 lb

## 2021-10-13 DIAGNOSIS — Z1272 Encounter for screening for malignant neoplasm of vagina: Secondary | ICD-10-CM

## 2021-10-13 DIAGNOSIS — N951 Menopausal and female climacteric states: Secondary | ICD-10-CM

## 2021-10-13 DIAGNOSIS — Z1239 Encounter for other screening for malignant neoplasm of breast: Secondary | ICD-10-CM

## 2021-10-13 DIAGNOSIS — Z85528 Personal history of other malignant neoplasm of kidney: Secondary | ICD-10-CM

## 2021-10-13 DIAGNOSIS — Z992 Dependence on renal dialysis: Secondary | ICD-10-CM

## 2021-10-13 DIAGNOSIS — C642 Malignant neoplasm of left kidney, except renal pelvis: Secondary | ICD-10-CM

## 2021-10-13 DIAGNOSIS — E611 Iron deficiency: Secondary | ICD-10-CM

## 2021-10-13 MED ORDER — ESTRADIOL 0.025 MG/24HR TD PTWK: 0.0250 mg | MEDICATED_PATCH | TRANSDERMAL | 11 refills | Status: DC

## 2021-10-13 NOTE — Patient Instructions (Signed)
Health Maintenance, Female °Adopting a healthy lifestyle and getting preventive care are important in promoting health and wellness. Ask your health care provider about: °The right schedule for you to have regular tests and exams. °Things you can do on your own to prevent diseases and keep yourself healthy. °What should I know about diet, weight, and exercise? °Eat a healthy diet ° °Eat a diet that includes plenty of vegetables, fruits, low-fat dairy products, and lean protein. °Do not eat a lot of foods that are high in solid fats, added sugars, or sodium. °Maintain a healthy weight °Body mass index (BMI) is used to identify weight problems. It estimates body fat based on height and weight. Your health care provider can help determine your BMI and help you achieve or maintain a healthy weight. °Get regular exercise °Get regular exercise. This is one of the most important things you can do for your health. Most adults should: °Exercise for at least 150 minutes each week. The exercise should increase your heart rate and make you sweat (moderate-intensity exercise). °Do strengthening exercises at least twice a week. This is in addition to the moderate-intensity exercise. °Spend less time sitting. Even light physical activity can be beneficial. °Watch cholesterol and blood lipids °Have your blood tested for lipids and cholesterol at 54 years of age, then have this test every 5 years. °Have your cholesterol levels checked more often if: °Your lipid or cholesterol levels are high. °You are older than 54 years of age. °You are at high risk for heart disease. °What should I know about cancer screening? °Depending on your health history and family history, you may need to have cancer screening at various ages. This may include screening for: °Breast cancer. °Cervical cancer. °Colorectal cancer. °Skin cancer. °Lung cancer. °What should I know about heart disease, diabetes, and high blood pressure? °Blood pressure and heart  disease °High blood pressure causes heart disease and increases the risk of stroke. This is more likely to develop in people who have high blood pressure readings or are overweight. °Have your blood pressure checked: °Every 3-5 years if you are 18-39 years of age. °Every year if you are 40 years old or older. °Diabetes °Have regular diabetes screenings. This checks your fasting blood sugar level. Have the screening done: °Once every three years after age 40 if you are at a normal weight and have a low risk for diabetes. °More often and at a younger age if you are overweight or have a high risk for diabetes. °What should I know about preventing infection? °Hepatitis B °If you have a higher risk for hepatitis B, you should be screened for this virus. Talk with your health care provider to find out if you are at risk for hepatitis B infection. °Hepatitis C °Testing is recommended for: °Everyone born from 1945 through 1965. °Anyone with known risk factors for hepatitis C. °Sexually transmitted infections (STIs) °Get screened for STIs, including gonorrhea and chlamydia, if: °You are sexually active and are younger than 54 years of age. °You are older than 54 years of age and your health care provider tells you that you are at risk for this type of infection. °Your sexual activity has changed since you were last screened, and you are at increased risk for chlamydia or gonorrhea. Ask your health care provider if you are at risk. °Ask your health care provider about whether you are at high risk for HIV. Your health care provider may recommend a prescription medicine to help prevent HIV   infection. If you choose to take medicine to prevent HIV, you should first get tested for HIV. You should then be tested every 3 months for as long as you are taking the medicine. Pregnancy If you are about to stop having your period (premenopausal) and you may become pregnant, seek counseling before you get pregnant. Take 400 to 800  micrograms (mcg) of folic acid every day if you become pregnant. Ask for birth control (contraception) if you want to prevent pregnancy. Osteoporosis and menopause Osteoporosis is a disease in which the bones lose minerals and strength with aging. This can result in bone fractures. If you are 54 years old or older, or if you are at risk for osteoporosis and fractures, ask your health care provider if you should: Be screened for bone loss. Take a calcium or vitamin D supplement to lower your risk of fractures. Be given hormone replacement therapy (HRT) to treat symptoms of menopause. Follow these instructions at home: Alcohol use Do not drink alcohol if: Your health care provider tells you not to drink. You are pregnant, may be pregnant, or are planning to become pregnant. If you drink alcohol: Limit how much you have to: 0-1 drink a day. Know how much alcohol is in your drink. In the [place] one drink equals one 12 oz bottle of beer (355 mL), one 5 oz glass of wine (148 mL), or one 1 oz glass of hard liquor (44 mL). Lifestyle Do not use any products that contain nicotine or tobacco. These products include cigarettes, chewing tobacco, and vaping devices, such as e-cigarettes. If you need help quitting, ask your health care provider. Do not use street drugs. Do not share needles. Ask your health care provider for help if you need support or information about quitting drugs. General instructions Schedule regular health, dental, and eye exams. Stay current with your vaccines. Tell your health care provider if: You often feel depressed. You have ever been abused or do not feel safe at home. Summary Adopting a healthy lifestyle and getting preventive care are important in promoting health and wellness. Follow your health care provider's instructions about healthy diet, exercising, and getting tested or screened for diseases. Follow your health care provider's instructions on monitoring your  cholesterol and blood pressure. This information is not intended to replace advice given to you by your health care provider. Make sure you discuss any questions you have with your health care provider. Document Revised: 01/31/2021 Document Reviewed: 01/31/2021 Elsevier Patient Education  2022 [employer] Menopause and Hormone Replacement Therapy Menopause is a normal time of life when menstrual periods stop completely and the ovaries stop producing the female hormones estrogen and progesterone. Low levels of these hormones can affect your health and cause symptoms. Hormone replacement therapy (HRT) can relieve some of those symptoms. HRT is the use of artificial (synthetic) hormones to replace hormones that your body has stopped producing because you have reached menopause. Types of HRT HRT may consist of the synthetic hormones estrogen and progestin, or it may consist of estrogen-only therapy. You and your health care provider will decide which form of HRT is best for you. If you choose to be on HRT and you have a uterus, estrogen and progestin are usually prescribed. Estrogen-only therapy is used for women who do not have a uterus. Possible options for taking HRT include: Pills. Patches. Gels. Sprays. Vaginal cream. Vaginal rings. Vaginal inserts. The amount of hormones that you take and how long you take them varies according  to your health. It is important to: Begin HRT with the lowest possible dosage. Stop HRT as soon as your health care provider tells you to stop. Work with your health care provider so that you feel informed and comfortable with your decisions. Tell a health care provider about: Any allergies you have. Whether you have had blood clots or know of any risk factors you may have for blood clots. Whether you or family members have had cancer, especially cancer of the breasts, ovaries, or uterus. Any surgeries you have had. All medicines you are taking, including  vitamins, herbs, eye drops, creams, and over-the-counter medicines. Whether you are pregnant or may be pregnant. Any medical conditions you have. What are the benefits? HRT can reduce the frequency and severity of menopausal symptoms. Benefits of HRT vary according to the kind of symptoms that you have, how severe they are, and your overall health. HRT may help to improve the following symptoms of menopause: Hot flashes and night sweats. These are sudden feelings of heat that spread over the face and body. The skin may turn red, like a blush. Night sweats are hot flashes that happen while you are sleeping or trying to sleep. Bone loss (osteoporosis). The body loses calcium more quickly after menopause, causing the bones to become weaker. This can increase the risk for bone breaks (fractures). Vaginal dryness. The lining of the vagina can become thin and dry, which can cause pain during sex or cause infection, burning, or itching. Urinary tract infections. Urinary incontinence. This is the inability to control when you urinate. Irritability. Short-term memory problems. What are the risks? Risks of HRT vary depending on your individual health and medical history. Risks of HRT also depend on whether you receive both estrogen and progestin or you receive estrogen only. HRT may increase the risk of: Spotting. This is when a small amount of blood leaks from the vagina unexpectedly. Endometrial cancer. This cancer is in the lining of the uterus (endometrium). Breast cancer. Increased density of breast tissue. This can make it harder to find breast cancer on a breast X-ray (mammogram). Stroke. Heart disease. Blood clots. Gallbladder disease or liver disease. Risks of HRT can increase if you have any of the following conditions: Endometrial cancer. Liver disease. Heart disease. Breast cancer. History of blood clots. History of stroke. Follow these instructions at home: Pap tests Have Pap tests  done as often as told by your health care provider. A Pap test is sometimes called a Pap smear. It is a screening test that is used to check for signs of cancer of the cervix and vagina. A Pap test can also identify the presence of infection or precancerous changes. Pap tests may be done: Every 3 years, starting at age 67. Every 5 years, starting after age 63, in combination with testing for human papillomavirus (HPV). More often or less often depending on other medical conditions you have, your age, and other risk factors. It is up to you to get the results of your Pap test. Ask your health care provider, or the department that is doing the test, when your results will be ready. General instructions Take over-the-counter and prescription medicines only as told by your health care provider. Do not use any products that contain nicotine or tobacco. These products include cigarettes, chewing tobacco, and vaping devices, such as e-cigarettes. If you need help quitting, ask your health care provider. Get mammograms, pelvic exams, and medical checkups as often as told by your health care  provider. Keep all follow-up visits. This is important. Contact a health care provider if you have: Pain or swelling in your legs. Lumps or changes in your breasts or armpits. Pain, burning, or bleeding when you urinate. Unusual vaginal bleeding. Dizziness or headaches. Pain in your abdomen. Get help right away if you have: Shortness of breath. Chest pain. Slurred speech. Weakness or numbness in any part of your arms or legs. These symptoms may represent a serious problem that is an emergency. Do not wait to see if the symptoms will go away. Get medical help right away. Call your local emergency services (911 in the [place] Do not drive yourself to the hospital. Summary Menopause is a normal time of life when menstrual periods stop completely and the ovaries stop producing the female hormones estrogen and  progesterone. HRT can reduce the frequency and severity of menopausal symptoms. Risks of HRT vary depending on your individual health and medical history. This information is not intended to replace advice given to you by your health care provider. Make sure you discuss any questions you have with your health care provider. Document Revised: 03/15/2020 Document Reviewed: 03/15/2020 Elsevier Patient Education  2022 [employer].

## 2021-10-13 NOTE — Progress Notes (Signed)
[facility]  [address] 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  Plan for chest abdomen pelvis CT scan.  Since the patient is on dialysis now, we are able to use contrast.  This summer will be 2 years removed from her surgery.      Orders Placed This Encounter   . CT CHEST ABDOMEN PELVIS W IV CONTRAST       Return in about 6 months (around 04/12/2022) for after CT scan.         Chief Complaint:   Chief Complaint   Patient presents with   . Results       History of Present Illness:    56 y.o. female presents for follow up of left nephrectomy for recurrence of clear cell carcinoma. The patient underwentpartial nephrectomy in January 2019. She reports she has been on dialysis since January 21st of last year. She will be meeting with the transplant team at [facility] soon. She follows with Dr. Judeth Horn and reports she has low iron. She was hospitalized in December for volume overload.    MRI Abdomen from 09/21/2021 was negative for malignancy.     She reports she underwent two eye surgeries recently.    Past Medical History:    Past Medical History:   Diagnosis Date   . Anxiety    . Arthritis     hips   . Beta blocker prescribed for left ventricular systolic dysfunction     Labetalol    . Cancer (CMS [place]) 2019, 2021    kidney, Clear Cell Cancer left kidney-S/P partial nephrectomy 2019, then radical nephrectomy 2021   . Cataract     S/P cataract extraction bilaterally    . Cellulitis 10/24/14    left foot   . Cellulitis 10/21/2020    LLE extremity 09/2020-resolved per patient    . Chronic pain     sec. to arthritis-hips   . 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 Constipation    . COVID 07/2019   . Depression    . Diabetes mellitus (CMS [place])    . Diabetes mellitus, type  2 (CMS HCC)    . Diarrhea    . Dyspnea on exertion    . ESRD (end stage renal disease) (CMS [place])    . Essential hypertension    . Exercise intolerance    . Fatty liver    . Fluid overload 08/21/21    Recent hospitalization for fluid overload, HD initiated 10/18/20   . Headache(784.0)    . Hemodialysis patient (CMS [place])     HD Monday-Wednesday-Friday   . 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 History of nephrectomy 03/24/2020    L nephrectomy   . Hx of echocardiogram 10/18/2020    Technically difficult d/t pt. body habitus; Left vent. EF est. 55-60%; mildy calcified aortic vlave leaflets-especially right coroanry cusp; Small mobile density on aortic valve-no aortic valve stenosis or regurg; Concentric remodeling; mild MR; Trace AR; Trileaflet aortic vlave; Nl IVC size wiht <50% insp. collapse;      Marland Kitchen Hyperlipidemia    . Hyperparathyroidism (CMS [place])    .  Hyperthyroidism     Old records states hyperthyroidism, patient denies, states has hyperparathyroidism   . Irritable bowel syndrome    . Macular edema    . Morbid obesity (CMS [place])    . Obesity    . Panic attack    . Peripheral edema     ankles/feet   . Peripheral neuropathy     bilateral feet   . Pneumonia 07/2019; 09/2020    COVID Pneumonia 07/2019; fluid overload/pneumonia with recent hosp. 09/2020   . Shortness of breath     SOB after climbing 8 steps    . Type 2 diabetes mellitus (CMS HCC)    . Wears glasses     reading   . White coat hypertension            Past Surgical History:  Past Surgical History:   Procedure Laterality Date   . AV FISTULA PLACEMENT Left 08/11/2020    LUE brachio cephalic fistula creation   . ECTOPIC PREGNANCY SURGERY  05/24/1998   . HX CATARACT REMOVAL Bilateral 2016   . HX CHOLECYSTECTOMY  09/10/96   . HX HAND SURGERY Right 2017    Trigger finger release 3rd finger   . HX OTHER Right 10/18/2020    tunneled dialysis catheter   . HX PELVIC LAPAROSCOPY  05/24/98    Ectopic pregnancy   . HX SHOULDER SURGERY Left 2017     Procedure for frozen shoulder   . HX TUBAL LIGATION  09/10/2012    Filshie Clips   . HYMENECTOMY  1996   . KIDNEY SURGERY  10/24/17; 03/24/20   . LAPAROSCOPIC NEPHRECTOMY, HAND ASSISTED Left 03/24/2020    Radical   . LAPAROSCOPIC PARTIAL NEPHRECTOMY Left 10/24/2017    hand-assisted   . RENAL BIOPSY, PERCUTANEOUS  03/08/2020    clear cell renal cell carcinoma           Review of Systems:  Constitutional: No fever, chills, or weight loss  GU: As per HPI    Allergies:  Allergies   Allergen Reactions   . Hydrocodone    . Ibuprofen        Home Medications:  Prior to Admission medications    Medication Sig Start Date End Date Taking? Authorizing Provider   albuterol sulfate (PROVENTIL OR VENTOLIN OR PROAIR) 90 mcg/actuation Inhalation HFA Aerosol Inhaler Take 2 Puffs by inhalation Every 4 hours as needed 08/22/19   Justice Rocher, MD   ALPRAZolam Duanne Moron) 0.25 mg Oral Tablet Take 1 Tablet (0.25 mg total) by mouth Once per day as needed for Anxiety 06/03/20   Provider, Historical   atorvastatin (LIPITOR) 20 mg Oral Tablet Take 1 Tablet (20 mg total) by mouth Every evening   Yes Provider, Historical   cyclobenzaprine (FLEXERIL) 5 mg Oral Tablet Take 1 Tablet (5 mg total) by mouth Three times a day as needed for Muscle spasms   Yes Provider, Historical   doxycycline 100 mg Oral Tablet Take 1 Tablet (100 mg total) by mouth Twice daily for 2 days 09/15/21 09/17/21  Charlies Constable, MD   escitalopram oxalate (LEXAPRO) 10 mg Oral Tablet Take 1 Tablet (10 mg total) by mouth Every night   Yes Provider, Historical   Fish Oil-Omega-3 Fatty Acids 360-1,200 mg Oral Capsule Take 2 Capsules by mouth Twice daily   Yes Provider, Historical   fluticasone (FLONASE) 50 mcg/actuation Nasal Spray, Suspension Administer 1 Spray into each nostril Once per day as needed (Allergy symptoms (nasal congestion/ runny nose))   Yes Provider,  Historical   JANUVIA 50 mg Oral Tablet Take 1 Tablet (50 mg total) by mouth Every morning 04/19/20  Yes  Provider, Historical   labetaloL (NORMODYNE) 200 mg Oral Tablet Take 1 Tablet (200 mg total) by mouth Every 8 hours for 30 days Indications: Hold for SBP<120 or HR<55 05/26/21 10/13/21 Yes Garwin Brothers, MD   loperamide (IMODIUM) 2 mg Oral Capsule Take 2 Capsules (4 mg total) by mouth Every 6 hours as needed (Diarrhea)    Provider, Historical   loratadine (CLARITIN) 10 mg Oral Tablet Take 1 Tablet (10 mg total) by mouth Once per day as needed (Allergies)   Yes Provider, Historical   losartan (COZAAR) 25 mg Oral Tablet Take 1 Tablet (25 mg total) by mouth Once a day for 30 days  Patient taking differently: Take 1 Tablet (25 mg total) by mouth Take in the evening on non-dialysis days (Sun/Tues/Thurs/Sat); does not take on dialysis days 03/30/20 10/13/21 Yes Ruta Hinds, MD   traMADoL Veatrice Bourbon) 50 mg Oral Tablet Take 1 Tablet (50 mg total) by mouth Every 6 hours as needed for Pain 10/28/20   Kaylyn Lim, MD   vitamin E 100 unit Oral Capsule Take 1 Capsule (100 Units total) by mouth Twice daily   Yes Provider, Historical   azithromycin (ZITHROMAX Z-PAK ORAL) Take by mouth  09/15/21  Provider, Historical   benzonatate (TESSALON) 100 mg Oral Capsule Take 1 Capsule (100 mg total) by mouth Three times a day for 5 days 09/15/21 10/13/21  Charlies Constable, MD   cefuroxime (CEFTIN) 500 mg Oral Tablet Take 1 Tablet (500 mg total) by mouth Twice daily 09/15/21 10/13/21  Charlies Constable, MD       Social History:  Social History     Tobacco Use   . Smoking status: Never   . Smokeless tobacco: Never   Vaping Use   . Vaping Use: Never used   Substance Use Topics   . Alcohol use: No   . Drug use: No       Family History:  Family Medical History:     Problem Relation (Age of Onset)    Breast Cancer Paternal Aunt    Cancer Maternal Grandmother    Diabetes Father, Paternal Aunt    High Cholesterol Maternal Uncle, Mother    Hypertension (High Blood Pressure) Maternal Uncle, Father            OBJECTIVE:    Vitals:  BP (!) 149/79 (Site: Left,  Patient Position: Sitting, Cuff Size: Adult)   Pulse 74   Temp 36.1 C (96.9 F) (Thermal Scan)   Ht 1.702 m (5\' 7" ) Comment: verbal  Wt 112 kg (247 lb)   LMP 10/24/2014 (Approximate)   BMI 38.69 kg/m         Physical examination:  Constitutional: Appears well, no acute distress.    Respiratory: Non-labored breathing  GI: soft, not distended, no masses.   GU: No CVA tenderness, Bladder Non-tender  Musculoskeletal: Moves all extremities   Psychiatric: Mood and affect normal.     Labs:  Pertinent labs reviewed.    Urine Dip Results:           Imaging studies:  Pertinent imaging studies reviewed.        This note was completed using voice recognition technology. Please excuse any errors that have occurred as a result and contact me if any parts of the note are unclear.      I am in the presence of and scribing for  Dr. Ruta Hinds, MD, for services provided on 10/13/2021.  Caryl Pina, [place]  10/13/2021, 15:35    I personally performed the services described in this documentation, as scribed  in my presence, and it is both accurate  and complete.    Ruta Hinds, MD  10/15/2021, 09:27

## 2021-10-13 NOTE — Assessment & Plan Note (Signed)
There is no evidence of recurrence on recent MRI.  Plan for chest abdomen pelvis CT scan.  Since the patient is on dialysis now, we are able to use contrast.  This summer will be 2 years removed from her surgery.

## 2021-10-17 ENCOUNTER — Encounter: Payer: Self-pay | Admitting: Advanced Practice Midwife

## 2021-10-17 NOTE — Progress Notes (Signed)
Patient ID: Andrea Henry, female   DOB: 07/17/1966, 56 y.o.   MRN: [id number]  Reason for Consult: Gynecologic Exam   Referred by Juline Patch, MD  Subjective:  Date of Service: 10/13/2021  HPI:  Andrea Henry is a 56 y.o. female being seen by referral from her PCP for gyn care. She is due for a PAP smear and thinks her last PAP was 3 or 4 years ago in [place]. She has a history of "partial" hysterectomy and thinks she still has a cervix and part of each ovary. She is also due for a mammogram. Family history includes her mother and a cousin with breast cancer in their 64s. Her main concern today is vasomotor symptoms that she describes as severe and are sleep interrupting. She requests a refill of her previous HRT- estradiol patch 0.025. She mentions some bleeding and pain from hemorroids. She also has incontinence for the past 2 years with laughing/coughing. She has not been doing pelvic floor exercises.   Past Medical History:  Diagnosis Date   Anemia    Anxiety    Depression    Family History  Problem Relation Age of Onset   Hypertension Mother    Cancer Mother    Hypertension Father    Heart disease Father    Stroke Maternal Grandfather    Diabetes Paternal Grandmother    Heart disease Paternal Grandfather    Cancer Paternal Grandfather    Past Surgical History:  Procedure Laterality Date   ABDOMINAL HYSTERECTOMY     BREAST BIOPSY Right    cyst bilateral    Short Social History:  Social History   Tobacco Use   Smoking status: Never   Smokeless tobacco: Never  Substance Use Topics   Alcohol use: Yes    Comment: wine x 6 nights a week    Allergies  Allergen Reactions   Iodine Hives    Current Outpatient Medications  Medication Sig Dispense Refill   estradiol (CLIMARA - DOSED IN MG/24 HR) 0.025 mg/24hr patch Place 1 patch (0.025 mg total) onto the skin once a week. 4 patch 11   No current facility-administered medications for this visit.    Review of Systems  Constitutional:  Negative for chills and fever.  HENT:  Negative for congestion, ear discharge, ear pain, hearing loss, sinus pain and sore throat.   Eyes:  Negative for blurred vision and double vision.  Respiratory:  Negative for cough, shortness of breath and wheezing.   Cardiovascular:  Negative for chest pain, palpitations and leg swelling.  Gastrointestinal:  Negative for abdominal pain, blood in stool, constipation, diarrhea, heartburn, melena, nausea and vomiting.       Positive for hemorrhoids  Genitourinary:  Negative for dysuria, flank pain, frequency, hematuria and urgency.       Positive for urine leakage  Musculoskeletal:  Negative for back pain, joint pain and myalgias.  Skin:  Negative for itching and rash.  Neurological:  Negative for dizziness, tingling, tremors, sensory change, speech change, focal weakness, seizures, loss of consciousness, weakness and headaches.  Endo/Heme/Allergies:  Negative for environmental allergies. Does not bruise/bleed easily.       Positive for hot flashes  Psychiatric/Behavioral:  Negative for depression, hallucinations, memory loss, substance abuse and suicidal ideas. The patient is not nervous/anxious and does not have insomnia.        Objective:  Objective   Vitals:   10/13/21 1348  BP: 140/80  Weight: 202 lb (91.6 kg)  Height: 5\' 9"  (  1.753 m)   Body mass index is 29.83 kg/m. Constitutional: Well nourished, well developed female in no acute distress.  HEENT: normal Skin: Warm and dry. [place] Cardiovascular: Regular rate and rhythm.   Extremity:  no edema   Respiratory: Clear to auscultation bilateral. Normal respiratory effort Neuro: DTRs 2+, Cranial nerves grossly intact Psych: Alert and Oriented x3. No memory deficits. Anxious MS: normal gait, normal bilateral lower extremity ROM/strength/stability.  Pelvic exam:  is not limited by body habitus/is limited by tolerance of exam EGBUS: within normal  limits Vagina: within normal limits and with normal post menopausal mucosa  Cervix: no cervix seen   Assessment/Plan:     56 y.o. G1 P71 female with gyn exam; VMS, vaginal cancer screening, need for breast cancer screening, urge incontinence  Estradiol patch weekly 0.025 mg PAP smear: vaginal cancer screen Referral/order for mammogram  Urge incontinence: start with kegel exercises, follow up as needed Request for previous records    [place] 10/17/2021, 8:24 AM

## 2021-10-18 ENCOUNTER — Telehealth: Payer: Self-pay

## 2021-10-18 LAB — CYTOLOGY - PAP
Comment: NEGATIVE
Diagnosis: NEGATIVE
High risk HPV: NEGATIVE

## 2021-10-18 NOTE — Telephone Encounter (Signed)
Pt calling; saw JEG last week; requested continuation of HRT; rx was written for one patch per week; she has been using 2 patches per week; can this be changed or a new rx sent?  [phone]

## 2021-10-19 ENCOUNTER — Other Ambulatory Visit: Payer: Self-pay | Admitting: Advanced Practice Midwife

## 2021-10-19 DIAGNOSIS — N951 Menopausal and female climacteric states: Secondary | ICD-10-CM

## 2021-10-19 MED ORDER — ESTRADIOL 0.025 MG/24HR TD PTTW: 1.0000 | MEDICATED_PATCH | TRANSDERMAL | 11 refills | Status: AC

## 2021-10-19 NOTE — Progress Notes (Signed)
Rx vivelle 2 patches weekly sent.

## 2021-10-19 NOTE — Telephone Encounter (Signed)
Left detailed msg for pt

## 2021-10-25 ENCOUNTER — Ambulatory Visit: Payer: 59 | Attending: Gerontology | Admitting: Gerontology

## 2021-10-25 ENCOUNTER — Encounter (RURAL_HEALTH_CENTER): Payer: Self-pay | Admitting: Gerontology

## 2021-10-25 VITALS — BP 140/80 | HR 84 | Temp 97.5°F | Resp 16 | Ht 67.0 in | Wt 248.3 lb

## 2021-10-25 DIAGNOSIS — I10 Essential (primary) hypertension: Secondary | ICD-10-CM

## 2021-10-25 DIAGNOSIS — E1121 Type 2 diabetes mellitus with diabetic nephropathy: Secondary | ICD-10-CM

## 2021-10-25 DIAGNOSIS — N186 End stage renal disease: Secondary | ICD-10-CM

## 2021-10-25 DIAGNOSIS — C642 Malignant neoplasm of left kidney, except renal pelvis: Secondary | ICD-10-CM

## 2021-10-25 DIAGNOSIS — E782 Mixed hyperlipidemia: Secondary | ICD-10-CM

## 2021-10-25 DIAGNOSIS — Z992 Dependence on renal dialysis: Secondary | ICD-10-CM

## 2021-10-25 NOTE — Progress Notes (Unsigned)
Subjective:     Patient ID: PAISLEIGH MARONEY is a 56 y.o. female.  Chief Complaint   Patient presents with    [facility] follow up to Pneumonia in December        HPI: Hosp F/o -Admitted for pneumonia in December, [place] [facility]    DM- 6.5, at last check 10/10/2021 at dialysis    ESRD on dialysis-     Hypertension- actively losing weight, staying active    The following portions of the patient's history were reviewed and updated as appropriate: allergies, current medications, past family history, past medical history, past social history, past surgical history and problem list.    Social History     Socioeconomic History    Marital status: Married   Tobacco Use    Smoking status: Never    Smokeless tobacco: Never   Vaping Use    Vaping Use: Never used   Substance and Sexual Activity    Alcohol use: No     Alcohol/week: 0.0 standard drinks    Drug use: No    Sexual activity: Yes     Review of Systems                    Objective:    Physical Exam  Vitals and nursing note reviewed.   Constitutional:       Appearance: Normal appearance.   HENT:      Head: Normocephalic.   Cardiovascular:      Rate and Rhythm: Normal rate.   Pulmonary:      Effort: Pulmonary effort is normal.   Neurological:      Mental Status: She is alert.   Psychiatric:         Mood and Affect: Mood normal.     Blood pressure 140/80, pulse 84, temperature 97.5 F (36.4 C), temperature source Temporal, resp. rate 16, height 1.702 m (5\' 7" ), weight 112.6 kg (248 lb 4.8 oz), SpO2 94 %.        Wt Readings from Last 4 Encounters:   10/25/21 112.6 kg (248 lb 4.8 oz)   09/06/21 122.5 kg (270 lb)   05/28/21 122.7 kg (270 lb 8.1 oz)   05/17/21 123.8 kg (273 lb)   Body mass index is 38.89 kg/m.      Medical Decision Making:   1. Type 2 diabetes mellitus with diabetic nephropathy, without long-term current use of insulin  Doing well A1c well under 8  Wt Readings from Last 4 Encounters:   10/25/21 112.6 kg (248 lb 4.8 oz)   09/06/21 122.5  kg (270 lb)   05/28/21 122.7 kg (270 lb 8.1 oz)   05/17/21 123.8 kg (273 lb)       2. Essential (primary) hypertension  BP Readings from Last 3 Encounters:   10/25/21 140/80   05/28/21 129/65   05/17/21 124/68   Improving    3. Clear cell carcinoma of left kidney    4. ESRD on dialysis  - Methoxy PEG-Epoetin Beta (MIRCERA IJ); 30 mcg    5. Mixed hyperlipidemia  - atorvastatin (LIPITOR) 20 MG tablet; Take 20 mg by mouth every evening      Return in about 4 weeks (around 11/22/2021).      Follow up sooner if no improvement or if symptoms worsen or persist.   Medication dosing instructions, drug-drug interactions and potential side effects discussed in detail.   Failure to comply with plan of care discussed  with patient. Importance of adhering to follow up office visits discussed in detail.   Potential risks of noncompliance discussed with patient.    Ocie Doyne, NP

## 2021-11-02 ENCOUNTER — Encounter: Payer: Self-pay | Admitting: Obstetrics and Gynecology

## 2021-11-07 ENCOUNTER — Telehealth (RURAL_HEALTH_CENTER): Payer: Self-pay | Admitting: Gerontology

## 2021-11-07 MED ORDER — CYCLOBENZAPRINE HCL 5 MG PO TABS
5.0000 mg | ORAL_TABLET | Freq: Three times a day (TID) | ORAL | 1 refills | Status: DC | PRN
Start: 2021-11-07 — End: 2022-05-25

## 2021-11-07 NOTE — Telephone Encounter (Signed)
Sent to [employer]

## 2021-11-07 NOTE — Telephone Encounter (Signed)
Kelli Carlson asking if she can try a muscle relaxer the tramadol does not seem to be helping.  Her neck pain seems to be going down into shoulder.

## 2021-11-22 ENCOUNTER — Encounter (RURAL_HEALTH_CENTER): Payer: Self-pay | Admitting: Gerontology

## 2021-11-22 ENCOUNTER — Ambulatory Visit: Payer: 59 | Attending: Gerontology | Admitting: Gerontology

## 2021-11-22 VITALS — BP 134/86 | HR 84 | Temp 97.6°F | Resp 16 | Ht 67.0 in | Wt 243.6 lb

## 2021-11-22 DIAGNOSIS — I120 Hypertensive chronic kidney disease with stage 5 chronic kidney disease or end stage renal disease: Secondary | ICD-10-CM

## 2021-11-22 DIAGNOSIS — E1122 Type 2 diabetes mellitus with diabetic chronic kidney disease: Secondary | ICD-10-CM

## 2021-11-22 DIAGNOSIS — Z992 Dependence on renal dialysis: Secondary | ICD-10-CM

## 2021-11-22 DIAGNOSIS — N186 End stage renal disease: Secondary | ICD-10-CM

## 2021-11-22 LAB — POCT HEMOGLOBIN A1C: POCT Hgb A1C: 6 % — AB (ref 3.9–5.9)

## 2021-11-22 NOTE — Progress Notes (Signed)
Subjective:    Patient ID: Kelli Carlson is a 56 y.o. female.  Chief Complaint   Patient presents with    Diabetes     1 month DM check up       HPI: DM- type 2 with ESRD on dialysis, she will be going to [facility] to meet with transplant team.  A1c today is 6    The following portions of the patient's history were reviewed and updated as appropriate: allergies, current medications, past family history, past medical history, past social history, past surgical history and problem list.    Social History     Socioeconomic History    Marital status: Married   Tobacco Use    Smoking status: Never    Smokeless tobacco: Never   Vaping Use    Vaping Use: Never used   Substance and Sexual Activity    Alcohol use: No     Alcohol/week: 0.0 standard drinks    Drug use: No    Sexual activity: Yes     Review of Systems                    Objective:    Physical Exam  Vitals and nursing note reviewed.   Constitutional:       Appearance: Normal appearance.   Cardiovascular:      Rate and Rhythm: Normal rate.   Pulmonary:      Effort: Pulmonary effort is normal.   Neurological:      Mental Status: She is alert.     Blood pressure 134/86, pulse 84, temperature 97.6 F (36.4 C), temperature source Temporal, resp. rate 16, height 1.702 m (5\' 7" ), weight 110.5 kg (243 lb 9.6 oz), SpO2 97 %.        BP Readings from Last 3 Encounters:   11/22/21 134/86   10/25/21 140/80   05/28/21 129/65     Wt Readings from Last 4 Encounters:   11/22/21 110.5 kg (243 lb 9.6 oz)   10/25/21 112.6 kg (248 lb 4.8 oz)   09/06/21 122.5 kg (270 lb)   05/28/21 122.7 kg (270 lb 8.1 oz)   Body mass index is 38.15 kg/m.        Assessment:       No diagnosis found.      Plan:       1. Type 2 DM with hypertension and ESRD on dialysis    - POCT Hemoglobin A1C    6 today  Wt Readings from Last 4 Encounters:   11/22/21 110.5 kg (243 lb 9.6 oz)   10/25/21 112.6 kg (248 lb 4.8 oz)   09/06/21 122.5 kg (270 lb)   05/28/21 122.7 kg (270 lb 8.1 oz)   Goal is for  kidney transplant  She continues to follow closely with Dr Judeth Horn who is her nephrologist, we did review his notes in Care Everywhere    No follow-ups on file.        Follow up sooner if no improvement or if symptoms worsen or persist.   Medication dosing instructions, drug-drug interactions and potential side effects discussed in detail.   Failure to comply with plan of care discussed with patient. Importance of adhering to follow up office visits discussed in detail.   Potential risks of noncompliance discussed with patient.    Ocie Doyne, NP

## 2021-11-30 ENCOUNTER — Emergency Department
Admission: EM | Admit: 2021-11-30 | Discharge: 2021-12-01 | Disposition: A | Discharge status: Home / Self Care | Payer: 59 | Attending: Emergency Medicine | Admitting: Emergency Medicine

## 2021-11-30 ENCOUNTER — Encounter (HOSPITAL_COMMUNITY): Payer: Self-pay

## 2021-11-30 ENCOUNTER — Other Ambulatory Visit: Payer: Self-pay

## 2021-11-30 DIAGNOSIS — Z992 Dependence on renal dialysis: Secondary | ICD-10-CM | POA: Insufficient documentation

## 2021-11-30 DIAGNOSIS — Z20822 Contact with and (suspected) exposure to covid-19: Secondary | ICD-10-CM | POA: Insufficient documentation

## 2021-11-30 DIAGNOSIS — R197 Diarrhea, unspecified: Secondary | ICD-10-CM | POA: Insufficient documentation

## 2021-11-30 DIAGNOSIS — E1122 Type 2 diabetes mellitus with diabetic chronic kidney disease: Secondary | ICD-10-CM | POA: Insufficient documentation

## 2021-11-30 DIAGNOSIS — N186 End stage renal disease: Secondary | ICD-10-CM | POA: Insufficient documentation

## 2021-11-30 DIAGNOSIS — I120 Hypertensive chronic kidney disease with stage 5 chronic kidney disease or end stage renal disease: Secondary | ICD-10-CM | POA: Insufficient documentation

## 2021-11-30 DIAGNOSIS — E860 Dehydration: Secondary | ICD-10-CM | POA: Insufficient documentation

## 2021-11-30 LAB — COMPREHENSIVE METABOLIC PANEL, NON-FASTING
ALBUMIN: 3.9 g/dL (ref 3.5–5.0)
ALKALINE PHOSPHATASE: 73 U/L (ref 50–130)
ALT (SGPT): 36 U/L — ABNORMAL HIGH (ref 8–22)
ANION GAP: 12 mmol/L (ref 4–13)
AST (SGOT): 26 U/L (ref 8–45)
BILIRUBIN TOTAL: 0.6 mg/dL (ref 0.3–1.3)
BUN/CREA RATIO: 4 — ABNORMAL LOW (ref 6–22)
BUN: 17 mg/dL (ref 8–25)
CALCIUM: 10.3 mg/dL — ABNORMAL HIGH (ref 8.5–10.0)
CHLORIDE: 94 mmol/L — ABNORMAL LOW (ref 96–111)
CO2 TOTAL: 29 mmol/L (ref 22–30)
CREATININE: 3.94 mg/dL — ABNORMAL HIGH (ref 0.60–1.05)
ESTIMATED GFR: 13 mL/min/BSA — ABNORMAL LOW (ref >=60)
GLUCOSE: 172 mg/dL — ABNORMAL HIGH (ref 65–125)
POTASSIUM: 4.1 mmol/L (ref 3.5–5.1)
PROTEIN TOTAL: 8 g/dL (ref 6.4–8.3)
SODIUM: 135 mmol/L — ABNORMAL LOW (ref 136–145)

## 2021-11-30 LAB — RESPIRATORY VIRUS PANEL

## 2021-11-30 LAB — CBC WITH DIFF
BASOPHIL #: <0.1 10*3/uL (ref <=0.20)
BASOPHIL %: 0 %
EOSINOPHIL #: <0.1 10*3/uL (ref <=0.50)
EOSINOPHIL %: 1 %
HCT: 34.8 % (ref 34.8–46.0)
HGB: 11.7 g/dL (ref 11.5–16.0)
IMMATURE GRANULOCYTE #: <0.1 10*3/uL (ref <0.10)
IMMATURE GRANULOCYTE %: 0 % (ref 0–1)
LYMPHOCYTE #: 1.35 10*3/uL (ref 1.00–4.80)
LYMPHOCYTE %: 12 %
MCH: 34.8 pg — ABNORMAL HIGH (ref 26.0–32.0)
MCHC: 33.6 g/dL (ref 31.0–35.5)
MCV: 103.6 fL — ABNORMAL HIGH (ref 78.0–100.0)
MONOCYTE #: 0.4 10*3/uL (ref 0.20–1.10)
MONOCYTE %: 4 %
MPV: 10 fL (ref 8.7–12.5)
NEUTROPHIL #: 9.23 10*3/uL — ABNORMAL HIGH (ref 1.50–7.70)
NEUTROPHIL %: 83 %
PLATELETS: 273 10*3/uL (ref 150–400)
RBC: 3.36 10*6/uL — ABNORMAL LOW (ref 3.85–5.22)
RDW-CV: 16.9 % — ABNORMAL HIGH (ref 11.5–15.5)
WBC: 11.1 10*3/uL — ABNORMAL HIGH (ref 3.7–11.0)

## 2021-11-30 LAB — LIPASE: LIPASE: 51 U/L (ref 10–60)

## 2021-11-30 LAB — MAGNESIUM: MAGNESIUM: 2.2 mg/dL (ref 1.8–2.6)

## 2021-11-30 MED ORDER — SODIUM CHLORIDE 0.9 % IV BOLUS
1000.0000 mL | INJECTION | Status: AC
Start: 2021-11-30 — End: 2021-11-30
  Administered 2021-11-30: 0 mL via INTRAVENOUS
  Administered 2021-11-30: 1000 mL via INTRAVENOUS

## 2021-11-30 NOTE — ED Triage Notes (Signed)
Reports having a low blood pressure and diarrhea. She reports she is a dialysis pt and routinely HTN.

## 2021-11-30 NOTE — ED Provider Notes (Signed)
[name], [facility] - Emergency Department  Emergency Department Visit Note      Chief Complaint:  Diarrhea    HISTORY OF PRESENT ILLNESS     [name], date of birth 1966-09-20, is a 56 y.o.female who presents to the Emergency Department on 12/01/2021 with diarrhea. The patient states that for the past several days she has had symptoms of abdominal cramping, diarrhea, and weakness. She states that she has had several episodes of diarrhea at home, and she denies any blood or mucous in her stool prior to arrival to the ED. She states that she is dehydrated due to her lack of fluid intake, and she notes that her blood pressure has been low in the past several days due to her symptoms. She notes a history of kidney disease, and she is on hemodialysis weekly. She denies symptoms of nausea, vomiting, syncope, fever, headaches, chest pain, shortness of breath, and any urinary issues.     REVIEW OF SYSTEMS     The pertinent positive and negative symptoms are as per HPI. All other systems reviewed and are negative.     PATIENT HISTORY     Past Medical History:  Past Medical History:   Diagnosis Date   . Anxiety    . Arthritis     hips   . Beta blocker prescribed for left ventricular systolic dysfunction     Labetalol    . 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 Cataract     S/P cataract extraction bilaterally    . Cellulitis 10/24/14    left foot   . Cellulitis 10/21/2020    LLE extremity 09/2020-resolved per patient    . Chronic pain     sec. to arthritis-hips   . 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 Constipation    . COVID 07/2019   . Depression    . Diabetes mellitus (CMS [place])    . Diabetes mellitus, type 2 (CMS HCC)    . Diarrhea    . Dyspnea on exertion    . ESRD (end stage renal disease) (CMS [place])    . Essential hypertension    . Exercise intolerance    . Fatty liver    . Fluid  overload 08/21/21    Recent hospitalization for fluid overload, HD initiated 10/18/20   . Headache(784.0)    . Hemodialysis patient (CMS [place])     HD Monday-Wednesday-Friday   . 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 History of nephrectomy 03/24/2020    L nephrectomy   . Hx of echocardiogram 10/18/2020    Technically difficult d/t pt. body habitus; Left vent. EF est. 55-60%; mildy calcified aortic vlave leaflets-especially right coroanry cusp; Small mobile density on aortic valve-no aortic valve stenosis or regurg; Concentric remodeling; mild MR; Trace AR; Trileaflet aortic vlave; Nl IVC size wiht <50% insp. collapse;      Marland Kitchen Hyperlipidemia    . Hyperparathyroidism (CMS [place])    . Hyperthyroidism     Old records states hyperthyroidism, patient denies, states has hyperparathyroidism   . Irritable bowel syndrome    . Macular edema    . Morbid obesity (CMS [place])    . Obesity    . Panic attack    . Peripheral edema     ankles/feet   .  Peripheral neuropathy     bilateral feet   . Pneumonia 07/2019; 09/2020    COVID Pneumonia 07/2019; fluid overload/pneumonia with recent hosp. 09/2020   . Shortness of breath     SOB after climbing 8 steps    . Type 2 diabetes mellitus (CMS HCC)    . Wears glasses     reading   . White coat hypertension      Past Surgical History:  Past Surgical History:   Procedure Laterality Date   . Av fistula placement Left 08/11/2020   . Ectopic pregnancy surgery  05/24/1998   . Hx cataract removal Bilateral 2016   . Hx cholecystectomy  09/10/96   . Hx hand surgery Right 2017   . Hx other Right 10/18/2020   . Hx pelvic laparoscopy  05/24/98   . Hx shoulder surgery Left 2017   . Hx tubal ligation  09/10/2012   . Hymenectomy  1996   . Kidney surgery  10/24/17; 03/24/20   . Laparoscopic nephrectomy, hand assisted Left 03/24/2020   . Laparoscopic partial nephrectomy Left 10/24/2017   . Renal biopsy, percutaneous  03/08/2020     Family History:  Family Medical History:     Problem  Relation (Age of Onset)    Breast Cancer Paternal Aunt    Cancer Maternal Grandmother    Diabetes Father, Paternal Aunt    High Cholesterol Maternal Uncle, Mother    Hypertension (High Blood Pressure) Maternal Uncle, Father        Social History:  Social History     Tobacco Use   . Smoking status: Never   . Smokeless tobacco: Never   Vaping Use   . Vaping Use: Never used   Substance Use Topics   . Alcohol use: No   . Drug use: No     Medications:  Current Outpatient Medications   Medication Sig   . albuterol sulfate (PROVENTIL OR VENTOLIN OR PROAIR) 90 mcg/actuation Inhalation HFA Aerosol Inhaler Take 2 Puffs by inhalation Every 4 hours as needed   . ALPRAZolam (XANAX) 0.25 mg Oral Tablet Take 1 Tablet (0.25 mg total) by mouth Once per day as needed for Anxiety   . atorvastatin (LIPITOR) 20 mg Oral Tablet Take 1 Tablet (20 mg total) by mouth Every evening   . cyclobenzaprine (FLEXERIL) 5 mg Oral Tablet Take 1 Tablet (5 mg total) by mouth Three times a day as needed for Muscle spasms   . escitalopram oxalate (LEXAPRO) 10 mg Oral Tablet Take 1 Tablet (10 mg total) by mouth Every night   . Fish Oil-Omega-3 Fatty Acids 360-1,200 mg Oral Capsule Take 2 Capsules by mouth Twice daily   . fluticasone (FLONASE) 50 mcg/actuation Nasal Spray, Suspension Administer 1 Spray into each nostril Once per day as needed (Allergy symptoms (nasal congestion/ runny nose))   . JANUVIA 50 mg Oral Tablet Take 1 Tablet (50 mg total) by mouth Every morning   . labetaloL (NORMODYNE) 200 mg Oral Tablet Take 1 Tablet (200 mg total) by mouth Every 8 hours for 30 days Indications: Hold for SBP<120 or HR<55   . loperamide (IMODIUM) 2 mg Oral Capsule Take 2 Capsules (4 mg total) by mouth Every 6 hours as needed (Diarrhea)   . loratadine (CLARITIN) 10 mg Oral Tablet Take 1 Tablet (10 mg total) by mouth Once per day as needed (Allergies)   . losartan (COZAAR) 25 mg Oral Tablet Take 1 Tablet (25 mg total) by mouth Once a day for 30 days (Patient  taking  differently: Take 1 Tablet (25 mg total) by mouth Take in the evening on non-dialysis days (Sun/Tues/Thurs/Sat); does not take on dialysis days)   . traMADoL (ULTRAM) 50 mg Oral Tablet Take 1 Tablet (50 mg total) by mouth Every 6 hours as needed for Pain   . vitamin E 100 unit Oral Capsule Take 1 Capsule (100 Units total) by mouth Twice daily     Allergies:  Allergies   Allergen Reactions   . Hydrocodone    . Ibuprofen    . Vicodin [Hydrocodone-Acetaminophen] Itching     PHYSICAL EXAM     Vitals:  ED Triage Vitals [11/30/21 2223]   BP (Non-Invasive) 125/89   Heart Rate 80   Respiratory Rate 20   Temperature 36.5 C (97.7 F)   SpO2 100 %   Weight 107 kg (235 lb)   Height 1.702 m ('5\' 7"'$ )     Constitutional: The patient is alert and oriented to person, place, and time. Generalized weakness.   HENT: Atraumatic, normocephalic head. Mucous membranes moist. TM's clear, Nares unremarkable. Oropharynx shows no erythema or exudate.   Eyes: Pupils equal and round, reactive to light. No scleral icterus. Normal conjunctiva. Extraocular movements are intact.  Neck: Supple, non-tender, no nuchal rigidity, no adenopathy.   Lungs: Clear to auscultation bilaterally. Symmetric and equal expansion. No respiratory distress or retractions.  Cardiovascular: Heart is S1-S2 regular rate and regular rhythm without murmur click or rub.  Abdomen:  Soft, non-distended. No tenderness to palpation without evidence of rebound or guarding. No pulsatile masses. No organomegaly.   Genitourinary: No CVA tenderness.  Extremities: Full range of motion, no clubbing, cyanosis, or edema. Pulses 2+, capillary refill <2 seconds.  Spine: No midline or paraspinal muscle tenderness to palpation. No step-off.   Skin: Pale. No cyanosis, jaundice, rash or lesion.  Neurologic: Alert and oriented x3. Normal facial symmetry and speech, Normal upper and lower extremity strength, and grossly normal sensation.      DIAGNOSTIC STUDIES     Labs:    Results for orders  placed or performed during the hospital encounter of 11/30/21   GI PANEL BY BIOFIRE FILM ARRAY ([facility])    Specimen: Stool   Result Value Ref Range    CAMPYLOBACTER Not Detected Not Detected    CLOSTRIDIUM DIFFICILE TOXIN A/B Not Detected Not Detected    PLESIOMONAS SHIGELLOIDES Not Detected Not Detected    SALMONELLA Not Detected Not Detected    VIBRIO Not Detected Not Detected    VIBRIO CHOLERAE Not Detected Not Detected    YERSINIA ENTEROCOLITICA Not Detected Not Detected    ENTEROAGGREGATIVE E. COLI (EAEC) Not Detected Not Detected    ENTEROPATHOGENIC E COLI (EPEC) Not Detected Not Detected, N/A    ENTEROTOXIGENIC E COLI (ETEC) LT/ST Not Detected Not Detected    SHIGA-LIKE TOXIN-PRODUCING E COLI (STEC) STX1/STX2 Not Detected Not Detected    SHIGELLA/ENTEROINVASIVE E COLI (EIEC) Not Detected Not Detected    CRYPTOSPORIDIUM Not Detected Not Detected    CYCLOSPORA CAYETANENSIS Not Detected Not Detected    ENTAMOEBA HISTOLYTICA Not Detected Not Detected    GIARDIA LAMBLIA Not Detected Not Detected    ADENOVIRUS F 40/41 Not Detected Not Detected    ASTROVIRUS Not Detected Not Detected    NOROVIRUS GI/GII Not Detected Not Detected    ROTAVIRUS A Not Detected Not Detected    SAPOVIRUS Not Detected Not Detected   RESPIRATORY VIRUS PANEL    Specimen: Nasopharyngeal Swab   Result Value Ref Range  ADENOVIRUS ARRAY Not Detected Not Detected    CORONAVIRUS 229E Not Detected Not Detected    CORONAVIRUS HKU1 Not Detected Not Detected    CORONAVIRUS NL63 Not Detected Not Detected    CORONAVIRUS OC43 Not Detected Not Detected    SARS CORONAVIRUS 2 (SARS-CoV-2) Not Detected Not Detected    METAPNEUMOVIRUS ARRAY Not Detected Not Detected    RHINOVIRUS/ENTEROVIRUS ARRAY Not Detected Not Detected    INFLUENZA A Not Detected Not Detected    INFLUENZA B ARRAY Not Detected Not Detected    PARAINFLUENZA 1 ARRAY Not Detected Not Detected    PARAINFLUENZA 2 ARRAY Not Detected Not Detected    PARAINFLUENZA 3 ARRAY Not Detected  Not Detected    PARAINFLUENZA 4 ARRAY Not Detected Not Detected    RSV ARRAY Not Detected Not Detected    BORDETELLA PARAPERTUSSIS (IS 1001) Not Detected Not Detected    BORDETELLA PERTUSSIS ARRAY Not Detected Not Detected    CHLAMYDOPHILA PNEUMONIAE ARRAY Not Detected Not Detected    MYCOPLASMA PNEUMONIAE ARRAY Not Detected Not Detected   COMPREHENSIVE METABOLIC PANEL, NON-FASTING   Result Value Ref Range    SODIUM 135 (L) 136 - 145 mmol/L    POTASSIUM 4.1 3.5 - 5.1 mmol/L    CHLORIDE 94 (L) 96 - 111 mmol/L    CO2 TOTAL 29 22 - 30 mmol/L    ANION GAP 12 4 - 13 mmol/L    BUN 17 8 - 25 mg/dL    CREATININE 3.94 (H) 0.60 - 1.05 mg/dL    BUN/CREA RATIO 4 (L) 6 - 22    ESTIMATED GFR 13 (L) >=60 mL/min/BSA    ALBUMIN 3.9 3.5 - 5.0 g/dL     CALCIUM 10.3 (H) 8.5 - 10.0 mg/dL    GLUCOSE 172 (H) 65 - 125 mg/dL    ALKALINE PHOSPHATASE 73 50 - 130 U/L    ALT (SGPT) 36 (H) 8 - 22 U/L    AST (SGOT)  26 8 - 45 U/L    BILIRUBIN TOTAL 0.6 0.3 - 1.3 mg/dL    PROTEIN TOTAL 8.0 6.4 - 8.3 g/dL   LIPASE   Result Value Ref Range    LIPASE 51 10 - 60 U/L   Magnesium   Result Value Ref Range    MAGNESIUM 2.2 1.8 - 2.6 mg/dL   CBC WITH DIFF   Result Value Ref Range    WBC 11.1 (H) 3.7 - 11.0 x10^3/uL    RBC 3.36 (L) 3.85 - 5.22 x10^6/uL    HGB 11.7 11.5 - 16.0 g/dL    HCT 34.8 34.8 - 46.0 %    MCV 103.6 (H) 78.0 - 100.0 fL    MCH 34.8 (H) 26.0 - 32.0 pg    MCHC 33.6 31.0 - 35.5 g/dL    RDW-CV 16.9 (H) 11.5 - 15.5 %    PLATELETS 273 150 - 400 x10^3/uL    MPV 10.0 8.7 - 12.5 fL    NEUTROPHIL % 83 %    LYMPHOCYTE % 12 %    MONOCYTE % 4 %    EOSINOPHIL % 1 %    BASOPHIL % 0 %    NEUTROPHIL # 9.23 (H) 1.50 - 7.70 x10^3/uL    LYMPHOCYTE # 1.35 1.00 - 4.80 x10^3/uL    MONOCYTE # 0.40 0.20 - 1.10 x10^3/uL    EOSINOPHIL # <0.10 <=0.50 x10^3/uL    BASOPHIL # <0.10 <=0.20 x10^3/uL    IMMATURE GRANULOCYTE % 0 0 - 1 %  IMMATURE GRANULOCYTE # <0.10 <0.10 x10^3/uL     Labs reviewed and interpreted by me.    ED PROGRESS NOTE / [facility] records reviewed by me:  I have reviewed the nurse's notes. I have reviewed the patient's problem list and pertinent past medical records.    Orders Placed This Encounter   . GI PANEL BY BIOFIRE FILM ARRAY ([facility])   . RESPIRATORY VIRUS PANEL   . COMPREHENSIVE METABOLIC PANEL, NON-FASTING   . LIPASE   . Magnesium   . CBC WITH DIFF   . INSERT & MAINTAIN PERIPHERAL IV ACCESS   . NS bolus infusion 1,000 mL            Summary:    The patient is a 56 year old female who presents to the ED with diarrhea. The patient states that for the past several days she has had symptoms of abdominal cramping, diarrhea, and weakness. She states that she has had several episodes of diarrhea at home, and she denies any blood or mucous in her stool prior to arrival to the ED. She states that she is dehydrated due to her lack of fluid intake, and she notes that her blood pressure has been low in the past several days due to her symptoms. She notes a history of kidney disease, and she is on hemodialysis weekly. She denies symptoms of nausea, vomiting, syncope, fever, headaches, chest pain, shortness of breath, and any urinary issues. Past history of type II diabetes, kidney disease, and clear cell carcinoma. Medications include Flonase, Imodium, Lexapro, and Xanax. Allergies to Hydrocodone and Ibuprofen.     Interpretation:    Medical Decision Making  Dehydration: acute illness or injury  Diarrhea, unspecified type: acute illness or injury  Amount and/or Complexity of Data Reviewed  External Data Reviewed: labs and notes.  Labs: ordered. Decision-making details documented in ED Course.     Details: Respiratory virus panel negative  Sodium low at 135  Chloride low at 94  Creatinine elevated at 3.94  BUN/Creatinine ratio low at 4  GFR low at 13  Calcium elevated at 10.3  Glucose elevated at 172  ALT elevated at 36  Lipase normal at 51  Magnesium normal at 2.2  WBC elevated at 11.1  RBC low at 3.36  MCV elevated at 103.6  GI  panel is negative        Differential Diagnosis:    1. Colitis   2. Viral syndrome  3. Dehydration  4. Electrolyte abnormality   5. Kidney disease on dialysis    Work-Up:    22:25: Initial evaluation is complete at this time. I discussed with the patient that I would order a GI panel, CBC, Lipase, Magnesium, CMP, and a respiratory virus panel to further evaluate. NS bolus fluids ordered for patient symptoms. Patient is agreeable with the treatment plan at this time.     Final Decision Making:     00:45: On recheck, the patient is in no acute distress. I explained the results of the diagnostic studies. Patient will be discharged. Patient is to follow up with Ocie Doyne, NP and call in 2 days. I discussed the diagnosis, disposition, and follow-up plan. The patient understood and in accordance with the treatment plan at this time. Patient is to return here to the Emergency Department if new or worsening symptoms appear. All of their questions have been answered to their satisfaction. The patient is in stable condition at the time  of discharge.     OPIATE PRESCRIPTION       Not applicable    CORE MEASURES      Not applicable    CRITICAL CARE TIME      Not applicable     PRE-DISPOSITION VITALS      Pre-Disposition Vitals:  Filed Vitals:    11/30/21 2223   BP: 125/89   Pulse: 80   Resp: 20   Temp: 36.5 C (97.7 F)   SpO2: 100%     CLINICAL IMPRESSION     Clinical Impression   Diarrhea, unspecified type (Primary)   Dehydration     DISPOSITION/PLAN     Discharged        Follow-Up:     Ocie Doyne, [facility]  [place] [address]  [phone]    Call in 2 days  for follow up      Condition at Disposition: Stable    SCRIBE Thompsons, SCRIBE scribed for Malachy Chamber, MD.     Documentation assistance provided for Malachy Chamber, MD by Royanne Foots II, SCRIBE, SCRIBE. Information recorded by the scribe was done at my direction and has been reviewed  and validated by me Malachy Chamber, MD.

## 2021-12-01 ENCOUNTER — Encounter (HOSPITAL_COMMUNITY): Payer: Self-pay | Admitting: Emergency Medicine

## 2021-12-01 LAB — GI PANEL BY BIOFIRE FILM ARRAY (BMC/BRX/JMC/JAX)
ADENOVIRUS F 40/41: NOT DETECTED
ASTROVIRUS: NOT DETECTED
CAMPYLOBACTER: NOT DETECTED
CLOSTRIDIUM DIFFICILE TOXIN A/B: NOT DETECTED
CRYPTOSPORIDIUM: NOT DETECTED
CYCLOSPORA CAYETANENSIS: NOT DETECTED
ENTAMOEBA HISTOLYTICA: NOT DETECTED
ENTEROAGGREGATIVE E. COLI (EAEC): NOT DETECTED
ENTEROPATHOGENIC E COLI (EPEC): NOT DETECTED
ENTEROTOXIGENIC E COLI (ETEC) LT/ST: NOT DETECTED
GIARDIA LAMBLIA: NOT DETECTED
NOROVIRUS GI/GII: NOT DETECTED
PLESIOMONAS SHIGELLOIDES: NOT DETECTED
ROTAVIRUS A: NOT DETECTED
SALMONELLA: NOT DETECTED
SAPOVIRUS: NOT DETECTED
SHIGA-LIKE TOXIN-PRODUCING E COLI (STEC) STX1/STX2: NOT DETECTED
SHIGELLA/ENTEROINVASIVE E COLI (EIEC): NOT DETECTED
VIBRIO CHOLERAE: NOT DETECTED
VIBRIO: NOT DETECTED
YERSINIA ENTEROCOLITICA: NOT DETECTED

## 2021-12-01 NOTE — ED Nurses Note (Signed)
Patient given discharge instructions. Patient verbalized understanding. VSS. NAD. Ambulatory upon discharge.

## 2021-12-06 ENCOUNTER — Other Ambulatory Visit: Payer: Self-pay

## 2021-12-06 ENCOUNTER — Ambulatory Visit
Admission: RE | Admit: 2021-12-06 | Discharge: 2021-12-06 | Disposition: A | Discharge status: Home / Self Care | Payer: BC Managed Care – PPO | Source: Ambulatory Visit | Attending: Advanced Practice Midwife | Admitting: Advanced Practice Midwife

## 2021-12-06 DIAGNOSIS — Z1239 Encounter for other screening for malignant neoplasm of breast: Secondary | ICD-10-CM

## 2021-12-06 DIAGNOSIS — Z1231 Encounter for screening mammogram for malignant neoplasm of breast: Secondary | ICD-10-CM | POA: Insufficient documentation

## 2021-12-15 ENCOUNTER — Ambulatory Visit (RURAL_HEALTH_CENTER): Payer: Self-pay | Admitting: Gerontology

## 2021-12-15 ENCOUNTER — Encounter (RURAL_HEALTH_CENTER): Payer: Self-pay | Admitting: Gerontology

## 2021-12-15 ENCOUNTER — Ambulatory Visit: Payer: 59 | Attending: Gerontology | Admitting: Gerontology

## 2021-12-15 DIAGNOSIS — I10 Essential (primary) hypertension: Secondary | ICD-10-CM

## 2021-12-15 DIAGNOSIS — Z01818 Encounter for other preprocedural examination: Secondary | ICD-10-CM

## 2021-12-15 DIAGNOSIS — Z9889 Other specified postprocedural states: Secondary | ICD-10-CM

## 2021-12-15 MED ORDER — ESCITALOPRAM OXALATE 10 MG PO TABS
10.0000 mg | ORAL_TABLET | Freq: Every day | ORAL | 3 refills | Status: AC
Start: 2021-12-15 — End: ?

## 2021-12-15 MED ORDER — LOSARTAN POTASSIUM 50 MG PO TABS
50.0000 mg | ORAL_TABLET | Freq: Every day | ORAL | 3 refills | Status: AC
Start: 2021-12-15 — End: ?

## 2021-12-15 NOTE — Progress Notes (Signed)
Subjective:     Patient ID: Kelli Carlson is a 56 y.o. female.  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      Current Outpatient Medications:     ALPRAZolam (Xanax) 0.25 MG tablet, Take 1 tablet (0.25 mg) by mouth 2 (two) times daily as needed for Anxiety, Disp: 60 tablet, Rfl: 0    atorvastatin (LIPITOR) 20 MG tablet, Take 20 mg by mouth every evening, Disp: , Rfl:     cyclobenzaprine (FLEXERIL) 5 MG tablet, Take 1 tablet (5 mg) by mouth 3 (three) times daily as needed for Muscle spasms, Disp: 30 tablet, Rfl: 1    fluticasone (FLONASE) 50 MCG/ACT nasal spray, 1 spray by Nasal route, Disp: , Rfl:     iron sucrose (VENOFER) 20 MG/ML injection, 50 mg, Disp: , Rfl:     Januvia 50 MG tablet, , Disp: , Rfl:     LABETALOL HCL PO, Take 100 mg by mouth 2 (two) times daily 100mg  in am and 200 mg in pm, Disp: , Rfl:     loperamide (IMODIUM) 2 MG capsule, Take 1 capsule by mouth, Disp: , Rfl:     loratadine (CLARITIN) 10 MG tablet, Take 10 mg by mouth, Disp: , Rfl:     Methoxy PEG-Epoetin Beta (MIRCERA IJ), 30 mcg, Disp: , Rfl:     Omega-3 Fatty Acids (fish oil) 1000 MG Cap capsule, Take 1 capsule by mouth 2 (two) times daily, Disp: , Rfl:     sevelamer (RENVELA) 800 MG tablet, Take 1,600 mg by mouth 3 (three) times daily with meals, Disp: , Rfl:     traMADol (ULTRAM) 50 MG tablet, Take 1 tablet (50 mg) by mouth every 8 (eight) hours as needed for Pain, Disp: 30 tablet, Rfl: 2    vitamin E 100 UNIT  capsule, Take 100 Units by mouth, Disp: , Rfl:     escitalopram (LEXAPRO) 10 MG tablet, Take 1 tablet (10 mg) by mouth daily, Disp: 90 tablet, Rfl: 3    losartan (COZAAR) 50 MG tablet, Take 1 tablet (50 mg) by mouth daily, Disp: 90 tablet, Rfl: 3    prednisoLONE acetate (PRED FORTE) 1 % ophthalmic suspension, , Disp: , Rfl:      Social History     Socioeconomic History    Marital status: Married   Tobacco Use    Smoking status: Never    Smokeless tobacco: Never   Vaping Use    Vaping status: Never Used   Substance and Sexual Activity    Alcohol use: No     Alcohol/week: 0.0 standard drinks of alcohol    Drug use: No    Sexual activity: Yes     Review of Systems   Eyes:  Positive for visual disturbance.                       Objective:    Physical Exam  Vitals  and nursing note reviewed.   Constitutional:       Appearance: Normal appearance.   HENT:      Head: Normocephalic.   Cardiovascular:      Rate and Rhythm: Normal rate.   Pulmonary:      Effort: Pulmonary effort is normal.   Musculoskeletal:         General: Normal range of motion.   Neurological:      Mental Status: She is alert. Mental status is at baseline.       Blood pressure 126/76, pulse 80, temperature 98 F (36.7 C), temperature source Temporal, resp. rate 16, height 1.702 m (5\' 7" ), weight 110.3 kg (243 lb 1.6 oz), SpO2 90 %.        BP Readings from Last 3 Encounters:   12/15/21 126/76   11/22/21 134/86   10/25/21 140/80     Wt Readings from Last 4 Encounters:   12/15/21 110.3 kg (243 lb 1.6 oz)   11/22/21 110.5 kg (243 lb 9.6 oz)   10/25/21 112.6 kg (248 lb 4.8 oz)   09/06/21 122.5 kg (270 lb)     Allergies   Allergen Reactions    Gabapentin     Hydrocodone     Ibuprofen     Hydrocodone-Acetaminophen Itching and Other (See Comments)      Medical Decision Making:   1. Pre-op exam  She is medically stable to undergo proposed procedure  2. H/O vitrectomy    3. Essential hypertension  - losartan (COZAAR) 50 MG tablet; Take 1 tablet (50 mg) by mouth  daily  Dispense: 90 tablet; Refill: 3      No follow-ups on file.      Follow up sooner if no improvement or if symptoms worsen or persist.   Medication dosing instructions, drug-drug interactions and potential side effects discussed in detail.   Failure to comply with plan of care discussed with patient. Importance of adhering to follow up office visits discussed in detail.   Potential risks of noncompliance discussed with patient.    Ocie Doyne, NP

## 2021-12-16 ENCOUNTER — Encounter (RURAL_HEALTH_CENTER): Payer: Self-pay

## 2021-12-16 NOTE — Progress Notes (Signed)
Kelli Carlson with [facility] called requesting pre-op be faxed today for her surgery that is on Tuesday.    Printed/faxed preop from 12/15/21 as requested to [facility] @ [phone]. AMS

## 2021-12-20 ENCOUNTER — Ambulatory Visit (RURAL_HEALTH_CENTER): Payer: 59 | Admitting: Gerontology

## 2022-02-22 ENCOUNTER — Other Ambulatory Visit: Payer: Self-pay

## 2022-02-22 ENCOUNTER — Emergency Department
Admission: EM | Admit: 2022-02-22 | Discharge: 2022-02-22 | Disposition: A | Discharge status: Home / Self Care | Payer: 59 | Attending: Emergency Medicine | Admitting: Emergency Medicine

## 2022-02-22 ENCOUNTER — Ambulatory Visit (HOSPITAL_COMMUNITY)
Admission: RE | Admit: 2022-02-22 | Discharge: 2022-02-22 | Disposition: A | Discharge status: Home / Self Care | Payer: 59 | Source: Ambulatory Visit

## 2022-02-22 DIAGNOSIS — N185 Chronic kidney disease, stage 5: Secondary | ICD-10-CM

## 2022-02-22 DIAGNOSIS — Z992 Dependence on renal dialysis: Secondary | ICD-10-CM | POA: Insufficient documentation

## 2022-02-22 DIAGNOSIS — I120 Hypertensive chronic kidney disease with stage 5 chronic kidney disease or end stage renal disease: Secondary | ICD-10-CM | POA: Insufficient documentation

## 2022-02-22 DIAGNOSIS — I10 Essential (primary) hypertension: Secondary | ICD-10-CM

## 2022-02-22 DIAGNOSIS — E1136 Type 2 diabetes mellitus with diabetic cataract: Secondary | ICD-10-CM | POA: Insufficient documentation

## 2022-02-22 DIAGNOSIS — R1110 Vomiting, unspecified: Secondary | ICD-10-CM | POA: Insufficient documentation

## 2022-02-22 DIAGNOSIS — R5383 Other fatigue: Secondary | ICD-10-CM | POA: Insufficient documentation

## 2022-02-22 DIAGNOSIS — E1122 Type 2 diabetes mellitus with diabetic chronic kidney disease: Secondary | ICD-10-CM | POA: Insufficient documentation

## 2022-02-22 LAB — CBC WITH DIFF
BASOPHIL #: <0.1 10*3/uL (ref <=0.20)
BASOPHIL %: 0 %
EOSINOPHIL #: <0.1 10*3/uL (ref <=0.50)
EOSINOPHIL %: 1 %
HCT: 29.9 % — ABNORMAL LOW (ref 34.8–46.0)
HGB: 10 g/dL — ABNORMAL LOW (ref 11.5–16.0)
IMMATURE GRANULOCYTE #: <0.1 10*3/uL (ref <0.10)
IMMATURE GRANULOCYTE %: 0 % (ref 0–1)
LYMPHOCYTE #: 1.74 10*3/uL (ref 1.00–4.80)
LYMPHOCYTE %: 25 %
MCH: 35.7 pg — ABNORMAL HIGH (ref 26.0–32.0)
MCHC: 33.4 g/dL (ref 31.0–35.5)
MCV: 106.8 fL — ABNORMAL HIGH (ref 78.0–100.0)
MONOCYTE #: 0.49 10*3/uL (ref 0.20–1.10)
MONOCYTE %: 7 %
MPV: 10.1 fL (ref 8.7–12.5)
NEUTROPHIL #: 4.76 10*3/uL (ref 1.50–7.70)
NEUTROPHIL %: 67 %
PLATELETS: 258 10*3/uL (ref 150–400)
RBC: 2.8 10*6/uL — ABNORMAL LOW (ref 3.85–5.22)
RDW-CV: 14.4 % (ref 11.5–15.5)
WBC: 7.1 10*3/uL (ref 3.7–11.0)

## 2022-02-22 LAB — BLUE TOP TUBE

## 2022-02-22 LAB — COMPREHENSIVE METABOLIC PANEL, NON-FASTING
ALBUMIN: 3.2 g/dL — ABNORMAL LOW (ref 3.5–5.0)
ALKALINE PHOSPHATASE: 70 U/L (ref 50–130)
ALT (SGPT): 20 U/L (ref 8–22)
ANION GAP: 12 mmol/L (ref 4–13)
AST (SGOT): 15 U/L (ref 8–45)
BILIRUBIN TOTAL: 0.5 mg/dL (ref 0.3–1.3)
BUN/CREA RATIO: 7 (ref 6–22)
BUN: 46 mg/dL — ABNORMAL HIGH (ref 8–25)
CALCIUM: 9.4 mg/dL (ref 8.5–10.0)
CHLORIDE: 101 mmol/L (ref 96–111)
CO2 TOTAL: 27 mmol/L (ref 22–30)
CREATININE: 7.06 mg/dL — ABNORMAL HIGH (ref 0.60–1.05)
ESTIMATED GFR: 6 mL/min/BSA — ABNORMAL LOW (ref >=60)
GLUCOSE: 141 mg/dL — ABNORMAL HIGH (ref 65–125)
POTASSIUM: 4.6 mmol/L (ref 3.5–5.1)
PROTEIN TOTAL: 7.1 g/dL (ref 6.4–8.3)
SODIUM: 140 mmol/L (ref 136–145)

## 2022-02-22 LAB — RED TOP TUBE

## 2022-02-22 LAB — GOLD TOP TUBE

## 2022-02-22 LAB — TROPONIN-I
TROPONIN I: 38 ng/L (ref <=30)
TROPONIN I: 44 ng/L (ref <=30)

## 2022-02-22 LAB — PT/INR
INR: 1.04
PROTHROMBIN TIME: 12 seconds (ref 9.4–12.5)

## 2022-02-22 LAB — LIGHT GREEN TOP TUBE

## 2022-02-22 MED ORDER — SODIUM CHLORIDE 0.9 % (FLUSH) INJECTION SYRINGE
10.0000 mL | INJECTION | Freq: Three times a day (TID) | INTRAMUSCULAR | Status: DC
Start: 2022-02-22 — End: 2022-02-22

## 2022-02-22 NOTE — ED Nurses Note (Signed)
DC by Dr. best

## 2022-02-22 NOTE — ED Provider Notes (Signed)
[facility]  [facility] Emergency Department Visit Note    Date of Service: 02/22/2022  Primary Care Doctor: Ocie Doyne, NP  Patient information was obtained from patient.  History/Exam limitations: none.  Patient presented to the Emergency Department by private car    Chief Complaint: Hypertension    HPI:  The patient is a/an 56 y.o. female who presents to the Emergency Department with hypertension. The patient reports being started on blood pressure medicine two weeks ago but was stopped recently due to excessive vomiting, weakness and hypotension prior to receiving dialysis. The patient reports going to dialysis today where her blood pressure was 207/95. The patient reports her last dialysis treatment was Monday due to not being able to receive it today due to her blood pressure and adds that she is generally feeling weak describing her limbs feeling like "jell-o". The patient has a medical history of type two diabetes. The patient denies vomiting, fever, chills, headache and loss of consciousness.    Review of Systems:  The pertinent positive and negative symptoms are as per the HPI. They are otherwise negative as reviewed.    Past Medical History:  Past Medical History:   Diagnosis Date   . Anxiety    . Arthritis     hips   . Beta blocker prescribed for left ventricular systolic dysfunction     Labetalol    . 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 Cataract     S/P cataract extraction bilaterally    . Cellulitis 10/24/14    left foot   . Cellulitis 10/21/2020    LLE extremity 09/2020-resolved per patient    . Chronic pain     sec. to arthritis-hips   . 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 Constipation    . COVID 07/2019   . Depression    . Diabetes mellitus (CMS [place])    . Diabetes mellitus, type 2 (CMS HCC)    . Diarrhea    . Dyspnea on exertion    . ESRD (end stage renal disease)  (CMS [place])    . Essential hypertension    . Exercise intolerance    . Fatty liver    . Fluid overload 08/21/21    Recent hospitalization for fluid overload, HD initiated 10/18/20   . Headache(784.0)    . Hemodialysis patient (CMS [place])     HD Monday-Wednesday-Friday   . 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 History of nephrectomy 03/24/2020    L nephrectomy   . Hx of echocardiogram 10/18/2020    Technically difficult d/t pt. body habitus; Left vent. EF est. 55-60%; mildy calcified aortic vlave leaflets-especially right coroanry cusp; Small mobile density on aortic valve-no aortic valve stenosis or regurg; Concentric remodeling; mild MR; Trace AR; Trileaflet aortic vlave; Nl IVC size wiht <50% insp. collapse;      Marland Kitchen Hyperlipidemia    . Hyperparathyroidism (CMS [place])    . Hyperthyroidism     Old records states hyperthyroidism, patient denies, states has hyperparathyroidism   . Irritable bowel syndrome    . Macular edema    . Morbid obesity (CMS [place])    . Obesity    . Panic attack    . Peripheral edema     ankles/feet   . Peripheral neuropathy  bilateral feet   . Pneumonia 07/2019; 09/2020    COVID Pneumonia 07/2019; fluid overload/pneumonia with recent hosp. 09/2020   . Shortness of breath     SOB after climbing 8 steps    . Type 2 diabetes mellitus (CMS HCC)    . Wears glasses     reading   . White coat hypertension      Past Surgical History:  Past Surgical History:   Procedure Laterality Date   . AV FISTULA PLACEMENT Left 08/11/2020    LUE brachio cephalic fistula creation   . ECTOPIC PREGNANCY SURGERY  05/24/1998   . HX CATARACT REMOVAL Bilateral 2016   . HX CHOLECYSTECTOMY  09/10/96   . HX HAND SURGERY Right 2017    Trigger finger release 3rd finger   . HX OTHER Right 10/18/2020    tunneled dialysis catheter   . HX PELVIC LAPAROSCOPY  05/24/98    Ectopic pregnancy   . HX SHOULDER SURGERY Left 2017    Procedure for frozen shoulder   . HX TUBAL LIGATION  09/10/2012    Filshie Clips   .  HYMENECTOMY  1996   . KIDNEY SURGERY  10/24/17; 03/24/20   . LAPAROSCOPIC NEPHRECTOMY, HAND ASSISTED Left 03/24/2020    Radical   . LAPAROSCOPIC PARTIAL NEPHRECTOMY Left 10/24/2017    hand-assisted   . RENAL BIOPSY, PERCUTANEOUS  03/08/2020    clear cell renal cell carcinoma     Social History:  Social History     Tobacco Use   . Smoking status: Never   . Smokeless tobacco: Never   Vaping Use   . Vaping Use: Never used   Substance Use Topics   . Alcohol use: No   . Drug use: No       Social History     Substance and Sexual Activity   Drug Use No       Current Outpatient Medications:   Previous Medications    ALBUTEROL SULFATE (PROVENTIL OR VENTOLIN OR PROAIR) 90 MCG/ACTUATION INHALATION HFA AEROSOL INHALER    Take 2 Puffs by inhalation Every 4 hours as needed    ALPRAZOLAM (XANAX) 0.25 MG ORAL TABLET    Take 1 Tablet (0.25 mg total) by mouth Once per day as needed for Anxiety    ATORVASTATIN (LIPITOR) 20 MG ORAL TABLET    Take 1 Tablet (20 mg total) by mouth Every evening    CYCLOBENZAPRINE (FLEXERIL) 5 MG ORAL TABLET    Take 1 Tablet (5 mg total) by mouth Three times a day as needed for Muscle spasms    ESCITALOPRAM OXALATE (LEXAPRO) 10 MG ORAL TABLET    Take 1 Tablet (10 mg total) by mouth Every night    FISH OIL-OMEGA-3 FATTY ACIDS 360-1,200 MG ORAL CAPSULE    Take 2 Capsules by mouth Twice daily    FLUTICASONE (FLONASE) 50 MCG/ACTUATION NASAL SPRAY, SUSPENSION    Administer 1 Spray into each nostril Once per day as needed (Allergy symptoms (nasal congestion/ runny nose))    JANUVIA 50 MG ORAL TABLET    Take 1 Tablet (50 mg total) by mouth Every morning    LABETALOL (NORMODYNE) 200 MG ORAL TABLET    Take 1 Tablet (200 mg total) by mouth Every 8 hours for 30 days Indications: Hold for SBP<120 or HR<55    LOPERAMIDE (IMODIUM) 2 MG ORAL CAPSULE    Take 2 Capsules (4 mg total) by mouth Every 6 hours as needed (Diarrhea)    LORATADINE (CLARITIN) 10 MG  ORAL TABLET    Take 1 Tablet (10 mg total) by mouth Once per day as  needed (Allergies)    LOSARTAN (COZAAR) 25 MG ORAL TABLET    Take 1 Tablet (25 mg total) by mouth Once a day for 30 days    TRAMADOL (ULTRAM) 50 MG ORAL TABLET    Take 1 Tablet (50 mg total) by mouth Every 6 hours as needed for Pain    VITAMIN E 100 UNIT ORAL CAPSULE    Take 1 Capsule (100 Units total) by mouth Twice daily       Allergies:   Allergies   Allergen Reactions   . Hydrocodone    . Ibuprofen    . Vicodin [Hydrocodone-Acetaminophen] Itching       Physical Exam     Vital Signs:  Filed Vitals:    02/22/22 1242   BP: (!) 154/77   Pulse: 85   Temp: 36.6 C (97.8 F)   SpO2: 100%       The initial visit vital signs are reviewed as above.     Constitutional: The patient is alert and oriented to person, place and time.  The patient is appropriately interactive with a nontoxic and non-ill appearance.  The patient appears in no distress and is resting comfortably in the gurney.  ENT: Atraumatic, normocephalic head, mucous membranes moist, Trachea is midline without stridor.  Neck: No JVD or thyromegaly or lymphadenopathy, supple.  Lungs: Clear to auscultation bilaterally. With a normal inspiratory:expiratory ratio. No respiratory distress.  Cardiovascular: Heart is S1-S2 regular rate and rhythm without murmur click gallop or rub.  Abdomen: Soft, non-tender, non-distended without evidence of rebound or guarding.  Extremities: No acute tenderness to palpation, deformity, or abnormality of ROM.  Spine: No midline or paraspinal Muscle tenderness to palpation.  No step-off.   Skin: No cyanosis, jaundice, rash or lesion.  Neurologic: normal facial symmetry and speech, 5/5 upper and lower extremity strength, 2/4 patellar DTR's and intact light touch, pressure and pain sensation.   Vascular: Normal peripheral pulses with brisk capillary refills of less than 2 seconds.    ED Course     Old records reviewed and summarized:  1. Emergency department triage notes are reviewed.     Emergency Department Course:    Differential  Diagnosis:  HTN, AKI, SIRS, Encephalopathy    Initial Summary:  Patient is a 56 y.o. female who presents to the ED with the chief complaint of hypertension. I discussed with the patient that I would consult Dr. Judeth Horn to further evaluate. Patient is agreeable with the treatment plan at this time.    1500: I discussed the patient's case and above findings with Dr. Judeth Horn (Nephrology) who reports the patient needs dialysis today once discharged if she does not meet admission criteria, but if she is discharged but not in time to be seen today she should call the clinic and get the first slot available tomorrow.    I updated the patient on the discussion with Dr. Judeth Horn and with the patient's troponin resulting after the dialysis clinic closing, the patient was instructed to go the the clinic tomorrow morning to have dialysis. The patient is agreeable with the treatment plan at this time and will follow-up with both her PCP and Dr. Judeth Horn as instructed.    Initial orders placed:   Orders Placed This Encounter   . XR AP MOBILE CHEST (If patient condition warrants)   . COMPREHENSIVE METABOLIC PANEL, NON-FASTING   . TROPONIN-I   .  PT/INR   . CBC WITH DIFF   . TROPONIN-I   . ECG 12-LEAD (Take to provider with a brief history)   . INSERT & MAINTAIN PERIPHERAL IV ACCESS       Work-up:    Labs:    Results for orders placed or performed during the hospital encounter of 02/22/22   COMPREHENSIVE METABOLIC PANEL, NON-FASTING   Result Value Ref Range    SODIUM 140 136 - 145 mmol/L    POTASSIUM 4.6 3.5 - 5.1 mmol/L    CHLORIDE 101 96 - 111 mmol/L    CO2 TOTAL 27 22 - 30 mmol/L    ANION GAP 12 4 - 13 mmol/L    BUN 46 (H) 8 - 25 mg/dL    CREATININE 7.06 (H) 0.60 - 1.05 mg/dL    BUN/CREA RATIO 7 6 - 22    ESTIMATED GFR 6 (L) >=60 mL/min/BSA    ALBUMIN 3.2 (L) 3.5 - 5.0 g/dL     CALCIUM 9.4 8.5 - 10.0 mg/dL    GLUCOSE 141 (H) 65 - 125 mg/dL    ALKALINE PHOSPHATASE 70 50 - 130 U/L    ALT (SGPT) 20 8 - 22 U/L    AST (SGOT)  15 8 - 45 U/L     BILIRUBIN TOTAL 0.5 0.3 - 1.3 mg/dL    PROTEIN TOTAL 7.1 6.4 - 8.3 g/dL   TROPONIN-I   Result Value Ref Range    TROPONIN I 38 (HH) <=30 ng/L   PT/INR   Result Value Ref Range    PROTHROMBIN TIME 12.0 9.4 - 12.5 seconds    INR 1.04    CBC WITH DIFF   Result Value Ref Range    WBC 7.1 3.7 - 11.0 x10^3/uL    RBC 2.80 (L) 3.85 - 5.22 x10^6/uL    HGB 10.0 (L) 11.5 - 16.0 g/dL    HCT 29.9 (L) 34.8 - 46.0 %    MCV 106.8 (H) 78.0 - 100.0 fL    MCH 35.7 (H) 26.0 - 32.0 pg    MCHC 33.4 31.0 - 35.5 g/dL    RDW-CV 14.4 11.5 - 15.5 %    PLATELETS 258 150 - 400 x10^3/uL    MPV 10.1 8.7 - 12.5 fL    NEUTROPHIL % 67 %    LYMPHOCYTE % 25 %    MONOCYTE % 7 %    EOSINOPHIL % 1 %    BASOPHIL % 0 %    NEUTROPHIL # 4.76 1.50 - 7.70 x10^3/uL    LYMPHOCYTE # 1.74 1.00 - 4.80 x10^3/uL    MONOCYTE # 0.49 0.20 - 1.10 x10^3/uL    EOSINOPHIL # <0.10 <=0.50 x10^3/uL    BASOPHIL # <0.10 <=0.20 x10^3/uL    IMMATURE GRANULOCYTE % 0 0 - 1 %    IMMATURE GRANULOCYTE # <0.10 <0.10 x10^3/uL   BLUE TOP TUBE   Result Value Ref Range    RAINBOW/EXTRA TUBE AUTO RESULT Yes    TROPONIN-I   Result Value Ref Range    TROPONIN I 44 (HH) <=30 ng/L       EKG interpretation:  12-Lead EKG interpreted by me reveals sinus rhythm, rate of 86, cannot rule out anterior infarct, age undetermined.    Radiology:  XR AP MOBILE CHEST (If patient condition warrants)   Final Result   1. No acute cardiopulmonary process is appreciated.              Radiologist location ID: [id number]  Interpreted by radiologist and independently reviewed by me.      Final Decision Making:    Pre-Disposition Vitals:  Filed Vitals:    02/22/22 1242   BP: (!) 154/77   Pulse: 85   Temp: 36.6 C (97.8 F)   SpO2: 100%     Coding Level Tool    Medical Decision Making  Fatigue, unspecified type: acute illness or injury  Hypertension, unspecified type: acute illness or injury  Amount and/or Complexity of Data Reviewed  Labs: ordered. Decision-making details documented in ED  Course.  Radiology: ordered. Decision-making details documented in ED Course.  ECG/medicine tests: ordered and independent interpretation performed. Decision-making details documented in ED Course.          Discharged     Condition on Disposition: Stable      Follow Up:  Ocie Doyne, [address]  [place] [address]  [phone]      As needed    dialysis  call tomorrow per Dr. Areta Haber instructions.      I, Kelby Aline, SCRIBE, scribed for Jonah Blue, DO on 02/22/2022 at 2:40 PM.     Documentation assistance provided for Jonah Blue, DO by Kelby Aline, SCRIBE on 02/22/2022 at 2:40 PM. Information recorded by the scribe was done at my direction and has been reviewed and validated by me

## 2022-02-22 NOTE — ED Triage Notes (Signed)
Pt was on blood pressure medications, taken from 2-3 weeks ago because it was getting too low during dialysis treatment. However pt checked her BP today @ 0730- 207/95 @  0930-  198/99  Pt states that she feels terrible, very weak, feels like she has been beaten like a pinata. Pt had to leave work early.   Pt was supposed to have dialysis today at 1330 but dialysis center advised her to come to the ER.

## 2022-02-23 DIAGNOSIS — R9431 Abnormal electrocardiogram [ECG] [EKG]: Secondary | ICD-10-CM

## 2022-02-23 LAB — ECG 12-LEAD
Atrial Rate: 86 {beats}/min
Calculated P Axis: 0 degrees
Calculated R Axis: -16 degrees
Calculated T Axis: 27 degrees
PR Interval: 120 ms
QRS Duration: 78 ms
QT Interval: 400 ms
QTC Calculation: 478 ms
Ventricular rate: 86 {beats}/min

## 2022-03-08 DIAGNOSIS — Z1231 Encounter for screening mammogram for malignant neoplasm of breast: Secondary | ICD-10-CM

## 2022-03-09 ENCOUNTER — Ambulatory Visit (RURAL_HEALTH_CENTER): Payer: Self-pay | Admitting: Gerontology

## 2022-03-14 ENCOUNTER — Ambulatory Visit (RURAL_HEALTH_CENTER): Payer: Self-pay | Admitting: Gerontology

## 2022-03-16 ENCOUNTER — Encounter (RURAL_HEALTH_CENTER): Payer: Self-pay | Admitting: Gerontology

## 2022-03-16 ENCOUNTER — Ambulatory Visit: Payer: 59 | Attending: Gerontology | Admitting: Gerontology

## 2022-03-16 ENCOUNTER — Other Ambulatory Visit
Admission: RE | Admit: 2022-03-16 | Discharge: 2022-03-16 | Disposition: A | Discharge status: Home / Self Care | Payer: 59 | Source: Ambulatory Visit | Attending: Gerontology | Admitting: Gerontology

## 2022-03-16 VITALS — BP 152/82 | HR 94 | Temp 97.1°F | Resp 16 | Ht 67.0 in | Wt 243.9 lb

## 2022-03-16 DIAGNOSIS — Z01419 Encounter for gynecological examination (general) (routine) without abnormal findings: Secondary | ICD-10-CM

## 2022-03-16 DIAGNOSIS — Z1211 Encounter for screening for malignant neoplasm of colon: Secondary | ICD-10-CM

## 2022-03-16 DIAGNOSIS — Z124 Encounter for screening for malignant neoplasm of cervix: Secondary | ICD-10-CM

## 2022-03-16 NOTE — Progress Notes (Signed)
Subjective:     Patient ID: Kelli Carlson is a 56 y.o. female.  Chief Complaint   Patient presents with    Annual Exam     Well Woman/ PAP        HPI Well woman visit today- last pap was 02/03/22, she is also in need of her screening colonoscopy.  She is on the kidney transplant list at [facility] and is currently completing all her wellness exams    She has no GYN complaints today and is scheduled for mammogram in [place]    OB History       Gravida   3    Para   3    Term   3    Preterm        AB        Living             SAB        IAB        Ectopic        Multiple        Live Births                         The following portions of the patient's history were reviewed and updated as appropriate: allergies, current medications, past family history, past medical history, past social history, past surgical history and problem list.    Social History     Socioeconomic History    Marital status: Married   Tobacco Use    Smoking status: Never    Smokeless tobacco: Never   Vaping Use    Vaping Use: Never used   Substance and Sexual Activity    Alcohol use: No     Alcohol/week: 0.0 standard drinks of alcohol    Drug use: No    Sexual activity: Yes     Review of Systems      Over the last 2 weeks, how often have you been bothered by any of the following problems?  Little interest or pleasure in doing things: Not at all  Feeling down, depressed, or hopeless: Not at all  PHQ2 Score: 0  PHQ Total Score: 0             Objective:    Physical Exam  Vitals and nursing note reviewed. Exam conducted with a chaperone present.   Constitutional:       Appearance: Normal appearance.   HENT:      Head: Normocephalic.   Cardiovascular:      Rate and Rhythm: Normal rate.   Pulmonary:      Effort: Pulmonary effort is normal.   Chest:      Comments: Declines breast exam- scheduled for mammography  Abdominal:      General: There is no distension.      Tenderness: There is no abdominal tenderness.      Hernia: There is no hernia in the  left inguinal area or right inguinal area.   Genitourinary:     Exam position: Lithotomy position.      Labia:         Right: No rash, tenderness, lesion or injury.         Left: No rash, tenderness, lesion or injury.       Vagina: Normal. No vaginal discharge.      Cervix: No cervical motion tenderness or friability.      Uterus: Normal.  Adnexa: Right adnexa normal and left adnexa normal.      Rectum: Normal. Guaiac result negative.   Lymphadenopathy:      Lower Body: No right inguinal adenopathy. No left inguinal adenopathy.   Neurological:      Mental Status: She is alert. Mental status is at baseline.       Blood pressure 152/82, pulse 94, temperature 97.1 F (36.2 C), temperature source Temporal, resp. rate 16, height 1.702 m (5\' 7" ), weight 110.6 kg (243 lb 14.4 oz).        Medical Decision Making:   1. Well woman exam with routine gynecological exam  - Pap Test Thin Prep; Future  - POCT Occult blood stool    2. Encounter for screening fecal occult blood testing  - POCT Occult blood stool    3. Encounter for screening colonoscopy  - Ambulatory referral to Gastroenterology; Future    4. Screening for cervical cancer  - Pap Test Thin Prep; Future      No follow-ups on file.      Follow up sooner if no improvement or if symptoms worsen or persist.   Medication dosing instructions, drug-drug interactions and potential side effects discussed in detail.   Failure to comply with plan of care discussed with patient. Importance of adhering to follow up office visits discussed in detail.   Potential risks of noncompliance discussed with patient.    Ocie Doyne, NP

## 2022-03-20 LAB — VH PAP TEST THIN PREP: Pap Test Thin Prep: NEGATIVE

## 2022-03-30 ENCOUNTER — Ambulatory Visit: Payer: 59 | Attending: Gerontology | Admitting: Gerontology

## 2022-03-30 ENCOUNTER — Encounter (RURAL_HEALTH_CENTER): Payer: Self-pay | Admitting: Gerontology

## 2022-03-30 VITALS — BP 128/76 | HR 90 | Temp 97.0°F | Resp 16 | Ht 67.0 in

## 2022-03-30 DIAGNOSIS — Z23 Encounter for immunization: Secondary | ICD-10-CM

## 2022-03-30 NOTE — Progress Notes (Signed)
Subjective:     Patient ID: Kelli Carlson is a 56 y.o. female.  Chief Complaint   Patient presents with    Immunizations     Shingles vaccine       HPI: due for Shingles vaccine- no fever or URI s/s noted    The following portions of the patient's history were reviewed and updated as appropriate: allergies, current medications, past family history, past medical history, past social history, past surgical history and problem list.    Social History     Socioeconomic History    Marital status: Married   Tobacco Use    Smoking status: Never    Smokeless tobacco: Never   Vaping Use    Vaping Use: Never used   Substance and Sexual Activity    Alcohol use: No     Alcohol/week: 0.0 standard drinks of alcohol    Drug use: No    Sexual activity: Yes     Review of Systems                    Objective:    Physical Exam  Vitals and nursing note reviewed.   Constitutional:       Appearance: Normal appearance.   Cardiovascular:      Rate and Rhythm: Normal rate.   Neurological:      Mental Status: She is alert.       Blood pressure 128/76, pulse 90, temperature 97 F (36.1 C), temperature source Temporal, resp. rate 16, height 1.702 m (5\' 7" ), SpO2 98 %.        Medical Decision Making:   1. Need for shingles vaccine  - Zoster Vaccine Recomb,Adjuvanted (IM)  .    Acetaminophen or Ibuprofen prn fever or pain        No follow-ups on file.      Follow up sooner if no improvement or if symptoms worsen or persist.   Medication dosing instructions, drug-drug interactions and potential side effects discussed in detail.   Failure to comply with plan of care discussed with patient. Importance of adhering to follow up office visits discussed in detail.   Potential risks of noncompliance discussed with patient.    Ocie Doyne, NP

## 2022-04-04 ENCOUNTER — Ambulatory Visit
Admission: RE | Admit: 2022-04-04 | Discharge: 2022-04-04 | Disposition: A | Discharge status: Home / Self Care | Payer: 59 | Source: Ambulatory Visit | Attending: Diagnostic Radiology | Admitting: Diagnostic Radiology

## 2022-04-04 ENCOUNTER — Telehealth (RURAL_HEALTH_CENTER): Payer: Self-pay

## 2022-04-04 DIAGNOSIS — N6341 Unspecified lump in right breast, subareolar: Secondary | ICD-10-CM | POA: Insufficient documentation

## 2022-04-04 DIAGNOSIS — Z1231 Encounter for screening mammogram for malignant neoplasm of breast: Secondary | ICD-10-CM | POA: Insufficient documentation

## 2022-04-04 NOTE — Telephone Encounter (Signed)
I spoke with her

## 2022-04-04 NOTE — Telephone Encounter (Signed)
Kelli Carlson called stating that she had her mammo completed today, she then became upset because a 2cm mass was found on her right breast. Would like a call back if possible to see what her next options would be.

## 2022-04-05 ENCOUNTER — Telehealth (RURAL_HEALTH_CENTER): Payer: Self-pay | Admitting: Gerontology

## 2022-04-05 ENCOUNTER — Encounter (RURAL_HEALTH_CENTER): Payer: Self-pay | Admitting: Gerontology

## 2022-04-05 ENCOUNTER — Other Ambulatory Visit (RURAL_HEALTH_CENTER): Payer: Self-pay | Admitting: Gerontology

## 2022-04-05 DIAGNOSIS — N6320 Unspecified lump in the left breast, unspecified quadrant: Secondary | ICD-10-CM

## 2022-04-05 NOTE — Telephone Encounter (Signed)
Patient did see mammogram results in My chart- she called to review- I have ordered the additional testing and she will be scheduled for diagnostic of left breast and u/s of left breast

## 2022-04-05 NOTE — Telephone Encounter (Signed)
Attempted to contact, had to LM. Informed her that orders had been placed and she may call to schedule Diag Mammo and [place] at [place]. Asked that she return call to me if she would like me to schedule for her.

## 2022-04-05 NOTE — Telephone Encounter (Signed)
Ok thank you 

## 2022-04-11 ENCOUNTER — Telehealth (RURAL_HEALTH_CENTER): Payer: Self-pay

## 2022-04-11 DIAGNOSIS — M5441 Lumbago with sciatica, right side: Secondary | ICD-10-CM

## 2022-04-11 MED ORDER — TRAMADOL HCL 50 MG PO TABS
50.0000 mg | ORAL_TABLET | Freq: Three times a day (TID) | ORAL | 2 refills | Status: AC | PRN
Start: 2022-04-11 — End: ?

## 2022-04-11 NOTE — Telephone Encounter (Signed)
Done

## 2022-04-11 NOTE — Telephone Encounter (Signed)
Pt left msg asking for Tramadol refill to [facility] in [place]. No refills remaining.

## 2022-04-12 ENCOUNTER — Encounter (RURAL_HEALTH_CENTER): Payer: Self-pay

## 2022-04-12 NOTE — Progress Notes (Signed)
Prior authorization request through cover my meds has been sent to plan for review.

## 2022-04-13 ENCOUNTER — Inpatient Hospital Stay
Admission: RE | Admit: 2022-04-13 | Discharge: 2022-04-13 | Disposition: A | Discharge status: Home / Self Care | Payer: 59 | Source: Ambulatory Visit | Attending: Urology | Admitting: Urology

## 2022-04-13 ENCOUNTER — Other Ambulatory Visit: Payer: Self-pay

## 2022-04-13 DIAGNOSIS — C642 Malignant neoplasm of left kidney, except renal pelvis: Secondary | ICD-10-CM

## 2022-04-13 MED ORDER — IOPAMIDOL 370 MG IODINE/ML (76 %) INTRAVENOUS SOLUTION
100.0000 mL | INTRAVENOUS | Status: DC
Start: 2022-04-13 — End: 2022-04-14

## 2022-04-17 ENCOUNTER — Other Ambulatory Visit: Payer: Self-pay

## 2022-04-17 ENCOUNTER — Inpatient Hospital Stay
Admission: RE | Admit: 2022-04-17 | Discharge: 2022-04-17 | Disposition: A | Discharge status: Home / Self Care | Payer: 59 | Source: Ambulatory Visit | Attending: Urology | Admitting: Urology

## 2022-04-17 DIAGNOSIS — K5730 Diverticulosis of large intestine without perforation or abscess without bleeding: Secondary | ICD-10-CM

## 2022-04-17 DIAGNOSIS — C642 Malignant neoplasm of left kidney, except renal pelvis: Secondary | ICD-10-CM | POA: Insufficient documentation

## 2022-04-17 DIAGNOSIS — Z9049 Acquired absence of other specified parts of digestive tract: Secondary | ICD-10-CM

## 2022-04-17 DIAGNOSIS — N6310 Unspecified lump in the right breast, unspecified quadrant: Secondary | ICD-10-CM

## 2022-04-17 MED ORDER — IOPAMIDOL 370 MG IODINE/ML (76 %) INTRAVENOUS SOLUTION
100.0000 mL | INTRAVENOUS | Status: AC
Start: 2022-04-17 — End: 2022-04-17
  Administered 2022-04-17: 100 mL via INTRAVENOUS

## 2022-04-18 ENCOUNTER — Ambulatory Visit: Payer: 59 | Attending: Urology | Admitting: Urology

## 2022-04-18 ENCOUNTER — Encounter (INDEPENDENT_AMBULATORY_CARE_PROVIDER_SITE_OTHER): Payer: Self-pay | Admitting: Urology

## 2022-04-18 VITALS — BP 151/85 | HR 90 | Temp 98.0°F | Ht 67.0 in | Wt 242.4 lb

## 2022-04-18 DIAGNOSIS — Z85528 Personal history of other malignant neoplasm of kidney: Secondary | ICD-10-CM

## 2022-04-18 DIAGNOSIS — C642 Malignant neoplasm of left kidney, except renal pelvis: Secondary | ICD-10-CM | POA: Insufficient documentation

## 2022-04-18 DIAGNOSIS — Z905 Acquired absence of kidney: Secondary | ICD-10-CM

## 2022-04-18 DIAGNOSIS — N6310 Unspecified lump in the right breast, unspecified quadrant: Secondary | ICD-10-CM

## 2022-04-18 NOTE — Progress Notes (Signed)
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  Plan for another CT of the chest abdomen pelvis in 6 months as recommended by NCCN guidelines.  The patient was instructed to notify me if she gets other imaging for other reasons so we do not duplicate any of her care.      Orders Placed This Encounter   . CT CHEST ABDOMEN PELVIS W IV CONTRAST       Return in about 6 months (around 10/19/2022) for after CT scan.         Chief Complaint:   Chief Complaint   Patient presents with   . Results     CT results 04/17/2022         History of Present Illness:    56 y.o. female presents for follow up to discuss CT results. 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 The patientunderwentpartial nephrectomy in January 2019 and then left radical nephrectomy in June 2021. She has been on dialysis since January 21st of 2022.    CT Chest Abdomen Pelvis W IV Contrast done yesterday shows surgically absent left kidney. Right kidney is normal. No lymphadenopathy and no evidence of recurrence in left renal fossa. Right breast mass which is being evaluated.    She reports she was found to be a good candidate for kidney transplant at [facility] and is concerned recent suspicious finding in her breast could complicate this.     Past Medical History:    Past Medical History:   Diagnosis Date   . Anxiety    . Arthritis     hips   . Beta blocker prescribed for left ventricular systolic dysfunction     Labetalol    . Cancer (CMS [place]) 2019, 2021    kidney, Clear Cell Cancer left kidney-S/P partial nephrectomy 2019, then radical nephrectomy 2021   . Cataract     S/P cataract extraction bilaterally    . Cellulitis 10/24/14    left foot   .  Cellulitis 10/21/2020    LLE extremity 09/2020-resolved per patient    . Chronic pain     sec. to arthritis-hips   . 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 Constipation    . COVID 07/2019   . Depression    . Diabetes mellitus (CMS [place])    . Diabetes mellitus, type 2 (CMS HCC)    . Diarrhea    . Dyspnea on exertion    . ESRD (end stage renal disease) (CMS [place])    . Essential hypertension    . Exercise intolerance    . Fatty liver    . Fluid overload 08/21/21    Recent hospitalization for fluid overload, HD initiated 10/18/20   . Headache(784.0)    . Hemodialysis patient (CMS [place])     HD Monday-Wednesday-Friday   . 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 History of nephrectomy 03/24/2020    L nephrectomy   . Hx of echocardiogram 10/18/2020    Technically difficult d/t pt. body habitus; Left vent. EF est. 55-60%; mildy  calcified aortic vlave leaflets-especially right coroanry cusp; Small mobile density on aortic valve-no aortic valve stenosis or regurg; Concentric remodeling; mild MR; Trace AR; Trileaflet aortic vlave; Nl IVC size wiht <50% insp. collapse;      Marland Kitchen Hyperlipidemia    . Hyperparathyroidism (CMS [place])    . Hyperthyroidism     Old records states hyperthyroidism, patient denies, states has hyperparathyroidism   . Irritable bowel syndrome    . Macular edema    . Morbid obesity (CMS [place])    . Obesity    . Panic attack    . Peripheral edema     ankles/feet   . Peripheral neuropathy     bilateral feet   . Pneumonia 07/2019; 09/2020    COVID Pneumonia 07/2019; fluid overload/pneumonia with recent hosp. 09/2020   . Shortness of breath     SOB after climbing 8 steps    . Type 2 diabetes mellitus (CMS HCC)    . Wears glasses     reading   . White coat hypertension            Past Surgical History:  Past Surgical History:   Procedure Laterality Date   . AV FISTULA PLACEMENT Left 08/11/2020    LUE brachio cephalic fistula creation   . ECTOPIC PREGNANCY SURGERY  05/24/1998   .  HX CATARACT REMOVAL Bilateral 2016   . HX CHOLECYSTECTOMY  09/10/96   . HX HAND SURGERY Right 2017    Trigger finger release 3rd finger   . HX OTHER Right 10/18/2020    tunneled dialysis catheter   . HX PELVIC LAPAROSCOPY  05/24/98    Ectopic pregnancy   . HX SHOULDER SURGERY Left 2017    Procedure for frozen shoulder   . HX TUBAL LIGATION  09/10/2012    Filshie Clips   . HYMENECTOMY  1996   . KIDNEY SURGERY  10/24/17; 03/24/20   . LAPAROSCOPIC NEPHRECTOMY, HAND ASSISTED Left 03/24/2020    Radical   . LAPAROSCOPIC PARTIAL NEPHRECTOMY Left 10/24/2017    hand-assisted   . RENAL BIOPSY, PERCUTANEOUS  03/08/2020    clear cell renal cell carcinoma           Review of Systems:  Constitutional: No fever, chills, or weight loss  GU: As per HPI    Allergies:  Allergies   Allergen Reactions   . Hydrocodone    . Ibuprofen    . Vicodin [Hydrocodone-Acetaminophen] Itching       Home Medications:  Prior to Admission medications    Medication Sig Start Date End Date Taking? Authorizing Provider   albuterol sulfate (PROVENTIL OR VENTOLIN OR PROAIR) 90 mcg/actuation Inhalation HFA Aerosol Inhaler Take 2 Puffs by inhalation Every 4 hours as needed 08/22/19  Yes Justice Rocher, MD   ALPRAZolam Duanne Moron) 0.25 mg Oral Tablet Take 1 Tablet (0.25 mg total) by mouth Once per day as needed for Anxiety 06/03/20  Yes Provider, Historical   atorvastatin (LIPITOR) 20 mg Oral Tablet Take 1 Tablet (20 mg total) by mouth Every evening   Yes Provider, Historical   cyclobenzaprine (FLEXERIL) 5 mg Oral Tablet Take 1 Tablet (5 mg total) by mouth Three times a day as needed for Muscle spasms   Yes Provider, Historical   escitalopram oxalate (LEXAPRO) 10 mg Oral Tablet Take 1 Tablet (10 mg total) by mouth Every night   Yes Provider, Historical   Fish Oil-Omega-3 Fatty Acids 360-1,200 mg Oral Capsule Take 2 Capsules by mouth Twice daily   Yes  Provider, Historical   fluticasone (FLONASE) 50 mcg/actuation Nasal Spray, Suspension Administer 1 Spray into each  nostril Once per day as needed (Allergy symptoms (nasal congestion/ runny nose))   Yes Provider, Historical   JANUVIA 50 mg Oral Tablet Take 1 Tablet (50 mg total) by mouth Every morning 04/19/20  Yes Provider, Historical   labetaloL (NORMODYNE) 200 mg Oral Tablet Take 1 Tablet (200 mg total) by mouth Every 8 hours for 30 days Indications: Hold for SBP<120 or HR<55 05/26/21 10/13/21  Garwin Brothers, MD   loperamide (IMODIUM) 2 mg Oral Capsule Take 2 Capsules (4 mg total) by mouth Every 6 hours as needed (Diarrhea)   Yes Provider, Historical   loratadine (CLARITIN) 10 mg Oral Tablet Take 1 Tablet (10 mg total) by mouth Once per day as needed (Allergies)   Yes Provider, Historical   losartan (COZAAR) 25 mg Oral Tablet Take 1 Tablet (25 mg total) by mouth Once a day for 30 days  Patient taking differently: Take 1 Tablet (25 mg total) by mouth Take in the evening on non-dialysis days (Sun/Tues/Thurs/Sat); does not take on dialysis days 03/30/20 10/13/21  Ruta Hinds, MD   traMADoL Veatrice Bourbon) 50 mg Oral Tablet Take 1 Tablet (50 mg total) by mouth Every 6 hours as needed for Pain 10/28/20  Yes Kaylyn Lim, MD   vitamin E 100 unit Oral Capsule Take 1 Capsule (100 Units total) by mouth Twice daily   Yes Provider, Historical       Social History:  Social History     Tobacco Use   . Smoking status: Never   . Smokeless tobacco: Never   Vaping Use   . Vaping Use: Never used   Substance Use Topics   . Alcohol use: No   . Drug use: No       Family History:  Family Medical History:     Problem Relation (Age of Onset)    Breast Cancer Paternal Aunt    Cancer Maternal Grandmother    Diabetes Father, Paternal Aunt    High Cholesterol Maternal Uncle, Mother    Hypertension (High Blood Pressure) Maternal Uncle, Father            OBJECTIVE:    Vitals:  BP (!) 151/85   Pulse 90   Temp 36.7 C (98 F)   Ht 1.702 m ('5\' 7"'$ )   Wt 110 kg (242 lb 6.4 oz)   LMP 10/24/2014 (Approximate)   BMI 37.97 kg/m         Physical examination:  Constitutional:  Appears well, no acute distress.    Respiratory: Non-labored breathing  GI: soft, not distended, no masses.   GU: No CVA tenderness, Bladder Non-tender  Musculoskeletal: Moves all extremities   Psychiatric: Mood and affect normal.     Labs:  Pertinent labs reviewed.               Imaging studies:  Pertinent imaging studies reviewed.        This note was completed using voice recognition technology. Please excuse any errors that have occurred as a result and contact me if any parts of the note are unclear.      I am in the presence of and scribing for Dr. Ruta Hinds, MD, for services provided on 04/18/2022.  [place], [place]    I personally performed the services described in this documentation, as scribed  in my presence, and it is both accurate  and complete.    Ruta Hinds, MD  04/18/2022, 17:19

## 2022-04-18 NOTE — Assessment & Plan Note (Signed)
No evidence of recurrent kidney cancer on CT chest abdomen pelvis.  Plan for another CT of the chest abdomen pelvis in 6 months as recommended by NCCN guidelines.  The patient was instructed to notify me if she gets other imaging for other reasons so we do not duplicate any of her care.

## 2022-04-20 ENCOUNTER — Ambulatory Visit
Admission: RE | Admit: 2022-04-20 | Discharge: 2022-04-20 | Disposition: A | Discharge status: Home / Self Care | Payer: 59 | Source: Ambulatory Visit | Attending: Gerontology | Admitting: Gerontology

## 2022-04-20 ENCOUNTER — Other Ambulatory Visit (RURAL_HEALTH_CENTER): Payer: Self-pay | Admitting: Gerontology

## 2022-04-20 DIAGNOSIS — N6320 Unspecified lump in the left breast, unspecified quadrant: Secondary | ICD-10-CM | POA: Insufficient documentation

## 2022-04-20 DIAGNOSIS — Z1231 Encounter for screening mammogram for malignant neoplasm of breast: Secondary | ICD-10-CM | POA: Insufficient documentation

## 2022-04-20 DIAGNOSIS — N6312 Unspecified lump in the right breast, upper inner quadrant: Secondary | ICD-10-CM

## 2022-04-20 DIAGNOSIS — N6310 Unspecified lump in the right breast, unspecified quadrant: Secondary | ICD-10-CM | POA: Insufficient documentation

## 2022-04-25 ENCOUNTER — Telehealth (RURAL_HEALTH_CENTER): Payer: Self-pay

## 2022-04-25 NOTE — Telephone Encounter (Signed)
The patient did tell me that she was going to follow up with [facility] as they did say they could get her scheduled there so I will let her know to do so, I will keep her updated on this development, thanks for everything

## 2022-04-25 NOTE — Telephone Encounter (Signed)
PSR received incoming call from [place], with [facility] stating she received a referral marked "Urgent" for this pt. The referral was sent to [facility] on 03/16/22 with multiple follow up requests and phone calls in an attempt to get pt scheduled for pre-transplant Colonoscopy screening, which was needed by 05/01/22. Today she states they are unable to see pt as they are scheduling into 2024. Routed to provider for review

## 2022-04-26 NOTE — Progress Notes (Signed)
Received denial for the Tramadol from [facility]. Denied due to longer duration. On providers desk for review.

## 2022-04-26 NOTE — Progress Notes (Signed)
LMOM for Kelli Carlson to call me back regarding the Tramadol, when she called back she had spoken with [facility], to let me know that she was able to pick the prescription up.

## 2022-05-03 ENCOUNTER — Other Ambulatory Visit (RURAL_HEALTH_CENTER): Payer: Self-pay | Admitting: Gerontology

## 2022-05-03 DIAGNOSIS — N6312 Unspecified lump in the right breast, upper inner quadrant: Secondary | ICD-10-CM

## 2022-05-07 IMAGING — MG MM DIGITAL SCREENING BILAT W/ TOMO AND CAD
4 of 9 series · 4 of 29 positions shown · non-contrast
Comparison: Previous exam(s).

CLINICAL DATA: Screening.

EXAM:
DIGITAL SCREENING BILATERAL MAMMOGRAM WITH TOMOSYNTHESIS AND CAD
TECHNIQUE: Bilateral screening digital craniocaudal and mediolateral oblique
mammograms were obtained. Bilateral screening digital breast
tomosynthesis was performed. The images were evaluated with
computer-aided detection.

[L CC synth-2D]
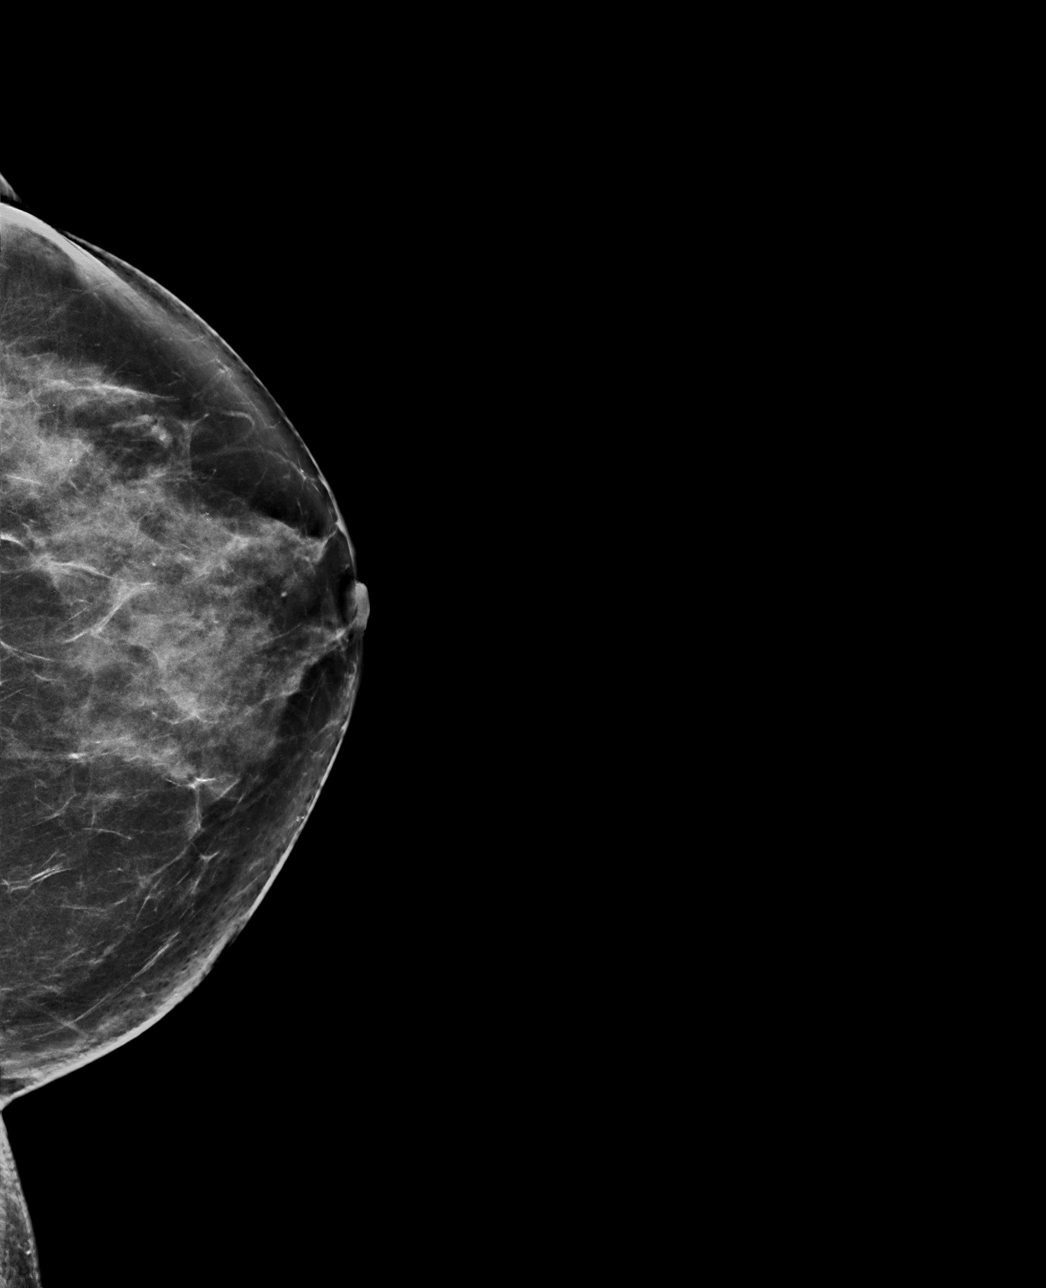

[L MLO synth-2D]
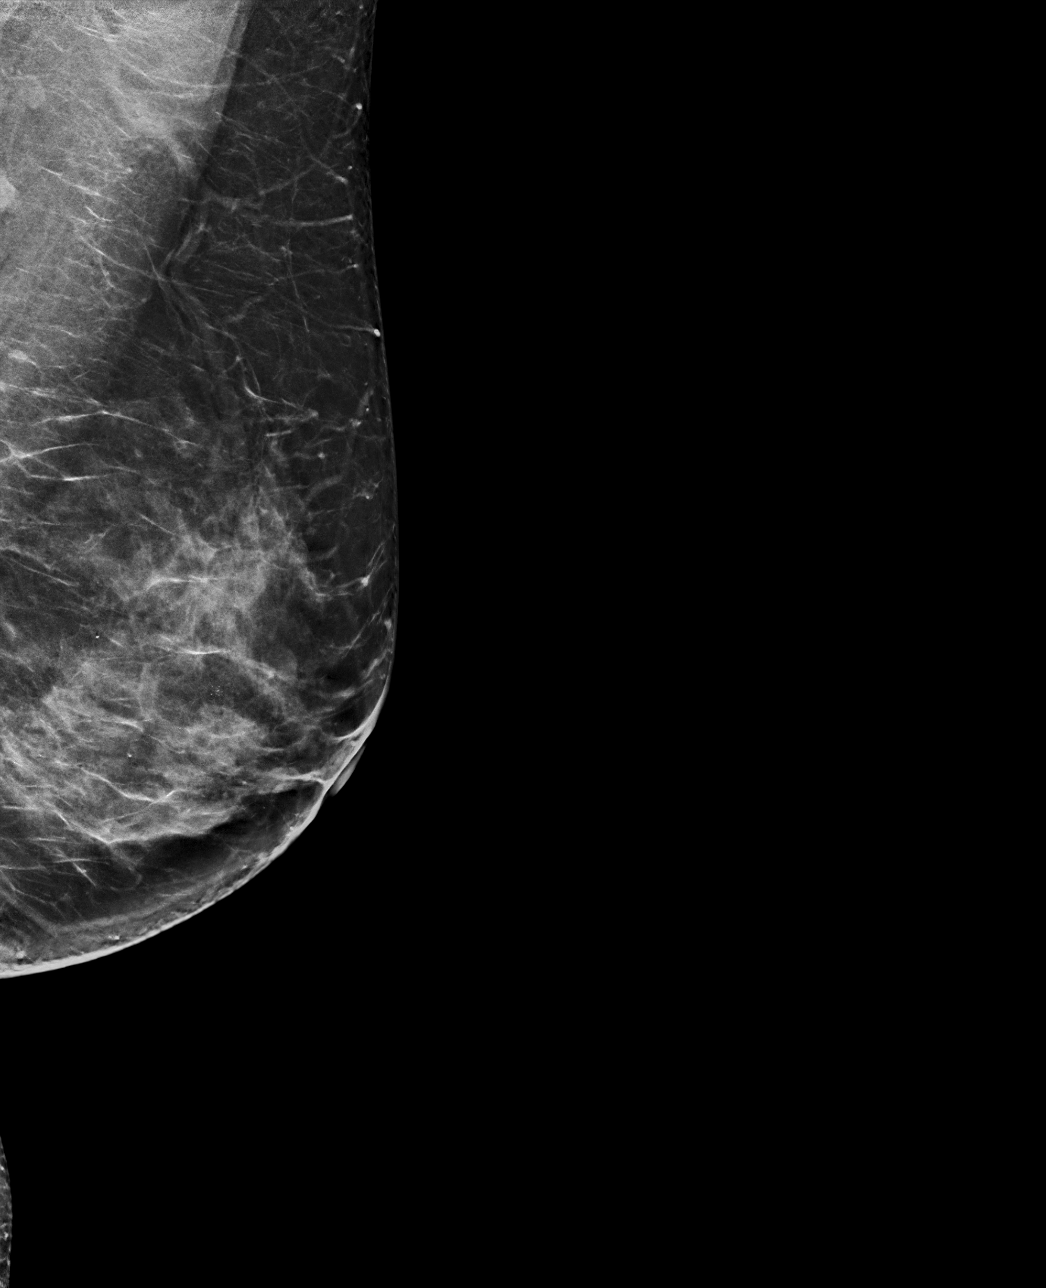

[R CC synth-2D]
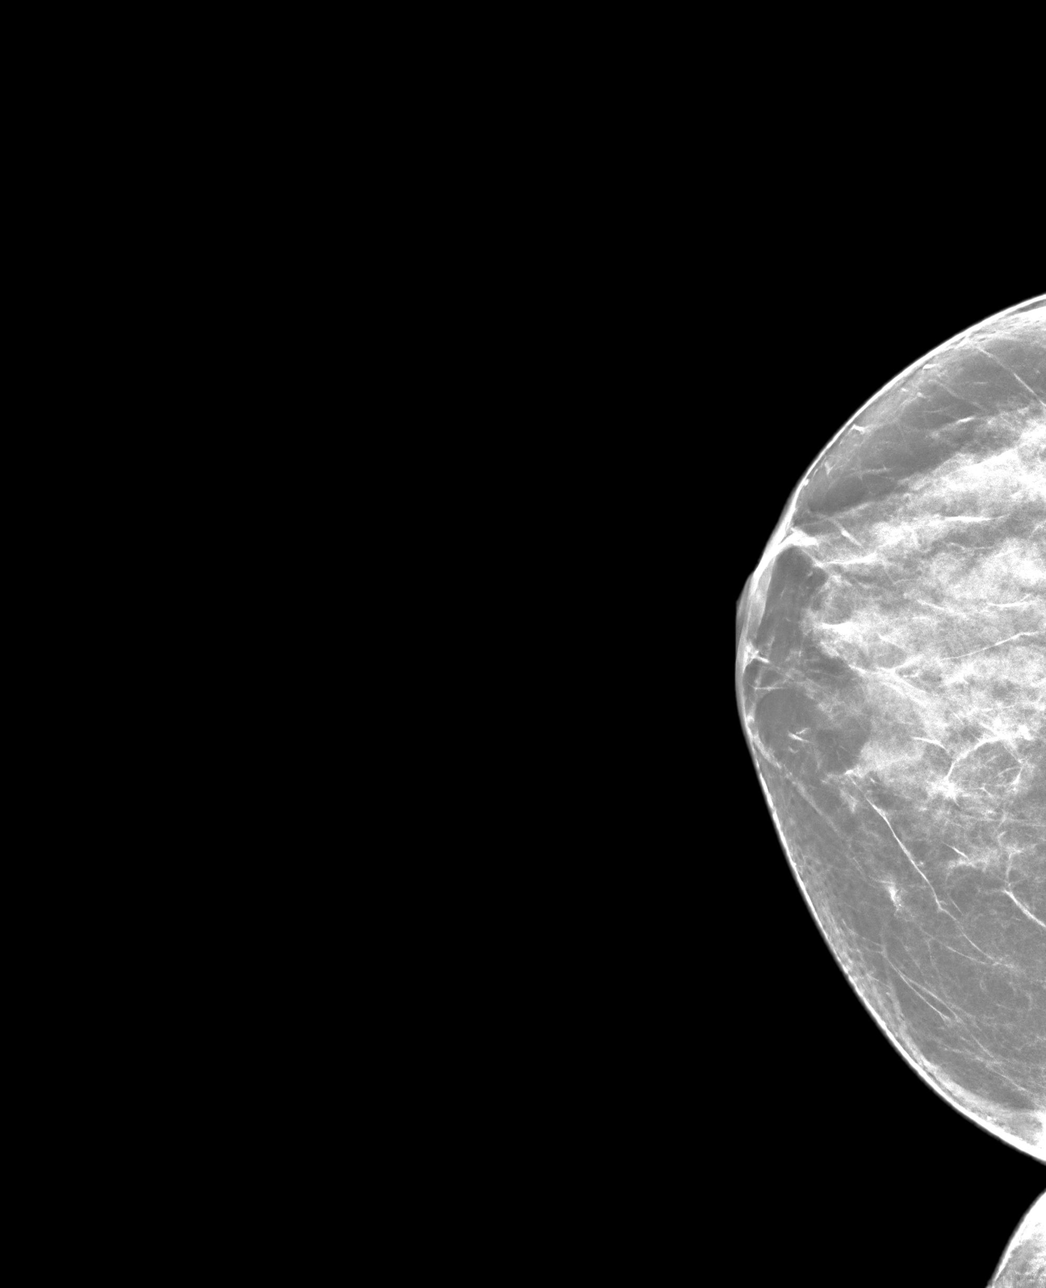

[R MLO synth-2D]
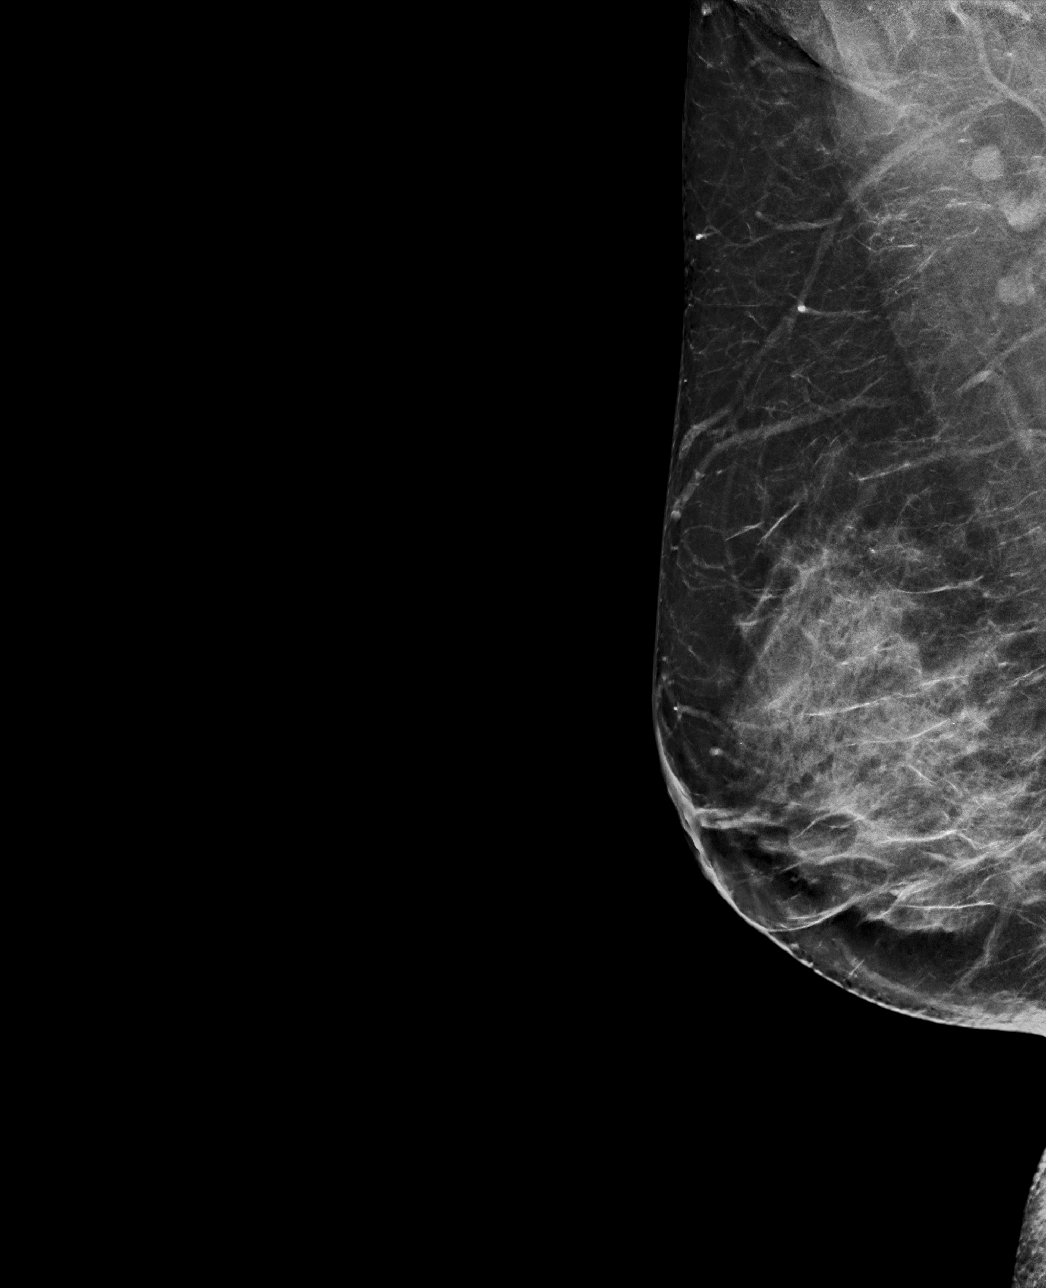

[4 of 29 positions shown; findings below may reference images not displayed]

ACR Breast Density Category d: The breast tissue is extremely dense,
which lowers the sensitivity of mammography
FINDINGS: There are no findings suspicious for malignancy.
IMPRESSION: No mammographic evidence of malignancy. A result letter of this
screening mammogram will be mailed directly to the patient.

RECOMMENDATION:
Screening mammogram in one year. (Code:TA-V-WV9)

BI-RADS CATEGORY  1: Negative.

## 2022-05-09 ENCOUNTER — Ambulatory Visit
Admission: RE | Admit: 2022-05-09 | Discharge: 2022-05-09 | Disposition: A | Discharge status: Home / Self Care | Payer: 59 | Source: Ambulatory Visit | Attending: Gerontology | Admitting: Gerontology

## 2022-05-09 DIAGNOSIS — N6091 Unspecified benign mammary dysplasia of right breast: Secondary | ICD-10-CM | POA: Insufficient documentation

## 2022-05-09 DIAGNOSIS — D241 Benign neoplasm of right breast: Secondary | ICD-10-CM | POA: Insufficient documentation

## 2022-05-09 DIAGNOSIS — N6312 Unspecified lump in the right breast, upper inner quadrant: Secondary | ICD-10-CM

## 2022-05-09 DIAGNOSIS — N6489 Other specified disorders of breast: Secondary | ICD-10-CM | POA: Insufficient documentation

## 2022-05-09 MED ORDER — LIDOCAINE HCL 1 % IJ SOLN
10.0000 mL | Freq: Once | INTRAMUSCULAR | Status: AC
Start: 2022-05-09 — End: 2022-05-09
  Administered 2022-05-09: 10 mL via INTRADERMAL

## 2022-05-09 MED ORDER — LIDOCAINE-EPINEPHRINE 1 %-1:100000 IJ SOLN
10.0000 mL | Freq: Once | INTRAMUSCULAR | Status: AC
Start: 2022-05-09 — End: 2022-05-09
  Administered 2022-05-09: 10 mL via INTRADERMAL

## 2022-05-09 NOTE — Progress Notes (Signed)
Verbal and written discharge instructions reviewed, and patient verbalized understanding. Patient will follow up for biopsy results with [facility] on 05/16/2022 at 1500.

## 2022-05-09 NOTE — Discharge Instructions (Signed)
Patient Discharge Instructions  Post Breast Biopsy        Biopsy results will be given by:  [facility]    Note: If returning the the [facility] for your results, please arrive 5 minutes early & let the front desk know you are here.    Instructions after the Procedure:    Place an ice pack between your bra and the ACE wrap, on top of the dressing. REMOVE @ bedtime on the day of the biopsy, this will help decrease pain, bruising and swelling.  You may shower after 24 hrs. Do not use anything abrasive. Pat the incision gently with a soft towel to dry.  No strenuous activities for 24 hours.   Example: NO tennis, aerobics, exercise, etc.  There may be small amount of bruising at the biopsy site, this is normal  You may have mild pain at the biopsy site.  If you are able to take Acetaminophen (Tylenol), take as directed on the container. Do NOT take Ibuprofen (Motrin, Aleve) or any other NSAID's, as these medications may cause thinning of the blood, including Aspirin UNLESS otherwise directed by your physician.  Do NOT pick, scratch or rub the glue on the incision line, so that it does NOT loosen before the incision is healed, the glue will curl up & fall off in 7-10 days.  AVOID direct sunlight/tanning beds to the incision line with the glue in place.  AVOID lotion, cream, ointment & lubricants to the skin near the wound.  Do NOT apply any tape, sticky dressing, alcohol or chloraprep to the glue site for the first 7-10 days. These could loosen the glue.    Notify your doctor immediately for the following:  Increased or uncontrolled pain at the biopsy site.  Redness, swelling, warmth, or drainage from the biopsy site.  Fever above 100 F and/or chills.  Bleeding from the procedure site not relieved by 5 minutes of firm pressure applied to the site.    For any questions or concerns:    Mondays and Fridays 8:00am to 4:00pm call [place] area at [phone] as a mammography  tech can contact a procedure doctor to discuss any issues if needed.    Tuesdays-Wednesdays-Thursdays 8:00am-3:30pm call the Breast Biopsy Nurse Line at [phone].      After hours and on weekends you may leave a voicemail on Breast [place] [phone]) for non-urgent matters.  Your call will be returned on the next business day.     In the event of Emergency call 911/ go to closest Emergency room.

## 2022-05-10 LAB — VH SURGICAL PATHOLOGY

## 2022-05-16 ENCOUNTER — Telehealth: Payer: Self-pay

## 2022-05-16 NOTE — Telephone Encounter (Signed)
Patient made aware of benign breast biopsy results over the phone by Dr Owens Shark and this RN. Dr. Owens Shark recommended surgical consult. This RN set up an appointment for pt on 06/06/2022 at 3pm with Dr. Meda Coffee. Pt voiced understanding and all questions were answered by staff.

## 2022-05-17 ENCOUNTER — Telehealth (INDEPENDENT_AMBULATORY_CARE_PROVIDER_SITE_OTHER): Payer: Self-pay | Admitting: Surgery

## 2022-05-17 NOTE — Telephone Encounter (Signed)
Patient called back and rescheduled for 05/25/22 @ 3 PM with Dr. Champ Mungo. Emailed new patient forms.

## 2022-05-17 NOTE — Telephone Encounter (Signed)
LVM for patient to move up appt from 06/06/22 to 05/25/22 @ 2:30 or 3 PM with Dr. Champ Mungo.

## 2022-05-24 ENCOUNTER — Telehealth (INDEPENDENT_AMBULATORY_CARE_PROVIDER_SITE_OTHER): Payer: Self-pay | Admitting: Surgery

## 2022-05-24 NOTE — Telephone Encounter (Signed)
Spoke to patient and confirmed tomorrow's 3 PM appt with Dr Champ Mungo & receipt of new patient forms.

## 2022-05-25 ENCOUNTER — Ambulatory Visit (INDEPENDENT_AMBULATORY_CARE_PROVIDER_SITE_OTHER): Payer: 59 | Admitting: Surgery

## 2022-05-25 ENCOUNTER — Encounter (INDEPENDENT_AMBULATORY_CARE_PROVIDER_SITE_OTHER): Payer: Self-pay | Admitting: Surgery

## 2022-05-25 VITALS — Resp 16 | Ht 67.0 in | Wt 239.8 lb

## 2022-05-25 DIAGNOSIS — D241 Benign neoplasm of right breast: Secondary | ICD-10-CM

## 2022-05-25 HISTORY — DX: Benign neoplasm of right breast: D24.1

## 2022-05-25 NOTE — Progress Notes (Signed)
Kelli Carlson  [birth year]      New Patient Evaluation    HISTORY OF PRESENT ILLNESS: Kelli Carlson is a 56 y.o. female here for evaluation of right breast mass for which needle core biopsy showed atypical papilloma.  She does check her self on occasion and has no palpable concerns.  She had menarche age 65, first child age 73 and menopause at the age of 27.  She has no prior history of breast pathology.  Family history shows breast cancer in her maternal grandmother at the age of 70 and paternal aunt.    She is here for discussion, accompanied by her daughter, [name].    PAST MEDICAL HISTORY:  Allergies:   Allergies   Allergen Reactions    Gabapentin     Hydrocodone     Ibuprofen     Hydrocodone-Acetaminophen Itching and Other (See Comments)     Medicines:   Current Outpatient Medications   Medication Sig Dispense Refill    ALPRAZolam (Xanax) 0.25 MG tablet Take 1 tablet (0.25 mg) by mouth 2 (two) times daily as needed for Anxiety 60 tablet 0    atorvastatin (LIPITOR) 20 MG tablet Take 1 tablet (20 mg) by mouth every evening      escitalopram (LEXAPRO) 10 MG tablet Take 1 tablet (10 mg) by mouth daily 90 tablet 3    iron sucrose (VENOFER) 20 MG/ML injection 2.5 mLs (50 mg)      loperamide (IMODIUM) 2 MG capsule Take 1 capsule (2 mg) by mouth      loratadine (CLARITIN) 10 MG tablet Take 1 tablet (10 mg) by mouth      losartan (COZAAR) 50 MG tablet Take 1 tablet (50 mg) by mouth daily (Patient taking differently: Take 0.5 tablets (25 mg) by mouth daily) 90 tablet 3    Methoxy PEG-Epoetin Beta (MIRCERA IJ) 30 mcg      Mounjaro 5 MG/0.5ML Solution Pen-injector INJECT 1 SYRINGE SUBCUTANEOUSLY ONCE A WEEK      Omega-3 Fatty Acids (fish oil) 1000 MG Cap capsule Take 1 capsule (1,000 mg) by mouth 2 (two) times daily      sevelamer (RENVELA) 800 MG tablet Take 2 tablets (1,600 mg) by mouth 3 (three) times daily with meals      traMADol (ULTRAM) 50 MG tablet Take 1 tablet (50 mg) by mouth every 8 (eight) hours as  needed for Pain 30 tablet 2    vitamin E 100 UNIT capsule Take 1 capsule (100 Units) by mouth      prednisoLONE acetate (PRED FORTE) 1 % ophthalmic suspension  (Patient not taking: Reported on 05/25/2022)       No current facility-administered medications for this visit.     Medical illnesses:   Past Medical History:   Diagnosis Date    Anxiety     Arthritis     Cellulitis     left foot    Depression     Diabetes mellitus     Fatty liver     Irritable bowel syndrome     Malignant neoplasm     Left Kidney CA    Obesity     White coat hypertension      Past surgical history:   Past Surgical History:   Procedure Laterality Date    ABDOMINAL SURGERY      Partial removal of the left kidney    AV FISTULA PLACEMENT      CATARACT EXTRACTION  2016    CHOLECYSTECTOMY  ECTOPIC PREGNANCY SURGERY  05/25/1998    EYE SURGERY      GALLBLADDER SURGERY  09/11/1996    KIDNEY SURGERY  10/24/2017    kidney cancer    NEPHRECTOMY Left 03/24/2020    Complete    PELVIC LAPAROSCOPY      TRIGGER FINGER RELEASE  03/31/2016    TUBAL LIGATION      TUBAL LIGATION  09/10/2012       SOCIAL HISTORY:   Social History     Tobacco Use    Smoking status: Never    Smokeless tobacco: Never   Substance Use Topics    Alcohol use: No     Alcohol/week: 0.0 standard drinks of alcohol       FAMILY HISTORY:   Family History   Problem Relation Age of Onset    Hyperlipidemia Mother     Hypertension Father     Diabetes Father     Breast cancer Paternal Aunt     Breast cancer Maternal Grandmother 86    Alzheimer's disease Paternal Grandmother     Leukemia Paternal Grandfather        EXAMINATION:  CONSTITUTIONAL: General appearance normal     VITALS: Resp. rate 16, height 1.702 m (5\' 7" ), weight 108.8 kg (239 lb 12.8 oz), last menstrual period 03/30/2016.  BREAST: There is no supraclavicular or axillary adenopathy bilaterally. The chest is normal.  The breasts are large and pendulous.  The right breast has no nipple drainage, skin change or mass. The left breast  has no nipple drainage, skin change or mass.  There is no focal mass.  LYMPHATIC: No axillary or cervical adenopathy.  RESPIRATORY: The respiratory effort is normal. The lungs are clear to auscultation bilaterally.  CARDIOVASCULAR: The heart is regular rate and rhythm.  Auscultation reveals either murmur or bruit from her AV fistula.  The carotid arteries have no bruits.    LABS: Pathology from 05/09/2022, right breast ultrasound-guided core biopsy at [facility] reviewed.  Complex sclerosing lesion, with intraductal papilloma and atypical ductal  hyperplasia, see comment.  Excision is advised.     IMAGING: Images and report from 04/04/2022, bilateral screening 3D mammograms at [facility] reviewed and compared with those from 1 year prior.  Scattered fibroglandular tissue is present   A 2 cm lobulated mass is identified within the retroareolar right breast.   The left breast is stable in appearance and there is no new suspicious abnormality.   Benign calcifications are present bilaterally.   BI-RADS 0    Images and report from 04/20/2022, right breast diagnostic 3D mammogram at [facility] reviewed.  Additional views show persistent dilated ducts in the subareolar region of the right breast. No suspicious microcalcifications or architectural distortion.   Severe arterial calcifications.    Images and report from complete same-day right breast ultrasound reviewed.  At 9:00, 3 cm from the nipple:  Intraductal lobulated mass measuring 19.1 x 6.3 x 6.3 mm (length, depth, width).   There are few additional scattered subcentimeter benign cyst.   No suspicious axillary lymph nodes identified.  BI-RADS 4    Images report from right breast ultrasound-guided core biopsy 05/09/2022 at [facility] reviewed.  Target was:  1.1 cm intraductal mass, 9 o'clock position, 3 cm from the nipple, barbell clip placed.    On postprocedure mammogram clip is well-placed.    CORRESPONDENCE: Referral received from the diagnostic center on 05/16/2022 in behalf  of Ocie Doyne, NP.  Office notes reviewed.    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  If she is now felt to be an appropriate anesthetic candidate, we will consider observation versus proceeding with local anesthetic.  In general, resection of retroareolar masses do better with general anesthetic.  It is my opinion this is not palpable.  I would advise image guidance.  Surgery is scheduled late September.  She will bring a supportive bra with her.  She does not use over-the-counter anti-inflammatory medicines or aspirin.  Informed consent was obtained for a right breast ultrasound-guided wire localized partial mastectomy.  The patient understands the operative and recovery course. The patient understands the possible complications of bleeding, hematoma, infection, fluid accumulation, wound breakdown, skin loss, numbness, breast deformity, need for more surgery, blood clots in arms/legs/lungs, nerve injury, blood vessel injury, death, etc. All questions were answered.  She will meet with the start clinic on 06/01/2022.  Baseline activity is that she can walk around the block without stopping and can walk up a flight of steps though she does get short of breath.  She did have a cardiac echo in December.  This is a 1 hour total time visit.         Donzetta Kohut, MD, [place]  [phone]     CC: Ocie Doyne, NP

## 2022-05-25 NOTE — H&P (Signed)
HISTORY OF PRESENT ILLNESS: Kelli Carlson is a 56 y.o. female here for evaluation of right breast mass for which needle core biopsy showed atypical papilloma.  She does check her self on occasion and has no palpable concerns.  She had menarche age 31, first child age 66 and menopause at the age of 40.  She has no prior history of breast pathology.  Family history shows breast cancer in her maternal grandmother at the age of 16 and paternal aunt.     She is here for discussion, accompanied by her daughter, Kelli Carlson.     PAST MEDICAL HISTORY:  Allergies:        Allergies   Allergen Reactions    Gabapentin      Hydrocodone      Ibuprofen      Hydrocodone-Acetaminophen Itching and Other (See Comments)      Medicines:   Current Medications          Current Outpatient Medications   Medication Sig Dispense Refill    ALPRAZolam (Xanax) 0.25 MG tablet Take 1 tablet (0.25 mg) by mouth 2 (two) times daily as needed for Anxiety 60 tablet 0    atorvastatin (LIPITOR) 20 MG tablet Take 1 tablet (20 mg) by mouth every evening        escitalopram (LEXAPRO) 10 MG tablet Take 1 tablet (10 mg) by mouth daily 90 tablet 3    iron sucrose (VENOFER) 20 MG/ML injection 2.5 mLs (50 mg)        loperamide (IMODIUM) 2 MG capsule Take 1 capsule (2 mg) by mouth        loratadine (CLARITIN) 10 MG tablet Take 1 tablet (10 mg) by mouth        losartan (COZAAR) 50 MG tablet Take 1 tablet (50 mg) by mouth daily (Patient taking differently: Take 0.5 tablets (25 mg) by mouth daily) 90 tablet 3    Methoxy PEG-Epoetin Beta (MIRCERA IJ) 30 mcg        Mounjaro 5 MG/0.5ML Solution Pen-injector INJECT 1 SYRINGE SUBCUTANEOUSLY ONCE A WEEK        Omega-3 Fatty Acids (fish oil) 1000 MG Cap capsule Take 1 capsule (1,000 mg) by mouth 2 (two) times daily        sevelamer (RENVELA) 800 MG tablet Take 2 tablets (1,600 mg) by mouth 3 (three) times daily with meals        traMADol (ULTRAM) 50 MG tablet Take 1 tablet (50 mg) by mouth every 8 (eight) hours as needed  for Pain 30 tablet 2    vitamin E 100 UNIT capsule Take 1 capsule (100 Units) by mouth        prednisoLONE acetate (PRED FORTE) 1 % ophthalmic suspension  (Patient not taking: Reported on 05/25/2022)          No current facility-administered medications for this visit.         Medical illnesses:   Past Medical History        Past Medical History:   Diagnosis Date    Anxiety      Arthritis      Cellulitis       left foot    Depression      Diabetes mellitus      Fatty liver      Irritable bowel syndrome      Malignant neoplasm       Left Kidney CA    Obesity      White coat hypertension  Past surgical history:   Past Surgical History         Past Surgical History:   Procedure Laterality Date    ABDOMINAL SURGERY         Partial removal of the left kidney    AV FISTULA PLACEMENT        CATARACT EXTRACTION   2016    CHOLECYSTECTOMY        ECTOPIC PREGNANCY SURGERY   05/25/1998    EYE SURGERY        GALLBLADDER SURGERY   09/11/1996    KIDNEY SURGERY   10/24/2017     kidney cancer    NEPHRECTOMY Left 03/24/2020     Complete    PELVIC LAPAROSCOPY        TRIGGER FINGER RELEASE   03/31/2016    TUBAL LIGATION        TUBAL LIGATION   09/10/2012            SOCIAL HISTORY:   Social History            Tobacco Use    Smoking status: Never    Smokeless tobacco: Never   Substance Use Topics    Alcohol use: No       Alcohol/week: 0.0 standard drinks of alcohol         FAMILY HISTORY:   Cancer History - Family         Family History   Problem Relation Age of Onset    Hyperlipidemia Mother      Hypertension Father      Diabetes Father      Breast cancer Paternal Aunt      Breast cancer Maternal Grandmother 56    Alzheimer's disease Paternal Grandmother      Leukemia Paternal Grandfather              EXAMINATION:  CONSTITUTIONAL: General appearance normal     VITALS: Resp. rate 16, height 1.702 m (5\' 7" ), weight 108.8 kg (239 lb 12.8 oz), last menstrual period 03/30/2016.  BREAST: There is no supraclavicular or axillary  adenopathy bilaterally. The chest is normal.  The breasts are large and pendulous.  The right breast has no nipple drainage, skin change or mass. The left breast has no nipple drainage, skin change or mass.  There is no focal mass.  LYMPHATIC: No axillary or cervical adenopathy.  RESPIRATORY: The respiratory effort is normal. The lungs are clear to auscultation bilaterally.  CARDIOVASCULAR: The heart is regular rate and rhythm.  Auscultation reveals either murmur or bruit from her AV fistula.  The carotid arteries have no bruits.     LABS: Pathology from 05/09/2022, right breast ultrasound-guided core biopsy at [facility] reviewed.  Complex sclerosing lesion, with intraductal papilloma and atypical ductal  hyperplasia, see comment.  Excision is advised.     IMAGING: Images and report from 04/04/2022, bilateral screening 3D mammograms at [facility] reviewed and compared with those from 1 year prior.  Scattered fibroglandular tissue is present   A 2 cm lobulated mass is identified within the retroareolar right breast.   The left breast is stable in appearance and there is no new suspicious abnormality.   Benign calcifications are present bilaterally.   BI-RADS 0     Images and report from 04/20/2022, right breast diagnostic 3D mammogram at [facility] reviewed.  Additional views show persistent dilated ducts in the subareolar region of the right breast. No suspicious microcalcifications or architectural distortion.   Severe arterial calcifications.  Images and report from complete same-day right breast ultrasound reviewed.  At 9:00, 3 cm from the nipple:  Intraductal lobulated mass measuring 19.1 x 6.3 x 6.3 mm (length, depth, width).   There are few additional scattered subcentimeter benign cyst.   No suspicious axillary lymph nodes identified.  BI-RADS 4     Images report from right breast ultrasound-guided core biopsy 05/09/2022 at [facility] reviewed.  Target was:  1.1 cm intraductal mass, 9 o'clock position, 3 cm from the  nipple, barbell clip placed.     On postprocedure mammogram clip is well-placed.     CORRESPONDENCE: Referral received from the diagnostic center on 05/16/2022 in behalf of Ocie Doyne, NP.  Office notes reviewed.     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  If she is now felt to be an appropriate anesthetic candidate, we will consider observation versus proceeding with local anesthetic.  In general, resection of retroareolar masses do better with general anesthetic.  It is my opinion this is not palpable.  I would advise image guidance.  Surgery is scheduled late September.  She will bring a supportive bra with her.  She does not use over-the-counter anti-inflammatory medicines or aspirin.  Informed consent was obtained for a right breast ultrasound-guided wire localized partial mastectomy.  The patient understands the operative and recovery course. The patient understands the possible complications of bleeding, hematoma, infection, fluid accumulation, wound breakdown, skin loss, numbness, breast deformity, need for more surgery, blood clots in arms/legs/lungs, nerve injury, blood vessel injury, death, etc. All questions were answered.  She will meet with the start clinic on 06/01/2022.  Baseline activity is that she can walk around the block without stopping and can walk up a flight of steps though she does get short of breath.  She did have a cardiac echo in  December.  This is a 1 hour total time visit.           Donzetta Kohut, MD, [place]  [phone]     CC: Ocie Doyne, NP

## 2022-06-01 ENCOUNTER — Ambulatory Visit
Admission: RE | Admit: 2022-06-01 | Discharge: 2022-06-01 | Disposition: A | Discharge status: Home / Self Care | Payer: 59 | Source: Ambulatory Visit | Attending: Surgery | Admitting: Surgery

## 2022-06-01 ENCOUNTER — Encounter (RURAL_HEALTH_CENTER): Payer: Self-pay | Admitting: Gerontology

## 2022-06-01 ENCOUNTER — Encounter (AMBULATORY_SURGERY_CENTER): Payer: Self-pay | Admitting: Surgery

## 2022-06-01 ENCOUNTER — Ambulatory Visit: Payer: 59 | Attending: Gerontology | Admitting: Gerontology

## 2022-06-01 ENCOUNTER — Ambulatory Visit: Payer: 59

## 2022-06-01 VITALS — BP 150/80 | HR 90 | Temp 97.9°F | Resp 16 | Ht 67.0 in | Wt 236.0 lb

## 2022-06-01 DIAGNOSIS — Z23 Encounter for immunization: Secondary | ICD-10-CM

## 2022-06-01 DIAGNOSIS — D241 Benign neoplasm of right breast: Secondary | ICD-10-CM

## 2022-06-01 LAB — BASIC METABOLIC PANEL
Anion Gap: 15.2 mMol/L (ref 7.0–18.0)
BUN / Creatinine Ratio: 2.9 Ratio — ABNORMAL LOW (ref 10.0–30.0)
BUN: 13 mg/dL (ref 7–22)
CO2: 34 mMol/L — ABNORMAL HIGH (ref 20–30)
Calcium: 10.2 mg/dL (ref 8.5–10.5)
Chloride: 97 mMol/L — ABNORMAL LOW (ref 98–110)
Creatinine: 4.52 mg/dL — ABNORMAL HIGH (ref 0.60–1.20)
EGFR: 11 mL/min/{1.73_m2} — ABNORMAL LOW (ref 60–150)
Glucose: 105 mg/dL — ABNORMAL HIGH (ref 71–99)
Osmolality Calculated: 282 mOsm/kg (ref 275–300)
Potassium: 5.2 mMol/L (ref 3.5–5.3)
Sodium: 141 mMol/L (ref 136–147)

## 2022-06-01 LAB — CBC AND DIFFERENTIAL
Basophils %: 0.5 % (ref 0.0–3.0)
Basophils Absolute: 0 10*3/uL (ref 0.0–0.3)
Eosinophils %: 0.8 % (ref 0.0–7.0)
Eosinophils Absolute: 0 10*3/uL (ref 0.0–0.8)
Hematocrit: 35.6 % — ABNORMAL LOW (ref 36.0–48.0)
Hemoglobin: 10.8 gm/dL — ABNORMAL LOW (ref 12.0–16.0)
Lymphocytes Absolute: 1.7 10*3/uL (ref 0.6–5.1)
Lymphocytes: 34.9 % (ref 15.0–46.0)
MCH: 32 pg (ref 28–35)
MCHC: 30 gm/dL — ABNORMAL LOW (ref 32–36)
MCV: 105 fL — ABNORMAL HIGH (ref 80–100)
MPV: 8.5 fL (ref 6.0–10.0)
Monocytes Absolute: 0.3 10*3/uL (ref 0.1–1.7)
Monocytes: 6.9 % (ref 3.0–15.0)
Neutrophils %: 56.8 % (ref 42.0–78.0)
Neutrophils Absolute: 2.8 10*3/uL (ref 1.7–8.6)
PLT CT: 227 10*3/uL (ref 130–440)
RBC: 3.39 10*6/uL — ABNORMAL LOW (ref 3.80–5.00)
RDW: 14.5 % — ABNORMAL HIGH (ref 11.0–14.0)
WBC: 4.9 10*3/uL (ref 4.0–11.0)

## 2022-06-01 NOTE — Progress Notes (Signed)
Patient received Shingrix Vaccine today. Injection given IM right Deltoid. See Immunizations tab for more details. Written or Verbal consent obtained. Patient is afebrile. Patient tolerated injection and vaccine well. No immediate reaction noted at this time. VIS given and/or offered to patient. Advised patient to call office with any questions and/or concerns. Patient voices understanding.[place]

## 2022-06-01 NOTE — Pre-Procedure Instructions (Signed)
Pt denies stress test or PFT's in last 5 years.  Pt denies acute cardiopulmonary problems. Echo done 09/14/2021 - in epic CE    Per new surgical protocol, no COVID test required for surgery. Pt instructed to call surgeon's office if they develop any COVID symptoms within 10 days of surgery for further instructions.  Verbalized understanding.     Pt instructed to hold Mounjaro 1 week before surgery.  Last dose will be 06/10/2022.

## 2022-06-01 NOTE — Progress Notes (Signed)
Subjective:     Patient ID: Kelli Carlson is a 56 y.o. female.  Chief Complaint   Patient presents with    Follow-up     Shingles Booster       HPI    The following portions of the patient's history were reviewed and updated as appropriate: allergies, current medications, past family history, past medical history, past social history, past surgical history and problem list.    Social History     Socioeconomic History    Marital status: Married   Tobacco Use    Smoking status: Never    Smokeless tobacco: Never   Vaping Use    Vaping Use: Never used   Substance and Sexual Activity    Alcohol use: No     Alcohol/week: 0.0 standard drinks of alcohol    Drug use: No    Sexual activity: Yes     Review of Systems                    Objective:    Physical Exam  Blood pressure 150/80, pulse 90, temperature 97.9 F (36.6 C), temperature source Temporal, resp. rate 16, height 1.702 m (5\' 7" ), weight 107 kg (236 lb), last menstrual period 03/30/2016.        Medical Decision Making:   1. Need for shingles vaccine  - Zoster Vaccine Recomb,Adjuvanted (IM)      No follow-ups on file.      Follow up sooner if no improvement or if symptoms worsen or persist.   Medication dosing instructions, drug-drug interactions and potential side effects discussed in detail.   Failure to comply with plan of care discussed with patient. Importance of adhering to follow up office visits discussed in detail.   Potential risks of noncompliance discussed with patient.    Ocie Doyne, NP

## 2022-06-01 NOTE — Pre-Procedure Instructions (Addendum)
Message to Dr Georgeanna Lea to review EKG, echo and pt history.  Reviewed and states okay to proceed.  States pt will need potassium level the day of surgery.  Notified Dr Champ Mungo that this will need ordered since pt is having surgery at the [facility].  She agreed to follow up.  Noted on OR schedule as well.

## 2022-06-06 ENCOUNTER — Ambulatory Visit (INDEPENDENT_AMBULATORY_CARE_PROVIDER_SITE_OTHER): Payer: 59 | Admitting: Surgery

## 2022-06-12 ENCOUNTER — Other Ambulatory Visit (INDEPENDENT_AMBULATORY_CARE_PROVIDER_SITE_OTHER): Payer: Self-pay | Admitting: Surgery

## 2022-06-12 DIAGNOSIS — D241 Benign neoplasm of right breast: Secondary | ICD-10-CM

## 2022-06-14 ENCOUNTER — Telehealth (RURAL_HEALTH_CENTER): Payer: Self-pay | Admitting: Gerontology

## 2022-06-14 MED ORDER — AMOXICILLIN 500 MG PO CAPS
500.0000 mg | ORAL_CAPSULE | Freq: Three times a day (TID) | ORAL | 0 refills | Status: AC
Start: 2022-06-14 — End: 2022-06-24

## 2022-06-14 NOTE — Telephone Encounter (Signed)
Patient called stating her annual allergies have turned into a sinus infection, and she is requesting amoxicillin to be sent to [facility] on [address] in [place].

## 2022-06-14 NOTE — Telephone Encounter (Signed)
ok 

## 2022-06-16 NOTE — Discharge Instr - AVS First Page (Signed)
DISCHARGE INSTRUCTIONS--[facility]  Partial Mastectomy/Excisional Biopsy    ACTIVITY:  Maintain your normal wake and sleep schedule, but rest midday to avoid getting overtired.  Do not lift anything over 5 pounds or do anything repetitive with your affected arm.  Do 3 sets of exercises daily (10 reps each of the "arm to ear" and "the chicken").  Stay active to lower the chance of blood clots in the leg veins.    BANDAGES/BATHING:  Take a quick shower the evening of the second day after surgery.  Get in the shower with the bandages on and then remove the tape and gauze.  If you have steristrips, do not remove them.  The moisture helps loosen the tape.  You may use normal soap and shampoo.  For subsequent showers, just let the soap run over the incision- no need to scrub.  If you have a drain, place it in the [place] mesh drain pouch.  After removing the bandages, leave them off and let the incision open to air.  You may place a bandaid over the drain exit site, if desired.  You may use your usual deodorant.    BRA: Wear your bra night and day for two weeks, except when showering. This is very important! Apply ice intermittently on the day of surgery.    CONSTIPATION:  Narcotic pain medicine, anesthesia, and disrupted routines can lead to constipation.  Take stool softeners preemptively and use laxatives if needed.  We recommend milk of magnesia (as long as you don't have kidney disease).    DRAINS:  Make sure the drain bulb is compressed at all times to create a gentle suction force on your wound.  Record the drain output 2 -3 times a day and keep a separate sheet for each drain.  It is typical for the skin at the drain exit site to become pink and inflamed, especially if the drain remains in for a while.  If the drain is not working or is leaking around the skin exit site, pinch it with your finger pads at the area closest to your skin and then strip it.  This process usually will unclog the  tubing.  Stop your antibiotics when the last drain is removed.    DRIVING:  You should not drive until you are off of pain medicine and feel comfortable with using your arm, in case of a quick maneuver.    FOLLOW-UP:  You have an appointment to see Dr. Champ Mungo about 2 weeks after surgery.  You will be given a copy of your pathology report at that visit.    INCISION:  Blot your incision dry after showers and do not apply any lotions, creams, etc.  Bruising and mild swelling are common.  Your steri-strips or staples will be removed by Dr. Champ Mungo.  Occasionally a suture knot will be visible.  Leave it alone and let Dr. Champ Mungo snip it at your follow up visit.    PAIN MEDICINE:  Use over the counter tylenol as needed for pain, not to exceed 4000 mg per day.    You had Tylenol (acetaminophen) while you were at the [facility]. Tylenol is in many different medications, including prescription pain medications, so please be careful not to exceed the recommended amount.  Please be aware you had Tylenol at 9:40 am, it should not be taken again for six hours.    RASH:  The surgical antiseptic soap used in the OR may cause a rash on your breast,  trunk and upper arm.  You may use OTC steroid cream and/or take an antihistamine pill (eg, Benadryl).    WORRIES:  Call for diarrhea, fever, bright redness or severe/sudden swelling of the incision.  The best emergency contact number is [phone].  The hospital operator will page Dr. Champ Mungo.      GENERAL POST ANESTHESIA INSTRUCTIONS    POST ANESTHESIA:  Although you (or your child) will be awake and alert in the Recovery Room, small amounts of sedation will remain in the body for at least 24 hours, Feeling tired and sleepy is normal for the next 24 hours. You are advised to go directly home, take it easy and rest as much as possible.  It is advisable to have someone with you at home for the remainder of the day.  Children should not be left unattended for the next 24 hrs.  Provide quiet activity (TV, board games, coloring)     FOR THE NEXT 24 HOURS:  DO NOT operate a motor vehicle or any mechanical or electrical equipment. Children should not use riding toys, bikes, skateboards, etc.   DO NOT make important decisions or sign legal documents.  DO NOT consume alcohol, tranquilizers, sleeping medications, or any non-prescribed medication.    DIET:  Begin with liquids then progress to your normal diet if not nauseated.  Nausea and vomiting may occur in the first 24 hours. Drink a lot of fluids, such as water, for the next 24 hours.    IF  YOU HAVE ANY OF THE FOLLOWING, PLEASE CONTACT YOUR PHYSICIAN:         1. Pain not controlled by the medication prescribed for you.         2. Unexplained fever (temperature 101F or greater).         3. Bleeding greater than spots from affected area.         4. Increased redness, warmth, hardness around the operative area.         5. Other unexplained symptoms, problems or concerns.         6. If you experience redness or swelling at the IV insertion site that continues or worsen over 2-3 days, please have it evaluated by your doctor.      We would like to wish you a quick and comfortable recovery from your procedure. For any questions or concerns about the care you received, please contact us at the appropriate number below Monday-Friday from 8:30-5:00pm. Please direct any medical questions to your surgeon by calling their office or the switchboard at [phone].              [facility] [phone]       IF YOU NEED IMMEDIATE ATTENTION AND YOUR SURGEON IS NOT AVAILABLE, PLEASE GO TO YOUR NEAREST EMERGENCY DEPARTMENT.       **General Reference and Education for Surgical Patients**    How to control pain  Your surgeon may or may not prescribe a pain medication for you after your surgery.  If so, please be sure to take them only as directed and be cautious about any possible side effects.      Opioid pain medications  are often prescribed for pain. However, they can cause nausea, vomiting, itching, drowsiness, and/or constipation. Stomach upset can be lessened if the drug is taken with food. You should not drive or operate machinery while taking these medications.  Opioid medications often contain acetaminophen (Tylenol). Make sure that other medications  that you are taking do not contain acetaminophen. Too much acetaminophen can damage your liver.  Your surgeon may instruct you to take over the counter pain medications to manage your pain. There are two main groups of these:  Non-Opioid (Non-narcotic) medications (Tylenol and others) can be effective for mild to moderate pain. They have very few side effects and are safe for most patients.   Nonsteroidal Anti-inflammatory Drugs (NSAIDS) ibuprofen (Advil), naproxen sodium (Aleve), celecoxib (Celebrex), and others work to reduce swelling and inflammation and relieve mild to moderate pain Cautions:  Some surgeons do not want their patients to take this type of medication after surgery due to the side effects.  Please be sure to ask your surgeon if it is advised.  You should not take these drugs without your doctor's approval or if you have kidney problems, a history of stomach ulcers, heart failure or are on "blood thinner" medications such as Coumadin (warfarin), Lovenox injections, or Plavix.  Be sure to tell your doctor about all medications (prescribed and over-the-counter), vitamins, and herbal supplements you are taking. This may affect which drugs are prescribed for your pain control.    Other ways to relieve pain    Positioning or elevation can often decrease pain as well.  Try raising and supporting the affected arm or leg above the level of your heart, changing the position of your body,  sitting in a more upright position, or supporting the affected area with pillows to see if these actions reduce your pain.  Focused relaxation that helps create calm, peaceful  images in your mind -- a "mental escape" is another technique used by people who experience pain.      What to do if you have nausea or vomiting.  While we do everything we can to prevent nausea and vomiting after surgery sometimes it still occurs.  There are steps you can take to decrease or prevent nausea and vomiting.  Staying hydrated is important so try to take clear, room temperature liquids in small amounts. Avoid acidic fruit juices and milk-based products immediately after surgery because these can increase gastric secretions. Anesthesia slows down your digestive system, so we advise you not to drink excessive amounts of carbonated beverages, such as soft drinks, which can distend the stomach.  Keep your first meal bland (such as bananas, applesauce, rice, or toast) and keep the amounts small.  If you are taking pain medications, they could also be the cause.  Decreasing or discontinuing them may solve the problem.  If you have persistent nausea and vomiting not relieved by these actions, please call your surgeon.    Preventing Infection  There are many things YOU can do to prevent your surgical site from becoming infected.    Wash your hands with soap and water before and after changing your bandages or touching your incision.  Keep your bandages dry and follow your surgeon's instructions on dressing changes and removal.  Wear loose fitting clothing to protect your incisions from rubbing and irritation.  Shower daily as soon as you are able, cleaning around the site with soap and water.  Pat the area dry with a clean towel or use a hair dryer on no or low heat.  Be sure you are cleaning and caring for your incision at least once a day.  No bath tubs, swimming pools, or hot tubs for two weeks unless otherwise directed by your surgeon.  Do not apply ointments, lotions, or powder to the area unless specifically  instructed to by your surgeon.  No not let dirty objects or animals touch your incision.  Leave any  clear adhesive glue (dermabond ) or little pieces of tape (steri-strips) in place.  Other actions that can help prevent infection:  Eat a well-balanced nutritious diet  Stop or decrease smoking during the healing period (or better yet, permanently).  Smoking increases healing time and increases the risk of infection.  If you are a diabetic, keep your blood sugars well-regulated.    Signs of infection  Feelings of Malaise- Feeling tired and unwell with little or no improvement as your recovery progresses can be a sign of infection.  Running a Fever- Running a low grade fever is not unusual after surgery, but temperatures over 101 could mean you have an infection.   Fluid Drainage- Some drainage is normal but fluid that is cloudy, green, or foul smelling could be a sign of infection.  Continual or Increased Pain- Pain should get better as your body heals. However, continual or increased pain may be a sign of infection.  Redness and Swelling- Some redness is normal, however,  incisions that look progressively red, have red streaks, or become progressively puffy and swollen, and/or feel warm to the touch,  may be developing an infection.    What to do if you have bleeding  Follow your surgeon's instructions regarding the care of your incision and bandages.  Some spotting of blood or drainage is normal and will vary depending upon the type of surgery you had.  However, if the bleeding is excessive, you can treat an actively bleeding incision with clean gauze and direct pressure.  If possible, elevate the affected area.  You should then call your doctor or go to the emergency room depending on the severity of the bleeding.    Deep Vein Thrombosis (DVT) Precautions    A DVT occurs when a blood clot (thrombus) forms in a deep vein. This happens most often in the leg. A part of the clot called an embolus can break off and travel to the lungs. This is called pulmonary embolism (PE). PE is a medical emergency. It can cut off  the flow of blood and cause death.   The symptoms of a blood clot are redness, swelling, and pain. These symptoms occur at the site of the DVT, such as in the leg.  Factors that increase risk of DVT include being overweight, smoking, and using female hormones (estrogen replacement therapy). Long periods of time without movement, such as when traveling long distances by car, plane, or when being confined to bed due to surgery, illness, or injury also increases the risk of blood clots.  Home care  Follow your health care provider's instructions about activity and rest.  Support or compression stockings may have been prescribed. They help improve blood flow in the legs. Be sure to wear them as directed.  When sitting or lying down move your ankles, toes, and knees often. This also improves blood flow in the legs.  You may have been prescribed anticoagulants or "blood thinners." They may be given as pills (oral) or shots (injections). Make sure you follow all instructions on how to take them.  Follow-up care   Follow up with your health care provider as advised.  When to seek medical advice   Call your health care provider if you have swelling, pain, or redness in the leg, arm, or other area. These symptoms may mean a blood clot.    When to  Call 911  Call 911 or get emergency help if you have the following symptoms that may mean a blood clot in your lungs:  Trouble breathing  Chest pain  Coughing (may cough up blood)  Fast heartbeat  Sweating  Fainting

## 2022-06-20 ENCOUNTER — Ambulatory Visit (AMBULATORY_SURGERY_CENTER): Payer: 59 | Admitting: Anesthesiology

## 2022-06-20 ENCOUNTER — Ambulatory Visit
Admission: RE | Admit: 2022-06-20 | Discharge: 2022-06-20 | Disposition: A | Discharge status: Home / Self Care | Payer: 59 | Source: Ambulatory Visit | Attending: Surgery | Admitting: Surgery

## 2022-06-20 ENCOUNTER — Encounter (AMBULATORY_SURGERY_CENTER): Payer: Self-pay | Admitting: Surgery

## 2022-06-20 ENCOUNTER — Encounter (AMBULATORY_SURGERY_CENTER)
Admission: RE | Disposition: A | Discharge status: Home / Self Care | Payer: Self-pay | Source: Ambulatory Visit | Attending: Surgery

## 2022-06-20 DIAGNOSIS — D241 Benign neoplasm of right breast: Secondary | ICD-10-CM | POA: Insufficient documentation

## 2022-06-20 DIAGNOSIS — E1121 Type 2 diabetes mellitus with diabetic nephropathy: Secondary | ICD-10-CM

## 2022-06-20 DIAGNOSIS — Z803 Family history of malignant neoplasm of breast: Secondary | ICD-10-CM

## 2022-06-20 DIAGNOSIS — Z01818 Encounter for other preprocedural examination: Secondary | ICD-10-CM

## 2022-06-20 DIAGNOSIS — N6011 Diffuse cystic mastopathy of right breast: Secondary | ICD-10-CM | POA: Insufficient documentation

## 2022-06-20 DIAGNOSIS — Z7985 Long-term (current) use of injectable non-insulin antidiabetic drugs: Secondary | ICD-10-CM

## 2022-06-20 HISTORY — DX: Nonrheumatic aortic (valve) stenosis: I35.0

## 2022-06-20 HISTORY — DX: Type 2 diabetes mellitus without complications: E11.9

## 2022-06-20 HISTORY — DX: Malignant neoplasm of left kidney, except renal pelvis: C64.2

## 2022-06-20 HISTORY — PX: MASTECTOMY, PARTIAL W/ LOCALIZED WIRE: SHX510992

## 2022-06-20 HISTORY — DX: Essential (primary) hypertension: I10

## 2022-06-20 HISTORY — DX: Benign neoplasm, unspecified site: D36.9

## 2022-06-20 HISTORY — DX: End stage renal disease: N18.6

## 2022-06-20 LAB — I-STAT CHEM 8 CARTRIDGE
Anion Gap I-Stat: 15 (ref 7.0–16.0)
BUN I-Stat: 33 mg/dL — ABNORMAL HIGH (ref 7–22)
Calcium Ionized I-Stat: 4.7 mg/dL (ref 4.35–5.10)
Chloride I-Stat: 96 mMol/L — ABNORMAL LOW (ref 98–110)
Creatinine I-Stat: 5.7 mg/dL — ABNORMAL HIGH (ref 0.60–1.20)
EGFR: 8 mL/min/{1.73_m2} — ABNORMAL LOW (ref 60–150)
Glucose I-Stat: 135 mg/dL — ABNORMAL HIGH (ref 71–99)
Hematocrit I-Stat: 36 % (ref 36.0–48.0)
Hemoglobin I-Stat: 12.2 gm/dL (ref 12.0–16.0)
Potassium I-Stat: 4.2 mMol/L (ref 3.5–5.3)
Sodium I-Stat: 139 mMol/L (ref 136–147)
TCO2 I-Stat: 33 mMol/L — ABNORMAL HIGH (ref 24–29)

## 2022-06-20 LAB — ECG 12-LEAD
P Wave Axis: 7 deg
P-R Interval: 136 ms
Patient Age: 55 years
Q-T Interval(Corrected): 496 ms
Q-T Interval: 419 ms
QRS Axis: -17 deg
QRS Duration: 88 ms
T Axis: 37 years
Ventricular Rate: 84 //min

## 2022-06-20 LAB — VH DEXTROSE STICK GLUCOSE: Glucose POCT: 120 mg/dL — ABNORMAL HIGH (ref 71–99)

## 2022-06-20 SURGERY — MASTECTOMY, PARTIAL W/ LOCALIZED WIRE
Anesthesia: Anesthesia General | Site: Breast | Laterality: Right | Wound class: Clean

## 2022-06-20 MED ORDER — STERILE WATER FOR INJECTION IJ SOLN
2.0000 g | INTRAMUSCULAR | Status: AC
Start: 2022-06-20 — End: 2022-06-20
  Administered 2022-06-20: 2 g via INTRAVENOUS
  Filled 2022-06-20: qty 2000

## 2022-06-20 MED ORDER — LIDOCAINE HCL (PF) 2 % IJ SOLN
INTRAMUSCULAR | Status: AC
Start: 2022-06-20 — End: 2022-06-20
  Filled 2022-06-20: qty 20

## 2022-06-20 MED ORDER — FENTANYL CITRATE (PF) 50 MCG/ML IJ SOLN (WRAP)
INTRAMUSCULAR | Status: AC
Start: 2022-06-20 — End: ?
  Filled 2022-06-20: qty 2

## 2022-06-20 MED ORDER — FENTANYL CITRATE (PF) 50 MCG/ML IJ SOLN (WRAP)
INTRAMUSCULAR | Status: DC | PRN
Start: 2022-06-20 — End: 2022-06-20
  Administered 2022-06-20: 50 ug via INTRAVENOUS

## 2022-06-20 MED ORDER — MIDAZOLAM HCL 1 MG/ML IJ SOLN (WRAP)
INTRAMUSCULAR | Status: AC
Start: 2022-06-20 — End: ?
  Filled 2022-06-20: qty 2

## 2022-06-20 MED ORDER — PROPOFOL 200 MG/20ML IV EMUL
INTRAVENOUS | Status: AC
Start: 2022-06-20 — End: ?
  Filled 2022-06-20: qty 20

## 2022-06-20 MED ORDER — FENTANYL CITRATE (PF) 50 MCG/ML IJ SOLN (WRAP)
25.0000 ug | INTRAMUSCULAR | Status: DC | PRN
Start: 2022-06-20 — End: 2022-06-20
  Administered 2022-06-20 (×2): 25 ug via INTRAVENOUS
  Filled 2022-06-20: qty 2

## 2022-06-20 MED ORDER — VH HYDROMORPHONE HCL PF 1 MG/ML CARPUJECT
0.5000 mg | INTRAMUSCULAR | Status: DC | PRN
Start: 2022-06-20 — End: 2022-06-20

## 2022-06-20 MED ORDER — DROPERIDOL 2.5 MG/ML IJ SOLN
0.6250 mg | Freq: Once | INTRAMUSCULAR | Status: DC | PRN
Start: 2022-06-20 — End: 2022-06-20

## 2022-06-20 MED ORDER — DEXAMETHASONE SODIUM PHOSPHATE 4 MG/ML IJ SOLN
INTRAMUSCULAR | Status: AC
Start: 2022-06-20 — End: ?
  Filled 2022-06-20: qty 1

## 2022-06-20 MED ORDER — ACETAMINOPHEN 500 MG PO TABS
1000.0000 mg | ORAL_TABLET | ORAL | Status: DC
Start: 2022-06-20 — End: 2022-06-20

## 2022-06-20 MED ORDER — EPHEDRINE SULFATE 50 MG/ML IJ/IV SOLN (WRAP)
Status: DC | PRN
Start: 2022-06-20 — End: 2022-06-20
  Administered 2022-06-20: 10 mg via INTRAVENOUS

## 2022-06-20 MED ORDER — METOCLOPRAMIDE HCL 5 MG/ML IJ SOLN
10.0000 mg | Freq: Once | INTRAMUSCULAR | Status: DC | PRN
Start: 2022-06-20 — End: 2022-06-20

## 2022-06-20 MED ORDER — SOD CITRATE-CITRIC ACID 500-334 MG/5ML PO SOLN
30.0000 mL | Freq: Once | ORAL | Status: AC | PRN
Start: 2022-06-20 — End: 2022-06-20
  Administered 2022-06-20: 30 mL via ORAL
  Filled 2022-06-20: qty 30

## 2022-06-20 MED ORDER — MIDAZOLAM HCL 1 MG/ML IJ SOLN (WRAP)
INTRAMUSCULAR | Status: DC | PRN
Start: 2022-06-20 — End: 2022-06-20
  Administered 2022-06-20 (×2): 1 mg via INTRAVENOUS

## 2022-06-20 MED ORDER — DIPHENHYDRAMINE HCL 50 MG/ML IJ SOLN
25.0000 mg | Freq: Four times a day (QID) | INTRAMUSCULAR | Status: DC | PRN
Start: 2022-06-20 — End: 2022-06-20

## 2022-06-20 MED ORDER — MEPERIDINE HCL 25 MG/ML IJ SOLN
12.5000 mg | Freq: Once | INTRAMUSCULAR | Status: DC | PRN
Start: 2022-06-20 — End: 2022-06-20

## 2022-06-20 MED ORDER — VH HYDROMORPHONE HCL PF 1 MG/ML CARPUJECT
0.2500 mg | INTRAMUSCULAR | Status: DC | PRN
Start: 2022-06-20 — End: 2022-06-20

## 2022-06-20 MED ORDER — INSULIN LISPRO (1 UNIT DIAL) 100 UNIT/ML SC SOPN
2.0000 [IU] | PEN_INJECTOR | Freq: Once | SUBCUTANEOUS | Status: DC | PRN
Start: 2022-06-20 — End: 2022-06-20

## 2022-06-20 MED ORDER — ACETAMINOPHEN 160 MG/5ML PO SOLN
1000.0000 mg | ORAL | Status: DC
Start: 2022-06-20 — End: 2022-06-20

## 2022-06-20 MED ORDER — OXYCODONE HCL 5 MG PO TABS
5.0000 mg | ORAL_TABLET | Freq: Once | ORAL | Status: DC | PRN
Start: 2022-06-20 — End: 2022-06-20

## 2022-06-20 MED ORDER — PROCHLORPERAZINE EDISYLATE 10 MG/2ML IJ SOLN
5.0000 mg | Freq: Once | INTRAMUSCULAR | Status: DC | PRN
Start: 2022-06-20 — End: 2022-06-20

## 2022-06-20 MED ORDER — ONDANSETRON HCL 4 MG/2ML IJ SOLN
INTRAMUSCULAR | Status: DC | PRN
Start: 2022-06-20 — End: 2022-06-20
  Administered 2022-06-20: 4 mg via INTRAVENOUS

## 2022-06-20 MED ORDER — DEXAMETHASONE SODIUM PHOSPHATE 4 MG/ML IJ SOLN
INTRAMUSCULAR | Status: DC | PRN
Start: 2022-06-20 — End: 2022-06-20
  Administered 2022-06-20: 4 mg via INTRAVENOUS

## 2022-06-20 MED ORDER — PROPOFOL 200 MG/20ML IV EMUL
INTRAVENOUS | Status: DC | PRN
Start: 2022-06-20 — End: 2022-06-20
  Administered 2022-06-20: 150 mg via INTRAVENOUS

## 2022-06-20 MED ORDER — ONDANSETRON HCL 4 MG/2ML IJ SOLN
INTRAMUSCULAR | Status: AC
Start: 2022-06-20 — End: ?
  Filled 2022-06-20: qty 2

## 2022-06-20 MED ORDER — LACTATED RINGERS IV SOLN
INTRAVENOUS | Status: DC
Start: 2022-06-20 — End: 2022-06-20

## 2022-06-20 MED ORDER — BUPIVACAINE HCL (PF) 0.5 % IJ SOLN
INTRAMUSCULAR | Status: AC | PRN
Start: 2022-06-20 — End: 2022-06-20
  Administered 2022-06-20: 17 mL

## 2022-06-20 MED ORDER — FENTANYL CITRATE (PF) 50 MCG/ML IJ SOLN (WRAP)
50.0000 ug | INTRAMUSCULAR | Status: DC | PRN
Start: 2022-06-20 — End: 2022-06-20

## 2022-06-20 MED ORDER — LIDOCAINE HCL (PF) 2 % IJ SOLN
INTRAMUSCULAR | Status: DC | PRN
Start: 2022-06-20 — End: 2022-06-20
  Administered 2022-06-20: 50 mg via INTRAVENOUS

## 2022-06-20 MED ORDER — ACETAMINOPHEN 10 MG/ML IV SOLN
1000.0000 mg | Freq: Once | INTRAVENOUS | Status: AC
Start: 2022-06-20 — End: 2022-06-20
  Administered 2022-06-20: 1000 mg via INTRAVENOUS

## 2022-06-20 SURGICAL SUPPLY — 21 items
BLADE SCALPEL #10 (Supply) ×1 IMPLANT
CHLORAPREP 26ML CLEAR (Supply) ×1 IMPLANT
ELECTRODE BLADE INSUL 2.75 (Supply) ×1 IMPLANT
GLOVE ULTRATOUCH G PI SURG 7.0 (Supply) ×1 IMPLANT
GOWN AURORA NONREINF STER XLG (Supply) ×1 IMPLANT
HEMOCLIP MEDIUM 24 (Supply) IMPLANT
NDL HYPO 25GA X 1.5 (Supply) ×1 IMPLANT
PACK SURGI-LAPAROTOMY (Supply) ×1 IMPLANT
PAD GROUNDING REM  (ELECTRODE) (Supply) ×1 IMPLANT
SOL SALINE IRRIG 500ML (Supply) ×1 IMPLANT
SPONGE LAP 18 X 18 (Supply) ×1 IMPLANT
STAPLER PROX PLUS WIDE SKIN (Supply) IMPLANT
STERISTRIP 1/2 IN X 4 IN (Supply) ×1 IMPLANT
SUT MONOCRYL 4-0 Y496G (Supply) ×1 IMPLANT
SUT VICRYL 3-0 J110T (Supply) IMPLANT
SUT VICRYL 3-0 J416H (Supply) ×1 IMPLANT
SUT VICRYL 3-0 J774D (Supply) IMPLANT
TAPE MEDIPORE 4"X10YDS CLOTH (Supply) ×1 IMPLANT
TOWEL BLUE STERILE 6 PER PK (Supply) ×1 IMPLANT
TUBE YANKAUER WITHOUT VENT (Supply) ×1 IMPLANT
TUBING SUCTION 10FT STERILE (Supply) ×1 IMPLANT

## 2022-06-20 NOTE — PACU (Signed)
Patient states, "Ready to get up to chair." Denies nausea, pain tolerable. VSS Patient dangled at bedside, tolerated well.  Assisted with dressing at bedside by Cinda Quest, RN. Tolerates activity well.

## 2022-06-20 NOTE — Op Note (Signed)
FULL OPERATIVE NOTE    Date Time: 06/20/22 10:41 AM  Patient Name: [facility]  Attending Physician: Antony Haste, MD      Date of Operation:   06/20/2022    Providers Performing:   Surgeon(s):  Stefania Goulart, Cherlyn Labella, MD    Anesthesiologist: Redmond School, MD  CRNA: Chapman Moss, CRNA    Circulator: Velta Addison, Elnora Morrison, RN  Relief Circulator: Charlann Noss, RN  Scrub Person: Barnett Abu    Anesthesia/Sedation:   general    Operative Procedure:   Procedure(s):  MASTECTOMY, PARTIAL W/ LOCALIZED WIRE - Wound Class: Clean     Preoperative Diagnosis:   Pre-Op Diagnosis Codes:     * 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 female who is status post breast biopsy showing intraductal papilloma with atypia.  She is now scheduled for wide excision to better define pathology.      Operative Notes:   Informed consent was obtained from the patient. She went to the diagnostic center where the area of prior biopsy was wire localized by the radiologist using image guidance. The wire was taped securely to her breast and she was taken to the operating room.  She was placed in the supine position with all pressure points protected.  Pneumatic compression stockings were placed.   General anesthetic was given.  Preoperative IV antibiotics were given. The right breast, chest and axilla were prepped and draped in the usual sterile fashion.    An incision was anesthetized and sharply created over the area of concern in the breast.  The electrocautery was used to remove the tissue along the course of the localizing wire. This was labeled for orientation.  Specimen radiograph, in multiple planes, was obtained to confirm adequacy of removal. The tissue was sent for permanent  pathologic evaluation. The wound was irrigated and suctioned clear.  Hemostasis was achieved. Additional local anesthetic was infiltrated. The wound was then closed in layers using running subdermal vicryl followed by running subcuticular monocryl.  The surgical cavity was marked with clips.  The incision was then dressed with steri-strips and sterile dressing. The patient tolerated the procedure well. There were no complications. She was taken to the recovery room in stable condition.        Estimated Blood Loss:    10 mL    Implants:   * No implants in log *    Drains:   Drains: no    Specimens:     ID Type Source Tests Collected by Time Destination   A : A. portion right breast, short stitch superior, long stitch lateral Mastectomy, partial Breast (specify laterality) [facility] SURGICAL PATHOLOGY Antavion Bartoszek, Cherlyn Labella, MD 06/20/2022 2952          Complications:   * No complications entered in OR log *      Signed by: Antony Haste, MD, MD

## 2022-06-20 NOTE — PACU (Signed)
Patient states, "Ready to go home." Denies nausea, pain tolerable, VSS,  Tolerates PO fluids and activity.  Denies questions related to discharge instructions.

## 2022-06-20 NOTE — Interval H&P Note (Signed)
History reviewed, patient examined, no change H&P.

## 2022-06-20 NOTE — Transfer of Care (Signed)
Anesthesia Transfer of Care Note  To PACU, HOB and side rails up. VSS, Report to RN  Patient: Kelli Carlson    Last vitals:   Vitals:    06/20/22 1042   BP: 137/72   Pulse: 89   Resp: 16   Temp: 36.5 C (97.7 F)   SpO2: 100%       Oxygen: Mask     Mental Status:sedated    Airway: Natural    Cardiovascular Status:  stable

## 2022-06-20 NOTE — Discharge Instructions (Signed)
Obstructive Sleep Apnea  Obstructive sleep apnea is a condition caused by air passages becoming narrowed or blocked during sleep. As a result, breathing stops for short periods. Your body wakes up enough for breathing to start again. But you don't remember it. The cycle of stopped breathing and brief awakenings can repeat dozens of times a night. This prevents the body from getting to the deeper stages of sleep that are needed for good rest.   Signs of sleep apnea include loud snoring, noisy breathing, and gasping sounds during sleep. People with sleep apnea often find they use the bathroom many times during the night. Daytime symptoms include waking up tired after a full night's sleep and waking up with headaches. They can also include feeling very sleepy or falling asleep during the day, and having problems with memory or concentration.   Risk factors for sleep apnea include:  Being overweight  Being assigned female at birth, or being in menopause  Smoking  Using alcohol or sedating medicines  Having enlarged structures in the nose or throat such as enlarged tonsils or adenoids, or extra tissue in the airway  Home care  Lifestyle changes that can help treat snoring and sleep apnea include:   If you're overweight, talk with your healthcare provider about a weight-loss plan for you.  Don't drink alcohol for 3 to 4 hours before bedtime.  Don't take sedating medicines. Ask your healthcare provider about the medicines you take.  If you smoke, talk to your provider about ways to quit. It's important to stay away from secondhand smoke. Don't use e-cigarettes because of their harmful side effects.  Sleep on your side. This can help prevent gravity from pulling relaxed throat tissues into your breathing passages.  If you have allergies or sinus problems that block your nose, ask your provider for help.  Use positive airway pressure (PAP). Discuss with your provider the benefits of using PAP at home. And talk about the type  of PAP that's best for you.  Follow-up care  Follow up with your healthcare provider, or as advised. A diagnosis of sleep apnea is made with a sleep study. Your provider can tell you more about this test.   When to get medical care  See your healthcare provider if you have daytime symptoms of sleep apnea. These include:   Waking up tired after a full night's sleep  Waking up with a headache  Feeling very sleepy or falling asleep during the day  Having problems with memory or concentration  Also talk with your provider if your partner tells you that you snore, gasp for air, or stop breathing while you sleep.   Seeing your provider is important because sleep apnea can make you more likely to have certain health problems. These include high blood pressure, heart attack, stroke, and sexual dysfunction. If you have sleep apnea, talk with your healthcare provider about the best treatments for you.   [employer] last reviewed this educational content on 01/23/2021   2000-2023 [employer]. All rights reserved. This information is not intended as a substitute for professional medical care. Always follow your healthcare professional's instructions.

## 2022-06-20 NOTE — Progress Notes (Signed)
Patient woke up and reported severe mid sternal chest heaviness. Anesthesia called and EKG performed.  No other complaints. VSS. Continuing to monitor.

## 2022-06-20 NOTE — PACU (Signed)
Patiient's EKG result is similar to previous EKG according to Dr. Merrilee Jansky. Patient tolerated p.o. fluids, rated heaviness on chest 3/10, denies nausea, VSS. Patient resting at this time.

## 2022-06-20 NOTE — Anesthesia Preprocedure Evaluation (Addendum)
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, MWF k4  Renal cell ca  DM2  HTN  Mild AS    Lab             06/01/22                       0924          HGB          10.8*         HCT          35.6*         PLT          227           CREAT        4.52*         K            5.2         ]    NSR                                    Relevant Problems   NEURO/PSYCH   (+) Headache, unspecified      CARDIO   (+) Angina pectoris, unspecified   (+) Essential (primary) hypertension      GU/RENAL   (+) Anemia of chronic renal failure   (+) 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 Questions answered. Pt or guardian accepts.)                  informed consent obtained    ECG reviewed  pertinent labs reviewed               Signed by: Redmond School, MD 06/20/22 9:07 AM

## 2022-06-20 NOTE — Anesthesia Postprocedure Evaluation (Signed)
Anesthesia Post Evaluation    Patient: Kelli Carlson    Procedure(s):  MASTECTOMY, PARTIAL W/ LOCALIZED WIRE    Anesthesia type: general    Last Vitals:   Vitals Value Taken Time   BP 154/67 06/20/22 1115   Temp 36.5 C (97.7 F) 06/20/22 1042   Pulse 87 06/20/22 1115   Resp 15 06/20/22 1115   SpO2 100 % 06/20/22 1115                 Anesthesia Post Evaluation  Anesthesia Post Operative Evaluation      Patient Name:     [name]    Procedures Performed:     Procedure(s):  MASTECTOMY, PARTIAL W/ LOCALIZED WIRE    Anesthesia Type:      General    Patient Location:     [facility]    Last vitals:     Temp: Temp: 36.5 C (97.7 F)   HR: Heart Rate: 87   BP: BP: 154/67   RR: Resp Rate: 15   SpO2 SpO2: 100 %     Post Op Pain:      Patient not complaining of pain, continue current therapy     Mental Status:      Awake    Respiratory Function:      Tolerating RA    Cardiovascular:      Stable    Nausea/Vomiting:      Patient not complaining of nausea or vomiting    Hydration Status:      Adequate    Post Assessment:      no apparent anesthetic complications    Redmond School, MD  06/20/2022 12:34 PM     Signed by: Redmond School, MD, 06/20/2022 12:34 PM

## 2022-06-21 ENCOUNTER — Encounter (AMBULATORY_SURGERY_CENTER): Payer: Self-pay | Admitting: Surgery

## 2022-06-22 ENCOUNTER — Telehealth (INDEPENDENT_AMBULATORY_CARE_PROVIDER_SITE_OTHER): Payer: Self-pay | Admitting: Surgery

## 2022-06-22 NOTE — Telephone Encounter (Signed)
D/w pt path report from surgery

## 2022-07-03 ENCOUNTER — Ambulatory Visit (INDEPENDENT_AMBULATORY_CARE_PROVIDER_SITE_OTHER): Payer: 59 | Admitting: Surgery

## 2022-07-03 ENCOUNTER — Encounter (INDEPENDENT_AMBULATORY_CARE_PROVIDER_SITE_OTHER): Payer: Self-pay | Admitting: Surgery

## 2022-07-03 DIAGNOSIS — Z483 Aftercare following surgery for neoplasm: Secondary | ICD-10-CM

## 2022-07-03 DIAGNOSIS — D241 Benign neoplasm of right breast: Secondary | ICD-10-CM

## 2022-07-03 NOTE — Progress Notes (Signed)
[name],[name]  12-Dec-1965      Office note follow-up    HISTORY OF PRESENT ILLNESS: Kelli Carlson is a 56 y.o. female who is status post right partial mastectomy 06/20/2022 with pathology showing benign intraductal papilloma.  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    Postoperatively she is doing well.  Pain is none.  Arm range of motion is excellent.    Periareolar incision is intact.  Steri-Strips have been removed.  Exteriorized suture knots were snipped.    She was given a copy of her pathology report, showing benign papilloma.  There is no residual atypia.  There is no evidence of malignancy.    She will continue monthly breast self exams and will have annual clinical breast exams and mammograms with her PCP or GYN.    We did review potential benefit of chemoprevention, based on core biopsy showing atypia.  Based on her multiple medical comorbidities and medicines, we both agree not to pursue chemoprevention with antihormone therapy.    She will follow-up here as needed for breast concerns.    This is a no charge postop visit.       Donzetta Kohut, MD, [place]  [phone]     CC: Ocie Doyne, NP

## 2022-07-04 ENCOUNTER — Telehealth (INDEPENDENT_AMBULATORY_CARE_PROVIDER_SITE_OTHER): Payer: Self-pay | Admitting: Surgery

## 2022-07-04 NOTE — Telephone Encounter (Signed)
Patient called asking for a work note from day of surgery on 06/20/22 through Friday 07/28/22. Patient will be returning to work on Monday 07/31/22. Please fax work note to [employer] at [phone]. Patient also asked if we received Short Term Disability paperwork from [employer] that she said was faxed 06/27/22. I told her I did not see anything scanned in to her Chart from [place] but that I would check with our clinical team. Please advise.

## 2022-07-05 ENCOUNTER — Encounter (INDEPENDENT_AMBULATORY_CARE_PROVIDER_SITE_OTHER): Payer: Self-pay

## 2022-07-05 NOTE — Telephone Encounter (Signed)
Spoke to patient and advised we did receive her [employer] paperwork and it was completed and faxed back to them today @  [phone] (fax # on form). I also informed patient that Gay Filler is completing her work note for 9/26-11/3 and will fax to employer today.

## 2022-07-05 NOTE — Telephone Encounter (Signed)
Work note has been faxed to employer as requested.

## 2022-07-19 ENCOUNTER — Ambulatory Visit: Payer: 59 | Attending: Gerontology | Admitting: Gerontology

## 2022-07-19 ENCOUNTER — Encounter (RURAL_HEALTH_CENTER): Payer: Self-pay | Admitting: Gerontology

## 2022-07-19 ENCOUNTER — Ambulatory Visit (RURAL_HEALTH_CENTER): Payer: Self-pay | Admitting: Gerontology

## 2022-07-19 VITALS — BP 126/74 | HR 80 | Temp 97.7°F | Resp 16 | Ht 67.0 in | Wt 238.0 lb

## 2022-07-19 DIAGNOSIS — N6312 Unspecified lump in the right breast, upper inner quadrant: Secondary | ICD-10-CM

## 2022-07-19 NOTE — Progress Notes (Signed)
Subjective:     Patient ID: Kelli Carlson is a 56 y.o. female.  Chief Complaint   Patient presents with    Follow-up     1 month follow up since mastectomy - states she is doing well        HPI: Follow up Recovering well status post right partial mastectomy 06/20/2022   Pain is none.  Arm range of motion is excellent.     The following portions of the patient's history were reviewed and updated as appropriate: allergies, current medications, past family history, past medical history, past social history, past surgical history and problem list.    Social History     Socioeconomic History    Marital status: Married   Tobacco Use    Smoking status: Never    Smokeless tobacco: Never   Vaping Use    Vaping Use: Never used   Substance and Sexual Activity    Alcohol use: No     Alcohol/week: 0.0 standard drinks of alcohol    Drug use: No    Sexual activity: Yes     Review of Systems                    Objective:    Physical Exam  Vitals and nursing note reviewed.   Constitutional:       Appearance: Normal appearance.   Cardiovascular:      Rate and Rhythm: Normal rate.   Pulmonary:      Effort: Pulmonary effort is normal.   Musculoskeletal:         General: No swelling, tenderness or deformity.   Neurological:      Mental Status: She is alert. Mental status is at baseline.       Blood pressure 126/74, pulse 80, temperature 97.7 F (36.5 C), temperature source Temporal, resp. rate 16, height 1.702 m (5\' 7" ), weight 108 kg (238 lb).        Medical Decision Making:   1. Mass of upper inner quadrant of right breast    Recovering well status post right partial mastectomy 06/20/2022   Pain is none.  Arm range of motion is excellent.   No follow-ups on file.      Follow up sooner if no improvement or if symptoms worsen or persist.   Medication dosing instructions, drug-drug interactions and potential side effects discussed in detail.   Failure to comply with plan of care discussed with patient. Importance of adhering to  follow up office visits discussed in detail.   Potential risks of noncompliance discussed with patient.    Ocie Doyne, NP

## 2022-09-25 DIAGNOSIS — I350 Nonrheumatic aortic (valve) stenosis: Secondary | ICD-10-CM

## 2022-09-25 HISTORY — DX: Nonrheumatic aortic (valve) stenosis: I35.0

## 2022-10-26 ENCOUNTER — Ambulatory Visit (INDEPENDENT_AMBULATORY_CARE_PROVIDER_SITE_OTHER): Payer: Self-pay | Admitting: Urology

## 2022-11-02 ENCOUNTER — Other Ambulatory Visit (HOSPITAL_COMMUNITY): Payer: Self-pay

## 2022-11-02 MED ORDER — DIATRIZOATE MEGLUMINE-DIATRIZOATE SODIUM 66 %-10 % ORAL SOLUTION
ORAL | 0 refills | Status: DC
Start: 2022-11-02 — End: 2023-04-25

## 2022-11-05 MED ORDER — IOPAMIDOL 370 MG IODINE/ML (76 %) INTRAVENOUS SOLUTION
100.0000 mL | INTRAVENOUS | Status: AC
Start: 2022-11-05 — End: 2022-11-07
  Administered 2022-11-07: 82 mL via INTRAVENOUS

## 2022-11-06 ENCOUNTER — Telehealth (INDEPENDENT_AMBULATORY_CARE_PROVIDER_SITE_OTHER): Payer: Self-pay | Admitting: Urology

## 2022-11-06 NOTE — Telephone Encounter (Unsigned)
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 Patient is worried about the CT Scan that was ordered and if is needed. Patient stated that she was also to go to the hospital and go pick up contrast which is different than last time. Informed patient that we don't control the hospital and their protocols, we just order what we need done.     Patient would like a phone call from either nurse or Dr. Kerin Perna.     Henreitta Leber

## 2022-11-06 NOTE — Telephone Encounter (Signed)
Left message to call back to get more info. Per Dr. Kerin Perna he ordered the same CT he ordered last year, protocol shouldn't be any different. Illene Boswell, [facility] 11/06/2022 14:38.

## 2022-11-07 ENCOUNTER — Inpatient Hospital Stay
Admission: RE | Admit: 2022-11-07 | Discharge: 2022-11-07 | Disposition: A | Discharge status: Home / Self Care | Payer: 59 | Source: Ambulatory Visit | Attending: Diagnostic Radiology | Admitting: Diagnostic Radiology

## 2022-11-07 ENCOUNTER — Other Ambulatory Visit: Payer: Self-pay

## 2022-11-07 DIAGNOSIS — Z905 Acquired absence of kidney: Secondary | ICD-10-CM

## 2022-11-07 DIAGNOSIS — C642 Malignant neoplasm of left kidney, except renal pelvis: Secondary | ICD-10-CM | POA: Insufficient documentation

## 2022-11-27 ENCOUNTER — Ambulatory Visit (INDEPENDENT_AMBULATORY_CARE_PROVIDER_SITE_OTHER): Payer: Self-pay | Admitting: Urology

## 2023-02-05 ENCOUNTER — Other Ambulatory Visit (HOSPITAL_COMMUNITY): Payer: Self-pay | Admitting: Nephrology

## 2023-02-05 DIAGNOSIS — T8249XA Other complication of vascular dialysis catheter, initial encounter: Secondary | ICD-10-CM

## 2023-02-07 ENCOUNTER — Ambulatory Visit
Admission: RE | Admit: 2023-02-07 | Discharge: 2023-02-07 | Disposition: A | Discharge status: Home / Self Care | Payer: 59 | Source: Ambulatory Visit | Attending: Nephrology | Admitting: Nephrology

## 2023-02-07 ENCOUNTER — Other Ambulatory Visit: Payer: Self-pay

## 2023-02-07 ENCOUNTER — Other Ambulatory Visit (HOSPITAL_COMMUNITY): Payer: Self-pay

## 2023-02-07 DIAGNOSIS — T82858A Stenosis of vascular prosthetic devices, implants and grafts, initial encounter: Secondary | ICD-10-CM | POA: Insufficient documentation

## 2023-02-07 DIAGNOSIS — Y832 Surgical operation with anastomosis, bypass or graft as the cause of abnormal reaction of the patient, or of later complication, without mention of misadventure at the time of the procedure: Secondary | ICD-10-CM | POA: Insufficient documentation

## 2023-02-07 DIAGNOSIS — T8249XA Other complication of vascular dialysis catheter, initial encounter: Secondary | ICD-10-CM

## 2023-02-07 MED ORDER — FENTANYL (PF) 50 MCG/ML INJECTION SOLUTION
Freq: Once | INTRAMUSCULAR | Status: AC | PRN
Start: 2023-02-07 — End: 2023-02-07
  Administered 2023-02-07: 100 ug via INTRAVENOUS

## 2023-02-07 MED ORDER — FENTANYL (PF) 50 MCG/ML INJECTION SOLUTION
INTRAMUSCULAR | Status: AC
Start: 2023-02-07 — End: 2023-02-07
  Filled 2023-02-07: qty 2

## 2023-02-07 MED ORDER — LIDOCAINE HCL 10 MG/ML (1 %) INJECTION SOLUTION
Freq: Once | INTRAMUSCULAR | Status: AC | PRN
Start: 2023-02-07 — End: 2023-02-07
  Administered 2023-02-07: 3 mL via INTRADERMAL

## 2023-02-07 MED ORDER — HEPARIN (PORCINE) 1,000 UNIT/ML INJECTION SOLUTION
Freq: Once | INTRAMUSCULAR | Status: AC | PRN
Start: 2023-02-07 — End: 2023-02-07
  Administered 2023-02-07: 1000 [IU] via INTRAVENOUS

## 2023-02-07 MED ORDER — HEPARIN (PORCINE) (PF) 2,000 UNIT/1,000 ML IN 0.9 % SODIUM CHLORIDE IV
INTRAVENOUS | Status: AC
Start: 2023-02-07 — End: 2023-02-07
  Filled 2023-02-07: qty 2000

## 2023-02-07 MED ORDER — HEPARIN (PORCINE) 1,000 UNIT/ML INJECTION SOLUTION
INTRAMUSCULAR | Status: AC
Start: 2023-02-07 — End: 2023-02-07
  Filled 2023-02-07: qty 1

## 2023-02-07 MED ORDER — LIDOCAINE HCL 10 MG/ML (1 %) INJECTION SOLUTION
INTRAMUSCULAR | Status: AC
Start: 2023-02-07 — End: 2023-02-07
  Filled 2023-02-07: qty 20

## 2023-03-14 ENCOUNTER — Ambulatory Visit (INDEPENDENT_AMBULATORY_CARE_PROVIDER_SITE_OTHER): Payer: 59 | Admitting: "Endocrinology

## 2023-03-26 DIAGNOSIS — I2510 Atherosclerotic heart disease of native coronary artery without angina pectoris: Secondary | ICD-10-CM

## 2023-03-26 DIAGNOSIS — I214 Non-ST elevation (NSTEMI) myocardial infarction: Secondary | ICD-10-CM

## 2023-03-26 DIAGNOSIS — I509 Heart failure, unspecified: Secondary | ICD-10-CM

## 2023-03-26 HISTORY — DX: Heart failure, unspecified: I50.9

## 2023-03-26 HISTORY — DX: Non-ST elevation (NSTEMI) myocardial infarction: I21.4

## 2023-03-26 HISTORY — DX: Atherosclerotic heart disease of native coronary artery without angina pectoris: I25.10

## 2023-04-09 ENCOUNTER — Encounter (INDEPENDENT_AMBULATORY_CARE_PROVIDER_SITE_OTHER): Payer: Self-pay | Admitting: Urology

## 2023-04-09 ENCOUNTER — Other Ambulatory Visit: Payer: Self-pay

## 2023-04-09 ENCOUNTER — Ambulatory Visit: Payer: 59 | Attending: Urology | Admitting: Urology

## 2023-04-09 VITALS — BP 130/73 | HR 87 | Temp 98.6°F | Ht 67.0 in | Wt 241.4 lb

## 2023-04-09 DIAGNOSIS — Z85528 Personal history of other malignant neoplasm of kidney: Secondary | ICD-10-CM

## 2023-04-09 DIAGNOSIS — C642 Malignant neoplasm of left kidney, except renal pelvis: Secondary | ICD-10-CM | POA: Insufficient documentation

## 2023-04-09 NOTE — Assessment & Plan Note (Signed)
No evidence of recurrence on imaging.  Continue surveillance as per NCCN guidelines.

## 2023-04-09 NOTE — Progress Notes (Signed)
[facility]  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  Continue surveillance as per NCCN guidelines.      Orders Placed This Encounter    CT CHEST ABDOMEN PELVIS WO IV CONTRAST       Return in about 1 year (around 04/08/2024) for after CT scan.         Chief Complaint:   Chief Complaint   Patient presents with    CT Results     6 month follow up after CT scan       History of Present Illness:    57 y.o. female presents for follow-up for clear cell renal cell carcinoma.  She had initially had a partial nephrectomy in 2019 for a low-grade clear cell renal cell carcinoma.  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  She has not had any evidence of recurrence on surveillance since then.  However, she has required dialysis since a few months after losing her kidney.  Her most recent surveillance CT scan from February 13th shows no evidence of local recurrence in the renal fossa, normal morphology of her contralateral remaining kidney.  No retroperitoneal lymphadenopathy and no pulmonary lesions.  There was also no nephrolithiasis in her solitary kidney, which is important given her upcoming surgery for hyperparathyroidism.    Past Medical History:    Past Medical History:   Diagnosis Date    Anxiety     Arthritis     hips    Beta blocker prescribed for left ventricular systolic dysfunction     Labetalol     Cancer (CMS HCC) 2019, 2021    kidney, Clear Cell Cancer left kidney-S/P partial nephrectomy 2019, then radical nephrectomy 2021    Cataract     S/P cataract extraction bilaterally     Cellulitis 10/24/14    left foot    Cellulitis 10/21/2020    LLE  extremity 09/2020-resolved per patient     Chronic pain     sec. 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 body habitus; Left vent. EF est. 55-60%; mildy calcified aortic vlave leaflets-especially right coroanry cusp; Small mobile density on aortic valve-no aortic valve stenosis or regurg;  Concentric remodeling; mild MR; Trace AR; Trileaflet aortic vlave; Nl IVC size wiht <50% insp. collapse;       Hyperlipidemia     Hyperparathyroidism (CMS HCC)     Hyperthyroidism     Old records states hyperthyroidism, patient denies, states has hyperparathyroidism    Irritable bowel syndrome     Macular edema     Morbid obesity (CMS HCC)     Obesity     Panic attack     Peripheral edema     ankles/feet    Peripheral neuropathy     bilateral feet    Pneumonia 07/2019; 09/2020    COVID Pneumonia 07/2019; fluid overload/pneumonia with recent hosp. 09/2020    Shortness of breath     SOB after climbing 8 steps     Type 2 diabetes mellitus (CMS HCC)     Wears glasses     reading    White coat hypertension            Past Surgical History:  Past Surgical History:   Procedure Laterality Date    AV FISTULA PLACEMENT Left 08/11/2020    LUE brachio cephalic fistula creation    ECTOPIC PREGNANCY SURGERY  05/24/1998    HX CATARACT REMOVAL Bilateral 2016    HX CHOLECYSTECTOMY   09/10/96    HX HAND SURGERY Right 2017    Trigger finger release 3rd finger    HX OTHER Right 10/18/2020    tunneled dialysis catheter    HX PELVIC LAPAROSCOPY  05/24/98    Ectopic pregnancy    HX SHOULDER SURGERY Left 2017    Procedure for frozen shoulder    HX TUBAL LIGATION  09/10/2012    Filshie Clips    HYMENECTOMY  1996    KIDNEY SURGERY  10/24/17; 03/24/20    LAPAROSCOPIC NEPHRECTOMY, HAND ASSISTED Left 03/24/2020    Radical    LAPAROSCOPIC PARTIAL NEPHRECTOMY Left 10/24/2017    hand-assisted    RENAL BIOPSY, PERCUTANEOUS  03/08/2020    clear cell renal cell carcinoma           Review of Systems:  Constitutional: No fever, chills, or weight loss  GU: As per HPI    Allergies:  Allergies   Allergen Reactions    Gabapentin Itching    Hydrocodone     Ibuprofen     Vicodin [Hydrocodone-Acetaminophen] Itching       Home Medications:  Prior to Admission medications    Medication Sig Start Date End Date Taking? Authorizing Provider   albuterol sulfate (PROVENTIL OR VENTOLIN OR PROAIR) 90 mcg/actuation Inhalation HFA Aerosol Inhaler Take 2 Puffs by inhalation Every 4 hours as needed  Patient not taking: Reported on 04/09/2023 08/22/19   Daryl Eastern, MD   ALPRAZolam Prudy Feeler) 0.25 mg Oral Tablet Take 1 Tablet (0.25 mg total) by mouth Once per day as needed for Anxiety 06/03/20   Provider, Historical   atorvastatin (LIPITOR) 20 mg Oral Tablet Take 1 Tablet (20 mg total) by mouth Every evening   Yes Provider, Historical   calcitrioL (ROCALTROL) 0.25 mcg Oral Capsule Take 1 Capsule (0.25 mcg total) by mouth 08/03/20  Yes Provider, Historical   cinacalcet (SENSIPAR) 60 mg Oral Tablet TAKE 1 TABLET BY MOUTH 3 TIMES A WEEK AS DIRECTED (MON, WED, FRI) AFTER DIALYSIS WITH AFTERNOON OR EVENING MEAL 01/25/23  Yes Provider, Historical   cyclobenzaprine (FLEXERIL) 5 mg Oral Tablet Take 1 Tablet (5 mg total) by mouth Three times a day as needed for Muscle spasms  Yes Provider, Historical   diatrizoate meg-diatrizoat sod 66-10 % Oral  Solution Mix entire bottle of Gastrografin/Gastroview with 32 oz of your choice Non Carbonated beverage (Water, Juice, Gatorade, etc). Complete entire mixture 1 hour prior to exam arrival.  Patient not taking: Reported on 04/09/2023 11/02/22   Sheliah Plane, FNP-BC   escitalopram oxalate (LEXAPRO) 10 mg Oral Tablet Take 1 Tablet (10 mg total) by mouth Every night   Yes Provider, Historical   Fish Oil-Omega-3 Fatty Acids 360-1,200 mg Oral Capsule Take 2 Capsules by mouth Twice daily   Yes Provider, Historical   fluticasone (FLONASE) 50 mcg/actuation Nasal Spray, Suspension Administer 1 Spray into each nostril Once per day as needed (Allergy symptoms (nasal congestion/ runny nose))   Yes Provider, Historical   iron sucrose (VENOFER) 50 mg iron/2.5 mL Intravenous Solution 2.5 mL (50 mg total) 08/22/21  Yes Provider, Historical   JANUVIA 50 mg Oral Tablet Take 1 Tablet (50 mg total) by mouth Every morning  Patient not taking: Reported on 04/09/2023 04/19/20   Provider, Historical   labetaloL (NORMODYNE) 200 mg Oral Tablet Take 1 Tablet (200 mg total) by mouth Every 8 hours for 30 days Indications: Hold for SBP<120 or HR<55 05/26/21 10/13/21  Eloisa Northern, MD   loperamide (IMODIUM) 2 mg Oral Capsule Take 2 Capsules (4 mg total) by mouth Every 6 hours as needed (Diarrhea)    Provider, Historical   loratadine (CLARITIN) 10 mg Oral Tablet Take 1 Tablet (10 mg total) by mouth Once per day as needed (Allergies)    Provider, Historical   losartan (COZAAR) 25 mg Oral Tablet Take 1 Tablet (25 mg total) by mouth Once a day for 30 days  Patient taking differently: Take 1 Tablet (25 mg total) by mouth Take in the evening on non-dialysis days (Sun/Tues/Thurs/Sat); does not take on dialysis days 03/30/20 04/09/23 Yes Wandra Feinstein, MD   MOUNJARO 7.5 mg/0.5 mL Subcutaneous Pen Injector Inject 0.5 mL (7.5 mg total) under the skin Every 7 days 01/23/23  Yes Provider, Historical   omega-3-DHA-EPA-fish oil (FISH OIL) 1,000 mg (120 mg-180 mg) Oral Capsule  Take by mouth 09/30/21  Yes Provider, Historical   promethazine-dextromethorphan (PHENERGAN-DM) 6.25-15 mg/5 mL Oral Syrup TAKE 5 ML BY MOUTH 4 TIMES DAILY AS NEEDED FOR COUGH    Provider, Historical   sevelamer carbonate (RENVELA) 800 mg Oral Tablet Take 2 Tablets (1,600 mg total) by mouth 03/08/21  Yes Provider, Historical   traMADoL (ULTRAM) 50 mg Oral Tablet Take 1 Tablet (50 mg total) by mouth Every 6 hours as needed for Pain 10/28/20   Myrlene Broker, MD   TRIPHROCAPS 1 mg Oral Capsule Take 1 Capsule by mouth Every night   Yes Provider, Historical   vitamin E 100 unit Oral Capsule Take 1 Capsule (100 Units total) by mouth Twice daily   Yes Provider, Historical   vitamin E, dl, acetate, (AQUASOL E) 45 mg (100 unit) Oral Capsule Take by mouth  Patient not taking: Reported on 04/09/2023 09/30/21   Provider, Historical       Social History:  Social History     Tobacco Use    Smoking status: Never    Smokeless tobacco: Never   Vaping Use    Vaping status: Never Used   Substance Use Topics    Alcohol use: No    Drug use: No       Family History:  Family Medical History:       Problem Relation (Age of Onset)  Breast Cancer Paternal Aunt    Cancer Maternal Grandmother    Diabetes Father, Paternal Aunt    High Cholesterol Maternal Uncle, Mother    Hypertension (High Blood Pressure) Maternal Uncle, Father              OBJECTIVE:    Vitals:  BP 130/73   Pulse 87   Temp 37 C (98.6 F)   Ht 1.702 m (5\' 7" )   Wt 109 kg (241 lb 6.4 oz)   LMP 10/24/2014 (Approximate)   BMI 37.81 kg/m         Physical examination:  Constitutional: Appears well, no acute distress.    Respiratory: Non-labored breathing  GI: not distended  GU: deferred  Musculoskeletal: Moves all extremities   Psychiatric: Mood and affect normal.     Labs:  Pertinent labs reviewed.        Imaging studies:  Pertinent imaging studies reviewed.      Wandra Feinstein, MD 04/09/2023 10:55    This note was completed using voice recognition technology. Please excuse any  errors that have occurred as a result and contact me if any parts of the note are unclear.

## 2023-04-21 ENCOUNTER — Encounter (HOSPITAL_COMMUNITY): Payer: Self-pay

## 2023-04-21 ENCOUNTER — Inpatient Hospital Stay
Admission: EM | Admit: 2023-04-21 | Discharge: 2023-04-25 | DRG: 321 | Disposition: A | Discharge status: Home / Self Care | Payer: 59 | Attending: HOSPITALIST | Admitting: HOSPITALIST

## 2023-04-21 ENCOUNTER — Inpatient Hospital Stay (HOSPITAL_COMMUNITY): Payer: 59 | Admitting: Internal Medicine

## 2023-04-21 ENCOUNTER — Emergency Department (HOSPITAL_COMMUNITY): Payer: 59

## 2023-04-21 ENCOUNTER — Other Ambulatory Visit: Payer: Self-pay

## 2023-04-21 DIAGNOSIS — I10 Essential (primary) hypertension: Secondary | ICD-10-CM | POA: Diagnosis present

## 2023-04-21 DIAGNOSIS — I132 Hypertensive heart and chronic kidney disease with heart failure and with stage 5 chronic kidney disease, or end stage renal disease: Secondary | ICD-10-CM | POA: Diagnosis present

## 2023-04-21 DIAGNOSIS — E875 Hyperkalemia: Secondary | ICD-10-CM

## 2023-04-21 DIAGNOSIS — Z992 Dependence on renal dialysis: Secondary | ICD-10-CM

## 2023-04-21 DIAGNOSIS — N2581 Secondary hyperparathyroidism of renal origin: Secondary | ICD-10-CM | POA: Diagnosis present

## 2023-04-21 DIAGNOSIS — I350 Nonrheumatic aortic (valve) stenosis: Secondary | ICD-10-CM | POA: Diagnosis present

## 2023-04-21 DIAGNOSIS — F32A Depression, unspecified: Secondary | ICD-10-CM | POA: Diagnosis present

## 2023-04-21 DIAGNOSIS — Z6837 Body mass index (BMI) 37.0-37.9, adult: Secondary | ICD-10-CM

## 2023-04-21 DIAGNOSIS — E669 Obesity, unspecified: Secondary | ICD-10-CM | POA: Diagnosis present

## 2023-04-21 DIAGNOSIS — E119 Type 2 diabetes mellitus without complications: Secondary | ICD-10-CM | POA: Diagnosis present

## 2023-04-21 DIAGNOSIS — E1122 Type 2 diabetes mellitus with diabetic chronic kidney disease: Secondary | ICD-10-CM | POA: Diagnosis present

## 2023-04-21 DIAGNOSIS — I214 Non-ST elevation (NSTEMI) myocardial infarction: Principal | ICD-10-CM | POA: Diagnosis present

## 2023-04-21 DIAGNOSIS — I2510 Atherosclerotic heart disease of native coronary artery without angina pectoris: Secondary | ICD-10-CM | POA: Diagnosis present

## 2023-04-21 DIAGNOSIS — Z515 Encounter for palliative care: Secondary | ICD-10-CM

## 2023-04-21 DIAGNOSIS — Z7982 Long term (current) use of aspirin: Secondary | ICD-10-CM

## 2023-04-21 DIAGNOSIS — Z79899 Other long term (current) drug therapy: Secondary | ICD-10-CM

## 2023-04-21 DIAGNOSIS — Z8249 Family history of ischemic heart disease and other diseases of the circulatory system: Secondary | ICD-10-CM

## 2023-04-21 DIAGNOSIS — K760 Fatty (change of) liver, not elsewhere classified: Secondary | ICD-10-CM | POA: Diagnosis present

## 2023-04-21 DIAGNOSIS — E1142 Type 2 diabetes mellitus with diabetic polyneuropathy: Secondary | ICD-10-CM | POA: Diagnosis present

## 2023-04-21 DIAGNOSIS — N186 End stage renal disease: Secondary | ICD-10-CM | POA: Diagnosis present

## 2023-04-21 DIAGNOSIS — F410 Panic disorder [episodic paroxysmal anxiety] without agoraphobia: Secondary | ICD-10-CM | POA: Diagnosis present

## 2023-04-21 DIAGNOSIS — Z7985 Long-term (current) use of injectable non-insulin antidiabetic drugs: Secondary | ICD-10-CM

## 2023-04-21 DIAGNOSIS — I120 Hypertensive chronic kidney disease with stage 5 chronic kidney disease or end stage renal disease: Secondary | ICD-10-CM

## 2023-04-21 DIAGNOSIS — E7800 Pure hypercholesterolemia, unspecified: Secondary | ICD-10-CM

## 2023-04-21 DIAGNOSIS — Z8616 Personal history of COVID-19: Secondary | ICD-10-CM

## 2023-04-21 DIAGNOSIS — Z905 Acquired absence of kidney: Secondary | ICD-10-CM

## 2023-04-21 DIAGNOSIS — I249 Acute ischemic heart disease, unspecified: Secondary | ICD-10-CM

## 2023-04-21 DIAGNOSIS — G8929 Other chronic pain: Secondary | ICD-10-CM | POA: Diagnosis present

## 2023-04-21 DIAGNOSIS — Z7902 Long term (current) use of antithrombotics/antiplatelets: Secondary | ICD-10-CM

## 2023-04-21 DIAGNOSIS — E1140 Type 2 diabetes mellitus with diabetic neuropathy, unspecified: Secondary | ICD-10-CM | POA: Diagnosis present

## 2023-04-21 DIAGNOSIS — I5021 Acute systolic (congestive) heart failure: Secondary | ICD-10-CM | POA: Diagnosis present

## 2023-04-21 DIAGNOSIS — C649 Malignant neoplasm of unspecified kidney, except renal pelvis: Secondary | ICD-10-CM | POA: Diagnosis present

## 2023-04-21 DIAGNOSIS — D631 Anemia in chronic kidney disease: Secondary | ICD-10-CM | POA: Diagnosis present

## 2023-04-21 DIAGNOSIS — E11311 Type 2 diabetes mellitus with unspecified diabetic retinopathy with macular edema: Secondary | ICD-10-CM | POA: Diagnosis present

## 2023-04-21 LAB — COMPREHENSIVE METABOLIC PANEL, NON-FASTING
ALBUMIN: 3.1 g/dL — ABNORMAL LOW (ref 3.5–5.0)
ALKALINE PHOSPHATASE: 109 U/L (ref 50–130)
ALT (SGPT): 10 U/L (ref 8–22)
ANION GAP: 11 mmol/L (ref 4–13)
AST (SGOT): 31 U/L (ref 8–45)
BILIRUBIN TOTAL: 0.5 mg/dL (ref 0.3–1.3)
BUN/CREA RATIO: 4 — ABNORMAL LOW (ref 6–22)
BUN: 20 mg/dL (ref 8–25)
CALCIUM: 10.7 mg/dL — ABNORMAL HIGH (ref 8.6–10.2)
CHLORIDE: 100 mmol/L (ref 96–111)
CO2 TOTAL: 30 mmol/L (ref 22–30)
CREATININE: 5.12 mg/dL — ABNORMAL HIGH (ref 0.60–1.05)
ESTIMATED GFR - FEMALE: 9 mL/min/BSA — ABNORMAL LOW (ref >=60)
GLUCOSE: 168 mg/dL — ABNORMAL HIGH (ref 65–125)
POTASSIUM: 4.3 mmol/L (ref 3.5–5.1)
PROTEIN TOTAL: 7.1 g/dL (ref 6.4–8.3)
SODIUM: 141 mmol/L (ref 136–145)

## 2023-04-21 LAB — CBC WITH DIFF
BASOPHIL #: <0.1 10*3/uL (ref <=0.20)
BASOPHIL %: 0 %
EOSINOPHIL #: <0.1 10*3/uL (ref <=0.50)
EOSINOPHIL %: 1 %
HCT: 29.7 % — ABNORMAL LOW (ref 34.8–46.0)
HGB: 10 g/dL — ABNORMAL LOW (ref 11.5–16.0)
IMMATURE GRANULOCYTE #: <0.1 10*3/uL (ref <0.10)
IMMATURE GRANULOCYTE %: 0 % (ref 0.0–1.0)
LYMPHOCYTE #: 1.06 10*3/uL (ref 1.00–4.80)
LYMPHOCYTE %: 17 %
MCH: 34.4 pg — ABNORMAL HIGH (ref 26.0–32.0)
MCHC: 33.7 g/dL (ref 31.0–35.5)
MCV: 102.1 fL — ABNORMAL HIGH (ref 78.0–100.0)
MONOCYTE #: 0.55 10*3/uL (ref 0.20–1.10)
MONOCYTE %: 9 %
MPV: 9.9 fL (ref 8.7–12.5)
NEUTROPHIL #: 4.56 10*3/uL (ref 1.50–7.70)
NEUTROPHIL %: 73 %
PLATELETS: 183 10*3/uL (ref 150–400)
RBC: 2.91 10*6/uL — ABNORMAL LOW (ref 3.85–5.22)
RDW-CV: 15.5 % (ref 11.5–15.5)
WBC: 6.3 10*3/uL (ref 3.7–11.0)

## 2023-04-21 LAB — TROPONIN-I
TROPONIN I: 760 ng/L (ref <=30)
TROPONIN I: 875 ng/L (ref <=30)

## 2023-04-21 LAB — THYROID STIMULATING HORMONE WITH FREE T4 REFLEX: TSH: 0.022 u[IU]/mL — ABNORMAL LOW (ref 0.350–4.940)

## 2023-04-21 LAB — MAGNESIUM: MAGNESIUM: 2.1 mg/dL (ref 1.8–2.6)

## 2023-04-21 LAB — THYROXINE, FREE (FREE T4): THYROXINE (T4), FREE: 2.65 ng/dL — ABNORMAL HIGH (ref 0.70–1.48)

## 2023-04-21 LAB — PARATHYROID HORMONE (PTH): PTH: 67.1 pg/mL (ref 8.5–77.0)

## 2023-04-21 LAB — PHOSPHORUS: PHOSPHORUS: 3.6 mg/dL (ref 2.4–4.7)

## 2023-04-21 MED ORDER — ACETAMINOPHEN 500 MG TABLET
1000.0000 mg | ORAL_TABLET | Freq: Four times a day (QID) | ORAL | Status: DC | PRN
Start: 2023-04-21 — End: 2023-04-25

## 2023-04-21 MED ORDER — ASPIRIN 81 MG CHEWABLE TABLET
81.0000 mg | CHEWABLE_TABLET | Freq: Every day | ORAL | Status: DC
Start: 2023-04-22 — End: 2023-04-25
  Administered 2023-04-22 – 2023-04-25 (×4): 81 mg via ORAL
  Filled 2023-04-21 (×4): qty 1

## 2023-04-21 MED ORDER — SODIUM CHLORIDE 0.9 % (FLUSH) INJECTION SYRINGE
10.0000 mL | INJECTION | Freq: Three times a day (TID) | INTRAMUSCULAR | Status: DC
Start: 2023-04-21 — End: 2023-04-25
  Administered 2023-04-21 – 2023-04-23 (×6): 10 mL via INTRAVENOUS
  Administered 2023-04-23: 0 mL via INTRAVENOUS
  Administered 2023-04-24 – 2023-04-25 (×4): 10 mL via INTRAVENOUS

## 2023-04-21 MED ORDER — PROCHLORPERAZINE EDISYLATE 10 MG/2 ML (5 MG/ML) INJECTION SOLUTION
10.0000 mg | Freq: Four times a day (QID) | INTRAMUSCULAR | Status: DC | PRN
Start: 2023-04-21 — End: 2023-04-25

## 2023-04-21 MED ORDER — LABETALOL 20 MG/4 ML (5 MG/ML) INTRAVENOUS SYRINGE
20.0000 mg | INJECTION | INTRAVENOUS | Status: AC
Start: 2023-04-21 — End: 2023-04-21
  Administered 2023-04-21: 0 mg via INTRAVENOUS
  Administered 2023-04-21: 20 mg via INTRAVENOUS
  Filled 2023-04-21: qty 4

## 2023-04-21 MED ORDER — SODIUM CHLORIDE 0.9 % (FLUSH) INJECTION SYRINGE
10.0000 mL | INJECTION | INTRAMUSCULAR | Status: DC | PRN
Start: 2023-04-21 — End: 2023-04-25

## 2023-04-21 MED ORDER — LABETALOL 100 MG TABLET
200.0000 mg | ORAL_TABLET | Freq: Three times a day (TID) | ORAL | Status: DC
Start: 2023-04-21 — End: 2023-04-25
  Administered 2023-04-21 – 2023-04-22 (×4): 200 mg via ORAL
  Administered 2023-04-23: 0 mg via ORAL
  Administered 2023-04-23 – 2023-04-24 (×5): 200 mg via ORAL
  Administered 2023-04-25: 0 mg via ORAL
  Administered 2023-04-25: 200 mg via ORAL
  Filled 2023-04-21 (×7): qty 2
  Filled 2023-04-21: qty 1
  Filled 2023-04-21 (×4): qty 2

## 2023-04-21 MED ORDER — IOPAMIDOL 370 MG IODINE/ML (76 %) INTRAVENOUS SOLUTION
100.0000 mL | INTRAVENOUS | Status: AC
Start: 2023-04-21 — End: 2023-04-21
  Administered 2023-04-21: 84 mL via INTRAVENOUS

## 2023-04-21 MED ORDER — HEPARIN (PORCINE) 5,000 UNITS/ML BOLUS
60.0000 [IU]/kg | Freq: Once | INTRAMUSCULAR | Status: AC
Start: 2023-04-21 — End: 2023-04-21
  Administered 2023-04-21: 5000 [IU] via INTRAVENOUS
  Filled 2023-04-21: qty 1

## 2023-04-21 MED ORDER — METOPROLOL SUCCINATE ER 25 MG TABLET,EXTENDED RELEASE 24 HR
12.5000 mg | ORAL_TABLET | Freq: Every day | ORAL | Status: DC
Start: 2023-04-22 — End: 2023-04-21

## 2023-04-21 MED ORDER — MAGNESIUM HYDROXIDE 400 MG/5 ML ORAL SUSPENSION
30.0000 mL | Freq: Every evening | ORAL | Status: DC | PRN
Start: 2023-04-21 — End: 2023-04-25

## 2023-04-21 MED ORDER — VITAMIN B COMPLEX-VITAMIN C-FOLIC ACID 0.8 MG TABLET
1.0000 | ORAL_TABLET | Freq: Every evening | ORAL | Status: DC
Start: 2023-04-21 — End: 2023-04-25
  Administered 2023-04-21 – 2023-04-24 (×4): 1 via ORAL
  Filled 2023-04-21 (×5): qty 1

## 2023-04-21 MED ORDER — CYCLOBENZAPRINE 10 MG TABLET
5.0000 mg | ORAL_TABLET | Freq: Three times a day (TID) | ORAL | Status: DC | PRN
Start: 2023-04-21 — End: 2023-04-25

## 2023-04-21 MED ORDER — LOSARTAN 25 MG TABLET
25.0000 mg | ORAL_TABLET | ORAL | Status: DC
Start: 2023-04-21 — End: 2023-04-24
  Administered 2023-04-21 – 2023-04-22 (×2): 25 mg via ORAL
  Filled 2023-04-21 (×2): qty 1

## 2023-04-21 MED ORDER — MELATONIN 3 MG TABLET
9.0000 mg | ORAL_TABLET | Freq: Every evening | ORAL | Status: DC
Start: 2023-04-21 — End: 2023-04-25
  Administered 2023-04-21 – 2023-04-24 (×4): 9 mg via ORAL
  Filled 2023-04-21 (×4): qty 3

## 2023-04-21 MED ORDER — FENTANYL (PF) 50 MCG/ML INJECTION SOLUTION
100.0000 ug | INTRAMUSCULAR | Status: AC
Start: 2023-04-21 — End: 2023-04-21
  Administered 2023-04-21: 100 ug via INTRAVENOUS
  Filled 2023-04-21: qty 2

## 2023-04-21 MED ORDER — ONDANSETRON 4 MG DISINTEGRATING TABLET
4.0000 mg | ORAL_TABLET | Freq: Four times a day (QID) | ORAL | Status: DC | PRN
Start: 2023-04-21 — End: 2023-04-25
  Administered 2023-04-22: 4 mg via ORAL
  Filled 2023-04-21: qty 1

## 2023-04-21 MED ORDER — SEVELAMER CARBONATE 800 MG TABLET
1600.0000 mg | ORAL_TABLET | Freq: Three times a day (TID) | ORAL | Status: DC
Start: 2023-04-22 — End: 2023-04-24
  Administered 2023-04-22 – 2023-04-23 (×4): 1600 mg via ORAL
  Administered 2023-04-23 (×2): 0 mg via ORAL
  Filled 2023-04-21 (×4): qty 2

## 2023-04-21 MED ORDER — ATORVASTATIN 20 MG TABLET
20.0000 mg | ORAL_TABLET | Freq: Every evening | ORAL | Status: DC
Start: 2023-04-21 — End: 2023-04-23
  Administered 2023-04-21 – 2023-04-22 (×2): 20 mg via ORAL
  Filled 2023-04-21 (×2): qty 1

## 2023-04-21 MED ORDER — ALPRAZOLAM 0.5 MG TABLET
0.2500 mg | ORAL_TABLET | Freq: Three times a day (TID) | ORAL | Status: DC | PRN
Start: 2023-04-21 — End: 2023-04-25
  Administered 2023-04-24: 0.25 mg via ORAL
  Filled 2023-04-21: qty 1

## 2023-04-21 MED ORDER — ZOLPIDEM 5 MG TABLET
5.0000 mg | ORAL_TABLET | Freq: Every evening | ORAL | Status: DC | PRN
Start: 2023-04-21 — End: 2023-04-25
  Administered 2023-04-24: 5 mg via ORAL
  Filled 2023-04-21: qty 1

## 2023-04-21 MED ORDER — TRAMADOL 50 MG TABLET
50.0000 mg | ORAL_TABLET | Freq: Four times a day (QID) | ORAL | Status: DC | PRN
Start: 2023-04-21 — End: 2023-04-25

## 2023-04-21 MED ORDER — SODIUM CHLORIDE 0.9 % (FLUSH) INJECTION SYRINGE
10.0000 mL | INJECTION | Freq: Three times a day (TID) | INTRAMUSCULAR | Status: DC
Start: 2023-04-21 — End: 2023-04-25
  Administered 2023-04-21 – 2023-04-22 (×2): 0 mL via INTRAVENOUS
  Administered 2023-04-22 – 2023-04-23 (×3): 10 mL via INTRAVENOUS
  Administered 2023-04-23 (×2): 0 mL via INTRAVENOUS
  Administered 2023-04-24 (×3): 10 mL via INTRAVENOUS
  Administered 2023-04-25 (×2): 0 mL via INTRAVENOUS

## 2023-04-21 MED ORDER — ESCITALOPRAM 10 MG TABLET
10.0000 mg | ORAL_TABLET | Freq: Every evening | ORAL | Status: DC
Start: 2023-04-21 — End: 2023-04-25
  Administered 2023-04-21 – 2023-04-24 (×4): 10 mg via ORAL
  Filled 2023-04-21 (×4): qty 1

## 2023-04-21 MED ORDER — PROMETHAZINE-DM 6.25 MG-15 MG/5 ML ORAL SYRUP
5.0000 mL | ORAL_SOLUTION | Freq: Four times a day (QID) | ORAL | Status: DC | PRN
Start: 2023-04-21 — End: 2023-04-25
  Filled 2023-04-21 (×4): qty 5

## 2023-04-21 MED ORDER — SODIUM CHLORIDE 0.9% FLUSH BAG - 250 ML
INTRAVENOUS | Status: DC | PRN
Start: 2023-04-21 — End: 2023-04-25

## 2023-04-21 MED ORDER — NITROGLYCERIN 0.4 MG SUBLINGUAL TABLET
0.4000 mg | SUBLINGUAL_TABLET | SUBLINGUAL | Status: DC | PRN
Start: 2023-04-21 — End: 2023-04-25

## 2023-04-21 MED ORDER — HEPARIN (PORCINE) 25,000 UNIT/250 ML IN 0.45 % SODIUM CHLORIDE IV SOLN
12.0000 [IU]/kg/h | INTRAVENOUS | Status: DC
Start: 2023-04-21 — End: 2023-04-23
  Administered 2023-04-21 (×3): 12 [IU]/kg/h via INTRAVENOUS
  Administered 2023-04-22: 14 [IU]/kg/h via INTRAVENOUS
  Administered 2023-04-22 (×3): 18 [IU]/kg/h via INTRAVENOUS
  Administered 2023-04-22: 14 [IU]/kg/h via INTRAVENOUS
  Administered 2023-04-23: 17 [IU]/kg/h via INTRAVENOUS
  Administered 2023-04-23: 18 [IU]/kg/h via INTRAVENOUS
  Administered 2023-04-23: 0 [IU]/kg/h via INTRAVENOUS
  Filled 2023-04-21 (×2): qty 250

## 2023-04-21 MED ORDER — ASPIRIN 81 MG CHEWABLE TABLET
324.0000 mg | CHEWABLE_TABLET | ORAL | Status: AC
Start: 2023-04-21 — End: 2023-04-21
  Administered 2023-04-21: 324 mg via ORAL
  Filled 2023-04-21: qty 4

## 2023-04-21 NOTE — ACP (Advance Care Planning) (Signed)
Goals of Care  (GOC) Documentation    Purpose of Encounter: evaluationdiscussion    Parties in Attendance: Patient and Family (daughter)    Patient's Decisional Capacity (Name of surrogate if no capacity)  possesses medical decision making capacity     HCS: husband, Reuel Boom (or daughter, Diannia Ruder)    Subjective/Patient Story:  chest pain    Objective/Medical Story:  NSTEMI;  I discussed resuscitation measures to include chest compressions, defibrillation, intubation, and mechanical ventilation. The patient would like all measures necessary to keep her alive if needed.      Goals of Care Determinations/**CODE STATUS**:     FULL CODE         Plan:  Patient Active Problem List   Diagnosis    Diabetic foot infection (CMS HCC)  (CMS HCC)    Type 2 diabetes mellitus (CMS HCC)    Cellulitis of foot, left    Essential hypertension    Obesity    C. 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 Advance directives, POLST, MOLST): no      Time spent discussing advance care planning (ACP) - required if submitting a charge for an ACP discussion: 30 min

## 2023-04-21 NOTE — Nurses Notes (Signed)
PT arrived the unit at 2140 and ambulated self to bathroom. PT expresses feeling better since she arrived to the hospital and rates pain in chest to be 1 out of 10 where 10 is worst pain. VS taken and connected to monitor.

## 2023-04-21 NOTE — H&P (Signed)
[facility]  [place], [place] [address]    General Admission History and Physical  Conley Canal, FNP-C        [name], [name]  Date of Admission:  04/21/2023  Date of Birth:  1966/03/07    PCP: Sallye Lat, NP      Chief Complaint:  chest pain  Admission diagnosis: Non-ST Elevated Myocardial Infarction (NSTEMI)      HPI: [name] is a 57 y.o., White female who presented to the Emergency Department with chest pain  Per ED documentation... to the Emergency Department with intermittent chest pain that began this morning (04/21/23). The patient had a parathyroidectomy at [facility] 5 days ago (04/16/23). The patient notes she had hyperkalemia at the hospital after her surgery. The patient has a history of nephrectomy and attends dialysis on Monday, Wednesday, and Friday. Patient denies measured fevers, vomiting, shortness of breath, trauma, fall, or syncope.       Work-up in the ED included labs revealing an elevated troponin. The patient will be admitted for further evaluation and treatment.      Co-morbidities: anxiety/depression, cancer history (renal), chronic pain, DM II, END STAGE RENAL DISEASE on HG, HTN, obesity, peripheral edema and neuropathy  Smoker: no  Alcohol use: no  Illicit drug use: no        ROS: Other than ROS in the HPI, all other systems were negative.    There are no active hospital problems to display for this patient.      Social History     Tobacco Use    Smoking status: Never    Smokeless tobacco: Never   Substance Use Topics    Alcohol use: No       Family Medical History:       Problem Relation (Age of Onset)    Breast Cancer Paternal Aunt    Cancer Maternal Grandmother    Diabetes Father, Paternal Aunt    High Cholesterol Maternal Uncle, Mother    Hypertension (High Blood Pressure) Maternal Uncle, Father          The patient denies any other known family history of cardiac, pulmonary, renal, intestinal, or blood disorders, including malignancy. No known  family history of psychiatric disorders.            EXAM:  Temperature: 37.2 C (99 F)  Heart Rate: (!) 113  BP (Non-Invasive): (!) 161/83  Respiratory Rate: 17  SpO2: 95 %  General: appears obese and tearful. Acutely ill but in no acute distress.   Eyes: Pupils equal and round, reactive to light    HEENT: Head atraumatic and normocephalic   Neck: No JVD or thyromegaly or lymphadenopathy   Lungs: Clear to auscultation bilaterally.   Cardiovascular: regular rate and rhythm, S1, S2 normal, no murmur  Abdomen: Soft, non-tender, bowel sounds normal   Extremities: extremities normal, atraumatic, no cyanosis or edema   Skin: Skin warm and dry   Neurologic: Grossly normal   Lymphatics: No lymphadenopathy   Psychiatric: tearful affect, behavior       Assessment/ Plan:     *Elements of Medical Decision Making    >External note reviewed from Epic and Care Everywhere dated 04/17/23 (day surgery at [facility] for parathyroidectomy; discharged home)    >Results reviewed prior to this exam   WBCs   H&H   Sodium   Potassium   CT   EKG   Troponin   >Testing ordered at this time   Trend troponin  TSH/free T4   Magnesium    Repeat CBC in AM   Repeat BMP in AM  >Discussion with Dr. Ashok Cordia, cardiologist, and Dr. Webb Silversmith, nephrologist, regarding treatment plan and goals      Imaging Studies:    Interpretation by radiologist reviewed and individually interpreted by me:    CT for pulmonary embolism  IMPRESSION:  1. No pulmonary embolus.  2. Abnormal fluid and air in the anterior neck extending from the thyroid fossa to the edge of the upper mediastinum. Superior most extent of this is not visualized.  The Epic database indicates that the patient has undergone subtotal parathyroidectomy on April 17, 2023. Findings in the CT likely reflect postoperative appearance. Infection or inflammation is not excluded based on imaging and clinical correlation recommended. Recommend follow-up to show resolution. If needed, CT soft tissue neck with IV  contrast can BE obtained for additional assessment.       __________________________    Acute chest pain  Non ST-Elevation Myocardial Infarction (NSTEMI)   -Currently rates pain 8/10  -Troponin elevated to 760  -EKG with anterior-lateral abnormalities  -Follow chest pain protocol  -Continue to trend troponins  -Keep K+ >4 (4.3 now) and Mg++ >2 (added to ED labs  -Monitor on telemetry  -Goal-Directed Medical Therapy   -(ASA) 81 mg daily (324 mg chewable given in ED)   -(statin) atorvastatin 20 mg every evening as at home   -(ACEI/ARB) losartan 25 mg (already on this on non-dialysis days; continue)   -(b-blocker) labetalol 200 mg Q8H as at home   -start IV heparin drip and follow protocol  -Changes: per cardiology  -Cardiac diet  -Cardiology consult      End Stage Renal Disease on hemodialysis  Hyperparathyroidism with recent parathyroidectomy (04/17/23)  -HD days are MWF  -left upper arm fistula with good thrill and bruit   -continue sevelamer and vit B/C complex  -avoid unnecessary nephrotoxins, renal dose meds when indicated, monitor renal function labs as indicated    Hypertension - SBP 160s to 180s; continue labetalol PO and losartan on non-dialysis days as at home; monitor BP trends  Diabetes Mellitus II, controlled - glucose 168 but last HA1c on 04/09/23 was 5.8;takes Mounjaro every 7 days at home; FSBS with SSIP; diabetic diet; continue diabetic teaching  Anxiety/Depression - continue Lexapro and alprazolam as at home            EDUCATION and Plan of Care:  Discussion with the patient/caregiver was done concerning definition, symptoms, testing, treatment and lifestyle modifications for the following diseases/disorders:    -NSTEMI (initial trop 760 with continued chest pain)  -IV heparin drip  -trend troponins  -ESRD on HD MWF; consult nephrology  -recent parathyroidectomy (04/17/23)  -consult cardiology        Disposition Home when medically optimized; hopeful for 2-3 days max but this will be pending test  results, need for further testing, or further complications.        DVT prophylaxis: IV heparin  GI prophylaxis: none indicated   Code status: Full code as discussed with patient and daughter        I spent a total of 57 minutes in direct/indirect care of this patient including initial evaluation, review of laboratory, radiology, diagnostic studies, review of medical record, order entry and coordination of care.      Conley Canal, FNP-C

## 2023-04-21 NOTE — Care Plan (Signed)
Problem: Adult Inpatient Plan of Care  Goal: Plan of Care Review  Outcome: Ongoing (see interventions/notes)  Goal: Patient-Specific Goal (Individualized)  Outcome: Ongoing (see interventions/notes)  Flowsheets (Taken 04/21/2023 2200)  Patient would like to participate in bedside shift report: Yes  Individualized Care Needs: Denies  Anxieties, Fears or Concerns: Denies  Patient-Specific Goals (Include Timeframe): None verbalized  Plan of Care Reviewed With: patient  Goal: Absence of Hospital-Acquired Illness or Injury  Outcome: Ongoing (see interventions/notes)  Intervention: Identify and Manage Fall Risk  Recent Flowsheet Documentation  Taken 04/21/2023 2200 by Theodoro Grist, RN  Safety Promotion/Fall Prevention:   activity supervised   fall prevention program maintained   muscle strengthening facilitated   nonskid shoes/slippers when out of bed   motion sensor pad activated   safety round/check completed  Intervention: Prevent Skin Injury  Recent Flowsheet Documentation  Taken 04/21/2023 2200 by Theodoro Grist, RN  Body Position: supine, head elevated  Skin Protection:   adhesive use limited   pulse oximeter probe site changed   transparent dressing maintained   tubing/devices free from skin contact   electrode sites changed  Intervention: Prevent Infection  Recent Flowsheet Documentation  Taken 04/21/2023 2200 by Theodoro Grist, RN  Infection Prevention:   barrier precautions utilized   environmental surveillance performed   equipment surfaces disinfected   glycemic control managed   personal protective equipment utilized   promote handwashing   rest/sleep promoted   visitors restricted/screened   single patient room provided  Goal: Optimal Comfort and Wellbeing  Outcome: Ongoing (see interventions/notes)  Intervention: Provide Person-Centered Care  Recent Flowsheet Documentation  Taken 04/21/2023 2200 by Theodoro Grist, RN  Trust Relationship/Rapport:   care explained   choices provided   emotional  support provided   empathic listening provided   questions answered   questions encouraged   reassurance provided   thoughts/feelings acknowledged  Goal: Rounds/Family Conference  Outcome: Ongoing (see interventions/notes)     Problem: Pain Acute  Goal: Optimal Pain Control and Function  Outcome: Ongoing (see interventions/notes)  Intervention: Prevent or Manage Pain  Recent Flowsheet Documentation  Taken 04/21/2023 2200 by Theodoro Grist, RN  Medication Review/Management: medications reviewed  Intervention: Optimize Psychosocial Wellbeing  Recent Flowsheet Documentation  Taken 04/21/2023 2200 by Theodoro Grist, RN  Diversional Activities:   television   smartphone     Problem: Chest Pain  Goal: Resolution of Chest Pain Symptoms  Outcome: Ongoing (see interventions/notes)

## 2023-04-21 NOTE — Progress Notes (Signed)
Aware of consult.   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   Admission for NSTEMI  HTN poorly controlled.   Secondary hyperparathyroidism and patient underwent 3.5 gland surgical parathyroidectomy on 04/17/23. PTH appropriately suppressed. Patients bone-mineral management medications significantly changed: Sensipar stopped, Calcitriol given 1.0 mcg 3x/week at HD, and patient's binder was changed to Tums to prevent hypocalcemia.   Currently has mild hypercalcemia with corrected calcium of around 11.4.   Anemia secondary to CKD  Preliminary recommendations:  Treat HTN. Given NSTEMI will defer to cardiology/attending.   Agree with changing binder from Tums back to Sevelamer given the hypercalcemia  Hold Calcitriol/vitamin D  Will plan to do HD next on Monday.   Full consult to follow tomorrow.

## 2023-04-21 NOTE — ED Nurses Note (Signed)
Report called to Frank, RN.

## 2023-04-21 NOTE — ED Triage Notes (Signed)
Pt adds she is S/P parathyroidectomy 5 days ago.

## 2023-04-21 NOTE — ED Nurses Note (Signed)
Called lab to draw Troponin.  They will be here.

## 2023-04-21 NOTE — ED Nurses Note (Signed)
Conley Canal NP at bedside at this time

## 2023-04-21 NOTE — ED Provider Notes (Signed)
[name] Balm Jamelah Sitzer, DO, FACEP  [facility] - Emergency Department  ED Attending Note        Chief Complaint   Patient presents with    Chest Pain          HISTORY OF PRESENT ILLNESS     [name], date of birth [birth year], is a 57 y.o.female who presents to the Emergency Department with intermittent chest pain that began this morning (04/21/23). The patient had a parathyroidectomy at [facility] 5 days ago (04/16/23). The patient notes she had hyperkalemia at the hospital after her surgery. The patient has a history of nephrectomy and attends dialysis on Monday, Wednesday, and Friday. Patient denies measured fevers, vomiting, shortness of breath, trauma, fall, or syncope.                                                    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    Constipation     COVID 07/2019    Depression     Diabetes mellitus (CMS HCC)     Diabetes mellitus, type 2 (CMS HCC)     Diarrhea     Dyspnea on exertion     ESRD (end stage renal disease) (CMS HCC)     Essential hypertension     Exercise intolerance     Fatty liver     Fluid overload 08/21/21    Recent hospitalization for fluid overload, HD initiated 10/18/20    Headache(784.0)     Hemodialysis patient (CMS HCC)     HD Monday-Wednesday-Friday    History of kidney disease     CKD-Stage 5-HD initiated 10/18/20    History of nephrectomy 03/24/2020    L nephrectomy    Hx of echocardiogram 10/18/2020     Technically difficult d/t pt. body habitus; Left vent. EF est. 55-60%; mildy calcified aortic vlave leaflets-especially right coroanry cusp; Small mobile density on aortic valve-no aortic valve stenosis or regurg; Concentric remodeling; mild MR; Trace AR; Trileaflet aortic vlave; Nl IVC size wiht <50% insp. collapse;       Hyperlipidemia     Hyperparathyroidism (CMS HCC)     Hyperthyroidism     Old records states hyperthyroidism, patient denies, states has hyperparathyroidism    Irritable bowel syndrome     Macular edema     Morbid obesity (CMS HCC)     Obesity     Panic attack     Peripheral edema     ankles/feet    Peripheral neuropathy     bilateral feet    Pneumonia 07/2019; 09/2020    COVID  Pneumonia 07/2019; fluid overload/pneumonia with recent hosp. 09/2020    Shortness of breath     SOB after climbing 8 steps     Type 2 diabetes mellitus (CMS HCC)     Wears glasses     reading    White coat hypertension        Past Surgical History:  Past Surgical History:   Procedure Laterality Date    Av fistula placement Left 08/11/2020    Ectopic pregnancy surgery  05/24/1998    Hx cataract removal Bilateral 2016    Hx cholecystectomy  09/10/96    Hx hand surgery Right 2017    Hx other Right 10/18/2020    Hx pelvic laparoscopy  05/24/98    Hx shoulder surgery Left 2017    Hx tubal ligation  09/10/2012    Hymenectomy  1996    Kidney surgery  10/24/17; 03/24/20    Laparoscopic nephrectomy, hand assisted Left 03/24/2020    Laparoscopic partial nephrectomy Left 10/24/2017    Renal biopsy, percutaneous  03/08/2020       Family History:  Family Medical History:       Problem Relation (Age of Onset)    Breast Cancer Paternal Aunt    Cancer Maternal Grandmother    Diabetes Father, Paternal Aunt    High Cholesterol Maternal Uncle, Mother    Hypertension (High Blood Pressure) Maternal Uncle, Father              Social History:  Social History     Tobacco Use    Smoking status: Never    Smokeless tobacco: Never   Vaping Use    Vaping  status: Never Used   Substance Use Topics    Alcohol use: No    Drug use: No       Medications:  Current Outpatient Medications   Medication Sig    albuterol sulfate (PROVENTIL OR VENTOLIN OR PROAIR) 90 mcg/actuation Inhalation HFA Aerosol Inhaler Take 2 Puffs by inhalation Every 4 hours as needed (Patient not taking: Reported on 04/09/2023)    ALPRAZolam (XANAX) 0.25 mg Oral Tablet Take 1 Tablet (0.25 mg total) by mouth Once per day as needed for Anxiety    atorvastatin (LIPITOR) 20 mg Oral Tablet Take 1 Tablet (20 mg total) by mouth Every evening    cyclobenzaprine (FLEXERIL) 5 mg Oral Tablet Take 1 Tablet (5 mg total) by mouth Three times a day as needed for Muscle spasms    diatrizoate meg-diatrizoat sod 66-10 % Oral Solution Mix entire bottle of Gastrografin/Gastroview with 32 oz of your choice Non Carbonated beverage (Water, Juice, Gatorade, etc). Complete entire mixture 1 hour prior to exam arrival. (Patient not taking: Reported on 04/09/2023)    escitalopram oxalate (LEXAPRO) 10 mg Oral Tablet Take 1 Tablet (10 mg total) by mouth Every night    Fish Oil-Omega-3 Fatty Acids 360-1,200 mg Oral Capsule Take 2 Capsules by mouth Twice daily    fluticasone (FLONASE) 50 mcg/actuation Nasal Spray, Suspension Administer 1 Spray into each nostril Once per day as needed (Allergy symptoms (nasal congestion/ runny nose))    iron sucrose (VENOFER) 50 mg iron/2.5 mL Intravenous Solution 2.5 mL (50 mg total)    labetaloL (NORMODYNE) 200 mg Oral Tablet Take 1 Tablet (200 mg total) by mouth Every 8 hours for 30 days Indications: Hold for SBP<120 or HR<55    loperamide (IMODIUM) 2 mg Oral Capsule Take 2 Capsules (4 mg total) by mouth Every 6 hours as needed (Diarrhea)  loratadine (CLARITIN) 10 mg Oral Tablet Take 1 Tablet (10 mg total) by mouth Once per day as needed (Allergies)    losartan (COZAAR) 25 mg Oral Tablet Take 1 Tablet (25 mg total) by mouth Once a day for 30 days (Patient taking differently: Take 1 Tablet (25 mg  total) by mouth Take in the evening on non-dialysis days (Sun/Tues/Thurs/Sat); does not take on dialysis days)    MOUNJARO 7.5 mg/0.5 mL Subcutaneous Pen Injector Inject 0.5 mL (7.5 mg total) under the skin Every 7 days    omega-3-DHA-EPA-fish oil (FISH OIL) 1,000 mg (120 mg-180 mg) Oral Capsule Take by mouth    promethazine-dextromethorphan (PHENERGAN-DM) 6.25-15 mg/5 mL Oral Syrup TAKE 5 ML BY MOUTH 4 TIMES DAILY AS NEEDED FOR COUGH    sevelamer carbonate (RENVELA) 800 mg Oral Tablet Take 2 Tablets (1,600 mg total) by mouth    traMADoL (ULTRAM) 50 mg Oral Tablet Take 1 Tablet (50 mg total) by mouth Every 6 hours as needed for Pain    TRIPHROCAPS 1 mg Oral Capsule Take 1 Capsule by mouth Every night    vitamin E 100 unit Oral Capsule Take 1 Capsule (100 Units total) by mouth Twice daily    vitamin E, dl, acetate, (AQUASOL E) 45 mg (100 unit) Oral Capsule Take by mouth (Patient not taking: Reported on 04/09/2023)       Allergies:  Allergies   Allergen Reactions    Gabapentin Itching    Hydrocodone     Ibuprofen     Vicodin [Hydrocodone-Acetaminophen] Itching                                                      PHYSICAL EXAM     Vitals:  ED Triage Vitals   BP (Non-Invasive) 04/21/23 1656 (!) 181/87   Heart Rate 04/21/23 1656 (!) 124   Respiratory Rate 04/21/23 1656 18   Temperature 04/21/23 1656 37.2 C (99 F)   SpO2 04/21/23 1656 99 %   Weight 04/21/23 1655 107 kg (236 lb 5.3 oz)   Height 04/21/23 1655 1.702 m (5\' 7" )     Constitutional: The patient is alert, oriented, non toxic  Head: Atraumatic, normocephalic  Chest: Atraumatic, no tenderness to palpation, clear and equal breath sounds bilaterally. No wheezes, rhonchi or rales.   Cardiovascular: Heart is S1-S2, regular rate and rhythm  Abdomen: Soft, non-tender, non-distended without evidence of rebound or guarding  Skin: No cyanosis, jaundice, rash or lesion.  Neurologic: Alert, oriented, Normal upper and lower extremity strength and grossly normal sensation.    Musculoskeletal: No deformities, trauma, edema.                                                DIAGNOSTIC STUDIES     Labs:    Results for orders placed or performed during the hospital encounter of 04/21/23   COMPREHENSIVE METABOLIC PANEL, NON-FASTING   Result Value Ref Range    SODIUM 141 136 - 145 mmol/L    POTASSIUM 4.3 3.5 - 5.1 mmol/L    CHLORIDE 100 96 - 111 mmol/L    CO2 TOTAL 30 22 - 30 mmol/L    ANION GAP 11 4 - 13 mmol/L  BUN 20 8 - 25 mg/dL    CREATININE 8.29 (H) 0.60 - 1.05 mg/dL    BUN/CREA RATIO 4 (L) 6 - 22    ALBUMIN 3.1 (L) 3.5 - 5.0 g/dL     CALCIUM 93.7 (H) 8.6 - 10.2 mg/dL    GLUCOSE 169 (H) 65 - 125 mg/dL    ALKALINE PHOSPHATASE 109 50 - 130 U/L    ALT (SGPT) 10 8 - 22 U/L    AST (SGOT)  31 8 - 45 U/L    BILIRUBIN TOTAL 0.5 0.3 - 1.3 mg/dL    PROTEIN TOTAL 7.1 6.4 - 8.3 g/dL    ESTIMATED GFR - FEMALE 9 (L) >=60 mL/min/BSA   TROPONIN-I   Result Value Ref Range    TROPONIN I 760 (HH) <=30 ng/L   CBC WITH DIFF   Result Value Ref Range    WBC 6.3 3.7 - 11.0 x10^3/uL    RBC 2.91 (L) 3.85 - 5.22 x10^6/uL    HGB 10.0 (L) 11.5 - 16.0 g/dL    HCT 67.8 (L) 93.8 - 46.0 %    MCV 102.1 (H) 78.0 - 100.0 fL    MCH 34.4 (H) 26.0 - 32.0 pg    MCHC 33.7 31.0 - 35.5 g/dL    RDW-CV 10.1 75.1 - 02.5 %    PLATELETS 183 150 - 400 x10^3/uL    MPV 9.9 8.7 - 12.5 fL    NEUTROPHIL % 73.0 %    LYMPHOCYTE % 17.0 %    MONOCYTE % 9.0 %    EOSINOPHIL % 1.0 %    BASOPHIL % 0.0 %    NEUTROPHIL # 4.56 1.50 - 7.70 x10^3/uL    LYMPHOCYTE # 1.06 1.00 - 4.80 x10^3/uL    MONOCYTE # 0.55 0.20 - 1.10 x10^3/uL    EOSINOPHIL # <0.10 <=0.50 x10^3/uL    BASOPHIL # <0.10 <=0.20 x10^3/uL    IMMATURE GRANULOCYTE % 0.0 0.0 - 1.0 %    IMMATURE GRANULOCYTE # <0.10 <0.10 x10^3/uL   PHOSPHORUS   Result Value Ref Range    PHOSPHORUS 3.6 2.4 - 4.7 mg/dL   Labs reviewed and interpreted by me.    Radiology:    CT ANGIO CHEST FOR PULMONARY EMBOLUS W IV CONTRAST   Final Result   1. No pulmonary embolus.   2. Abnormal fluid and air in the anterior  neck extending from the thyroid fossa to the edge of the upper mediastinum. Superior most extent of this is not visualized.   The Epic database indicates that the patient has undergone subtotal parathyroidectomy on April 17, 2023. Findings in the CT likely reflect postoperative appearance. Infection or inflammation is not excluded based on imaging and clinical correlation recommended. Recommend follow-up to show resolution. If needed, CT soft tissue neck with IV contrast can BE obtained for additional assessment.                  The CT exam was performed using one or more of the following dose reduction techniques: Automated exposure control, adjustment of the mA and/or kV according to the patient's size, or use of iterative reconstruction technique.            Radiologist location ID: [id number]             Real-time EKG Interpretation: 12 Lead EKG interpreted by me shows sinus tachycardic rhythm, rate of 120 bpm with normal intervals, normal axis deviation, good R wave progression, non-specific ST-T wave changes with ST elevation in  aVr and mild diffuse depressions                                       MEDICAL DECISION MAKING     Medications Administered in the ED   NS flush syringe (0 mL Intravenous Not Given 04/21/23 1800)   heparin 5,000 units/mL initial IV BOLUS (has no administration in time range)   heparin 25,000 units in 1/2 NS 250 mL infusion (has no administration in time range)   labetalol (TRANDATE) 5 mg/mL injection (20 mg Intravenous Given 04/21/23 1911)   iopamidol (ISOVUE-370) 76% infusion (has no administration in time range)   aspirin chewable tablet 324 mg (324 mg Oral Given 04/21/23 1912)   fentaNYL (SUBLIMAZE) 50 mcg/mL injection (100 mcg Intravenous Given 04/21/23 1912)       ED Course as of 04/21/23 2057   Sat Apr 21, 2023   1723 I initially evaluated the patient. The plan will be for labs, CTA, EKG     1835 TROPONIN-I(!!): 760        Medical Decision Making  The patient was evaluated for  chest pain and underwent lab, EKG and CTA analysis. The workup overall was remarkable for elevated troponin. CTA does not show PE. The patient had EKG changes of elevation in Avr with diffuse depression. Spoke to Dr. Ashok Cordia with cards who recommends nitro and heparin but no indication for cath.   The plan will be for admission.      Problems Addressed:  ESRD on hemodialysis (CMS [facility]): chronic illness or injury  NSTEMI (non-ST elevated myocardial infarction) (CMS [facility]): complicated acute illness or injury with systemic symptoms that poses a threat to life or bodily functions    Amount and/or Complexity of Data Reviewed  Labs: ordered. Decision-making details documented in ED Course.  Radiology: ordered. Decision-making details documented in ED Course.  ECG/medicine tests: ordered and independent interpretation performed. Decision-making details documented in ED Course.  Discussion of management or test interpretation with external provider(s): Consult with Conley Canal who agrees to admit  See MDM    Risk  OTC drugs.  Prescription drug management.  Parenteral controlled substances.  Drug therapy requiring intensive monitoring for toxicity.  Decision regarding hospitalization.                 Pre-Disposition Vitals:  Filed Vitals:    04/21/23 1915 04/21/23 1930 04/21/23 1945 04/21/23 2000   BP: (!) 182/92 (!) 126/104 (!) 127/58 (!) 150/89   Pulse: (!) 122 (!) 112 (!) 114 (!) 112   Resp: 14 16 (!) 22 (!) 28   Temp:       SpO2: 96% 97% 95% 98%                                               CLINICAL IMPRESSION     NSTEMI  END STAGE RENAL DISEASE on Hemodialysis                                               DISPOSITION PLAN     Admitted    Condition at Disposition: Tia Alert Fuller Plan,  SCRIBE, scribed for [employer]	[place]. Lundy Cozart, DO on 04/21/2023 at 5:23 PM     Documentation assistance provided for Searcy Miyoshi, [name] Balm, DO by scribe Annie Main. [place], SCRIBE, SCRIBE. Information recorded by the scribe was done at my direction and  has been reviewed and validated by me, Olanrewaju Osborn, [name] Balm, DO.

## 2023-04-21 NOTE — ED Triage Notes (Signed)
Pt here with c/o midsternal chest pain beginning today at 1530. Pt rates pain as 2/10, described as "something sitting on my chest." Pt also reporting nausea.

## 2023-04-21 NOTE — ED Nurses Note (Signed)
Report given to Carolyn, RN. Relinquishing care at this time.

## 2023-04-22 DIAGNOSIS — F419 Anxiety disorder, unspecified: Secondary | ICD-10-CM

## 2023-04-22 DIAGNOSIS — Z888 Allergy status to other drugs, medicaments and biological substances status: Secondary | ICD-10-CM

## 2023-04-22 DIAGNOSIS — E7800 Pure hypercholesterolemia, unspecified: Secondary | ICD-10-CM

## 2023-04-22 DIAGNOSIS — Z8616 Personal history of COVID-19: Secondary | ICD-10-CM

## 2023-04-22 DIAGNOSIS — R7989 Other specified abnormal findings of blood chemistry: Secondary | ICD-10-CM

## 2023-04-22 DIAGNOSIS — Z886 Allergy status to analgesic agent status: Secondary | ICD-10-CM

## 2023-04-22 DIAGNOSIS — R079 Chest pain, unspecified: Secondary | ICD-10-CM

## 2023-04-22 DIAGNOSIS — Z885 Allergy status to narcotic agent status: Secondary | ICD-10-CM

## 2023-04-22 LAB — PTT (PARTIAL THROMBOPLASTIN TIME)
APTT: 44 seconds — ABNORMAL HIGH (ref 24.0–36.5)
APTT: 54.5 seconds — ABNORMAL HIGH (ref 24.0–36.5)
APTT: 34.1 seconds (ref 24.0–36.5)

## 2023-04-22 LAB — BASIC METABOLIC PANEL
ANION GAP: 10 mmol/L (ref 4–13)
BUN/CREA RATIO: 4 — ABNORMAL LOW (ref 6–22)
BUN: 24 mg/dL (ref 8–25)
CALCIUM: 9.7 mg/dL (ref 8.6–10.2)
CHLORIDE: 100 mmol/L (ref 96–111)
CO2 TOTAL: 29 mmol/L (ref 22–30)
CREATININE: 5.53 mg/dL — ABNORMAL HIGH (ref 0.60–1.05)
ESTIMATED GFR - FEMALE: 9 mL/min/BSA — ABNORMAL LOW (ref >=60)
GLUCOSE: 100 mg/dL (ref 65–125)
POTASSIUM: 4.3 mmol/L (ref 3.5–5.1)
SODIUM: 139 mmol/L (ref 136–145)

## 2023-04-22 LAB — CBC WITH DIFF
BASOPHIL #: <0.1 10*3/uL (ref <=0.20)
BASOPHIL %: 0 %
EOSINOPHIL #: <0.1 10*3/uL (ref <=0.50)
EOSINOPHIL %: 1 %
HCT: 29.4 % — ABNORMAL LOW (ref 34.8–46.0)
HGB: 9.8 g/dL — ABNORMAL LOW (ref 11.5–16.0)
IMMATURE GRANULOCYTE #: <0.1 10*3/uL (ref <0.10)
IMMATURE GRANULOCYTE %: 0 % (ref 0.0–1.0)
LYMPHOCYTE #: 1.64 10*3/uL (ref 1.00–4.80)
LYMPHOCYTE %: 20 %
MCH: 34.4 pg — ABNORMAL HIGH (ref 26.0–32.0)
MCHC: 33.3 g/dL (ref 31.0–35.5)
MCV: 103.2 fL — ABNORMAL HIGH (ref 78.0–100.0)
MONOCYTE #: 0.69 10*3/uL (ref 0.20–1.10)
MONOCYTE %: 8 %
MPV: 10.1 fL (ref 8.7–12.5)
NEUTROPHIL #: 5.92 10*3/uL (ref 1.50–7.70)
NEUTROPHIL %: 71 %
PLATELETS: 183 10*3/uL (ref 150–400)
RBC: 2.85 10*6/uL — ABNORMAL LOW (ref 3.85–5.22)
RDW-CV: 15.8 % — ABNORMAL HIGH (ref 11.5–15.5)
WBC: 8.4 10*3/uL (ref 3.7–11.0)

## 2023-04-22 LAB — TROPONIN-I
TROPONIN I: 1138 ng/L (ref <=30)
TROPONIN I: 1210 ng/L (ref <=30)
TROPONIN I: 1371 ng/L (ref <=30)

## 2023-04-22 LAB — CBC
HCT: 28.2 % — ABNORMAL LOW (ref 34.8–46.0)
HGB: 9.5 g/dL — ABNORMAL LOW (ref 11.5–16.0)
MCH: 34.3 pg — ABNORMAL HIGH (ref 26.0–32.0)
MCHC: 33.7 g/dL (ref 31.0–35.5)
MCV: 101.8 fL — ABNORMAL HIGH (ref 78.0–100.0)
MPV: 10.2 fL (ref 8.7–12.5)
PLATELETS: 188 10*3/uL (ref 150–400)
RBC: 2.77 10*6/uL — ABNORMAL LOW (ref 3.85–5.22)
RDW-CV: 15.5 % (ref 11.5–15.5)
WBC: 7.5 10*3/uL (ref 3.7–11.0)

## 2023-04-22 LAB — LIPID PANEL
CHOL/HDL RATIO: 2.9
CHOLESTEROL: 107 mg/dL (ref 100–200)
HDL CHOL: 37 mg/dL — ABNORMAL LOW (ref >=50)
LDL CALC: 50 mg/dL (ref <100)
NON-HDL: 70 mg/dL (ref <=190)
TRIGLYCERIDES: 106 mg/dL (ref <150)
VLDL CALC: 15 mg/dL (ref <30)

## 2023-04-22 MED ORDER — HEPARIN (PORCINE) 5,000 UNITS/ML BOLUS FOR DOSE ADJUSTMENT
50.0000 [IU]/kg | Freq: Once | INTRAMUSCULAR | Status: AC
Start: 2023-04-22 — End: 2023-04-22
  Administered 2023-04-22: 4000 [IU] via INTRAVENOUS
  Filled 2023-04-22: qty 1

## 2023-04-22 NOTE — Consults (Signed)
[facility]      NEPHROLOGY CONSULT  Initial Consult Note      Service: [facility]  Requesting MD: Reino Kent  Date of Service:  04/22/2023  Encounter Start Date: 04/21/2023  Inpatient Admission Date: 04/21/2023    Information Obtained from: patient  Chief Complaint:  Chest pain/NSTEMI    HPI/Discussion:  END STAGE RENAL DISEASE, HTN, DM2, anemia secondary to CKD, secondary hyperparathyroidism. Pre--operative non-invasive testing OK earlier this year and she underwent 3.5 gland surgical parathyroidectomy at [facility] earlier last week. I saw her on HD last Friday and adjusted her bone mineral medications. Admitted yesterday for CP and elevated Troponins. Had CTA yesterday. Feels better today.  Labs show elevated T4 and suppressed TSH. Calcium initially elevated last evening and normal this morning.     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    Constipation     COVID 07/2019    Depression     Diabetes mellitus (CMS HCC)     Diabetes mellitus, type 2 (CMS HCC)     Diarrhea     Dyspnea on exertion     ESRD (end stage renal disease) (CMS HCC)     Essential hypertension     Exercise intolerance     Fatty liver     Fluid overload 08/21/21    Recent hospitalization for fluid overload, HD initiated 10/18/20    Headache(784.0)     Hemodialysis patient (CMS HCC)     HD Monday-Wednesday-Friday    History of kidney disease     CKD-Stage 5-HD initiated 10/18/20    History of nephrectomy 03/24/2020    L  nephrectomy    Hx of echocardiogram 10/18/2020    Technically difficult d/t pt. body habitus; Left vent. EF est. 55-60%; mildy calcified aortic vlave leaflets-especially right coroanry cusp; Small mobile density on aortic valve-no aortic valve stenosis or regurg; Concentric remodeling; mild MR; Trace AR; Trileaflet aortic vlave; Nl IVC size wiht <50% insp. collapse;       Hyperlipidemia     Hyperparathyroidism (CMS HCC)     Hyperthyroidism     Old records states hyperthyroidism, patient denies, states has hyperparathyroidism    Irritable bowel syndrome     Macular edema     Morbid obesity (CMS HCC)     Obesity     Panic attack     Peripheral edema     ankles/feet    Peripheral neuropathy     bilateral feet    Pneumonia 07/2019; 09/2020    COVID Pneumonia 07/2019; fluid overload/pneumonia with recent hosp. 09/2020    Shortness of breath     SOB after climbing 8 steps     Type 2 diabetes mellitus (CMS HCC)     Wears glasses     reading    White coat  hypertension        Past Surgical History:   Procedure Laterality Date    AV FISTULA PLACEMENT Left 08/11/2020    LUE brachio cephalic fistula creation    ECTOPIC PREGNANCY SURGERY  05/24/1998    HX CATARACT REMOVAL Bilateral 2016    HX CHOLECYSTECTOMY  09/10/96    HX HAND SURGERY Right 2017    Trigger finger release 3rd finger    HX OTHER Right 10/18/2020    tunneled dialysis catheter    HX PELVIC LAPAROSCOPY  05/24/98    Ectopic pregnancy    HX SHOULDER SURGERY Left 2017    Procedure for frozen shoulder    HX TUBAL LIGATION  09/10/2012    Filshie Clips    HYMENECTOMY  1996    KIDNEY SURGERY  10/24/17; 03/24/20    LAPAROSCOPIC NEPHRECTOMY, HAND ASSISTED Left 03/24/2020    Radical    LAPAROSCOPIC PARTIAL NEPHRECTOMY Left 10/24/2017    hand-assisted    RENAL BIOPSY, PERCUTANEOUS  03/08/2020    clear cell renal cell carcinoma           Inpatient Medications:  acetaminophen (TYLENOL) tablet, 1,000 mg, Oral, Q6H PRN  ALPRAZolam (XANAX) tablet, 0.25 mg, Oral, Q8H PRN  aspirin  chewable tablet 81 mg, 81 mg, Oral, Daily  atorvastatin (LIPITOR) tablet, 20 mg, Oral, QPM  B complex-vitamin C-folic acid (NEPHRO-VITE) tablet, 1 Tablet, Oral, NIGHTLY  cyclobenzaprine (FLEXERIL) tablet, 5 mg, Oral, 3x/day PRN  escitalopram (LEXAPRO) tablet, 10 mg, Oral, NIGHTLY  heparin 25,000 units in 1/2 NS 250 mL infusion, 12 Units/kg/hr (Adjusted), Intravenous, Continuous  labetalol (NORMODYNE) tablet, 200 mg, Oral, Q8HRS  losartan (COZAAR) tablet, 25 mg, Oral, Once per day on Sunday Tuesday Thursday Saturday  magnesium hydroxide (MILK OF MAGNESIA) 400mg  per 5mL oral liquid, 30 mL, Oral, HS PRN  melatonin tablet, 9 mg, Oral, NIGHTLY  nitroGLYCERIN (NITROSTAT) sublingual tablet, 0.4 mg, Sublingual, Q5 Min PRN  NS 250 mL flush bag, , Intravenous, Q1H PRN  NS flush syringe, 10 mL, Intravenous, Q8H  NS flush syringe, 10 mL, Intravenous, Q8HRS  NS flush syringe, 10 mL, Intravenous, Q1H PRN  ondansetron (ZOFRAN ODT) rapid dissolve tablet, 4 mg, Oral, Q6H PRN  prochlorperazine (COMPAZINE) 5 mg/mL injection, 10 mg, Intravenous, Q6H PRN  promethazine-dextromethorphan (PHENERGAN-DM) 6.25-15 mg per 5 mL oral liquid, 5 mL, Oral, Q6H PRN  sevelamer carbonate (RENVELA) tablet, 1,600 mg, Oral, 3x/day-Meals  traMADol (ULTRAM) tablet, 50 mg, Oral, Q6H PRN  zolpidem (AMBIEN) tablet, 5 mg, Oral, HS PRN    Allergies   Allergen Reactions    Gabapentin Itching    Hydrocodone     Ibuprofen     Vicodin [Hydrocodone-Acetaminophen] Itching       Diet Order:  DIET DIABETIC/ADA Carb Amount: 30 GM = 2 CARB CHOICES  Nutrition:    Orders Placed This Encounter   No orders of the following type(s) were placed in this encounter: Nourishments.     IV Fluids, Meds, and Drips: heparin, Last Rate: 14 Units/kg/hr (04/22/23 0734)        Family History  Family Medical History:       Problem Relation (Age of Onset)    Breast Cancer Paternal Aunt    Cancer Maternal Grandmother    Diabetes Father, Paternal Aunt    High Cholesterol Maternal Uncle, Mother     Hypertension (High Blood Pressure) Maternal Uncle, Father            Social History       ROS: Other  than ROS in the HPI, all other systems were negative.    Exam:  Temperature: 36.9 C (98.4 F)  Heart Rate: 98  BP (Non-Invasive): (!) 111/59  Respiratory Rate: 16  SpO2: 96 %  General: appears in good health, comfortable  Eyes: Conjunctiva clear.  HENT:Head atraumatic and normocephalic  Neck:  Parathyroidectomy scar  Lungs: Breathing nonlabored  Cardiovascular: regular rate and rhythm on the monitor  Abdomen: non-distended  Extremities: No cyanosis or deformity  Skin: Skin warm and dry  Neurologic: Grossly normal. Alert and oriented x3  Lymphatics:   Psychiatric: Normal    I/O:  I/O last 24 hours:    Intake/Output Summary (Last 24 hours) at 04/22/2023 0822  Last data filed at 04/22/2023 0700  Gross per 24 hour   Intake 109.58 ml   Output 200 ml   Net -90.42 ml     I/O current shift:  07/28 0700 - 07/28 1859  In: 24.83 [I.V.:24.83]  Out: -     Labs:  I have reviewed all lab results.    Imaging Studies:  N/A    Impression/Recommendations:   END STAGE RENAL DISEASE  HTN poorly controlled  Secondary hyperparathyroidism now controlled after surgical parathyroidectomy.   Anemia secondary to CKD  Hyperthyroidism.   Plan/Recommendations:  Dialysis not required today and will plan on next HD tomorrow here at [facility].   OK to proceed with cardiac catheterization if cardiology feels it is indicated.   Need to complete evaluation and treatment for hyperthyroidism.   Will defer HTN control for now to cardiology and attending given her NSTEMI and hyperthyroidism.   Mendel Corning, MD

## 2023-04-22 NOTE — Nurses Notes (Signed)
Patient is alert and oriented times four and is on room air.   Report giving to frank Rn. Herschell Dimes, RN

## 2023-04-22 NOTE — Consults (Signed)
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 female who presents with chest pain that started about 330 early morning of the day of admission-yesterday and she 1st thought it was just anxiety she took a Xanax and tramadol but it did not relieve it her (this usually helps her anxiety), daughter took her blood pressure (daughter is in [place] I believe) and the pressure was high.  Associated symptoms included nausea and dyspnea, she denies associated diaphoresis radiation except the left shoulder.  Rest did not help  She presented to the emergency room initial troponin was increased at 875.  She did have her dialysis on Friday.  Prior to the surgery she did not have any DOE, SOB at rest, PND orthopnea edema or cough    The ER physician discussed the case with Dr. Ashok Cordia and nitroglycerin and heparin was started no immediate indication for catheterization      Past Medical History:     Past Medical History:   Diagnosis Date    Anxiety     Arthritis     hips    Beta blocker prescribed for left ventricular systolic dysfunction     Labetalol     Cancer (CMS HCC) 2019, 2021    kidney, 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    Constipation     COVID 07/2019    Depression     Diabetes mellitus (CMS  HCC)     Diabetes mellitus, type 2 (CMS HCC)     Diarrhea     Dyspnea on exertion     ESRD (end stage renal disease) (CMS HCC)     Essential hypertension     Exercise intolerance     Fatty liver     Fluid overload 08/21/21    Recent hospitalization for fluid overload, HD initiated 10/18/20    Headache(784.0)     Hemodialysis patient (CMS HCC)     HD Monday-Wednesday-Friday    History of kidney disease     CKD-Stage 5-HD initiated 10/18/20    History of nephrectomy 03/24/2020    L nephrectomy    Hx of echocardiogram 10/18/2020    Technically difficult d/t pt. body habitus; Left vent. EF est. 55-60%; mildy calcified aortic vlave leaflets-especially right coroanry cusp; Small mobile density on aortic valve-no aortic valve stenosis or regurg; Concentric remodeling; mild MR; Trace AR; Trileaflet aortic vlave; Nl IVC size wiht <50% insp. collapse;       Hyperlipidemia     Hyperparathyroidism (CMS HCC)     Hyperthyroidism     Old records states hyperthyroidism, patient denies, states has hyperparathyroidism  Irritable bowel syndrome     Macular edema     Morbid obesity (CMS HCC)     Obesity     Panic attack     Peripheral edema     ankles/feet    Peripheral neuropathy     bilateral feet    Pneumonia 07/2019; 09/2020    COVID Pneumonia 07/2019; fluid overload/pneumonia with recent hosp. 09/2020    Shortness of breath     SOB after climbing 8 steps     Type 2 diabetes mellitus (CMS HCC)     Wears glasses     reading    White coat hypertension          Past Surgical History:     Past Surgical History:   Procedure Laterality Date    AV FISTULA PLACEMENT Left 08/11/2020    LUE brachio cephalic fistula creation    ECTOPIC PREGNANCY SURGERY  05/24/1998    HX CATARACT REMOVAL Bilateral 2016    HX CHOLECYSTECTOMY  09/10/96    HX HAND SURGERY Right 2017    Trigger finger release 3rd finger    HX OTHER Right 10/18/2020    tunneled dialysis catheter    HX PELVIC LAPAROSCOPY  05/24/98    Ectopic pregnancy    HX SHOULDER SURGERY Left 2017     Procedure for frozen shoulder    HX TUBAL LIGATION  09/10/2012    Filshie Clips    HYMENECTOMY  1996    KIDNEY SURGERY  10/24/17; 03/24/20    LAPAROSCOPIC NEPHRECTOMY, HAND ASSISTED Left 03/24/2020    Radical    LAPAROSCOPIC PARTIAL NEPHRECTOMY Left 10/24/2017    hand-assisted    RENAL BIOPSY, PERCUTANEOUS  03/08/2020    clear cell renal cell carcinoma         Allergies:     Allergies   Allergen Reactions    Gabapentin Itching    Hydrocodone     Ibuprofen     Vicodin [Hydrocodone-Acetaminophen] Itching     Medications:     Current Facility-Administered Medications   Medication Dose Route Frequency    acetaminophen (TYLENOL) tablet  1,000 mg Oral Q6H PRN    ALPRAZolam (XANAX) tablet  0.25 mg Oral Q8H PRN    aspirin chewable tablet 81 mg  81 mg Oral Daily    atorvastatin (LIPITOR) tablet  20 mg Oral QPM    B complex-vitamin C-folic acid (NEPHRO-VITE) tablet  1 Tablet Oral NIGHTLY    cyclobenzaprine (FLEXERIL) tablet  5 mg Oral 3x/day PRN    escitalopram (LEXAPRO) tablet  10 mg Oral NIGHTLY    heparin 25,000 units in 1/2 NS 250 mL infusion  12 Units/kg/hr (Adjusted) Intravenous Continuous    labetalol (NORMODYNE) tablet  200 mg Oral Q8HRS    losartan (COZAAR) tablet  25 mg Oral Once per day on Sunday Tuesday Thursday Saturday    magnesium hydroxide (MILK OF MAGNESIA) 400mg  per 5mL oral liquid  30 mL Oral HS PRN    melatonin tablet  9 mg Oral NIGHTLY    nitroGLYCERIN (NITROSTAT) sublingual tablet  0.4 mg Sublingual Q5 Min PRN    NS 250 mL flush bag   Intravenous Q1H PRN    NS flush syringe  10 mL Intravenous Q8H    NS flush syringe  10 mL Intravenous Q8HRS    NS flush syringe  10 mL Intravenous Q1H PRN    ondansetron (ZOFRAN ODT) rapid dissolve tablet  4 mg Oral Q6H PRN    prochlorperazine (COMPAZINE) 5 mg/mL  injection  10 mg Intravenous Q6H PRN    promethazine-dextromethorphan (PHENERGAN-DM) 6.25-15 mg per 5 mL oral liquid  5 mL Oral Q6H PRN    sevelamer carbonate (RENVELA) tablet  1,600 mg Oral 3x/day-Meals     traMADol (ULTRAM) tablet  50 mg Oral Q6H PRN    zolpidem (AMBIEN) tablet  5 mg Oral HS PRN     Prior to Admission medications    Medication Sig Start Date End Date Taking? Authorizing Provider   albuterol sulfate (PROVENTIL OR VENTOLIN OR PROAIR) 90 mcg/actuation Inhalation HFA Aerosol Inhaler Take 2 Puffs by inhalation Every 4 hours as needed  Patient not taking: Reported on 04/09/2023 08/22/19   Daryl Eastern, MD   ALPRAZolam Prudy Feeler) 0.25 mg Oral Tablet Take 1 Tablet (0.25 mg total) by mouth Once per day as needed for Anxiety 06/03/20   Provider, Historical   atorvastatin (LIPITOR) 20 mg Oral Tablet Take 1 Tablet (20 mg total) by mouth Every evening    Provider, Historical   cyclobenzaprine (FLEXERIL) 5 mg Oral Tablet Take 1 Tablet (5 mg total) by mouth Three times a day as needed for Muscle spasms    Provider, Historical   diatrizoate meg-diatrizoat sod 66-10 % Oral Solution Mix entire bottle of Gastrografin/Gastroview with 32 oz of your choice Non Carbonated beverage (Water, Juice, Gatorade, etc). Complete entire mixture 1 hour prior to exam arrival.  Patient not taking: Reported on 04/09/2023 11/02/22   Sheliah Plane, FNP-BC   escitalopram oxalate (LEXAPRO) 10 mg Oral Tablet Take 1 Tablet (10 mg total) by mouth Every night    Provider, Historical   Fish Oil-Omega-3 Fatty Acids 360-1,200 mg Oral Capsule Take 2 Capsules by mouth Twice daily    Provider, Historical   fluticasone (FLONASE) 50 mcg/actuation Nasal Spray, Suspension Administer 1 Spray into each nostril Once per day as needed (Allergy symptoms (nasal congestion/ runny nose))    Provider, Historical   iron sucrose (VENOFER) 50 mg iron/2.5 mL Intravenous Solution 2.5 mL (50 mg total) 08/22/21   Provider, Historical   labetaloL (NORMODYNE) 200 mg Oral Tablet Take 1 Tablet (200 mg total) by mouth Every 8 hours for 30 days Indications: Hold for SBP<120 or HR<55 05/26/21 10/13/21  Eloisa Northern, MD   loperamide (IMODIUM) 2 mg Oral Capsule Take 2 Capsules (4 mg total)  by mouth Every 6 hours as needed (Diarrhea)    Provider, Historical   loratadine (CLARITIN) 10 mg Oral Tablet Take 1 Tablet (10 mg total) by mouth Once per day as needed (Allergies)    Provider, Historical   losartan (COZAAR) 25 mg Oral Tablet Take 1 Tablet (25 mg total) by mouth Once a day for 30 days  Patient taking differently: Take 1 Tablet (25 mg total) by mouth Take in the evening on non-dialysis days (Sun/Tues/Thurs/Sat); does not take on dialysis days 03/30/20 04/09/23  Wandra Feinstein, MD   [facility] 7.5 mg/0.5 mL Subcutaneous Pen Injector Inject 0.5 mL (7.5 mg total) under the skin Every 7 days 01/23/23   Provider, Historical   omega-3-DHA-EPA-fish oil (FISH OIL) 1,000 mg (120 mg-180 mg) Oral Capsule Take by mouth 09/30/21   Provider, Historical   promethazine-dextromethorphan (PHENERGAN-DM) 6.25-15 mg/5 mL Oral Syrup TAKE 5 ML BY MOUTH 4 TIMES DAILY AS NEEDED FOR COUGH    Provider, Historical   sevelamer carbonate (RENVELA) 800 mg Oral Tablet Take 2 Tablets (1,600 mg total) by mouth 03/08/21   Provider, Historical   traMADoL (ULTRAM) 50 mg Oral Tablet Take 1 Tablet (50 mg total)  by mouth Every 6 hours as needed for Pain 10/28/20   Myrlene Broker, MD   TRIPHROCAPS 1 mg Oral Capsule Take 1 Capsule by mouth Every night    Provider, Historical   vitamin E 100 unit Oral Capsule Take 1 Capsule (100 Units total) by mouth Twice daily    Provider, Historical   vitamin E, dl, acetate, (AQUASOL E) 45 mg (100 unit) Oral Capsule Take by mouth  Patient not taking: Reported on 04/09/2023 09/30/21   Provider, Historical   calcitrioL (ROCALTROL) 0.25 mcg Oral Capsule Take 1 Capsule (0.25 mcg total) by mouth 08/03/20 04/21/23  Provider, Historical   cinacalcet (SENSIPAR) 60 mg Oral Tablet TAKE 1 TABLET BY MOUTH 3 TIMES A WEEK AS DIRECTED (MON, WED, FRI) AFTER DIALYSIS WITH AFTERNOON OR EVENING MEAL 01/25/23 04/21/23  Provider, Historical   JANUVIA 50 mg Oral Tablet Take 1 Tablet (50 mg total) by mouth Every morning  Patient not taking: Reported  on 04/09/2023 04/19/20 04/21/23  Provider, Historical     Family History:     Family Medical History:       Problem Relation (Age of Onset)    Breast Cancer Paternal Aunt    Cancer Maternal Grandmother    Diabetes Father, Paternal Aunt    High Cholesterol Maternal Uncle, Mother    Hypertension (High Blood Pressure) Maternal Uncle, Father            Social History:     Social History     Socioeconomic History    Marital status: Married     Spouse name: Not on file    Number of children: Not on file    Years of education: Not on file    Highest education level: Not on file   Occupational History    Not on file   Tobacco Use    Smoking status: Never    Smokeless tobacco: Never   Vaping Use    Vaping status: Never Used   Substance and Sexual Activity    Alcohol use: No    Drug use: No    Sexual activity: Yes     Partners: Male     Birth control/protection: Surgical   Other Topics Concern    Ability to Walk 1 Flight of Steps without SOB/CP Not Asked    Routine Exercise Not Asked    Ability to Walk 2 Flight of Steps without SOB/CP Not Asked    Unable to Ambulate Not Asked    Total Care Not Asked    Ability To Do Own ADL's Yes    Uses Walker Not Asked    Other Activity Level Yes     Comment: SOB after climbing approx. 8 steps, sedentary since recent hosp. for fluid overload and CKD    Uses Cane Not Asked   Social History Narrative    Not on file     Social Determinants of Health     Financial Resource Strain: Not on file   Transportation Needs: Not on file   Social Connections: Low Risk  (04/21/2023)    Social Connections     SDOH Social Isolation: 5 or more times a week   Intimate Partner Violence: Not on file   Housing Stability: Low Risk  (01/11/2023)    Received from [facility]    Housing Stability     Unstable Housing in the Last Year: Not on file     Review of Systems:     All pertinent ROS both positive and  negative as addressed in HPI    Physical Exam:     Vitals:    04/22/23 0559 04/22/23 0600 04/22/23 0700  04/22/23 0708   BP: (!) 111/55   (!) 111/59   Pulse: 97 98 (!) 102 98   Resp:  15 19 16    Temp:    36.9 C (98.4 F)   SpO2:  99% 94% 96%   Weight:       Height:       BMI:         Note she has been intermittently hypertensive-she associates this with her anxiety    Body mass index is 37.66 kg/m.    Gen: Awake, alert, and in no acute distress  Neck:  Minimal JVD  Cardiac:  Normal PMI, Normal S1 S2, RRR, No murmur, rubs, clicks, or gallops  Lungs: CTA bilaterally  Abdomen:+BS,  NT, no HJR, exam limited patient is sitting in the chair and protuberant abdomen  Vascular:   No carotid or abdominal bruits (exam limited),   2+ radial pulses  2+ PT pulses  Extremities: No edema    I/O's:   Intake and Output Summary (Last 24 hours) at Date Time    Intake/Output Summary (Last 24 hours) at 04/22/2023 0956  Last data filed at 04/22/2023 0700  Gross per 24 hour   Intake 109.58 ml   Output 200 ml   Net -90.42 ml       Labs Reviewed:   BMP:   Recent Labs     04/21/23  1739 04/22/23  0335   SODIUM 141 139   POTASSIUM 4.3 4.3   CO2 30 29   BUN 20 24   CREATININE 5.12* 5.53*   GLUCOSE 168* 100     Magnesium:   Recent Labs     04/21/23  1739   MAGNESIUM 2.1     CBC:   Recent Labs     04/21/23  1739 04/21/23  2347 04/22/23  0335   WBC 6.3 7.5 8.4   HGB 10.0* 9.5* 9.8*   HCT 29.7* 28.2* 29.4*   PLTCNT 183 188 183     Hepatic Function:   Recent Labs     04/21/23  1739   AST 31   ALT 10     Coags:   Recent Labs     04/22/23  0335   APTT 44.0*      Latest Reference Range & Units 04/21/23 21:19 04/21/23 23:47 04/22/23 03:35   TROPONIN-I <=30 ng/L 875 (HH) 1,138 (HH) 1,371 (HH)   (HH): Data is critically high      2023 on dialysis:  Note 02/22/2022 labs   Latest Reference Range & Units 02/22/22 12:56 02/22/22 16:18   TROPONIN-I <=30 ng/L 38 (HH) 44 (HH)   (HH): Data is critically high   Latest Reference Range & Units 02/22/22 12:56   BUN 8 - 25 mg/dL 46 (H)   CREATININE 1.02 - 1.05 mg/dL 7.25 (H)   GLUCOSE 65 - 125 mg/dL 366 (H)   ANION GAP 4  - 13 mmol/L 12   BUN/CREAT RATIO 6 - 22  7   ESTIMATED GLOMERULAR FILTRATION RATE >=60 mL/min/BSA 6 (L)   (H): Data is abnormally high  (L): Data is abnormally low  TSH:    Recent Labs     04/21/23  1739   TSH 0.022*     Lipids:   Recent Results (from the past 44034 hour(s))   LIPID PANEL    Collection  Time: 04/22/23  3:35 AM   Result Value    CHOLESTEROL  107    HDL CHOL 37 (L)    TRIGLYCERIDES 106    LDL CALC 50    VLDL CALC 15    NON-HDL 70    CHOL/HDL RATIO 2.9         Rads:     Results for orders placed or performed during the hospital encounter of 04/21/23 (from the past 72 hour(s))   CT ANGIO CHEST FOR PULMONARY EMBOLUS W IV CONTRAST     Status: None    Narrative    PROCEDURE DESCRIPTION: CT ANGIO CHEST FOR PULMONARY EMBOLUS W IV CONTRAST    CTA chest was performed to evaluate for pulmonary embolism with 3D/MIP reconstructions generated.    CLINICAL INDICATION: PE eval    COMPARISON: CT chest on pelvis dated November 07, 2022      FINDINGS: Thoracic kyphosis and degenerative changes of the spine are noted. There is abnormal subcutaneous air and fluid at the anterior neck extending from the thyroid to the subcutaneous tissues.    The left kidney is absent. The thoracic aorta shows no dissection or other acute abnormality. There is no pulmonary embolus. The heart is enlarged and there is coronary artery calcification. Suspect minor atelectasis at the lung bases. There is no pleural effusion.      Impression    1. No pulmonary embolus.  2. Abnormal fluid and air in the anterior neck extending from the thyroid fossa to the edge of the upper mediastinum. Superior most extent of this is not visualized.  The Epic database indicates that the patient has undergone subtotal parathyroidectomy on April 17, 2023. Findings in the CT likely reflect postoperative appearance. Infection or inflammation is not excluded based on imaging and clinical correlation recommended. Recommend follow-up to show resolution. If needed, CT  soft tissue neck with IV contrast can BE obtained for additional assessment.            The CT exam was performed using one or more of the following dose reduction techniques: Automated exposure control, adjustment of the mA and/or kV according to the patient's size, or use of iterative reconstruction technique.        Radiologist location ID: [id number]         Cardiovascular Workup:     ???  Stress Echo [facility])  08/2022 (results are only pertaining to TM stress test)    This stress test was normal.     A Bruce protocol stress test was performed. Overall, the patient's   exercise capacity was reduced for their age. The patient achieved the   target heart rate. The patient reported dyspnea, dizziness and chest pain   during the stress test. Onset of symptoms occurred at stage 1 of the   protocol. The test was stopped because the patient experienced fatigue and   shortness of breath. Heart rate demonstrated a normal response to stress.   Hypertensive blood pressure response. The patient's heart rate recovery   was normal. Patient was hypertensive prior to stress. Patient stated it   was due to anxiety.     No evidence of exercise-induced ischemia or arrhythmias on EKG     (The test was stopped because the patient experienced fatigue and shortness of breath.   Heart rate demonstrated a normal response to stress. Hypertensive blood pressure response. The patient's heart rate recovery was normal. Patient was hypertensive prior to stress. Patient stated it was due to anxiety)  09/14/2021 TTE  Normal left ventricular size.The left ventricular ejection fraction by visual assessment is estimated to be 60-65%.  Trace mitral regurgitation present.  Trace tricuspid regurgitation present.  There is mild aortic stenosis, mean PG 11 mmHg.    BNP  Lab Results   Component Value Date    BNP 356 (H) 10/16/2020    BNP 23 03/27/2020         Assessment and Recommendations:   [name] is a 57 y.o. female with:    Chest pain and  markedly increased troponin.  Chest pain is very possibly new onset angina.  Given significantly increasing troponin (she had only minimally elevated troponin on dialysis 02/11/2022 see above labs noted)    Recommend: Cardiac catheterization after a good 36-48 hours of heparin.  Use NTG drip for chest pain as tolerated  We will order LHC since no signs of HF thus far and 2022 echo with normal LVEF  Further recommendations pending the results of the cardiac catheterization-LHC    Hypertension-uncontrolled.  Patient states at least some values are associated with anxiety.  Probably needs adjustment as an outpatient.  Pending the results of the LHC, guideline directed therapy may be started and will help this    Normal LDL-on atorvastatin 20 mg q.d. and omega-3 fish oil and vitamin-E 100 units daily  . Continue these doses for now   Stay under 400 units of the vitamin-E      Thank you for consulting [place] and allowing [place] to participate in this patient's care! We will follow along while the patient is in the hospital.      Army Melia, MD

## 2023-04-22 NOTE — Progress Notes (Signed)
[facility]   Medicine Progress Note    [name]  Date of service: 04/22/2023  Date of Admission:  04/21/2023    Hospital Day:  LOS: 1 day     A/P:   Acute chest pain  Non ST-Elevation Myocardial Infarction (NSTEMI)     -Monitor on telemetry  -Goal-Directed Medical Therapy                -(ASA) 81 mg daily (324 mg chewable given in ED)                -(statin) atorvastatin 20 mg every evening as at home                -(ACEI/ARB) losartan 25 mg (already on this on non-dialysis days; continue)                -(b-blocker) labetalol 200 mg Q8H as at home                - IV heparin drip and follow protocol  --Cardiac diet  -Cardiology consult        End Stage Renal Disease on hemodialysis  Hyperparathyroidism with recent parathyroidectomy (04/17/23)  -HD days are MWF  -dialysis per nephrology. Plan for Monday noted. L     Hypertension - continue labetalol PO and losartan on non-dialysis days as at home; monitor BP trends    Diabetes Mellitus II, controlled - glucose 168 but last HA1c on 04/09/23 was 5.8;takes Mounjaro every 7 days at home; FSBS with SSIP; diabetic diet; continue diabetic teaching    Anxiety/Depression - continue Lexapro and alprazolam as at home          Subjective:     No chest pain today    Vital Signs:  Temp  Avg: 36.9 C (98.5 F)  Min: 36.5 C (97.7 F)  Max: 37.2 C (99 F)    Pulse  Avg: 105.9  Min: 93  Max: 130 BP  Min: 111/59  Max: 186/172   Resp  Avg: 18  Min: 13  Max: 30 SpO2  Avg: 96.8 %  Min: 92 %  Max: 100 %          Physical Exam:  Constitutional: no distress  Respiratory: Clear to auscultation bilaterally.   Cardiovascular: regular rate and rhythm  Gastrointestinal: Soft, non-tender, Bowel sounds normal       Today's Labs and medication Reviewed.         Harless Litten, MD

## 2023-04-22 NOTE — Care Plan (Signed)
Problem: Adult Inpatient Plan of Care  Goal: Plan of Care Review  Outcome: Ongoing (see interventions/notes)  Goal: Patient-Specific Goal (Individualized)  Outcome: Ongoing (see interventions/notes)  Goal: Absence of Hospital-Acquired Illness or Injury  Outcome: Ongoing (see interventions/notes)  Goal: Optimal Comfort and Wellbeing  Outcome: Ongoing (see interventions/notes)  Goal: Rounds/Family Conference  Outcome: Ongoing (see interventions/notes)     Problem: Pain Acute  Goal: Optimal Pain Control and Function  Outcome: Ongoing (see interventions/notes)     Problem: Chest Pain  Goal: Resolution of Chest Pain Symptoms  Outcome: Ongoing (see interventions/notes)

## 2023-04-22 NOTE — Care Plan (Signed)
Problem: Adult Inpatient Plan of Care  Goal: Plan of Care Review  Outcome: Ongoing (see interventions/notes)  Goal: Patient-Specific Goal (Individualized)  Outcome: Ongoing (see interventions/notes)  Flowsheets (Taken 04/22/2023 2000)  Patient would like to participate in bedside shift report: Yes  Individualized Care Needs: Denies  Anxieties, Fears or Concerns: Denies  Patient-Specific Goals (Include Timeframe): None Verbalized  Goal: Absence of Hospital-Acquired Illness or Injury  Outcome: Ongoing (see interventions/notes)  Intervention: Identify and Manage Fall Risk  Recent Flowsheet Documentation  Taken 04/22/2023 2200 by Theodoro Grist, RN  Safety Promotion/Fall Prevention:   activity supervised   fall prevention program maintained   muscle strengthening facilitated   nonskid shoes/slippers when out of bed   motion sensor pad activated   safety round/check completed  Taken 04/22/2023 2100 by Theodoro Grist, RN  Safety Promotion/Fall Prevention:   activity supervised   fall prevention program maintained   muscle strengthening facilitated   nonskid shoes/slippers when out of bed   motion sensor pad activated   safety round/check completed  Taken 04/22/2023 2000 by Theodoro Grist, RN  Safety Promotion/Fall Prevention:   activity supervised   fall prevention program maintained   muscle strengthening facilitated   nonskid shoes/slippers when out of bed   motion sensor pad activated   safety round/check completed  Intervention: Prevent Skin Injury  Recent Flowsheet Documentation  Taken 04/22/2023 2000 by Theodoro Grist, RN  Body Position: supine, head elevated  Skin Protection:   adhesive use limited   electrode sites changed   transparent dressing maintained   tubing/devices free from skin contact  Intervention: Prevent Infection  Recent Flowsheet Documentation  Taken 04/22/2023 2000 by Theodoro Grist, RN  Infection Prevention:   barrier precautions utilized   environmental surveillance performed    equipment surfaces disinfected   glycemic control managed   personal protective equipment utilized   promote handwashing   rest/sleep promoted   visitors restricted/screened   single patient room provided  Goal: Optimal Comfort and Wellbeing  Outcome: Ongoing (see interventions/notes)  Intervention: Provide Person-Centered Care  Recent Flowsheet Documentation  Taken 04/22/2023 2000 by Theodoro Grist, RN  Trust Relationship/Rapport:   care explained   choices provided   emotional support provided   empathic listening provided   questions answered   questions encouraged   reassurance provided   thoughts/feelings acknowledged  Goal: Rounds/Family Conference  Outcome: Ongoing (see interventions/notes)     Problem: Pain Acute  Goal: Optimal Pain Control and Function  Outcome: Ongoing (see interventions/notes)  Intervention: Prevent or Manage Pain  Recent Flowsheet Documentation  Taken 04/22/2023 2000 by Theodoro Grist, RN  Medication Review/Management: medications reviewed  Intervention: Optimize Psychosocial Wellbeing  Recent Flowsheet Documentation  Taken 04/22/2023 2000 by Theodoro Grist, RN  Diversional Activities:   television   smartphone     Problem: Chest Pain  Goal: Resolution of Chest Pain Symptoms  Outcome: Ongoing (see interventions/notes)

## 2023-04-23 ENCOUNTER — Encounter (HOSPITAL_COMMUNITY)
Admission: EM | Disposition: A | Discharge status: Home / Self Care | Payer: Self-pay | Source: Home / Self Care | Attending: Hospitalist

## 2023-04-23 DIAGNOSIS — I2510 Atherosclerotic heart disease of native coronary artery without angina pectoris: Secondary | ICD-10-CM

## 2023-04-23 DIAGNOSIS — I352 Nonrheumatic aortic (valve) stenosis with insufficiency: Secondary | ICD-10-CM

## 2023-04-23 DIAGNOSIS — R9431 Abnormal electrocardiogram [ECG] [EKG]: Secondary | ICD-10-CM

## 2023-04-23 DIAGNOSIS — I132 Hypertensive heart and chronic kidney disease with heart failure and with stage 5 chronic kidney disease, or end stage renal disease: Secondary | ICD-10-CM

## 2023-04-23 DIAGNOSIS — Z8249 Family history of ischemic heart disease and other diseases of the circulatory system: Secondary | ICD-10-CM

## 2023-04-23 DIAGNOSIS — Z6837 Body mass index (BMI) 37.0-37.9, adult: Secondary | ICD-10-CM

## 2023-04-23 DIAGNOSIS — E785 Hyperlipidemia, unspecified: Secondary | ICD-10-CM

## 2023-04-23 DIAGNOSIS — I5020 Unspecified systolic (congestive) heart failure: Secondary | ICD-10-CM

## 2023-04-23 DIAGNOSIS — E0590 Thyrotoxicosis, unspecified without thyrotoxic crisis or storm: Secondary | ICD-10-CM

## 2023-04-23 DIAGNOSIS — I5021 Acute systolic (congestive) heart failure: Secondary | ICD-10-CM

## 2023-04-23 DIAGNOSIS — R000 Tachycardia, unspecified: Secondary | ICD-10-CM

## 2023-04-23 LAB — CBC
HCT: 27.6 % — ABNORMAL LOW (ref 34.8–46.0)
HGB: 8.9 g/dL — ABNORMAL LOW (ref 11.5–16.0)
MCH: 34 pg — ABNORMAL HIGH (ref 26.0–32.0)
MCHC: 32.2 g/dL (ref 31.0–35.5)
MCV: 105.3 fL — ABNORMAL HIGH (ref 78.0–100.0)
MPV: 10.2 fL (ref 8.7–12.5)
PLATELETS: 181 10*3/uL (ref 150–400)
RBC: 2.62 10*6/uL — ABNORMAL LOW (ref 3.85–5.22)
RDW-CV: 15.7 % — ABNORMAL HIGH (ref 11.5–15.5)
WBC: 7.2 10*3/uL (ref 3.7–11.0)

## 2023-04-23 LAB — ECG 12-LEAD
Atrial Rate: 120 {beats}/min
Atrial Rate: 125 {beats}/min
Calculated P Axis: 48 degrees
Calculated P Axis: 42 degrees
Calculated R Axis: -16 degrees
Calculated R Axis: -21 degrees
Calculated T Axis: 36 degrees
Calculated T Axis: 120 degrees
PR Interval: 146 ms
PR Interval: 140 ms
QRS Duration: 82 ms
QRS Duration: 74 ms
QT Interval: 314 ms
QT Interval: 292 ms
QTC Calculation: 443 ms
QTC Calculation: 421 ms
Ventricular rate: 120 {beats}/min
Ventricular rate: 125 {beats}/min

## 2023-04-23 LAB — BASIC METABOLIC PANEL
ANION GAP: 12 mmol/L (ref 4–13)
BUN/CREA RATIO: 6 (ref 6–22)
BUN: 41 mg/dL — ABNORMAL HIGH (ref 8–25)
CALCIUM: 7.4 mg/dL — ABNORMAL LOW (ref 8.6–10.2)
CHLORIDE: 100 mmol/L (ref 96–111)
CO2 TOTAL: 27 mmol/L (ref 22–30)
CREATININE: 7.25 mg/dL — ABNORMAL HIGH (ref 0.60–1.05)
ESTIMATED GFR - FEMALE: 6 mL/min/BSA — ABNORMAL LOW (ref >=60)
GLUCOSE: 103 mg/dL (ref 65–125)
POTASSIUM: 4.6 mmol/L (ref 3.5–5.1)
SODIUM: 139 mmol/L (ref 136–145)

## 2023-04-23 LAB — INVASIVE CARDIOLOGY PROCEDURE: Cath EF Quantitative: 30 %

## 2023-04-23 LAB — PTT (PARTIAL THROMBOPLASTIN TIME)
APTT: 64.9 seconds — ABNORMAL HIGH (ref 24.0–36.5)
APTT: 78.8 seconds — ABNORMAL HIGH (ref 24.0–36.5)
APTT: 60.8 seconds — ABNORMAL HIGH (ref 24.0–36.5)

## 2023-04-23 SURGERY — CORONARY ANGIOGRAPHY W/LEFT HEART CATH W/WO LVG

## 2023-04-23 MED ORDER — FENTANYL (PF) 50 MCG/ML INJECTION SOLUTION
Freq: Once | INTRAMUSCULAR | Status: DC | PRN
Start: 2023-04-23 — End: 2023-04-23
  Administered 2023-04-23: 50 ug via INTRAVENOUS

## 2023-04-23 MED ORDER — HEPARIN (PORCINE) 1,000 UNIT/ML INJECTION SOLUTION
Freq: Once | INTRAMUSCULAR | Status: DC | PRN
Start: 2023-04-23 — End: 2023-04-23
  Administered 2023-04-23: 2000 [IU] via INTRA_ARTERIAL

## 2023-04-23 MED ORDER — MIDAZOLAM 1 MG/ML INJECTION SOLUTION
Freq: Once | INTRAMUSCULAR | Status: DC | PRN
Start: 2023-04-23 — End: 2023-04-23
  Administered 2023-04-23: 1 mg via INTRAVENOUS

## 2023-04-23 MED ORDER — ATORVASTATIN 40 MG TABLET
40.0000 mg | ORAL_TABLET | Freq: Every evening | ORAL | Status: DC
Start: 2023-04-23 — End: 2023-04-25
  Administered 2023-04-23 – 2023-04-24 (×2): 40 mg via ORAL
  Filled 2023-04-23 (×2): qty 1

## 2023-04-23 MED ORDER — LIDOCAINE HCL 10 MG/ML (1 %) INJECTION SOLUTION
INTRAMUSCULAR | Status: AC
Start: 2023-04-23 — End: 2023-04-23
  Filled 2023-04-23: qty 10

## 2023-04-23 MED ORDER — EPOETIN ALFA-EPBX 10,000 UNIT/ML INJECTION SOLUTION
10000.0000 [IU] | INTRAMUSCULAR | Status: DC
Start: 2023-04-23 — End: 2023-04-23

## 2023-04-23 MED ORDER — MIDAZOLAM 1 MG/ML INJECTION WRAPPER
INTRAMUSCULAR | Status: AC
Start: 2023-04-23 — End: 2023-04-23
  Filled 2023-04-23: qty 2

## 2023-04-23 MED ORDER — BIVALIRUDIN 5 MG/ML BOLUS FROM INFUSION
Freq: Once | INTRAVENOUS | Status: DC | PRN
Start: 2023-04-23 — End: 2023-04-23
  Administered 2023-04-23: 81.75 mg via INTRAVENOUS

## 2023-04-23 MED ORDER — DILTIAZEM 5 MG/ML INTRAVENOUS SOLUTION - CHI CATH LAB
Freq: Once | INTRAVENOUS | Status: DC | PRN
Start: 2023-04-23 — End: 2023-04-23
  Administered 2023-04-23: 2 mg via INTRA_ARTERIAL

## 2023-04-23 MED ORDER — NITROGLYCERIN 50 MCG/ML INJECTION
INJECTION | INTRAVENOUS | Status: AC
Start: 2023-04-23 — End: 2023-04-23
  Filled 2023-04-23: qty 30

## 2023-04-23 MED ORDER — LIDOCAINE HCL 10 MG/ML (1 %) INJECTION SOLUTION
Freq: Once | INTRAMUSCULAR | Status: DC | PRN
Start: 2023-04-23 — End: 2023-04-23
  Administered 2023-04-23 (×2): 1 mL via INTRADERMAL

## 2023-04-23 MED ORDER — HEPARIN (PORCINE) (PF) 1,000 UNIT/500 ML IN 0.9 % SODIUM CHLORIDE IV
INTRAVENOUS | Status: AC | PRN
Start: 2023-04-23 — End: 2023-04-23
  Administered 2023-04-23: 3 mL/h via INTRA_ARTERIAL

## 2023-04-23 MED ORDER — IODIXANOL 320 MG IODINE/ML INTRAVENOUS SOLUTION
INTRAVENOUS | Status: AC
Start: 2023-04-23 — End: 2023-04-23
  Filled 2023-04-23: qty 200

## 2023-04-23 MED ORDER — EPOETIN ALFA-EPBX 10,000 UNIT/ML INJECTION SOLUTION
10000.0000 [IU] | INTRAMUSCULAR | Status: AC
Start: 2023-04-24 — End: 2023-04-24
  Administered 2023-04-24: 10000 [IU] via INTRAVENOUS
  Filled 2023-04-23: qty 1

## 2023-04-23 MED ORDER — BIVALIRUDIN 250 MG/50 ML (5 MG/ML) INTRAVENOUS SOLUTION
INTRAVENOUS | Status: AC
Start: 2023-04-23 — End: 2023-04-23
  Filled 2023-04-23: qty 50

## 2023-04-23 MED ORDER — SODIUM CHLORIDE 0.9 % INTRAVENOUS SOLUTION
INTRAVENOUS | Status: AC | PRN
Start: 2023-04-23 — End: 2023-04-23
  Administered 2023-04-23: 100 mL/h via INTRAVENOUS

## 2023-04-23 MED ORDER — NITROGLYCERIN 50 MCG/ML INJECTION
INJECTION | Freq: Once | INTRAVENOUS | Status: DC | PRN
Start: 2023-04-23 — End: 2023-04-23
  Administered 2023-04-23: 500 ug via INTRACORONARY
  Administered 2023-04-23: 200 ug via INTRA_ARTERIAL
  Administered 2023-04-23: 400 ug via INTRACORONARY

## 2023-04-23 MED ORDER — HEPARIN (PORCINE) 5,000 UNIT/ML INJECTION SOLUTION
INTRAMUSCULAR | Status: AC
Start: 2023-04-23 — End: 2023-04-23
  Filled 2023-04-23: qty 1

## 2023-04-23 MED ORDER — HEPARIN (PORCINE) (PF) 2,000 UNIT/1,000 ML IN 0.9 % SODIUM CHLORIDE IV
Freq: Once | INTRAVENOUS | Status: DC | PRN
Start: 2023-04-23 — End: 2023-04-23
  Administered 2023-04-23: 2000 [IU]

## 2023-04-23 MED ORDER — BIVALIRUDIN 250 MG/50 ML (5 MG/ML) INTRAVENOUS SOLUTION
INTRAVENOUS | Status: AC | PRN
Start: 2023-04-23 — End: 2023-04-23
  Administered 2023-04-23: .25 mg/kg/h via INTRAVENOUS

## 2023-04-23 MED ORDER — FENTANYL (PF) 50 MCG/ML INJECTION SOLUTION
INTRAMUSCULAR | Status: AC
Start: 2023-04-23 — End: 2023-04-23
  Filled 2023-04-23: qty 2

## 2023-04-23 MED ORDER — TICAGRELOR 90 MG TABLET
ORAL_TABLET | Freq: Once | ORAL | Status: DC | PRN
Start: 2023-04-23 — End: 2023-04-23
  Administered 2023-04-23: 180 mg via ORAL

## 2023-04-23 MED ORDER — HEPARIN (PORCINE) (PF) 1,000 UNIT/500 ML IN 0.9 % SODIUM CHLORIDE IV
INTRAVENOUS | Status: AC
Start: 2023-04-23 — End: 2023-04-23
  Filled 2023-04-23: qty 500

## 2023-04-23 MED ORDER — TICAGRELOR 90 MG TABLET
ORAL_TABLET | ORAL | Status: AC
Start: 2023-04-23 — End: 2023-04-23
  Filled 2023-04-23: qty 2

## 2023-04-23 MED ORDER — DILTIAZEM 5 MG/ML INTRAVENOUS SOLUTION
INTRAVENOUS | Status: AC
Start: 2023-04-23 — End: 2023-04-23
  Filled 2023-04-23: qty 5

## 2023-04-23 MED ORDER — ASPIRIN 81 MG CHEWABLE TABLET
81.0000 mg | CHEWABLE_TABLET | Freq: Every day | ORAL | Status: DC
Start: 2023-04-24 — End: 2023-04-24
  Administered 2023-04-24: 0 mg via ORAL

## 2023-04-23 MED ORDER — IODIXANOL 320 MG IODINE/ML INTRAVENOUS SOLUTION
Freq: Once | INTRAVENOUS | Status: DC | PRN
Start: 2023-04-23 — End: 2023-04-23
  Administered 2023-04-23: 142 mL via INTRACORONARY

## 2023-04-23 MED ORDER — TICAGRELOR 90 MG TABLET
90.0000 mg | ORAL_TABLET | Freq: Two times a day (BID) | ORAL | Status: DC
Start: 2023-04-24 — End: 2023-04-25
  Administered 2023-04-24 – 2023-04-25 (×3): 90 mg via ORAL
  Filled 2023-04-23 (×3): qty 1

## 2023-04-23 MED ORDER — HEPARIN (PORCINE) (PF) 2,000 UNIT/1,000 ML IN 0.9 % SODIUM CHLORIDE IV
INTRAVENOUS | Status: AC
Start: 2023-04-23 — End: 2023-04-23
  Filled 2023-04-23: qty 1000

## 2023-04-23 SURGICAL SUPPLY — 18 items
ANGIO NAMIC PROTECT 72IN WASTE COLLECT FEMALE FIT STRAP CLIP CLS FLUID SYSTEM STRL LF (IV TUBING & ACCESSORIES) ×2 IMPLANT
CATH ANGIO 5FR RADIAL TIG 4 CURVE 100CM OPTITORQUE LRG LUM SH 2 BRD SFT TIP COR SS NYL POLYUR (CATHETERS) ×2 IMPLANT
CATH ANGIO EXPO TRILON PIGTAIL CRVE 100CM 5FR PERIPH FULL LEN WIRE BRD RBST SHFT (CATHETERS) ×2
CATH EMERGE MNRL WH 2.5MM 20MM 144CM 2 LUM ULTRA LOW PROF RADOPQ BAL DIL OPTILEAP X ZGLIDE ACPT 6FR (BALLOON) ×2 IMPLANT
CATH GD 6FR 100CM EBU3.5 CURVE LNCHR LRG LUM RADOPQ FLXB DIST SEG INNER JKT COR NYL PLMR STRL LF (CATHETERS) ×2 IMPLANT
CONV USE 338564 - PACK SURG ANGIO PRX STRL DISP LF (CUSTOM TRAYS & PACK) ×2 IMPLANT
DEVICE COMPRESS TR BAND 24CM 2 BAL TRNSPR ADJ STRAP REG RADIAL ART (MED SURG SUPPLIES) ×2 IMPLANT
DUPE USE ITEM 69223 - CATH ANGIO EXPO TRILON PIGTAIL CRVE 100CM 5FR PERIPH FULL LEN WIRE BRD RBST SHFT (CATHETERS) ×2 IMPLANT
GW .035IN 260CM ANGIO PTFE FIX COR STD BODY STN FNSH 2 END 3MM J CURVE STRL (WIRE) ×2 IMPLANT
GW RUNTHROUGH .36MM 3CM 180CM RADOPQ X FLPY VAS STRL LF  DISP PTCA (WIRE) ×2 IMPLANT
KIT ANGIO ANGIOTOUCH HAND CONTROLLR STOPCOCK TUBE STRL DISP (IV TUBING & ACCESSORIES) ×2 IMPLANT
KIT INTROD 10CM 6FR 21GA GLIDESHEATH SLNDR .021IN 35MM FLX STR SS HDRPH SHEATH DIL ANT  WL PNCT (INTRODUCER) ×2 IMPLANT
KIT MANIFLD SAL CNRST PORT SPIKE TUBE HAND SYRG PRESS TRANSDUC CART ACIST CVI DISP (IV TUBING & ACCESSORIES) ×2 IMPLANT
KIT MICROINTRO VSI 40CM 7CM .018IN 4FR 21GA NITINOL SS ECHGN NEEDLE MNDRL STIFFEN DIL SFT TIP (WIRE) ×2 IMPLANT
KIT SYRG AUTOMATE MU A2000 REUSE (MED SURG SUPPLIES) ×2 IMPLANT
PACK SURG ANGIO PRX STRL DISP LF (CUSTOM TRAYS & PACK) ×1
SYRINGE ANGIO 30ML INFLAT LF MNRCH (CONTRAST) ×2 IMPLANT
VALVE HMSTS 8FR WATCHDOG CX SLT SEAL LOCK UNLOCK SMB (VASCULAR) ×2 IMPLANT

## 2023-04-23 NOTE — Nurses Notes (Signed)
Pt went down to cath lab at 1615

## 2023-04-23 NOTE — Care Management Notes (Addendum)
Met with pt for d/c planning, in the presence of her brother and her daughter Diannia Ruder.  Affirmed address of record to be correct.  Physical location is [address] not [employer].  Home is a one story house with no outside steps to navigate.  Residents, besides the pt are her husband (not currently working) several adult kids (all working). 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  Independent.  Drives.  No use of any DME.  PCP: Dr. Farrel Conners, [facility].  Rx: [facility] in [place].  No h/o HH or SNF.  Stated if a decisionmaker was necessary, it is to be [place].  Nursing was preparing pt for transport to cath lab, soon after I arrived.    I did call Diannia Ruder a bit ago to obtain pt's OP HD schedule.  Stated when pt was working, her chair time was 1:30pm. Her days are MWF.  In the summer, she goes at various times, but usually earlier in the day, anytime from 6a to 10-11am.  Center : [facility] on [address].  Dr. Webb Silversmith is her nephrologist.    Diannia Ruder thinks her mom has completed a MPOA, stating her dad could also make decisions if necessary.     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

## 2023-04-23 NOTE — Consults (Addendum)
[facility]    [facility]  Interventional cardiology Consultation Note    Date Time: 04/23/2023 18:45  Patient Name: [name]  MRN#: [id number]  DOB: 22-Apr-1966  Consulting Physician: Harless Litten, MD    Reason for Consult:   The patient was seen at the request of Harless Litten, MD for the evaluation of NSTEMI.     History:   [name] is a 57 y.o. female who presents with chest pain occurred this past Saturday, 04/21/23, while she was sitting in her recliner.  She states that it is left-sided and described it as a stabbing sensation.  She reports she would have rated it 10/10.  She denies any pain currently.  Pain did not radiate but does endorse shortness of breath and nausea that occurred while having the chest pain.  She stated that it did worsen with exertion.  She reports different than other chest pressure she has had in the past which her Xanax will usually helps take care of.  She felt it had just been anxiety but after she had taken her Xanax and it had not helped, she presented to the ER.  She was scheduled for HD today, which was pushed to tomorrow given scheduled catheterization for today.  Her troponins peaked at 1371.  She reports having a family history of heart disease as her father passed away this past Jan 16, 2023 from a heart attack.     She underwent heart catheterization on July 29th 2024, with findings of a critically diseased proximal LAD, for which she had angioplasty and stenting.  Her LV systolic and diastolic function were severely impaired with an LVEDP of 30 and a left ventricular ejection fraction of 30% (there was severe hypokinesis of the anterior wall).  There is moderate to severe aortic stenosis with a peak to peak gradient of 38 mmHg.    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 body habitus; Left vent. EF est. 55-60%; mildy calcified aortic vlave leaflets-especially right coroanry cusp; Small mobile density on aortic valve-no aortic valve stenosis or regurg; Concentric remodeling; mild MR; Trace AR; Trileaflet aortic vlave; Nl IVC size wiht <50% insp. collapse;       Hyperlipidemia     Hyperparathyroidism (CMS HCC)     Hyperthyroidism     Old records states hyperthyroidism, patient denies, states has hyperparathyroidism    Irritable bowel syndrome     Macular edema     Morbid obesity (CMS HCC)     Obesity     Panic attack     Peripheral edema     ankles/feet    Peripheral neuropathy     bilateral feet    Pneumonia 07/2019; 09/2020    COVID Pneumonia 07/2019; fluid overload/pneumonia with recent hosp. 09/2020    Shortness of breath     SOB after climbing 8 steps     Type 2 diabetes mellitus (CMS HCC)     Wears glasses     reading    White coat hypertension            Past Surgical History:     Past Surgical  History:   Procedure Laterality Date    AV FISTULA PLACEMENT Left 08/11/2020    LUE brachio cephalic fistula creation    ECTOPIC PREGNANCY SURGERY  05/24/1998    HX CATARACT REMOVAL Bilateral 2016    HX CHOLECYSTECTOMY  09/10/96    HX HAND SURGERY Right 2017    Trigger finger release 3rd finger    HX OTHER Right 10/18/2020    tunneled dialysis catheter    HX PELVIC LAPAROSCOPY  05/24/98    Ectopic pregnancy    HX SHOULDER SURGERY Left 2017    Procedure for frozen shoulder    HX TUBAL LIGATION  09/10/2012    Filshie Clips    HYMENECTOMY  1996    KIDNEY SURGERY  10/24/17; 03/24/20    LAPAROSCOPIC NEPHRECTOMY, HAND ASSISTED Left 03/24/2020    Radical    LAPAROSCOPIC PARTIAL NEPHRECTOMY Left 10/24/2017    hand-assisted    RENAL BIOPSY, PERCUTANEOUS  03/08/2020    clear cell renal cell carcinoma           Allergies:     Allergies   Allergen Reactions    Gabapentin Itching    Hydrocodone     Ibuprofen     Vicodin [Hydrocodone-Acetaminophen] Itching       Medications:     Current Facility-Administered Medications   Medication Dose Route Frequency    acetaminophen (TYLENOL) tablet  1,000 mg Oral Q6H PRN    ALPRAZolam (XANAX) tablet  0.25 mg Oral Q8H PRN    aspirin chewable tablet 81 mg  81 mg Oral Daily    atorvastatin (LIPITOR) tablet  20 mg Oral QPM    B complex-vitamin C-folic acid (NEPHRO-VITE) tablet  1 Tablet Oral NIGHTLY    bivalirudin (ANGIOMAX) 250 mg in 50 mL premix infusion    One-Step Med: Continuous PRN    bivalirudin (ANGIOMAX) 5 mg/mL bolus from infusion    One-Step Med: Once PRN    cyclobenzaprine (FLEXERIL) tablet  5 mg Oral 3x/day PRN    dilTIAZem (CARDIZEM) 5 mg/mL injection    One-Step Med: Once PRN    epoetin alfa-epbx (RETACRIT) 10,000 units/mL injection  10,000 Units Intravenous Give in Dialysis    escitalopram (LEXAPRO) tablet  10 mg Oral NIGHTLY    fentaNYL (SUBLIMAZE)  50 mcg/mL injection    One-Step Med: Once PRN    heparin 1,000 unit/mL injection    One-Step Med: Once PRN    heparin 1,000 units in NS  500 mL premix flush    One-Step Med: Continuous PRN    heparin 2,000 units in NS 1 L irrigation    One-Step Med: Once PRN    heparin 25,000 units in 1/2 NS 250 mL infusion  12 Units/kg/hr (Adjusted) Intravenous Continuous    iodixanol (VISIPAQUE 320) infusion    One-Step Med: Once PRN    labetalol (NORMODYNE) tablet  200 mg Oral Q8HRS    lidocaine 1% injection    One-Step Med: Once PRN    losartan (COZAAR) tablet  25 mg Oral Once per day on Sunday Tuesday Thursday Saturday    magnesium hydroxide (MILK OF MAGNESIA) 400mg  per 5mL oral liquid  30 mL Oral HS PRN    melatonin tablet  9 mg Oral NIGHTLY    midazolam (VERSED) 1 mg/mL injection    One-Step Med: Once PRN    nitroGLYCERIN (NITROSTAT) sublingual tablet  0.4 mg Sublingual Q5 Min PRN    nitroGLYCERIN 50 mcg/mL injection    One-Step Med: Once PRN    NS 250 mL flush bag   Intravenous Q1H PRN    NS flush syringe  10 mL Intravenous Q8H    NS flush syringe  10 mL Intravenous Q8HRS    NS flush syringe  10 mL Intravenous Q1H PRN    NS premix infusion    One-Step Med: Continuous PRN    ondansetron (ZOFRAN ODT) rapid dissolve tablet  4 mg Oral Q6H PRN    prochlorperazine (COMPAZINE) 5 mg/mL injection  10 mg Intravenous Q6H PRN    promethazine-dextromethorphan (PHENERGAN-DM) 6.25-15 mg per 5 mL oral liquid  5 mL Oral Q6H PRN    sevelamer carbonate (RENVELA) tablet  1,600 mg Oral 3x/day-Meals    ticagrelor (BRILINTA) tablet    One-Step Med: Once PRN    traMADol (ULTRAM) tablet  50 mg Oral Q6H PRN    zolpidem (AMBIEN) tablet  5 mg Oral HS PRN     Prior to Admission medications    Medication Sig Start Date End Date Taking? Authorizing Provider   albuterol sulfate (PROVENTIL OR VENTOLIN OR PROAIR) 90 mcg/actuation Inhalation HFA Aerosol Inhaler Take 2 Puffs by inhalation Every 4 hours as needed  Patient not taking: Reported on 04/09/2023 08/22/19   Daryl Eastern, MD   ALPRAZolam Prudy Feeler) 0.25 mg Oral Tablet Take 1 Tablet (0.25 mg total) by mouth Once per day as needed for  Anxiety 06/03/20   Provider, Historical   atorvastatin (LIPITOR) 20 mg Oral Tablet Take 1 Tablet (20 mg total) by mouth Every evening    Provider, Historical   cyclobenzaprine (FLEXERIL) 5 mg Oral Tablet Take 1 Tablet (5 mg total) by mouth Three times a day as needed for Muscle spasms    Provider, Historical   diatrizoate meg-diatrizoat sod 66-10 % Oral Solution Mix entire bottle of Gastrografin/Gastroview with 32 oz of your choice Non Carbonated beverage (Water, Juice, Gatorade, etc). Complete entire mixture 1 hour prior to exam arrival.  Patient not taking: Reported on 04/09/2023 11/02/22   Sheliah Plane, FNP-BC   escitalopram oxalate (LEXAPRO) 10 mg Oral Tablet Take 1 Tablet (10 mg total) by mouth Every night    Provider, Historical   Fish Oil-Omega-3 Fatty Acids 360-1,200 mg Oral Capsule Take 2 Capsules by mouth Twice daily    Provider, Historical  fluticasone (FLONASE) 50 mcg/actuation Nasal Spray, Suspension Administer 1 Spray into each nostril Once per day as needed (Allergy symptoms (nasal congestion/ runny nose))    Provider, Historical   iron sucrose (VENOFER) 50 mg iron/2.5 mL Intravenous Solution 2.5 mL (50 mg total) 08/22/21   Provider, Historical   labetaloL (NORMODYNE) 200 mg Oral Tablet Take 1 Tablet (200 mg total) by mouth Every 8 hours for 30 days Indications: Hold for SBP<120 or HR<55 05/26/21 10/13/21  Eloisa Northern, MD   loperamide (IMODIUM) 2 mg Oral Capsule Take 2 Capsules (4 mg total) by mouth Every 6 hours as needed (Diarrhea)    Provider, Historical   loratadine (CLARITIN) 10 mg Oral Tablet Take 1 Tablet (10 mg total) by mouth Once per day as needed (Allergies)    Provider, Historical   losartan (COZAAR) 25 mg Oral Tablet Take 1 Tablet (25 mg total) by mouth Once a day for 30 days  Patient taking differently: Take 1 Tablet (25 mg total) by mouth Take in the evening on non-dialysis days (Sun/Tues/Thurs/Sat); does not take on dialysis days 03/30/20 04/09/23  Wandra Feinstein, MD   [facility] 7.5 mg/0.5 mL  Subcutaneous Pen Injector Inject 0.5 mL (7.5 mg total) under the skin Every 7 days 01/23/23   Provider, Historical   omega-3-DHA-EPA-fish oil (FISH OIL) 1,000 mg (120 mg-180 mg) Oral Capsule Take by mouth 09/30/21   Provider, Historical   promethazine-dextromethorphan (PHENERGAN-DM) 6.25-15 mg/5 mL Oral Syrup TAKE 5 ML BY MOUTH 4 TIMES DAILY AS NEEDED FOR COUGH    Provider, Historical   sevelamer carbonate (RENVELA) 800 mg Oral Tablet Take 2 Tablets (1,600 mg total) by mouth 03/08/21   Provider, Historical   traMADoL (ULTRAM) 50 mg Oral Tablet Take 1 Tablet (50 mg total) by mouth Every 6 hours as needed for Pain 10/28/20   Myrlene Broker, MD   TRIPHROCAPS 1 mg Oral Capsule Take 1 Capsule by mouth Every night    Provider, Historical   vitamin E 100 unit Oral Capsule Take 1 Capsule (100 Units total) by mouth Twice daily    Provider, Historical   vitamin E, dl, acetate, (AQUASOL E) 45 mg (100 unit) Oral Capsule Take by mouth  Patient not taking: Reported on 04/09/2023 09/30/21   Provider, Historical   calcitrioL (ROCALTROL) 0.25 mcg Oral Capsule Take 1 Capsule (0.25 mcg total) by mouth 08/03/20 04/21/23  Provider, Historical   cinacalcet (SENSIPAR) 60 mg Oral Tablet TAKE 1 TABLET BY MOUTH 3 TIMES A WEEK AS DIRECTED (MON, WED, FRI) AFTER DIALYSIS WITH AFTERNOON OR EVENING MEAL 01/25/23 04/21/23  Provider, Historical   JANUVIA 50 mg Oral Tablet Take 1 Tablet (50 mg total) by mouth Every morning  Patient not taking: Reported on 04/09/2023 04/19/20 04/21/23  Provider, Historical       Family History:     Family Medical History:       Problem Relation (Age of Onset)    Breast Cancer Paternal Aunt    Cancer Maternal Grandmother    Diabetes Father, Paternal Aunt    High Cholesterol Maternal Uncle, Mother    Hypertension (High Blood Pressure) Maternal Uncle, Father              Social History:     Social History     Socioeconomic History    Marital status: Married     Spouse name: Not on file    Number of children: Not on file    Years of  education: Not on file  Highest education level: Not on file   Occupational History    Not on file   Tobacco Use    Smoking status: Never    Smokeless tobacco: Never   Vaping Use    Vaping status: Never Used   Substance and Sexual Activity    Alcohol use: No    Drug use: No    Sexual activity: Yes     Partners: Male     Birth control/protection: Surgical   Other Topics Concern    Ability to Walk 1 Flight of Steps without SOB/CP Not Asked    Routine Exercise Not Asked    Ability to Walk 2 Flight of Steps without SOB/CP Not Asked    Unable to Ambulate Not Asked    Total Care Not Asked    Ability To Do Own ADL's Yes    Uses Walker Not Asked    Other Activity Level Yes     Comment: SOB after climbing approx. 8 steps, sedentary since recent hosp. for fluid overload and CKD    Uses Cane Not Asked   Social History Narrative    Not on file     Social Determinants of Health     Financial Resource Strain: Not on file   Transportation Needs: Not on file   Social Connections: Low Risk  (04/21/2023)    Social Connections     SDOH Social Isolation: 5 or more times a week   Intimate Partner Violence: Not on file   Housing Stability: Low Risk  (01/11/2023)    Received from [facility]    Housing Stability     Unstable Housing in the Last Year: Not on file       Review of Systems:     All systems reviewed and negative other than those noted in the HPI.    Physical Exam:     Vitals:    04/23/23 0745 04/23/23 1115 04/23/23 1515 04/23/23 1519   BP: (!) 97/58 (!) 102/92 132/79 132/79   Pulse: 96  95 96   Resp:   (!) 21    Temp:       SpO2: 99%  99%    Weight:       Height:       BMI:             Body mass index is 37.66 kg/m.    Intake and Output Summary (Last 24 hours) at Date Time    Intake/Output Summary (Last 24 hours) at 04/23/2023 1845  Last data filed at 04/23/2023 1123  Gross per 24 hour   Intake 389.71 ml   Output 250 ml   Net 139.71 ml       General/Constitutional: no acute distress  HEENT: moist mucous  membranes  Neck: supple, no JVD  Heart/Cardiovascular:   RRR  Systolic ejection murmur, no rubs/gallops  2+ radial pulses  2+ femoral pulses  2+ DP pulses  2+ PT pulses  No bruits throughout  Respiratory/Lungs: non-labored, clear to auscultation throughout  Abdominal/Gastrointestinal: soft, nontender  Extremities: warm and well-perfused, no edema x4  Skin: no rash  Neurological: alert and oriented x3, grossly nonfocal  Psych: calm    Labs Reviewed:   BMP:   Recent Labs     04/21/23  1739 04/22/23  0335 04/23/23  0702   SODIUM 141 139 139   POTASSIUM 4.3 4.3 4.6   CO2 30 29 27    BUN 20 24 41*   CREATININE 5.12* 5.53* 7.25*  GLUCOSE 168* 100 103     Magnesium:   Recent Labs     04/21/23  1739   MAGNESIUM 2.1     CBC:   Recent Labs     04/21/23  1739 04/21/23  2347 04/22/23  0335 04/23/23  0702   WBC 6.3 7.5 8.4 7.2   HGB 10.0* 9.5* 9.8* 8.9*   HCT 29.7* 28.2* 29.4* 27.6*   PLTCNT 183 188 183 181     Hepatic Function:   Recent Labs     04/21/23  1739   AST 31   ALT 10     Coags:   Recent Labs     04/22/23  0335 04/22/23  1028 04/22/23  1720 04/23/23  0055 04/23/23  0702 04/23/23  1410   APTT 44.0* 54.5* 34.1 64.9* 78.8* 60.8*     Cardiac Markers: No results found for this encounter  TSH:    Recent Labs     04/21/23  1739   TSH 0.022*     Lipids:   Recent Results (from the past 60454 hour(s))   LIPID PANEL    Collection Time: 04/22/23  3:35 AM   Result Value    CHOLESTEROL  107    HDL CHOL 37 (L)    TRIGLYCERIDES 106    LDL CALC 50    VLDL CALC 15    NON-HDL 70    CHOL/HDL RATIO 2.9         Rads:     Results for orders placed or performed during the hospital encounter of 04/21/23 (from the past 72 hour(s))   CT ANGIO CHEST FOR PULMONARY EMBOLUS W IV CONTRAST     Status: None    Narrative    PROCEDURE DESCRIPTION: CT ANGIO CHEST FOR PULMONARY EMBOLUS W IV CONTRAST    CTA chest was performed to evaluate for pulmonary embolism with 3D/MIP reconstructions generated.    CLINICAL INDICATION: PE eval    COMPARISON: CT chest  on pelvis dated November 07, 2022      FINDINGS: Thoracic kyphosis and degenerative changes of the spine are noted. There is abnormal subcutaneous air and fluid at the anterior neck extending from the thyroid to the subcutaneous tissues.    The left kidney is absent. The thoracic aorta shows no dissection or other acute abnormality. There is no pulmonary embolus. The heart is enlarged and there is coronary artery calcification. Suspect minor atelectasis at the lung bases. There is no pleural effusion.      Impression    1. No pulmonary embolus.  2. Abnormal fluid and air in the anterior neck extending from the thyroid fossa to the edge of the upper mediastinum. Superior most extent of this is not visualized.  The Epic database indicates that the patient has undergone subtotal parathyroidectomy on April 17, 2023. Findings in the CT likely reflect postoperative appearance. Infection or inflammation is not excluded based on imaging and clinical correlation recommended. Recommend follow-up to show resolution. If needed, CT soft tissue neck with IV contrast can BE obtained for additional assessment.            The CT exam was performed using one or more of the following dose reduction techniques: Automated exposure control, adjustment of the mA and/or kV according to the patient's size, or use of iterative reconstruction technique.        Radiologist location ID: [id number]         Cardiovascular Workup:     Echo 10/18/20  Technically  difficult due to patient body habitus.  The left ventricular ejection fraction by visual assessment is estimated to be 55-60%.  Mildly calcified aortic valve leaflets --especially the right coronary cusp.  There is a small mobile density on the aortic valve seen mainly in parasternal long axis views. No aortic valve stenosis or regurgitation.  The study images were of technically adequate quality.  Transthoracic complete echo with contrast, 2D, spectral and tissue Doppler, color flow  Doppler, M-mode.  Concentric remodeling.  Left ventricular diastolic parameters are normal.  The mitral valve is not well visualized.  No evidence of mitral stenosis.  There is mild mitral regurgitation.  Trace aortic regurgitation present.  The pulmonic valve is normal.  No significant pulmonic valve regurgitation present.  Trileaflet aortic valve.  There is a small mobile density on the aortic valve.  The interatrial septum is normal in appearance.  Normal IVC size with <50% inspiratory collapse (estimated RA pressure: 8 mmHg).  The aortic root is of normal size.  Normal pericardium with no pericardial effusion.     Echo 09/14/21  Normal left ventricular size.The left ventricular ejection fraction by visual assessment is estimated to be 60-65%.  Trace mitral regurgitation present.  Trace tricuspid regurgitation present.  There is mild aortic stenosis, mean PG 11 mmHg.     Stress Echo 09/15/22  This stress test was normal.      EKG 04/21/23  Sinus tachycardia  Anterior infarct  Marked ST abnormality, possible lateral subendocardial injury     Telemetry strips:   Sinus Tach    LEFT HEART CATHETERIZATION/PCI OF THE PROXIMAL LAD April 23, 2023:  Successful angioplasty and stenting of the ostial to mid LAD.    Moderate to severe aortic valvular stenosis suggested with a peak to peak aortic valve gradient of 38 mmHg.  Severely reduced LV global systolic function with an EF of 30% and severely elevated LVEDP of 30.      Assessment:     [name] is a 57 y.o. female with:    NSTEMI, Type I   -Troponin 760/875/1138/1371/1210  - EKG showing ST abnormality in lateral leads  - left heart catheterization on April 23, 2023 shows critically diseased proximal LAD which was stented.    HFrEF:  - LVEDP of 30 and left ventricular ejection fraction of 30% on LV angiography during catheterization April 23, 2023.    Aortic valvular stenosis:   - mild aortic stenosis reported on echo in December of 2022  - moderate to severe aortic  valve stenosis suggested with a peak to peak aortic valve gradient of 38 mmhg by catheterization April 23, 2023     HTN  -limited on medications due to end-stage renal disease  - BP 110-130's/ 50s     HLD  - 04/21/23 FLP Total 107 Tri 106 HDL 37 LDL 50     DM  - A1C 6.4     Renal CA  - s/p left radical nephrectomy 2021     ESRD  -on HD  -Cr 7.25     Hyperthyroid    Status post parathyroidectomy, July of 2024     Obesity  - BMI 37.66     Nonsmoker    Recommendations:     Left heart catheterization/?  PCI, risks/benefits/alternatives discussed, patient wished to proceed, consent obtained.  Patient taken to the cath lab and found to have a 99% proximal LAD lesion which was stented.  Her LV systolic and diastolic function was severely impaired with  an EF of 30% and an LVEDP of 30.  Dual antiplatelet therapy, high-intensity statin, GLP 1 agonist, and guideline directed medical therapy for reduced ejection fraction heart failure.  Echo to assess LV function and aortic valve      Thank you for consulting [place] and allowing [place] to participate in this patient's care! We will follow along while the patient is in the hospital.      Brayton Caves, MD

## 2023-04-23 NOTE — Care Plan (Signed)
Problem: Adult Inpatient Plan of Care  Goal: Plan of Care Review  Outcome: Ongoing (see interventions/notes)  Goal: Patient-Specific Goal (Individualized)  Outcome: Ongoing (see interventions/notes)  Goal: Absence of Hospital-Acquired Illness or Injury  Outcome: Ongoing (see interventions/notes)  Intervention: Identify and Manage Fall Risk  Recent Flowsheet Documentation  Taken 04/23/2023 0800 by Vicki Mallet, RN  Safety Promotion/Fall Prevention:   activity supervised   fall prevention program maintained   safety round/check completed   nonskid shoes/slippers when out of bed  Intervention: Prevent Skin Injury  Recent Flowsheet Documentation  Taken 04/23/2023 0800 by Vicki Mallet, RN  Body Position: sitting  Skin Protection: adhesive use limited  Goal: Optimal Comfort and Wellbeing  Outcome: Ongoing (see interventions/notes)  Intervention: Provide Person-Centered Care  Recent Flowsheet Documentation  Taken 04/23/2023 0800 by Vicki Mallet, RN  Trust Relationship/Rapport:   care explained   choices provided   emotional support provided   empathic listening provided   thoughts/feelings acknowledged  Goal: Rounds/Family Conference  Outcome: Ongoing (see interventions/notes)     Problem: Pain Acute  Goal: Optimal Pain Control and Function  Outcome: Ongoing (see interventions/notes)  Intervention: Optimize Psychosocial Wellbeing  Recent Flowsheet Documentation  Taken 04/23/2023 0800 by Vicki Mallet, RN  Diversional Activities:   television   smartphone  Supportive Measures: active listening utilized     Problem: Chest Pain  Goal: Resolution of Chest Pain Symptoms  Outcome: Ongoing (see interventions/notes)

## 2023-04-23 NOTE — Progress Notes (Signed)
END STAGE RENAL DISEASE - LHC then HD today. If LHC is delayed until late in day then HD will be changed to tomorrow.

## 2023-04-23 NOTE — Nurses Notes (Addendum)
PT returned from cath lab and VS was taken, monitor set to q 15 min, Angiomax stopped at 1915 and informed to start letting air out of radial cuff at 2115. No hematoma or active bleeding noted around insertion site, PT has good sensation in all ext and +2+2 pulses . PT denies any pain or other discomfort at this time.

## 2023-04-23 NOTE — Care Plan (Signed)
Problem: Adult Inpatient Plan of Care  Goal: Plan of Care Review  Outcome: Ongoing (see interventions/notes)  Goal: Patient-Specific Goal (Individualized)  Outcome: Ongoing (see interventions/notes)  Flowsheets (Taken 04/23/2023 2000)  Patient would like to participate in bedside shift report: Yes  Individualized Care Needs: Denies  Anxieties, Fears or Concerns: Denies  Patient-Specific Goals (Include Timeframe): None verablized  Goal: Absence of Hospital-Acquired Illness or Injury  Outcome: Ongoing (see interventions/notes)  Intervention: Identify and Manage Fall Risk  Recent Flowsheet Documentation  Taken 04/23/2023 2200 by Theodoro Grist, RN  Safety Promotion/Fall Prevention:   activity supervised   fall prevention program maintained   muscle strengthening facilitated   nonskid shoes/slippers when out of bed   motion sensor pad activated   safety round/check completed  Taken 04/23/2023 2100 by Theodoro Grist, RN  Safety Promotion/Fall Prevention:   activity supervised   fall prevention program maintained   muscle strengthening facilitated   nonskid shoes/slippers when out of bed   motion sensor pad activated   safety round/check completed  Taken 04/23/2023 2000 by Theodoro Grist, RN  Safety Promotion/Fall Prevention:   activity supervised   fall prevention program maintained   muscle strengthening facilitated   nonskid shoes/slippers when out of bed   motion sensor pad activated   safety round/check completed  Intervention: Prevent Skin Injury  Recent Flowsheet Documentation  Taken 04/23/2023 2000 by Theodoro Grist, RN  Body Position: supine, head elevated  Intervention: Prevent Infection  Recent Flowsheet Documentation  Taken 04/23/2023 2000 by Theodoro Grist, RN  Infection Prevention:   barrier precautions utilized   equipment surfaces disinfected   environmental surveillance performed   glycemic control managed   personal protective equipment utilized   rest/sleep promoted   promote  handwashing   visitors restricted/screened   single patient room provided  Goal: Optimal Comfort and Wellbeing  Outcome: Ongoing (see interventions/notes)  Intervention: Provide Person-Centered Care  Recent Flowsheet Documentation  Taken 04/23/2023 2000 by Theodoro Grist, RN  Trust Relationship/Rapport:   care explained   choices provided   emotional support provided   empathic listening provided   questions answered   questions encouraged   reassurance provided   thoughts/feelings acknowledged  Goal: Rounds/Family Conference  Outcome: Ongoing (see interventions/notes)     Problem: Pain Acute  Goal: Optimal Pain Control and Function  Outcome: Ongoing (see interventions/notes)  Intervention: Prevent or Manage Pain  Recent Flowsheet Documentation  Taken 04/23/2023 2000 by Theodoro Grist, RN  Medication Review/Management: medications reviewed  Intervention: Optimize Psychosocial Wellbeing  Recent Flowsheet Documentation  Taken 04/23/2023 2000 by Theodoro Grist, RN  Diversional Activities:   television   smartphone     Problem: Chest Pain  Goal: Resolution of Chest Pain Symptoms  Outcome: Ongoing (see interventions/notes)

## 2023-04-23 NOTE — Progress Notes (Signed)
[facility]   [facility]  Progress Note  Date/Time: 04/23/2023 08:23  Patient Name: [name]  MRN#: [id number]  DOB: Feb 23, 1966  Admission Date: 04/21/2023   Consulting Physician: Harless Litten, MD  Consulting Cardiologist: Dr. Venita Lick    Subjective:   Denies chest pain, SOB, palpitations.  Patient reported that chest pain occurred this past Saturday, 04/21/23, while she was sitting in her recliner.  She states that it is left-sided and described it as a stabbing sensation.  She reports she would have rated it 10/10.  She denies any pain currently.  Pain did not radiate but does endorse shortness of breath and nausea that occurred while having the chest pain.  She stated that it did worsen with exertion.  She reports different than other chest pressure she has had in the past which her Xanax will usually helps take care of.  She felt it had just been anxiety but after she had taken her Xanax and it had not helped, she presented to the ER.  She was scheduled for HD today, which was pushed to tomorrow given scheduled catheterization for today.  Her troponins peaked at 1371.  She reports having a family history of heart disease as her father passed away this past 01-09-2023 from a heart attack.  BP 110-130/ 50's. HR 90-100's.  K 4.3 Cr 5.53 H/H 9.8/29.4     Physical Exam:     Vitals:    04/23/23 0515 04/23/23 0530 04/23/23 0600 04/23/23 0700   BP: 113/67 113/67     Pulse: 99 97 99    Resp:       Temp:    36.4 C (97.6 F)   SpO2:  90% 91%    Weight:       Height:       BMI:             Body mass index is 37.66 kg/m.    Intake and Output Summary (Last 24 hours) at Date Time    Intake/Output Summary (Last 24 hours) at 04/23/2023 5409  Last data filed at 04/22/2023 2000  Gross per 24 hour   Intake 389.71 ml   Output --   Net 389.71 ml       Physical Exam  Vitals and nursing note reviewed.   Constitutional:       Appearance: Normal appearance. She is obese.   Cardiovascular:      Rate and Rhythm:  Normal rate and regular rhythm.      Pulses:           Radial pulses are 2+ on the right side and 2+ on the left side.        Dorsalis pedis pulses are 1+ on the right side and 1+ on the left side.      Heart sounds: S1 normal and S2 normal. Murmur heard.      Comments: L AV fistula + bruit/thrill  Pulmonary:      Effort: Pulmonary effort is normal.      Breath sounds: Normal breath sounds. No rales.   Abdominal:      Palpations: Abdomen is soft.   Musculoskeletal:      Right lower leg: No edema.      Left lower leg: No edema.   Skin:     General: Skin is warm and dry.      Capillary Refill: Capillary refill takes less than 2 seconds.   Neurological:      Mental  Status: She is alert and oriented to person, place, and time. Mental status is at baseline.   Psychiatric:         Mood and Affect: Mood normal.         Behavior: Behavior normal.         Thought Content: Thought content normal.         Judgment: Judgment normal.              Medications:     Current Facility-Administered Medications   Medication Dose Route Frequency    acetaminophen (TYLENOL) tablet  1,000 mg Oral Q6H PRN    ALPRAZolam (XANAX) tablet  0.25 mg Oral Q8H PRN    aspirin chewable tablet 81 mg  81 mg Oral Daily    atorvastatin (LIPITOR) tablet  20 mg Oral QPM    B complex-vitamin C-folic acid (NEPHRO-VITE) tablet  1 Tablet Oral NIGHTLY    cyclobenzaprine (FLEXERIL) tablet  5 mg Oral 3x/day PRN    epoetin alfa-epbx (RETACRIT) 10,000 units/mL injection  10,000 Units Intravenous Give in Dialysis    escitalopram (LEXAPRO) tablet  10 mg Oral NIGHTLY    heparin 25,000 units in 1/2 NS 250 mL infusion  12 Units/kg/hr (Adjusted) Intravenous Continuous    labetalol (NORMODYNE) tablet  200 mg Oral Q8HRS    losartan (COZAAR) tablet  25 mg Oral Once per day on Sunday Tuesday Thursday Saturday    magnesium hydroxide (MILK OF MAGNESIA) 400mg  per 5mL oral liquid  30 mL Oral HS PRN    melatonin tablet  9 mg Oral NIGHTLY    nitroGLYCERIN (NITROSTAT) sublingual  tablet  0.4 mg Sublingual Q5 Min PRN    NS 250 mL flush bag   Intravenous Q1H PRN    NS flush syringe  10 mL Intravenous Q8H    NS flush syringe  10 mL Intravenous Q8HRS    NS flush syringe  10 mL Intravenous Q1H PRN    ondansetron (ZOFRAN ODT) rapid dissolve tablet  4 mg Oral Q6H PRN    prochlorperazine (COMPAZINE) 5 mg/mL injection  10 mg Intravenous Q6H PRN    promethazine-dextromethorphan (PHENERGAN-DM) 6.25-15 mg per 5 mL oral liquid  5 mL Oral Q6H PRN    sevelamer carbonate (RENVELA) tablet  1,600 mg Oral 3x/day-Meals    traMADol (ULTRAM) tablet  50 mg Oral Q6H PRN    zolpidem (AMBIEN) tablet  5 mg Oral HS PRN     Prior to Admission medications    Medication Sig Start Date End Date Taking? Authorizing Provider   albuterol sulfate (PROVENTIL OR VENTOLIN OR PROAIR) 90 mcg/actuation Inhalation HFA Aerosol Inhaler Take 2 Puffs by inhalation Every 4 hours as needed  Patient not taking: Reported on 04/09/2023 08/22/19   Daryl Eastern, MD   ALPRAZolam Prudy Feeler) 0.25 mg Oral Tablet Take 1 Tablet (0.25 mg total) by mouth Once per day as needed for Anxiety 06/03/20   Provider, Historical   atorvastatin (LIPITOR) 20 mg Oral Tablet Take 1 Tablet (20 mg total) by mouth Every evening    Provider, Historical   cyclobenzaprine (FLEXERIL) 5 mg Oral Tablet Take 1 Tablet (5 mg total) by mouth Three times a day as needed for Muscle spasms    Provider, Historical   diatrizoate meg-diatrizoat sod 66-10 % Oral Solution Mix entire bottle of Gastrografin/Gastroview with 32 oz of your choice Non Carbonated beverage (Water, Juice, Gatorade, etc). Complete entire mixture 1 hour prior to exam arrival.  Patient not taking: Reported on 04/09/2023 11/02/22  Sheliah Plane, FNP-BC   escitalopram oxalate (LEXAPRO) 10 mg Oral Tablet Take 1 Tablet (10 mg total) by mouth Every night    Provider, Historical   Fish Oil-Omega-3 Fatty Acids 360-1,200 mg Oral Capsule Take 2 Capsules by mouth Twice daily    Provider, Historical   fluticasone (FLONASE) 50  mcg/actuation Nasal Spray, Suspension Administer 1 Spray into each nostril Once per day as needed (Allergy symptoms (nasal congestion/ runny nose))    Provider, Historical   iron sucrose (VENOFER) 50 mg iron/2.5 mL Intravenous Solution 2.5 mL (50 mg total) 08/22/21   Provider, Historical   labetaloL (NORMODYNE) 200 mg Oral Tablet Take 1 Tablet (200 mg total) by mouth Every 8 hours for 30 days Indications: Hold for SBP<120 or HR<55 05/26/21 10/13/21  Eloisa Northern, MD   loperamide (IMODIUM) 2 mg Oral Capsule Take 2 Capsules (4 mg total) by mouth Every 6 hours as needed (Diarrhea)    Provider, Historical   loratadine (CLARITIN) 10 mg Oral Tablet Take 1 Tablet (10 mg total) by mouth Once per day as needed (Allergies)    Provider, Historical   losartan (COZAAR) 25 mg Oral Tablet Take 1 Tablet (25 mg total) by mouth Once a day for 30 days  Patient taking differently: Take 1 Tablet (25 mg total) by mouth Take in the evening on non-dialysis days (Sun/Tues/Thurs/Sat); does not take on dialysis days 03/30/20 04/09/23  Wandra Feinstein, MD   [facility] 7.5 mg/0.5 mL Subcutaneous Pen Injector Inject 0.5 mL (7.5 mg total) under the skin Every 7 days 01/23/23   Provider, Historical   omega-3-DHA-EPA-fish oil (FISH OIL) 1,000 mg (120 mg-180 mg) Oral Capsule Take by mouth 09/30/21   Provider, Historical   promethazine-dextromethorphan (PHENERGAN-DM) 6.25-15 mg/5 mL Oral Syrup TAKE 5 ML BY MOUTH 4 TIMES DAILY AS NEEDED FOR COUGH    Provider, Historical   sevelamer carbonate (RENVELA) 800 mg Oral Tablet Take 2 Tablets (1,600 mg total) by mouth 03/08/21   Provider, Historical   traMADoL (ULTRAM) 50 mg Oral Tablet Take 1 Tablet (50 mg total) by mouth Every 6 hours as needed for Pain 10/28/20   Myrlene Broker, MD   TRIPHROCAPS 1 mg Oral Capsule Take 1 Capsule by mouth Every night    Provider, Historical   vitamin E 100 unit Oral Capsule Take 1 Capsule (100 Units total) by mouth Twice daily    Provider, Historical   vitamin E, dl, acetate, (AQUASOL E) 45 mg  (100 unit) Oral Capsule Take by mouth  Patient not taking: Reported on 04/09/2023 09/30/21   Provider, Historical   calcitrioL (ROCALTROL) 0.25 mcg Oral Capsule Take 1 Capsule (0.25 mcg total) by mouth 08/03/20 04/21/23  Provider, Historical   cinacalcet (SENSIPAR) 60 mg Oral Tablet TAKE 1 TABLET BY MOUTH 3 TIMES A WEEK AS DIRECTED (MON, WED, FRI) AFTER DIALYSIS WITH AFTERNOON OR EVENING MEAL 01/25/23 04/21/23  Provider, Historical   JANUVIA 50 mg Oral Tablet Take 1 Tablet (50 mg total) by mouth Every morning  Patient not taking: Reported on 04/09/2023 04/19/20 04/21/23  Provider, Historical       Labs Reviewed:     Recent Labs     04/21/23  1739 04/22/23  0335 04/23/23  0702   SODIUM 141 139 139   POTASSIUM 4.3 4.3 4.6   CO2 30 29 27    BUN 20 24 41*   CREATININE 5.12* 5.53* 7.25*   GLUCOSE 168* 100 103     Recent Labs     04/21/23  1739  MAGNESIUM 2.1     Recent Labs     04/21/23  1739 04/21/23  2347 04/22/23  0335 04/23/23  0702   WBC 6.3 7.5 8.4 7.2   HGB 10.0* 9.5* 9.8* 8.9*   HCT 29.7* 28.2* 29.4* 27.6*   PLTCNT 183 188 183 181     Recent Labs     04/21/23  1739   AST 31   ALT 10     Recent Labs     04/22/23  0335 04/22/23  1028 04/22/23  1720 04/23/23  0055 04/23/23  0702   APTT 44.0* 54.5* 34.1 64.9* 78.8*     No results found for this encounter  Recent Labs     04/21/23  1739   TSH 0.022*     Recent Results (from the past 63875 hour(s))   LIPID PANEL    Collection Time: 04/22/23  3:35 AM   Result Value    CHOLESTEROL  107    HDL CHOL 37 (L)    TRIGLYCERIDES 106    LDL CALC 50    VLDL CALC 15    NON-HDL 70    CHOL/HDL RATIO 2.9            Personally reviewed by Hanley Seamen, NP    Rads:     Results for orders placed or performed during the hospital encounter of 04/21/23 (from the past 72 hour(s))   CT ANGIO CHEST FOR PULMONARY EMBOLUS W IV CONTRAST     Status: None    Narrative    PROCEDURE DESCRIPTION: CT ANGIO CHEST FOR PULMONARY EMBOLUS W IV CONTRAST    CTA chest was performed to evaluate for pulmonary embolism  with 3D/MIP reconstructions generated.    CLINICAL INDICATION: PE eval    COMPARISON: CT chest on pelvis dated November 07, 2022      FINDINGS: Thoracic kyphosis and degenerative changes of the spine are noted. There is abnormal subcutaneous air and fluid at the anterior neck extending from the thyroid to the subcutaneous tissues.    The left kidney is absent. The thoracic aorta shows no dissection or other acute abnormality. There is no pulmonary embolus. The heart is enlarged and there is coronary artery calcification. Suspect minor atelectasis at the lung bases. There is no pleural effusion.      Impression    1. No pulmonary embolus.  2. Abnormal fluid and air in the anterior neck extending from the thyroid fossa to the edge of the upper mediastinum. Superior most extent of this is not visualized.  The Epic database indicates that the patient has undergone subtotal parathyroidectomy on April 17, 2023. Findings in the CT likely reflect postoperative appearance. Infection or inflammation is not excluded based on imaging and clinical correlation recommended. Recommend follow-up to show resolution. If needed, CT soft tissue neck with IV contrast can BE obtained for additional assessment.            The CT exam was performed using one or more of the following dose reduction techniques: Automated exposure control, adjustment of the mA and/or kV according to the patient's size, or use of iterative reconstruction technique.        Radiologist location ID: [id number]         Personally reviewed by Hanley Seamen, NP    Cardiovascular Workup:     Echo 10/18/20  Technically difficult due to patient body habitus.  The left ventricular ejection fraction by visual assessment is estimated to be 55-60%.  Mildly calcified aortic  valve leaflets --especially the right coronary cusp.  There is a small mobile density on the aortic valve seen mainly in parasternal long axis views. No aortic valve stenosis or regurgitation.  The study  images were of technically adequate quality.  Transthoracic complete echo with contrast, 2D, spectral and tissue Doppler, color flow Doppler, M-mode.  Concentric remodeling.  Left ventricular diastolic parameters are normal.  The mitral valve is not well visualized.  No evidence of mitral stenosis.  There is mild mitral regurgitation.  Trace aortic regurgitation present.  The pulmonic valve is normal.  No significant pulmonic valve regurgitation present.  Trileaflet aortic valve.  There is a small mobile density on the aortic valve.  The interatrial septum is normal in appearance.  Normal IVC size with <50% inspiratory collapse (estimated RA pressure: 8 mmHg).  The aortic root is of normal size.  Normal pericardium with no pericardial effusion.    Echo 09/14/22  Normal left ventricular size.The left ventricular ejection fraction by visual assessment is estimated to be 60-65%.  Trace mitral regurgitation present.  Trace tricuspid regurgitation present.  There is mild aortic stenosis, mean PG 11 mmHg.    Stress Echo 09/15/22  This stress test was normal.     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)    Hypercholesterolemia       Assessment:     Zohra Bibb is 57 y.o. woman with     NSTEMI  - Likely Type I   -Troponin 760/875/1138/1371/1210  - EKG showing ST abnormality in lateral leads      HTN  -limited on medications due to end-stage renal disease  - BP 110-130's/ 50s    HLD  - 04/21/23 FLP Total 107 Tri 106 HDL 37 LDL 50    DM  - A1C 6.4    Kidney CA  - s/p left radical nephrectomy 2021    ESRD  -on HD  -Cr 7.25    Hyperthyroid    Obesity  - BMI  37.66    Nonsmoker      Plan:     - LHC scheduled for today. Discussed the risks and benefits of undergoing cardiac catheterization, including the small risk of MI, CVA, bleeding, death, vascular injury, allergic reaction, and contrast nephropathy. The patient elected to proceed and informed consent obtained.   - Continue losartan as scheduled for BP control on non dialysis days  -continue labetalol 200 mg Q 8 for blood pressure management  -continue heparin drip per acute coronary syndrome protocol  - Echo pending        Thank you for allowing [place] to participate in the care of your patient. Please feel free to contact us if further questions arise.    Hanley Seamen, NP  [facility]       I independently of the faculty provider spent a total of (30) minutes in direct care of this patient including initial evaluation, review of laboratory, radiology, diagnostic studies, review of medical record, order entry and coordination of care.  Portions of this note may be dictated using voice recognition software or a dictation service. Variances in spelling and vocabulary are possible and unintentional. Not all errors are caught/corrected. Please notify the [facility] Parkin if any discrepancies are noted, or if the meaning of any statement is not clear.     Attending physician Attestation:  Patient seen and examined  Agree with plan as aforementioned.     Thank you for consulting [facility] for the care of this patient.   Will follow while inpatient and call us as further questions arise.       Dr. Venita Lick MD  ABIM Board Certified In Cardiovascular Disease  ABIM Board Certified In Internal Medicine  [facility], [address]  Ph: [phone]  Ph: [phone]  (cell): [phone]

## 2023-04-23 NOTE — Progress Notes (Signed)
[facility]   Medicine Progress Note    Euleta Oni  Date of service: 04/23/2023  Date of Admission:  04/21/2023    Hospital Day:  LOS: 2 days     A/P:   Acute chest pain  Non ST-Elevation Myocardial Infarction (NSTEMI)      -Monitor on telemetry  -Goal-Directed Medical Therapy                -(ASA) 81 mg daily (324 mg chewable given in ED)                -(statin) atorvastatin 20 mg every evening as at home                -(ACEI/ARB) losartan 25 mg (already on this on non-dialysis days; continue)                -(b-blocker) labetalol 200 mg Q8H as at home                - IV heparin drip and follow protocol  --NPO now for cath  -Cardiology consult, Plan for cath today.         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 Plan for Monday noted. L     Hypertension - continue labetalol PO and losartan on non-dialysis days as at home; monitor BP trends     Diabetes Mellitus II, controlled - glucose 168 but last HA1c on 04/09/23 was 5.8;takes Mounjaro every 7 days at home; FSBS with SSIP; diabetic diet; continue diabetic teaching     Anxiety/Depression - continue Lexapro and alprazolam as at home    Subjective:     No CP or sob     Vital Signs:  Temp  Avg: 36.7 C (98.1 F)  Min: 36.4 C (97.6 F)  Max: 36.9 C (98.4 F)    Pulse  Avg: 101.5  Min: 97  Max: 111 BP  Min: 112/56  Max: 134/63   Resp  Avg: 22.9  Min: 15  Max: 31 SpO2  Avg: 92.6 %  Min: 87 %  Max: 98 %          Physical Exam:  Constitutional: no distress  Respiratory: Clear to auscultation bilaterally.   Cardiovascular: regular rate and rhythm  Gastrointestinal: Soft, non-tender, Bowel sounds normal  Musculoskeletal: No edema of lower extremities  Neuro:     Today's Labs and medication Reviewed.         Harless Litten, MD

## 2023-04-24 ENCOUNTER — Inpatient Hospital Stay (HOSPITAL_COMMUNITY): Payer: 59

## 2023-04-24 DIAGNOSIS — I249 Acute ischemic heart disease, unspecified: Secondary | ICD-10-CM

## 2023-04-24 DIAGNOSIS — I350 Nonrheumatic aortic (valve) stenosis: Secondary | ICD-10-CM

## 2023-04-24 DIAGNOSIS — Z79899 Other long term (current) drug therapy: Secondary | ICD-10-CM

## 2023-04-24 DIAGNOSIS — Z7984 Long term (current) use of oral hypoglycemic drugs: Secondary | ICD-10-CM

## 2023-04-24 DIAGNOSIS — I080 Rheumatic disorders of both mitral and aortic valves: Secondary | ICD-10-CM

## 2023-04-24 DIAGNOSIS — Z85528 Personal history of other malignant neoplasm of kidney: Secondary | ICD-10-CM

## 2023-04-24 DIAGNOSIS — E669 Obesity, unspecified: Secondary | ICD-10-CM

## 2023-04-24 DIAGNOSIS — Z7985 Long-term (current) use of injectable non-insulin antidiabetic drugs: Secondary | ICD-10-CM

## 2023-04-24 DIAGNOSIS — E1122 Type 2 diabetes mellitus with diabetic chronic kidney disease: Secondary | ICD-10-CM

## 2023-04-24 DIAGNOSIS — Z955 Presence of coronary angioplasty implant and graft: Secondary | ICD-10-CM

## 2023-04-24 DIAGNOSIS — I1311 Hypertensive heart and chronic kidney disease without heart failure, with stage 5 chronic kidney disease, or end stage renal disease: Secondary | ICD-10-CM

## 2023-04-24 DIAGNOSIS — N186 End stage renal disease: Secondary | ICD-10-CM

## 2023-04-24 LAB — RENAL FUNCTION PANEL
ALBUMIN: 2.5 g/dL — ABNORMAL LOW (ref 3.5–5.0)
ANION GAP: 13 mmol/L (ref 4–13)
BUN/CREA RATIO: 6 (ref 6–22)
BUN: 48 mg/dL — ABNORMAL HIGH (ref 8–25)
CALCIUM: 7.1 mg/dL — ABNORMAL LOW (ref 8.6–10.2)
CHLORIDE: 101 mmol/L (ref 96–111)
CO2 TOTAL: 23 mmol/L (ref 22–30)
CREATININE: 7.74 mg/dL — ABNORMAL HIGH (ref 0.60–1.05)
ESTIMATED GFR - FEMALE: 6 mL/min/BSA — ABNORMAL LOW (ref >=60)
GLUCOSE: 228 mg/dL — ABNORMAL HIGH (ref 65–125)
PHOSPHORUS: 4.4 mg/dL (ref 2.4–4.7)
POTASSIUM: 4.4 mmol/L (ref 3.5–5.1)
SODIUM: 137 mmol/L (ref 136–145)

## 2023-04-24 LAB — RETICULOCYTE COUNT
IMMATURE RETIC FRACTION: 10 % (ref 2.7–15.9)
RETICULOCYTE % AUTOMATED: 1.7 % (ref 0.50–2.20)
RETICULOCYTE HEMOGLOBIN EQUIVALENT: 33.2 pg (ref 28.0–38.0)
RETICULOCYTES COUNT # AUTOMATED: 38.1 10*3/uL (ref 17.0–100.0)

## 2023-04-24 LAB — ECG 12-LEAD
Atrial Rate: 97 {beats}/min
Calculated P Axis: -4 degrees
Calculated R Axis: -20 degrees
Calculated T Axis: 76 degrees
PR Interval: 126 ms
QRS Duration: 84 ms
QT Interval: 394 ms
QTC Calculation: 500 ms
Ventricular rate: 97 {beats}/min

## 2023-04-24 LAB — CBC
HCT: 22.9 % — ABNORMAL LOW (ref 34.8–46.0)
HGB: 7.7 g/dL — ABNORMAL LOW (ref 11.5–16.0)
MCH: 34.4 pg — ABNORMAL HIGH (ref 26.0–32.0)
MCHC: 33.6 g/dL (ref 31.0–35.5)
MCV: 102.2 fL — ABNORMAL HIGH (ref 78.0–100.0)
MPV: 9.7 fL (ref 8.7–12.5)
PLATELETS: 159 10*3/uL (ref 150–400)
RBC: 2.24 10*6/uL — ABNORMAL LOW (ref 3.85–5.22)
RDW-CV: 15.8 % — ABNORMAL HIGH (ref 11.5–15.5)
WBC: 6.1 10*3/uL (ref 3.7–11.0)

## 2023-04-24 LAB — TRANSFERRIN SATURATION PANEL
IRON (TRANSFERRIN) SATURATION: 48 % (ref 15–50)
IRON: 110 ug/dL (ref 45–170)
TOTAL IRON BINDING CAPACITY: 231 ug/dL — ABNORMAL LOW (ref 252–504)
TRANSFERRIN: 165 mg/dL — ABNORMAL LOW (ref 180–360)

## 2023-04-24 LAB — FERRITIN: FERRITIN: 2999 ng/mL — ABNORMAL HIGH (ref 5–200)

## 2023-04-24 MED ORDER — LOSARTAN 25 MG TABLET
25.0000 mg | ORAL_TABLET | Freq: Every evening | ORAL | Status: DC
Start: 2023-04-24 — End: 2023-04-25
  Administered 2023-04-24: 25 mg via ORAL
  Filled 2023-04-24: qty 1

## 2023-04-24 MED ORDER — HEPARIN (PORCINE) 1,000 UNIT/ML INJECTION FOR DIALYSIS
3200.0000 [IU] | INTRAMUSCULAR | Status: AC
Start: 2023-04-24 — End: 2023-04-24
  Administered 2023-04-24: 3200 [IU]

## 2023-04-24 MED ORDER — HYDRALAZINE 10 MG TABLET
10.0000 mg | ORAL_TABLET | Freq: Three times a day (TID) | ORAL | Status: DC
Start: 2023-04-24 — End: 2023-04-25
  Administered 2023-04-24 (×2): 10 mg via ORAL
  Administered 2023-04-25 (×2): 0 mg via ORAL
  Filled 2023-04-24 (×4): qty 1

## 2023-04-24 MED ORDER — SODIUM CHLORIDE 0.9 % INJECTION SOLUTION
2.0000 mL | INTRAVENOUS | Status: AC
Start: 2023-04-24 — End: 2023-04-24
  Administered 2023-04-24: 2 mL via INTRAVENOUS

## 2023-04-24 MED ORDER — CALCITRIOL 0.25 MCG CAPSULE
1.0000 ug | ORAL_CAPSULE | Freq: Every day | ORAL | Status: DC
Start: 2023-04-24 — End: 2023-04-25
  Administered 2023-04-24 – 2023-04-25 (×2): 1 ug via ORAL
  Filled 2023-04-24 (×3): qty 4

## 2023-04-24 MED ORDER — CALCIUM 200 MG (AS CALCIUM CARBONATE 500 MG) CHEWABLE TABLET
500.0000 mg | CHEWABLE_TABLET | Freq: Three times a day (TID) | ORAL | Status: DC
Start: 2023-04-24 — End: 2023-04-25
  Administered 2023-04-24 – 2023-04-25 (×5): 500 mg via ORAL
  Filled 2023-04-24 (×5): qty 1

## 2023-04-24 NOTE — Care Management Notes (Signed)
Consult re: Brilinta noted.    Pt was still in the dialysis suite this afternoon when I met with her.  She was almost finished with her treatment.  Discussed manufacturer's cost savings coupon offers with her and planned I would leave the brochure on her over the bed table.  She was in agreement and stated her husband should be there.   I had checked the room for her before going to the HD suite and he was not there.  Upon my return, he had just arrived.  Coupon offer left as planned and informed him I talked with her and also, she would soon be returning to the room.

## 2023-04-24 NOTE — Nurses Notes (Signed)
"HEMODIALYSIS SUMMARY"     PATIENT: [name] MRN: [id number] ROOM: 211/A      ORDERS:  Order  HEMODIALYSIS [DIACHI1] (Order [id number])  Oluwadamilola, Dealmeida  Order #: [id number] Accession #: [id number]: [id number]  General Information    No case/log ID found     Order Providers    Authorizing   Mendel Corning, MD            Order Information    Date Department Ordering/Authorizing   04/24/2023 [facility] 28F Mendel Corning, MD       Start Date/Time    Start Time   04/24/23 0500           Acknowledgement Info    For At Acknowledged By Acknowledged On   Placing Order 04/24/23 0505 Theodoro Grist, RN 04/24/23 3244         Released/Completed Orders    Released On Released By Completed On Completed By   04/24/23 0505 Mendel Corning, MD (auto-released)                       Standing Order Information    Remaining Occurrences Interval Last Released     0/1 ONE TIME 04/24/2023                  Released Orders      Released On Order # Scheduled For Released By   1. 04/24/2023  5:05 AM [id number] 04/24/2023  5:00 AM Mendel Corning, MD (auto-released)                  Comments    Heparin 800 units/hour.   Keep SBP > 100 mmHg at all times during treatment.            Order Questions    Question Answer   Reason for Treatment: End stage renal disease(CKD-Stage6)   Length of Dialysis (hrs): 4   If unable to maintain flow due to inadequate vascular access patency, patient intoleralance, or elevated venous pressure, adjust blood flow rate between: 250   Target Blood Flow Rate (ml/min): 400   Size of fistula needle: 15 gauge - 1 inch   Dialyzer: Nipro 17H   Fluid Removal (kg): 2.0   Ultrafiltration Goal (L): 2   if unable to maintain this UFR due to patient intolerance (i.e. hypotension, chest pain, muscle cramping, nausea or vomiting), adjust UFR to achieve this range in Liters : 0-2   Dialysate flow rate (ml/min): 700   Acid Bath: 3 K / 2.5 Ca   Bicarb Settings: Other   Other: 32   Sodium Type: Linear   Sodium: 137   Calcium: 3.5    Type of access: Fistula   Dialysate temperature: 36 degrees Celsius                    Additional Information    Associated Reports   View Encounter   Priority and Order Details         HEMODIALYSIS    Electronically signed by: Mendel Corning, MD on 04/24/23 0505   Ordering user: Mendel Corning, MD 04/24/23 0505 Ordering provider: Mendel Corning, MD   Authorized by: Mendel Corning, MD Ordering mode: Standard     Authorizing Provider NPI information    Mendel Corning, MD                [phone]  Patient Release Status:     This result is automatically blocked from the patient.               [facility] Result Comments     Not Released  Not seen          Order Transmittal Tracking/Results Routing Info    Order Transmittal Tracking/Results Routing Info       Order Set Info    Order Set Source   Did not come from Order set      Reprint Order    HEMODIALYSIS (Order [id number]) on 04/24/23          LABS:  RENAL FUNCTION PANEL  Order: [id number]   Status: Final result     Visible to patient: Yes (seen)     Next appt: 04/10/2024 at 10:00 AM in Urology Wandra Feinstein, MD)     0 Result Notes            Component  Ref Range & Units 10:26  (04/24/23) 1 d ago  (04/23/23) 2 d ago  (04/22/23) 3 d ago  (04/21/23) 3 d ago  (04/21/23) 1 yr ago  (02/22/22) 1 yr ago  (11/30/21)   SODIUM  136 - 145 mmol/L 137  139  139   141  140  135 Low     POTASSIUM  3.5 - 5.1 mmol/L 4.4  4.6  4.3   4.3  4.6  4.1    CHLORIDE  96 - 111 mmol/L 101  100  100   100  101  94 Low     CO2 TOTAL  22 - 30 mmol/L 23  27  29   30  27  29     ANION GAP  4 - 13 mmol/L 13  12  10   11  12  12     BUN  8 - 25 mg/dL 48 High   41 High   24   20  46 High   17    CREATININE  0.60 - 1.05 mg/dL 7.82 High   9.56 High   5.53 High    5.12 High   7.06 High   3.94 High     BUN/CREA RATIO  6 - 22 6  6  4  Low    4 Low   7  4 Low     CALCIUM  8.6 - 10.2 mg/dL 7.1 Low   7.4 Low  CM  9.7 CM   10.7 High  CM  9.4 R  10.3 High  R    Comment: Gadolinium-containing contrast  can interfere with calcium measurement.    GLUCOSE  65 - 125 mg/dL 213 High   086  578   469 High   141 High   172 High     PHOSPHORUS  2.4 - 4.7 mg/dL 4.4    3.6 CM       ALBUMIN  3.5 - 5.0 g/dL  2.5 Low      3.1 Low   3.2 Low   3.9    ESTIMATED GFR - FEMALE  >=60 mL/min/BSA 6 Low   6 Low  CM  9 Low  CM   9 Low  CM      Comment: Estimated Glomerular Filtration Rate (eGFR) is calculated using the gender-dependent CKD-EPI (2021) equation, intended for patients 49 years of age and older.     If patient sex is not documented, or if patient sex and gender are incongruent, eGFR results calculated for both  female and female will be reported. Recommend correlation and careful interpretation of patient history, keeping in mind that serum creatinine is influenced by hormone therapy.     Stage, GFR, Classification   G1, 90, Normal or High   G2, 60-89, Mildly decreased   G3a, 45-59, Mildly to moderately decreased   G3b, 30-44, Moderately to severely decreased   G4, 15-29, Severely decreased   G5, <15, Kidney failure   In the absence of kidney damage, neither G1 or G2 fulfill criteria for CKD per KDIGO.    Resulting Agency [facility]              Specimen Collected: 04/24/23 10:26 Last Resulted: 04/24/23 10:55        Lab Flowsheet     Order Details     View Encounter     Lab and Collection Details     Routing     Result History     View All Conversations on this Encounte      CBC  Order: [id number]   Status: Final result     Visible to patient: Yes (seen)     Next appt: 04/10/2024 at 10:00 AM in Urology Wandra Feinstein, MD)     0 Result Notes            Component  Ref Range & Units 1 d ago  (04/23/23) 2 d ago  (04/22/23) 3 d ago  (04/21/23) 3 d ago  (04/21/23) 1 yr ago  (02/22/22) 1 yr ago  (11/30/21) 1 yr ago  (09/14/21)   WBC  3.7 - 11.0 x10^3/uL 7.2  8.4  7.5  6.3  7.1  11.1 High   5.6    RBC  3.85 - 5.22 x10^6/uL 2.62 Low   2.85 Low   2.77 Low   2.91 Low   2.80 Low   3.36 Low   2.63 Low      HGB  11.5 - 16.0 g/dL 8.9 Low   9.8 Low   9.5 Low   10.0 Low   10.0 Low   11.7  8.7 Low     HCT  34.8 - 46.0 % 27.6 Low   29.4 Low   28.2 Low   29.7 Low   29.9 Low   34.8  27.7 Low     MCV  78.0 - 100.0 fL 105.3 High   103.2 High   101.8 High   102.1 High   106.8 High   103.6 High   105.3 High     MCH  26.0 - 32.0 pg 34.0 High   34.4 High   34.3 High   34.4 High   35.7 High   34.8 High   33.1 High     MCHC  31.0 - 35.5 g/dL 24.4  01.0  27.2  53.6  33.4  33.6  31.4    RDW-CV  11.5 - 15.5 % 15.7 High   15.8 High   15.5  15.5  14.4  16.9 High   15.9 High     PLATELETS  150 - 400 x10^3/uL 181  183  188  183  258  273  252    MPV  8.7 - 12.5 fL 10.2  10.1  10.2  9.9  10.1  10.0  10.2    Resulting Agency [facility]  Specimen Collected: 04/23/23 07:02 Last Resulted: 04/23/23 07:18        Lab Flowsheet     Order Details     View Encounter     Lab and Collection Details     Routing     Result History     View All Conversations on this Encounter           Result Care Coordination      Patient Communication     Released  Seen Back to Top      IMMUNO CHEMISTRY    Component   Ref Range & Units 8 d ago Comments   Hep B Surface Ag   Negative Negative     Hepatitis B Surface Ab   mIU/mL 102    The anti-HBs (Hepatitis B surface antibody) is greater than or equal to   10 mIU/mL and implies immunity. The patient has either had an antibody   response to HBV vaccination, received a transfusion, or has recovered   from HBV infection. For post-vaccination antibody testing guidelines for   the general public, refer to MMWR September 16, 2004/Vol.54 (No. 16);   1-23, and for healthcare workers, refer to MMWR September 13, 2012/Vol.62   (No. 10); 1-18.     Reference Range:   <10 mIU/mL       Non-Immune   >=10 mIU/mL      Immune   The magnitude of the measured result above 10 mIU/mL is not indicative   of the total amount of antibody present.   Resulting Agency [facility]     Narrative  Performed by [employer]  Unless otherwise specified, test(s) performed at:   [facility], [address] [place], [place], [place] [address]   LABORATORY DIRECTOR: Victory Dakin, M.D., Ph.D   For any questions, please call customer service at [phone]   FREQUENCY:OTHER  Resulting Agency Comment    Specimen source: Plasma  Specimen Collected: 04/16/23    Performed by: Gemma Payor Last Resulted: 04/17/23   Received From: [facility]  Result Received: 04/21/23 16:44    View Encounter    Recent Data from [facility]  Related to [facility] CHEMISTRY  Component 04/16/23 04/09/23 03/12/23 02/12/23 01/08/23 12/11/22   Hep B Surface Ag Negative Negative Negative Negative Negative Negative   Hepatitis B Surface Ab 102  105  -- -- -- --   View all related data     Questions    CONSENT SIGNED: Yes 04/24/2023  BLOOD CONSENT:      N/A  DX:   Patient Active Problem List   Diagnosis    Diabetic foot infection (CMS HCC)  (CMS HCC)    Type 2 diabetes mellitus (CMS HCC)    Cellulitis of foot, left    Uncontrolled hypertension    Obesity    C. 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)    NSTEMI (non-ST elevated myocardial infarction) (CMS HCC)    Hypercholesterolemia     DIABETIC: Yes  CODE STATUS: Full Code  ALLERGIES:  Allergies   Allergen Reactions  Gabapentin Itching    Hydrocodone     Ibuprofen     Vicodin [Hydrocodone-Acetaminophen] Itching     DIET: DIET CARDIAC  ACCESS:  LUA AVG    NEEDLE SIZE(G): 15  WT(KG): 109      EDW: 107.5    UFG(L): 0-2  PRE TX NURSE REPORT FROM/TIME: Genelle Bal RN 1000  TIME OUT TIME/SAFETY CHECKS:  Doralee Albino RN [phone]  HEPATITIS STATUS: Immune  HBSAG   DATE: 04/16/2023       RESULTS: Negative  HBSAB   DATE:  04/16/2023     RESULTS: >10 Immune  MOBILITY: BED    1ST TIME ACUTE:      STAT:     ROUTINE: No MWF     URGENT:  ACUTE ROOM:      BEDSIDE:     ICU:  ISOLATION PRECAUTION:  DIALYSIS:  Yes    AIRBORNE:     CONTACT:      REVERSE:      DROPLET:  SPECIAL CONSIDERATIONS: no HD on Monday 04/23/2023 d/t cardiac cath  TOTAL CHLORINE < 0.1PPM  TIME: 0900        2ND CHECK TIME: 1300  TREATMENT INITIATION: 1017  BATH K/CA:  3K/3.5 Ca       BFR/DFR: 400/700     NA/HCO3: 137/32  ISOLATED UF ONLY: No  MACHINE S/N: [id number]  RO MAIN:  No   PORTABLE S/N: [id number]  ALARM TEST PASS TIME: 0910      BLEACH RESIDUAL NEGATIVE:   Y   MINNICARE RESIDUAL NEGATIVE:  Y   RO/MACHINE LOG COMPLETED:  Y   EXTRACORPOREAL CIRCUIT TESTED FOR INTEGRITY:  Y   ALL CONNECTIONS SECURED:  Y   SALINE LINE DOUBLE CLAMPED:  Y   VENOUS/ARTERIAL PARAMETERS SET:  Y   PRIME GIVEN: 250 MLS  Y   AIR FOAM DETECTOR ENGAGED:  Y   INCAPACITATED NURSE EDUCATION COMPLETED:  Y   MD NOTIFIED AT THE START:  Yes        F L O W S H E E T :     04/24/23 1005   Machine Retail banker P   Machine Number 1   Machine Temp 36 C (96.8 F)   Alarms Set Yes (Pre-Set)   Conductivity 13.8   Independent Conductivity 13.8   Bleach -   Independent pH 7.4   Dialysate Used: premixed hemodialysate (NATURALYTE) 3.43 L;with potassium chloride 3 mEq/L;with calcium chloride 3.5 mEq/L   Dialyzer Used: Nipro 17H   Med Being Verified heparin   Med Verification (1st) Clinician Verification of Medication/Blood   Med Verification (2nd) Second Clinician Verification of Medication/Blood   Additional Pre- Dialysis Documentation   Hepatitis B Status Checked: Negative Surface Antigen;Immunity   Hepatitis B Date Drawn 04/16/23   Pre-Dialysis Safety Checks Yes   Total Chlorine <0.1   Dialysis Prescription   Order Confirmed Yes   Pre Dialysis Vitals   Weight (Pre Dialysis) 109 kg (240 lb 4.8 oz)   Weight Source Bed   Temp 36.1 C (97 F)   BP (Non-Invasive) (!)  155/72   BP Source (Non-Invasive) LL   MAP (Non-Invasive) 110 mmHG   Heart Rate 97   SpO2 99 %  (RA)   Pre Dialysis Assessment   Patient Status Report Received;Stable   Cardiac Pulse Regular  (SR)   Respiratory Lungs Clear   Mental Status Alert   GI Complaints No Complaint   Fluid Assessment Lower Extremity Edema  Lower Extremity Edema Status Mild   Pain Score 0   Patient Report Received From: Genelle Bal RN [phone]   Vascular Access   Was Catheter Placed in Dialysis? No   Access Site Left Arm;Upper   Access Type AV Graft   Access Assessment WDL   Needle Size 15 gauge   Access Patent Yes   Dialysis Consent   Consent Obtained Verbal;Written   Date Consent Obtained 04/24/23   Procedure Time Out   Consent Obtained Verbal   Patient Identification (name and date of birth): Patient Verbally Identified Self;Verified Patient Identification Band   Patient Position High Fowler   Procedure Verified? Yes   Time Out Performed By Doralee Albino RN [phone]      04/24/23 1005   AV Graft Left   No placement date or time found.   Orientation/ Location: Left   Dialysis Access 15 gauge   SITE ASSESSMENT WDL   Bruit Positive   Thrill Positive   DRESSING TYPE Open to Air  (gauze and tape applied per protocol, line secured with hemostat)   Dressing Status Changed/ New  (applied)   Dressing Drainage None      04/24/23 1017 04/24/23 1030 04/24/23 1045   Hemodialysis Monitor   Patient Status Stable  (treatment intiated) Stable  (Cardiology rounding, speaking with pt) Stable  (Pt crying after speaking with cardiology.  She declines talking, prefers a moment to "cry it out".)   Time On 1017  --   --    Vitals and Machine Parameters   BP 150/70 167/84 188/89   Pulse 93 83 94   Blood Flowrate 400 ML/MIN 400 ML/MIN 400 ML/MIN   Arterial Pressure -160 mmHg -160 mmHg -160 mmHg   Venous Pressure 170 mmHg 170 mmHg 170 mmHg   Transmembrane Pressure (TMP) 70 mmHg 70 mmHg 70 mmHg   Ultrafiltration Rate (Dialysis) 130 130 540   Fluids Normal Saline  --   --     IV Fluid Administered Due To:   (prime)  --   --    Fluid Volume 250 mL  --   --    Fluid Goal 0 Kg 0 Kg 1.5 Kg   Actual Fluid Goal 0.5 Kg 0.5 Kg 2 Kg   Actual Fluid Removal 0 mL 15 mL 134 mL   Treatment Total Fluid Removal 0 mL  --   --    Total Blood Product Removed 0 ml  --   --    Total IV Medication Removed 0  --   --    Patient Observed Yes Yes Yes   Additional Hemodialysis Documentation   [place] On   (n/a)  --   --    Dialysis Access Visible Yes Yes Yes   Hemodialysis Line Secure Yes Yes Yes   Machine Serial Number [id number]  --   --    RO Serial Number [id number]  --   --    Alarm Pass Time 0910  --   --    Dialyzer Lot# [id number] exp 11/22/2025  --   --    Tubing Lot# [id number] exp 08/24/2026  --   --    RO/Machine Log Complete Yes  --   --    All Connections Secured Yes  --   --    Venous/Arterial parameters set Yes  --   --    Prime Given Yes; 250 mL  --   --    IT consultant engages  Yes  --   --  Saline Line double clamped  Yes  --   --    Incapacitated Nurse Education Completed  Yes  --   --       04/24/23 1100 04/24/23 1115 04/24/23 1130   Hemodialysis Monitor   Patient Status Stable  (Pt resting calmly.  Denies needs at this time.) Stable  (Pt c/o anxiety, would like med.  Primary nurse messaged.) Stable  (Primary nurse at bedside, administers xanax)   Time On  --   --   --    Vitals and Machine Parameters   BP 181/81 (!) 201/103 (!) 180/91   Pulse 91 93 96   Blood Flowrate 400 ML/MIN 400 ML/MIN 400 ML/MIN   Arterial Pressure -160 mmHg -170 mmHg -170 mmHg   Venous Pressure 170 mmHg 180 mmHg 200 mmHg   Transmembrane Pressure (TMP) 70 mmHg 70 mmHg 70 mmHg   Ultrafiltration Rate (Dialysis) 540 540 540   Fluids  --   --   --    IV Fluid Administered Due To:  --   --   --    Fluid Volume  --   --   --    Fluid Goal 1.5 Kg 1.5 Kg 1.5 Kg   Actual Fluid Goal 2 Kg 2 Kg 2 Kg   Actual Fluid Removal 280 mL 443 mL 504 mL   Treatment Total Fluid Removal  --   --   --    Total Blood Product Removed  --   --    --    Total IV Medication Removed  --   --   --    Patient Observed Yes Yes Yes   Additional Hemodialysis Documentation   [place] On  --   --   --    Dialysis Access Visible Yes Yes Yes   Hemodialysis Line Secure Yes Yes Yes   Machine Serial Number  --   --   --    RO Serial Number  --   --   --    Alarm Pass Time  --   --   --    Dialyzer Lot#  --   --   --    Tubing Lot#  --   --   --    RO/Machine Log Complete  --   --   --    All Connections Secured  --   --   --    Venous/Arterial parameters set  --   --   --    Prime Given  --   --   --    IT consultant engages   --   --   --    Saline Line double clamped   --   --   --    Incapacitated Nurse Education Completed   --   --   --       04/24/23 1145 04/24/23 1200 04/24/23 1215   Hemodialysis Monitor   Patient Status Stable  (Pt denies needs other than "a cup of hot tea".  Speech clear and appropriate.  Therapeutic listening and reassurance provided.) Stable  (Pt talking on cell phone.  No sign of distress.) Stable  (Pt talking on cell phone)   Time On  --   --   --    Vitals and Machine Parameters   BP (!) 196/99 190/57 (!) 185/91   Pulse 94 89 93   Blood Flowrate 400 ML/MIN 400 ML/MIN 400 ML/MIN   Arterial Pressure -170 mmHg -170  mmHg -170 mmHg   Venous Pressure 200 mmHg 190 mmHg 190 mmHg   Transmembrane Pressure (TMP) 60 mmHg 70 mmHg 70 mmHg   Ultrafiltration Rate (Dialysis) 540 760 760   Fluids  --   --   --    IV Fluid Administered Due To:  --   --   --    Fluid Volume  --   --   --    Fluid Goal 1.5 Kg 2 Kg 2 Kg   Actual Fluid Goal 2 Kg 2.5 Kg 2.5 Kg   Actual Fluid Removal 600 mL 746 mL 946 mL   Treatment Total Fluid Removal  --   --   --    Total Blood Product Removed  --   --   --    Total IV Medication Removed  --   --   --    Patient Observed Yes Yes Yes   Additional Hemodialysis Documentation   [place] On  --   --   --    Dialysis Access Visible Yes Yes Yes   Hemodialysis Line Secure Yes Yes Yes   Machine Serial Number  --   --   --    RO Serial  Number  --   --   --    Alarm Pass Time  --   --   --    Dialyzer Lot#  --   --   --    Tubing Lot#  --   --   --    RO/Machine Log Complete  --   --   --    All Connections Secured  --   --   --    Venous/Arterial parameters set  --   --   --    Prime Given  --   --   --    IT consultant engages   --   --   --    Saline Line double clamped   --   --   --    Incapacitated Nurse Education Completed   --   --   --       04/24/23 1230 04/24/23 1245 04/24/23 1300   Hemodialysis Monitor   Patient Status Stable  (Pt texting, no sign of distress) Stable  (Speech clear, pt calm) Stable  (Speech clear and appropriate)   Vitals and Machine Parameters   BP 167/68 (!) 186/91 180/57   Pulse 94 93 94   Blood Flowrate 400 ML/MIN 400 ML/MIN 400 ML/MIN   Arterial Pressure -180 mmHg -180 mmHg -180 mmHg   Venous Pressure 190 mmHg 200 mmHg 200 mmHg   Transmembrane Pressure (TMP) 60 mmHg 60 mmHg 70 mmHg   Ultrafiltration Rate (Dialysis) 760 760 760   Fluid Goal 2 Kg 2 Kg 2 Kg   Actual Fluid Goal 2.5 Kg 2.5 Kg 2.5 Kg   Actual Fluid Removal 1134 mL 1407 mL 1515 mL   Patient Observed Yes Yes Yes   Additional Hemodialysis Documentation   Dialysis Access Visible Yes Yes Yes   Hemodialysis Line Secure Yes Yes Yes      04/24/23 1230 04/24/23 1245 04/24/23 1300   Hemodialysis Monitor   Patient Status Stable  (Pt texting, no sign of distress) Stable  (Speech clear, pt calm) Stable  (Speech clear and appropriate)   Vitals and Machine Parameters   BP 167/68 (!) 186/91 180/57   Pulse 94 93 94   Blood Flowrate 400 ML/MIN 400 ML/MIN 400 ML/MIN  Arterial Pressure -180 mmHg -180 mmHg -180 mmHg   Venous Pressure 190 mmHg 200 mmHg 200 mmHg   Transmembrane Pressure (TMP) 60 mmHg 60 mmHg 70 mmHg   Ultrafiltration Rate (Dialysis) 760 760 760   Fluid Goal 2 Kg 2 Kg 2 Kg   Actual Fluid Goal 2.5 Kg 2.5 Kg 2.5 Kg   Actual Fluid Removal 1134 mL 1407 mL 1515 mL   Patient Observed Yes Yes Yes   Additional Hemodialysis Documentation   Dialysis Access Visible  Yes Yes Yes   Hemodialysis Line Secure Yes Yes Yes      04/24/23 1315 04/24/23 1330 04/24/23 1345   Hemodialysis Monitor   Patient Status Stable  (Pt watching cell phone) Stable  (Pt denies c/o other than hungry, crackers provided.  Primary nurse phoned for scheduled labetalol d/t increased BP) Stable  (primary nurse administers labatelol)   Vitals and Machine Parameters   BP (!) 184/98 (!) 213/70 180/88   Pulse 96 97 97   Blood Flowrate 400 ML/MIN 400 ML/MIN 400 ML/MIN   Arterial Pressure -180 mmHg -180 mmHg -180 mmHg   Venous Pressure 200 mmHg 200 mmHg 200 mmHg   Transmembrane Pressure (TMP) 70 mmHg 70 mmHg 70 mmHg   Ultrafiltration Rate (Dialysis) 760 760 760   Fluid Goal 2 Kg 2 Kg 2 Kg   Actual Fluid Goal 2.5 Kg 2.5 Kg 2.5 Kg   Actual Fluid Removal 1673 mL 1846 mL 2027 mL   Patient Observed Yes Yes Yes   Additional Hemodialysis Documentation   Dialysis Access Visible Yes Yes Yes   Hemodialysis Line Secure Yes Yes Yes      04/24/23 1400 04/24/23 1415 04/24/23 1437   Hemodialysis Monitor   Patient Status Stable  (Pt resting, breathing unlabored) Stable  (end of treatment 1421.  Primary nurse messaged re: BP)  --    Vitals and Machine Parameters   BP 199/86 (!) 221/83  --    Pulse 97 101  --    Blood Flowrate 400 ML/MIN 400 ML/MIN  --    Arterial Pressure -180 mmHg -180 mmHg  --    Venous Pressure 200 mmHg 200 mmHg  --    Transmembrane Pressure (TMP) 70 mmHg 70 mmHg  --    Ultrafiltration Rate (Dialysis) 760 760  --    Fluids  --  Normal Saline  --    IV Fluid Administered Due To:  --  Standard Rinseback  --    Fluid Volume  --  250 mL  --    Fluid Goal 2 Kg 2 Kg  --    Actual Fluid Goal 2.5 Kg 2.5 Kg  --    Actual Fluid Removal 2240 mL 2500 mL  --    Treatment Total Fluid Removal  --  2000 mL  --    Total Blood Product Removed  --  0 ml  --    Total IV Medication Removed  --  0  --    Patient Observed Yes Yes  --    Additional Hemodialysis Documentation   Dialysis Access Visible Yes Yes  --    Hemodialysis Line  Secure Yes Yes  --    Post Dialysis Vitals   Weight (Post Dialysis)  --   --  107 kg (235 lb 14.3 oz)   Weight Source  --   --  Bed   Temp  --   --  36.1 C (97 F)   Temp Source  --   --  Thermoscan   BP (Non-Invasive)  --   --  (!) 151/86   BP Source (Non-Invasive)  --   --  C;LL   RR  --   --  22   End Treatment Kt/V  --   --  1.12   Post Dialysis Assessment   Time Off  --   --  1421   $ Treatment Completed  --   --  Yes   Cardiac  --   --  Pulse Regular;Tachycardia   Respiratory  --   --  Lungs Clear   Post Vascular Assessment  --   --  bruit audible, strong;thrill palpable, strong   Dialyzer Appearance  --   --  Streaked <50%   Dialysis Outcome  --   --  Goal Met   Post Hemodialysis Patient Status  --   --  Report Given;Stable         MEDICATIONS/BLOOD PRODUCTS:  epoetin alfa-epbx (RETACRIT) 10,000 units/mL injection   [[id number]]    Ordered Dose: 10,000 Units Route: Intravenous Frequency: GIVE IN DIALYSIS   Admin Dose: 10,000 Units      Start Date/Time (Original Order): 04/23/23 0505 End Date/Time: No end date specified (ordered for 1 doses)    Start Date/Time (After Last Modification): 04/24/23 0000 First Dose: As Scheduled       Admin Instructions:   Please print Medication Guide for patient at initiation of therapy and every 30 days.  Please document any education provided in Patient Education.  Thank you!           Order Status: Active     Ordering User: Mendel Corning, MD Ordering Date/Time: Mon Apr 23, 2023 2002   Ordering Provider: Mendel Corning, MD Authorizing Provider: Mendel Corning, MD      Order Questions    Question Answer Comment   Is this being prescribed in a patient with cancer (other than MDS)? No                 Original order:  epoetin alfa-epbx (RETACRIT) 10,000 units/mL injection [[id number]]   heparin 1,000 units/mL injection -For DIALYSIS   [[id number]]    Ordered Dose: 3,200 Units Route: Intracatheter Frequency: GIVE IN DIALYSIS   Admin Dose: 3,200 Units      Scheduled Start Date/Time:  04/24/23 0940 End Date/Time: No end date specified (ordered for 1 doses) First Dose: As Scheduled      Admin Instructions:   Heparin 800 units/hr per dialysis order   CAUTION:  "High Alert" Medication.           Order Status: Active     Ordering User: Doralee Albino, RN Ordering Date/Time: Tue Apr 24, 2023 0940   Ordering Provider: Mendel Corning, MD Authorizing Provider: Mendel Corning, MD         Order part of Order Set:  [facility]: HEMODIALYSIS       EDUCATION: Patient: Yes   Others:  Knowledge Base:  Substantial: Yes  Minimal:   None:  Barriers to Learning:         None: Yes  Teaching Method:   Oral: Yes  Written:  Visual:    Hands On/ Demo:  Topic:  Access Care: Yes  S&S of Infection:   Fluid Management: Yes  K+:   Procedural:   Albumin:   Medications: Yes Tx Options:   Transplant:    Diet:   Others:  Patient Response:  Verbalized Understanding:   Demonstrate Understanding:  Teach Back:    Return Demo:   No Evidence for Learning:  Requires Follow Up:   None/N/A:    POST TX NOTES:  2000 mL net fluid removed.  Treatment goals met and well tolerated.    POST TX ACCESS:  AVF/AVG: BLEEDING STOP ART__10__MIN  VEN___7_MIN   +BRUIT/THRILL: Yes  CATHETER LOCKING SOLUTION:  HEPARIN 1000 UNITS/ML:    NA CITRATE:   SALINE FLUSH(10MLS EACH):    CATHFLO:                                                                  VOLUME ART____ML    VEN____ML                                                                  CLAMPED AND CAPPED:    ACCESS FLOW: Good: Yes  Poor:   Positional:   Reversed:   Clotted:  BVP__91.7__L  KT/V_1.12___  NET UF_2000___ML  PRIMARY NURSE REPORT POST TX:    Genelle Bal RN   TIME: [phone]

## 2023-04-24 NOTE — Progress Notes (Signed)
[facility]  [place], [place] [address]    INPATIENT PROGRESS NOTE      [name], [name]  Date of Admission:  04/21/2023  Date of Birth:  [birth year]  Date of Service:  04/24/2023        Assessment/ Plan:         Non ST-Elevation Myocardial Infarction (NSTEMI): Associated with abnormal EKG finding with ST changes in the lateral leads.  Status post left heart catheterization 04/23/2023.  Noted for critically diseased proximal LAD with PCI intervention.  Postprocedure care per Cardiology.  Aspirin 81 mg p.o. daily.  Brilinta 90 mg p.o. b.i.d..      CAD: Chronic.  Continue guideline directed medical therapy with dual antiplatelet therapy as above.  Losartan 25 mg p.o. daily, labetalol 20 mg p.o. t.i.d..  Monitor on telemetry.  Serial EKGs as needed.        End Stage Renal Disease on hemodialysis      Hyperparathyroidism with recent parathyroidectomy (04/17/23)       Hypertension:  Benign.  Controlled.  Current blood pressure 120/47.  Continue labetalol 200 mg p.o. t.i.d..  Continue Cozaar 25 mg p.o. daily.  Adjust as needed.       Diabetes Mellitus II, controlled - glucose 168 but last HA1c on 04/09/23 was 5.8;takes Mounjaro every 7 days at home; FSBS with SSIP; diabetic diet; continue diabetic teaching       Anxiety/Depression:  Stable. 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  Discussed weight loss benefits and strategies to achieve goal weight.        Prophylaxis:  SCD      Disposition:  Yet to be determined.  Pending medical recovery.            Chief Complaint:  Follow-up for chest pain, end-stage renal disease on hemodialysis, hypertension, diabetes mellitus type 2.        Subjective: Pt seen. Admits above.      ROS: Denies fever/chills. No chest pain/SOB. No abdominal pain, nausea, or vomiting.         Today's Physical Exam:  BP (!) 120/47   Pulse 99   Temp 36.8 C (98.2 F)   Resp (!) 22   Ht 1.702 m (5' 7.01")   Wt 109 kg (239 lb 10.2 oz)    LMP 10/24/2014 (Approximate)   SpO2 96%   BMI 37.52 kg/m       General: AAOx3, Obese, No acute distress.   Eyes: Pupils equal and round, reactive to light and accomodation.   HENT:Head atraumatic and normocephalic   Neck: No JVD or thyromegaly or lymphadenopathy   Lungs: Clear to auscultation bilaterally.   Cardiovascular: regular rate and rhythm, S1, S2 normal, no murmur,   Abdomen: Obese, Soft, non-tender, Bowel sounds normal, No hepatosplenomegaly   Extremities: extremities normal, atraumatic, no cyanosis or edema   Skin: Skin warm and dry   Neurologic: Grossly normal   Lymphatics: No lymphadenopathy   Psychiatric: Normal affect and behavior.    Current Medications:  acetaminophen (TYLENOL) tablet, 1,000 mg, Oral, Q6H PRN  ALPRAZolam (XANAX) tablet, 0.25 mg, Oral, Q8H PRN  aspirin chewable tablet 81 mg, 81 mg, Oral, Daily  aspirin chewable tablet 81 mg, 81 mg, Oral, Daily  atorvastatin (LIPITOR) tablet, 40 mg, Oral, QPM  B complex-vitamin C-folic acid (NEPHRO-VITE) tablet, 1 Tablet, Oral, NIGHTLY  calcitriol (ROCALTROL) capsule, 1 mcg, Oral, Daily  calcium carbonate (TUMS)  500mg  (200mg  elemental calcium) chewable tablet, 500 mg, Oral, 3x/day-Meals  cyclobenzaprine (FLEXERIL) tablet, 5 mg, Oral, 3x/day PRN  epoetin alfa-epbx (RETACRIT) 10,000 units/mL injection, 10,000 Units, Intravenous, Give in Dialysis  escitalopram (LEXAPRO) tablet, 10 mg, Oral, NIGHTLY  labetalol (NORMODYNE) tablet, 200 mg, Oral, Q8HRS  losartan (COZAAR) tablet, 25 mg, Oral, Daily after Dinner  magnesium hydroxide (MILK OF MAGNESIA) 400mg  per 5mL oral liquid, 30 mL, Oral, HS PRN  melatonin tablet, 9 mg, Oral, NIGHTLY  nitroGLYCERIN (NITROSTAT) sublingual tablet, 0.4 mg, Sublingual, Q5 Min PRN  NS 250 mL flush bag, , Intravenous, Q1H PRN  NS flush syringe, 10 mL, Intravenous, Q8H  NS flush syringe, 10 mL, Intravenous, Q8HRS  NS flush syringe, 10 mL, Intravenous, Q1H PRN  ondansetron (ZOFRAN ODT) rapid dissolve tablet, 4 mg, Oral, Q6H  PRN  prochlorperazine (COMPAZINE) 5 mg/mL injection, 10 mg, Intravenous, Q6H PRN  promethazine-dextromethorphan (PHENERGAN-DM) 6.25-15 mg per 5 mL oral liquid, 5 mL, Oral, Q6H PRN  ticagrelor (BRILINTA) tablet, 90 mg, Oral, 2x/day  traMADol (ULTRAM) tablet, 50 mg, Oral, Q6H PRN  zolpidem (AMBIEN) tablet, 5 mg, Oral, HS PRN          I/O:  I/O last 24 hours:    Intake/Output Summary (Last 24 hours) at 04/24/2023 0933  Last data filed at 04/23/2023 2312  Gross per 24 hour   Intake --   Output 550 ml   Net -550 ml     I/O current shift:  No intake/output data recorded.      Laboratory Results:    Reviewed:   Results for orders placed or performed during the hospital encounter of 04/21/23 (from the past 24 hour(s))   PTT (PARTIAL THROMBOPLASTIN TIME) - ONCE   Result Value Ref Range    APTT 60.8 (H) 24.0 - 36.5 seconds   INVASIVE CARDIOLOGY PROCEDURE   Result Value Ref Range    Cath EF Quantitative 30 %     Ordered:    Lab orders   No lab orders         Radiological Studies:  Reviewed:    Ordered:  CT ANGIO CHEST FOR PULMONARY EMBOLUS W IV CONTRAST  INVASIVE CARDIOLOGY PROCEDURE    Problem List:  Active Hospital Problems   (*Primary Problem)    Diagnosis    *NSTEMI (non-ST elevated myocardial infarction) (CMS HCC)    Hypercholesterolemia    ESRD on dialysis (CMS HCC)    Obesity     Chronic     Portion control, has already changed diet      Uncontrolled hypertension     Not at goal. Losartan increased already. Will add Amlodipine 5. Goal <120/80      Type 2 diabetes mellitus (CMS HCC)     Chronic     Control much improved - A1C 5.2% in January. Continue current regimen           Linzie Collin, MD, 04/24/2023,  09:33

## 2023-04-24 NOTE — Nurses Notes (Signed)
TR band was deflated per protocol without any complications and covered with Opsite above 2x2, no bleeding or hematoma noted. PT has good sensibility in all extremities and pulses +2 +2.

## 2023-04-24 NOTE — Ancillary Notes (Signed)
Echo completed at bedside. Definity given and administered by myself.      [place], RDCS  04/24/2023, 09:45

## 2023-04-24 NOTE — Progress Notes (Signed)
[facility]  Progress Note  Date/Time: 04/24/2023 09:44  Patient Name: [name]  MRN#: [id number]  DOB: 1966/03/01  Admission Date: 04/21/2023   Consulting Physician: Linzie Collin, MD  Consulting Cardiologist: Dr. Army Melia    Subjective:   Denies chest pain, feels much better today.   S/p DES to prox LAD.   Currently receiving HD.     Physical Exam:     Vitals:    04/24/23 0500 04/24/23 0600 04/24/23 0700 04/24/23 0716   BP: 125/68 111/66 (!) 120/47 (!) 120/47   Pulse: 89 94 100 99   Resp: (!) 21 (!) 22 (!) 22 (!) 22   Temp:    36.8 C (98.2 F)   SpO2: 98% 98% 98% 96%   Weight:       Height:       BMI:             Body mass index is 37.52 kg/m.    Intake and Output Summary (Last 24 hours) at Date Time    Intake/Output Summary (Last 24 hours) at 04/24/2023 0944  Last data filed at 04/23/2023 2312  Gross per 24 hour   Intake --   Output 550 ml   Net -550 ml       Physical Exam  Vitals and nursing note reviewed.   Constitutional:       General: She is not in acute distress.     Appearance: Normal appearance.   HENT:      Head: Normocephalic and atraumatic.      Nose: Nose normal.   Cardiovascular:      Rate and Rhythm: Normal rate and regular rhythm.      Pulses: Intact distal pulses.      Heart sounds: S1 normal and S2 normal. Murmur heard.      Systolic murmur is present with a grade of 2/6 at the upper right sternal border.      Comments: No LE edema.  Pulmonary:      Effort: Pulmonary effort is normal.      Breath sounds: Normal breath sounds.   Skin:     General: Skin is warm and dry.      Capillary Refill: Capillary refill takes less than 2 seconds.      Comments: Right wrist intact, no hematoma. 2+ right radial and palmar arch pulse.    Neurological:      Mental Status: She is alert and oriented to person, place, and time.   Psychiatric:         Mood and Affect: Mood normal.         Behavior: Behavior normal.         Thought Content: Thought content normal.         Judgment: Judgment  normal.              Medications:     Current Facility-Administered Medications   Medication Dose Route Frequency    acetaminophen (TYLENOL) tablet  1,000 mg Oral Q6H PRN    ALPRAZolam (XANAX) tablet  0.25 mg Oral Q8H PRN    aspirin chewable tablet 81 mg  81 mg Oral Daily    atorvastatin (LIPITOR) tablet  40 mg Oral QPM    B complex-vitamin C-folic acid (NEPHRO-VITE) tablet  1 Tablet Oral NIGHTLY    calcitriol (ROCALTROL) capsule  1 mcg Oral Daily    calcium carbonate (TUMS) 500mg  (200mg  elemental calcium) chewable tablet  500 mg Oral 3x/day-Meals  cyclobenzaprine (FLEXERIL) tablet  5 mg Oral 3x/day PRN    epoetin alfa-epbx (RETACRIT) 10,000 units/mL injection  10,000 Units Intravenous Give in Dialysis    escitalopram (LEXAPRO) tablet  10 mg Oral NIGHTLY    heparin 1,000 units/mL injection -For DIALYSIS  3,200 Units Intracatheter Give in Dialysis    labetalol (NORMODYNE) tablet  200 mg Oral Q8HRS    losartan (COZAAR) tablet  25 mg Oral Daily after Dinner    magnesium hydroxide (MILK OF MAGNESIA) 400mg  per 5mL oral liquid  30 mL Oral HS PRN    melatonin tablet  9 mg Oral NIGHTLY    nitroGLYCERIN (NITROSTAT) sublingual tablet  0.4 mg Sublingual Q5 Min PRN    NS 250 mL flush bag   Intravenous Q1H PRN    NS flush syringe  10 mL Intravenous Q8H    NS flush syringe  10 mL Intravenous Q8HRS    NS flush syringe  10 mL Intravenous Q1H PRN    ondansetron (ZOFRAN ODT) rapid dissolve tablet  4 mg Oral Q6H PRN    prochlorperazine (COMPAZINE) 5 mg/mL injection  10 mg Intravenous Q6H PRN    promethazine-dextromethorphan (PHENERGAN-DM) 6.25-15 mg per 5 mL oral liquid  5 mL Oral Q6H PRN    ticagrelor (BRILINTA) tablet  90 mg Oral 2x/day    traMADol (ULTRAM) tablet  50 mg Oral Q6H PRN    zolpidem (AMBIEN) tablet  5 mg Oral HS PRN     Prior to Admission medications    Medication Sig Start Date End Date Taking? Authorizing Provider   albuterol sulfate (PROVENTIL OR VENTOLIN OR PROAIR) 90 mcg/actuation Inhalation HFA Aerosol Inhaler  Take 2 Puffs by inhalation Every 4 hours as needed  Patient not taking: Reported on 04/09/2023 08/22/19   Daryl Eastern, MD   ALPRAZolam Prudy Feeler) 0.25 mg Oral Tablet Take 1 Tablet (0.25 mg total) by mouth Once per day as needed for Anxiety 06/03/20   Provider, Historical   atorvastatin (LIPITOR) 20 mg Oral Tablet Take 1 Tablet (20 mg total) by mouth Every evening    Provider, Historical   cyclobenzaprine (FLEXERIL) 5 mg Oral Tablet Take 1 Tablet (5 mg total) by mouth Three times a day as needed for Muscle spasms    Provider, Historical   diatrizoate meg-diatrizoat sod 66-10 % Oral Solution Mix entire bottle of Gastrografin/Gastroview with 32 oz of your choice Non Carbonated beverage (Water, Juice, Gatorade, etc). Complete entire mixture 1 hour prior to exam arrival.  Patient not taking: Reported on 04/09/2023 11/02/22   Sheliah Plane, FNP-BC   escitalopram oxalate (LEXAPRO) 10 mg Oral Tablet Take 1 Tablet (10 mg total) by mouth Every night    Provider, Historical   Fish Oil-Omega-3 Fatty Acids 360-1,200 mg Oral Capsule Take 2 Capsules by mouth Twice daily    Provider, Historical   fluticasone (FLONASE) 50 mcg/actuation Nasal Spray, Suspension Administer 1 Spray into each nostril Once per day as needed (Allergy symptoms (nasal congestion/ runny nose))    Provider, Historical   iron sucrose (VENOFER) 50 mg iron/2.5 mL Intravenous Solution 2.5 mL (50 mg total) 08/22/21   Provider, Historical   labetaloL (NORMODYNE) 200 mg Oral Tablet Take 1 Tablet (200 mg total) by mouth Every 8 hours for 30 days Indications: Hold for SBP<120 or HR<55 05/26/21 10/13/21  Eloisa Northern, MD   loperamide (IMODIUM) 2 mg Oral Capsule Take 2 Capsules (4 mg total) by mouth Every 6 hours as needed (Diarrhea)    Provider, Historical   loratadine (CLARITIN)  10 mg Oral Tablet Take 1 Tablet (10 mg total) by mouth Once per day as needed (Allergies)    Provider, Historical   losartan (COZAAR) 25 mg Oral Tablet Take 1 Tablet (25 mg total) by mouth Once a day for  30 days  Patient taking differently: Take 1 Tablet (25 mg total) by mouth Take in the evening on non-dialysis days (Sun/Tues/Thurs/Sat); does not take on dialysis days 03/30/20 04/09/23  Wandra Feinstein, MD   [facility] 7.5 mg/0.5 mL Subcutaneous Pen Injector Inject 0.5 mL (7.5 mg total) under the skin Every 7 days 01/23/23   Provider, Historical   omega-3-DHA-EPA-fish oil (FISH OIL) 1,000 mg (120 mg-180 mg) Oral Capsule Take by mouth 09/30/21   Provider, Historical   promethazine-dextromethorphan (PHENERGAN-DM) 6.25-15 mg/5 mL Oral Syrup TAKE 5 ML BY MOUTH 4 TIMES DAILY AS NEEDED FOR COUGH    Provider, Historical   sevelamer carbonate (RENVELA) 800 mg Oral Tablet Take 2 Tablets (1,600 mg total) by mouth 03/08/21   Provider, Historical   traMADoL (ULTRAM) 50 mg Oral Tablet Take 1 Tablet (50 mg total) by mouth Every 6 hours as needed for Pain 10/28/20   Myrlene Broker, MD   TRIPHROCAPS 1 mg Oral Capsule Take 1 Capsule by mouth Every night    Provider, Historical   vitamin E 100 unit Oral Capsule Take 1 Capsule (100 Units total) by mouth Twice daily    Provider, Historical   vitamin E, dl, acetate, (AQUASOL E) 45 mg (100 unit) Oral Capsule Take by mouth  Patient not taking: Reported on 04/09/2023 09/30/21   Provider, Historical   calcitrioL (ROCALTROL) 0.25 mcg Oral Capsule Take 1 Capsule (0.25 mcg total) by mouth 08/03/20 04/21/23  Provider, Historical   cinacalcet (SENSIPAR) 60 mg Oral Tablet TAKE 1 TABLET BY MOUTH 3 TIMES A WEEK AS DIRECTED (MON, WED, FRI) AFTER DIALYSIS WITH AFTERNOON OR EVENING MEAL 01/25/23 04/21/23  Provider, Historical   JANUVIA 50 mg Oral Tablet Take 1 Tablet (50 mg total) by mouth Every morning  Patient not taking: Reported on 04/09/2023 04/19/20 04/21/23  Provider, Historical       Labs Reviewed:     Recent Labs     04/21/23  1739 04/22/23  0335 04/23/23  0702   SODIUM 141 139 139   POTASSIUM 4.3 4.3 4.6   CO2 30 29 27    BUN 20 24 41*   CREATININE 5.12* 5.53* 7.25*   GLUCOSE 168* 100 103     Recent Labs      04/21/23  1739   MAGNESIUM 2.1     Recent Labs     04/21/23  1739 04/21/23  2347 04/22/23  0335 04/23/23  0702   WBC 6.3 7.5 8.4 7.2   HGB 10.0* 9.5* 9.8* 8.9*   HCT 29.7* 28.2* 29.4* 27.6*   PLTCNT 183 188 183 181     Recent Labs     04/21/23  1739   AST 31   ALT 10     Recent Labs     04/22/23  0335 04/22/23  1028 04/22/23  1720 04/23/23  0055 04/23/23  0702 04/23/23  1410   APTT 44.0* 54.5* 34.1 64.9* 78.8* 60.8*     No results found for this encounter  Recent Labs     04/21/23  1739   TSH 0.022*     Recent Results (from the past 11914 hour(s))   LIPID PANEL    Collection Time: 04/22/23  3:35 AM   Result Value  CHOLESTEROL  107    HDL CHOL 37 (L)    TRIGLYCERIDES 106    LDL CALC 50    VLDL CALC 15    NON-HDL 70    CHOL/HDL RATIO 2.9         Personally reviewed by Gillermina Phy, APRN    Rads:     Results for orders placed or performed during the hospital encounter of 04/21/23 (from the past 72 hour(s))   CT ANGIO CHEST FOR PULMONARY EMBOLUS W IV CONTRAST     Status: None    Narrative    PROCEDURE DESCRIPTION: CT ANGIO CHEST FOR PULMONARY EMBOLUS W IV CONTRAST    CTA chest was performed to evaluate for pulmonary embolism with 3D/MIP reconstructions generated.    CLINICAL INDICATION: PE eval    COMPARISON: CT chest on pelvis dated November 07, 2022      FINDINGS: Thoracic kyphosis and degenerative changes of the spine are noted. There is abnormal subcutaneous air and fluid at the anterior neck extending from the thyroid to the subcutaneous tissues.    The left kidney is absent. The thoracic aorta shows no dissection or other acute abnormality. There is no pulmonary embolus. The heart is enlarged and there is coronary artery calcification. Suspect minor atelectasis at the lung bases. There is no pleural effusion.      Impression    1. No pulmonary embolus.  2. Abnormal fluid and air in the anterior neck extending from the thyroid fossa to the edge of the upper mediastinum. Superior most extent of this is not  visualized.  The Epic database indicates that the patient has undergone subtotal parathyroidectomy on April 17, 2023. Findings in the CT likely reflect postoperative appearance. Infection or inflammation is not excluded based on imaging and clinical correlation recommended. Recommend follow-up to show resolution. If needed, CT soft tissue neck with IV contrast can BE obtained for additional assessment.            The CT exam was performed using one or more of the following dose reduction techniques: Automated exposure control, adjustment of the mA and/or kV according to the patient's size, or use of iterative reconstruction technique.        Radiologist location ID: [id number]         Personally reviewed by Gillermina Phy, APRN    Cardiovascular Workup:     Echo 10/18/20  Technically difficult due to patient body habitus.  The left ventricular ejection fraction by visual assessment is estimated to be 55-60%.  Mildly calcified aortic valve leaflets --especially the right coronary cusp.  There is a small mobile density on the aortic valve seen mainly in parasternal long axis views. No aortic valve stenosis or regurgitation.  The study images were of technically adequate quality.  Transthoracic complete echo with contrast, 2D, spectral and tissue Doppler, color flow Doppler, M-mode.  Concentric remodeling.  Left ventricular diastolic parameters are normal.  The mitral valve is not well visualized.  No evidence of mitral stenosis.  There is mild mitral regurgitation.  Trace aortic regurgitation present.  The pulmonic valve is normal.  No significant pulmonic valve regurgitation present.  Trileaflet aortic valve.  There is a small mobile density on the aortic valve.  The interatrial septum is normal in appearance.  Normal IVC size with <50% inspiratory collapse (estimated RA pressure: 8 mmHg).  The aortic root is of normal size.  Normal pericardium with no pericardial effusion.     Echo 09/14/21  Normal left ventricular  size.The left ventricular ejection fraction by visual assessment is estimated to be 60-65%.  Trace mitral regurgitation present.  Trace tricuspid regurgitation present.  There is mild aortic stenosis, mean PG 11 mmHg.     Stress Echo 09/15/22  This stress test was normal.      EKG 04/21/23  Sinus tachycardia  Anterior infarct  Marked ST abnormality, possible lateral subendocardial injury     LEFT HEART CATHETERIZATION/PCI OF THE PROXIMAL LAD April 23, 2023:  Successful angioplasty and stenting of the ostial to mid LAD.    Moderate to severe aortic valvular stenosis suggested with a peak to peak aortic valve gradient of 38 mmHg.  Severely reduced LV global systolic function with an EF of 30% and severely elevated LVEDP of 30.      Echo 04/24/2023  Normal left ventricular size.  Left ventricular systolic function is normal.  The left ventricular ejection fraction by visual assessment is estimated to be 65-70%.  Left ventricular diastolic parameters are normal.  Normal right ventricular systolic function.  There is moderate aortic stenosis.     Consider ordering a CT Heart Calcium, to determine aortic valve calcium score for further risk stratification.    EKG 04/24/2023  NS, resolution of ST depression lateral leads, twave inversions anterior leads.     Telemetry strips: sinus    Personally reviewed by Gillermina Phy, APRN      Problem List:   Principal Problem:    NSTEMI (non-ST elevated myocardial infarction) (CMS HCC)  Active Problems:    Type 2 diabetes mellitus (CMS HCC)    Uncontrolled hypertension    Obesity    ESRD on dialysis (CMS [facility])    Hypercholesterolemia       Assessment:     [name] is 57 y.o. female with     NSTEMI, Type I : Troponin 760/875/1138/1371/1210. EKG showing ST abnormality in lateral leads. left heart catheterization on April 23, 2023 shows critically diseased proximal LAD which was stented. LVEDP of 30 and left ventricular ejection fraction of 30% on LV angiography during catheterization  April 23, 2023.Echo today shows EF 65-70%  Aortic stenosis: mild aortic stenosis reported on echo in December of 2022. Moderate to severe aortic valve stenosis suggested with a peak to peak aortic valve gradient of 38 mmhg by catheterization April 23, 2023. Echo 04/24/2023 moderate AS. Denies any shortness of breath, orthopnea or LE edema prior to presentations.   HTN:  HLD: 04/21/23 FLP Total 107 Tri 106 HDL 37 LDL 50. On statin prior to admission.   DM  Renal CA: s/p left radical nephrectomy 2021  END STAGE RENAL DISEASE on HD  Status post parathyroidectomy, July of 2024  Obesity: BMI 37.66  Never smoker    Plan:     - Continue DAPT (aspirin/brilinta) and statin.   - Continue labetalol 200 mg Q8H, losartan 25 mg daily. (Home meds)  - Discussed importance of medication compliance.   - Recommend cardiac rehab.  - SL nitro PRN for chest pain.   - Further work up of aortic stenosis as outpatient, currently asymptomatic and EF 65-70%.   - Follow up with cardiology outpatient in 1-2 weeks.       Thank you for allowing [place] to participate in the care of your patient. Please feel free to contact us if further questions arise.    Gillermina Phy, APRN  [facility]    I independently of the faculty provider spent a total of (30) minutes in direct  care of this patient including initial evaluation, review of laboratory, radiology, diagnostic studies, review of medical record, order entry and coordination of care.    Portions of this note may be dictated using voice recognition software or a dictation service. Variances in spelling and vocabulary are possible and unintentional. Not all errors are caught/corrected. Please notify the Thereasa Parkin if any discrepancies are noted, or if the meaning of any statement is not clear.     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  Follow up with me in 1-2 weeks.  We will further evaluate the moderate AS.  Prior to symptoms of the non ST elevation myocardial infarction it does not appear that she was symptomatic from the moderate AS.  We will monitor closely for symptoms of worsening AS as an outpatient.  Her fluid status will be watched carefully we including weights daily since the END STAGE RENAL DISEASE may complicate evaluation for any heart failure signs or symptoms associated with the AS

## 2023-04-24 NOTE — Care Plan (Signed)
Problem: Adult Inpatient Plan of Care  Goal: Plan of Care Review  Outcome: Ongoing (see interventions/notes)  Goal: Patient-Specific Goal (Individualized)  Outcome: Ongoing (see interventions/notes)  Goal: Absence of Hospital-Acquired Illness or Injury  Outcome: Ongoing (see interventions/notes)  Intervention: Identify and Manage Fall Risk  Recent Flowsheet Documentation  Taken 04/24/2023 0800 by Genelle Bal, RN  Safety Promotion/Fall Prevention:   nonskid shoes/slippers when out of bed   safety round/check completed  Intervention: Prevent Skin Injury  Recent Flowsheet Documentation  Taken 04/24/2023 0800 by Genelle Bal, RN  Body Position: supine, head elevated  Skin Protection: adhesive use limited  Intervention: Prevent Infection  Recent Flowsheet Documentation  Taken 04/24/2023 0800 by Genelle Bal, RN  Infection Prevention:   rest/sleep promoted   promote handwashing  Goal: Optimal Comfort and Wellbeing  Outcome: Ongoing (see interventions/notes)  Intervention: Provide Person-Centered Care  Recent Flowsheet Documentation  Taken 04/24/2023 0800 by Genelle Bal, RN  Trust Relationship/Rapport:   care explained   choices provided   emotional support provided   empathic listening provided   questions answered   questions encouraged   reassurance provided   thoughts/feelings acknowledged  Goal: Rounds/Family Conference  Outcome: Ongoing (see interventions/notes)     Problem: Pain Acute  Goal: Optimal Pain Control and Function  Outcome: Ongoing (see interventions/notes)  Intervention: Prevent or Manage Pain  Recent Flowsheet Documentation  Taken 04/24/2023 0800 by Genelle Bal, RN  Sensory Stimulation Regulation: quiet environment promoted  Sleep/Rest Enhancement: awakenings minimized  Medication Review/Management: medications reviewed  Intervention: Optimize Psychosocial Wellbeing  Recent Flowsheet Documentation  Taken 04/24/2023 0800 by Genelle Bal, RN  Spiritual Activities Assistance: hope  instilled  Diversional Activities:   television   smartphone  Supportive Measures: active listening utilized     Problem: Chest Pain  Goal: Resolution of Chest Pain Symptoms  Outcome: Ongoing (see interventions/notes)

## 2023-04-24 NOTE — Progress Notes (Signed)
HD delayed from yesterday until today because of LHC yesterday,. Vital signs and labs reviewed. BP lower this morning. Hgb dropped. Calcium lower.   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   Anemia secondary to CKD and possible bleeding. Retacrit on HD today.   Recent 3.5 gland surgical parathyroidectomy. Calcium has been variable initially elevated on admission and most recently low. Binder changed to TUms. Oral Calcitriol restarted 1.0 mcg daily - first dose now.   Will follow

## 2023-04-25 LAB — CBC
HCT: 26.3 % — ABNORMAL LOW (ref 34.8–46.0)
HGB: 8.9 g/dL — ABNORMAL LOW (ref 11.5–16.0)
MCH: 34.1 pg — ABNORMAL HIGH (ref 26.0–32.0)
MCHC: 33.8 g/dL (ref 31.0–35.5)
MCV: 100.8 fL — ABNORMAL HIGH (ref 78.0–100.0)
MPV: 10.4 fL (ref 8.7–12.5)
PLATELETS: 211 10*3/uL (ref 150–400)
RBC: 2.61 10*6/uL — ABNORMAL LOW (ref 3.85–5.22)
RDW-CV: 15.6 % — ABNORMAL HIGH (ref 11.5–15.5)
WBC: 6.2 10*3/uL (ref 3.7–11.0)

## 2023-04-25 LAB — RENAL FUNCTION PANEL
ALBUMIN: 2.8 g/dL — ABNORMAL LOW (ref 3.5–5.0)
ANION GAP: 8 mmol/L (ref 4–13)
BUN/CREA RATIO: 4 — ABNORMAL LOW (ref 6–22)
BUN: 18 mg/dL (ref 8–25)
CALCIUM: 8.4 mg/dL — ABNORMAL LOW (ref 8.6–10.2)
CHLORIDE: 102 mmol/L (ref 96–111)
CO2 TOTAL: 25 mmol/L (ref 22–30)
CREATININE: 4.36 mg/dL — ABNORMAL HIGH (ref 0.60–1.05)
ESTIMATED GFR - FEMALE: 11 mL/min/BSA — ABNORMAL LOW (ref >=60)
GLUCOSE: 94 mg/dL (ref 65–125)
PHOSPHORUS: 3.7 mg/dL (ref 2.4–4.7)
POTASSIUM: 4.1 mmol/L (ref 3.5–5.1)
SODIUM: 135 mmol/L — ABNORMAL LOW (ref 136–145)

## 2023-04-25 MED ORDER — ATORVASTATIN 40 MG TABLET
40.0000 mg | ORAL_TABLET | Freq: Every evening | ORAL | 0 refills | Status: DC
Start: 2023-04-25 — End: 2023-05-31

## 2023-04-25 MED ORDER — EPOETIN ALFA-EPBX 10,000 UNIT/ML INJECTION SOLUTION
10000.0000 [IU] | INTRAMUSCULAR | Status: AC
Start: 2023-04-25 — End: 2023-04-25
  Administered 2023-04-25: 10000 [IU] via INTRAVENOUS
  Filled 2023-04-25: qty 1

## 2023-04-25 MED ORDER — NITROGLYCERIN 0.4 MG SUBLINGUAL TABLET
0.4000 mg | SUBLINGUAL_TABLET | SUBLINGUAL | 0 refills | Status: AC | PRN
Start: 2023-04-25 — End: ?

## 2023-04-25 MED ORDER — HEPARIN (PORCINE) 1,000 UNIT/ML INJECTION FOR DIALYSIS
3200.0000 [IU] | INTRAMUSCULAR | Status: AC
Start: 2023-04-25 — End: 2023-04-25
  Administered 2023-04-25: 3200 [IU] via INTRAVENOUS

## 2023-04-25 MED ORDER — LEVOCARNITINE 200 MG/ML INTRAVENOUS SOLUTION
32.0000 mg/kg | INTRAVENOUS | Status: AC
Start: 2023-04-25 — End: 2023-04-25
  Administered 2023-04-25: 1980 mg via INTRAVENOUS
  Filled 2023-04-25: qty 10

## 2023-04-25 MED ORDER — CALCITRIOL 0.5 MCG CAPSULE
1.0000 ug | ORAL_CAPSULE | Freq: Every day | ORAL | 0 refills | Status: AC
Start: 2023-04-26 — End: 2023-05-31

## 2023-04-25 MED ORDER — LOPERAMIDE 2 MG CAPSULE
2.0000 mg | ORAL_CAPSULE | ORAL | Status: DC | PRN
Start: 2023-04-25 — End: 2023-04-25
  Administered 2023-04-25 (×2): 2 mg via ORAL
  Filled 2023-04-25 (×2): qty 1

## 2023-04-25 MED ORDER — ZOLPIDEM 5 MG TABLET
5.0000 mg | ORAL_TABLET | Freq: Every evening | ORAL | 0 refills | Status: AC | PRN
Start: 2023-04-25 — End: 2023-05-31

## 2023-04-25 MED ORDER — HYDRALAZINE 10 MG TABLET
10.0000 mg | ORAL_TABLET | Freq: Three times a day (TID) | ORAL | 0 refills | Status: DC
Start: 2023-04-25 — End: 2023-05-08

## 2023-04-25 MED ORDER — ASPIRIN 81 MG CHEWABLE TABLET
81.0000 mg | CHEWABLE_TABLET | Freq: Every day | ORAL | 0 refills | Status: AC
Start: 2023-04-26 — End: 2023-05-31

## 2023-04-25 MED ORDER — TICAGRELOR 90 MG TABLET
90.0000 mg | ORAL_TABLET | Freq: Two times a day (BID) | ORAL | 0 refills | Status: DC
Start: 2023-04-25 — End: 2023-05-21

## 2023-04-25 NOTE — Care Plan (Signed)
Assessment:   Pt is a 57 yo F  admitted for chest pain. Found to have NSTEMI. Pt noted to have recent parathyroidectomy 5 days prior to admission. Pt had HD MWF. Pt underwent cath on 7/29 with stent to the proximal LAD.  Pt lives with her husband and children in a 1 story house with no STE. Pt was IND with ADLs and mobility. No AE used. Pt was IND with supine>sit. Pt stood and ambulated 300+ ft with IND. Pt returned to supine with IND. Pt is at baseline re: ADLs and does not require acute OT at this time. Recommend discharge to home.  Goals:   No goals set.    Plan:     D/c patient.    D/C PLANS:  Recommend discharge to home    OT Recommendations for Nursing:  Encourage pt to complete ADLs as able; ambulate pt to the bathroom with IND; Up to chair for all meals    The risks/benefits of therapy have been discussed with the patient and he/she is in agreement with the established plan of care.     Therapist :     Malena Peer, OTD, OTR/L    Time may include review of chart notes, obtaining patient's functional history from patient/family/medical staff/case management/ancillary personnel, collaboration on findings and treatment options (with the above mentioned individuals), re-assessment, and acute care rehabilitation.

## 2023-04-25 NOTE — OT Evaluation (Signed)
[name]  Rehabilitation Services  Occupational Therapy Initial Evaluation - Inpatient      Patient Name: [name]  Date of Birth: 09-May-1966  Height: 170.2 cm (5' 7.01")  Weight: 107 kg (235 lb 7.2 oz)  Room/Bed: 211/A  Payor: PEIA / Plan: PEIA/UMR / Product Type: Non Managed Care /     Encounter Date: 04/21/2023  4:57 PM  Inpatient Admission Date: 04/21/2023    Admitting Diagnosis:  NSTEMI (non-ST elevated myocardial infarction) (CMS HCC) [I21.4]  Patient Active Problem List   Diagnosis    Diabetic foot infection (CMS HCC)  (CMS HCC)    Type 2 diabetes mellitus (CMS HCC)    Cellulitis of foot, left    Uncontrolled hypertension    Obesity    C. 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 Found to have NSTEMI. Pt noted to have recent parathyroidectomy 5 days prior to admission. Pt had HD MWF. Pt underwent cath on 7/29 with stent to the proximal LAD.    Past Medical Hx:    Past Medical History:   Diagnosis Date    Anxiety     Arthritis     hips    Beta blocker prescribed for left ventricular systolic dysfunction     Labetalol     Cancer (CMS HCC) 2019, 2021    kidney, 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    Constipation     COVID 07/2019     Depression     Diabetes mellitus (CMS HCC)     Diabetes mellitus, type 2 (CMS HCC)     Diarrhea     Dyspnea on exertion     ESRD (end stage renal disease) (CMS HCC)     Essential hypertension     Exercise intolerance     Fatty liver     Fluid overload 08/21/21    Recent hospitalization for fluid overload, HD initiated 10/18/20    Headache(784.0)     Hemodialysis patient (CMS HCC)     HD Monday-Wednesday-Friday    History of kidney disease     CKD-Stage 5-HD initiated 10/18/20    History of nephrectomy 03/24/2020    L nephrectomy    Hx of echocardiogram 10/18/2020    Technically difficult d/t pt. body habitus; Left vent. EF est. 55-60%; mildy calcified aortic vlave leaflets-especially right coroanry cusp; Small mobile density on aortic valve-no aortic valve stenosis or regurg; Concentric remodeling; mild MR; Trace AR; Trileaflet aortic vlave; Nl IVC size wiht <50%  insp. collapse;       Hyperlipidemia     Hyperparathyroidism (CMS HCC)     Hyperthyroidism     Old records states hyperthyroidism, patient denies, states has hyperparathyroidism    Irritable bowel syndrome     Macular edema     Morbid obesity (CMS HCC)     Obesity     Panic attack     Peripheral edema     ankles/feet    Peripheral neuropathy     bilateral feet    Pneumonia 07/2019; 09/2020    COVID Pneumonia 07/2019; fluid overload/pneumonia with recent hosp. 09/2020    Shortness of breath     SOB after climbing 8 steps     Type 2 diabetes mellitus (CMS HCC)     Wears glasses     reading    White coat hypertension          Past Surgical Hx:    Past Surgical History:   Procedure Laterality Date    AV FISTULA PLACEMENT Left 08/11/2020    LUE brachio cephalic fistula creation    ECTOPIC PREGNANCY SURGERY  05/24/1998    HX CATARACT REMOVAL Bilateral 2016    HX CHOLECYSTECTOMY  09/10/96    HX HAND SURGERY Right 2017    Trigger finger release 3rd finger    HX OTHER Right 10/18/2020    tunneled dialysis catheter    HX PELVIC LAPAROSCOPY  05/24/98    Ectopic  pregnancy    HX SHOULDER SURGERY Left 2017    Procedure for frozen shoulder    HX TUBAL LIGATION  09/10/2012    Filshie Clips    HYMENECTOMY  1996    KIDNEY SURGERY  10/24/17; 03/24/20    LAPAROSCOPIC NEPHRECTOMY, HAND ASSISTED Left 03/24/2020    Radical    LAPAROSCOPIC PARTIAL NEPHRECTOMY Left 10/24/2017    hand-assisted    RENAL BIOPSY, PERCUTANEOUS  03/08/2020    clear cell renal cell carcinoma         Past Social Hx:       Social History     Social History Narrative    Not on file      [phone]   History Minimal review of history related to current functional performance Expanded review of physical, cognitive or psychosocial history related to current functional performance Extensive review of physical, cognitive or psychosocial history related to current functional performance   Examination of Body Systems Addressing 1-3 performance deficits Addressing a total of 3-5 performance deficits Addressing a total of 5 or more performance deficits   Clinical Presentation Modification of tasks not necessary to complete the evaluation Min-moderate modification of tasks or assistance necessary to complete the evaluation Significant modification of tasks or assistance necessary to complete the evaluation   Typical Face-to-Face Time (minutes) 30 45 60   Clinical Decision Making (Complexity) Low Moderate High     Subjective/Objective:     SUBJECTIVE:  Pt stated, " I've been unplugging this stuff and going to the bathroom "     Pain Scale (0-10):  0 /10    PRECAUTIONS:  None    PRIOR LEVEL OF FUNCTION:  Pt lives with her husband and children in a 1 story house with no STE. Pt was IND with ADLs and mobility. No AE used.    COMMUNICATION SKILLS:  Good, able to make needs known    COGNITION:     Memory:  Alert and oriented x 4. Intact STM & LTM  Follows Directions:   Follows 2-step commands      Safety Awareness:  Good, aware of safety concerns    ADL SKILLS:     UE Bathing: IND  UE Dressing IND  LE Bathing: IND  LE  Dressing: IND  Hygiene: IND      Eating:  Independent    LEISURE INTERESTS / SKILLS:  Enjoys TV    ADAPTIVE EQUIPMENT:  None    FUNCTIONAL MOBILITY: Pt was IND with supine>sit. Pt stood and ambulated 300+ ft with IND. Pt returned to supine with IND.     BALANCE:   Sitting:  Static: Good   Dynamic:  Good      Standing: Static: Good      Dynamic:  Good     ROM:   RUE:  WFL   LUE:  WFL     STRENGTH:   RUE:   4/5   LUE: 4/5    COORDINATION:    Fine Motor:  RUE: Good  LUE:  Good     Gross Motor:  RUE: Good  LUE:   Good      ENDURANCE / ACTIVITY TOLERANCE: Fair    VISUAL / PERCEPTUAL SKILLS:  Intact    SENSATION:     Intact to light touch     PROPRIOCEPTION:  Intact                           Assessment:   Pt is a 57 yo F  admitted for chest pain. Found to have NSTEMI. Pt noted to have recent parathyroidectomy 5 days prior to admission. Pt had HD MWF. Pt underwent cath on 7/29 with stent to the proximal LAD.  Pt lives with her husband and children in a 1 story house with no STE. Pt was IND with ADLs and mobility. No AE used. Pt was IND with supine>sit. Pt stood and ambulated 300+ ft with IND. Pt returned to supine with IND. Pt is at baseline re: ADLs and does not require acute OT at this time. Recommend discharge to home.  Goals:   No goals set.    Plan:     D/c patient.    D/C PLANS:  Recommend discharge to home    OT Recommendations for Nursing:  Encourage pt to complete ADLs as able; ambulate pt to the bathroom with IND; Up to chair for all meals    The risks/benefits of therapy have been discussed with the patient and he/she is in agreement with the established plan of care.     Therapist :     Malena Peer, OTD, OTR/L    Time may include review of chart notes, obtaining patient's functional history from patient/family/medical staff/case management/ancillary personnel, collaboration on findings and treatment options (with the above mentioned individuals), re-assessment, and acute care rehabilitation.

## 2023-04-25 NOTE — PT Evaluation (Signed)
[facility]  [place], [place] [address]  Rehabilitation Services  Physical Therapy Initial Evaluation        Patient Name: [name]  Date of Birth: 1965-10-07  Height: 170.2 cm (5' 7.01")  Weight: 107 kg (235 lb 7.2 oz)  Room/Bed: 211/A  Payor: PEIA / Plan: PEIA/UMR / Product Type: Non Managed Care /     Date/Time of Admission: 04/21/2023  4:57 PM  Admitting Diagnosis:  NSTEMI (non-ST elevated myocardial infarction) (CMS HCC) [I21.4]  Encounter Date: 04/25/2023    Encounter Start Date:  04/21/2023  Inpatient Admission Date: 04/21/2023    Patient Active Problem List   Diagnosis    Diabetic foot infection (CMS HCC)  (CMS HCC)    Type 2 diabetes mellitus (CMS HCC)    Cellulitis of foot, left    Uncontrolled hypertension    Obesity    C. 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     Past Medical Hx:    Past Medical History:   Diagnosis Date    Anxiety     Arthritis     hips    Beta blocker prescribed for left ventricular systolic dysfunction     Labetalol     Cancer (CMS HCC) 2019, 2021    kidney, 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    Constipation     COVID 07/2019    Depression     Diabetes mellitus (CMS HCC)     Diabetes  mellitus, type 2 (CMS HCC)     Diarrhea     Dyspnea on exertion     ESRD (end stage renal disease) (CMS HCC)     Essential hypertension     Exercise intolerance     Fatty liver     Fluid overload 08/21/21    Recent hospitalization for fluid overload, HD initiated 10/18/20    Headache(784.0)     Hemodialysis patient (CMS HCC)     HD Monday-Wednesday-Friday    History of kidney disease     CKD-Stage 5-HD initiated 10/18/20    History of nephrectomy 03/24/2020    L nephrectomy    Hx of echocardiogram 10/18/2020    Technically difficult d/t pt. body habitus; Left vent. EF est. 55-60%; mildy calcified aortic vlave leaflets-especially right coroanry cusp; Small mobile density on aortic valve-no aortic valve stenosis or regurg; Concentric remodeling; mild MR; Trace AR; Trileaflet aortic vlave; Nl IVC size wiht <50% insp. collapse;       Hyperlipidemia  Hyperparathyroidism (CMS HCC)     Hyperthyroidism     Old records states hyperthyroidism, patient denies, states has hyperparathyroidism    Irritable bowel syndrome     Macular edema     Morbid obesity (CMS HCC)     Obesity     Panic attack     Peripheral edema     ankles/feet    Peripheral neuropathy     bilateral feet    Pneumonia 07/2019; 09/2020    COVID Pneumonia 07/2019; fluid overload/pneumonia with recent hosp. 09/2020    Shortness of breath     SOB after climbing 8 steps     Type 2 diabetes mellitus (CMS HCC)     Wears glasses     reading    White coat hypertension          Past Surgical Hx:    Past Surgical History:   Procedure Laterality Date    AV FISTULA PLACEMENT Left 08/11/2020    LUE brachio cephalic fistula creation    ECTOPIC PREGNANCY SURGERY  05/24/1998    HX CATARACT REMOVAL Bilateral 2016    HX CHOLECYSTECTOMY  09/10/96    HX HAND SURGERY Right 2017    Trigger finger release 3rd finger    HX OTHER Right 10/18/2020    tunneled dialysis catheter    HX PELVIC LAPAROSCOPY  05/24/98    Ectopic pregnancy    HX SHOULDER SURGERY Left 2017    Procedure for frozen  shoulder    HX TUBAL LIGATION  09/10/2012    Filshie Clips    HYMENECTOMY  1996    KIDNEY SURGERY  10/24/17; 03/24/20    LAPAROSCOPIC NEPHRECTOMY, HAND ASSISTED Left 03/24/2020    Radical    LAPAROSCOPIC PARTIAL NEPHRECTOMY Left 10/24/2017    hand-assisted    RENAL BIOPSY, PERCUTANEOUS  03/08/2020    clear cell renal cell carcinoma         Past Social Hx:       Social History     Social History Narrative    Not on file         [phone]   History Minimal review of history related to current functional performance Expanded review of physical, cognitive or psychosocial history related to current functional performance Extensive review of physical, cognitive or psychosocial history related to current functional performance   Examination of Body Systems Addressing 1-3 performance deficits Addressing a total of 3-5 performance deficits Addressing a total of 5 or more performance deficits   Clinical Presentation Modification of tasks not necessary to complete the evaluation Min-moderate modification of tasks or assistance necessary to complete the evaluation Significant modification of tasks or assistance necessary to complete the evaluation   Typical Face-to-Face Time (minutes) 30 45 60   Clinical Decision Making (Complexity) Low Moderate High   Co-treatment: OT  Co-treatment completed in order to maximize therapeutic goals of patient due to acute nature of medical complexity. Skilled PT documentation supported above.      Patient Information     PRECAUTIONS: monitor vitals ; HD - MWF        Weight bearing status;      Weight bearing status:  Weight bearing as tolerated        Prior level of function      Comments:  Patient lives with husband and children in 1 SH with 0 ste. Patient was Ind for ADLs and functional mobility. Patient drives.     Subjective:     Numeric  pain scale:  0out of 10     Comments:  Patient supine in bed, agreeable to session. Patient reports she has been up and down all night, back and forth  to bathroom, unplugging telemetry. Pleasant.     Objective:     Cognition:  Person, Place, Time, and Situation     Communication ability:  good       Range of Motion (ROM)     RLE  - WNL?  yes     LLE - WNL?  yes         Strength     RLE strength WNL?  yes     LLE strength WNL?  yes          Sensation:  Intact     Skin integrity/edema:  WFLs     Functional Ability     Rolling:  Independent     Supine - Sit:  Independent     Sit - Supine:  Independent       Sitting balance:  Fair  Stand balance:  Fair       Sit - stand:  Independent     Bed - Chair/ Chair - bed:  Independent     Comments:  Patient completed supine to sitting with IND. Patient stood with IND.     Ambulation      Comments: Patient ambulated 1 bout x > 300 ft with no device and IND.          Assessment     Assessment:  Patient admitted for chest pain, HTN management, NSTEMI. Patient had recent parathyroidectomy on 7/22 at [facility]. Patient has hx of nephrectomy and is HD patient.    Patient is s/p LHC on 7/29 and is s/p angioplasty and DES to LAD.   Patient completed supine to sitting with IND. Patient stood with IND. Patient ambulated 1 bout x > 300 ft with no device and IND.  Patient well, no deficits, no SOB, no pain. No acute PT needs. Recommend she get up several times while here. Recommend discharge to home once medical clearance. Will likely return home and go to outpatient cardiac rehab.     Goals:    No goals set  Patient and/or family Goals/Expectations:  go home: Realistic    Plan:       Patient to be seen: 1 time     Discharge from PT caseload  Recommend up several times a day. Let staff know before going on walks.    PT Recommendations for Nursing:  activity as tolerated, monitor vitals   PT Recommendations for Patient/Family::  mobility as tolerated, notify nursing     Additional information:     Anticipated rehab needs at discharge:  Outpatient rehab phase 2 cardiac rehab     Patient has been advised of PT diagnosis, goals and plan:   Yes     Patient/family has given verbal consent for eval, treatment: and plan of care Yes    Treatment provided: therapeutic activity x 1 unit     [facility] PT, DPT

## 2023-04-25 NOTE — Discharge Summary (Signed)
[facility]    DISCHARGE SUMMARY      PATIENT NAME:  [name], [name]  MRN:  [id number]  DOB:  July 24, 1966    ENCOUNTER START DATE:  04/21/2023  INPATIENT ADMISSION DATE: 04/21/2023  DISCHARGE DATE:  04/25/2023    ATTENDING PHYSICIAN: Linzie Collin, MD  PRIMARY CARE PHYSICIAN: Sallye Lat, NP     ADMISSION DIAGNOSIS: NSTEMI (non-ST elevated myocardial infarction) (CMS [facility])  Chief Complaint   Patient presents with    Chest Pain        DISCHARGE DIAGNOSIS:     Principal Problem:  NSTEMI (non-ST elevated myocardial infarction) (CMS [facility])    Active Hospital Problems    Diagnosis Date Noted    Principal Problem: NSTEMI (non-ST elevated myocardial infarction) (CMS HCC) [I21.4] 04/21/2023    Hypercholesterolemia [E78.00] 04/22/2023    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 difficile diarrhea 12/02/2014    Cellulitis of foot, left 11/29/2014    Diabetic foot infection (CMS HCC)  (CMS HCC) 10/24/2014      Patient Active Problem List   Diagnosis Code    Diabetic foot infection (CMS HCC)  (CMS HCC) E11.628, L08.9    Type 2 diabetes mellitus (CMS HCC) E11.9    Cellulitis of foot, left L03.116    Uncontrolled hypertension I10    Obesity E66.9    C. 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        Details   aspirin 81 mg Tablet, Chewable  Start taking on: April 26, 2023   81 mg, Oral, DAILY  Qty: 30 Tablet  Refills: 0     calcitrioL 0.5 mcg Capsule  Commonly known as: ROCALTROL  Start taking  on: April 26, 2023   1 mcg, Oral, DAILY  Qty: 60 Capsule  Refills: 0     hydrALAZINE 10 mg Tablet  Commonly known as: APRESOLINE   10 mg, Oral, 3 TIMES DAILY  Qty: 90 Tablet  Refills: 0     nitroGLYCERIN 0.4 mg Tablet, Sublingual  Commonly known as: NITROSTAT   0.4 mg, Sublingual, EVERY 5 MIN PRN, for 3 doses over 15 minutes  Qty: 10 Tablet  Refills: 0     ticagrelor 90 mg Tablet  Commonly known as: BRILINTA   90 mg, Oral, 2 TIMES DAILY  Qty: 60 Tablet  Refills: 0     zolpidem 5 mg Tablet  Commonly known as: AMBIEN   5 mg, Oral, NIGHTLY PRN  Qty: 30 Tablet  Refills: 0            CONTINUE these medications which have CHANGED during your visit.        Details   atorvastatin 40 mg Tablet  Commonly known as: LIPITOR  What changed:   medication strength  how much to take   40 mg, Oral, EVERY EVENING  Qty: 30 Tablet  Refills: 0     losartan 25 mg Tablet  Commonly known as: COZAAR  What changed:   when to take this  additional instructions   25 mg, Oral, DAILY  Qty: 30 Tablet  Refills: 0            CONTINUE these medications - NO CHANGES  were made during your visit.        Details   ALPRAZolam 0.25 mg Tablet  Commonly known as: XANAX   0.25 mg, Oral, DAILY PRN  Refills: 0     cyclobenzaprine 5 mg Tablet  Commonly known as: FLEXERIL   5 mg, Oral, 3 TIMES DAILY PRN  Refills: 0     escitalopram oxalate 10 mg Tablet  Commonly known as: LEXAPRO   10 mg, Oral, NIGHTLY  Refills: 0     Fish OiL 1,000 mg (120 mg-180 mg) Capsule  Generic drug: omega-3-DHA-EPA-fish oil   Oral  Refills: 0     Fish Oil-Omega-3 Fatty Acids 360-1,200 mg Capsule   2 Capsules, Oral, 2 TIMES DAILY  Refills: 0     fluticasone propionate 50 mcg/actuation Spray, Suspension  Commonly known as: FLONASE   1 Spray, Each Nostril, DAILY PRN  Refills: 0     iron sucrose 50 mg iron/2.5 mL Solution  Commonly known as: VENOFER   50 mg  Refills: 0     labetaloL 200 mg Tablet  Commonly known as: NORMODYNE   200 mg, Oral, EVERY 8 HOURS (SCHEDULED)  Qty: 90 Tablet  Refills: 0     loperamide 2 mg Capsule  Commonly known as: IMODIUM   4 mg, Oral, EVERY 6 HOURS PRN  Refills: 0     loratadine 10 mg Tablet  Commonly known as: CLARITIN   10 mg, Oral, DAILY PRN  Refills: 0     Mounjaro 7.5 mg/0.5 mL Pen Injector  Generic drug: tirzepatide   7.5 mg, Subcutaneous, EVERY 7 DAYS  Refills: 0     promethazine-dextromethorphan 6.25-15 mg/5 mL Syrup  Commonly known as: PHENERGAN-DM   TAKE 5 ML BY MOUTH 4 TIMES DAILY AS NEEDED FOR COUGH  Refills: 0     sevelamer carbonate 800 mg Tablet  Commonly known as: RENVELA   1,600 mg, Oral  Refills: 0     traMADoL 50 mg Tablet  Commonly known as:  ULTRAM   50 mg, Oral, EVERY 6 HOURS PRN  Qty: 30 Tablet  Refills: 0     Triphrocaps 1 mg Capsule  Generic drug: B complex-vitamin C-folic acid   1 Capsule, Oral, NIGHTLY  Refills: 0     vitamin E 100 unit Capsule   100 Units, Oral, 2 TIMES DAILY  Refills: 0            STOP taking these medications.      diatrizoate meg-diatrizoat sod 66-10 % Solution     vitamin E (dl, acetate) 45 mg (841 unit) Capsule  Commonly known as: AQUASOL E             ASK your doctor about these medications.        Details   albuterol sulfate 90 mcg/actuation oral inhaler  Commonly known as: PROVENTIL or VENTOLIN or PROAIR   2 Puffs, Inhalation, EVERY 4 HOURS PRN  Qty: 2 Inhaler  Refills: 0              DISCHARGE INSTRUCTIONS:      CARDIAC REHAB PHASE II - [facility]    Individualized education will be given based on the patient's diagnosis and needs.           Indication for Therapy HEART FAILURE    Indication for Therapy MYOCARDIAL INFARCTION    Indication for Therapy PTCA/STENT    Heart Failure Details sys    Method to Determine THR for Program Duration TO BE DETERMINED BY THE CARDIAC REHAB STAFF    Exercise Intensity or MET Level TO BE DETERMINED BY CARDIAC REHAB STAFF    Assign Risk Stratification (ACSM Guidelines) INTERMEDIATE RISK      FOLLOW-UP: [facility] - CARDIOLOGY - [facility] [place], [place]     Reason for visit: HOSPITAL DISCHARGE    Post Discharge Destination: Home    Follow-up reason: OTHER NSTEMI   Follow-up in: 1 WEEK      FOLLOW-UP: [facility] - CARDIOLOGY - [facility] [place], [place]     Reason for visit: HOSPITAL DISCHARGE    Post Discharge Destination: Home    Diagnosis NSTEMI (non-ST elevated myocardial infarction) (CMS [facility]) [[id number]]    Follow-up reason: OTHER    Follow-up in: 1 WEEK       Follow-up Information       Brayton Caves, MD Follow up in 1 week(s).    Specialties: INTERVENTIONAL CARDIOLOGY , CARDIOVASCULAR DISEASE  Contact information:  [address]  [place] [address]  [phone]               [facility] [place] .    Specialty: Cardiology  Contact information:  [address], [address]  [place] [address]  [phone]  Additional information:  From [address], take exit [address] toward [place]. Turn left onto [address], then right onto [address] 0.3 miles then turn left onto [address], and then right onto [address]. The [facility]  is just past [facility].             [facility] [place] .    Specialty: Cardiology  Contact information:  [address]  [place] [address]  [phone]  Additional information:  From [address], take [address] toward [place]. Turn left onto [address], then right onto [address]. Drive 0.3 miles then turn left onto  [address], and then right onto [facility]. The [facility] is just past [facility].             [facility], Steward Drone, NP Follow up in 1 week(s).    Specialty: NURSE PRACTITIONER  Contact information:  [address]  [place] [address]  [phone]                             REASON FOR HOSPITALIZATION AND HOSPITAL COURSE:  This is a 57 y.o., female   admitted for non ST elevation myocardial infarction.  Had left heart catheterization with PCI to LAD.  Started on Brilinta and aspirin.  Will go home today.  We will follow up with Cardiology for cardiac rehab.  We will follow with PCP.      Hospital course    Non ST-Elevation Myocardial Infarction (NSTEMI): Associated with abnormal EKG finding with ST changes in the lateral leads.  Status post left heart catheterization 04/23/2023.  Noted for critically diseased proximal LAD with PCI intervention.  Postprocedure care per Cardiology.  Aspirin 81 mg p.o. daily.  Brilinta 90 mg p.o. b.i.d..        CAD: Chronic.  Continue guideline directed medical therapy with dual antiplatelet therapy as above.  Losartan 25 mg p.o. daily, labetalol 20 mg p.o. t.i.d..  Monitor on telemetry.  Serial EKGs as needed.        End Stage Renal Disease on hemodialysis        Hyperparathyroidism with recent parathyroidectomy (04/17/23)        Hypertension:  Benign.  Controlled.  Current blood pressure 120/47.  Continue labetalol 200 mg p.o. t.i.d..  Continue Cozaar 25 mg p.o. daily.  Adjust as needed.        Diabetes Mellitus II, controlled - glucose 168 but last HA1c on 04/09/23 was 5.8;takes Mounjaro  every 7 days at home; FSBS with SSIP; diabetic diet; continue diabetic teaching        Anxiety/Depression:  Stable. Continue Lexapro and alprazolam as at home        Obesity: Class 2.  BMI over 37.  Discussed weight loss benefits and strategies to achieve goal weight.          Prophylaxis:  SCD        Disposition:        SIGNIFICANT PHYSICAL FINDINGS:   BP (!) 144/98   Pulse 93   Temp 36.8 C (98.2 F)   Resp 15   Ht 1.702 m (5' 7.01")   Wt 107 kg (235 lb 7.2 oz)   LMP 10/24/2014 (Approximate)   SpO2 98%   BMI 36.87 kg/m     General: AAOx3, Obese, No acute distress.   Eyes: Pupils equal and round, reactive to light and accomodation.   HENT:Head atraumatic and normocephalic   Neck: No JVD or thyromegaly or lymphadenopathy   Lungs: Clear to auscultation bilaterally.   Cardiovascular: regular rate and rhythm, S1, S2 normal, no murmur,   Abdomen: Obese, Soft, non-tender, Bowel sounds normal, No hepatosplenomegaly   Extremities: extremities normal, atraumatic, no cyanosis or edema   Skin: Skin warm and dry   Neurologic: Grossly normal   Lymphatics: No lymphadenopathy   Psychiatric: Normal affect and behavior.    SIGNIFICANT LAB:   SIGNIFICANT RADIOLOGY:   CONSULTATIONS:     Cardiology      PROCEDURES PERFORMED:     Left heart catheterization  Hemodynamic Reports for interface-related contacts -- Encounter Level:    HEMODYNAMIC REPORT - Document on 04/23/2023 0000          Indications    Acute systolic congestive heart failure (CMS HCC) [I50.21 (ICD-10-CM)]   NSTEMI (non-ST elevated myocardial infarction) (CMS HCC) [I21.4 (ICD-10-CM)]       Physicians    Panel Physicians Referring Physician Case Authorizing Physician   Brayton Caves, MD (Primary)  Army Melia, MD    Procedure Anesthesia Type   CORONARY ANGIOGRAPHY W/LEFT HEART CATH W/WO LVG    CORONARY ARTERY DRUG ELUTING STENT - INITIAL VESSEL                     Interpretation    Left heart catheterization/PCI of the proximal LAD indication:  Non ST  elevation MI and congestive heart failure.    Left heart catheterization:  Left main is normal.  The LAD has 99% proximal segmental disease and luminal irregularities in the mid to distal portion.  The left circumflex has luminal irregularities.  The ramus intermedius is large and normal.  The RCA is dominant with luminal irregularities.  The RPDA and RPL branches have luminal irregularities.  The LVEDP is 30.  The left ventricular ejection fraction is 30% with severe anterior hypokinesis.  There is a aortic valve gradient of 38 mmHg.  PCI of the proximal LAD:   A 3.5 mm x 48 mm stent is deployed from the ostium of the LAD to the midportion of the LAD after pre dilation with a 2.5 mm x 20 mm balloon, reducing the overall disease to a 0% residual.    Impression:  Successful angioplasty and stenting of the ostial to mid LAD.    Moderate to severe aortic valvular stenosis suggested with a peak to peak aortic valve gradient of 38 mmHg.  Severely reduced LV global systolic function with an EF of 30% and severely elevated LVEDP of 30.    Recommendations:  Guideline directed medical therapy for reduced ejection fraction heart failure, dual antiplatelet therapy, high-intensity statin and aggressive risk factor modification.                                                    Radiation    Time Event Details User   6:39 PM Radiation Tracking Panel 1: Brayton Caves, MD  Fluoro-mGy (mGy) = 889.000; Fluoro Time (min) = 10.3; DAP (Gy-cm2) = 63.000 EW       Coronary Findings    Diagnostic  Dominance: Right  Left Anterior Descending   Ost LAD to Mid LAD lesion is 99% stenosed.         Intervention     Ost LAD to Mid LAD lesion   Stent   Stent was successfully placed.   Post-Intervention Lesion Assessment   There is a 0% residual stenosis post intervention.                  Measurements    Cath EF Quantitative: 30 %                Left Ventricle    The left ventricular size is normal. There is severe left ventricular systolic  dysfunction. LV systolic pressure is normal. LV end diastolic pressure is severely elevated. The ejection fraction is calculated to be 30%. There are  no wall motion abnormalities in the left ventricle. The outflow tract is normal.     Wall Motion        The following segments are hypokinetic: mid anterior, basilar anterior and apical anterior.  The following segments are normal: mid inferior, basilar inferior and apical inferior.                 Aortic Valve    There is moderate aortic valve stenosis.                  R/L Pressures AIR REST     Time Systolic (mmHg) Diastolic (mmHg) Mean (mmHg) EDP (mmHg) SpO2 (%)   CA/AO  6:12 PM 93    60    76        LV  6:33 PM 155    -3     32                                Log Events    Time Event Details User   6:02 PM Timeout: Preprocedure Signed by Jasmine December, RT (R) at 04/23/2023  6:02 PM EW   6:08 PM Sheath Insertion Kit Introd 10cm 53fr 21ga Malachy Chamber Slndr .021in 35mm Flx Str Ss Hdrph Sheath Dil Ant  Wl Pnct - Lidocaine 1% was administered locally over the Right radial artery. Access to the right radial artery was established. a sheath was placed into the right radial artery. EW   6:12 PM Coronaries Injected Cath Angio 61fr Radial Tig 4 Curve 100cm Optitorque Lrg Lum Sh 2 Brd Sft Tip Cor Ss Nyl Polyur - The left coronary artery was selectively engaged, injected and visualized. Multiple views of the injected vessel were taken. EW   6:15 PM Coronaries Injected Cath Angio 64fr Radial Tig 4 Curve 100cm Optitorque Lrg Lum Sh 2 Brd Sft Tip Cor Ss Nyl Polyur - The right coronary artery was selectively engaged, injected and visualized. Multiple views of the injected vessel were taken. EW   6:17 PM Catheter Exchanged Cath Gd 8fr 100cm Ebu3.5 Curve Lnchr Lrg Lum Radopq Flxb Dist Seg Inner Jkt Cor Nyl Plmr Strl Lf - Exchanged over wire EW   6:20 PM Interventional Guidewire Inserted Gw Runthrough .36mm 3cm 180cm Radopq X Flpy Vas Strl Lf  Disp Ptca - Inserted under fluoro EW   6:30  PM Interventional Guidewire Removed Gw Runthrough .36mm 3cm 180cm Radopq X Flpy Vas Strl Lf  Disp Ptca - Removed intact. EW   6:30 PM Catheter Exchanged Cath Angio Expo Trilon Pigtail Crve 100cm 44fr Periph Full Len Wire Brd Rbst Shft - Exchanged over wire EW   6:33 PM LV Pressures The catheter was passed across the aortic valve and into the left ventricle. Pressure measurements were recorded within the left ventricle. EW   6:34 PM Angiogram LV angiogram performed. Injection amount = 27ml. Injection rate = 30ml/sec. EW   6:36 PM Catheter Removed Cath Angio Expo Trilon Pigtail Crve 100cm 24fr Periph Full Len Wire Brd Rbst Shft - Catheter removed intact EW   6:39 PM Sheath removal w/ device closure All sheaths and wires removed from the access site. TR band closure device inserted over the right radial artery. Hemostasis achieved.19 cc air in the band EW          Previous Versions    Report on 04/23/23 1907 EDT by Brayton Caves, MD  Signed    Electronically signed by Brayton Caves, MD on 04/23/23 at 1907 EDT   Electronically addended by Brayton Caves, MD on 04/23/23 at 1910 EDT                DOES PATIENT HAVE ADVANCED DIRECTIVES:  Yes, Patient Does Have Advance Directive for Healthcare Treatment    ADVANCED CARE PLANNING - Not applicable for this patient    CONDITION ON DISCHARGE: Alert, Oriented, and VS Stable    DISCHARGE DISPOSITION:  Home discharge     Copies sent to Care Team         Relationship Specialty Notifications Start End    Sallye Lat, NP PCP - General EXTERNAL  08/15/19     Phone: [phone] Fax: [phone]         [address] [place] [address]

## 2023-04-25 NOTE — Nurses Notes (Signed)
Assumed care of pt at 1900. Assessment completed, medications given per MAR. Care of plan reviewed with pt, all questions answered. Pt resting in bed with eyes closed for majority of shift. Pt c/o multiple liquid stools. MD notified, new order placed and followed. No acute events this shift. Bed in lowest position, call bell within reach.

## 2023-04-25 NOTE — Nurses Notes (Signed)
Patient discharged home with family.  AVS reviewed with patient.  A written copy of the AVS and discharge instructions was given to the patient.  Questions sufficiently answered as needed.  Patient/care giver encouraged to follow up with PCP as indicated.  In the event of an emergency, patient/care giver instructed to call 911 or go to the nearest emergency room.  Patient is alert and oriented times four and is on room air. Patient denied pain at this time. Herschell Dimes, RN

## 2023-04-25 NOTE — Discharge Instructions (Signed)
Follow with PCP.  Follow Cardiology.

## 2023-04-25 NOTE — Care Plan (Signed)
Assessment     Assessment:  Patient admitted for chest pain, HTN management, NSTEMI. Patient had recent parathyroidectomy on 7/22 at [facility]. Patient has hx of nephrectomy and is HD patient.    Patient is s/p LHC on 7/29 and is s/p angioplasty and DES to LAD.   Patient completed supine to sitting with IND. Patient stood with IND. Patient ambulated 1 bout x > 300 ft with no device and IND.  Patient well, no deficits, no SOB, no pain. No acute PT needs. Recommend she get up several times while here. Recommend discharge to home once medical clearance. Will likely return home and go to outpatient cardiac rehab.      Goals:    No goals set  Patient and/or family Goals/Expectations:  go home: Realistic     Plan:       Patient to be seen: 1 time     Discharge from PT caseload  Recommend up several times a day. Let staff know before going on walks.    PT Recommendations for Nursing:  activity as tolerated, monitor vitals   PT Recommendations for Patient/Family::  mobility as tolerated, notify nursing      Additional information:     Anticipated rehab needs at discharge:  Outpatient rehab phase 2 cardiac rehab     Patient has been advised of PT diagnosis, goals and plan:  Yes     Patient/family has given verbal consent for eval, treatment: and plan of care Yes     Treatment provided: therapeutic activity x 1 unit      [facility] PT, DPT

## 2023-04-25 NOTE — Care Plan (Signed)
Problem: Adult Inpatient Plan of Care  Goal: Plan of Care Review  Outcome: Ongoing (see interventions/notes)  Goal: Patient-Specific Goal (Individualized)  Outcome: Ongoing (see interventions/notes)  Goal: Absence of Hospital-Acquired Illness or Injury  Outcome: Ongoing (see interventions/notes)  Goal: Optimal Comfort and Wellbeing  Outcome: Ongoing (see interventions/notes)  Goal: Rounds/Family Conference  Outcome: Ongoing (see interventions/notes)     Problem: Pain Acute  Goal: Optimal Pain Control and Function  Outcome: Ongoing (see interventions/notes)     Problem: Chest Pain  Goal: Resolution of Chest Pain Symptoms  Outcome: Ongoing (see interventions/notes)

## 2023-04-25 NOTE — Progress Notes (Signed)
[name]  [place], [place] [address]    INPATIENT PROGRESS NOTE      [name], Bring  Date of Admission:  04/21/2023  Date of Birth:  05/01/1966  Date of Service:  04/25/2023        Assessment/ Plan:         Non ST-Elevation Myocardial Infarction (NSTEMI): Associated with abnormal EKG finding with ST changes in the lateral leads.  Status post left heart catheterization 04/23/2023.  Noted for critically diseased proximal LAD with PCI intervention.  Postprocedure care per Cardiology.  Aspirin 81 mg p.o. daily.  Brilinta 90 mg p.o. b.i.d..      CAD: Chronic.  Continue guideline directed medical therapy with dual antiplatelet therapy as above.  Losartan 25 mg p.o. daily, labetalol 20 mg p.o. t.i.d..  Monitor on telemetry.  Serial EKGs as needed.        End Stage Renal Disease on hemodialysis      Hyperparathyroidism with recent parathyroidectomy (04/17/23)       Hypertension:  Benign.  Controlled.  Current blood pressure 120/47.  Continue labetalol 200 mg p.o. t.i.d..  Continue Cozaar 25 mg p.o. daily.  Adjust as needed.       Diabetes Mellitus II, controlled - glucose 168 but last HA1c on 04/09/23 was 5.8;takes Mounjaro every 7 days at home; FSBS with SSIP; diabetic diet; continue diabetic teaching       Anxiety/Depression:  Stable. 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  Discussed weight loss benefits and strategies to achieve goal weight.        Prophylaxis:  SCD      Disposition:  Yet to be determined.  Pending medical recovery.            Chief Complaint:  Follow-up for chest pain, end-stage renal disease on hemodialysis, hypertension, diabetes mellitus type 2.        Subjective: Pt seen. Admits above.      ROS: Denies fever/chills. No chest pain/SOB. No abdominal pain, nausea, or vomiting.         Today's Physical Exam:  BP (!) 144/98   Pulse 93   Temp 36.8 C (98.2 F)   Resp 15   Ht 1.702 m (5' 7.01")   Wt 107 kg (235 lb 7.2 oz)   LMP  10/24/2014 (Approximate)   SpO2 98%   BMI 36.87 kg/m       General: AAOx3, Obese, No acute distress.   Eyes: Pupils equal and round, reactive to light and accomodation.   HENT:Head atraumatic and normocephalic   Neck: No JVD or thyromegaly or lymphadenopathy   Lungs: Clear to auscultation bilaterally.   Cardiovascular: regular rate and rhythm, S1, S2 normal, no murmur,   Abdomen: Obese, Soft, non-tender, Bowel sounds normal, No hepatosplenomegaly   Extremities: extremities normal, atraumatic, no cyanosis or edema   Skin: Skin warm and dry   Neurologic: Grossly normal   Lymphatics: No lymphadenopathy   Psychiatric: Normal affect and behavior.    Current Medications:  acetaminophen (TYLENOL) tablet, 1,000 mg, Oral, Q6H PRN  ALPRAZolam (XANAX) tablet, 0.25 mg, Oral, Q8H PRN  aspirin chewable tablet 81 mg, 81 mg, Oral, Daily  atorvastatin (LIPITOR) tablet, 40 mg, Oral, QPM  B complex-vitamin C-folic acid (NEPHRO-VITE) tablet, 1 Tablet, Oral, NIGHTLY  calcitriol (ROCALTROL) capsule, 1 mcg, Oral, Daily  calcium carbonate (TUMS) 500mg  (200mg  elemental calcium) chewable tablet, 500 mg, Oral, 3x/day-Meals  cyclobenzaprine (FLEXERIL) tablet, 5 mg, Oral, 3x/day PRN  epoetin alfa-epbx (RETACRIT) 10,000 units/mL injection, 10,000 Units, Intravenous, Give in Dialysis  escitalopram (LEXAPRO) tablet, 10 mg, Oral, NIGHTLY  hydrALAZINE (APRESOLINE) tablet, 10 mg, Oral, Q8HRS  labetalol (NORMODYNE) tablet, 200 mg, Oral, Q8HRS  levOCARNitine (CARNITOR) 200 mg/mL injection, 32 mg/kg (Ideal), Intravenous, Give in Dialysis  loperamide (IMODIUM) capsule, 2 mg, Oral, Q4H PRN  losartan (COZAAR) tablet, 25 mg, Oral, Daily after Dinner  magnesium hydroxide (MILK OF MAGNESIA) 400mg  per 5mL oral liquid, 30 mL, Oral, HS PRN  melatonin tablet, 9 mg, Oral, NIGHTLY  nitroGLYCERIN (NITROSTAT) sublingual tablet, 0.4 mg, Sublingual, Q5 Min PRN  NS 250 mL flush bag, , Intravenous, Q1H PRN  NS flush syringe, 10 mL, Intravenous, Q8H  NS flush syringe,  10 mL, Intravenous, Q8HRS  NS flush syringe, 10 mL, Intravenous, Q1H PRN  ondansetron (ZOFRAN ODT) rapid dissolve tablet, 4 mg, Oral, Q6H PRN  prochlorperazine (COMPAZINE) 5 mg/mL injection, 10 mg, Intravenous, Q6H PRN  promethazine-dextromethorphan (PHENERGAN-DM) 6.25-15 mg per 5 mL oral liquid, 5 mL, Oral, Q6H PRN  ticagrelor (BRILINTA) tablet, 90 mg, Oral, 2x/day  traMADol (ULTRAM) tablet, 50 mg, Oral, Q6H PRN  zolpidem (AMBIEN) tablet, 5 mg, Oral, HS PRN          I/O:  I/O last 24 hours:    Intake/Output Summary (Last 24 hours) at 04/25/2023 1003  Last data filed at 04/25/2023 0900  Gross per 24 hour   Intake 489 ml   Output 2200 ml   Net -1711 ml     I/O current shift:  No intake/output data recorded.      Laboratory Results:    Reviewed:   Results for orders placed or performed during the hospital encounter of 04/21/23 (from the past 24 hour(s))   RENAL FUNCTION PANEL   Result Value Ref Range    SODIUM 137 136 - 145 mmol/L    POTASSIUM 4.4 3.5 - 5.1 mmol/L    CHLORIDE 101 96 - 111 mmol/L    CO2 TOTAL 23 22 - 30 mmol/L    ANION GAP 13 4 - 13 mmol/L    BUN 48 (H) 8 - 25 mg/dL    CREATININE 1.61 (H) 0.60 - 1.05 mg/dL    BUN/CREA RATIO 6 6 - 22    CALCIUM 7.1 (L) 8.6 - 10.2 mg/dL    GLUCOSE 096 (H) 65 - 125 mg/dL    PHOSPHORUS 4.4 2.4 - 4.7 mg/dL    ALBUMIN 2.5 (L) 3.5 - 5.0 g/dL     ESTIMATED GFR - FEMALE 6 (L) >=60 mL/min/BSA   TRANSFERRIN SATURATION PANEL   Result Value Ref Range    TOTAL IRON BINDING CAPACITY 231 (L) 252 - 504 ug/dL    IRON (TRANSFERRIN) SATURATION 48 15 - 50 %    IRON 110 45 - 170 ug/dL    TRANSFERRIN 045 (L) 180 - 360 mg/dL   FERRITIN   Result Value Ref Range    FERRITIN 2,999 (H) 5 - 200 ng/mL   CBC   Result Value Ref Range    WBC 6.1 3.7 - 11.0 x10^3/uL    RBC 2.24 (L) 3.85 - 5.22 x10^6/uL    HGB 7.7 (L) 11.5 - 16.0 g/dL    HCT 40.9 (L) 81.1 - 46.0 %    MCV 102.2 (H) 78.0 - 100.0 fL    MCH 34.4 (H) 26.0 - 32.0 pg    MCHC 33.6 31.0 - 35.5 g/dL    RDW-CV 91.4 (H)  11.5 - 15.5 %    PLATELETS 159  150 - 400 x10^3/uL    MPV 9.7 8.7 - 12.5 fL   RETICULOCYTE COUNT   Result Value Ref Range    RETICULOCYTE % AUTOMATED 1.70 0.50 - 2.20 %    RETICULOCYTES COUNT # AUTOMATED 38.1 17.0 - 100.0 x10^3/uL    IMMATURE RETIC FRACTION 10.0 2.7 - 15.9 %    RETICULOCYTE HEMOGLOBIN EQUIVALENT 33.2 28.0 - 38.0 pg   CBC   Result Value Ref Range    WBC 6.2 3.7 - 11.0 x10^3/uL    RBC 2.61 (L) 3.85 - 5.22 x10^6/uL    HGB 8.9 (L) 11.5 - 16.0 g/dL    HCT 16.1 (L) 09.6 - 46.0 %    MCV 100.8 (H) 78.0 - 100.0 fL    MCH 34.1 (H) 26.0 - 32.0 pg    MCHC 33.8 31.0 - 35.5 g/dL    RDW-CV 04.5 (H) 40.9 - 15.5 %    PLATELETS 211 150 - 400 x10^3/uL    MPV 10.4 8.7 - 12.5 fL   RENAL FUNCTION PANEL   Result Value Ref Range    SODIUM 135 (L) 136 - 145 mmol/L    POTASSIUM 4.1 3.5 - 5.1 mmol/L    CHLORIDE 102 96 - 111 mmol/L    CO2 TOTAL 25 22 - 30 mmol/L    ANION GAP 8 4 - 13 mmol/L    BUN 18 8 - 25 mg/dL    CREATININE 8.11 (H) 0.60 - 1.05 mg/dL    BUN/CREA RATIO 4 (L) 6 - 22    CALCIUM 8.4 (L) 8.6 - 10.2 mg/dL    GLUCOSE 94 65 - 914 mg/dL    PHOSPHORUS 3.7 2.4 - 4.7 mg/dL    ALBUMIN 2.8 (L) 3.5 - 5.0 g/dL     ESTIMATED GFR - FEMALE 11 (L) >=60 mL/min/BSA     Ordered:    Lab orders   Procedures    FOLLOW-UP: [facility]OLOGY - [facility] - [place], [place]         Radiological Studies:  Reviewed:    Ordered:  CT ANGIO CHEST FOR PULMONARY EMBOLUS W IV CONTRAST  INVASIVE CARDIOLOGY PROCEDURE    Problem List:  Active Hospital Problems   (*Primary Problem)    Diagnosis    *NSTEMI (non-ST elevated myocardial infarction) (CMS HCC)    Hypercholesterolemia    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)    Obesity     Chronic     Portion control, has already changed diet      Uncontrolled hypertension     Not at goal. Losartan increased already. Will add Amlodipine 5. Goal <120/80      Type 2 diabetes mellitus (CMS HCC)     Chronic     Control much improved - A1C 5.2% in January. Continue current regimen           Linzie Collin, MD, 04/25/2023,  10:03

## 2023-04-25 NOTE — Nurses Notes (Signed)
"HEMODIALYSIS SUMMARY"  PATIENT: [name] MRN: [id number] ROOM: 211/A  Order  HEMODIALYSIS [DIACHI1] (Order [id number])  Kadija, Cruzen  Order #: [id number] Accession #: MRN: [id number]  General Information    No case/log ID found     Order Providers    Authorizing   Mendel Corning, MD            Order Information    Date Department Ordering/Authorizing   04/25/2023 [facility] 35F Mendel Corning, MD       Start Date/Time    Start Time   04/25/23 0515           Acknowledgement Info    For At Acknowledged By Acknowledged On   Placing Order 04/25/23 0516 Larrie Kass, RN 04/25/23 0523         Released/Completed Orders    Released On Released By Completed On Completed By   04/25/23 0516 Mendel Corning, MD (auto-released)                       Standing Order Information    Remaining Occurrences Interval Last Released     0/1 ONE TIME 04/25/2023                  Released Orders      Released On Order # Scheduled For Released By   1. 04/25/2023  5:16 AM [id number] 04/25/2023  5:15 AM Mendel Corning, MD (auto-released)                  Comments    Heparin 800 units/hour.  Keep SBP > 100 mmHg at all times during treatment.            Order Questions    Question Answer   Reason for Treatment: End stage renal disease(CKD-Stage6)   Length of Dialysis (hrs): 4   If unable to maintain flow due to inadequate vascular access patency, patient intoleralance, or elevated venous pressure, adjust blood flow rate between: 250   Target Blood Flow Rate (ml/min): 400   Size of fistula needle: 15 gauge - 1 inch   Dialyzer: Nipro 17H   Fluid Removal (kg): 2.0   Ultrafiltration Goal (L): 2   if unable to maintain this UFR due to patient intolerance (i.e. hypotension, chest pain, muscle cramping, nausea or vomiting), adjust UFR to achieve this range in Liters : 0-2   Dialysate flow rate (ml/min): 700   Acid Bath: 3 K / 2.5 Ca   Bicarb Settings: Other   Other: 32   Sodium Type: Linear   Sodium: 137   Calcium: 3.5   Type of access:  Fistula   Dialysate temperature: 36 degrees Celsius                    Additional Information    Associated Reports   View Encounter   Priority and Order Details         HEMODIALYSIS    Electronically signed by: Mendel Corning, MD on 04/25/23 [phone]   Ordering user: Mendel Corning, MD 04/25/23 [phone] Ordering provider: Mendel Corning, MD   Authorized by: Mendel Corning, MD Ordering mode: Standard     Authorizing Provider NPI information    Mendel Corning, MD                [phone]  Patient Release Status:    This result is automatically blocked from the patient.               [facility] Result Comments     Not Released  Not seen          Order Transmittal Tracking/Results Routing Info    Order Transmittal Tracking/Results Routing Info       Order Set Info    Order Set Source   Did not come from Order set      Reprint Order    HEMODIALYSIS (Order [id number]) on 04/25/23        LABS:   Latest Reference Range & Units 04/25/23 06:25   WBC 3.7 - 11.0 x10^3/uL 6.2   HGB 11.5 - 16.0 g/dL 8.9 (L)   HCT 40.9 - 81.1 % 26.3 (L)   PLATELET COUNT 150 - 400 x10^3/uL 211   RBC 3.85 - 5.22 x10^6/uL 2.61 (L)   MCV 78.0 - 100.0 fL 100.8 (H)   MCHC 31.0 - 35.5 g/dL 91.4   MCH 78.2 - 95.6 pg 34.1 (H)   RDW-CV 11.5 - 15.5 % 15.6 (H)   MPV 8.7 - 12.5 fL 10.4   SODIUM 136 - 145 mmol/L 135 (L)   POTASSIUM 3.5 - 5.1 mmol/L 4.1   CHLORIDE 96 - 111 mmol/L 102   CARBON DIOXIDE 22 - 30 mmol/L 25   BUN 8 - 25 mg/dL 18   CREATININE 2.13 - 1.05 mg/dL 0.86 (H)   GLUCOSE 65 - 125 mg/dL 94   ANION GAP 4 - 13 mmol/L 8   BUN/CREAT RATIO 6 - 22  4 (L)   ESTIMATED GFR - FEMALE >=60 mL/min/BSA 11 (L)   CALCIUM 8.6 - 10.2 mg/dL 8.4 (L)   PHOSPHORUS 2.4 - 4.7 mg/dL 3.7   ALBUMIN 3.5 - 5.0 g/dL  2.8 (L)   (L): Data is abnormally low  (H): Data is abnormally high  CONSENT SIGNED:  04/23/2023 - Yes  BLOOD CONSENT:      N/A:  yes   DX:   Patient Active Problem List   Diagnosis    Diabetic foot infection (CMS HCC)  (CMS HCC)    Type 2 diabetes  mellitus (CMS HCC)    Cellulitis of foot, left    Uncontrolled hypertension    Obesity    C. 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)    NSTEMI (non-ST elevated myocardial infarction) (CMS HCC)    Hypercholesterolemia     DIABETIC:  Yes  CODE STATUS:  Full Code  ALLERGIES:   Allergies   Allergen Reactions    Gabapentin Itching    Hydrocodone     Ibuprofen     Vicodin [Hydrocodone-Acetaminophen] Itching     DIET: DIET CARDIAC  ACCESS: LUA AVF     NEEDLE SIZE: 15g    WT:   106 kg    EDW:  107.5 kg   UFG:0-2L  PRE TX NURSE FROM/TIME: Herschell Dimes RN at [phone]  TIME OUT TIME/SAFETY CHECKS:  1052  HEPATITIS STATUS:  HBSAG  DATE:   04/16/2023    RESULTS: Negative  HBSAB   DATE:   04/16/2023    RESULTS: Immune = 102  MOBILITY: BED yes   STRETCHER  1ST TIME ACUTE:      STAT:     ROUTINE:  yes     URGENT:  ACUTE ROOM:    yes   BEDSIDE:     ICU:  ISOLATION PRECAUTION:  DIALYSIS:  yes     AIR[place]ORNE:     CONTACT:      REVERSE:      DROPLET:  SPECIAL CONSIDERATIONS: None  TOTAL CHLORINE < 0.1PPM  TIME:   0745      2ND CHECK TIME: 1145  TREATMENT INITIATION:  1052  BATH K/CA:  3/2.5        DFR: 700  ISOLATED UF ONLY: No  NA: 137   HCO3: 32  INCAPACITATED NURSE EDUCATION COMPLETED:       MULTI-SUITE:   yes   NA:  MD NOTIFIED AT THE START:  Dr. Webb Silversmith     04/25/23 1038   Machine Checks   Safety Checks P   Machine Number 6   Machine Temp 36 C (96.8 F)   Alarms Set Yes (Pre-Set)   Conductivity 13.9   Bleach -   Independent pH 7.2   Dialysate Used: premixed hemodialysate (NATURALYTE) 3.43 L;with potassium chloride 3 mEq/L;with calcium chloride 3.5 mEq/L   Dialyzer Used: Nipro 17H   Med Being Verified Dialysate,Heparin   Med  Verification (1st) Clinician Verification of Medication/Blood   Additional Pre- Dialysis Documentation   Hepatitis B Status Checked: Negative Surface Antigen;Immunity   Hepatitis B Date Drawn 04/16/23   Pre-Dialysis Safety Checks Yes   Total Chlorine Less than 0.1   Dialysis Prescription   Order Confirmed Yes   Pre Dialysis Vitals   Dry Weight 107 kg (236 lb 15.9 oz)   Weight (Pre Dialysis) 106 kg (233 lb 11 oz)   Weight Source Bed   Temp 36.1 C (97 F)   BP (Non-Invasive) (!) 164/85   BP Source (Non-Invasive) RA   Heart Rate 91   SpO2 96 %   Pre Dialysis Assessment   Patient Status Arrived;Report Received;Stable   Cardiac Pulse Regular   Respiratory Lungs Clear   Mental Status Alert   GI Complaints No Complaint   Fluid Assessment Other (Comment)   Lower Extremity Edema Status   (Trace)   Pain Score 0   Patient Report Received From: Herschell Dimes RN at [phone]   Vascular Access   Was Catheter Placed in Dialysis? No   Access Site Left Arm;Upper   Access Type AV Fistula   Access Assessment WDL   Needle Size 15 gauge   Access Patent Yes   Dialysis Consent   Consent Obtained Written   Date Consent Obtained 04/23/23   Procedure Time Out   Consent Obtained Verbal   Patient Identification (name and date of birth): Patient Verbally Identified Self   Patient Position Semi-fowler   Procedure Verified? Yes   Time Out Performed By [place] RN at        04/25/23 1052   Hemodialysis Monitor   Patient Status Stable   Time On 1052   Vitals and Machine Parameters   BP 156/78   Pulse 81   Blood Flowrate 400 ML/MIN   Arterial Pressure -140 mmHg   Venous Pressure 180 mmHg   Transmembrane Pressure (TMP) 70 mmHg   Ultrafiltration Rate (Dialysis) 620   Fluids  Normal Saline   IV Fluid Administered Due To:   (Prime)   Fluid Volume 250 mL   Fluid Goal 2 Kg  (0-2L as tolerated)   Actual Fluid Goal 2.5 Kg   Patient Observed Yes  (Cannulated w/o issue.POC was explained and verbalizes understanding.No complain at this time.)   Additional  Hemodialysis Documentation   Dialysis Access Visible Yes   Hemodialysis Line Secure Yes   Machine Serial Number [id number]   RO Serial Number Main   Alarm Pass Time 0905   Dialyzer Lot# [id number]   Tubing Lot# [id number]   RO/Machine Log Complete Yes   All Connections Secured Yes   Venous/Arterial parameters set Yes   Prime Given 250   Air Foam detector engages  Yes   Saline Line double clamped  Yes   Incapacitated Nurse Education Completed  Yes        04/25/23 1100 04/25/23 1115 04/25/23 1130   Hemodialysis Monitor   Patient Status Stable Stable Stable   Vitals and Machine Parameters   BP 155/78 160/85 163/85   Pulse 81 83 83   Blood Flowrate 400 ML/MIN  --  400 ML/MIN   Arterial Pressure -140 mmHg  --  -150 mmHg   Venous Pressure 180 mmHg  --  190 mmHg   Transmembrane Pressure (TMP) 70 mmHg  --  60 mmHg   Ultrafiltration Rate (Dialysis) 620  --  620   Actual Fluid Goal 2.5 Kg 2.5 Kg 2.5 Kg   Actual Fluid Removal 69 mL 240 mL 400 mL   Patient Observed Yes  (Patient doing her puzzle.) Yes Yes  (Assisted to patient to use bedpan.)   Additional Hemodialysis Documentation   Dialysis Access Visible Yes Yes Yes   Hemodialysis Line Secure Yes Yes Yes      04/25/23 1145 04/25/23 1200 04/25/23 1215   Hemodialysis Monitor   Patient Status Stable Stable Stable   Vitals and Machine Parameters   BP 175/80 174/88 (!) 173/92   Pulse 85 85 88   Blood Flowrate  --  400 ML/MIN  --    Arterial Pressure  --  -150 mmHg  --    Venous Pressure  --  190 mmHg  --    Transmembrane Pressure (TMP)  --  60 mmHg  --    Ultrafiltration Rate (Dialysis)  --  620  --    Actual Fluid Goal 2.5 Kg 2.5 Kg 2.5 Kg   Actual Fluid Removal 555 mL 710 mL 865 mL   Patient Observed Yes Yes  (Patient given some snacks as requested.) Yes   Additional Hemodialysis Documentation   Dialysis Access Visible Yes Yes Yes   Hemodialysis Line Secure Yes Yes Yes      04/25/23 1230 04/25/23 1245 04/25/23 1300   Hemodialysis Monitor   Patient Status Stable Stable  Stable   Vitals and Machine Parameters   BP 174/90 172/81 (!) 173/96   Pulse 87 88 93   Blood Flowrate 400 ML/MIN  --  400 ML/MIN   Arterial Pressure -160 mmHg  --  -170 mmHg   Venous Pressure 220 mmHg  --  200 mmHg   Transmembrane Pressure (TMP) 60 mmHg  --  60 mmHg   Ultrafiltration Rate (Dialysis) 620  --  630   Actual Fluid Goal 2.5 Kg 2.5 Kg 2.5 Kg   Actual Fluid Removal 920 mL 1020 mL 1175 mL   Patient Observed Yes  (On her phone) Yes Yes  (On her phone)  Additional Hemodialysis Documentation   Dialysis Access Visible Yes Yes Yes   Hemodialysis Line Secure Yes Yes Yes      04/25/23 1315 04/25/23 1330 04/25/23 1345   Hemodialysis Monitor   Patient Status Stable Stable Stable   Vitals and Machine Parameters   BP (!) 192/96 176/89 180/82   Pulse 89 92 90   Blood Flowrate  --  400 ML/MIN  --    Arterial Pressure  --  -160 mmHg  --    Venous Pressure  --  200 mmHg  --    Transmembrane Pressure (TMP)  --  60 mmHg  --    Ultrafiltration Rate (Dialysis)  --  690  --    Actual Fluid Goal 2.5 Kg 2.5 Kg 2.5 Kg   Actual Fluid Removal 1380 mL 1555 mL 1730 mL   Patient Observed Yes Yes  (On her phone,looking on duration of treatment) Yes   Additional Hemodialysis Documentation   Dialysis Access Visible Yes Yes Yes   Hemodialysis Line Secure Yes Yes Yes      04/25/23 1400 04/25/23 1415 04/25/23 1430   Hemodialysis Monitor   Patient Status Stable Stable Stable   Vitals and Machine Parameters   BP 162/89 162/89 135/58   Pulse 93 93 80   Blood Flowrate 400 ML/MIN  --  400 ML/MIN   Arterial Pressure -160 mmHg  --  -160 mmHg   Venous Pressure 210 mmHg  --  210 mmHg   Transmembrane Pressure (TMP) 60 mmHg  --  60 mmHg   Ultrafiltration Rate (Dialysis) 700  --  700   Actual Fluid Goal 2.5 Kg 2.5 Kg 2.5 Kg   Actual Fluid Removal 1903 mL 2049 mL 2233 mL   Patient Observed Yes  (Resting quietly,making herself busy.) Yes Yes  (Patient on her phone)   Additional Hemodialysis Documentation   Dialysis Access Visible Yes Yes Yes    Hemodialysis Line Secure Yes Yes Yes      04/25/23 1445 04/25/23 1453   Hemodialysis Monitor   Patient Status Stable Stable   Vitals and Machine Parameters   BP 146/77  --    Pulse 81  --    Blood Flowrate  --  400 ML/MIN   Arterial Pressure  --  -160 mmHg   Venous Pressure  --  210 mmHg   Transmembrane Pressure (TMP)  --  60 mmHg   Ultrafiltration Rate (Dialysis)  --  700   Fluids  --  Normal Saline   IV Fluid Administered Due To:  --  Standard Rinseback   Fluid Volume  --  250 mL   Fluid Goal  --  2 Kg   Actual Fluid Goal 2.5 Kg 2.5 Kg   Actual Fluid Removal 2402 mL 2500 mL   Treatment Total Fluid Removal  --  2000 mL   Patient Observed Yes Yes  (Treatment ended)   Additional Hemodialysis Documentation   [place] On  --  Yes   Dialysis Access Visible Yes Yes   Hemodialysis Line Secure Yes  --       04/25/23 1512   Hemodialysis Monitor   Patient Status Stable   Post Dialysis Vitals   Weight (Post Dialysis) 104 kg (229 lb 4.5 oz)   Weight 106 kg (233 lb 11 oz)   Weight Source Bed   Temp 36.1 C (97 F)   Temp Source Skin   BP (Non-Invasive) (!) 138/50   BP Source (Non-Invasive) C;LL   Heart Rate 89  RR 16   End Treatment Kt/V 1.9   Post Dialysis Assessment   Time Off 1453   $ Treatment Completed Yes   Cardiac Pulse Regular   Respiratory Lungs Clear   Post Vascular Assessment bruit audible, strong;thrill palpable, strong   Dialyzer Appearance Streaked <50%   Dialysis Outcome Goal Met   Post Hemodialysis Patient Status Report Given;Stable       MEDICATIONS/BLOOD PRODUCTS:  E-Prescribing Details    Hospital Medication Detail     Dose Frequency Start End   heparin 1,000 units/mL injection -For DIALYSIS 3,200 Units GIVE IN DIALYSIS 04/25/2023 04/25/2023   Admin Instructions: Heparin 800 units x 4 hrs during HD treatment.  CAUTION:  "High Alert" Medication.   Class: E-Rx   Route: Intravenous   Ordered from Order Set: [facility]: HEMODIALYSIS   E-Prescribing Status: Transmission to pharmacy in progress (04/25/2023 11:05 AM EDT)      Most Recent Administration      User Action Time Recorded Time Dose Route Site Comment Action Reason   [place], [place], [place] 04/25/23 1107 04/25/23 1108 3,200 Units Intravenous   Given    Full Administration Report               E-Prescribing Details    Hospital Medication Detail     Dose Frequency Start End   epoetin alfa-epbx (RETACRIT) 10,000 units/mL injection 10,000 Units GIVE IN DIALYSIS 04/25/2023 04/25/2023   Admin Instructions: Please print Medication Guide for patient at initiation of therapy and every 30 days.  Please document any education provided in Patient Education.  Thank you!   Class: E-Rx   Do Not Fill: Yes (Profile only)   Route: Intravenous   E-Prescribing Status: Transmission to pharmacy in progress (04/25/2023  5:16 AM EDT)     Most Recent Administration    User Action Time Recorded Time Dose Route Site Comment Action Reason   [place], [place], RN 04/25/23 1106 04/25/23 1106 10,000 Units Intravenous   Given    Full Administration Report           E-Prescribing Details    Hospital Medication Detail     Dose Frequency Start End   levOCARNitine (CARNITOR) 200 mg/mL injection 32 mg/kg  61.6 kg (Ideal) GIVE IN DIALYSIS 04/25/2023 04/25/2023   Admin Instructions: Administer into the venous return line, over 3 minutes,  AFTER each dialysis session.   Class: E-Rx   Do Not Fill: Yes (Profile only)   Route: Intravenous   E-Prescribing Status: Transmission to pharmacy in progress (04/25/2023  5:16 AM EDT)     Most Recent Administration      User Action Time Recorded Time Dose Route Site Comment Action Reason   [place], [place], [place] 04/25/23 1107 04/25/23 1107 1,980 mg Intravenous   Given    Full Administration Report             EDUCATION: Patient:  yes   Others:  Knowledge Base:  Substantial: yes   Minimal:   None:  Barriers to Learning:         None:  yes   Teaching Method:   Oral:  yes  Written:  Visual:    Hands On/ Demo:  Topic:  Access Care:  yes  &S of Infection:   yes Fluid Management:   K+:    Procedural:  yes  Albumin:   Medications: yes  Tx Options:   Transplant:    Diet:   Others:  Patient Response:  Verbalized Understanding:  yes  Demonstrate Understanding:  Teach Back:    Return Demo:   No Evidence for Learning:  Requires Follow Up:   None/N/A:  yes     POST TX NOTES:  Patient tolerated and completed treatment well.    POST TX ACCESS:  AVF/AVG: BLEEDING STOP ART__7__MIN  VEN__7__MIN   +BRUIT/THRILL  yes   ACCESS FLOW: Good: yes   Poor:   Positional:   Reversed:   Clotted:  BVP_88.4___L  KT/V__1.9__  NET UF_2000___ML  PRIMARY NURSE REPORT POST TX:  Herschell Dimes   _RN   TIME: 1521

## 2023-04-25 NOTE — Care Plan (Signed)
Problem: Adult Inpatient Plan of Care  Goal: Plan of Care Review  Outcome: Ongoing (see interventions/notes)  Goal: Patient-Specific Goal (Individualized)  Outcome: Ongoing (see interventions/notes)  Flowsheets (Taken 04/24/2023 2215)  Patient would like to participate in bedside shift report: No  Individualized Care Needs: denies  Anxieties, Fears or Concerns: denies  Patient-Specific Goals (Include Timeframe): none verblized  Plan of Care Reviewed With: patient  Goal: Absence of Hospital-Acquired Illness or Injury  Outcome: Ongoing (see interventions/notes)  Intervention: Identify and Manage Fall Risk  Recent Flowsheet Documentation  Taken 04/24/2023 2215 by Larrie Kass, RN  Safety Promotion/Fall Prevention:   nonskid shoes/slippers when out of bed   safety round/check completed  Intervention: Prevent Skin Injury  Recent Flowsheet Documentation  Taken 04/24/2023 2215 by Larrie Kass, RN  Body Position: supine, head elevated  Skin Protection:   adhesive use limited   tubing/devices free from skin contact  Goal: Optimal Comfort and Wellbeing  Outcome: Ongoing (see interventions/notes)  Intervention: Provide Person-Centered Care  Recent Flowsheet Documentation  Taken 04/24/2023 2215 by Larrie Kass, RN  Trust Relationship/Rapport:   care explained   choices provided   questions answered   questions encouraged   thoughts/feelings acknowledged  Goal: Rounds/Family Conference  Outcome: Ongoing (see interventions/notes)

## 2023-04-25 NOTE — Progress Notes (Signed)
END STAGE RENAL DISEASE - HD again today  Anemia - improved. More Retacrit on HD today  Secondary hyperparathyroidism with variable calcium. Awaiting labs from this morning. Continue Tumes with meals and oral Calcitriol for now.   OK to discharge when you feel she is ready.

## 2023-04-25 NOTE — Progress Notes (Signed)
[facility]   [facility]  Progress Note  Date/Time: 04/25/2023 08:22  Patient Name: [name]  MRN#: [id number]  DOB: 1966/03/02  Admission Date: 04/21/2023   Consulting Physician: Linzie Collin, MD  Consulting Cardiologist: Dr. Venita Lick    Subjective:   Denies chest pain, SOB, palpitations.       BP 130-140's/ 50-80's HR 90s  K 4.1 Cr 4.36    Physical Exam:     Vitals:    04/24/23 2341 04/25/23 0326 04/25/23 0631 04/25/23 0725   BP: 133/88 (!) 131/50 131/60 (!) 144/98   Pulse: 99 96 98 93   Resp: 19 15  15    Temp: 36.9 C (98.5 F) 36.9 C (98.4 F)  36.8 C (98.2 F)   SpO2: 99% 100%  98%   Weight:  107 kg (235 lb 7.2 oz)     Height:       BMI:             Body mass index is 36.87 kg/m.    Intake and Output Summary (Last 24 hours) at Date Time    Intake/Output Summary (Last 24 hours) at 04/25/2023 3875  Last data filed at 04/24/2023 2341  Gross per 24 hour   Intake 489 ml   Output 2300 ml   Net -1811 ml       Physical Exam  Vitals and nursing note reviewed.   Constitutional:       Appearance: Normal appearance. She is obese.   Cardiovascular:      Rate and Rhythm: Normal rate and regular rhythm.      Pulses:           Radial pulses are 2+ on the right side and 2+ on the left side.        Dorsalis pedis pulses are 1+ on the right side and 1+ on the left side.      Heart sounds: S1 normal and S2 normal. Murmur heard.      Systolic murmur is present.      Comments: R wrist cath site, no hematoma or drainage.   Pulmonary:      Effort: Pulmonary effort is normal.      Breath sounds: Normal breath sounds. No rales.   Abdominal:      Palpations: Abdomen is soft.   Musculoskeletal:      Right lower leg: No edema.      Left lower leg: No edema.   Skin:     General: Skin is warm and dry.      Capillary Refill: Capillary refill takes less than 2 seconds.   Neurological:      Mental Status: She is alert and oriented to person, place, and time. Mental status is at baseline.   Psychiatric:          Mood and Affect: Mood normal.         Behavior: Behavior normal.         Thought Content: Thought content normal.         Judgment: Judgment normal.              Medications:     Current Facility-Administered Medications   Medication Dose Route Frequency    acetaminophen (TYLENOL) tablet  1,000 mg Oral Q6H PRN    ALPRAZolam (XANAX) tablet  0.25 mg Oral Q8H PRN    aspirin chewable tablet 81 mg  81 mg Oral Daily    atorvastatin (LIPITOR) tablet  40 mg  Oral QPM    B complex-vitamin C-folic acid (NEPHRO-VITE) tablet  1 Tablet Oral NIGHTLY    calcitriol (ROCALTROL) capsule  1 mcg Oral Daily    calcium carbonate (TUMS) 500mg  (200mg  elemental calcium) chewable tablet  500 mg Oral 3x/day-Meals    cyclobenzaprine (FLEXERIL) tablet  5 mg Oral 3x/day PRN    epoetin alfa-epbx (RETACRIT) 10,000 units/mL injection  10,000 Units Intravenous Give in Dialysis    escitalopram (LEXAPRO) tablet  10 mg Oral NIGHTLY    hydrALAZINE (APRESOLINE) tablet  10 mg Oral Q8HRS    labetalol (NORMODYNE) tablet  200 mg Oral Q8HRS    levOCARNitine (CARNITOR) 200 mg/mL injection  32 mg/kg (Ideal) Intravenous Give in Dialysis    loperamide (IMODIUM) capsule  2 mg Oral Q4H PRN    losartan (COZAAR) tablet  25 mg Oral Daily after Dinner    magnesium hydroxide (MILK OF MAGNESIA) 400mg  per 5mL oral liquid  30 mL Oral HS PRN    melatonin tablet  9 mg Oral NIGHTLY    nitroGLYCERIN (NITROSTAT) sublingual tablet  0.4 mg Sublingual Q5 Min PRN    NS 250 mL flush bag   Intravenous Q1H PRN    NS flush syringe  10 mL Intravenous Q8H    NS flush syringe  10 mL Intravenous Q8HRS    NS flush syringe  10 mL Intravenous Q1H PRN    ondansetron (ZOFRAN ODT) rapid dissolve tablet  4 mg Oral Q6H PRN    prochlorperazine (COMPAZINE) 5 mg/mL injection  10 mg Intravenous Q6H PRN    promethazine-dextromethorphan (PHENERGAN-DM) 6.25-15 mg per 5 mL oral liquid  5 mL Oral Q6H PRN    ticagrelor (BRILINTA) tablet  90 mg Oral 2x/day    traMADol (ULTRAM) tablet  50 mg Oral Q6H PRN     zolpidem (AMBIEN) tablet  5 mg Oral HS PRN     Prior to Admission medications    Medication Sig Start Date End Date Taking? Authorizing Provider   albuterol sulfate (PROVENTIL OR VENTOLIN OR PROAIR) 90 mcg/actuation Inhalation HFA Aerosol Inhaler Take 2 Puffs by inhalation Every 4 hours as needed  Patient not taking: Reported on 04/09/2023 08/22/19   Daryl Eastern, MD   ALPRAZolam Prudy Feeler) 0.25 mg Oral Tablet Take 1 Tablet (0.25 mg total) by mouth Once per day as needed for Anxiety 06/03/20   Provider, Historical   atorvastatin (LIPITOR) 20 mg Oral Tablet Take 1 Tablet (20 mg total) by mouth Every evening    Provider, Historical   cyclobenzaprine (FLEXERIL) 5 mg Oral Tablet Take 1 Tablet (5 mg total) by mouth Three times a day as needed for Muscle spasms    Provider, Historical   diatrizoate meg-diatrizoat sod 66-10 % Oral Solution Mix entire bottle of Gastrografin/Gastroview with 32 oz of your choice Non Carbonated beverage (Water, Juice, Gatorade, etc). Complete entire mixture 1 hour prior to exam arrival.  Patient not taking: Reported on 04/09/2023 11/02/22   Sheliah Plane, FNP-BC   escitalopram oxalate (LEXAPRO) 10 mg Oral Tablet Take 1 Tablet (10 mg total) by mouth Every night    Provider, Historical   Fish Oil-Omega-3 Fatty Acids 360-1,200 mg Oral Capsule Take 2 Capsules by mouth Twice daily    Provider, Historical   fluticasone (FLONASE) 50 mcg/actuation Nasal Spray, Suspension Administer 1 Spray into each nostril Once per day as needed (Allergy symptoms (nasal congestion/ runny nose))    Provider, Historical   iron sucrose (VENOFER) 50 mg iron/2.5 mL Intravenous Solution 2.5 mL (50 mg total)  08/22/21   Provider, Historical   labetaloL (NORMODYNE) 200 mg Oral Tablet Take 1 Tablet (200 mg total) by mouth Every 8 hours for 30 days Indications: Hold for SBP<120 or HR<55 05/26/21 10/13/21  Eloisa Northern, MD   loperamide (IMODIUM) 2 mg Oral Capsule Take 2 Capsules (4 mg total) by mouth Every 6 hours as needed (Diarrhea)     Provider, Historical   loratadine (CLARITIN) 10 mg Oral Tablet Take 1 Tablet (10 mg total) by mouth Once per day as needed (Allergies)    Provider, Historical   losartan (COZAAR) 25 mg Oral Tablet Take 1 Tablet (25 mg total) by mouth Once a day for 30 days  Patient taking differently: Take 1 Tablet (25 mg total) by mouth Take in the evening on non-dialysis days (Sun/Tues/Thurs/Sat); does not take on dialysis days 03/30/20 04/09/23  Wandra Feinstein, MD   [facility] 7.5 mg/0.5 mL Subcutaneous Pen Injector Inject 0.5 mL (7.5 mg total) under the skin Every 7 days 01/23/23   Provider, Historical   omega-3-DHA-EPA-fish oil (FISH OIL) 1,000 mg (120 mg-180 mg) Oral Capsule Take by mouth 09/30/21   Provider, Historical   promethazine-dextromethorphan (PHENERGAN-DM) 6.25-15 mg/5 mL Oral Syrup TAKE 5 ML BY MOUTH 4 TIMES DAILY AS NEEDED FOR COUGH    Provider, Historical   sevelamer carbonate (RENVELA) 800 mg Oral Tablet Take 2 Tablets (1,600 mg total) by mouth 03/08/21   Provider, Historical   traMADoL (ULTRAM) 50 mg Oral Tablet Take 1 Tablet (50 mg total) by mouth Every 6 hours as needed for Pain 10/28/20   Myrlene Broker, MD   TRIPHROCAPS 1 mg Oral Capsule Take 1 Capsule by mouth Every night    Provider, Historical   vitamin E 100 unit Oral Capsule Take 1 Capsule (100 Units total) by mouth Twice daily    Provider, Historical   vitamin E, dl, acetate, (AQUASOL E) 45 mg (100 unit) Oral Capsule Take by mouth  Patient not taking: Reported on 04/09/2023 09/30/21   Provider, Historical   calcitrioL (ROCALTROL) 0.25 mcg Oral Capsule Take 1 Capsule (0.25 mcg total) by mouth 08/03/20 04/21/23  Provider, Historical   cinacalcet (SENSIPAR) 60 mg Oral Tablet TAKE 1 TABLET BY MOUTH 3 TIMES A WEEK AS DIRECTED (MON, WED, FRI) AFTER DIALYSIS WITH AFTERNOON OR EVENING MEAL 01/25/23 04/21/23  Provider, Historical   JANUVIA 50 mg Oral Tablet Take 1 Tablet (50 mg total) by mouth Every morning  Patient not taking: Reported on 04/09/2023 04/19/20 04/21/23  Provider,  Historical       Labs Reviewed:     Recent Labs     04/21/23  1739 04/22/23  0335 04/23/23  0702 04/24/23  1026 04/25/23  0625   SODIUM 141 139 139 137 135*   POTASSIUM 4.3 4.3 4.6 4.4 4.1   CO2 30 29 27 23 25    BUN 20 24 41* 48* 18   CREATININE 5.12* 5.53* 7.25* 7.74* 4.36*   GLUCOSE 168* 100 103 228* 94     Recent Labs     04/21/23  1739   MAGNESIUM 2.1     Recent Labs     04/21/23  1739 04/21/23  2347 04/22/23  0335 04/23/23  0702 04/24/23  1208 04/25/23  0625   WBC 6.3 7.5 8.4 7.2 6.1 6.2   HGB 10.0* 9.5* 9.8* 8.9* 7.7* 8.9*   HCT 29.7* 28.2* 29.4* 27.6* 22.9* 26.3*   PLTCNT 183 188 183 181 159 211     Recent Labs  04/21/23  1739   AST 31   ALT 10     Recent Labs     04/22/23  0335 04/22/23  1028 04/22/23  1720 04/23/23  0055 04/23/23  0702 04/23/23  1410   APTT 44.0* 54.5* 34.1 64.9* 78.8* 60.8*     No results found for this encounter  Recent Labs     04/21/23  1739   TSH 0.022*     Recent Results (from the past 21308 hour(s))   LIPID PANEL    Collection Time: 04/22/23  3:35 AM   Result Value    CHOLESTEROL  107    HDL CHOL 37 (L)    TRIGLYCERIDES 106    LDL CALC 50    VLDL CALC 15    NON-HDL 70    CHOL/HDL RATIO 2.9            Personally reviewed by Hanley Seamen, NP    Rads:          Personally reviewed by Hanley Seamen, NP    Cardiovascular Workup:     Echo 10/18/20  Technically difficult due to patient body habitus.  The left ventricular ejection fraction by visual assessment is estimated to be 55-60%.  Mildly calcified aortic valve leaflets --especially the right coronary cusp.  There is a small mobile density on the aortic valve seen mainly in parasternal long axis views. No aortic valve stenosis or regurgitation.  The study images were of technically adequate quality.  Transthoracic complete echo with contrast, 2D, spectral and tissue Doppler, color flow Doppler, M-mode.  Concentric remodeling.  Left ventricular diastolic parameters are normal.  The mitral valve is not well visualized.  No evidence of  mitral stenosis.  There is mild mitral regurgitation.  Trace aortic regurgitation present.  The pulmonic valve is normal.  No significant pulmonic valve regurgitation present.  Trileaflet aortic valve.  There is a small mobile density on the aortic valve.  The interatrial septum is normal in appearance.  Normal IVC size with <50% inspiratory collapse (estimated RA pressure: 8 mmHg).  The aortic root is of normal size.  Normal pericardium with no pericardial effusion.     Echo 09/14/21  Normal left ventricular size.The left ventricular ejection fraction by visual assessment is estimated to be 60-65%.  Trace mitral regurgitation present.  Trace tricuspid regurgitation present.  There is mild aortic stenosis, mean PG 11 mmHg.     Stress Echo 09/15/22  This stress test was normal.      EKG 04/21/23  Sinus tachycardia  Anterior infarct  Marked ST abnormality, possible lateral subendocardial injury     LEFT HEART CATHETERIZATION/PCI OF THE PROXIMAL LAD April 23, 2023:  Successful angioplasty and stenting of the ostial to mid LAD.    Moderate to severe aortic valvular stenosis suggested with a peak to peak aortic valve gradient of 38 mmHg.  Severely reduced LV global systolic function with an EF of 30% and severely elevated LVEDP of 30.       Echo 04/24/2023  Normal left ventricular size.  Left ventricular systolic function is normal.  The left ventricular ejection fraction by visual assessment is estimated to be 65-70%.  Left ventricular diastolic parameters are normal.  Normal right ventricular systolic function.  There is moderate aortic stenosis.     Consider ordering a CT Heart Calcium, to determine aortic valve calcium score for further risk stratification.     EKG 04/24/2023  NS, resolution of ST depression lateral leads, twave inversions anterior  leads.        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 woman with     NSTEMI, Type I : Troponin 760/875/1138/1371/1210. EKG showing ST abnormality in lateral leads. left heart catheterization on April 23, 2023 shows critically diseased proximal LAD which was stented. LVEDP of 30 and left ventricular ejection fraction of 30% on LV angiography during catheterization April 23, 2023.Echo today shows EF 65-70%    Aortic stenosis: mild aortic stenosis reported on echo in December of 2022. Moderate to severe aortic valve stenosis suggested with a peak to peak aortic valve gradient of 38 mmhg by catheterization April 23, 2023. Echo 04/24/2023 moderate AS. Denies any shortness of breath, orthopnea or LE edema prior to presentations.     HTN:  Limited on medications due to end-stage renal disease  - BP 130-140's/ 50s    HLD: 04/21/23 FLP Total 107 Tri 106 HDL 37 LDL 50. On statin prior to admission.     DM  - A1C 6.4     Renal CA: s/p Left Radical Nephrectomy 2021    END STAGE RENAL DISEASE on HD    Status post parathyroidectomy, July of 2024    Obesity: BMI 37.66    Never smoker    Plan:     - Continue DAPT (aspirin/brilinta) and statin.   - Continue Labetalol 200 mg Q8H, Losartan 25 mg daily. (Home meds)  - Discussed importance of medication compliance.   - Recommend Cardiac rehab.  - SL nitro PRN for chest pain.   - Further work up of aortic stenosis as outpatient, currently asymptomatic and EF 65-70%.   - Follow up with cardiology outpatient in 1-2 weeks.     Cardiology will sign off.  Thank you for allowing [place] to participate in the care of your patient. Please feel free to contact us if further questions arise.    Hanley Seamen, NP  [facility]       I independently of the faculty provider spent a total of (30) minutes in direct care of this patient  including initial evaluation, review of laboratory, radiology, diagnostic studies, review of medical record, order entry and coordination of care.    Portions of this note may be dictated using voice recognition software or a dictation service. Variances in spelling and vocabulary are possible and unintentional. Not all errors are caught/corrected. Please notify the [facility]in if any discrepancies are noted, or if the meaning of any statement is not clear.     Attending physician Attestation:    Patient seen and examined  Agree with plan as aforementioned.   S/P PCI without complications  Stable for discharge to home    Thank you for consulting [facility] for the care of this patient. Please have the patient call the number below to make an appointment with Dr. Venita Lick MD within 10 days of hospital discharge.         Dr. Venita Lick MD  ABIM Board Certified In Cardiovascular Disease  ABIM Board Certified  In Internal Medicine  [facility], [address]  Ph: [phone]  Ph: [phone]) [phone]  (cell): [phone]

## 2023-04-25 NOTE — Care Plan (Signed)
Problem: Adult Inpatient Plan of Care  Goal: Plan of Care Review  04/25/2023 1534 by Herschell Dimes, RN  Outcome: Adequate for Discharge  04/25/2023 1053 by Herschell Dimes, RN  Outcome: Ongoing (see interventions/notes)  Goal: Patient-Specific Goal (Individualized)  04/25/2023 1534 by Herschell Dimes, RN  Outcome: Adequate for Discharge  04/25/2023 1053 by Herschell Dimes, RN  Outcome: Ongoing (see interventions/notes)  Goal: Absence of Hospital-Acquired Illness or Injury  04/25/2023 1534 by Herschell Dimes, RN  Outcome: Adequate for Discharge  04/25/2023 1053 by Herschell Dimes, RN  Outcome: Ongoing (see interventions/notes)  Intervention: Identify and Manage Fall Risk  Recent Flowsheet Documentation  Taken 04/25/2023 0800 by Herschell Dimes, RN  Safety Promotion/Fall Prevention: activity supervised  Intervention: Prevent Skin Injury  Recent Flowsheet Documentation  Taken 04/25/2023 0800 by Herschell Dimes, RN  Skin Protection: adhesive use limited  Intervention: Prevent and Manage VTE (Venous Thromboembolism) Risk  Recent Flowsheet Documentation  Taken 04/25/2023 0800 by Herschell Dimes, RN  VTE Prevention/Management: ambulation promoted  Goal: Optimal Comfort and Wellbeing  04/25/2023 1534 by Herschell Dimes, RN  Outcome: Adequate for Discharge  04/25/2023 1053 by Herschell Dimes, RN  Outcome: Ongoing (see interventions/notes)  Goal: Rounds/Family Conference  04/25/2023 1534 by Herschell Dimes, RN  Outcome: Adequate for Discharge  04/25/2023 1053 by Herschell Dimes, RN  Outcome: Ongoing (see interventions/notes)     Problem: Pain Acute  Goal: Optimal Pain Control and Function  04/25/2023 1534 by Herschell Dimes, RN  Outcome: Adequate for Discharge  04/25/2023 1053 by Herschell Dimes, RN  Outcome: Ongoing (see interventions/notes)  Intervention: Prevent or Manage Pain  Recent Flowsheet Documentation  Taken 04/25/2023 0800 by Herschell Dimes, RN  Sleep/Rest Enhancement: awakenings minimized  Medication Review/Management: medications  reviewed     Problem: Chest Pain  Goal: Resolution of Chest Pain Symptoms  04/25/2023 1534 by Herschell Dimes, RN  Outcome: Adequate for Discharge  04/25/2023 1053 by Herschell Dimes, RN  Outcome: Ongoing (see interventions/notes)

## 2023-04-26 ENCOUNTER — Telehealth (HOSPITAL_COMMUNITY): Payer: Self-pay

## 2023-05-07 NOTE — Progress Notes (Signed)
CARDIOLOGY [facility] [place]  [address]  [place] [address]  [phone]    Date: 05/08/2023  Patient Name: [name]  MRN#: [id number]  DOB: 11-26-1965    Provider: Army Melia, MD  PCP: Sallye Lat, NP      Reason for visit: Follow Up    History:     Merrisa Nease is a 57 y.o. female with a history of HTN, HLD, SOB, peripheral edema, and type 2 DM. She presents today as a new patient after a recent hospitalization for chest pain on 04/21/23. She described the pain as a sharp stabbing pain that worsened with exertion. She underwent heart cath on 04/23/23 that showed critically diseased proximal LAD, FS/p DES LAD stent.  Her LV systolic and diastolic function were severely impaired with  LV EF 30% w HK Anterior wall,  an LVEDP of 30.  There is moderate to severe aortic stenosis with a peak to peak gradient of 38 mmHg.     Today she reports feeling stable from a cardiac standpoint. She denies any further chest pain but does note SOB when walking long distances, which resolves in less than 30 seconds. She notes she has not yet been contacted for cardiac rehab.   She is compliant with taking her medications and tolerating them well. No c/o any CP, palpitations, syncope/presyncope, orthopnea/PND, LE edema, cough, or claudication.     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     Past Medical History:     Past Medical History:   Diagnosis Date    Anxiety     Arthritis     hips    Beta blocker prescribed for left ventricular systolic dysfunction     Labetalol     Cancer (CMS HCC) 2019, 2021    kidney, 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 body habitus; Left vent. EF est. 55-60%; mildy calcified aortic vlave leaflets-especially right coroanry cusp; Small mobile density on aortic valve-no aortic valve stenosis or regurg; Concentric remodeling; mild MR; Trace AR; Trileaflet aortic vlave; Nl IVC size wiht <50% insp. collapse;       Hyperlipidemia     Hyperparathyroidism (CMS HCC)     Hyperthyroidism  Old records states hyperthyroidism, patient denies, states has hyperparathyroidism    Irritable bowel syndrome     Macular edema     Morbid obesity (CMS HCC)     Obesity     Panic attack     Peripheral edema     ankles/feet    Peripheral neuropathy     bilateral feet    Pneumonia 07/2019; 09/2020    COVID Pneumonia 07/2019; fluid overload/pneumonia with recent hosp. 09/2020    Shortness of breath     SOB after climbing 8 steps     Type 2 diabetes mellitus (CMS HCC)     Wears glasses     reading    White coat hypertension          Past Surgical History:     Past Surgical History:   Procedure Laterality Date    AV FISTULA PLACEMENT Left 08/11/2020    LUE brachio cephalic fistula creation    ECTOPIC PREGNANCY SURGERY  05/24/1998    HX CATARACT REMOVAL Bilateral 2016    HX CHOLECYSTECTOMY  09/10/96    HX HAND SURGERY Right 2017    Trigger finger release 3rd finger    HX OTHER Right 10/18/2020    tunneled dialysis catheter    HX PELVIC LAPAROSCOPY  05/24/98     Ectopic pregnancy    HX SHOULDER SURGERY Left 2017    Procedure for frozen shoulder    HX TUBAL LIGATION  09/10/2012    Filshie Clips    HYMENECTOMY  1996    KIDNEY SURGERY  10/24/17; 03/24/20    LAPAROSCOPIC NEPHRECTOMY, HAND ASSISTED Left 03/24/2020    Radical    LAPAROSCOPIC PARTIAL NEPHRECTOMY Left 10/24/2017    hand-assisted    RENAL BIOPSY, PERCUTANEOUS  03/08/2020    clear cell renal cell carcinoma         Allergies:     Allergies   Allergen Reactions    Gabapentin Itching    Hydrocodone     Ibuprofen     Vicodin [Hydrocodone-Acetaminophen] Itching       Medications:     Outpatient Medications Marked as Taking for the 05/08/23 encounter (Office Visit) with Army Melia, MD   Medication Sig    ALPRAZolam Prudy Feeler) 0.25 mg Oral Tablet Take 1 Tablet (0.25 mg total) by mouth Once per day as needed for Anxiety    aspirin 81 mg Oral Tablet, Chewable Chew 1 Tablet (81 mg total) Once a day for 30 days    atorvastatin (LIPITOR) 40 mg Oral Tablet Take 1 Tablet (40 mg total) by mouth Every evening for 30 days    calcitrioL (ROCALTROL) 0.5 mcg Oral Capsule Take 2 Capsules (1 mcg total) by mouth Once a day for 30 days    cyclobenzaprine (FLEXERIL) 5 mg Oral Tablet Take 1 Tablet (5 mg total) by mouth Three times a day as needed for Muscle spasms    escitalopram oxalate (LEXAPRO) 10 mg Oral Tablet Take 1 Tablet (10 mg total) by mouth Every night    Fish Oil-Omega-3 Fatty Acids 360-1,200 mg Oral Capsule Take 2 Capsules by mouth Twice daily    fluticasone (FLONASE) 50 mcg/actuation Nasal Spray, Suspension Administer 1 Spray into each nostril Once per day as needed (Allergy symptoms (nasal congestion/ runny nose))    iron sucrose (VENOFER) 50 mg iron/2.5 mL Intravenous Solution 2.5 mL (50 mg total)    loperamide (IMODIUM) 2 mg Oral Capsule Take 2 Capsules (4 mg total) by mouth Every 6 hours as needed (  Diarrhea)    loratadine (CLARITIN) 10 mg Oral Tablet Take 1 Tablet (10 mg total) by mouth Once per day as needed (Allergies)     MOUNJARO 7.5 mg/0.5 mL Subcutaneous Pen Injector Inject 0.5 mL (7.5 mg total) under the skin Every 7 days    nitroGLYCERIN (NITROSTAT) 0.4 mg Sublingual Tablet, Sublingual Place 1 Tablet (0.4 mg total) under the tongue Every 5 minutes as needed for Chest pain for up to 10 doses for 3 doses over 15 minutes    omega-3-DHA-EPA-fish oil (FISH OIL) 1,000 mg (120 mg-180 mg) Oral Capsule Take by mouth    promethazine-dextromethorphan (PHENERGAN-DM) 6.25-15 mg/5 mL Oral Syrup TAKE 5 ML BY MOUTH 4 TIMES DAILY AS NEEDED FOR COUGH    sevelamer carbonate (RENVELA) 800 mg Oral Tablet Take 2 Tablets (1,600 mg total) by mouth    ticagrelor (BRILINTA) 90 mg Oral Tablet Take 1 Tablet (90 mg total) by mouth Twice daily for 30 days    traMADoL (ULTRAM) 50 mg Oral Tablet Take 1 Tablet (50 mg total) by mouth Every 6 hours as needed for Pain    TRIPHROCAPS 1 mg Oral Capsule Take 1 Capsule by mouth Every night    vitamin E 100 unit Oral Capsule Take 1 Capsule (100 Units total) by mouth Twice daily    zolpidem (AMBIEN) 5 mg Oral Tablet Take 1 Tablet (5 mg total) by mouth Every night as needed for Insomnia (use if melatonin is ineffective) for up to 30 days       Family History:     Family Medical History:       Problem Relation (Age of Onset)    Breast Cancer Paternal Aunt    Cancer Maternal Grandmother    Diabetes Father, Paternal Aunt    High Cholesterol Maternal Uncle, Mother    Hypertension (High Blood Pressure) Maternal Uncle, Father            Social History:     Social History     Socioeconomic History    Marital status: Married   Tobacco Use    Smoking status: Never    Smokeless tobacco: Never   Vaping Use    Vaping status: Never Used   Substance and Sexual Activity    Alcohol use: No    Drug use: No    Sexual activity: Yes     Partners: Male     Birth control/protection: Surgical   Other Topics Concern    Ability To Do Own ADL's Yes    Other Activity Level Yes     Comment: SOB after climbing approx. 8 steps, sedentary since recent  hosp. for fluid overload and CKD     Social Determinants of Health     Social Connections: Low Risk  (04/21/2023)    Social Connections     SDOH Social Isolation: 5 or more times a week    Received from [facility], [facility]    Housing Stability       Review of Systems:     All systems were reviewed and are negative other than noted in the HPI.    Physical Exam:     Vitals:    05/08/23 1424   BP: 111/72   Pulse: 84   SpO2: 99%   Weight: 106 kg (233 lb)   Height: 1.702 m (5' 7.01")   BMI: 36.56       Gen: Awake, alert, and in no acute distress  Neck: No JVD, no HJR  Cardiac: Normal S1 S2, RRR, No murmur, rubs, clicks, or gallops  Lungs: CTA bilaterally  Abdomen:+BS,  NT    PMI is inferiorly and laterally displaced   Vascular:   No carotid or abdominal bruits,   2+ radial pulses  2+ PT pulses  2+ DP pulses   Extremities: No edema    Cardiovascular Workup:     Echo 10/18/20  Technically difficult due to patient body habitus.  The left ventricular ejection fraction by visual assessment is estimated to be 55-60%.  Mildly calcified aortic valve leaflets --especially the right coronary cusp.  There is a small mobile density on the aortic valve seen mainly in parasternal long axis views. No aortic valve stenosis or regurgitation.  Subsequent echo w AS  The study images were of technically adequate quality.  Transthoracic complete echo with contrast, 2D, spectral and tissue Doppler, color flow Doppler, M-mode.  Concentric remodeling.  Left ventricular diastolic parameters are normal.  The mitral valve is not well visualized.  No evidence of mitral stenosis.  There is mild mitral regurgitation.  Trace aortic regurgitation present.  The pulmonic valve is normal.  No significant pulmonic valve regurgitation present.  Trileaflet aortic valve.  There is a small mobile density on the aortic valve.  The interatrial septum is normal in appearance.  Normal IVC size with <50% inspiratory collapse (estimated RA  pressure: 8 mmHg).  The aortic root is of normal size.  Normal pericardium with no pericardial effusion.     Echo 09/14/21  Normal left ventricular size.The left ventricular ejection fraction by visual assessment is estimated to be 60-65%.  Trace mitral regurgitation present.  Trace tricuspid regurgitation present.  There is mild aortic stenosis, mean PG 11 mmHg.     Stress Echo 09/15/22  This stress test was normal.      EKG 04/21/23  Sinus tachycardia  Anterior infarct  Marked ST abnormality, possible lateral subendocardial injury     LEFT HEART CATHETERIZATION/PCI OF THE PROXIMAL LAD April 23, 2023:  Successful angioplasty and stenting of the ostial to mid LAD.    Moderate to severe aortic valvular stenosis suggested with a peak to peak aortic valve gradient of 38 mmHg.  Severely reduced LV global systolic function with an EF of 30% and severely elevated LVEDP of 30.      Echo 04/24/23  Normal left ventricular size.   Left ventricular systolic function is normal.   The left ventricular ejection fraction by visual assessment is estimated to be 65-70%.   Left ventricular diastolic parameters are normal.   Normal right ventricular systolic function.   There is moderate aortic stenosis.     Assessment and Plan:     Erykah Muff is a 57 y.o. female with    NSTEMI, Type I   -Troponin 760/875/1138/1371/1210  - left heart catheterization on April 23, 2023 Proximal LAD CAD, s/p DES   - will start coreg 3.125 mg BID as below      HFrEF:  - 04/23/23 Cath:    LVEDP of 30 and left ventricular ejection fraction of 30% on LV angiography during catheterization April 23, 2023.  See echo  stunning recovered  - 04/24/23 echo w remarkably recovered LV EF - thus no lifevest at this time  - re ck echo if symptoms of HF or AS arise, otherwise re ck echo 6 mo for mod-severe AS       Aortic valvular stenosis:   - mild aortic stenosis reported on echo in 04/24/23 and  08/2021 echo  - now moderate to severe aortic valve stenosis suggested with a  peak to peak aortic valve gradient of 38 mmhg by catheterization April 23, 2023     HTN controlled today  - will d/c labetalol and hydralazine in favor of starting coreg 3.125 mg BID   - limited on medications due to end-stage renal disease   - BP 110-130's/ 50s     HLD  - 04/22/23 FLP Total 107 Tri 106 HDL 37 LDL 50     DM  - A1C 6.4     Renal CA  - s/p left radical nephrectomy 2021     ESRD  -on HD  -Cr 7.25     Hyperthyroid     Status post parathyroidectomy, July of 2024     Obesity  - BMI 37.66     Nonsmoker    Follow up in 5 weeks     Thank you for allowing me to participate in the care of your patient. Please feel free to contact me if there are further questions.     I am scribing for, and in the presence of Dr. Army Melia, MD for services provided on 05/08/2023.  Lauren Aidoo, SCRIBE   Lauren Aidoo, SCRIBE, 05/08/2023, 14:55    I personally performed the services described in this documentation, as scribed  in my presence, and it is both accurate  and complete.    Army Melia, MD [facility]

## 2023-05-08 ENCOUNTER — Other Ambulatory Visit: Payer: Self-pay

## 2023-05-08 ENCOUNTER — Ambulatory Visit: Payer: 59 | Attending: CARDIOVASCULAR DISEASE | Admitting: CARDIOVASCULAR DISEASE

## 2023-05-08 ENCOUNTER — Encounter (HOSPITAL_COMMUNITY): Payer: Self-pay | Admitting: CARDIOVASCULAR DISEASE

## 2023-05-08 VITALS — BP 111/72 | HR 84 | Ht 67.01 in | Wt 233.0 lb

## 2023-05-08 DIAGNOSIS — Z955 Presence of coronary angioplasty implant and graft: Secondary | ICD-10-CM

## 2023-05-08 DIAGNOSIS — Z992 Dependence on renal dialysis: Secondary | ICD-10-CM

## 2023-05-08 DIAGNOSIS — I132 Hypertensive heart and chronic kidney disease with heart failure and with stage 5 chronic kidney disease, or end stage renal disease: Secondary | ICD-10-CM

## 2023-05-08 DIAGNOSIS — I350 Nonrheumatic aortic (valve) stenosis: Secondary | ICD-10-CM

## 2023-05-08 DIAGNOSIS — I5020 Unspecified systolic (congestive) heart failure: Secondary | ICD-10-CM | POA: Insufficient documentation

## 2023-05-08 DIAGNOSIS — N186 End stage renal disease: Secondary | ICD-10-CM

## 2023-05-08 DIAGNOSIS — I214 Non-ST elevation (NSTEMI) myocardial infarction: Secondary | ICD-10-CM | POA: Insufficient documentation

## 2023-05-08 DIAGNOSIS — E1122 Type 2 diabetes mellitus with diabetic chronic kidney disease: Secondary | ICD-10-CM

## 2023-05-08 MED ORDER — CARVEDILOL 3.125 MG TABLET: 3.1250 mg | ORAL_TABLET | Freq: Two times a day (BID) | ORAL | 3 refills | Status: DC

## 2023-05-08 NOTE — Patient Instructions (Addendum)
Do not take coreg in the morning on dialysis days, may take evening dose.

## 2023-05-09 ENCOUNTER — Ambulatory Visit (HOSPITAL_COMMUNITY): Payer: 59 | Admitting: CARDIOVASCULAR DISEASE

## 2023-05-10 ENCOUNTER — Ambulatory Visit (HOSPITAL_COMMUNITY): Payer: Self-pay | Admitting: CARDIOVASCULAR DISEASE

## 2023-05-10 NOTE — Telephone Encounter (Signed)
Per Dr. Eulah Pont, Pt can return to work with no restrictions. Faxed letter to pt's employer.    Fax: OK    To: [phone]     Job #: [id number]  Fax ID: [id number]  Pages: 1     Lanier Prude, RN

## 2023-05-10 NOTE — Telephone Encounter (Signed)
-----   Message from Army Melia sent at 05/10/2023 12:11 AM EDT -----  ----- Message from Florene Glen sent at 05/09/2023  4:21 PM EDT -----  Denton Meek the daughter of the patient listed above is in the waiting area and is requesting a note to state the patient is able to return to work.

## 2023-05-11 ENCOUNTER — Ambulatory Visit (HOSPITAL_COMMUNITY): Payer: Self-pay | Admitting: CARDIOVASCULAR DISEASE

## 2023-05-11 NOTE — Telephone Encounter (Signed)
While patient was in the office to pick up work note, expressed concerns over kidney transplant that she missed out on due to "narrowing of a heart valve." Pt wants to know what she needs to do to take care of this in order to be eligible for kidney transplant. Called patient and left message to discuss. Upcoming appt on 06/14/23.     Lanier Prude, RN

## 2023-05-21 ENCOUNTER — Other Ambulatory Visit (HOSPITAL_COMMUNITY): Payer: Self-pay | Admitting: CARDIOVASCULAR DISEASE

## 2023-05-21 NOTE — Telephone Encounter (Signed)
Pt called and needs sooner appt to discuss valve issue with Dr. Eulah Pont. States she missed out on a kidney transplant because of it. Moved appt up to 9/5 will monitor for any sooner 8am appts. Pt also needs refill of brilinta. Will send request to provider. Last office visit 05/08/23.    Lanier Prude, RN

## 2023-05-22 MED ORDER — TICAGRELOR 90 MG TABLET
90.0000 mg | ORAL_TABLET | Freq: Two times a day (BID) | ORAL | 3 refills | Status: DC
Start: 2023-05-22 — End: 2024-05-13

## 2023-05-23 NOTE — Progress Notes (Signed)
CARDIOLOGY [facility], [facility] CENTER  [address]  [place] [address]  [phone]    Date: 05/31/2023  Patient Name: [name]  MRN#: [id number]  DOB: 29-Jan-1966    Provider: Army Melia, MD  PCP: Sallye Lat, NP      Reason for visit: Follow Up (HFrEF)    History:     [name] is a 57 y.o. female with a history of HTN, HLD, SOB, peripheral edema, and type 2 DM. She was last seen on 05/08/23 when she reported being stable from a cardiac standpoint since her hospital discharge, only noting of continued SOB w/ walking long distances that resolves in <30 seconds. 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     Nurse Triage message on 05/11/23 when patient called about concerns over a kidney transplant that she missed out on due to a "narrowing heart valve". Pt requested an earlier appt  to discuss what she needs to do to be eligible for transplant.     Today she reports feeling well from a cardiac standpoint. She has been staying active through hiking with her daughter without any limiting or exertional symptoms.  No c/o any CP, SOB, palpitations, syncope/presyncope, orthopnea/PND, LE edema, or claudication. She is compliant with taking her medications and tolerating them well. She states that she missed out on a kidney transplant due to her moderate AS.     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     Past Medical History:     Past Medical History:   Diagnosis Date    Anxiety     Arthritis     hips    Beta blocker prescribed for left ventricular systolic dysfunction     Labetalol     Cancer (CMS HCC) 2019, 2021    kidney, 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 body habitus; Left vent. EF est. 55-60%; mildy calcified aortic vlave leaflets-especially right coroanry cusp; Small mobile density on aortic valve-no aortic valve stenosis or regurg; Concentric remodeling; mild MR; Trace AR; Trileaflet aortic vlave; Nl IVC size wiht <50% insp. collapse;       Hyperlipidemia  Hyperparathyroidism (CMS HCC)     Hyperthyroidism     Old records states hyperthyroidism, patient denies, states has hyperparathyroidism    Irritable bowel syndrome     Macular edema     Morbid obesity (CMS HCC)     Obesity     Panic attack     Peripheral edema     ankles/feet    Peripheral neuropathy     bilateral feet    Pneumonia 07/2019; 09/2020    COVID Pneumonia 07/2019; fluid overload/pneumonia with recent hosp. 09/2020    Shortness of breath     SOB after climbing 8 steps     Type 2 diabetes mellitus (CMS HCC)     Wears glasses     reading    White coat hypertension          Past Surgical History:     Past Surgical History:   Procedure Laterality Date    AV FISTULA PLACEMENT Left 08/11/2020    LUE brachio cephalic fistula creation    ECTOPIC PREGNANCY SURGERY  05/24/1998    HX CATARACT REMOVAL Bilateral 2016    HX CHOLECYSTECTOMY  09/10/96    HX HAND SURGERY Right 2017    Trigger finger release 3rd  finger    HX OTHER Right 10/18/2020    tunneled dialysis catheter    HX PELVIC LAPAROSCOPY  05/24/98    Ectopic pregnancy    HX SHOULDER SURGERY Left 2017    Procedure for frozen shoulder    HX TUBAL LIGATION  09/10/2012    Filshie Clips    HYMENECTOMY  1996    KIDNEY SURGERY  10/24/17; 03/24/20    LAPAROSCOPIC NEPHRECTOMY, HAND ASSISTED Left 03/24/2020    Radical    LAPAROSCOPIC PARTIAL NEPHRECTOMY Left 10/24/2017    hand-assisted    RENAL BIOPSY, PERCUTANEOUS  03/08/2020    clear cell renal cell carcinoma         Allergies:     Allergies   Allergen Reactions    Gabapentin Itching    Hydrocodone     Ibuprofen     Vicodin [Hydrocodone-Acetaminophen] Itching       Medications:     Outpatient Medications Marked as Taking for the 05/31/23 encounter (Office Visit) with Army Melia, MD   Medication Sig    ALPRAZolam Prudy Feeler) 0.25 mg Oral Tablet Take 1 Tablet (0.25 mg total) by mouth Once per day as needed for Anxiety    aspirin 81 mg Oral Tablet, Chewable Chew 1 Tablet (81 mg total) Once a day for 30 days    atorvastatin (LIPITOR) 40 mg Oral Tablet Take 1 Tablet (40 mg total) by mouth Every evening for 30 days    calcitrioL (ROCALTROL) 0.5 mcg Oral Capsule Take 2 Capsules (1 mcg total) by mouth Once a day for 30 days    carvediloL (COREG) 3.125 mg Oral Tablet Take 1 Tablet (3.125 mg total) by mouth Twice daily with food    cyclobenzaprine (FLEXERIL) 5 mg Oral Tablet Take 1 Tablet (5 mg total) by mouth Three times a day as needed for Muscle spasms    escitalopram oxalate (LEXAPRO) 10 mg Oral Tablet Take 1 Tablet (10 mg total) by mouth Every night    Fish Oil-Omega-3 Fatty Acids 360-1,200 mg Oral Capsule Take 2 Capsules by mouth Twice daily    fluticasone (FLONASE) 50 mcg/actuation Nasal Spray, Suspension Administer 1 Spray into each nostril Once per day as needed (Allergy symptoms (nasal congestion/ runny nose))    iron sucrose (VENOFER)  50 mg iron/2.5 mL Intravenous Solution 2.5 mL (50 mg total)    loperamide (IMODIUM) 2 mg  Oral Capsule Take 2 Capsules (4 mg total) by mouth Every 6 hours as needed (Diarrhea)    loratadine (CLARITIN) 10 mg Oral Tablet Take 1 Tablet (10 mg total) by mouth Once per day as needed (Allergies)    losartan (COZAAR) 25 mg Oral Tablet Take 1 Tablet (25 mg total) by mouth Once a day for 30 days (Patient taking differently: Take 1 Tablet (25 mg total) by mouth Take in the evening on non-dialysis days (Sun/Tues/Thurs/Sat); does not take on dialysis days)    MOUNJARO 7.5 mg/0.5 mL Subcutaneous Pen Injector Inject 0.5 mL (7.5 mg total) under the skin Every 7 days    nitroGLYCERIN (NITROSTAT) 0.4 mg Sublingual Tablet, Sublingual Place 1 Tablet (0.4 mg total) under the tongue Every 5 minutes as needed for Chest pain for up to 10 doses for 3 doses over 15 minutes    omega-3-DHA-EPA-fish oil (FISH OIL) 1,000 mg (120 mg-180 mg) Oral Capsule Take by mouth    promethazine-dextromethorphan (PHENERGAN-DM) 6.25-15 mg/5 mL Oral Syrup TAKE 5 ML BY MOUTH 4 TIMES DAILY AS NEEDED FOR COUGH    sevelamer carbonate (RENVELA) 800 mg Oral Tablet Take 2 Tablets (1,600 mg total) by mouth    ticagrelor (BRILINTA) 90 mg Oral Tablet Take 1 Tablet (90 mg total) by mouth Twice daily    traMADoL (ULTRAM) 50 mg Oral Tablet Take 1 Tablet (50 mg total) by mouth Every 6 hours as needed for Pain    TRIPHROCAPS 1 mg Oral Capsule Take 1 Capsule by mouth Every night    vitamin E 100 unit Oral Capsule Take 1 Capsule (100 Units total) by mouth Twice daily    zolpidem (AMBIEN) 5 mg Oral Tablet Take 1 Tablet (5 mg total) by mouth Every night as needed for Insomnia (use if melatonin is ineffective) for up to 30 days       Family History:     Family Medical History:       Problem Relation (Age of Onset)    Breast Cancer Paternal Aunt    Cancer Maternal Grandmother    Diabetes Father, Paternal Aunt    High Cholesterol Maternal Uncle, Mother    Hypertension (High Blood Pressure) Maternal Uncle, Father            Social History:     Social History      Socioeconomic History    Marital status: Married   Tobacco Use    Smoking status: Never    Smokeless tobacco: Never   Vaping Use    Vaping status: Never Used   Substance and Sexual Activity    Alcohol use: No    Drug use: No    Sexual activity: Yes     Partners: Male     Birth control/protection: Surgical   Other Topics Concern    Ability To Do Own ADL's Yes    Other Activity Level Yes     Comment: SOB after climbing approx. 8 steps, sedentary since recent hosp. for fluid overload and CKD     Social Determinants of Health     Social Connections: Low Risk  (04/21/2023)    Social Connections     SDOH Social Isolation: 5 or more times a week    Received from [facility], [facility]    Housing Stability       Review of Systems:     All  systems were reviewed and are negative other than noted in the HPI.    Physical Exam:     Vitals:    05/31/23 0916 05/31/23 0926   BP: 138/73 127/66   Pulse: 80 84   SpO2: 99%    Weight: 108 kg (238 lb 1.6 oz)    Height: 1.702 m (5\' 7" )    BMI: 37.37          Gen: Awake, alert, and in no acute distress  Neck: No JVD, no HJR  Cardiac: Normal S1 S2, RRR, Mid peaking crescendo and decrescendo AV murmur loudest at the RUSB, no rubs, no clicks, or no gallops  Lungs: CTA bilaterally  Abdomen:+BS,  NT    PMI is inferiorly and laterally displaced   Vascular:   2+ radial pulses  2+ PT pulses  2+ DP pulses   Extremities: No edema    Cardiovascular Workup:     Echo 10/18/20  Technically difficult due to patient body habitus.  The left ventricular ejection fraction by visual assessment is estimated to be 55-60%.  Mildly calcified aortic valve leaflets --especially the right coronary cusp.  There is a small mobile density on the aortic valve seen mainly in parasternal long axis views. No aortic valve stenosis or regurgitation.  Subsequent echo w AS  The study images were of technically adequate quality.  Transthoracic complete echo with contrast, 2D, spectral and tissue  Doppler, color flow Doppler, M-mode.  Concentric remodeling.  Left ventricular diastolic parameters are normal.  The mitral valve is not well visualized.  No evidence of mitral stenosis.  There is mild mitral regurgitation.  Trace aortic regurgitation present.  The pulmonic valve is normal.  No significant pulmonic valve regurgitation present.  Trileaflet aortic valve.  There is a small mobile density on the aortic valve.  The interatrial septum is normal in appearance.  Normal IVC size with <50% inspiratory collapse (estimated RA pressure: 8 mmHg).  The aortic root is of normal size.  Normal pericardium with no pericardial effusion.     Echo 09/14/21  Normal left ventricular size.The left ventricular ejection fraction by visual assessment is estimated to be 60-65%.  Trace mitral regurgitation present.  Trace tricuspid regurgitation present.  There is mild aortic stenosis, mean PG 11 mmHg.     Stress Echo 09/15/22  This stress test was normal.      EKG 04/21/23  Sinus tachycardia  Anterior infarct  Marked ST abnormality, possible lateral subendocardial injury     LEFT HEART CATHETERIZATION/PCI OF THE PROXIMAL LAD April 23, 2023:  Successful angioplasty and stenting of the ostial to mid LAD.    Moderate to severe aortic valvular stenosis suggested with a peak to peak aortic valve gradient of 38 mmHg.  Severely reduced LV global systolic function with an EF of 30% and severely elevated LVEDP of 30.      Echo 04/24/23  Normal left ventricular size.   Left ventricular systolic function is normal.   The left ventricular ejection fraction by visual assessment is estimated to be 65-70%.   Left ventricular diastolic parameters are normal.   Normal right ventricular systolic function.   There is moderate aortic stenosis.     Assessment and Plan:     [name] is a 57 y.o. female with    NSTEMI, Type I   -Troponin 760/875/1138/1371/1210  - left heart catheterization on April 23, 2023 Proximal LAD CAD, s/p DES   - continue on  ASA, Brilinta, BB, and statin  HFrEF:  - 04/23/23 Cath:    LVEDP of 30 and left ventricular ejection fraction of 30% on LV angiography during catheterization April 23, 2023.  See echo  stunning recovered  - 04/24/23 echo w remarkably recovered LV EF - thus no lifevest at this time  - re ck echo if symptoms of HF or AS arise, otherwise re ck echo 6 mo for mod-severe AS     Aortic valvular stenosis:   - mild aortic stenosis reported on echo in 04/24/23 and 08/2021 echo  - moderate to severe aortic valve stenosis suggested with a peak to peak aortic valve gradient of 38 mmhg by catheterization April 23, 2023  - on PE today   - will contact [facility] cardiology team later today to discuss AVR given pts ERSD  - will also contact Lajean Saver, her transplant moderator to discuss her case. Contact information: Phone: [phone], e-mail: [email or web address]    HTN controlled today    - renal function panel 04/25/23 K+ 4.1, Cr 4.36  -  continue on current meds  - limited on medications due to end-stage renal disease   - BP 110-130's/ 50s     HLD: FLP 04/22/23 Total 107 Tri 106 HDL 37 LDL 50  - on statin   - as well as fish oil     DM  - A1C 6.4 on 02/16/22     Renal CA  - s/p left radical nephrectomy 2021     ESRD  -on HD  - renal function panel 04/25/23 Cr 4.36  - transplant moderator contact information as above      Hyperthyroid     Status post parathyroidectomy, July of 2024     Obesity  - BMI 37.29     Nonsmoker     Thank you for allowing me to participate in the care of your patient. Please feel free to contact me if there are further questions.     I am scribing for, and in the presence of Dr. Army Melia, MD for services provided on 05/31/2023.  Irving Burton Case, SCRIBE      Emily Case, SCRIBE, 05/31/2023, 09:55    I personally performed the services described in this documentation, as scribed in my presence, and it is both accurate and complete.     Charlann Boxer MD [facility]  Vernona Rieger AM Eulah Pont, MD [facility]

## 2023-05-31 ENCOUNTER — Ambulatory Visit: Payer: 59 | Attending: CARDIOVASCULAR DISEASE | Admitting: CARDIOVASCULAR DISEASE

## 2023-05-31 ENCOUNTER — Other Ambulatory Visit: Payer: Self-pay

## 2023-05-31 ENCOUNTER — Encounter (HOSPITAL_COMMUNITY): Payer: Self-pay | Admitting: CARDIOVASCULAR DISEASE

## 2023-05-31 VITALS — BP 127/66 | HR 84 | Ht 67.0 in | Wt 238.1 lb

## 2023-05-31 DIAGNOSIS — N186 End stage renal disease: Secondary | ICD-10-CM

## 2023-05-31 DIAGNOSIS — Z955 Presence of coronary angioplasty implant and graft: Secondary | ICD-10-CM

## 2023-05-31 DIAGNOSIS — I132 Hypertensive heart and chronic kidney disease with heart failure and with stage 5 chronic kidney disease, or end stage renal disease: Secondary | ICD-10-CM

## 2023-05-31 DIAGNOSIS — I252 Old myocardial infarction: Secondary | ICD-10-CM

## 2023-05-31 DIAGNOSIS — I350 Nonrheumatic aortic (valve) stenosis: Secondary | ICD-10-CM | POA: Insufficient documentation

## 2023-05-31 DIAGNOSIS — I2510 Atherosclerotic heart disease of native coronary artery without angina pectoris: Secondary | ICD-10-CM

## 2023-05-31 DIAGNOSIS — I5030 Unspecified diastolic (congestive) heart failure: Secondary | ICD-10-CM

## 2023-05-31 DIAGNOSIS — Z992 Dependence on renal dialysis: Secondary | ICD-10-CM

## 2023-05-31 DIAGNOSIS — E785 Hyperlipidemia, unspecified: Secondary | ICD-10-CM

## 2023-05-31 DIAGNOSIS — Z85528 Personal history of other malignant neoplasm of kidney: Secondary | ICD-10-CM

## 2023-05-31 DIAGNOSIS — Z905 Acquired absence of kidney: Secondary | ICD-10-CM

## 2023-05-31 MED ORDER — ATORVASTATIN 40 MG TABLET: 40.0000 mg | ORAL_TABLET | Freq: Every evening | ORAL | 3 refills | Status: DC

## 2023-06-01 ENCOUNTER — Encounter (HOSPITAL_COMMUNITY): Payer: Self-pay | Admitting: CARDIOVASCULAR DISEASE

## 2023-06-14 ENCOUNTER — Ambulatory Visit (HOSPITAL_COMMUNITY): Payer: 59 | Admitting: CARDIOVASCULAR DISEASE

## 2023-06-15 ENCOUNTER — Ambulatory Visit (HOSPITAL_COMMUNITY): Payer: Self-pay | Admitting: CARDIOVASCULAR DISEASE

## 2023-06-15 DIAGNOSIS — I350 Nonrheumatic aortic (valve) stenosis: Secondary | ICD-10-CM

## 2023-06-15 NOTE — Telephone Encounter (Signed)
-----   Message from Cindie Laroche sent at 06/15/2023 10:45 AM EDT -----  Pt is requesting medical records and a referral to be sent from our office to [facility] cardiology, phone to be reached is Willaim Sheng- pt's nurse transplant coordinator at [phone], fax number for Willaim Sheng is [phone], fax for the cardiology office [phone], thanks!

## 2023-06-15 NOTE — Telephone Encounter (Signed)
Per Dr. Eulah Pont:     "We can refer. I'll look more at her data this weekend, but we can refer now."    External referral placed to [facility].     Lanier Prude, RN

## 2023-06-29 ENCOUNTER — Other Ambulatory Visit (HOSPITAL_COMMUNITY): Payer: Self-pay | Admitting: Nurse Practitioner

## 2023-06-29 ENCOUNTER — Encounter: Payer: Self-pay | Admitting: PHYSICIAN ASSISTANT

## 2023-06-29 DIAGNOSIS — R791 Abnormal coagulation profile: Secondary | ICD-10-CM

## 2023-07-04 ENCOUNTER — Ambulatory Visit
Admission: RE | Admit: 2023-07-04 | Discharge: 2023-07-04 | Disposition: A | Discharge status: Home / Self Care | Payer: 59 | Source: Ambulatory Visit | Attending: Nurse Practitioner | Admitting: Nurse Practitioner

## 2023-07-04 ENCOUNTER — Other Ambulatory Visit: Payer: Self-pay

## 2023-07-04 DIAGNOSIS — T82858A Stenosis of vascular prosthetic devices, implants and grafts, initial encounter: Secondary | ICD-10-CM | POA: Insufficient documentation

## 2023-07-04 DIAGNOSIS — Y832 Surgical operation with anastomosis, bypass or graft as the cause of abnormal reaction of the patient, or of later complication, without mention of misadventure at the time of the procedure: Secondary | ICD-10-CM | POA: Insufficient documentation

## 2023-07-04 DIAGNOSIS — R791 Abnormal coagulation profile: Secondary | ICD-10-CM | POA: Insufficient documentation

## 2023-07-04 MED ORDER — HEPARIN (PORCINE) (PF) 2,000 UNIT/1,000 ML IN 0.9 % SODIUM CHLORIDE IV
INTRAVENOUS | Status: AC
Start: 2023-07-04 — End: 2023-07-04
  Filled 2023-07-04: qty 1000

## 2023-07-04 MED ORDER — HEPARIN (PORCINE) 1,000 UNIT/ML INJECTION SOLUTION
Freq: Once | INTRAMUSCULAR | Status: AC | PRN
Start: 2023-07-04 — End: 2023-07-04
  Administered 2023-07-04: 1000 [IU] via INTRAVENOUS

## 2023-07-04 MED ORDER — FENTANYL (PF) 50 MCG/ML INJECTION SOLUTION
INTRAMUSCULAR | Status: AC
Start: 2023-07-04 — End: 2023-07-04
  Filled 2023-07-04: qty 2

## 2023-07-04 MED ORDER — FENTANYL (PF) 50 MCG/ML INJECTION SOLUTION
Freq: Once | INTRAMUSCULAR | Status: AC | PRN
Start: 2023-07-04 — End: 2023-07-04
  Administered 2023-07-04: 100 ug via INTRAVENOUS

## 2023-07-04 MED ORDER — LIDOCAINE HCL 10 MG/ML (1 %) INJECTION SOLUTION
INTRAMUSCULAR | Status: AC
Start: 2023-07-04 — End: 2023-07-04
  Filled 2023-07-04: qty 20

## 2023-07-04 MED ORDER — IOHEXOL 300 MG IODINE/ML INTRAVENOUS SOLUTION
Freq: Once | INTRAVENOUS | Status: AC | PRN
Start: 2023-07-04 — End: 2023-07-04
  Administered 2023-07-04: 20 mL via INTRAVENOUS

## 2023-07-04 MED ORDER — LIDOCAINE HCL 10 MG/ML (1 %) INJECTION SOLUTION
Freq: Once | INTRAMUSCULAR | Status: AC | PRN
Start: 2023-07-04 — End: 2023-07-04
  Administered 2023-07-04: 5 mL via INTRADERMAL

## 2023-07-04 MED ORDER — HEPARIN (PORCINE) 1,000 UNIT/ML INJECTION SOLUTION
INTRAMUSCULAR | Status: AC
Start: 2023-07-04 — End: 2023-07-04
  Filled 2023-07-04: qty 1

## 2023-07-06 ENCOUNTER — Other Ambulatory Visit: Payer: Self-pay

## 2023-07-06 ENCOUNTER — Emergency Department (HOSPITAL_COMMUNITY): Payer: 59

## 2023-07-06 ENCOUNTER — Encounter (HOSPITAL_COMMUNITY): Payer: Self-pay

## 2023-07-06 ENCOUNTER — Emergency Department
Admission: EM | Admit: 2023-07-06 | Discharge: 2023-07-06 | Disposition: A | Discharge status: Home / Self Care | Payer: 59 | Source: Home / Self Care | Attending: Emergency Medicine | Admitting: Emergency Medicine

## 2023-07-06 DIAGNOSIS — E1122 Type 2 diabetes mellitus with diabetic chronic kidney disease: Secondary | ICD-10-CM | POA: Insufficient documentation

## 2023-07-06 DIAGNOSIS — E1136 Type 2 diabetes mellitus with diabetic cataract: Secondary | ICD-10-CM | POA: Insufficient documentation

## 2023-07-06 DIAGNOSIS — Z7985 Long-term (current) use of injectable non-insulin antidiabetic drugs: Secondary | ICD-10-CM | POA: Insufficient documentation

## 2023-07-06 DIAGNOSIS — E1142 Type 2 diabetes mellitus with diabetic polyneuropathy: Secondary | ICD-10-CM | POA: Insufficient documentation

## 2023-07-06 DIAGNOSIS — R079 Chest pain, unspecified: Secondary | ICD-10-CM

## 2023-07-06 DIAGNOSIS — R9431 Abnormal electrocardiogram [ECG] [EKG]: Secondary | ICD-10-CM | POA: Insufficient documentation

## 2023-07-06 DIAGNOSIS — R0602 Shortness of breath: Secondary | ICD-10-CM | POA: Insufficient documentation

## 2023-07-06 DIAGNOSIS — R0789 Other chest pain: Secondary | ICD-10-CM | POA: Insufficient documentation

## 2023-07-06 DIAGNOSIS — Z992 Dependence on renal dialysis: Secondary | ICD-10-CM | POA: Insufficient documentation

## 2023-07-06 DIAGNOSIS — Z1152 Encounter for screening for COVID-19: Secondary | ICD-10-CM | POA: Insufficient documentation

## 2023-07-06 DIAGNOSIS — N186 End stage renal disease: Secondary | ICD-10-CM | POA: Insufficient documentation

## 2023-07-06 DIAGNOSIS — R000 Tachycardia, unspecified: Secondary | ICD-10-CM | POA: Insufficient documentation

## 2023-07-06 DIAGNOSIS — I120 Hypertensive chronic kidney disease with stage 5 chronic kidney disease or end stage renal disease: Secondary | ICD-10-CM | POA: Insufficient documentation

## 2023-07-06 DIAGNOSIS — E875 Hyperkalemia: Secondary | ICD-10-CM | POA: Insufficient documentation

## 2023-07-06 LAB — COVID-19, FLU A/B, RSV RAPID BY PCR
INFLUENZA VIRUS TYPE A: NOT DETECTED
INFLUENZA VIRUS TYPE B: NOT DETECTED
RESPIRATORY SYNCTIAL VIRUS (RSV): NOT DETECTED
SARS-CoV-2: NOT DETECTED

## 2023-07-06 LAB — ECG 12-LEAD
Atrial Rate: 102 {beats}/min
Calculated P Axis: 35 degrees
Calculated R Axis: -6 degrees
Calculated T Axis: 54 degrees
PR Interval: 154 ms
QRS Duration: 78 ms
QT Interval: 346 ms
QTC Calculation: 450 ms
Ventricular rate: 102 {beats}/min

## 2023-07-06 LAB — CBC WITH DIFF
BASOPHIL #: <0.1 10*3/uL (ref <=0.20)
BASOPHIL %: 0.2 %
EOSINOPHIL #: <0.1 10*3/uL (ref <=0.50)
EOSINOPHIL %: 0.9 %
HCT: 34.4 % — ABNORMAL LOW (ref 34.8–46.0)
HGB: 11.3 g/dL — ABNORMAL LOW (ref 11.5–16.0)
IMMATURE GRANULOCYTE #: <0.1 10*3/uL (ref <0.10)
IMMATURE GRANULOCYTE %: 0.4 % (ref 0.0–1.0)
LYMPHOCYTE #: 1.05 10*3/uL (ref 1.00–4.80)
LYMPHOCYTE %: 9.9 %
MCH: 34.2 pg — ABNORMAL HIGH (ref 26.0–32.0)
MCHC: 32.8 g/dL (ref 31.0–35.5)
MCV: 104.2 fL — ABNORMAL HIGH (ref 78.0–100.0)
MONOCYTE #: 0.51 10*3/uL (ref 0.20–1.10)
MONOCYTE %: 4.8 %
MPV: 10.3 fL (ref 8.7–12.5)
NEUTROPHIL #: 8.85 10*3/uL — ABNORMAL HIGH (ref 1.50–7.70)
NEUTROPHIL %: 83.8 %
PLATELETS: 212 10*3/uL (ref 150–400)
RBC: 3.3 10*6/uL — ABNORMAL LOW (ref 3.85–5.22)
RDW-CV: 14.8 % (ref 11.5–15.5)
WBC: 10.6 10*3/uL (ref 3.7–11.0)

## 2023-07-06 LAB — COMPREHENSIVE METABOLIC PANEL, NON-FASTING
ALBUMIN: 3.5 g/dL (ref 3.5–5.0)
ALKALINE PHOSPHATASE: 53 U/L (ref 50–130)
ALT (SGPT): 24 U/L — ABNORMAL HIGH (ref 8–22)
ANION GAP: 10 mmol/L (ref 4–13)
AST (SGOT): 21 U/L (ref 8–45)
BILIRUBIN TOTAL: 0.6 mg/dL (ref 0.3–1.3)
BUN/CREA RATIO: 9 (ref 6–22)
BUN: 63 mg/dL — ABNORMAL HIGH (ref 8–25)
CALCIUM: 9.4 mg/dL (ref 8.6–10.2)
CHLORIDE: 107 mmol/L (ref 96–111)
CO2 TOTAL: 20 mmol/L — ABNORMAL LOW (ref 22–30)
CREATININE: 7.38 mg/dL — ABNORMAL HIGH (ref 0.60–1.05)
ESTIMATED GFR - FEMALE: 6 mL/min/BSA — ABNORMAL LOW (ref >=60)
GLUCOSE: 105 mg/dL (ref 65–125)
POTASSIUM: 6.4 mmol/L (ref 3.5–5.1)
PROTEIN TOTAL: 7.3 g/dL (ref 6.4–8.3)
SODIUM: 137 mmol/L (ref 136–145)

## 2023-07-06 LAB — LIGHT GREEN TOP TUBE

## 2023-07-06 LAB — RED TOP TUBE

## 2023-07-06 LAB — PT/INR
INR: 1.03
PROTHROMBIN TIME: 11.8 s (ref 9.4–12.5)

## 2023-07-06 LAB — GOLD TOP TUBE

## 2023-07-06 LAB — TROPONIN-I: TROPONIN-I HS: 30.7 ng/L (ref <=14.0)

## 2023-07-06 MED ORDER — PREDNISONE 50 MG TABLET
50.0000 mg | ORAL_TABLET | Freq: Every day | ORAL | 0 refills | Status: AC
Start: 2023-07-06 — End: 2023-07-11

## 2023-07-06 MED ORDER — SODIUM CHLORIDE 0.9 % (FLUSH) INJECTION SYRINGE
10.0000 mL | INJECTION | Freq: Three times a day (TID) | INTRAMUSCULAR | Status: DC
Start: 2023-07-06 — End: 2023-07-06
  Administered 2023-07-06: 10 mL via INTRAVENOUS

## 2023-07-06 MED ORDER — ALBUTEROL SULFATE HFA 90 MCG/ACTUATION AEROSOL INHALER
1.0000 | INHALATION_SPRAY | Freq: Four times a day (QID) | RESPIRATORY_TRACT | 0 refills | Status: AC | PRN
Start: 2023-07-06 — End: ?

## 2023-07-06 MED ORDER — DEXAMETHASONE SODIUM PHOSPHATE 10 MG/ML INJECTION SOLUTION
10.0000 mg | INTRAMUSCULAR | Status: AC
Start: 2023-07-06 — End: 2023-07-06
  Administered 2023-07-06: 10 mg via INTRAVENOUS
  Filled 2023-07-06: qty 1

## 2023-07-06 MED ORDER — IPRATROPIUM 0.5 MG-ALBUTEROL 3 MG (2.5 MG BASE)/3 ML NEBULIZATION SOLN
3.0000 mL | INHALATION_SOLUTION | RESPIRATORY_TRACT | Status: AC
Start: 2023-07-06 — End: 2023-07-06
  Administered 2023-07-06: 3 mL via RESPIRATORY_TRACT
  Filled 2023-07-06: qty 3

## 2023-07-06 NOTE — Progress Notes (Signed)
Patient called to report wheezing, chest tightness, chest pain and trouble breathing. I advised she needs to go to the ED. Her husband can drive her and will take her now.     Chinita Greenland MD   On call physician

## 2023-07-06 NOTE — ED Nurses Note (Signed)
Patient discharged home with family.  AVS reviewed with patient/care giver.  A written copy of the AVS and discharge instructions was given to the patient/care giver.  Questions sufficiently answered as needed.  Patient/care giver encouraged to follow up with PCP as indicated.  In the event of an emergency, patient/care giver instructed to call 911 or go to the nearest emergency room.        Current Discharge Medication List        START taking these medications.        Details   predniSONE 50 mg Tablet  Commonly known as: DELTASONE   Take 1 Tablet (50 mg total) by mouth Once a day for 5 days  Qty: 5 Tablet  Refills: 0            CONTINUE these medications which have CHANGED during your visit.        Details   albuterol sulfate 90 mcg/actuation oral inhaler  Commonly known as: PROVENTIL or VENTOLIN or PROAIR  What changed:   how much to take  when to take this   Take 1-2 Puffs by inhalation Every 6 hours as needed  Qty: 1 Each  Refills: 0            CONTINUE these medications - NO CHANGES were made during your visit.        Details   ALPRAZolam 0.25 mg Tablet  Commonly known as: XANAX   Take 1 Tablet (0.25 mg total) by mouth Once per day as needed for Anxiety  Refills: 0     atorvastatin 40 mg Tablet  Commonly known as: LIPITOR   Take 1 Tablet (40 mg total) by mouth Every evening  Qty: 90 Tablet  Refills: 3     carvediloL 3.125 mg Tablet  Commonly known as: COREG   Take 1 Tablet (3.125 mg total) by mouth Twice daily with food  Qty: 90 Tablet  Refills: 3     cyclobenzaprine 5 mg Tablet  Commonly known as: FLEXERIL   Take 1 Tablet (5 mg total) by mouth Three times a day as needed for Muscle spasms  Refills: 0     escitalopram oxalate 10 mg Tablet  Commonly known as: LEXAPRO   Take 1 Tablet (10 mg total) by mouth Every night  Refills: 0     Fish OiL 1,000 (120-180) mg Capsule  Generic drug: omega-3-DHA-EPA-fish oil   Take by mouth  Refills: 0     Fish Oil-Omega-3 Fatty Acids 360-1,200 mg Capsule   Take 2 Capsules by  mouth Twice daily  Refills: 0     fluticasone propionate 50 mcg/actuation Spray, Suspension  Commonly known as: FLONASE   Administer 1 Spray into each nostril Once per day as needed (Allergy symptoms (nasal congestion/ runny nose))  Refills: 0     iron sucrose 50 mg iron/2.5 mL Solution  Commonly known as: VENOFER   2.5 mL (50 mg total)  Refills: 0     loperamide 2 mg Capsule  Commonly known as: IMODIUM   Take 2 Capsules (4 mg total) by mouth Every 6 hours as needed (Diarrhea)  Refills: 0     loratadine 10 mg Tablet  Commonly known as: CLARITIN   Take 1 Tablet (10 mg total) by mouth Once per day as needed (Allergies)  Refills: 0     losartan 25 mg Tablet  Commonly known as: COZAAR   Take 1 Tablet (25 mg total) by mouth Once a day for 30 days  Qty: 30 Tablet  Refills: 0     Mounjaro 7.5 mg/0.5 mL Pen Injector  Generic drug: tirzepatide   Inject 0.5 mL (7.5 mg total) under the skin Every 7 days  Refills: 0     nitroGLYCERIN 0.4 mg Tablet, Sublingual  Commonly known as: NITROSTAT   Place 1 Tablet (0.4 mg total) under the tongue Every 5 minutes as needed for Chest pain for up to 10 doses for 3 doses over 15 minutes  Qty: 10 Tablet  Refills: 0     promethazine-dextromethorphan 6.25-15 mg/5 mL Syrup  Commonly known as: PHENERGAN-DM   TAKE 5 ML BY MOUTH 4 TIMES DAILY AS NEEDED FOR COUGH  Refills: 0     sevelamer carbonate 800 mg Tablet  Commonly known as: RENVELA   Take 2 Tablets (1,600 mg total) by mouth  Refills: 0     ticagrelor 90 mg Tablet  Commonly known as: BRILINTA   Take 1 Tablet (90 mg total) by mouth Twice daily  Qty: 180 Tablet  Refills: 3     traMADoL 50 mg Tablet  Commonly known as: ULTRAM   Take 1 Tablet (50 mg total) by mouth Every 6 hours as needed for Pain  Qty: 30 Tablet  Refills: 0     Triphrocaps 1 mg Capsule  Generic drug: B complex-vitamin C-folic acid   Take 1 Capsule by mouth Every night  Refills: 0     vitamin E 100 unit Capsule   Take 1 Capsule (100 Units total) by mouth Twice daily  Refills: 0

## 2023-07-06 NOTE — ED Nurses Note (Signed)
Respiratory at bedside.

## 2023-07-06 NOTE — ED Nurses Note (Signed)
Respiratory called for pts breathing treatment.

## 2023-07-06 NOTE — ED Provider Notes (Signed)
Domingo Madeira, MD  [facility] - Emergency Department  Emergency Department Visit Note    Chief Complaint: Chest Pain    HISTORY OF PRESENT ILLNESS     [name], date of birth [birth year], is a 57 y.o.female who presents to the Emergency Department on 07/06/2023 with chest pain.  Patient states she has been feeling generally ill for the past week and was started on Amoxicillin by her her primary care provider for sinus infection. 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 Patient states she woke up this morning around 0100 due to non-productive cough and wheezing along with associated diffuse chest tightness. Patient has an upcoming cardiology appointment with [facility] this month as well. Denies any nausea, vomiting, shortness of breath, chills, or measured fevers at this time.     REVIEW OF SYSTEMS     The pertinent positive and negative symptoms are as per HPI. All other systems reviewed and are negative.     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    Constipation     COVID 07/2019    Depression     Diabetes mellitus (CMS HCC)     Diabetes mellitus, type 2 (CMS HCC)     Diarrhea     Dyspnea on exertion     ESRD (end stage renal disease) (CMS HCC)     Essential hypertension     Exercise intolerance     Fatty liver     Fluid overload 08/21/21    Recent hospitalization for fluid overload, HD initiated 10/18/20     Headache(784.0)     Hemodialysis patient (CMS HCC)     HD Monday-Wednesday-Friday    History of kidney disease     CKD-Stage 5-HD initiated 10/18/20    History of nephrectomy 03/24/2020    L nephrectomy    Hx of echocardiogram 10/18/2020    Technically difficult d/t pt. body habitus; Left vent. EF est. 55-60%; mildy calcified aortic vlave leaflets-especially right coroanry cusp; Small mobile density on aortic valve-no aortic valve stenosis or regurg; Concentric remodeling; mild MR; Trace AR; Trileaflet aortic vlave; Nl IVC size wiht <50% insp. collapse;       Hyperlipidemia     Hyperparathyroidism (CMS HCC)     Hyperthyroidism     Old records states hyperthyroidism, patient denies, states has hyperparathyroidism    Irritable bowel syndrome     Macular edema     Morbid obesity (CMS HCC)     Obesity     Panic attack     Peripheral edema     ankles/feet    Peripheral neuropathy     bilateral feet    Pneumonia 07/2019; 09/2020    COVID Pneumonia 07/2019; fluid overload/pneumonia with recent  hosp. 09/2020    Shortness of breath     SOB after climbing 8 steps     Type 2 diabetes mellitus (CMS HCC)     Wears glasses     reading    White coat hypertension      Past Surgical History:  Past Surgical History:   Procedure Laterality Date    Av fistula placement Left 08/11/2020    Ectopic pregnancy surgery  05/24/1998    Hx cataract removal Bilateral 2016    Hx cholecystectomy  09/10/96    Hx hand surgery Right 2017    Hx other Right 10/18/2020    Hx pelvic laparoscopy  05/24/98    Hx shoulder surgery Left 2017    Hx tubal ligation  09/10/2012    Hymenectomy  1996    Kidney surgery  10/24/17; 03/24/20    Laparoscopic nephrectomy, hand assisted Left 03/24/2020    Laparoscopic partial nephrectomy Left 10/24/2017    Renal biopsy, percutaneous  03/08/2020     Family History:  Family Medical History:       Problem Relation (Age of Onset)    Breast Cancer Paternal Aunt    Cancer Maternal Grandmother    Diabetes Father, Paternal Aunt     High Cholesterol Maternal Uncle, Mother    Hypertension (High Blood Pressure) Maternal Uncle, Father          Social History:  Social History     Tobacco Use    Smoking status: Never    Smokeless tobacco: Never   Vaping Use    Vaping status: Never Used   Substance Use Topics    Alcohol use: No    Drug use: No     Medications:  Current Outpatient Medications   Medication Sig    albuterol sulfate (PROVENTIL OR VENTOLIN OR PROAIR) 90 mcg/actuation Inhalation oral inhaler Take 1-2 Puffs by inhalation Every 6 hours as needed    ALPRAZolam (XANAX) 0.25 mg Oral Tablet Take 1 Tablet (0.25 mg total) by mouth Once per day as needed for Anxiety    atorvastatin (LIPITOR) 40 mg Oral Tablet Take 1 Tablet (40 mg total) by mouth Every evening    carvediloL (COREG) 3.125 mg Oral Tablet Take 1 Tablet (3.125 mg total) by mouth Twice daily with food    cyclobenzaprine (FLEXERIL) 5 mg Oral Tablet Take 1 Tablet (5 mg total) by mouth Three times a day as needed for Muscle spasms    escitalopram oxalate (LEXAPRO) 10 mg Oral Tablet Take 1 Tablet (10 mg total) by mouth Every night    Fish Oil-Omega-3 Fatty Acids 360-1,200 mg Oral Capsule Take 2 Capsules by mouth Twice daily    fluticasone (FLONASE) 50 mcg/actuation Nasal Spray, Suspension Administer 1 Spray into each nostril Once per day as needed (Allergy symptoms (nasal congestion/ runny nose))    iron sucrose (VENOFER) 50 mg iron/2.5 mL Intravenous Solution 2.5 mL (50 mg total)    loperamide (IMODIUM) 2 mg Oral Capsule Take 2 Capsules (4 mg total) by mouth Every 6 hours as needed (Diarrhea)    loratadine (CLARITIN) 10 mg Oral Tablet Take 1 Tablet (10 mg total) by mouth Once per day as needed (Allergies)    losartan (COZAAR) 25 mg Oral Tablet Take 1 Tablet (25 mg total) by mouth Once a day for 30 days (Patient taking differently: Take 1 Tablet (25 mg total) by mouth Take in the evening on non-dialysis days (Sun/Tues/Thurs/Sat); does not take on dialysis days)    MOUNJARO 7.5 mg/0.5  mL  Subcutaneous Pen Injector Inject 0.5 mL (7.5 mg total) under the skin Every 7 days    nitroGLYCERIN (NITROSTAT) 0.4 mg Sublingual Tablet, Sublingual Place 1 Tablet (0.4 mg total) under the tongue Every 5 minutes as needed for Chest pain for up to 10 doses for 3 doses over 15 minutes    omega-3-DHA-EPA-fish oil (FISH OIL) 1,000 mg (120 mg-180 mg) Oral Capsule Take by mouth    predniSONE (DELTASONE) 50 mg Oral Tablet Take 1 Tablet (50 mg total) by mouth Once a day for 5 days    promethazine-dextromethorphan (PHENERGAN-DM) 6.25-15 mg/5 mL Oral Syrup TAKE 5 ML BY MOUTH 4 TIMES DAILY AS NEEDED FOR COUGH    sevelamer carbonate (RENVELA) 800 mg Oral Tablet Take 2 Tablets (1,600 mg total) by mouth    ticagrelor (BRILINTA) 90 mg Oral Tablet Take 1 Tablet (90 mg total) by mouth Twice daily    traMADoL (ULTRAM) 50 mg Oral Tablet Take 1 Tablet (50 mg total) by mouth Every 6 hours as needed for Pain    TRIPHROCAPS 1 mg Oral Capsule Take 1 Capsule by mouth Every night    vitamin E 100 unit Oral Capsule Take 1 Capsule (100 Units total) by mouth Twice daily     Allergies:  Allergies   Allergen Reactions    Gabapentin Itching    Hydrocodone     Ibuprofen     Vicodin [Hydrocodone-Acetaminophen] Itching     PHYSICAL EXAM     Vitals:  ED Triage Vitals [07/06/23 0508]   BP (Non-Invasive) (!) 179/85   Heart Rate (!) 103   Respiratory Rate 20   Temperature 37 C (98.6 F)   SpO2 94 %   Weight 113 kg (248 lb 12.8 oz)   Height 1.702 m (5\' 7" )     Constitutional: The patient is alert and oriented to person, place, and time. Well-developed and well-nourished.  HENT: Atraumatic, normocephalic head.  Eyes: Pupils equal and round, reactive to light. No scleral icterus. Normal conjunctiva. Extraocular movements are intact.  Neck: Supple, non-tender, no nuchal rigidity, no adenopathy.   Lungs: Decreased breath sounds at base with faint polyphonic wheezing. No respiratory distress or retractions.  Cardiovascular: Heart is S1-S2 regular rate and  regular rhythm without murmur click or rub.  Abdomen:  Soft, non-distended. No tenderness to palpation without evidence of rebound or guarding. No pulsatile masses. No organomegaly.   Extremities: Full range of motion, no clubbing, cyanosis, or edema. Pulses 2+, capillary refill <2 seconds.  Skin: Warm and dry. No cyanosis, jaundice, rash or lesion.  Neurologic: Alert and oriented x3. Normal facial symmetry and speech, Normal upper and lower extremity strength, and grossly normal sensation.     DIFFERENTIAL DIAGNOSIS     ESRD  CHF  Pneumonia  Failure of Outpatient Management      DIAGNOSTIC STUDIES     Labs:    Results for orders placed or performed during the hospital encounter of 07/06/23   COMPREHENSIVE METABOLIC PANEL, NON-FASTING   Result Value Ref Range    SODIUM 137 136 - 145 mmol/L    POTASSIUM 6.4 (HH) 3.5 - 5.1 mmol/L    CHLORIDE 107 96 - 111 mmol/L    CO2 TOTAL 20 (L) 22 - 30 mmol/L    ANION GAP 10 4 - 13 mmol/L    BUN 63 (H) 8 - 25 mg/dL    CREATININE 1.61 (H) 0.60 - 1.05 mg/dL    BUN/CREA RATIO 9 6 - 22  ALBUMIN 3.5 3.5 - 5.0 g/dL     CALCIUM 9.4 8.6 - 32.2 mg/dL    GLUCOSE 025 65 - 427 mg/dL    ALKALINE PHOSPHATASE 53 50 - 130 U/L    ALT (SGPT) 24 (H) 8 - 22 U/L    AST (SGOT)  21 8 - 45 U/L    BILIRUBIN TOTAL 0.6 0.3 - 1.3 mg/dL    PROTEIN TOTAL 7.3 6.4 - 8.3 g/dL    ESTIMATED GFR - FEMALE 6 (L) >=60 mL/min/BSA   TROPONIN-I   Result Value Ref Range    TROPONIN-I HS 30.7 (HH) <=14.0 ng/L ng/L   PT/INR   Result Value Ref Range    PROTHROMBIN TIME 11.8 9.4 - 12.5 seconds    INR 1.03    COVID-19, FLU A/B, RSV RAPID BY PCR   Result Value Ref Range    SARS-CoV-2 Not Detected Not Detected    INFLUENZA VIRUS TYPE A Not Detected Not Detected    INFLUENZA VIRUS TYPE B Not Detected Not Detected    RESPIRATORY SYNCTIAL VIRUS (RSV) Not Detected Not Detected   CBC WITH DIFF   Result Value Ref Range    WBC 10.6 3.7 - 11.0 x10^3/uL    RBC 3.30 (L) 3.85 - 5.22 x10^6/uL    HGB 11.3 (L) 11.5 - 16.0 g/dL    HCT 06.2 (L)  37.6 - 46.0 %    MCV 104.2 (H) 78.0 - 100.0 fL    MCH 34.2 (H) 26.0 - 32.0 pg    MCHC 32.8 31.0 - 35.5 g/dL    RDW-CV 28.3 15.1 - 76.1 %    PLATELETS 212 150 - 400 x10^3/uL    MPV 10.3 8.7 - 12.5 fL    NEUTROPHIL % 83.8 %    LYMPHOCYTE % 9.9 %    MONOCYTE % 4.8 %    EOSINOPHIL % 0.9 %    BASOPHIL % 0.2 %    NEUTROPHIL # 8.85 (H) 1.50 - 7.70 x10^3/uL    LYMPHOCYTE # 1.05 1.00 - 4.80 x10^3/uL    MONOCYTE # 0.51 0.20 - 1.10 x10^3/uL    EOSINOPHIL # <0.10 <=0.50 x10^3/uL    BASOPHIL # <0.10 <=0.20 x10^3/uL    IMMATURE GRANULOCYTE % 0.4 0.0 - 1.0 %    IMMATURE GRANULOCYTE # <0.10 <0.10 x10^3/uL     Labs reviewed and interpreted by me.    Radiology:    XR AP MOBILE CHEST (If patient condition warrants)   Final Result      Interstitial pattern is age indeterminate, correlation with reactive or atypical/viral process. Possible confluent right infrahilar density.         Radiologist location ID: [id number]           Radiological imaging interpreted by radiologist. Report reviewed by me.    EKG:  12 lead EKG interpreted by me shows sinus tach rhythm, rate of 102 bpm, normal axis, normal intervals, otherwise normal EKG.    ED PROGRESS NOTE / MEDICAL DECISION MAKING     Old records reviewed by me:  I have reviewed the nurse's notes. I have reviewed the patient's problem list and pertinent past medical records.    Orders Placed This Encounter    XR AP MOBILE CHEST (If patient condition warrants)    COMPREHENSIVE METABOLIC PANEL, NON-FASTING    TROPONIN-I    PT/INR    RAINBOW DRAW - [facility] ONLY    COVID-19, FLU A/B, RSV RAPID BY PCR    CBC WITH DIFF  ECG 12-LEAD (Take to provider with a brief history)    INSERT & MAINTAIN PERIPHERAL IV ACCESS    NS flush syringe    ipratropium-albuterol 0.5 mg-3 mg(2.5 mg base)/3 mL Solution for Nebulization    dexAMETHasone 10 mg/mL injection    predniSONE (DELTASONE) 50 mg Oral Tablet    albuterol sulfate (PROVENTIL OR VENTOLIN OR PROAIR) 90 mcg/actuation Inhalation oral inhaler             Interpretation:    Medical Decision Making  Amount and/or Complexity of Data Reviewed  Labs: ordered.     Details: CBC shows:  RBC 3.30 (L)  HGB 11.3 (L)  HCT 34.4 (L)  MCV 104.2 (H)  MCH 34.2 (H)  CMP shows:  POTASSIUM 6.4 (HH)  CO2 TOTAL 20 (L)  BUN 63 (H)  CREATININE 7.38 (H)  Trop high at 30.7  ALT 24 (H)  Radiology: ordered.     Details: CXR imaging shows interstitial pattern is age indeterminate, correlation with reactive or atypical/viral process. Possible confluent right infrahilar density.  ECG/medicine tests: ordered and independent interpretation performed.    Risk  Prescription drug management.       Summary:     57 year old female presents with chest pain. Patient states she has been feeling generally ill for the past week and was started on Amoxicillin by her her primary care provider for sinus infection. 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 Patient states she woke up this morning around 0100 due to non-productive cough and wheezing along with associated diffuse chest tightness. Patient has an upcoming cardiology appointment with [facility] this month as well. Denies any nausea, vomiting, shortness of breath, chills, or measured fevers at this time.     Work-Up:    0605: Initial evaluation is complete at this time. I discussed with the patient that I would order CXR, CBC, CMP, EKG, troponin, PTT, and PT/INR to further evaluate. Will order duo neb and dex to treat. Patient is agreeable with the treatment plan at this time.     Final Decision Making:     0700: On recheck, the patient is in no acute distress. I explained the results of the diagnostic studies. Patient will be discharged with prednisone and albuterol inhaler. Patient is to follow up with Sallye Lat, NP for primary medical care as needed. I discussed the diagnosis, disposition, and follow-up plan. The patient understood and is in accordance with the treatment plan at this time.  Patient is to return here to the Emergency Department if new or worsening symptoms appear. All of their questions have been answered to their satisfaction. The patient is in stable condition at the time of discharge.     OPIATE PRESCRIPTION       Not applicable    CORE MEASURES      Not applicable    CRITICAL CARE TIME      Not applicable     PRE-DISPOSITION VITALS      Pre-Disposition Vitals:  Filed Vitals:    07/06/23 0600 07/06/23 0615 07/06/23 0630 07/06/23 0645   BP: (!) 196/104  (!) 197/102    Pulse: 99 99 95 96   Resp: 15 12 (!) 22 15   Temp:       SpO2: 97% 96% 96% 96%       CLINICAL IMPRESSION     Clinical Impression   Chest pain, unspecified type (Primary)   SOB (shortness of breath)  ESRD on dialysis (CMS HCC)   Hyperkalemia       DISPOSITION/PLAN     Discharged    Prescriptions:     New Prescriptions    ALBUTEROL SULFATE (PROVENTIL OR VENTOLIN OR PROAIR) 90 MCG/ACTUATION INHALATION ORAL INHALER    Take 1-2 Puffs by inhalation Every 6 hours as needed    PREDNISONE (DELTASONE) 50 MG ORAL TABLET    Take 1 Tablet (50 mg total) by mouth Once a day for 5 days        Follow-Up:     Sallye Lat, NP  [id number]  [address] [place] [address]  [phone]    Call in 2 days  for follow up      Condition at Disposition: Stable        SCRIBE ATTESTATION STATEMENT  I Burtis Junes, SCRIBE scribed for Kara Pacer, MD.     Documentation assistance provided for Kara Pacer, MD by Burtis Junes, SCRIBE. Information recorded by the scribe was done at my direction and has been reviewed and validated by me Kara Pacer, MD.

## 2023-07-06 NOTE — ED Triage Notes (Signed)
Sunday pt began feeling ill, went to PCP Monday and started on Amoxicillin for sinus infection. Since 0100 pt has been awake, coughing and wheezing along with feeling "tight across my chest and making my heart hurt'. Hx MI on 04/21/23 as well as a "narrowing heart valve". Pt states she has an appt next week with Romeo Apple for cardiology. Pt is LUE limb alert r/t dialysis fistula. (M-W-F). Pt last had dialysis on Monday as she had a surgery Wed on her LUE dialysis fistula and didn't get dialyzed. Pt states that she does still make urine. SOB noted with long sentences.

## 2023-07-11 ENCOUNTER — Other Ambulatory Visit: Payer: Self-pay

## 2023-07-11 ENCOUNTER — Emergency Department
Admission: EM | Admit: 2023-07-11 | Discharge: 2023-07-11 | Disposition: A | Discharge status: Home / Self Care | Payer: 59 | Attending: Emergency Medicine | Admitting: Emergency Medicine

## 2023-07-11 DIAGNOSIS — I2510 Atherosclerotic heart disease of native coronary artery without angina pectoris: Secondary | ICD-10-CM | POA: Insufficient documentation

## 2023-07-11 DIAGNOSIS — R7989 Other specified abnormal findings of blood chemistry: Secondary | ICD-10-CM | POA: Insufficient documentation

## 2023-07-11 DIAGNOSIS — I120 Hypertensive chronic kidney disease with stage 5 chronic kidney disease or end stage renal disease: Secondary | ICD-10-CM | POA: Insufficient documentation

## 2023-07-11 DIAGNOSIS — Z7985 Long-term (current) use of injectable non-insulin antidiabetic drugs: Secondary | ICD-10-CM | POA: Insufficient documentation

## 2023-07-11 DIAGNOSIS — N186 End stage renal disease: Secondary | ICD-10-CM | POA: Insufficient documentation

## 2023-07-11 DIAGNOSIS — Z1152 Encounter for screening for COVID-19: Secondary | ICD-10-CM | POA: Insufficient documentation

## 2023-07-11 DIAGNOSIS — M791 Myalgia, unspecified site: Secondary | ICD-10-CM | POA: Insufficient documentation

## 2023-07-11 DIAGNOSIS — Z992 Dependence on renal dialysis: Secondary | ICD-10-CM | POA: Insufficient documentation

## 2023-07-11 DIAGNOSIS — R52 Pain, unspecified: Secondary | ICD-10-CM

## 2023-07-11 DIAGNOSIS — E1122 Type 2 diabetes mellitus with diabetic chronic kidney disease: Secondary | ICD-10-CM | POA: Insufficient documentation

## 2023-07-11 DIAGNOSIS — Z7902 Long term (current) use of antithrombotics/antiplatelets: Secondary | ICD-10-CM | POA: Insufficient documentation

## 2023-07-11 LAB — URINALYSIS WITH MICROSCOPIC REFLEX IF INDICATED BMC/JMC ONLY
BILIRUBIN: NEGATIVE mg/dL
BLOOD: NEGATIVE mg/dL
GLUCOSE: NEGATIVE mg/dL
KETONES: NEGATIVE mg/dL
NITRITE: NEGATIVE
PH: 8 — ABNORMAL HIGH (ref <8.0)
PROTEIN: 30 mg/dL — AB
SPECIFIC GRAVITY: 1.01 (ref <1.022)
UROBILINOGEN: 0.2 mg/dL (ref <=2.0)

## 2023-07-11 LAB — CBC WITH DIFF
BASOPHIL #: <0.1 10*3/uL (ref <=0.20)
BASOPHIL %: 0.2 %
EOSINOPHIL #: 0.13 10*3/uL (ref <=0.50)
EOSINOPHIL %: 1.5 %
HCT: 33.4 % — ABNORMAL LOW (ref 34.8–46.0)
HGB: 11 g/dL — ABNORMAL LOW (ref 11.5–16.0)
IMMATURE GRANULOCYTE #: <0.1 10*3/uL (ref <0.10)
IMMATURE GRANULOCYTE %: 0.5 % (ref 0.0–1.0)
LYMPHOCYTE #: 1.47 10*3/uL (ref 1.00–4.80)
LYMPHOCYTE %: 16.8 %
MCH: 34.6 pg — ABNORMAL HIGH (ref 26.0–32.0)
MCHC: 32.9 g/dL (ref 31.0–35.5)
MCV: 105 fL — ABNORMAL HIGH (ref 78.0–100.0)
MONOCYTE #: 0.42 10*3/uL (ref 0.20–1.10)
MONOCYTE %: 4.8 %
MPV: 9.9 fL (ref 8.7–12.5)
NEUTROPHIL #: 6.67 10*3/uL (ref 1.50–7.70)
NEUTROPHIL %: 76.2 %
PLATELETS: 288 10*3/uL (ref 150–400)
RBC: 3.18 10*6/uL — ABNORMAL LOW (ref 3.85–5.22)
RDW-CV: 15.2 % (ref 11.5–15.5)
WBC: 8.8 10*3/uL (ref 3.7–11.0)

## 2023-07-11 LAB — EXTENDED RESPIRATORY VIRUS PANEL

## 2023-07-11 LAB — COMPREHENSIVE METABOLIC PANEL, NON-FASTING
ALBUMIN: 3.2 g/dL — ABNORMAL LOW (ref 3.5–5.0)
ALKALINE PHOSPHATASE: 52 U/L (ref 50–130)
ALT (SGPT): 19 U/L (ref 8–22)
ANION GAP: 14 mmol/L — ABNORMAL HIGH (ref 4–13)
AST (SGOT): 17 U/L (ref 8–45)
BILIRUBIN TOTAL: 0.5 mg/dL (ref 0.3–1.3)
BUN/CREA RATIO: 9 (ref 6–22)
BUN: 63 mg/dL — ABNORMAL HIGH (ref 8–25)
CALCIUM: 8 mg/dL — ABNORMAL LOW (ref 8.6–10.2)
CHLORIDE: 102 mmol/L (ref 96–111)
CO2 TOTAL: 26 mmol/L (ref 22–30)
CREATININE: 7.18 mg/dL — ABNORMAL HIGH (ref 0.60–1.05)
ESTIMATED GFR - FEMALE: 6 mL/min/BSA — ABNORMAL LOW (ref >=60)
GLUCOSE: 95 mg/dL (ref 65–125)
POTASSIUM: 4.8 mmol/L (ref 3.5–5.1)
PROTEIN TOTAL: 6.5 g/dL (ref 6.4–8.3)
SODIUM: 142 mmol/L (ref 136–145)

## 2023-07-11 LAB — PT/INR
INR: 0.98
PROTHROMBIN TIME: 11.3 s (ref 9.4–12.5)

## 2023-07-11 LAB — MAGNESIUM: MAGNESIUM: 2.5 mg/dL (ref 1.8–2.6)

## 2023-07-11 LAB — URINALYSIS, MICROSCOPIC

## 2023-07-11 LAB — LACTIC ACID LEVEL W/ REFLEX FOR LEVEL >2.0: LACTIC ACID: 0.9 mmol/L (ref 0.5–2.2)

## 2023-07-11 LAB — LIPASE: LIPASE: 24 U/L (ref 10–60)

## 2023-07-11 LAB — PHOSPHORUS: PHOSPHORUS: 7.5 mg/dL — ABNORMAL HIGH (ref 2.4–4.7)

## 2023-07-11 LAB — TROPONIN-I: TROPONIN-I HS: 17.7 ng/L (ref <=14.0)

## 2023-07-11 LAB — CREATINE KINASE (CK), TOTAL, SERUM OR PLASMA: CREATINE KINASE: 36 U/L (ref 25–190)

## 2023-07-11 LAB — PTT (PARTIAL THROMBOPLASTIN TIME): APTT: 27.5 s (ref 24.0–36.5)

## 2023-07-11 NOTE — ED Provider Notes (Signed)
[name] Rounds, DO  [facility] - Emergency Department  ED Attending Note  Name: [name]  DOB:  05-16-1966   Date of Service: 07/11/2023  Room: ACA01/ACA01  Primary Care Doctor:Brenda Reece Packer, NP  Patient information was obtained from patient.  History/Exam limitations: none.    Chief Complaint   Patient presents with    Generalized Body Aches           HISTORY OF PRESENT ILLNESS     [name], date of birth [birth year], is a 57 y.o.female who presents to the Emergency Department with generalized body aches.  Patient states that yesterday evening she started noticing body aches and fatigue.  She states it hurts all over her whole body even when somebody touches her.  She states that she was at work this morning and when coworkers hug to her goodbye that caused her pain all over her body.  She states that she has been noticing more bruising on her body since she was started on antiplatelet therapy for coronary artery disease.  She states that she has a Monday Wednesday Friday dialysis patient under the care of Dr. Webb Silversmith.  She states that she is due for dialysis later this afternoon at 1:00 p.m.Marland Kitchen  She denies any fevers, headaches, chest pain, shortness of breath, cough or congestion.  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 body habitus; Left vent. EF est. 55-60%; mildy calcified aortic vlave leaflets-especially right coroanry cusp; Small mobile density  on aortic valve-no aortic valve stenosis or regurg; Concentric remodeling; mild MR; Trace AR; Trileaflet aortic vlave; Nl IVC size wiht <50% insp. collapse;       Hyperlipidemia     Hyperparathyroidism (CMS HCC)     Hyperthyroidism     Old records states hyperthyroidism, patient denies, states has hyperparathyroidism    Irritable bowel syndrome     Macular edema     Morbid obesity (CMS HCC)     Obesity     Panic attack     Peripheral edema     ankles/feet    Peripheral neuropathy     bilateral feet    Pneumonia 07/2019; 09/2020    COVID Pneumonia 07/2019; fluid overload/pneumonia with recent hosp. 09/2020    Shortness of breath     SOB after climbing 8 steps     Type 2 diabetes mellitus (CMS HCC)     Wears glasses     reading    White coat hypertension        Past Surgical History:  Past Surgical History:   Procedure Laterality Date    Av fistula placement Left 08/11/2020    Ectopic pregnancy surgery  05/24/1998    Hx cataract removal Bilateral 2016    Hx cholecystectomy  09/10/96    Hx hand surgery Right 2017    Hx other Right 10/18/2020    Hx pelvic laparoscopy  05/24/98    Hx shoulder surgery Left 2017    Hx tubal ligation  09/10/2012     Hymenectomy  1996    Kidney surgery  10/24/17; 03/24/20    Laparoscopic nephrectomy, hand assisted Left 03/24/2020    Laparoscopic partial nephrectomy Left 10/24/2017    Renal biopsy, percutaneous  03/08/2020       Family History:  Family Medical History:       Problem Relation (Age of Onset)    Breast Cancer Paternal Aunt    Cancer Maternal Grandmother    Diabetes Father, Paternal Aunt    High Cholesterol Maternal Uncle, Mother    Hypertension (High Blood Pressure) Maternal Uncle, Father              Social History:  Social History     Tobacco Use    Smoking status: Never    Smokeless tobacco: Never   Vaping Use    Vaping status: Never Used   Substance Use Topics    Alcohol use: No    Drug use: No       Medications:  Current Outpatient Medications   Medication Sig    albuterol sulfate (PROVENTIL OR VENTOLIN OR PROAIR) 90 mcg/actuation Inhalation oral inhaler Take 1-2 Puffs by inhalation Every 6 hours as needed    ALPRAZolam (XANAX) 0.25 mg Oral Tablet Take 1 Tablet (0.25 mg total) by mouth Once per day as needed for Anxiety    atorvastatin (LIPITOR) 40 mg Oral Tablet Take 1 Tablet (40 mg total) by mouth Every evening    carvediloL (COREG) 3.125 mg Oral Tablet Take 1 Tablet (3.125 mg total) by mouth Twice daily with food    cyclobenzaprine (FLEXERIL) 5 mg Oral Tablet Take 1 Tablet (5 mg total) by mouth Three times a day as needed for Muscle spasms    escitalopram oxalate (LEXAPRO) 10 mg Oral Tablet Take 1 Tablet (10 mg total) by mouth Every night    Fish Oil-Omega-3 Fatty Acids 360-1,200 mg Oral Capsule Take 2 Capsules by mouth Twice daily    fluticasone (FLONASE) 50 mcg/actuation Nasal Spray,  Suspension Administer 1 Spray into each nostril Once per day as needed (Allergy symptoms (nasal congestion/ runny nose))    iron sucrose (VENOFER) 50 mg iron/2.5 mL Intravenous Solution 2.5 mL (50 mg total)    loperamide (IMODIUM) 2 mg Oral Capsule Take 2 Capsules (4 mg total) by mouth Every 6 hours as needed (Diarrhea)     loratadine (CLARITIN) 10 mg Oral Tablet Take 1 Tablet (10 mg total) by mouth Once per day as needed (Allergies)    losartan (COZAAR) 25 mg Oral Tablet Take 1 Tablet (25 mg total) by mouth Once a day for 30 days (Patient taking differently: Take 1 Tablet (25 mg total) by mouth Take in the evening on non-dialysis days (Sun/Tues/Thurs/Sat); does not take on dialysis days)    MOUNJARO 7.5 mg/0.5 mL Subcutaneous Pen Injector Inject 0.5 mL (7.5 mg total) under the skin Every 7 days    nitroGLYCERIN (NITROSTAT) 0.4 mg Sublingual Tablet, Sublingual Place 1 Tablet (0.4 mg total) under the tongue Every 5 minutes as needed for Chest pain for up to 10 doses for 3 doses over 15 minutes    omega-3-DHA-EPA-fish oil (FISH OIL) 1,000 mg (120 mg-180 mg) Oral Capsule Take by mouth    predniSONE (DELTASONE) 50 mg Oral Tablet Take 1 Tablet (50 mg total) by mouth Once a day for 5 days    promethazine-dextromethorphan (PHENERGAN-DM) 6.25-15 mg/5 mL Oral Syrup TAKE 5 ML BY MOUTH 4 TIMES DAILY AS NEEDED FOR COUGH    sevelamer carbonate (RENVELA) 800 mg Oral Tablet Take 2 Tablets (1,600 mg total) by mouth    ticagrelor (BRILINTA) 90 mg Oral Tablet Take 1 Tablet (90 mg total) by mouth Twice daily    traMADoL (ULTRAM) 50 mg Oral Tablet Take 1 Tablet (50 mg total) by mouth Every 6 hours as needed for Pain    TRIPHROCAPS 1 mg Oral Capsule Take 1 Capsule by mouth Every night    vitamin E 100 unit Oral Capsule Take 1 Capsule (100 Units total) by mouth Twice daily       Nursing no longer updates the medication list during triage, therefore the system information above may not be accurate.     Allergies:  Allergies   Allergen Reactions    Gabapentin Itching    Hydrocodone     Ibuprofen     Vicodin [Hydrocodone-Acetaminophen] Itching                                                      PHYSICAL EXAM     ED Triage Vitals [07/11/23 0956]   BP (Non-Invasive) (!) 170/68   Heart Rate 89   Respiratory Rate 20   Temperature 36.6 C (97.9 F)   SpO2 100 %    Weight 109 kg (240 lb)   Height 1.702 m (5\' 7" )     Body mass index is 37.59 kg/m.    General: Awake. Alert. no acute distress.  Head:  Atraumatic     Eyes: Anicteric sclera, noninjected conjunctiva.  PERRL. EOMI.  ENT:  Oropharynx patent, pink, moist, no erythema, exudates or asymmetry. No stridor.   Neck: Neck is supple, nontender, no adenopathy. No nuchal rigidity.   Lungs: Clear to auscultation bilaterally. No wheezes, rales or rhonchi.  no respiratory distress.  Cardiovascular:  Heart is regular without murmurs rubs or gallops  Abdomen:  Soft, nontender, non-distended, normoactive bowel sounds. No peritoneal signs. No pulsatile mass.  Extremities:  No cyanosis, edema, tenderness or asymmetry.  There is a dialysis access graft in her left upper extremity, there is a strong palpable thrill , no bleeding  Skin: Skin warm, dry and well-perfused. No petechia or erythema.  Several scattered small bruises noted on the bilateral upper extremities  Back:  Non-tender to palpation in the midline.    Neurologic: Awake, alert, no lethargy, somnolence or confusion. Moves upper and lower extremities without focal deficits.   Psychiatric: Answers questions appropriately.                                                      DIAGNOSTIC STUDIES     Labs:    Results for orders placed or performed during the hospital encounter of 07/11/23   EXTENDED RESPIRATORY VIRUS PANEL    Specimen: Nasopharyngeal Swab   Result Value Ref Range    ADENOVIRUS ARRAY Not Detected Not Detected    CORONAVIRUS 229E Not Detected Not Detected    CORONAVIRUS HKU1 Not Detected Not Detected    CORONAVIRUS NL63 Not Detected Not Detected    CORONAVIRUS OC43 Not Detected Not Detected    SARS CORONAVIRUS 2 (SARS-CoV-2) Not Detected Not Detected    METAPNEUMOVIRUS ARRAY Not Detected Not Detected    RHINOVIRUS/ENTEROVIRUS ARRAY Not Detected Not Detected    INFLUENZA A Not Detected Not Detected    INFLUENZA B ARRAY Not Detected Not Detected    PARAINFLUENZA 1  ARRAY Not Detected Not Detected    PARAINFLUENZA 2 ARRAY Not Detected Not Detected    PARAINFLUENZA 3 ARRAY Not Detected Not Detected    PARAINFLUENZA 4 ARRAY Not Detected Not Detected    RSV ARRAY Not Detected Not Detected    BORDETELLA PARAPERTUSSIS (IS 1001) Not Detected Not Detected    BORDETELLA PERTUSSIS ARRAY Not Detected Not Detected    CHLAMYDOPHILA PNEUMONIAE ARRAY Not Detected Not Detected    MYCOPLASMA PNEUMONIAE ARRAY Not Detected Not Detected   COMPREHENSIVE METABOLIC PANEL, NON-FASTING   Result Value Ref Range    SODIUM 142 136 - 145 mmol/L    POTASSIUM 4.8 3.5 - 5.1 mmol/L    CHLORIDE 102 96 - 111 mmol/L    CO2 TOTAL 26 22 - 30 mmol/L    ANION GAP 14 (H) 4 - 13 mmol/L    BUN 63 (H) 8 - 25 mg/dL    CREATININE 2.95 (H) 0.60 - 1.05 mg/dL    BUN/CREA RATIO 9 6 - 22    ALBUMIN 3.2 (L) 3.5 - 5.0 g/dL     CALCIUM 8.0 (L) 8.6 - 10.2 mg/dL    GLUCOSE 95 65 - 284 mg/dL    ALKALINE PHOSPHATASE 52 50 - 130 U/L    ALT (SGPT) 19 8 - 22 U/L    AST (SGOT)  17 8 - 45 U/L    BILIRUBIN TOTAL 0.5 0.3 - 1.3 mg/dL    PROTEIN TOTAL 6.5 6.4 - 8.3 g/dL    ESTIMATED GFR - FEMALE 6 (L) >=60 mL/min/BSA   CREATINE KINASE (CK), TOTAL, SERUM   Result Value Ref Range    CREATINE KINASE 36 25 - 190 U/L   LIPASE   Result Value Ref Range    LIPASE 24 10 - 60 U/L  PT/INR   Result Value Ref Range    PROTHROMBIN TIME 11.3 9.4 - 12.5 seconds    INR 0.98    PTT (PARTIAL THROMBOPLASTIN TIME)   Result Value Ref Range    APTT 27.5 24.0 - 36.5 seconds   TROPONIN-I   Result Value Ref Range    TROPONIN-I HS 17.7 (HH) <=14.0 ng/L ng/L   URINALYSIS WITH MICROSCOPIC REFLEX IF INDICATED [facility] ONLY   Result Value Ref Range    COLOR Yellow Light Yellow, Straw, Yellow    APPEARANCE Clear Clear    PH 8.0 (H) <8.0    LEUKOCYTES Moderate (A) Negative WBCs/uL    NITRITE Negative Negative    PROTEIN 30 (A) Negative, 10  mg/dL    GLUCOSE Negative Negative mg/dL    KETONES Negative Negative, 100  mg/dL    UROBILINOGEN 0.2 <=1.4 mg/dL    BILIRUBIN Negative  Negative mg/dL    BLOOD Negative Negative mg/dL    SPECIFIC GRAVITY 7.829 <1.022   LACTIC ACID LEVEL W/ REFLEX FOR LEVEL >2.0   Result Value Ref Range    LACTIC ACID 0.9 0.5 - 2.2 mmol/L   Magnesium   Result Value Ref Range    MAGNESIUM 2.5 1.8 - 2.6 mg/dL   PHOSPHORUS   Result Value Ref Range    PHOSPHORUS 7.5 (H) 2.4 - 4.7 mg/dL   CBC WITH DIFF   Result Value Ref Range    WBC 8.8 3.7 - 11.0 x10^3/uL    RBC 3.18 (L) 3.85 - 5.22 x10^6/uL    HGB 11.0 (L) 11.5 - 16.0 g/dL    HCT 56.2 (L) 13.0 - 46.0 %    MCV 105.0 (H) 78.0 - 100.0 fL    MCH 34.6 (H) 26.0 - 32.0 pg    MCHC 32.9 31.0 - 35.5 g/dL    RDW-CV 86.5 78.4 - 69.6 %    PLATELETS 288 150 - 400 x10^3/uL    MPV 9.9 8.7 - 12.5 fL    NEUTROPHIL % 76.2 %    LYMPHOCYTE % 16.8 %    MONOCYTE % 4.8 %    EOSINOPHIL % 1.5 %    BASOPHIL % 0.2 %    NEUTROPHIL # 6.67 1.50 - 7.70 x10^3/uL    LYMPHOCYTE # 1.47 1.00 - 4.80 x10^3/uL    MONOCYTE # 0.42 0.20 - 1.10 x10^3/uL    EOSINOPHIL # 0.13 <=0.50 x10^3/uL    BASOPHIL # <0.10 <=0.20 x10^3/uL    IMMATURE GRANULOCYTE % 0.5 0.0 - 1.0 %    IMMATURE GRANULOCYTE # <0.10 <0.10 x10^3/uL   URINALYSIS, MICROSCOPIC   Result Value Ref Range    RBCS 0-2 0 - 2 /hpf    WBCS 5-10 (A) 0 - 2 /hpf    BACTERIA Slight (A) None /hpf    SQUAMOUS EPITHELIAL 0-2 0 - 2 /hpf    TRANSITIONAL EPITHELIAL 0-2 0 - 2 /hpf    RENAL EPITHELIAL 0-2 0 - 2 /hpf     Labs reviewed and interpreted by me.                                           MEDICAL DECISION MAKING       Initial orders placed:   Orders Placed This Encounter    ADULT ROUTINE BLOOD CULTURE, SET OF 2 BOTTLES (BACTERIA AND YEAST)    ADULT ROUTINE BLOOD CULTURE, SET OF  2 BOTTLES (BACTERIA AND YEAST)    EXTENDED RESPIRATORY VIRUS PANEL    URINE CULTURE    CBC/DIFF    COMPREHENSIVE METABOLIC PANEL, NON-FASTING    CREATINE KINASE (CK), TOTAL, SERUM    LIPASE    PT/INR    PTT (PARTIAL THROMBOPLASTIN TIME)    TROPONIN-I    URINALYSIS WITH MICROSCOPIC REFLEX IF INDICATED [facility] ONLY    LACTIC ACID LEVEL W/  REFLEX FOR LEVEL >2.0    Magnesium    PHOSPHORUS    CBC WITH DIFF    URINALYSIS, MICROSCOPIC    ADDITIONAL MICRO TESTING REQUEST - URINE CULTURE    CANCELED: INSERT & MAINTAIN PERIPHERAL IV ACCESS       A scribe is  not available for this encounter, therefore charting may not be in real-time.    10:17 AM:  Initial evaluation.       ED Course as of 07/11/23 2000   Wed Jul 11, 2023   1144 CREATININE(!): 7.18  Consistent with stated history of end-stage renal disease   1144 CARBON DIOXIDE: 26  Normal   1144 BACTERIA(!): Slight  Positive bacteria wbc's and leukocyte esterase, concerning for urinary tract infection   1145 HGB(!): 11.0  Chronic stable   1145 WBC: 8.8  Normal   1205 TROPONIN-I HS(!!): 17.7  Slightly above the normal cut off of 14 but lower than the prior value from 5 days ago   1258 Work up reviewed, paging dr Webb Silversmith   [phone] Was discussed with Dr. Webb Silversmith of Nephrology.  He states that the  urinalysis abnormality is expected and do not treat with antibiotics at this time.  He did note that her calcium is a bit lower for her as she usually runs high calcium.  He stated to remind the patient that she should be taking Tums with her meals.  The plan is for the patient to go directly to dialysis for her usual treatment now.   1316 Patient was rechecked and discussed plan for discharge.  She states that she has been compliant with Tums therapy.  She is going to go to dialysis.  I advised the patient that if she is feeling worse after dialysis she should return to the ED for repeat evaluation.        Medical Decision Making  Problems Addressed:  Body aches: undiagnosed new problem with uncertain prognosis     Details: I discussed with patient that the exact Cause of her body aches is not clear, there is no evidence of a emergency medical condition  ESRD on dialysis (CMS [facility]): chronic illness or injury     Details: The patient has chronic and stage renal disease and will be discharged from the ED to go to her dialysis  unit for her treatment.    Amount and/or Complexity of Data Reviewed  Labs: ordered. Decision-making details documented in ED Course.  Discussion of management or test interpretation with external provider(s): The case was discussed in detail with Dr. Webb Silversmith her nephrologist                                                                    CLINICAL IMPRESSION     Clinical Impression   Body aches (Primary)   ESRD on  dialysis (CMS HCC)                                                  DISPOSITION PLAN         Pre-Disposition Vitals:  Vitals:    07/11/23 0956 07/11/23 1311   BP: (!) 170/68 (!) 146/64   Pulse: 89 81   Resp: 20 16   Temp: 36.6 C (97.9 F) 36.6 C (97.9 F)   SpO2: 100% 100%   Weight: 109 kg (240 lb)    Height: 1.702 m (5\' 7" )    BMI: 37.67            Disposition:  Following the history, physical exam, and ED workup, the patient was deemed stable and suitable for discharge. The patient/caregiver was advised to return to the ED for any new or worsening symptoms. Discharge medications, and follow-up instructions were discussed with the patient/caregiver in detail, who verbalizes understanding. The patient/caregiver is in agreement and is comfortable with the plan of care.    Disposition: Discharged         Current Discharge Medication List        CONTINUE these medications - NO CHANGES were made during your visit.        Details   albuterol sulfate 90 mcg/actuation oral inhaler  Commonly known as: PROVENTIL or VENTOLIN or PROAIR   1-2 Puffs, Inhalation, EVERY 6 HOURS PRN  Qty: 1 Each  Refills: 0     ALPRAZolam 0.25 mg Tablet  Commonly known as: XANAX   0.25 mg, Oral, DAILY PRN  Refills: 0     atorvastatin 40 mg Tablet  Commonly known as: LIPITOR   40 mg, Oral, EVERY EVENING  Qty: 90 Tablet  Refills: 3     carvediloL 3.125 mg Tablet  Commonly known as: COREG   3.125 mg, Oral, 2 TIMES DAILY WITH FOOD  Qty: 90 Tablet  Refills: 3     cyclobenzaprine 5 mg Tablet  Commonly known as: FLEXERIL   5 mg, Oral, 3 TIMES  DAILY PRN  Refills: 0     escitalopram oxalate 10 mg Tablet  Commonly known as: LEXAPRO   10 mg, Oral, NIGHTLY  Refills: 0     Fish OiL 1,000 (120-180) mg Capsule  Generic drug: omega-3-DHA-EPA-fish oil   Oral  Refills: 0     Fish Oil-Omega-3 Fatty Acids 360-1,200 mg Capsule   2 Capsules, Oral, 2 TIMES DAILY  Refills: 0     fluticasone propionate 50 mcg/actuation Spray, Suspension  Commonly known as: FLONASE   1 Spray, Each Nostril, DAILY PRN  Refills: 0     iron sucrose 50 mg iron/2.5 mL Solution  Commonly known as: VENOFER   50 mg  Refills: 0     loperamide 2 mg Capsule  Commonly known as: IMODIUM   4 mg, Oral, EVERY 6 HOURS PRN  Refills: 0     loratadine 10 mg Tablet  Commonly known as: CLARITIN   10 mg, Oral, DAILY PRN  Refills: 0     losartan 25 mg Tablet  Commonly known as: COZAAR   25 mg, Oral, DAILY  Qty: 30 Tablet  Refills: 0     Mounjaro 7.5 mg/0.5 mL Pen Injector  Generic drug: tirzepatide   7.5 mg, Subcutaneous, EVERY 7 DAYS  Refills: 0     nitroGLYCERIN  0.4 mg Tablet, Sublingual  Commonly known as: NITROSTAT   0.4 mg, Sublingual, EVERY 5 MIN PRN, for 3 doses over 15 minutes  Qty: 10 Tablet  Refills: 0     predniSONE 50 mg Tablet  Commonly known as: DELTASONE   50 mg, Oral, DAILY  Qty: 5 Tablet  Refills: 0     promethazine-dextromethorphan 6.25-15 mg/5 mL Syrup  Commonly known as: PHENERGAN-DM   TAKE 5 ML BY MOUTH 4 TIMES DAILY AS NEEDED FOR COUGH  Refills: 0     sevelamer carbonate 800 mg Tablet  Commonly known as: RENVELA   1,600 mg, Oral  Refills: 0     ticagrelor 90 mg Tablet  Commonly known as: BRILINTA   90 mg, Oral, 2 TIMES DAILY  Qty: 180 Tablet  Refills: 3     traMADoL 50 mg Tablet  Commonly known as: ULTRAM   50 mg, Oral, EVERY 6 HOURS PRN  Qty: 30 Tablet  Refills: 0     Triphrocaps 1 mg Capsule  Generic drug: B complex-vitamin C-folic acid   1 Capsule, Oral, NIGHTLY  Refills: 0     vitamin E 100 unit Capsule   100 Units, Oral, 2 TIMES DAILY  Refills: 0            Follow up:   Sallye Lat,  NP  [phone]  [address] [place] [address]  [phone]    Schedule an appointment as soon as possible for a visit   for primary medical care follow up.    Dialysis      Please go directly to your dialysis treatment center for your treatment today          Condition on Disposition: Stable    [name] Rounds, DO        Speech recognition software may be utilized to complete certain portions of this note, if errors are found or the meaning of a statement is unclear, please contact the author for clarification.

## 2023-07-11 NOTE — ED Triage Notes (Signed)
Pt here for generalized all over body aches and pains that she reports started a few days ago. Pt is dialysis pt MWF, was here last Friday d/t flu like symptoms and was placed on inhaler and steroids. States she took 2 tramadol and 2 tylenol this AM with no relief. Pt reports nausea. Ambulatory in triage. No dsitress.

## 2023-07-11 NOTE — Discharge Instructions (Signed)
Please make sure that you are taking your Tums with all of your meal

## 2023-07-11 NOTE — ED Nurses Note (Signed)
Patient discharged home with family.  AVS reviewed with patient/care giver.  A written copy of the AVS and discharge instructions was given to the patient/care giver.  Questions sufficiently answered as needed.  Patient/care giver encouraged to follow up with PCP as indicated.  In the event of an emergency, patient/care giver instructed to call 911 or go to the nearest emergency room.        Current Discharge Medication List        CONTINUE these medications - NO CHANGES were made during your visit.        Details   albuterol sulfate 90 mcg/actuation oral inhaler  Commonly known as: PROVENTIL or VENTOLIN or PROAIR   Take 1-2 Puffs by inhalation Every 6 hours as needed  Qty: 1 Each  Refills: 0     ALPRAZolam 0.25 mg Tablet  Commonly known as: XANAX   Take 1 Tablet (0.25 mg total) by mouth Once per day as needed for Anxiety  Refills: 0     atorvastatin 40 mg Tablet  Commonly known as: LIPITOR   Take 1 Tablet (40 mg total) by mouth Every evening  Qty: 90 Tablet  Refills: 3     carvediloL 3.125 mg Tablet  Commonly known as: COREG   Take 1 Tablet (3.125 mg total) by mouth Twice daily with food  Qty: 90 Tablet  Refills: 3     cyclobenzaprine 5 mg Tablet  Commonly known as: FLEXERIL   Take 1 Tablet (5 mg total) by mouth Three times a day as needed for Muscle spasms  Refills: 0     escitalopram oxalate 10 mg Tablet  Commonly known as: LEXAPRO   Take 1 Tablet (10 mg total) by mouth Every night  Refills: 0     Fish OiL 1,000 (120-180) mg Capsule  Generic drug: omega-3-DHA-EPA-fish oil   Take by mouth  Refills: 0     Fish Oil-Omega-3 Fatty Acids 360-1,200 mg Capsule   Take 2 Capsules by mouth Twice daily  Refills: 0     fluticasone propionate 50 mcg/actuation Spray, Suspension  Commonly known as: FLONASE   Administer 1 Spray into each nostril Once per day as needed (Allergy symptoms (nasal congestion/ runny nose))  Refills: 0     iron sucrose 50 mg iron/2.5 mL Solution  Commonly known as: VENOFER   2.5 mL (50 mg  total)  Refills: 0     loperamide 2 mg Capsule  Commonly known as: IMODIUM   Take 2 Capsules (4 mg total) by mouth Every 6 hours as needed (Diarrhea)  Refills: 0     loratadine 10 mg Tablet  Commonly known as: CLARITIN   Take 1 Tablet (10 mg total) by mouth Once per day as needed (Allergies)  Refills: 0     losartan 25 mg Tablet  Commonly known as: COZAAR   Take 1 Tablet (25 mg total) by mouth Once a day for 30 days  Qty: 30 Tablet  Refills: 0     Mounjaro 7.5 mg/0.5 mL Pen Injector  Generic drug: tirzepatide   Inject 0.5 mL (7.5 mg total) under the skin Every 7 days  Refills: 0     nitroGLYCERIN 0.4 mg Tablet, Sublingual  Commonly known as: NITROSTAT   Place 1 Tablet (0.4 mg total) under the tongue Every 5 minutes as needed for Chest pain for up to 10 doses for 3 doses over 15 minutes  Qty: 10 Tablet  Refills: 0     predniSONE 50 mg Tablet  Commonly known as: DELTASONE   Take 1 Tablet (50 mg total) by mouth Once a day for 5 days  Qty: 5 Tablet  Refills: 0     promethazine-dextromethorphan 6.25-15 mg/5 mL Syrup  Commonly known as: PHENERGAN-DM   TAKE 5 ML BY MOUTH 4 TIMES DAILY AS NEEDED FOR COUGH  Refills: 0     sevelamer carbonate 800 mg Tablet  Commonly known as: RENVELA   Take 2 Tablets (1,600 mg total) by mouth  Refills: 0     ticagrelor 90 mg Tablet  Commonly known as: BRILINTA   Take 1 Tablet (90 mg total) by mouth Twice daily  Qty: 180 Tablet  Refills: 3     traMADoL 50 mg Tablet  Commonly known as: ULTRAM   Take 1 Tablet (50 mg total) by mouth Every 6 hours as needed for Pain  Qty: 30 Tablet  Refills: 0     Triphrocaps 1 mg Capsule  Generic drug: B complex-vitamin C-folic acid   Take 1 Capsule by mouth Every night  Refills: 0     vitamin E 100 unit Capsule   Take 1 Capsule (100 Units total) by mouth Twice daily  Refills: 0              Pt has no questions or concerns at time of discharge. Pt ambulatory to lobby with no assistance needed

## 2023-07-14 LAB — URINE CULTURE: URINE CULTURE: >100000 — AB

## 2023-07-16 LAB — ADULT ROUTINE BLOOD CULTURE, SET OF 2 BOTTLES (BACTERIA AND YEAST)
BLOOD CULTURE, ROUTINE: NO GROWTH
BLOOD CULTURE, ROUTINE: NO GROWTH

## 2023-07-27 ENCOUNTER — Emergency Department
Admission: EM | Admit: 2023-07-27 | Discharge: 2023-07-27 | Disposition: A | Discharge status: Home / Self Care | Payer: 59 | Source: Home / Self Care | Attending: Emergency Medicine | Admitting: Emergency Medicine

## 2023-07-27 DIAGNOSIS — B9620 Unspecified Escherichia coli [E. coli] as the cause of diseases classified elsewhere: Secondary | ICD-10-CM | POA: Insufficient documentation

## 2023-07-27 DIAGNOSIS — I120 Hypertensive chronic kidney disease with stage 5 chronic kidney disease or end stage renal disease: Secondary | ICD-10-CM | POA: Insufficient documentation

## 2023-07-27 DIAGNOSIS — Z992 Dependence on renal dialysis: Secondary | ICD-10-CM | POA: Insufficient documentation

## 2023-07-27 DIAGNOSIS — Z7985 Long-term (current) use of injectable non-insulin antidiabetic drugs: Secondary | ICD-10-CM | POA: Insufficient documentation

## 2023-07-27 DIAGNOSIS — R8271 Bacteriuria: Secondary | ICD-10-CM | POA: Insufficient documentation

## 2023-07-27 DIAGNOSIS — E1122 Type 2 diabetes mellitus with diabetic chronic kidney disease: Secondary | ICD-10-CM | POA: Insufficient documentation

## 2023-07-27 DIAGNOSIS — N186 End stage renal disease: Secondary | ICD-10-CM | POA: Insufficient documentation

## 2023-07-27 DIAGNOSIS — Z905 Acquired absence of kidney: Secondary | ICD-10-CM

## 2023-07-27 MED ORDER — SODIUM CHLORIDE 0.9 % INTRAVENOUS SOLUTION
2.5000 mg/kg | INTRAVENOUS | Status: AC
Start: 2023-07-27 — End: 2023-07-27
  Administered 2023-07-27: 200 mg via INTRAVENOUS
  Administered 2023-07-27: 0 mg via INTRAVENOUS
  Filled 2023-07-27: qty 5

## 2023-07-27 MED ORDER — SODIUM CHLORIDE 0.9 % INTRAVENOUS SOLUTION
7.0000 mg/kg | INTRAVENOUS | Status: DC
Start: 2023-07-27 — End: 2023-07-27

## 2023-07-27 NOTE — ED Nurses Note (Signed)
Patient discharged home with family.  AVS reviewed with patient/care giver.  A written copy of the AVS and discharge instructions was given to the patient/care giver.  Questions sufficiently answered as needed.  Patient/care giver encouraged to follow up with PCP as indicated.  In the event of an emergency, patient/care giver instructed to call 911 or go to the nearest emergency room.         Current Discharge Medication List        CONTINUE these medications - NO CHANGES were made during your visit.        Details   albuterol sulfate 90 mcg/actuation oral inhaler  Commonly known as: PROVENTIL or VENTOLIN or PROAIR   Take 1-2 Puffs by inhalation Every 6 hours as needed  Qty: 1 Each  Refills: 0     ALPRAZolam 0.25 mg Tablet  Commonly known as: XANAX   Take 1 Tablet (0.25 mg total) by mouth Once per day as needed for Anxiety  Refills: 0     atorvastatin 40 mg Tablet  Commonly known as: LIPITOR   Take 1 Tablet (40 mg total) by mouth Every evening  Qty: 90 Tablet  Refills: 3     carvediloL 3.125 mg Tablet  Commonly known as: COREG   Take 1 Tablet (3.125 mg total) by mouth Twice daily with food  Qty: 90 Tablet  Refills: 3     cyclobenzaprine 5 mg Tablet  Commonly known as: FLEXERIL   Take 1 Tablet (5 mg total) by mouth Three times a day as needed for Muscle spasms  Refills: 0     escitalopram oxalate 10 mg Tablet  Commonly known as: LEXAPRO   Take 1 Tablet (10 mg total) by mouth Every night  Refills: 0     Fish OiL 1,000 (120-180) mg Capsule  Generic drug: omega-3-DHA-EPA-fish oil   Take by mouth  Refills: 0     Fish Oil-Omega-3 Fatty Acids 360-1,200 mg Capsule   Take 2 Capsules by mouth Twice daily  Refills: 0     fluticasone propionate 50 mcg/actuation Spray, Suspension  Commonly known as: FLONASE   Administer 1 Spray into each nostril Once per day as needed (Allergy symptoms (nasal congestion/ runny nose))  Refills: 0     iron sucrose 50 mg iron/2.5 mL Solution  Commonly known as: VENOFER   2.5 mL (50 mg  total)  Refills: 0     loperamide 2 mg Capsule  Commonly known as: IMODIUM   Take 2 Capsules (4 mg total) by mouth Every 6 hours as needed (Diarrhea)  Refills: 0     loratadine 10 mg Tablet  Commonly known as: CLARITIN   Take 1 Tablet (10 mg total) by mouth Once per day as needed (Allergies)  Refills: 0     losartan 25 mg Tablet  Commonly known as: COZAAR   Take 1 Tablet (25 mg total) by mouth Once a day for 30 days  Qty: 30 Tablet  Refills: 0     Mounjaro 7.5 mg/0.5 mL Pen Injector  Generic drug: tirzepatide   Inject 0.5 mL (7.5 mg total) under the skin Every 7 days  Refills: 0     nitroGLYCERIN 0.4 mg Tablet, Sublingual  Commonly known as: NITROSTAT   Place 1 Tablet (0.4 mg total) under the tongue Every 5 minutes as needed for Chest pain for up to 10 doses for 3 doses over 15 minutes  Qty: 10 Tablet  Refills: 0     promethazine-dextromethorphan 6.25-15 mg/5  mL Syrup  Commonly known as: PHENERGAN-DM   TAKE 5 ML BY MOUTH 4 TIMES DAILY AS NEEDED FOR COUGH  Refills: 0     sevelamer carbonate 800 mg Tablet  Commonly known as: RENVELA   Take 2 Tablets (1,600 mg total) by mouth  Refills: 0     ticagrelor 90 mg Tablet  Commonly known as: BRILINTA   Take 1 Tablet (90 mg total) by mouth Twice daily  Qty: 180 Tablet  Refills: 3     traMADoL 50 mg Tablet  Commonly known as: ULTRAM   Take 1 Tablet (50 mg total) by mouth Every 6 hours as needed for Pain  Qty: 30 Tablet  Refills: 0     Triphrocaps 1 mg Capsule  Generic drug: B complex-vitamin C-folic acid   Take 1 Capsule by mouth Every night  Refills: 0     vitamin E 100 unit Capsule   Take 1 Capsule (100 Units total) by mouth Twice daily  Refills: 0            ASK your doctor about these medications.        Details   predniSONE 50 mg Tablet  Commonly known as: DELTASONE  Ask about: Should I take this medication?   Take 1 Tablet (50 mg total) by mouth Once a day for 5 days  Qty: 5 Tablet  Refills: 0

## 2023-07-27 NOTE — ED Provider Notes (Signed)
Kennon Rounds, DO  [facility] - Emergency Department  ED Attending Note  Name: [name]  DOB:  05-31-1966   Date of Service: 07/27/2023  Room: ACA02/ACA02  Primary Care Doctor:Brenda Reece Packer, NP  Patient information was obtained from patient.  History/Exam limitations: none.    Chief Complaint   Patient presents with    Abnormal Lab Result           HISTORY OF PRESENT ILLNESS     [name], date of birth 25-Jan-1966, is a 57 y.o.female who presents to the Emergency Department for a positive urine culture. Patient was seen on 10/16 for generalized body aches when she gave the sample. She reports that she is exhausted but believes this is due to work and having completed dialysis today. Patient reports that she still makes a normal amount of urine. Denies feeling sick, measured fevers, vomiting, diarrhea, or urinary symptoms.                                                    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    Constipation     COVID 07/2019    Depression     Diabetes mellitus (CMS HCC)     Diabetes mellitus, type 2 (CMS HCC)     Diarrhea     Dyspnea on exertion     ESRD (end stage renal disease) (CMS HCC)     Essential hypertension     Exercise intolerance     Fatty liver     Fluid overload 08/21/21    Recent hospitalization for fluid overload, HD initiated 10/18/20    Headache(784.0)     Hemodialysis patient (CMS HCC)     HD  Monday-Wednesday-Friday    History of kidney disease     CKD-Stage 5-HD initiated 10/18/20    History of nephrectomy 03/24/2020    L nephrectomy    Hx of echocardiogram 10/18/2020    Technically difficult d/t pt. body habitus; Left vent. EF est. 55-60%; mildy calcified aortic vlave leaflets-especially right coroanry cusp; Small mobile density on aortic valve-no aortic valve stenosis or regurg; Concentric remodeling; mild MR; Trace AR; Trileaflet aortic vlave; Nl IVC size wiht <50% insp. collapse;       Hyperlipidemia     Hyperparathyroidism (CMS HCC)     Hyperthyroidism     Old records states hyperthyroidism, patient denies, states has hyperparathyroidism    Irritable bowel syndrome     Macular edema     Morbid obesity (CMS HCC)     Obesity     Panic attack  Peripheral edema     ankles/feet    Peripheral neuropathy     bilateral feet    Pneumonia 07/2019; 09/2020    COVID Pneumonia 07/2019; fluid overload/pneumonia with recent hosp. 09/2020    Shortness of breath     SOB after climbing 8 steps     Type 2 diabetes mellitus (CMS HCC)     Wears glasses     reading    White coat hypertension      Past Surgical History:  Past Surgical History:   Procedure Laterality Date    Av fistula placement Left 08/11/2020    Ectopic pregnancy surgery  05/24/1998    Hx cataract removal Bilateral 2016    Hx cholecystectomy  09/10/96    Hx hand surgery Right 2017    Hx other Right 10/18/2020    Hx pelvic laparoscopy  05/24/98    Hx shoulder surgery Left 2017    Hx tubal ligation  09/10/2012    Hymenectomy  1996    Kidney surgery  10/24/17; 03/24/20    Laparoscopic nephrectomy, hand assisted Left 03/24/2020    Laparoscopic partial nephrectomy Left 10/24/2017    Renal biopsy, percutaneous  03/08/2020     Social History:  Social History     Tobacco Use    Smoking status: Never    Smokeless tobacco: Never   Vaping Use    Vaping status: Never Used   Substance Use Topics    Alcohol use: No    Drug use: No     Medications:  Current Outpatient  Medications   Medication Sig    albuterol sulfate (PROVENTIL OR VENTOLIN OR PROAIR) 90 mcg/actuation Inhalation oral inhaler Take 1-2 Puffs by inhalation Every 6 hours as needed    ALPRAZolam (XANAX) 0.25 mg Oral Tablet Take 1 Tablet (0.25 mg total) by mouth Once per day as needed for Anxiety    atorvastatin (LIPITOR) 40 mg Oral Tablet Take 1 Tablet (40 mg total) by mouth Every evening    carvediloL (COREG) 3.125 mg Oral Tablet Take 1 Tablet (3.125 mg total) by mouth Twice daily with food    cyclobenzaprine (FLEXERIL) 5 mg Oral Tablet Take 1 Tablet (5 mg total) by mouth Three times a day as needed for Muscle spasms    escitalopram oxalate (LEXAPRO) 10 mg Oral Tablet Take 1 Tablet (10 mg total) by mouth Every night    Fish Oil-Omega-3 Fatty Acids 360-1,200 mg Oral Capsule Take 2 Capsules by mouth Twice daily    fluticasone (FLONASE) 50 mcg/actuation Nasal Spray, Suspension Administer 1 Spray into each nostril Once per day as needed (Allergy symptoms (nasal congestion/ runny nose))    iron sucrose (VENOFER) 50 mg iron/2.5 mL Intravenous Solution 2.5 mL (50 mg total)    loperamide (IMODIUM) 2 mg Oral Capsule Take 2 Capsules (4 mg total) by mouth Every 6 hours as needed (Diarrhea)    loratadine (CLARITIN) 10 mg Oral Tablet Take 1 Tablet (10 mg total) by mouth Once per day as needed (Allergies)    losartan (COZAAR) 25 mg Oral Tablet Take 1 Tablet (25 mg total) by mouth Once a day for 30 days (Patient taking differently: Take 1 Tablet (25 mg total) by mouth Take in the evening on non-dialysis days (Sun/Tues/Thurs/Sat); does not take on dialysis days)    MOUNJARO 7.5 mg/0.5 mL Subcutaneous Pen Injector Inject 0.5 mL (7.5 mg total) under the skin Every 7 days    nitroGLYCERIN (NITROSTAT) 0.4 mg Sublingual Tablet, Sublingual Place 1 Tablet (0.4  mg total) under the tongue Every 5 minutes as needed for Chest pain for up to 10 doses for 3 doses over 15 minutes    omega-3-DHA-EPA-fish oil (FISH OIL) 1,000 mg (120 mg-180 mg) Oral  Capsule Take by mouth    promethazine-dextromethorphan (PHENERGAN-DM) 6.25-15 mg/5 mL Oral Syrup TAKE 5 ML BY MOUTH 4 TIMES DAILY AS NEEDED FOR COUGH    sevelamer carbonate (RENVELA) 800 mg Oral Tablet Take 2 Tablets (1,600 mg total) by mouth    ticagrelor (BRILINTA) 90 mg Oral Tablet Take 1 Tablet (90 mg total) by mouth Twice daily    traMADoL (ULTRAM) 50 mg Oral Tablet Take 1 Tablet (50 mg total) by mouth Every 6 hours as needed for Pain    TRIPHROCAPS 1 mg Oral Capsule Take 1 Capsule by mouth Every night    vitamin E 100 unit Oral Capsule Take 1 Capsule (100 Units total) by mouth Twice daily     Allergies:  Allergies   Allergen Reactions    Gabapentin Itching    Hydrocodone     Ibuprofen     Vicodin [Hydrocodone-Acetaminophen] Itching                                                      PHYSICAL EXAM     ED Triage Vitals [07/27/23 1740]   BP (Non-Invasive) 118/61   Heart Rate 88   Respiratory Rate 18   Temperature 36 C (96.8 F)   SpO2 98 %   Weight 110 kg (242 lb)   Height 1.702 m (5\' 7" )     Body mass index is 37.9 kg/m.    General: Awake. Alert. no acute distress.  Head:  Atraumatic     Eyes: Anicteric sclera, noninjected conjunctiva. PERRL. EOMI.  ENT:  Oropharynx patent, pink, moist, no erythema, exudates or asymmetry. No stridor.   Neck: Neck is supple, nontender, no adenopathy. No nuchal rigidity.   Lungs: Clear to auscultation bilaterally. No wheezes, rales or rhonchi.  no respiratory distress.  Cardiovascular:  Heart is regular without rubs or gallops. 3/6 murmur.  Abdomen:  Soft, nontender, non-distended.  No peritoneal signs. No pulsatile mass.  Extremities:  No cyanosis, edema, tenderness or asymmetry.  Skin: Skin warm, dry and well-perfused. No petechia or erythema.   Back:  Non-tender to palpation in the midline.    Neurologic: Awake, alert, no lethargy, somnolence or confusion. Moves upper and lower extremities without focal deficits.   Psychiatric: Answers questions appropriately.                                            MEDICAL DECISION MAKING       Initial orders placed:   Orders Placed This Encounter    INSERT & MAINTAIN PERIPHERAL IV ACCESS    gentamicin (GARAMYCIN) 200 mg in NS 100 mL IVPB       20:06 : Initial evaluation is complete at this time. We discussed her test results and possible treatment with antibiotics, which the patient is agreeable with.     20:35 : I checked with the patient at this time to discuss her treatment plan.           Medical Decision Making  Problems Addressed:  Bacteria in urine: undiagnosed new problem with uncertain prognosis     Details: Complex medical decision making given the patient's overall prproximately two weeks ago, she was having some acute symptoms, but those have resolved. Her urine if that time did show, slight, bacteria, and there was a discussion with her nephrologist and a  recommendation for not treating with antibiotics. However, a urine culture was sent, which does show an ESBL E. Coli. I discussed this complexity with the patient and she did want to proceed with antibiotic treatment. I further discuss this with the oncall nephrologist, Dr. Collier Bullock, he stated that he did not think that the patient required any antibiotic treatment at this time. She had received a dose of gentamicin. The plan is for the patient to be discharged home and she will follow up with her regular dialysis on Monday. Further decisions can be made at that point about whether she needs ongoing antibiotic therapy.  ESRD on dialysis (CMS [facility]): chronic illness or injury    Amount and/or Complexity of Data Reviewed  External Data Reviewed: labs.  Discussion of management or test interpretation with external provider(s):   20:20 :  I discussed the patient's case and above findings with Sarah (Pharmacist) who will review the dosing for the antibiotics.  20:28 : I discussed the patient's case and above findings with Dr. Collier Bullock (Nephrology) who says that we do not have to treat the  patient with antibiotics if she is asymptomatic.    Risk  Prescription drug management.                                                   CLINICAL IMPRESSION     Clinical Impression   Bacteria in urine (Primary)   ESRD on dialysis (CMS HCC)                                                DISPOSITION PLAN       Pre-Disposition Vitals:  Vitals:    07/27/23 1740 07/27/23 2130 07/27/23 2145   BP: 118/61 126/68 122/69   Pulse: 88  79   Resp: 18  17   Temp: 36 C (96.8 F)  36.6 C (97.8 F)   SpO2: 98% 100% 99%   Weight: 110 kg (242 lb)     Height: 1.702 m (5\' 7" )     BMI: 37.98             Disposition:Following the history, physical exam, and ED workup, the patient was deemed stable and suitable for discharge. The patient/caregiver was advised to return to the ED for any new or worsening symptoms. Discharge medications, and follow-up instructions were discussed with the patient/caregiver in detail, who verbalizes understanding. The patient/caregiver is in agreement and is comfortable with the plan of care.    Disposition: Discharged         Current Discharge Medication List        CONTINUE these medications - NO CHANGES were made during your visit.        Details   albuterol sulfate 90 mcg/actuation oral inhaler  Commonly known as: PROVENTIL or VENTOLIN or PROAIR   1-2 Puffs, Inhalation, EVERY 6 HOURS PRN  Qty:  1 Each  Refills: 0     ALPRAZolam 0.25 mg Tablet  Commonly known as: XANAX   0.25 mg, Oral, DAILY PRN  Refills: 0     atorvastatin 40 mg Tablet  Commonly known as: LIPITOR   40 mg, Oral, EVERY EVENING  Qty: 90 Tablet  Refills: 3     carvediloL 3.125 mg Tablet  Commonly known as: COREG   3.125 mg, Oral, 2 TIMES DAILY WITH FOOD  Qty: 90 Tablet  Refills: 3     cyclobenzaprine 5 mg Tablet  Commonly known as: FLEXERIL   5 mg, Oral, 3 TIMES DAILY PRN  Refills: 0     escitalopram oxalate 10 mg Tablet  Commonly known as: LEXAPRO   10 mg, Oral, NIGHTLY  Refills: 0     Fish OiL 1,000 (120-180) mg Capsule  Generic drug:  omega-3-DHA-EPA-fish oil   Oral  Refills: 0     Fish Oil-Omega-3 Fatty Acids 360-1,200 mg Capsule   2 Capsules, Oral, 2 TIMES DAILY  Refills: 0     fluticasone propionate 50 mcg/actuation Spray, Suspension  Commonly known as: FLONASE   1 Spray, Each Nostril, DAILY PRN  Refills: 0     iron sucrose 50 mg iron/2.5 mL Solution  Commonly known as: VENOFER   50 mg  Refills: 0     loperamide 2 mg Capsule  Commonly known as: IMODIUM   4 mg, Oral, EVERY 6 HOURS PRN  Refills: 0     loratadine 10 mg Tablet  Commonly known as: CLARITIN   10 mg, Oral, DAILY PRN  Refills: 0     losartan 25 mg Tablet  Commonly known as: COZAAR   25 mg, Oral, DAILY  Qty: 30 Tablet  Refills: 0     Mounjaro 7.5 mg/0.5 mL Pen Injector  Generic drug: tirzepatide   7.5 mg, Subcutaneous, EVERY 7 DAYS  Refills: 0     nitroGLYCERIN 0.4 mg Tablet, Sublingual  Commonly known as: NITROSTAT   0.4 mg, Sublingual, EVERY 5 MIN PRN, for 3 doses over 15 minutes  Qty: 10 Tablet  Refills: 0     promethazine-dextromethorphan 6.25-15 mg/5 mL Syrup  Commonly known as: PHENERGAN-DM   TAKE 5 ML BY MOUTH 4 TIMES DAILY AS NEEDED FOR COUGH  Refills: 0     sevelamer carbonate 800 mg Tablet  Commonly known as: RENVELA   1,600 mg, Oral  Refills: 0     ticagrelor 90 mg Tablet  Commonly known as: BRILINTA   90 mg, Oral, 2 TIMES DAILY  Qty: 180 Tablet  Refills: 3     traMADoL 50 mg Tablet  Commonly known as: ULTRAM   50 mg, Oral, EVERY 6 HOURS PRN  Qty: 30 Tablet  Refills: 0     Triphrocaps 1 mg Capsule  Generic drug: B complex-vitamin C-folic acid   1 Capsule, Oral, NIGHTLY  Refills: 0     vitamin E 100 unit Capsule   100 Units, Oral, 2 TIMES DAILY  Refills: 0            ASK your doctor about these medications.        Details   predniSONE 50 mg Tablet  Commonly known as: DELTASONE  Ask about: Should I take this medication?   50 mg, Oral, DAILY  Qty: 5 Tablet  Refills: 0            Follow up:   [facility]  [address]  [place] [address]  [phone]  On  07/30/2023      [facility] - Emergency Department  [facility]  [place] [address]  [phone]    Return right away, if symptoms worsen         Condition on Disposition: Stable    Kennon Rounds, DO        Speech recognition software may be utilized to complete certain portions of this note, if errors are found or the meaning of a statement is unclear, please contact the author for clarification.       I, Alysha Doolan, SCRIBE scribed for [employer], DO.     Documentation assistance provided for Kennon Rounds, DO  by Estrella Myrtle, SCRIBE. Information recorded by the scribe was done at my direction and has been reviewed and validated by Kennon Rounds, DO.

## 2023-07-27 NOTE — ED Triage Notes (Signed)
Pt presents to ED for call back for positive urine culture. Was seen for generalized body aches about 2 weeks ago and gave urine sample. Just left dialysis today; pt denies any urinary symptoms at this time or 2 weeks ago.

## 2023-08-14 ENCOUNTER — Ambulatory Visit: Payer: 59 | Admitting: CARDIOVASCULAR DISEASE

## 2023-08-14 ENCOUNTER — Encounter (HOSPITAL_COMMUNITY): Payer: Self-pay

## 2023-10-17 ENCOUNTER — Encounter (HOSPITAL_COMMUNITY): Payer: Self-pay

## 2023-10-17 ENCOUNTER — Other Ambulatory Visit: Payer: Self-pay

## 2023-10-17 ENCOUNTER — Emergency Department
Admission: EM | Admit: 2023-10-17 | Discharge: 2023-10-17 | Disposition: A | Discharge status: Home / Self Care | Payer: 59 | Attending: Emergency Medicine | Admitting: Emergency Medicine

## 2023-10-17 DIAGNOSIS — Z5982 Transportation insecurity: Secondary | ICD-10-CM | POA: Insufficient documentation

## 2023-10-17 DIAGNOSIS — Z7901 Long term (current) use of anticoagulants: Secondary | ICD-10-CM | POA: Insufficient documentation

## 2023-10-17 DIAGNOSIS — Z6379 Other stressful life events affecting family and household: Secondary | ICD-10-CM | POA: Insufficient documentation

## 2023-10-17 DIAGNOSIS — I2129 ST elevation (STEMI) myocardial infarction involving other sites: Secondary | ICD-10-CM | POA: Insufficient documentation

## 2023-10-17 DIAGNOSIS — Z1152 Encounter for screening for COVID-19: Secondary | ICD-10-CM | POA: Insufficient documentation

## 2023-10-17 DIAGNOSIS — Z634 Disappearance and death of family member: Secondary | ICD-10-CM | POA: Insufficient documentation

## 2023-10-17 DIAGNOSIS — Z992 Dependence on renal dialysis: Secondary | ICD-10-CM | POA: Insufficient documentation

## 2023-10-17 DIAGNOSIS — F32A Depression, unspecified: Secondary | ICD-10-CM | POA: Insufficient documentation

## 2023-10-17 DIAGNOSIS — N186 End stage renal disease: Secondary | ICD-10-CM | POA: Insufficient documentation

## 2023-10-17 LAB — URINALYSIS WITH MICROSCOPIC REFLEX IF INDICATED BMC/JMC ONLY
BILIRUBIN: NEGATIVE mg/dL
BLOOD: NEGATIVE mg/dL
GLUCOSE: 150 mg/dL — AB
KETONES: NEGATIVE mg/dL
NITRITE: NEGATIVE
PH: 8 — ABNORMAL HIGH (ref <8.0)
PROTEIN: 100 mg/dL — AB
SPECIFIC GRAVITY: 1.013 (ref <1.022)
UROBILINOGEN: <2 mg/dL (ref <=2.0)

## 2023-10-17 LAB — CBC WITH DIFF
BASOPHIL #: <0.1 10*3/uL (ref <=0.20)
BASOPHIL %: 0.4 %
EOSINOPHIL #: 0.16 10*3/uL (ref <=0.50)
EOSINOPHIL %: 1.8 %
HCT: 34.4 % — ABNORMAL LOW (ref 34.8–46.0)
HGB: 11.6 g/dL (ref 11.5–16.0)
IMMATURE GRANULOCYTE #: <0.1 10*3/uL (ref <0.10)
IMMATURE GRANULOCYTE %: 0.6 % (ref 0.0–1.0)
LYMPHOCYTE #: 1.68 10*3/uL (ref 1.00–4.80)
LYMPHOCYTE %: 18.9 %
MCH: 34.9 pg — ABNORMAL HIGH (ref 26.0–32.0)
MCHC: 33.7 g/dL (ref 31.0–35.5)
MCV: 103.6 fL — ABNORMAL HIGH (ref 78.0–100.0)
MONOCYTE #: 0.39 10*3/uL (ref 0.20–1.10)
MONOCYTE %: 4.4 %
MPV: 9.6 fL (ref 8.7–12.5)
NEUTROPHIL #: 6.57 10*3/uL (ref 1.50–7.70)
NEUTROPHIL %: 73.9 %
PLATELETS: 288 10*3/uL (ref 150–400)
RBC: 3.32 10*6/uL — ABNORMAL LOW (ref 3.85–5.22)
RDW-CV: 15.9 % — ABNORMAL HIGH (ref 11.5–15.5)
WBC: 8.9 10*3/uL (ref 3.7–11.0)

## 2023-10-17 LAB — COMPREHENSIVE METABOLIC PANEL, NON-FASTING
ALBUMIN: 3.7 g/dL (ref 3.5–5.0)
ALKALINE PHOSPHATASE: 63 U/L (ref 50–130)
ALT (SGPT): 21 U/L (ref <31)
ANION GAP: 10 mmol/L (ref 4–13)
AST (SGOT): 22 U/L (ref 11–34)
BILIRUBIN TOTAL: 0.6 mg/dL (ref 0.3–1.3)
BUN/CREA RATIO: 6 (ref 6–22)
BUN: 23 mg/dL (ref 8–25)
CALCIUM: 9.9 mg/dL (ref 8.6–10.2)
CHLORIDE: 101 mmol/L (ref 96–111)
CO2 TOTAL: 28 mmol/L (ref 22–30)
CREATININE: 3.83 mg/dL — ABNORMAL HIGH (ref 0.60–1.05)
ESTIMATED GFR - FEMALE: 13 mL/min/BSA — ABNORMAL LOW (ref >=60)
GLUCOSE: 143 mg/dL — ABNORMAL HIGH (ref 65–125)
POTASSIUM: 3.7 mmol/L (ref 3.5–5.1)
PROTEIN TOTAL: 8.1 g/dL (ref 6.4–8.3)
SODIUM: 139 mmol/L (ref 136–145)

## 2023-10-17 LAB — DRUG SCREEN, NO CONFIRMATION, URINE
AMPHETAMINES, URINE: NEGATIVE
BARBITURATES URINE: NEGATIVE
BENZODIAZEPINES URINE: NEGATIVE
BUPRENORPHINE URINE: NEGATIVE
CANNABINOIDS URINE: NEGATIVE
COCAINE METABOLITES URINE: NEGATIVE
CREATININE RANDOM URINE: 79 mg/dL (ref >=20)
ECSTASY/MDMA URINE: NEGATIVE
FENTANYL, RANDOM URINE: NEGATIVE
METHADONE URINE: NEGATIVE
OPIATES URINE (LOW CUTOFF): NEGATIVE
OXYCODONE URINE: NEGATIVE

## 2023-10-17 LAB — URINALYSIS, MICROSCOPIC

## 2023-10-17 LAB — THYROID STIMULATING HORMONE WITH FREE T4 REFLEX: TSH: 1.061 u[IU]/mL (ref 0.350–4.940)

## 2023-10-17 LAB — ETHANOL, SERUM/PLASMA
ETHANOL: <10 mg/dL (ref <10)
ETHANOL: NOT DETECTED

## 2023-10-17 NOTE — ED Nurses Note (Signed)
Discharge AVS reviewed with pt, pt also has safety plan from crisis team. Pt verbalizes understanding and all questions answered. Security made aware of pt discharge. Pt waiting on belongings

## 2023-10-17 NOTE — Behavioral Health (Signed)
[facility] Safety Plan completed with patient. Completed safety plan printed off from [facility] and provided to the patient.

## 2023-10-17 NOTE — ED Nurses Note (Signed)
 Patient resting in bed, no signs of distress noted. 1:1 sitter is in place.

## 2023-10-17 NOTE — ED Nurses Note (Signed)
Patient continues to rest, eyes closed, respirations are even and unlabored. 1:1 sitter in plac.

## 2023-10-17 NOTE — Behavioral Health (Signed)
[facility]    Tashianna Sabatine 58 y.o. female  [birth year]  Married      Support network: Pt says her daughter Diannia Ruder, her son Reuel Boom, and her friend Burna Mortimer  Living arrangements: Pt lives with her husband and three adult children  Can the patient return to living situation post discharge? Pt confirms     Consent     Person giving consent for patient:    [x]  Patient    []    Other (specify name):    Does the patient know who they are?  [x]   Yes []   No  Does the patient know where they are?   [x]   Yes []   No  Does the patient know why they are being assessed?   [x]   Yes []   No    Guardian/POA?   []   Yes       [x]   No       Relationship:     Phone numbers:  Home:                         Cell:                          Work:                 Presentation     Presenting problem:    Pt presents to the ED for a psychiatric evaluation. Pt says that she has been overwhelmed with everything that has been going on with her mentally, physically, and emotionally. She says that her father went into cardiac arrest on 01/01/23 and passed. She says that she ended up having a heart attack a couple months later, on the same day that her daughter had to go to the hospital for a seizure. She says that a couple months after that she discovered that she had missed her opportunity for a perfect kidney match, and she is currently still waiting for a kidney transplant. The patient says that she then found out she had a narrowing heart valve. She says that recently her daughter had another seizure, and they found a kidney stone and two cysts on her kidney while in the ED. Pt states that a couple days ago her car also got repossessed.     Pt says that she was in counselling through her church but stopped after she lost her car and also because she didn't want to stop anywhere and talk about things after work. Pt says she came to the ED because she told the social worker at dialysis that she wants to flip her car off the road. The patient  denies that she would do this to kill herself but instead wants to do it so "someone else will have to take care of me because I have to take care of everybody."     The patient says that she hasn't wanted to do anything but sleep. She doesn't want to leave the house and often just naps during the day so that she doesn't have to deal with things. The patient says that this results in her having issues sleeping at night, but she denies change in energy. Pt has been feeling fidgety at times, but she has also been feeling sluggish at other times. She says it has mostly been her feeling sluggish. Pt says her appetite has not been great, where sometimes she will not eat, and sometimes she will  only eat because she knows she has to. She says that she has lost 80-90 pounds in the last year.     Mental Status     Neurovegetative signs  Sleep pattern        [x]   Yes, increase []  Yes, decrease []  No  Appetite                 []   Yes, increase [x]  Yes, decrease []  No  Psychomotor (slowing or agitation)      [x]   Yes, increase [x]  Yes, decrease []  No  Recent weight gain or loss    []   Yes, increase [x]  Yes, decrease []  No  Level of energy     []   Yes, increase []  Yes, decrease [x]  No  Anhedonia      [x]   Yes, increase []  Yes, decrease []  No  Level of interest     []   Yes, increase [x]  Yes, decrease []  No  Libido       []   Yes, increase []  Yes, decrease [x]  No    Mood:  Depressed     Anxiety:  Low    Agitation:   Denies    Affect: Flat     Speech:  Appropriate                  Thought process:  Goal directed/logical          Thought content/Delusions:  Appropriate/not evident     Perceptual function:  Consistent with reality                            Attention/Concentration:  Good            Intellect:  Average               Judgment:  Fair           Impulse control:  Fair       Insight (self/situation):  Aware      Risk      Safe-T Risk Assessment    Safe-T Risk Level:  moderate     Homicidal Ideation: Pt  denies    Hallucinations: Pt denies    Chemical Dependency - Current/Past abuse of alcohol or drugs (specify drug and routes of use     Substance Use: Pt denies     History of DTs/Seizures? N/A    Medical Treatment Sought for Prior Detox? N/A    Other addiction history     History of and/or Current Issues with Gambling?  Pt denies     Current/Past Treatment History   Previous Diagnosis: Pt has Depression, Anxiety, and gets Panic Attacks     Previous Psychiatric Treatment and Medication Trials: Pt is prescribed Xanax and Lexapro through her PCP    Treatment and Medication Compliance: Pt confirms    Previous Psychiatric Hospitalizations: Pt denies    Previous Suicide Attempts: Pt denies    Currently in Treatment With: no one    Family and Personal History     Family Stressors: Pt says she lost her dad last April and that her daughter has been having seizures. Pt feels that she does a lot for people who do not return her care.     Family Hx of Mental Illness: Pt says that there is depression and anxiety on her dad's side. She denies family hx of suicide     History of Violence: Pt denies  Access to Guns: Pt confirms     Legal History: Pt denies     Current or History of Abuse (Physical, Sexual, Emotional, Exploitation): Pt denies    Current or History of Trauma: Pt denies    Other Pertinent Information     Veteran Status/Military History: Pt denies    Support Network: Pt says her daughter Diannia Ruder, her son Reuel Boom, and her friend Burna Mortimer.    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     Spiritual/Religious Preferences: Pt says she is [place] and goes to [employer] of Illness: Pt says she feels like she needs additional help but she denies any acute symptoms at this time.     Strengths : Pt says she cares a lot, can stand up for herself, can sing, is a social butterfly, and can cook really well.   Weakness: Pt says she is  there too much for people who are not there for her.      Hobbies and Interests: Pt likes to go with her daughter, go shopping, go for a ride, and go out to eat.     Medical/Psychiatric Screening Information   Current Medical Problems (Include recent chest pain, headache, vision changes, seizures, etc) See H & P      Recent Surgical History (within the past year): See H & P      ADLs:          [x]   Independent    []  Assisted     [x]   Continent    []  Incontinent   []  Hx of falls           [x]  Ambulatory    []  With assistance    []  Cane   []  Walker    Medications: See H & P    Summary     Admission criteria    []    Yes [x]   No  Suicide ideation      Suicide :   []   Attempt []  Plan    []   Self-mutilating    []    Yes [x]   No  Assaultive destructive behavior    []    Yes [x]   No  Homicidal ideation with poor impulse control    []    Yes [x]   No  Disorientation/Mental impairment poses risk    []    Yes [x]   No    Psychiatric symptoms severe to cause bizarre, disordered behavior    [x]    Yes []   No  Sleep/Nutrition disturbance poses a risk    []    Yes [x]   No  Acute exacerbation of chronic symptoms    []    Yes [x]   No  Psychotherapeutic medication withdrawal, dose change, toxic effects or non-compliance    []    Yes [x]   No  Requires intensive follow-up after inpatient treatment    []    Yes [x]   No  Failed less intensive level of care    []    Yes [x]   No  Other    Comments regarding admission criteria:        Physician Consultation/Disposition      Dr Vena Austria was consulted regarding the clinical evaluation on 10/17/23 at 20:41 and recommended that the patient follow up outpatient with safety contract    Insurance: PEIA/Medicare A & B    Disposition status:  Other Safety Plan completed, Outpatient contacted for potential appointment  Mcneil Sober, Behavioral Health Crisis Worker  10/17/2023, 19:26, 19:48

## 2023-10-17 NOTE — ED Provider Notes (Signed)
Richardo Hanks MD  [facility] - Emergency Department  ED Attending Note        Chief Complaint   Patient presents with    Suicidal Thoughts      Patient ambulated in to the ED stated she is having thoughts of suicide. States she is having thoughts of "wanting to drive her car into a tree", not to die, but rather to be cared for in the way she cares for others. Patient came from dialysis.            HISTORY OF PRESENT ILLNESS     [name], date of birth 1966/03/01, is a 58 y.o.female who presents to the Emergency Department for suicidal thoughts. Patient states that she is overwhelmed due to multiple stressors within the past year (father passing away, daughter having seizures, patient having MI and missing a match for a kidney, and having her car repossessed). She denies any thoughts of killing herself or having a plan. She has fantasized about running her car into a tree not to harm herself but rather to be cared for in the way that she does so for others. She lives with her husband and children and has a support system at her church. Patient reports increased sleep and no changes in appetite. She reports nosebleeds today due to having no heat in the home. Patient is very tearful during evaluation. Patient presents from dialysis today after a full session. Patient goes MWF but missed her session two days ago. Patient reports her left arm fistula had continuously been bleeding after her session today and she is on blood thinners. Upon arrival, she does not have any active bleeding.                                                    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 body habitus; Left vent. EF est. 55-60%; mildy calcified aortic vlave leaflets-especially right coroanry cusp; Small mobile density on aortic valve-no aortic valve stenosis or regurg; Concentric remodeling; mild MR; Trace AR; Trileaflet aortic vlave; Nl IVC size wiht <50% insp. collapse;       Hyperlipidemia     Hyperparathyroidism (CMS HCC)     Hyperthyroidism     Old records states hyperthyroidism, patient denies, states has hyperparathyroidism    Irritable bowel syndrome     Macular edema     Morbid obesity (CMS HCC)     Obesity     Panic attack     Peripheral edema     ankles/feet    Peripheral neuropathy     bilateral feet    Pneumonia 07/2019; 09/2020    COVID Pneumonia 07/2019; fluid overload/pneumonia with recent hosp. 09/2020    Shortness of breath     SOB after climbing 8 steps     Type 2 diabetes mellitus (CMS HCC)     Wears glasses     reading    White coat hypertension      Past Surgical History:  Past Surgical History:   Procedure Laterality Date    Av fistula placement Left 08/11/2020    Ectopic pregnancy surgery  05/24/1998    Hx cataract removal  Bilateral 2016    Hx cholecystectomy  09/10/96    Hx hand surgery Right 2017    Hx other Right 10/18/2020    Hx pelvic laparoscopy  05/24/98    Hx shoulder surgery Left 2017    Hx tubal ligation  09/10/2012    Hymenectomy  1996    Kidney surgery  10/24/17; 03/24/20    Laparoscopic nephrectomy, hand assisted Left 03/24/2020    Laparoscopic partial nephrectomy Left 10/24/2017    Renal biopsy, percutaneous  03/08/2020     Family History:  Family Medical History:       Problem Relation (Age of Onset)    Breast Cancer Paternal Aunt    Cancer Maternal Grandmother    Diabetes Father, Paternal Aunt    High Cholesterol Maternal Uncle, Mother    Hypertension (High Blood Pressure) Maternal Uncle, Father          Social History:  Social History     Tobacco Use    Smoking status: Never    Smokeless tobacco: Never   Vaping Use    Vaping status: Never Used   Substance Use Topics    Alcohol use: No    Drug use: No     Medications:  Current Outpatient Medications   Medication Sig    albuterol sulfate (PROVENTIL OR VENTOLIN OR PROAIR) 90 mcg/actuation Inhalation oral inhaler Take 1-2 Puffs by inhalation Every 6 hours as needed    ALPRAZolam (XANAX) 0.25 mg Oral Tablet Take 1 Tablet (0.25 mg total) by mouth Once per day as needed for Anxiety    atorvastatin (LIPITOR) 40 mg Oral Tablet Take 1 Tablet (40 mg total) by mouth Every evening    carvediloL (COREG) 3.125 mg Oral Tablet Take 1 Tablet (3.125 mg total) by mouth Twice daily with food    cyclobenzaprine (FLEXERIL) 5 mg Oral Tablet Take 1 Tablet (5 mg total) by mouth Three times a day as needed for Muscle spasms    escitalopram oxalate (LEXAPRO) 10 mg Oral Tablet Take 1 Tablet (10 mg total)  by mouth Every night    Fish Oil-Omega-3 Fatty Acids 360-1,200 mg Oral Capsule Take 2 Capsules by mouth Twice daily    fluticasone (FLONASE) 50 mcg/actuation Nasal Spray, Suspension Administer 1 Spray into each nostril Once per day as needed (Allergy symptoms (nasal congestion/ runny nose))    iron  sucrose (VENOFER) 50 mg iron/2.5 mL Intravenous Solution 2.5 mL (50 mg total)    loperamide (IMODIUM) 2 mg Oral Capsule Take 2 Capsules (4 mg total) by mouth Every 6 hours as needed (Diarrhea)    loratadine (CLARITIN) 10 mg Oral Tablet Take 1 Tablet (10 mg total) by mouth Once per day as needed (Allergies)    losartan (COZAAR) 25 mg Oral Tablet Take 1 Tablet (25 mg total) by mouth Once a day for 30 days (Patient taking differently: Take 1 Tablet (25 mg total) by mouth Take in the evening on non-dialysis days (Sun/Tues/Thurs/Sat); does not take on dialysis days)    MOUNJARO 7.5 mg/0.5 mL Subcutaneous Pen Injector Inject 0.5 mL (7.5 mg total) under the skin Every 7 days    nitroGLYCERIN (NITROSTAT) 0.4 mg Sublingual Tablet, Sublingual Place 1 Tablet (0.4 mg total) under the tongue Every 5 minutes as needed for Chest pain for up to 10 doses for 3 doses over 15 minutes    omega-3-DHA-EPA-fish oil (FISH OIL) 1,000 mg (120 mg-180 mg) Oral Capsule Take by mouth    promethazine-dextromethorphan (PHENERGAN-DM) 6.25-15 mg/5 mL Oral Syrup TAKE 5 ML BY MOUTH 4 TIMES DAILY AS NEEDED FOR COUGH    sevelamer carbonate (RENVELA) 800 mg Oral Tablet Take 2 Tablets (1,600 mg total) by mouth    ticagrelor (BRILINTA) 90 mg Oral Tablet Take 1 Tablet (90 mg total) by mouth Twice daily    traMADoL (ULTRAM) 50 mg Oral Tablet Take 1 Tablet (50 mg total) by mouth Every 6 hours as needed for Pain    TRIPHROCAPS 1 mg Oral Capsule Take 1 Capsule by mouth Every night    vitamin E 100 unit Oral Capsule Take 1 Capsule (100 Units total) by mouth Twice daily     Allergies:  Allergies   Allergen Reactions    Gabapentin Itching    Hydrocodone     Ibuprofen     Vicodin [Hydrocodone-Acetaminophen] Itching     REVIEW OF NURSING NOTES: I HAVE REVIEWED ALL NURSING NOTES, INCLUDING TRIAGE, VITALS, PATIENT'S PMH, FMH, SURGICAL, AND SOCIAL HISTORY.                                                   PHYSICAL EXAM     Vitals:  ED Triage Vitals [10/17/23 1522]   BP  (Non-Invasive) 134/71   Heart Rate 97   Respiratory Rate 18   Temperature 36.5 C (97.7 F)   SpO2 100 %     General: Awake. Alert. Well developed. Well nourished. No acute distress. Patient is tearful.  Head: Normocephalic. Atraumatic.  Eyes: PERRL. EOMI. Normal conjunctiva.  ENT: Normal external nose and ears. Moist oral membranes. No rhinorrhea.  Neck: Supple. Trachea midline. Full ROM  Respiratory: No respiratory distress. Clear to auscultation bilaterally. No wheezes, rales, or rhonchi.   Cardiac: Normal rate. Regular rhythm. No murmurs or gallops.   Abdomen: Soft. Nondistended. Nontender. No rebound or guarding. No masses.  Back: Normal ROM.  Musculoskeletal: Normal ROM. No deformities. Peripheral pulses intact and symmetrical  in all extremities. Dried intact bandage over left upper arm fistula without active bleeding. Palpable bruit.  Skin: Warm, dry, and intact.  Neuro: Awake, alert, and oriented x 3. Normal speech. Sensation grossly intact. Strength 5/5 in all four extremities.  Psych: Cooperative. Flat mood and affect. No visual or auditory hallucinations. Positive SI.                                                DIAGNOSTIC STUDIES     Labs:    Results for orders placed or performed during the hospital encounter of 10/17/23   COMPREHENSIVE METABOLIC PANEL, NON-FASTING   Result Value Ref Range    SODIUM 139 136 - 145 mmol/L    POTASSIUM 3.7 3.5 - 5.1 mmol/L    CHLORIDE 101 96 - 111 mmol/L    CO2 TOTAL 28 22 - 30 mmol/L    ANION GAP 10 4 - 13 mmol/L    BUN 23 8 - 25 mg/dL    CREATININE 8.11 (H) 0.60 - 1.05 mg/dL    BUN/CREA RATIO 6 6 - 22    ALBUMIN 3.7 3.5 - 5.0 g/dL     CALCIUM 9.9 8.6 - 91.4 mg/dL    GLUCOSE 782 (H) 65 - 125 mg/dL    ALKALINE PHOSPHATASE 63 50 - 130 U/L    ALT (SGPT) 21 <31 U/L    AST (SGOT)  22 11 - 34 U/L    BILIRUBIN TOTAL 0.6 0.3 - 1.3 mg/dL    PROTEIN TOTAL 8.1 6.4 - 8.3 g/dL    ESTIMATED GFR - FEMALE 13 (L) >=60 mL/min/BSA   DRUG SCREEN, NO CONFIRMATION, URINE   Result Value Ref  Range    AMPHETAMINES, URINE Negative Negative    BARBITURATES URINE Negative Negative    BENZODIAZEPINES URINE Negative Negative    BUPRENORPHINE URINE Negative Negative    CANNABINOIDS URINE Negative Negative    COCAINE METABOLITES URINE Negative Negative    METHADONE URINE Negative Negative    OPIATES URINE (LOW CUTOFF) Negative Negative    OXYCODONE URINE Negative Negative    ECSTASY/MDMA URINE Negative Negative    FENTANYL, RANDOM URINE Negative Negative    CREATININE RANDOM URINE 79 >=20 mg/dL   ETHANOL, SERUM   Result Value Ref Range    ETHANOL None Detected     ETHANOL <10 <10 mg/dL   THYROID STIMULATING HORMONE WITH FREE T4 REFLEX   Result Value Ref Range    TSH 1.061 0.350 - 4.940 uIU/mL   URINALYSIS WITH MICROSCOPIC REFLEX IF INDICATED [facility] ONLY   Result Value Ref Range    COLOR Light Yellow Light Yellow, Straw, Yellow    APPEARANCE Clear Clear    PH 8.0 (H) <8.0    LEUKOCYTES Small (A) Negative WBCs/uL    NITRITE Negative Negative    PROTEIN 100 (A) Negative, 10  mg/dL    GLUCOSE 956 (A) Negative mg/dL    KETONES Negative Negative, 100  mg/dL    UROBILINOGEN < 2.0 <=2.1 mg/dL    BILIRUBIN Negative Negative mg/dL    BLOOD Negative Negative mg/dL    SPECIFIC GRAVITY 3.086 <1.022   CBC WITH DIFF   Result Value Ref Range    WBC 8.9 3.7 - 11.0 x10^3/uL    RBC 3.32 (L) 3.85 - 5.22 x10^6/uL    HGB 11.6 11.5 - 16.0 g/dL  HCT 34.4 (L) 34.8 - 46.0 %    MCV 103.6 (H) 78.0 - 100.0 fL    MCH 34.9 (H) 26.0 - 32.0 pg    MCHC 33.7 31.0 - 35.5 g/dL    RDW-CV 16.1 (H) 09.6 - 15.5 %    PLATELETS 288 150 - 400 x10^3/uL    MPV 9.6 8.7 - 12.5 fL    NEUTROPHIL % 73.9 %    LYMPHOCYTE % 18.9 %    MONOCYTE % 4.4 %    EOSINOPHIL % 1.8 %    BASOPHIL % 0.4 %    NEUTROPHIL # 6.57 1.50 - 7.70 x10^3/uL    LYMPHOCYTE # 1.68 1.00 - 4.80 x10^3/uL    MONOCYTE # 0.39 0.20 - 1.10 x10^3/uL    EOSINOPHIL # 0.16 <=0.50 x10^3/uL    BASOPHIL # <0.10 <=0.20 x10^3/uL    IMMATURE GRANULOCYTE % 0.6 0.0 - 1.0 %    IMMATURE GRANULOCYTE # <0.10  <0.10 x10^3/uL   URINALYSIS, MICROSCOPIC   Result Value Ref Range    RBCS 0-2 0 - 2 /hpf    WBCS 2-5 (A) 0 - 2 /hpf    BACTERIA Slight (A) None /hpf    SQUAMOUS EPITHELIAL 5-10 (A) 0 - 2 /hpf    TRANSITIONAL EPITHELIAL 0-2 0 - 2 /hpf   Labs reviewed and interpreted by me.    Real-time EKG Interpretation: 12 Lead EKG interpreted by me shows normal sinus rhythm, rate of 93 bpm with no acute ischemic changes                                       MEDICAL DECISION MAKING                16:28: Patient is medically cleared for behavioral health evaluation.    20:32: Spoke with Dorathy Daft (crisis). Patient is considered moderate risk but 1:1 will be discontinued. She has asked me to order COVID/flu/RSV swab.    20:55: Spoke with Dorathy Daft (crisis) who states patient will be discharged with a safety plan. Patient will follow up outpatient for medication management.    Patient is comfortable being discharged with outpatient behavioral health follow-up.  Patient did not have any SI just wanted to get help at rest.  If symptoms worsen she can return to ED.    Medical Decision Making  Amount and/or Complexity of Data Reviewed  Labs: ordered.  ECG/medicine tests: ordered.           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 [place] [address]  [phone]    Call in 1 day  Return to the ED, If symptoms worsen      Condition at Disposition: Stable    I, Berline Lopes, SCRIBE, scribed for Richardo Hanks MD on  10/17/2023 at 3:06 PM     Documentation assistance provided for Richardo Hanks MD by scribe Berline Lopes, SCRIBE, SCRIBE. Information recorded by the scribe was done at my direction and has been reviewed and validated by me, Richardo Hanks MD      Note has been documented by Berline Lopes on 10/17/2023

## 2023-10-17 NOTE — ED Nurses Note (Signed)
Crisis aware of consult

## 2023-10-17 NOTE — Behavioral Health (Signed)
Reviewed SAFE-T assessment with Dr Merilynn Finland @ 20:35. Risk Score determined to be moderate. The following additional precautions were recommended by the ED attending: 1:1 to be discontinued.

## 2023-10-17 NOTE — ED Nurses Note (Signed)
1:1 sitter in place, patient changed out of clothes and into paper scrubs. Patient cooperative and pleasant with staff.

## 2023-10-17 NOTE — ED Nurses Note (Signed)
Report given to Elizabeth, RN.

## 2023-10-17 NOTE — ED Nurses Note (Signed)
Pt's daughter Diannia Ruder updated per pt's request.

## 2023-10-19 ENCOUNTER — Telehealth (INDEPENDENT_AMBULATORY_CARE_PROVIDER_SITE_OTHER): Payer: Self-pay

## 2023-10-19 DIAGNOSIS — Z136 Encounter for screening for cardiovascular disorders: Secondary | ICD-10-CM

## 2023-10-19 DIAGNOSIS — R45851 Suicidal ideations: Secondary | ICD-10-CM

## 2023-10-19 DIAGNOSIS — I252 Old myocardial infarction: Secondary | ICD-10-CM

## 2023-10-19 DIAGNOSIS — R9431 Abnormal electrocardiogram [ECG] [EKG]: Secondary | ICD-10-CM

## 2023-10-19 LAB — ECG 12-LEAD
Atrial Rate: 93 {beats}/min
Calculated P Axis: 21 degrees
Calculated R Axis: -22 degrees
Calculated T Axis: 53 degrees
PR Interval: 108 ms
QRS Duration: 82 ms
QT Interval: 396 ms
QTC Calculation: 492 ms
Ventricular rate: 93 {beats}/min

## 2023-10-19 NOTE — Telephone Encounter (Signed)
LVM for Patient to call back and get scheduled as a New Patient.   ED f/u per Dorathy Daft (Crisis worker)    Grayce Sessions, MA

## 2023-10-30 ENCOUNTER — Other Ambulatory Visit (HOSPITAL_COMMUNITY): Payer: Self-pay | Admitting: Nurse Practitioner

## 2023-10-30 DIAGNOSIS — R791 Abnormal coagulation profile: Secondary | ICD-10-CM

## 2023-11-02 ENCOUNTER — Encounter (HOSPITAL_COMMUNITY): Payer: Self-pay | Admitting: Student in an Organized Health Care Education/Training Program

## 2023-11-02 ENCOUNTER — Other Ambulatory Visit: Payer: Self-pay

## 2023-11-02 ENCOUNTER — Other Ambulatory Visit (HOSPITAL_COMMUNITY): Payer: Self-pay

## 2023-11-02 ENCOUNTER — Ambulatory Visit
Admission: RE | Admit: 2023-11-02 | Discharge: 2023-11-02 | Disposition: A | Discharge status: Home / Self Care | Payer: 59 | Source: Ambulatory Visit | Attending: Nurse Practitioner | Admitting: Nurse Practitioner

## 2023-11-02 DIAGNOSIS — T82858A Stenosis of vascular prosthetic devices, implants and grafts, initial encounter: Secondary | ICD-10-CM | POA: Insufficient documentation

## 2023-11-02 DIAGNOSIS — Y832 Surgical operation with anastomosis, bypass or graft as the cause of abnormal reaction of the patient, or of later complication, without mention of misadventure at the time of the procedure: Secondary | ICD-10-CM | POA: Insufficient documentation

## 2023-11-02 DIAGNOSIS — R791 Abnormal coagulation profile: Secondary | ICD-10-CM | POA: Insufficient documentation

## 2023-11-02 MED ORDER — FENTANYL (PF) 50 MCG/ML INJECTION SOLUTION
Freq: Once | INTRAMUSCULAR | Status: AC | PRN
Start: 2023-11-02 — End: 2023-11-02
  Administered 2023-11-02: 100 ug via INTRAVENOUS

## 2023-11-02 MED ORDER — IOHEXOL 300 MG IODINE/ML INTRAVENOUS SOLUTION
INTRAVENOUS | Status: AC
Start: 2023-11-02 — End: 2023-11-02
  Filled 2023-11-02: qty 50

## 2023-11-02 MED ORDER — LIDOCAINE HCL 10 MG/ML (1 %) INJECTION SOLUTION
Freq: Once | INTRAMUSCULAR | Status: AC | PRN
Start: 2023-11-02 — End: 2023-11-02
  Administered 2023-11-02: 4 mL via INTRADERMAL

## 2023-11-02 MED ORDER — HEPARIN (PORCINE) (PF) 2,000 UNIT/1,000 ML IN 0.9 % SODIUM CHLORIDE IV
INTRAVENOUS | Status: AC
Start: 2023-11-02 — End: 2023-11-02
  Filled 2023-11-02: qty 1000

## 2023-11-02 MED ORDER — HEPARIN (PORCINE) 1,000 UNIT/ML INJECTION SOLUTION
INTRAMUSCULAR | Status: AC
Start: 2023-11-02 — End: 2023-11-02
  Filled 2023-11-02: qty 1

## 2023-11-02 MED ORDER — HEPARIN (PORCINE) 1,000 UNIT/ML INJECTION SOLUTION
Freq: Once | INTRAMUSCULAR | Status: AC | PRN
Start: 2023-11-02 — End: 2023-11-02
  Administered 2023-11-02: 1000 [IU] via INTRAVENOUS

## 2023-11-02 MED ORDER — LIDOCAINE HCL 10 MG/ML (1 %) INJECTION SOLUTION
INTRAMUSCULAR | Status: AC
Start: 2023-11-02 — End: 2023-11-02
  Filled 2023-11-02: qty 20

## 2023-11-02 MED ORDER — FENTANYL (PF) 50 MCG/ML INJECTION SOLUTION
INTRAMUSCULAR | Status: AC
Start: 2023-11-02 — End: 2023-11-02
  Filled 2023-11-02: qty 2

## 2023-12-20 ENCOUNTER — Other Ambulatory Visit (HOSPITAL_COMMUNITY): Payer: Self-pay | Admitting: Nephrology

## 2023-12-20 DIAGNOSIS — R791 Abnormal coagulation profile: Secondary | ICD-10-CM

## 2023-12-20 NOTE — Progress Notes (Signed)
 PROVIDER ROUNDING NOTE BASIC HD - [name] - Chart #: [id number]    Clinic: [id number]    Modality: IHD    Method of Interaction: Face to face    Comments: Feeling good, no concerns    Date of Interaction: 12/19/2023    Patient is stable    Patient issues include:    reports she is having a parathyroidectomy at [facility] next week, however she noticed that EKG was abnormal/different compared to last year and is wondering if she needs cardiac clearance  otherwise, she feels fine  denies headache, dizziness, chest pain, shortness of breath, nausea, abdominal pain    Prior Treatment:     12/19/2023     Dialyzer:            180NRe Optiflux     Dialysate:           2.0 K, 2.5 Ca, 1.0 Mg, 100 Dextrose  (G2251)     Actual Time:         02:42          Prescribed Time:     3:15     Avg BFR:             490            Avg DFR:             510     Wt Gain (kg):        1.40           EDW (kg):            112.40     post Wt (kg):            HOME MEDICATIONS     aspirin  (aspirin ) 81 mg, oral, 1 tablet once a day     atorvastatin  (atorvastatin ) 40 mg, oral, 1 tablet every evening     Brilinta  (ticagrelor ) 90 mg, oral, 1 tablet twice a day     hydralazine  (hydralazine ) 10 mg, oral, 1 tablet three times a day     Lexapro  (escitalopram  oxalate) 10 mg, oral, 1 tablet every night     metoprolol  succinate (metoprolol  succinate) 25 mg, oral, 1 tablet once a day     Mounjaro (tirzepatide) 12.5 mg/0.5 mL, subQ, 1 pen injector once a week     Tums (calcium  carbonate) 200 mg calcium  (500 mg), oral, 2 tablet twice a day     zolpidem  (zolpidem ) 5 mg, oral, 1 tablet [for insomnia]    Alerts    continue current HD script    dialyze to EDW, avoid cramping, avoid hypotension    if patient needs cardiac clearance prior to parathyroidectomy will send referral to cardiology    Karolyn Gable, NP

## 2023-12-21 ENCOUNTER — Ambulatory Visit
Admission: RE | Admit: 2023-12-21 | Discharge: 2023-12-21 | Disposition: A | Discharge status: Home / Self Care | Source: Ambulatory Visit | Attending: Nephrology

## 2023-12-21 ENCOUNTER — Other Ambulatory Visit: Payer: Self-pay

## 2023-12-21 DIAGNOSIS — T82858A Stenosis of vascular prosthetic devices, implants and grafts, initial encounter: Secondary | ICD-10-CM | POA: Insufficient documentation

## 2023-12-21 DIAGNOSIS — Y832 Surgical operation with anastomosis, bypass or graft as the cause of abnormal reaction of the patient, or of later complication, without mention of misadventure at the time of the procedure: Secondary | ICD-10-CM | POA: Insufficient documentation

## 2023-12-21 DIAGNOSIS — R791 Abnormal coagulation profile: Secondary | ICD-10-CM | POA: Insufficient documentation

## 2023-12-21 MED ORDER — LIDOCAINE HCL 10 MG/ML (1 %) INJECTION SOLUTION
Freq: Once | INTRAMUSCULAR | Status: AC | PRN
Start: 2023-12-21 — End: 2023-12-21
  Administered 2023-12-21: 2 mL via INTRADERMAL

## 2023-12-21 MED ORDER — HEPARIN (PORCINE) (PF) 2,000 UNIT/1,000 ML IN 0.9 % SODIUM CHLORIDE IV
INTRAVENOUS | Status: AC
Start: 2023-12-21 — End: 2023-12-21
  Filled 2023-12-21: qty 1000

## 2023-12-21 MED ORDER — FENTANYL (PF) 50 MCG/ML INJECTION SOLUTION
Freq: Once | INTRAMUSCULAR | Status: AC | PRN
Start: 2023-12-21 — End: 2023-12-21
  Administered 2023-12-21: 100 ug via INTRAVENOUS

## 2023-12-21 MED ORDER — LIDOCAINE HCL 10 MG/ML (1 %) INJECTION SOLUTION
INTRAMUSCULAR | Status: AC
Start: 2023-12-21 — End: 2023-12-21
  Filled 2023-12-21: qty 20

## 2023-12-21 MED ORDER — HEPARIN (PORCINE) 1,000 UNIT/ML INJECTION SOLUTION
Freq: Once | INTRAMUSCULAR | Status: AC | PRN
Start: 2023-12-21 — End: 2023-12-21
  Administered 2023-12-21: 1000 [IU] via INTRAVENOUS

## 2023-12-21 MED ORDER — IOHEXOL 300 MG IODINE/ML INTRAVENOUS SOLUTION
INTRAVENOUS | Status: AC
Start: 2023-12-21 — End: 2023-12-21
  Filled 2023-12-21: qty 50

## 2023-12-21 MED ORDER — HEPARIN (PORCINE) 1,000 UNIT/ML INJECTION SOLUTION
INTRAMUSCULAR | Status: AC
Start: 2023-12-21 — End: 2023-12-21
  Filled 2023-12-21: qty 1

## 2023-12-21 MED ORDER — IOHEXOL 300 MG IODINE/ML INTRAVENOUS SOLUTION
Freq: Once | INTRAVENOUS | Status: AC | PRN
Start: 2023-12-21 — End: 2023-12-21
  Administered 2023-12-21: 30 mL via INTRAVENOUS

## 2023-12-21 MED ORDER — FENTANYL (PF) 50 MCG/ML INJECTION SOLUTION
INTRAMUSCULAR | Status: AC
Start: 2023-12-21 — End: 2023-12-21
  Filled 2023-12-21: qty 2

## 2024-01-28 ENCOUNTER — Observation Stay: Admission: EM | Admit: 2024-01-28 | Discharge: 2024-01-29 | Disposition: A | Discharge status: Home / Self Care

## 2024-01-28 ENCOUNTER — Ambulatory Visit (HOSPITAL_COMMUNITY)
Admission: RE | Admit: 2024-01-28 | Discharge: 2024-01-28 | Disposition: A | Discharge status: Home / Self Care | Source: Ambulatory Visit

## 2024-01-28 ENCOUNTER — Observation Stay (HOSPITAL_COMMUNITY)

## 2024-01-28 ENCOUNTER — Other Ambulatory Visit: Payer: Self-pay

## 2024-01-28 ENCOUNTER — Encounter (HOSPITAL_COMMUNITY): Payer: Self-pay

## 2024-01-28 DIAGNOSIS — D631 Anemia in chronic kidney disease: Secondary | ICD-10-CM | POA: Insufficient documentation

## 2024-01-28 DIAGNOSIS — M898X9 Other specified disorders of bone, unspecified site: Secondary | ICD-10-CM | POA: Insufficient documentation

## 2024-01-28 DIAGNOSIS — I120 Hypertensive chronic kidney disease with stage 5 chronic kidney disease or end stage renal disease: Secondary | ICD-10-CM | POA: Insufficient documentation

## 2024-01-28 DIAGNOSIS — E7800 Pure hypercholesterolemia, unspecified: Secondary | ICD-10-CM | POA: Insufficient documentation

## 2024-01-28 DIAGNOSIS — R079 Chest pain, unspecified: Principal | ICD-10-CM | POA: Insufficient documentation

## 2024-01-28 DIAGNOSIS — I2510 Atherosclerotic heart disease of native coronary artery without angina pectoris: Secondary | ICD-10-CM | POA: Insufficient documentation

## 2024-01-28 DIAGNOSIS — E1122 Type 2 diabetes mellitus with diabetic chronic kidney disease: Secondary | ICD-10-CM | POA: Insufficient documentation

## 2024-01-28 DIAGNOSIS — N186 End stage renal disease: Secondary | ICD-10-CM | POA: Insufficient documentation

## 2024-01-28 DIAGNOSIS — F419 Anxiety disorder, unspecified: Secondary | ICD-10-CM | POA: Insufficient documentation

## 2024-01-28 DIAGNOSIS — F32A Depression, unspecified: Secondary | ICD-10-CM | POA: Insufficient documentation

## 2024-01-28 DIAGNOSIS — Z7985 Long-term (current) use of injectable non-insulin antidiabetic drugs: Secondary | ICD-10-CM | POA: Insufficient documentation

## 2024-01-28 DIAGNOSIS — N2581 Secondary hyperparathyroidism of renal origin: Secondary | ICD-10-CM | POA: Insufficient documentation

## 2024-01-28 DIAGNOSIS — R9431 Abnormal electrocardiogram [ECG] [EKG]: Secondary | ICD-10-CM | POA: Insufficient documentation

## 2024-01-28 DIAGNOSIS — Z9049 Acquired absence of other specified parts of digestive tract: Secondary | ICD-10-CM

## 2024-01-28 DIAGNOSIS — Z955 Presence of coronary angioplasty implant and graft: Secondary | ICD-10-CM | POA: Insufficient documentation

## 2024-01-28 DIAGNOSIS — Z85528 Personal history of other malignant neoplasm of kidney: Secondary | ICD-10-CM | POA: Insufficient documentation

## 2024-01-28 DIAGNOSIS — I252 Old myocardial infarction: Secondary | ICD-10-CM | POA: Insufficient documentation

## 2024-01-28 DIAGNOSIS — Z7902 Long term (current) use of antithrombotics/antiplatelets: Secondary | ICD-10-CM | POA: Insufficient documentation

## 2024-01-28 DIAGNOSIS — Z79899 Other long term (current) drug therapy: Secondary | ICD-10-CM | POA: Insufficient documentation

## 2024-01-28 DIAGNOSIS — I25118 Atherosclerotic heart disease of native coronary artery with other forms of angina pectoris: Secondary | ICD-10-CM

## 2024-01-28 DIAGNOSIS — E785 Hyperlipidemia, unspecified: Secondary | ICD-10-CM

## 2024-01-28 DIAGNOSIS — Z905 Acquired absence of kidney: Secondary | ICD-10-CM | POA: Insufficient documentation

## 2024-01-28 DIAGNOSIS — Z992 Dependence on renal dialysis: Secondary | ICD-10-CM | POA: Insufficient documentation

## 2024-01-28 HISTORY — DX: Nonrheumatic aortic (valve) stenosis: I35.0

## 2024-01-28 LAB — RED TOP TUBE

## 2024-01-28 LAB — GOLD TOP TUBE

## 2024-01-28 LAB — CBC WITH DIFF
BASOPHIL #: <0.1 10*3/uL (ref <=0.20)
BASOPHIL %: 0.6 %
EOSINOPHIL #: <0.1 10*3/uL (ref <=0.50)
EOSINOPHIL %: 1.6 %
HCT: 34.9 % (ref 34.8–46.0)
HGB: 11.2 g/dL — ABNORMAL LOW (ref 11.5–16.0)
IMMATURE GRANULOCYTE #: <0.1 10*3/uL (ref <0.10)
IMMATURE GRANULOCYTE %: 0.2 % (ref 0.0–1.0)
LYMPHOCYTE #: 1.35 10*3/uL (ref 1.00–4.80)
LYMPHOCYTE %: 27.7 %
MCH: 34.1 pg — ABNORMAL HIGH (ref 26.0–32.0)
MCHC: 32.1 g/dL (ref 31.0–35.5)
MCV: 106.4 fL — ABNORMAL HIGH (ref 78.0–100.0)
MONOCYTE #: 0.4 10*3/uL (ref 0.20–1.10)
MONOCYTE %: 8.2 %
MPV: 10.9 fL (ref 8.7–12.5)
NEUTROPHIL #: 3.01 10*3/uL (ref 1.50–7.70)
NEUTROPHIL %: 61.7 %
PLATELETS: 209 10*3/uL (ref 150–400)
RBC: 3.28 10*6/uL — ABNORMAL LOW (ref 3.85–5.22)
RDW-CV: 14.3 % (ref 11.5–15.5)
WBC: 4.9 10*3/uL (ref 3.7–11.0)

## 2024-01-28 LAB — COMPREHENSIVE METABOLIC PANEL, NON-FASTING
ALBUMIN: 3.5 g/dL (ref 3.5–5.0)
ALKALINE PHOSPHATASE: 61 U/L (ref 50–130)
ALT (SGPT): 23 U/L (ref <31)
ANION GAP: 13 mmol/L (ref 4–13)
AST (SGOT): 22 U/L (ref 11–34)
BILIRUBIN TOTAL: 0.5 mg/dL (ref 0.3–1.3)
BUN/CREA RATIO: 6 (ref 6–22)
BUN: 47 mg/dL — ABNORMAL HIGH (ref 8–25)
CALCIUM: 9.4 mg/dL (ref 8.6–10.2)
CHLORIDE: 99 mmol/L (ref 96–111)
CO2 TOTAL: 29 mmol/L (ref 22–30)
CREATININE: 7.68 mg/dL — ABNORMAL HIGH (ref 0.60–1.05)
ESTIMATED GFR - FEMALE: 6 mL/min/BSA — ABNORMAL LOW (ref >=60)
GLUCOSE: 91 mg/dL (ref 65–125)
POTASSIUM: 5.1 mmol/L (ref 3.5–5.1)
PROTEIN TOTAL: 7.3 g/dL (ref 6.4–8.3)
SODIUM: 141 mmol/L (ref 136–145)

## 2024-01-28 LAB — BLUE TOP TUBE

## 2024-01-28 LAB — LAVENDER TOP TUBE

## 2024-01-28 LAB — LIGHT GREEN TOP TUBE

## 2024-01-28 LAB — TROPONIN-I
TROPONIN-I HS: 13 ng/L (ref <=14.0)
TROPONIN-I HS: 8.6 ng/L (ref <=14.0)

## 2024-01-28 MED ORDER — ATORVASTATIN 40 MG TABLET
40.0000 mg | ORAL_TABLET | Freq: Every evening | ORAL | Status: DC
Start: 2024-01-28 — End: 2024-01-29
  Administered 2024-01-28: 40 mg via ORAL
  Filled 2024-01-28: qty 1

## 2024-01-28 MED ORDER — ESCITALOPRAM 10 MG TABLET
10.0000 mg | ORAL_TABLET | Freq: Every evening | ORAL | Status: DC
Start: 2024-01-28 — End: 2024-01-29
  Administered 2024-01-28: 10 mg via ORAL
  Filled 2024-01-28: qty 1

## 2024-01-28 MED ORDER — FLUTICASONE PROPIONATE 50 MCG/ACTUATION NASAL SPRAY,SUSPENSION
1.0000 | Freq: Every day | NASAL | Status: DC | PRN
Start: 2024-01-28 — End: 2024-01-29

## 2024-01-28 MED ORDER — SODIUM CHLORIDE 0.9 % (FLUSH) INJECTION SYRINGE
10.0000 mL | INJECTION | INTRAMUSCULAR | Status: DC | PRN
Start: 2024-01-28 — End: 2024-01-29

## 2024-01-28 MED ORDER — NITROGLYCERIN 0.4 MG SUBLINGUAL TABLET
0.4000 mg | SUBLINGUAL_TABLET | SUBLINGUAL | Status: DC | PRN
Start: 2024-01-28 — End: 2024-01-28

## 2024-01-28 MED ORDER — SODIUM CHLORIDE 0.9 % (FLUSH) INJECTION SYRINGE
10.0000 mL | INJECTION | Freq: Three times a day (TID) | INTRAMUSCULAR | Status: DC
Start: 2024-01-28 — End: 2024-01-29
  Administered 2024-01-28 (×2): 0 mL via INTRAVENOUS

## 2024-01-28 MED ORDER — SEVELAMER CARBONATE 800 MG TABLET
1600.0000 mg | ORAL_TABLET | Freq: Three times a day (TID) | ORAL | Status: DC
Start: 2024-01-28 — End: 2024-01-29
  Administered 2024-01-28: 1600 mg via ORAL
  Administered 2024-01-28 (×2): 0 mg via ORAL
  Administered 2024-01-29: 1600 mg via ORAL
  Filled 2024-01-28 (×2): qty 2

## 2024-01-28 MED ORDER — LORATADINE 10 MG TABLET
10.0000 mg | ORAL_TABLET | Freq: Every day | ORAL | Status: DC | PRN
Start: 2024-01-28 — End: 2024-01-29

## 2024-01-28 MED ORDER — TICAGRELOR 90 MG TABLET
90.0000 mg | ORAL_TABLET | Freq: Two times a day (BID) | ORAL | Status: DC
Start: 2024-01-28 — End: 2024-01-29
  Administered 2024-01-28: 90 mg via ORAL
  Administered 2024-01-28: 0 mg via ORAL
  Administered 2024-01-29: 90 mg via ORAL
  Filled 2024-01-28 (×2): qty 1

## 2024-01-28 MED ORDER — ALPRAZOLAM 0.5 MG TABLET
0.2500 mg | ORAL_TABLET | Freq: Every day | ORAL | Status: DC | PRN
Start: 2024-01-28 — End: 2024-01-29

## 2024-01-28 MED ORDER — NITROGLYCERIN 0.4 MG SUBLINGUAL TABLET
0.4000 mg | SUBLINGUAL_TABLET | SUBLINGUAL | Status: DC | PRN
Start: 2024-01-28 — End: 2024-01-29

## 2024-01-28 MED ORDER — HEPARIN (PORCINE) 5,000 UNIT/ML INJECTION SOLUTION
5000.0000 [IU] | Freq: Three times a day (TID) | INTRAMUSCULAR | Status: DC
Start: 2024-01-28 — End: 2024-01-29
  Administered 2024-01-28 – 2024-01-29 (×3): 0 [IU] via SUBCUTANEOUS
  Filled 2024-01-28: qty 1

## 2024-01-28 MED ORDER — ASPIRIN 81 MG CHEWABLE TABLET
81.0000 mg | CHEWABLE_TABLET | Freq: Every day | ORAL | Status: DC
Start: 2024-01-28 — End: 2024-01-29
  Administered 2024-01-28: 0 mg via ORAL
  Administered 2024-01-29: 81 mg via ORAL
  Filled 2024-01-28 (×2): qty 1

## 2024-01-28 MED ORDER — SODIUM CHLORIDE 0.9% FLUSH BAG - 250 ML
INTRAVENOUS | Status: DC | PRN
Start: 2024-01-28 — End: 2024-01-29

## 2024-01-28 MED ORDER — SODIUM CHLORIDE 0.9 % (FLUSH) INJECTION SYRINGE
10.0000 mL | INJECTION | Freq: Three times a day (TID) | INTRAMUSCULAR | Status: DC
Start: 2024-01-28 — End: 2024-01-29
  Administered 2024-01-28: 10 mL via INTRAVENOUS
  Administered 2024-01-28 – 2024-01-29 (×2): 0 mL via INTRAVENOUS

## 2024-01-28 MED ORDER — CYCLOBENZAPRINE 10 MG TABLET
5.0000 mg | ORAL_TABLET | Freq: Three times a day (TID) | ORAL | Status: DC | PRN
Start: 2024-01-28 — End: 2024-01-29

## 2024-01-28 MED ORDER — DEXTROSE 5% IN WATER (D5W) FLUSH BAG - 250 ML
INTRAVENOUS | Status: DC | PRN
Start: 2024-01-28 — End: 2024-01-29

## 2024-01-28 MED ORDER — ASPIRIN 81 MG CHEWABLE TABLET
324.0000 mg | CHEWABLE_TABLET | ORAL | Status: DC
Start: 2024-01-28 — End: 2024-01-28

## 2024-01-28 MED ORDER — PROMETHAZINE-DM 6.25 MG-15 MG/5 ML ORAL SYRUP
1.2500 mL | ORAL_SOLUTION | Freq: Four times a day (QID) | ORAL | Status: DC | PRN
Start: 2024-01-28 — End: 2024-01-29
  Administered 2024-01-28: 1.25 mL via ORAL
  Filled 2024-01-28 (×3): qty 5

## 2024-01-28 MED ORDER — CARVEDILOL 3.125 MG TABLET
3.1250 mg | ORAL_TABLET | Freq: Two times a day (BID) | ORAL | Status: DC
Start: 2024-01-28 — End: 2024-01-29
  Administered 2024-01-28: 0 mg via ORAL
  Administered 2024-01-29: 3.125 mg via ORAL
  Filled 2024-01-28 (×2): qty 1

## 2024-01-28 NOTE — H&P (Addendum)
 [facility]  [place], [place] [address]    General History and Physical    [name], [name]  Date of Admission:  01/28/2024  Date of Birth:  02-12-1966    PCP: Curvin Downing, NP  Chief Complaint:    Intermittent squeezing type of midsternal chest pain since the morning of presentation      HPI: [name] is a 58 y.o., White female female with CAD with a history of stent, ESRD on HD, type 2 DM anxiety/and anxiety/depression presented with squeezing type midsternal chest pain associated with left arm numbness, shortness of breaths and nausea.  The pain occurs at rest, which is intermittent and presumed to be severe.  Denied fever, cough, chills, palpitation,  In ED BP is 149/71 HR 86 RR 16 saturating 100% on room air  CBC is largely unremarkable except hemoglobin of 11.2 chemistry is notable for elevated creatinine with expected in ESRD patient  Troponins negative x1, EKG normal sinus rhythm and T-wave inversion which is unchanged from previous ECG  admitted for chest pain workup    Active Hospital Problems   (*Primary Problem)    Diagnosis    *Chest pain       Past Medical History:   Diagnosis Date    Anxiety     Arthritis     hips    Beta blocker prescribed for left ventricular systolic dysfunction     Labetalol      Cancer (CMS HCC) 2019, 2021    kidney, 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    Constipation     COVID 07/2019    Depression     Diabetes mellitus     Diabetes mellitus, type 2     Diarrhea     Dyspnea on exertion     ESRD (end stage renal disease) (CMS HCC)     Essential hypertension     Exercise intolerance     Fatty liver     Fluid overload 08/21/21    Recent  hospitalization for fluid overload, HD initiated 10/18/20    Headache(784.0)     Hemodialysis patient (CMS HCC)     HD Monday-Wednesday-Friday    History of kidney disease     CKD-Stage 5-HD initiated 10/18/20    History of nephrectomy 03/24/2020    L nephrectomy    Hx of echocardiogram 10/18/2020    Technically difficult d/t pt. body habitus; Left vent. EF est. 55-60%; mildy calcified aortic vlave leaflets-especially right coroanry cusp; Small mobile density on aortic valve-no aortic valve stenosis or regurg; Concentric remodeling; mild MR; Trace AR; Trileaflet aortic vlave; Nl IVC size wiht <50% insp. collapse;       Hyperlipidemia     Hyperparathyroidism (CMS HCC)     Hyperthyroidism     Old records states hyperthyroidism, patient denies, states has hyperparathyroidism    Irritable bowel syndrome     Macular edema     Morbid obesity (CMS HCC)     Obesity     Panic attack     Peripheral edema     ankles/feet    Peripheral neuropathy  bilateral feet    Pneumonia 07/2019; 09/2020    COVID Pneumonia 07/2019; fluid overload/pneumonia with recent hosp. 09/2020    Shortness of breath     SOB after climbing 8 steps     Type 2 diabetes mellitus     Wears glasses     reading    White coat hypertension            Past Surgical History:   Procedure Laterality Date    AV FISTULA PLACEMENT Left 08/11/2020    LUE brachio cephalic fistula creation    ECTOPIC PREGNANCY SURGERY  05/24/1998    HX CATARACT REMOVAL Bilateral 2016    HX CHOLECYSTECTOMY  09/10/96    HX HAND SURGERY Right 2017    Trigger finger release 3rd finger    HX OTHER Right 10/18/2020    tunneled dialysis catheter    HX PELVIC LAPAROSCOPY  05/24/98    Ectopic pregnancy    HX SHOULDER SURGERY Left 2017    Procedure for frozen shoulder    HX TUBAL LIGATION  09/10/2012    Filshie Clips    HYMENECTOMY  1996    KIDNEY SURGERY  10/24/17; 03/24/20    LAPAROSCOPIC NEPHRECTOMY, HAND ASSISTED Left 03/24/2020    Radical    LAPAROSCOPIC PARTIAL NEPHRECTOMY Left 10/24/2017     hand-assisted    RENAL BIOPSY, PERCUTANEOUS  03/08/2020    clear cell renal cell carcinoma           Medications Prior to Admission       Prescriptions    albuterol  sulfate (PROVENTIL  OR VENTOLIN  OR PROAIR ) 90 mcg/actuation Inhalation oral inhaler    Take 1-2 Puffs by inhalation Every 6 hours as needed    ALPRAZolam  (XANAX ) 0.25 mg Oral Tablet    Take 1 Tablet (0.25 mg total) by mouth Once per day as needed for Anxiety    aspirin  81 mg Oral Tablet, Chewable    Chew 1 Tablet (81 mg total) Once a day    atorvastatin  (LIPITOR) 40 mg Oral Tablet    Take 1 Tablet (40 mg total) by mouth Every evening    carvediloL  (COREG ) 3.125 mg Oral Tablet    Take 1 Tablet (3.125 mg total) by mouth Twice daily with food    cyclobenzaprine  (FLEXERIL ) 5 mg Oral Tablet    Take 1 Tablet (5 mg total) by mouth Three times a day as needed for Muscle spasms    escitalopram  oxalate (LEXAPRO ) 10 mg Oral Tablet    Take 1 Tablet (10 mg total) by mouth Every night    Fish Oil -Omega-3 Fatty Acids  360-1,200 mg Oral Capsule    Take 2 Capsules by mouth Twice daily    fluticasone  (FLONASE ) 50 mcg/actuation Nasal Spray, Suspension    Administer 1 Spray into each nostril Once per day as needed (Allergy symptoms (nasal congestion/ runny nose))    iron sucrose (VENOFER) 50 mg iron/2.5 mL Intravenous Solution    2.5 mL (50 mg total)    loperamide  (IMODIUM ) 2 mg Oral Capsule    Take 2 Capsules (4 mg total) by mouth Every 6 hours as needed (Diarrhea)    loratadine  (CLARITIN ) 10 mg Oral Tablet    Take 1 Tablet (10 mg total) by mouth Once per day as needed (Allergies)    losartan  (COZAAR ) 25 mg Oral Tablet    Take 1 Tablet (25 mg total) by mouth Once a day for 30 days    Patient taking differently:  Take 1 Tablet (25 mg  total) by mouth Take in the evening on non-dialysis days (Sun/Tues/Thurs/Sat); does not take on dialysis days    MOUNJARO 7.5 mg/0.5 mL Subcutaneous Pen Injector    Inject 0.5 mL (7.5 mg total) under the skin Every 7 days    nitroGLYCERIN   (NITROSTAT ) 0.4 mg Sublingual Tablet, Sublingual    Place 1 Tablet (0.4 mg total) under the tongue Every 5 minutes as needed for Chest pain for up to 10 doses for 3 doses over 15 minutes    omega-3-DHA-EPA-fish oil  (FISH OIL ) 1,000 mg (120 mg-180 mg) Oral Capsule    Take by mouth    promethazine -dextromethorphan (PHENERGAN -DM) 6.25-15 mg/5 mL Oral Syrup    TAKE 5 ML BY MOUTH 4 TIMES DAILY AS NEEDED FOR COUGH    sevelamer  carbonate (RENVELA ) 800 mg Oral Tablet    Take 2 Tablets (1,600 mg total) by mouth    ticagrelor  (BRILINTA ) 90 mg Oral Tablet    Take 1 Tablet (90 mg total) by mouth Twice daily    traMADoL  (ULTRAM ) 50 mg Oral Tablet    Take 1 Tablet (50 mg total) by mouth Every 6 hours as needed for Pain    TRIPHROCAPS 1 mg Oral Capsule    Take 1 Capsule by mouth Every night    vitamin E 100 unit Oral Capsule    Take 1 Capsule (100 Units total) by mouth Twice daily          ALPRAZolam  (XANAX ) tablet, 0.25 mg, Oral, Daily PRN  aspirin  chewable tablet 81 mg, 81 mg, Oral, Daily  atorvastatin  (LIPITOR) tablet, 40 mg, Oral, QPM  carvedilol  (COREG ) tablet, 3.125 mg, Oral, 2x/day-Food  cyclobenzaprine  (FLEXERIL ) tablet, 5 mg, Oral, 3x/day PRN  D5W 250 mL flush bag, , Intravenous, Q15 Min PRN  escitalopram  (LEXAPRO ) tablet, 10 mg, Oral, NIGHTLY  fluticasone  (FLONASE ) 50 mcg per spray nasal spray, 1 Spray, Each Nostril, Daily PRN  heparin  5,000 unit/mL injection, 5,000 Units, Subcutaneous, Q8HRS  loratadine  (CLARITIN ) tablet, 10 mg, Oral, Daily PRN  nitroGLYCERIN  (NITROSTAT ) sublingual tablet, 0.4 mg, Sublingual, Q5 Min PRN  NS 250 mL flush bag, , Intravenous, Q15 Min PRN  NS flush syringe, 10 mL, Intravenous, Q8H  NS flush syringe, 10 mL, Intravenous, Q8HRS  NS flush syringe, 10 mL, Intravenous, Q1H PRN  promethazine -dextromethorphan (PHENERGAN -DM) 6.25-15 mg per 5 mL oral liquid, 1.25 mL, Oral, Q6H PRN  sevelamer  carbonate (RENVELA ) tablet, 1,600 mg, Oral, 3x/day-Meals  ticagrelor  (BRILINTA ) tablet, 90 mg, Oral,  2x/day        Allergies   Allergen Reactions    Gabapentin Itching    Hydrocodone      Ibuprofen      Vicodin [Hydrocodone -Acetaminophen ] Itching       Social History     Tobacco Use    Smoking status: Never    Smokeless tobacco: Never   Substance Use Topics    Alcohol use: No       Family Medical History:       Problem Relation (Age of Onset)    Breast Cancer Paternal Aunt    Cancer Maternal Grandmother    Diabetes Father, Paternal Aunt    High Cholesterol Maternal Uncle, Mother    Hypertension (High Blood Pressure) Maternal Uncle, Father              ROS: Other than ROS in the HPI, all other systems were negative.    DNR Status:  FULL CODE: ATTEMPT RESUSCITATION/CPR    EXAM:  Temperature: 36.5 C (97.7 F)  Heart Rate: 83  BP (Non-Invasive): (!) 149/71  Respiratory Rate: 16  SpO2: 100 %  General: appears in good health. No distress.   Eyes: Pupils equal and round, reactive to light and accomodation.   HEENT: Head atraumatic and normocephalic   Neck: No JVD or thyromegaly or lymphadenopathy   Lungs: Clear to auscultation bilaterally.   Cardiovascular: regular rate and rhythm, S1, S2 normal, no murmur  Abdomen: Soft, non-tender, Bowel sounds normal, No hepatosplenomegaly   Extremities: extremities normal, atraumatic, no cyanosis or edema   reproducible tenderness on left 2nd rib but costochondral junction  Skin: Skin warm and dry   Neurologic: Grossly normal   Lymphatics: No lymphadenopathy   Psychiatric: Normal affect, behavior,         Labs:    I have reviewed all lab results.  CBC with Diff (Last 24 Hours):    Recent Results last 24 hours     01/28/24  1006   WBC 4.9   HGB 11.2*   HCT 34.9   MCV 106.4*   PLTCNT 209   PMNS 61.7   LYMPHO 27.7   MONOCYTES 8.2   EOSINO 1.6   BASOPHILS 0.6  <0.10     Lab Results   Component Value Date    SODIUM 141 01/28/2024    POTASSIUM 5.1 01/28/2024    CHLORIDE 99 01/28/2024    CO2 29 01/28/2024    ANIONGAP 13 01/28/2024    BUN 47 (H) 01/28/2024    CREATININE 7.68 (H) 01/28/2024     BUNCRRATIO 6 01/28/2024    GFR 6 (L) 01/28/2024    GLUCOSENF 276 (H) 08/22/2019         Imaging Studies:  Chest x-ray   IMPRESSION:  No acute process.    Assessment/Plan:     Chest pain  Can not out acute coronary syndrome setting of significant coronary arterial disease with a stent  Initial troponins negative, we will trend it  EKGs unremarkable  Obtain echocardiogram  Resume aspirin  , Brilinta , carvedilol  and nitro  History of MI/CAD, placement of DES in 04/23/2023, continue aspirin , Brilinta , carvedilol , nitro  Hyperlipidemia, resume atorvastatin  40 mg daily  Type 2 DM, last A1c 6.7, the patient takes Mounjaro weekly injection, sliding scale, diabetic diet, monitor FS,  ESRD on HD, MWF schedule, consulted nephrology for resumption of hemodialysis  Secondary hyperparathyroidism, resume sevelamer  for phosphate binder  Anemia of ESRD, 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  Heparin   Code status. Full code.  Spoke to ED physician Dr. Aaron Aas We decided to admit the patient.      Portions of this note may be dictated using voice recognition software or a dictation service. Variances in spelling and vocabulary are possible and unintentional. Not all errors are caught/corrected. Please notify the [facility] if any discrepancies are noted or if the meaning of any statement is not clear.

## 2024-01-28 NOTE — Nurses Notes (Signed)
 "HEMODIALYSIS SUMMARY"     PATIENT: [name] MRN: [id number] ROOM: 606/A    POST TX NOTES: Hemodialysis treatment completed, 2000 cc net removed. No issues with patient's AVG. Blood safely returned.            T A R G E T     FINAL  RESULTS   NET UF        2000       _ML   BVP         70.0      _L   KT/V 0.87       ORDERS:  Order  HEMODIALYSIS [DIACHI1] (Order [id number])  [name], [name]  Order #: [id number] Accession #: MRN: [id number]  General Information    No case/log ID found     Order Providers    Authorizing   Baxter Bott, MD            Order Information    Date Department Ordering/Authorizing   01/28/2024 [facility] 41F Baxter Bott, MD       Start Date/Time    Start Time   01/28/24 1400           Acknowledgement Info    For At Acknowledged By Acknowledged On   Placing Order 01/28/24 1402 Vorous, Destinee, RN 01/28/24 1621         Released/Completed Orders    Released On Released By Completed On Completed By   01/28/24 1402 Baxter Bott, MD (auto-released)                       Standing Order Information    Remaining Occurrences Interval Last Released     0/1 ONE TIME 01/28/2024                  Released Orders      Released On Order # Scheduled For Released By   1. 01/28/2024  2:02 PM [id number] 01/28/2024  2:00 PM Baxter Bott, MD (auto-released)                    Order Questions    Question Answer   Reason for Treatment: End stage renal disease(CKD-Stage6)   Length of Dialysis (hrs): 3.25   If unable to maintain flow due to inadequate vascular access patency, patient intoleralance, or elevated venous pressure, adjust blood flow rate between: 350   Target Blood Flow Rate (ml/min): 400   Size of fistula needle: 15 gauge - 1 inch   Dialyzer: Nipro 17H   Fluid Removal (kg): 2.5   Ultrafiltration Goal (L): 2.5   if unable to maintain this UFR due to patient intolerance (i.e. hypotension, chest pain,  muscle cramping, nausea or vomiting), adjust UFR to achieve this range in Liters : 2   Dialysate flow rate (ml/min): 700   Acid Bath: 2 K / 2.5 Ca   Bicarb Settings: 38   Sodium Type: Step   Sodium: 137   Calcium : 2.5   Type of access: Fistula   UF Profile: Step   Dialysate temperature: 36 degrees Celsius                    Additional Information    Associated Reports   View Encounter   Priority and Order Details         HEMODIALYSIS    Electronically signed by: Baxter Bott, MD on 01/28/24 1402   Ordering user: Baxter Bott, MD 01/28/24 1402 Ordering provider:  Baxter Bott, MD   Authorized by: Baxter Bott, MD Ordering mode: Standard     Authorizing Provider NPI information    Baxter Bott, MD                [phone]                      Patient Release Status:    This result is automatically blocked from the patient.               [facility] Result Comments     Not Released  Not seen          Order Transmittal Tracking/Results Routing Info    Order Transmittal Tracking/Results Routing Info       Order Set Info    Order Set Source   Did not come from Order set      Reprint Order    HEMODIALYSIS (Order [phone]) on 01/28/24        LABS:   Latest Reference Range & Units 01/28/24 10:06   WBC 3.7 - 11.0 x10^3/uL 4.9   HGB 11.5 - 16.0 g/dL 56.2 (L)   HCT 13.0 - 86.5 % 34.9   PLATELET COUNT 150 - 400 x10^3/uL 209   RBC 3.85 - 5.22 x10^6/uL 3.28 (L)   MCV 78.0 - 100.0 fL 106.4 (H)   MCHC 31.0 - 35.5 g/dL 78.4   MCH 69.6 - 29.5 pg 34.1 (H)   RDW-CV 11.5 - 15.5 % 14.3   MPV 8.7 - 12.5 fL 10.9   PMN'S % 61.7   LYMPHOCYTES % 27.7   EOSINOPHIL % 1.6   MONOCYTES % 8.2   BASOPHILS % 0.6   IMMATURE GRANULOCYTE % 0.0 - 1.0 % 0.2   IMMATURE GRANULOCYTE # <0.10 x10^3/uL <0.10   PMN ABS 1.50 - 7.70 x10^3/uL 3.01   LYMPHS ABS 1.00 - 4.80 x10^3/uL 1.35   EOS ABS <=0.50 x10^3/uL <0.10   MONOS ABS 0.20 - 1.10 x10^3/uL 0.40   BASOS ABS <=0.20 x10^3/uL <0.10   SODIUM 136 - 145 mmol/L 141   POTASSIUM 3.5 - 5.1 mmol/L 5.1   CHLORIDE  96 - 111 mmol/L 99   CARBON DIOXIDE 22 - 30 mmol/L 29   BUN 8 - 25 mg/dL 47 (H)   CREATININE 2.84 - 1.05 mg/dL 1.32 (H)   GLUCOSE 65 - 125 mg/dL 91   ANION GAP 4 - 13 mmol/L 13   BUN/CREAT RATIO 6 - 22  6   ESTIMATED GFR - FEMALE >=60 mL/min/BSA 6 (L)   CALCIUM  8.6 - 10.2 mg/dL 9.4   TOTAL PROTEIN 6.4 - 8.3 g/dL 7.3   ALBUMIN 3.5 - 5.0 g/dL  3.5   BILIRUBIN, TOTAL 0.3 - 1.3 mg/dL 0.5   AST (SGOT) 11 - 34 U/L 22   ALT (SGPT) <31 U/L 23   ALKALINE PHOSPHATASE 50 - 130 U/L 61   TROPONIN-I HS <=14.0 ng/L ng/L 13.0   (L): Data is abnormally low  (H): Data is abnormally high    CONSENT SIGNED: Yes  BLOOD CONSENT:      N/A:  DX:   Patient Active Problem List   Diagnosis    Diabetic foot infection (CMS HCC)  (CMS HCC)    Type 2 diabetes mellitus    Cellulitis of foot, left    Uncontrolled hypertension    Obesity    C. 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)    NSTEMI (non-ST elevated myocardial infarction) (CMS HCC)    Hypercholesterolemia    Chest pain     DIABETIC: Yes  CODE STATUS: FULL CODE: ATTEMPT RESUSCITATION/CPR  ALLERGIES:  Allergies   Allergen Reactions    Gabapentin Itching    Hydrocodone      Ibuprofen      Vicodin [Hydrocodone -Acetaminophen ] Itching     DIET: DIET REGULAR  ACCESS:  Lt UA AVG    NEEDLE SIZE(G): 15  WT(KG): 111      EDW:   /  UFG(L): 2-2.5L  PRE TX NURSE REPORT FROM/TIME: Vorous Destinee RN at 1415  TIME OUT TIME/SAFETY CHECKS:      Sherlynn Ditty RN at 1528  HEPATITIS STATUS: Immune  HBSAG   DATE:   01/14/2024    RESULTS:  Neg  HBSAB   DATE:   04/27/2023     RESULTS: 78  MOBILITY: BED  Yes STRETCHER  1ST TIME ACUTE:      STAT:     ROUTINE:  Yes    URGENT:  ACUTE ROOM: Yes     BEDSIDE:      ICU:  ISOLATION PRECAUTION:  DIALYSIS:   Yes   AIRBORNE:     CONTACT:      REVERSE:      DROPLET:  SPECIAL CONSIDERATIONS: None  TOTAL CHLORINE < 0.1PPM  TIME:    1115     2ND CHECK TIME:  1515  TREATMENT INITIATION: 1530  BATH K/CA:    2.0K 2.5CA     BFR/DFR:  350-400/7000    NA/HCO3: 137/38  ISOLATED UF ONLY:  No  MACHINE S/N: 3  RO MAIN:  Yes   PORTABLE S/N:  ALARM TEST PASS TIME: 1519      BLEACH RESIDUAL NEGATIVE:   Y   MINNICARE RESIDUAL NEGATIVE:  Y   RO/MACHINE LOG COMPLETED:  Y   EXTRACORPOREAL CIRCUIT TESTED FOR INTEGRITY:  Y   ALL CONNECTIONS SECURED:  Y   SALINE LINE DOUBLE CLAMPED:  Y   VENOUS/ARTERIAL PARAMETERS SET:  Y   PRIME GIVEN: 250 MLS  Y   AIR FOAM DETECTOR ENGAGED:  Y   INCAPACITATED NURSE EDUCATION COMPLETED:  Y   MD NOTIFIED AT THE START:  Baxter Bott MD          F Karlynn Oyster E E T S:     01/28/24 1445   Machine Checks   Safety Checks P   Machine Number 3   Machine Temp 36 C (96.8 F)   Alarms Set Yes (Pre-Set)   Conductivity 13.6   Bleach -   Independent pH 7.4   Dialysate Used: premixed hemodialysate (NATURALYTE) 3.43 L;with calcium  chloride 2.5 mEq/L;with potassium chloride  2 mEq/L   Dialyzer Used: Nipro 17H   Med Being Verified Dialysate   Med Verification (1st) Clinician Verification of Medication/Blood;Second Clinician Verification of Medication/Blood   Additional Pre- Dialysis Documentation   Hepatitis B Status Checked: Negative Surface Antigen;Immunity   Hepatitis B Date Drawn 04/27/23   Pre-Dialysis Safety Checks Yes   Total Chlorine Less than 0.1   Dialysis Prescription  Order Confirmed Yes   Pre Dialysis Vitals   Weight (Pre Dialysis) 111 kg (244 lb 11.4 oz)   Weight Source Bed   Temp 36.5 C (97.7 F)   BP (Non-Invasive) (!) 157/72   BP Source (Non-Invasive) RA   Heart Rate 77   KIDNEY DISEASE EDUCATION (KDE)    KIDNEY DISEASE EDUCATION (KDE) Session 1   SESSION DATE 01/28/24   SESSION TYPE G0420-Individual > 30 minutes   Pre Dialysis Assessment   Patient Status Arrived;Report  Received;Stable   Cardiac Pulse Regular   Respiratory Dyspnea On Exertion   Mental Status Alert   GI Complaints No Complaint   Fluid Assessment Jugular Distention;Lower Extremity Edema   Lower Extremity Edema Status Mild   Pain Score 0   Patient Report Received From: Vorous Destinee RN at 1415   Vascular Access   Was Catheter Placed in Dialysis? No   Access Site Left Arm;Upper   Access Type AV Graft   Access Assessment WDL   Needle Size 15 gauge   Access Patent Yes   Dialysis Consent   Consent Obtained Written   Date Consent Obtained 04/23/23   Procedure Time Out   Consent Obtained Verbal   Patient Identification (name and date of birth): Patient Verbally Identified Self;Verified Patient Identification Band   Patient Position Supine   Procedure Verified? Yes   Site Verified? Y   Final Verification and Time Out Correct Patient Identified;Correct Position;Correct Site and Side Mark Visible or Acceptable Alternative;Accurate Procedure Consent Form ;Relevant Images and Results Properly Labeled and Displayed;Availability of Implants, Devices, Special Equipment, and/or Special Requirements;Safety Precautions Based on Patient History and Medication Use;PO Status Reviewed;Allergies Reviewed;Pertinent History Reviewed   Time Out Performed At: 1528   Time Out Performed By Sherlynn Ditty RN at [phone]   Start Time of Procedure 1530  (Treatment initiated.)      01/28/24 1530 01/28/24 1545 01/28/24 1600   Hemodialysis Monitor   Patient Status Stable Stable Stable   Time On 1530  (Treatment initiated.)  --   --    Vitals and Machine Parameters   BP 159/77 155/80 147/78   Pulse 75 76 76   Kt/V  --   --   --    Dialysate Rate mL/Min 700 mL/Min 700 mL/Min  --    Blood Flow Rate mL/Min 350 mL/Min 350 mL/Min  --    Arterial Pressure -120 mmHg -120 mmHg  --    Venous Pressure 180 mmHg 190 mmHg  --    Transmembrane Pressure mmHg 60 mmHg 60 mmHg  --    Ultrafiltration Rate (Pt. Fluid Removal) ml/hr 770 770  --    Fluids Normal Saline  --    --    IV Fluid Administered Due To:   (Priming)  --   --    Fluid Volume 250 mL  --   --    Fluid Goal 2 Kg 2 Kg  --    Actual Fluid Goal 2.5 Kg 2.5 Kg  --    Actual Fluid Removal 0 mL 300 mL  --    Treatment Total Fluid Removal 0 mL  --   --    Patient Observed Yes Yes Yes   Additional Hemodialysis Documentation   Dialysis Access Visible Yes Yes Yes   Hemodialysis Line Secure Yes Yes Yes   Machine Serial Number 3  --   --    RO Serial Number Main  --   --    Alarm Pass Time  1519  --   --    Dialyzer Lot# [id number]  --   --    Tubing Lot# [id number]  --   --    RO/Machine Log Complete Yes  --   --    All Connections Secured Yes  --   --    Venous/Arterial parameters set Yes  --   --    Prime Given 250  --   --    Air Foam detector engages  Yes  --   --    Saline Line double clamped  Yes  --   --    Incapacitated Nurse Education Completed  Yes  --   --    Post Dialysis Vitals   Weight (Post Dialysis)  --   --   --    Weight Source  --   --   --    Temp  --   --   --    Temp Source  --   --   --    BP (Non-Invasive)  --   --   --    BP Source (Non-Invasive)  --   --   --    Heart Rate  --   --   --    RR  --   --   --    End Treatment Kt/V  --   --   --    Post Dialysis Assessment   Time Off  --   --   --    $ Treatment Completed  --   --   --    Cardiac  --   --   --    Respiratory  --   --   --    Post Vascular Assessment  --   --   --    Dialyzer Appearance  --   --   --    Dialysis Outcome  --   --   --    Post Hemodialysis Patient Status  --   --   --       01/28/24 1615 01/28/24 1630 01/28/24 1645   Hemodialysis Monitor   Patient Status Stable Stable Stable   Time On  --   --   --    Vitals and Machine Parameters   BP 140/76 140/66 131/78   Pulse 80 75 73   Kt/V  --   --   --    Dialysate Rate mL/Min 700 mL/Min  --  700 mL/Min   Blood Flow Rate mL/Min 350 mL/Min  --  350 mL/Min   Arterial Pressure -130 mmHg  --  -130 mmHg   Venous Pressure 120 mmHg  --  150 mmHg   Transmembrane Pressure mmHg 60  mmHg  --  60 mmHg   Ultrafiltration Rate (Pt. Fluid Removal) ml/hr 770  --  770   Fluids  --   --   --    IV Fluid Administered Due To:  --   --   --    Fluid Volume  --   --   --    Fluid Goal 2 Kg  --  2 Kg   Actual Fluid Goal 2.5 Kg  --  2.5 Kg   Actual Fluid Removal 420 mL  --  980 mL   Treatment Total Fluid Removal  --   --   --    Patient Observed Yes Yes Yes   Additional Hemodialysis Documentation   Dialysis Access Visible Yes Yes  Yes   Hemodialysis Line Secure Yes Yes Yes   Machine Serial Number  --   --   --    RO Serial Number  --   --   --    Alarm Pass Time  --   --   --    Dialyzer Lot#  --   --   --    Tubing Lot#  --   --   --    RO/Machine Log Complete  --   --   --    All Connections Secured  --   --   --    Venous/Arterial parameters set  --   --   --    Prime Given  --   --   --    IT consultant engages   --   --   --    Saline Line double clamped   --   --   --    Incapacitated Nurse Education Completed   --   --   --    Post Dialysis Vitals   Weight (Post Dialysis)  --   --   --    Weight Source  --   --   --    Temp  --   --   --    Temp Source  --   --   --    BP (Non-Invasive)  --   --   --    BP Source (Non-Invasive)  --   --   --    Heart Rate  --   --   --    RR  --   --   --    End Treatment Kt/V  --   --   --    Post Dialysis Assessment   Time Off  --   --   --    $ Treatment Completed  --   --   --    Cardiac  --   --   --    Respiratory  --   --   --    Post Vascular Assessment  --   --   --    Dialyzer Appearance  --   --   --    Dialysis Outcome  --   --   --    Post Hemodialysis Patient Status  --   --   --       01/28/24 1700 01/28/24 1715 01/28/24 1730   Hemodialysis Monitor   Patient Status Stable Stable Stable   Time On  --   --   --    Vitals and Machine Parameters   BP 132/70 115/59 111/58   Pulse 73 75 71   Kt/V  --   --   --    Dialysate Rate mL/Min  --  700 mL/Min  --    Blood Flow Rate mL/Min  --  350 mL/Min  --    Arterial Pressure  --  -130 mmHg  --    Venous  Pressure  --  190 mmHg  --    Transmembrane Pressure mmHg  --  60 mmHg  --    Ultrafiltration Rate (Pt. Fluid Removal) ml/hr  --  770  --    Fluids  --   --   --    IV Fluid Administered Due To:  --   --   --    Fluid Volume  --   --   --  Fluid Goal  --  2 Kg  --    Actual Fluid Goal  --  2.5 Kg  --    Actual Fluid Removal  --  1360 mL  --    Treatment Total Fluid Removal  --   --   --    Patient Observed Yes Yes Yes   Additional Hemodialysis Documentation   Dialysis Access Visible Yes Yes Yes   Hemodialysis Line Secure Yes Yes Yes   Machine Serial Number  --   --   --    RO Serial Number  --   --   --    Alarm Pass Time  --   --   --    Dialyzer Lot#  --   --   --    Tubing Lot#  --   --   --    RO/Machine Log Complete  --   --   --    All Connections Secured  --   --   --    Venous/Arterial parameters set  --   --   --    Prime Given  --   --   --    IT consultant engages   --   --   --    Saline Line double clamped   --   --   --    Incapacitated Nurse Education Completed   --   --   --    Post Dialysis Vitals   Weight (Post Dialysis)  --   --   --    Weight Source  --   --   --    Temp  --   --   --    Temp Source  --   --   --    BP (Non-Invasive)  --   --   --    BP Source (Non-Invasive)  --   --   --    Heart Rate  --   --   --    RR  --   --   --    End Treatment Kt/V  --   --   --    Post Dialysis Assessment   Time Off  --   --   --    $ Treatment Completed  --   --   --    Cardiac  --   --   --    Respiratory  --   --   --    Post Vascular Assessment  --   --   --    Dialyzer Appearance  --   --   --    Dialysis Outcome  --   --   --    Post Hemodialysis Patient Status  --   --   --       01/28/24 1745 01/28/24 1800 01/28/24 1815   Hemodialysis Monitor   Patient Status Stable Other (Comment)  (C/o of cramping in Rt LE's UF paused.) Stable   Time On  --   --   --    Vitals and Machine Parameters   BP 109/53 116/57 115/61   Pulse 73 78 76   Kt/V  --   --   --    Dialysate Rate mL/Min 700 mL/Min  --   700 mL/Min   Blood Flow Rate mL/Min 350 mL/Min  --  350 mL/Min   Arterial Pressure -140 mmHg  --  -140 mmHg   Venous Pressure  180 mmHg  --  180 mmHg   Transmembrane Pressure mmHg 60 mmHg  --  60 mmHg   Ultrafiltration Rate (Pt. Fluid Removal) ml/hr 770  --  770   Fluids  --   --   --    IV Fluid Administered Due To:  --   --   --    Fluid Volume  --   --   --    Fluid Goal 2 Kg  --  2 Kg   Actual Fluid Goal 2.5 Kg  --  2.5 Kg   Actual Fluid Removal 1820 mL  --  2200 mL   Treatment Total Fluid Removal  --   --   --    Patient Observed Yes Yes Yes   Additional Hemodialysis Documentation   Dialysis Access Visible Yes Yes Yes   Hemodialysis Line Secure Yes Yes Yes   Machine Serial Number  --   --   --    RO Serial Number  --   --   --    Alarm Pass Time  --   --   --    Dialyzer Lot#  --   --   --    Tubing Lot#  --   --   --    RO/Machine Log Complete  --   --   --    All Connections Secured  --   --   --    Venous/Arterial parameters set  --   --   --    Prime Given  --   --   --    IT consultant engages   --   --   --    Saline Line double clamped   --   --   --    Incapacitated Nurse Education Completed   --   --   --    Post Dialysis Vitals   Weight (Post Dialysis)  --   --   --    Weight Source  --   --   --    Temp  --   --   --    Temp Source  --   --   --    BP (Non-Invasive)  --   --   --    BP Source (Non-Invasive)  --   --   --    Heart Rate  --   --   --    RR  --   --   --    End Treatment Kt/V  --   --   --    Post Dialysis Assessment   Time Off  --   --   --    $ Treatment Completed  --   --   --    Cardiac  --   --   --    Respiratory  --   --   --    Post Vascular Assessment  --   --   --    Dialyzer Appearance  --   --   --    Dialysis Outcome  --   --   --    Post Hemodialysis Patient Status  --   --   --       01/28/24 1830 01/28/24 1845 01/28/24 1855   Hemodialysis Monitor   Patient Status Stable Stable Stable  (Treatment completed.)   Time On  --   --   --    Vitals and Machine Parameters  BP  96/56 104/51 90/47   Pulse 77 76 76   Kt/V  --   --  0.87   Dialysate Rate mL/Min  --  700 mL/Min 700 mL/Min   Blood Flow Rate mL/Min  --  350 mL/Min 350 mL/Min   Arterial Pressure  --  -140 mmHg -140 mmHg   Venous Pressure  --  180 mmHg 180 mmHg   Transmembrane Pressure mmHg  --  60 mmHg 60 mmHg   Ultrafiltration Rate (Pt. Fluid Removal) ml/hr  --  770 770   Fluids  --   --  Normal Saline   IV Fluid Administered Due To:  --   --  Standard Rinseback   Fluid Volume  --   --  250 mL   Fluid Goal  --  2 Kg 2 Kg   Actual Fluid Goal  --  2.5 Kg 2.5 Kg   Actual Fluid Removal  --  2370 mL 2500 mL   Treatment Total Fluid Removal  --   --  2000 mL   Patient Observed Yes Yes Yes   Additional Hemodialysis Documentation   Dialysis Access Visible Yes Yes Yes   Hemodialysis Line Secure Yes Yes Yes   Machine Serial Number  --   --   --    RO Serial Number  --   --   --    Alarm Pass Time  --   --   --    Dialyzer Lot#  --   --   --    Tubing Lot#  --   --   --    RO/Machine Log Complete  --   --   --    All Connections Secured  --   --   --    Venous/Arterial parameters set  --   --   --    Prime Given  --   --   --    IT consultant engages   --   --   --    Saline Line double clamped   --   --   --    Incapacitated Nurse Education Completed   --   --   --    Post Dialysis Vitals   Weight (Post Dialysis)  --   --   --    Weight Source  --   --   --    Temp  --   --   --    Temp Source  --   --   --    BP (Non-Invasive)  --   --   --    BP Source (Non-Invasive)  --   --   --    Heart Rate  --   --   --    RR  --   --   --    End Treatment Kt/V  --   --   --    Post Dialysis Assessment   Time Off  --   --   --    $ Treatment Completed  --   --   --    Cardiac  --   --   --    Respiratory  --   --   --    Post Vascular Assessment  --   --   --    Dialyzer Appearance  --   --   --    Dialysis Outcome  --   --   --  Post Hemodialysis Patient Status  --   --   --       01/28/24 1920   Hemodialysis Monitor   Patient Status  --     Time On  --    Vitals and Machine Parameters   BP  --    Pulse  --    Kt/V  --    Dialysate Rate mL/Min  --    Blood Flow Rate mL/Min  --    Arterial Pressure  --    Venous Pressure  --    Transmembrane Pressure mmHg  --    Ultrafiltration Rate (Pt. Fluid Removal) ml/hr  --    Fluids  --    IV Fluid Administered Due To:  --    Fluid Volume  --    Fluid Goal  --    Actual Fluid Goal  --    Actual Fluid Removal  --    Treatment Total Fluid Removal  --    Patient Observed  --    Additional Hemodialysis Documentation   Dialysis Access Visible  --    Hemodialysis Line Secure  --    Machine Serial Number  --    RO Serial Number  --    Alarm Pass Time  --    Dialyzer Lot#  --    Tubing Lot#  --    RO/Machine Log Complete  --    All Connections Secured  --    Venous/Arterial parameters set  --    Prime Given  --    IT consultant engages   --    Saline Line double clamped   --    Incapacitated Nurse Education Completed   --    Post Dialysis Vitals   Weight (Post Dialysis) 109 kg (240 lb 4.8 oz)   Weight Source Bed   Temp 36.5 C (97.7 F)   Temp Source Oral   BP (Non-Invasive) (!) 107/54   BP Source (Non-Invasive) RA   Heart Rate 77   RR 20   End Treatment Kt/V 0.87   Post Dialysis Assessment   Time Off 1855   $ Treatment Completed Yes   Cardiac Pulse Regular   Respiratory Lungs Clear   Post Vascular Assessment bruit audible, strong;thrill palpable, strong   Dialyzer Appearance Streaked <50%   Dialysis Outcome Goal Met   Post Hemodialysis Patient Status Report Given;Stable     MEDICATIONS/BLOOD PRODUCTS:    EDUCATION: Patient:  Yes  Others:  Knowledge Base:  Substantial:   Minimal: Yes  None:  Barriers to Learning:         None:  Teaching Method:   Oral:  Yes Written:  Visual:    Hands On/ Demo:  Topic:  Access Care:  Yes S&S of Infection: Yes  Fluid Management: Yes  K+: Yes  Procedural:Yes    Albumin:   Medications:  Tx Options:   Transplant:    Diet:   Others:  Patient Response:  Verbalized Understanding: Yes   Demonstrate Understanding:   Teach Back:    Return Demo:   No Evidence for Learning:  Requires Follow Up:   None/N/A:    POST TX ACCESS:  AVF/AVG: BLEEDING STOP ART_10___MIN  VEN___10_MIN   +BRUIT/THRILL: Yes  CATHETER LOCKING SOLUTION:  HEPARIN  1000 UNITS/ML:    NA CITRATE:   SALINE FLUSH(10MLS EACH):    CATHFLO:  VOLUME ART_/___ML    VEN_/___ML                                                                  CLAMPED AND CAPPED:  /  ACCESS FLOW: Good: Yes  Poor:   Positional:   Reversed:   Clotted:    PRIMARY NURSE REPORT POST TX: Vorous Destinee RN   TIME: 1930

## 2024-01-28 NOTE — ED Nurses Note (Signed)
 Attempted to call report. Receiving flor states they will call back when ready.

## 2024-01-28 NOTE — Progress Notes (Signed)
 Saw the patient in dialysis unit.  Discussed with the dialysis nurse.  UF 2 L.  Avoid hypotension.  We will follow.

## 2024-01-28 NOTE — Consults (Signed)
 [facility]  NEPHROLOGY CONSULT    DATE: 01/28/2024  REASON FOR CONSULTATION:  ESRD    HPI: [name] is a 58 y.o. year old female who is admitted for Chest pain. We are asked to see them for ESRD.  Patient has a past medical history of coronary artery disease type 2 diabetes mellitus, anxiety depression hypertension and has presented in the hospital with a complaint of left-sided chest pain and left arm numbness have some nausea pain was addressed as well.  Patient's regular dialysis days are Monday Wednesday Friday has missed dialysis treatment at center today.  No shortness of breath no headache no fever no chills denies cough no significant lower extremity edema.  Will arrange hemodialysis today.        Past Medical History:   Diagnosis Date    Anxiety     Arthritis     hips    Beta blocker prescribed for left ventricular systolic dysfunction     Labetalol      Cancer (CMS HCC) 2019, 2021    kidney, 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 body habitus; Left vent. EF est. 55-60%; mildy  calcified aortic vlave leaflets-especially right coroanry cusp; Small mobile density on aortic valve-no aortic valve stenosis or regurg; Concentric remodeling; mild MR; Trace AR; Trileaflet aortic vlave; Nl IVC size wiht <50% insp. collapse;       Hyperlipidemia     Hyperparathyroidism (CMS HCC)     Hyperthyroidism     Old records states hyperthyroidism, patient denies, states has hyperparathyroidism    Irritable bowel syndrome     Macular edema     Morbid obesity (CMS HCC)     Obesity     Panic attack     Peripheral edema     ankles/feet    Peripheral neuropathy     bilateral feet    Pneumonia 07/2019; 09/2020    COVID Pneumonia 07/2019; fluid overload/pneumonia with recent hosp. 09/2020    Shortness of breath     SOB after climbing 8 steps     Type 2 diabetes mellitus     Wears glasses     reading    White coat hypertension            Social  History     Tobacco Use    Smoking status: Never    Smokeless tobacco: Never   Vaping Use    Vaping status: Never Used   Substance Use Topics    Alcohol use: No    Drug use: No       Family Medical History:       Problem Relation (Age of Onset)    Breast Cancer Paternal Aunt    Cancer Maternal Grandmother    Diabetes Father, Paternal Aunt    High Cholesterol Maternal Uncle, Mother    Hypertension (High Blood Pressure) Maternal Uncle, Father              REVIEW OF SYSTEMS:  ROS  Ten point review of system is negative besides what is mentioned positive in history of presenting illness.  PHYSICAL EXAM:  Physical Exam  Vitals reviewed.   Constitutional:       Appearance: She is not ill-appearing.   HENT:      Head: Normocephalic and atraumatic.   Eyes:      General:         Right eye: No discharge.         Left eye: No discharge.   Cardiovascular:      Rate and Rhythm: Normal rate.      Heart sounds: No murmur heard.  Pulmonary:      Effort: No respiratory distress.      Breath sounds: No rhonchi.   Abdominal:      General: There is no distension.      Tenderness: There is no  abdominal tenderness.   Musculoskeletal:      Cervical back: Neck supple. No rigidity.      Right lower leg: No edema.      Left lower leg: No edema.   Neurological:      Mental Status: She is oriented to person, place, and time.      Cranial Nerves: No cranial nerve deficit.   Psychiatric:         Behavior: Behavior normal.           Lab Results   Component Value Date    SODIUM 141 01/28/2024    POTASSIUM 5.1 01/28/2024    CHLORIDE 99 01/28/2024    CO2 29 01/28/2024    ANIONGAP 13 01/28/2024    BUN 47 (H) 01/28/2024    CREATININE 7.68 (H) 01/28/2024    BUNCRRATIO 6 01/28/2024    GFR 6 (L) 01/28/2024    GLUCOSENF 276 (H) 08/22/2019       CBC  Diff   Lab Results   Component Value Date/Time    WBC 4.9 01/28/2024 10:06 AM    HGB 11.2 (L) 01/28/2024 10:06 AM    HCT 34.9 01/28/2024 10:06 AM    PLTCNT 209 01/28/2024 10:06 AM    SEDRATE 46 (H) 10/24/2014 10:13 AM    ESR 55 (H) 08/20/2019 02:43 PM    RBC 3.28 (L) 01/28/2024 10:06 AM    MCV 106.4 (H) 01/28/2024 10:06 AM    MCHC 32.1 01/28/2024 10:06 AM    MCH 34.1 (H) 01/28/2024 10:06 AM    RDW 14.2 03/31/2020 10:41 AM    MPV 10.9 01/28/2024 10:06 AM    Lab Results   Component Value Date/Time    PMNS 61.7 01/28/2024 10:06 AM    LYMPHOCYTES 13 (L) 03/31/2020 10:41 AM    EOSINOPHIL 1 03/31/2020 10:41 AM    MONOCYTES 8.2 01/28/2024 10:06 AM  BASOPHILS 0.6 01/28/2024 10:06 AM    BASOPHILS <0.10 01/28/2024 10:06 AM    PMNABS 3.01 01/28/2024 10:06 AM    LYMPHSABS 1.35 01/28/2024 10:06 AM    EOSABS <0.10 01/28/2024 10:06 AM    MONOSABS 0.40 01/28/2024 10:06 AM            Lab Results   Component Value Date/Time    COLOR Light Yellow 10/17/2023 03:37 PM    SPECGRAVUR 1.013 10/17/2023 03:37 PM    PHURINE 8.0 (H) 10/17/2023 03:37 PM    PROTEIN 100 (A) 10/17/2023 03:37 PM    GLUCOSE 91 01/28/2024 10:06 AM    KETONES Negative 10/17/2023 03:37 PM    UROBILINOGEN < 2.0 10/17/2023 03:37 PM    LEUKOCYTES Small (A) 10/17/2023 03:37 PM    NITRITE Negative 10/17/2023 03:37 PM    WBC 4.9  01/28/2024 10:06 AM    RBC 3.28 (L) 01/28/2024 10:06 AM    BACTERIA Slight (A) 10/17/2023 03:37 PM    MUCOUS Slight (A) 10/16/2020 09:13 PM    BILIRUBIN Negative 10/17/2023 03:37 PM    HGBURINE Negative 10/17/2023 03:37 PM    SQUAEPIT 5-10 (A) 10/17/2023 03:37 PM         PROBLEMS:  Patient Active Problem List   Diagnosis    Diabetic foot infection (CMS HCC)  (CMS HCC)    Type 2 diabetes mellitus    Cellulitis of foot, left    Uncontrolled hypertension    Obesity    C. 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  Hypertension:  Blood pressure stable.  Chest pain:  Has a history of coronary artery disease did eat meats recommendation and cardiac enzyme follow-up as per primary team and Cardiology.  Avoid hypotension.  Mineral and bone disease continue the outpatient binder.  Anemia:  Anemia of chronic kidney disease hemoglobin is at goal.  I am Dr. Unice Gant I did this consult for nephrology services and thank you very much for allowing me to participate in the care of the patient.

## 2024-01-28 NOTE — Care Management Notes (Signed)
 01/28/24 1300   Assessment Detail   Assessment Type Admission   Date of Care Management Update 01/28/24   Readmission   Is this a readmission? No   Social Work Equities trader Status initial meeting   Projected Discharge Date 01/29/24   Anticipated Discharge Disposition Home   Facility or Agency Preferences None   Plan Home   Patient/Family in Agreement with Plan yes   PAST SERVICES   Have you had any in-home services in the past? No   Have you had Home Health Services in the past? No   Have you been to a Skilled Nursing Facility in the past? No   Discharge Needs Assessment   Concerns To Be Addressed denies needs/concerns at this time   Readmission Within the Last 30 Days no previous admission in last 30 days   Taking blood thinner/anticoagulant therapy at home?   (Aspirin )   Equipment Currently Used at Home none   Currently on Outpatient Dialysis? Yes        Outpatient Dialysis Center Name [facility]        Outpatient Dialysis Center Location [address] [place], [place]        Dialysis Days of the Week Monday-Wednesday-Friday   Transportation Available car;family or friend will provide  (patient and family can provide transportation)   Referral Information   Admission Type observation   Arrived From home or self-care   Address Verified verified-no changes   Insurance Verified verified-no change   Source of Information Patient   Referral Source admission list   Reason for Consult discharge planning   Guardianship   Appointed Healthcare Decision Maker Type: MPOA  (states health care decision maker is daughter, Khadesha Passow)   Employment/Military   Employment Status employed full-time   Patient has Prescription Coverage?  Yes        Pharmacy Name [facility]        Pharmacy Location [place], [place]   Function Health MU group   Deaf or serious difficulty hearing? N   Blind or serious difficulty seeing even with glasses? N   Serious difficulty walking/climbing stairs? N    Difficulty dressing or bathing? N   Has difficulty doing errands because of physical, mental or emotional condition? N   Has serious difficulty concentrating, remembering or making decisions because of physical, mental or emotional condition ? N   Adaptive Devices Used: Denies Need   LAY CAREGIVER    Appointed Lay Caregiver? I Decline   Living Environment   Lives With spouse;child(ren), adult  (3 adult children)   Living Arrangements house  (1 story house with a basement and and not steps to enter)   Current Living Arrangements home   Primary Care Provided by self   Family Caregiver if Needed spouse;child(ren), adult   Able to Return to Prior Arrangements yes   Functional Status Prior   Ambulation 0 - independent   Transferring 0 - independent   Toileting 0 - independent   Bathing 0 - independent   Dressing 0 - independent   Eating 0 - independent   Communication 0 - understands/communicates without difficulty   Swallowing 0 - swallows foods/liquids without difficulty   Functional Status, IADL   Medications independent   MEDICATION MANAGEMENT   Do you manage your own medications Yes   Activity Tolerance   Usual Activity Tolerance good   Current Activity Tolerance moderate     ED Care Manager  Met with patient in ED  ACA 4 (back triage area) to discuss discharge planning. PCP Curvin Downing, NP. Care Management will continue to follow for discharge planning needs.

## 2024-01-28 NOTE — ED Provider Notes (Signed)
 Terree Gaultney, MD  [facility] - Emergency Department  ED Attending Note  01/28/2024          Chief Complaint   Patient presents with    Chest Pain      Pt reports L sided chest pain that radiates into L neck accompanied by nausea x3 hours. Reports hx MI in 03/2024 and states this feels similar. A/Ox4, ambulatory independently.             HISTORY OF PRESENT ILLNESS       [name], date of birth 1966-07-29, is a 58 y.o.female who presents to the Emergency Department for chest pain.  Patient reports that at approximately 7:30 a.m., she developed a squeezing left-sided chest pain and subsequent arm numbness which has progressed for the last several hours.  This also was accompanied to radiation to the jaw and nausea.  She has not vomited.  Patient had a history of a myocardial infarction in July of 2024 and got a stent placed.  Patient states that this feels similar to her prior MI. also reports that she has been under a lot of stress lately.  Also reports shortness of breath.  Patient did not take any medication for this.  She has a history of renal cell carcinoma status post left-sided nephrectomy, CAD, diabetes, and ESRD on dialysis Monday Wednesday Friday.                                                    PATIENT HISTORY     Past Medical History:  Past Medical History:   Diagnosis Date    Anxiety     Arthritis     hips    Beta blocker prescribed for left ventricular systolic dysfunction     Labetalol      Cancer (CMS HCC) 2019, 2021    kidney, 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 body habitus; Left vent. EF est. 55-60%; mildy calcified aortic vlave leaflets-especially right coroanry cusp; Small mobile density on aortic valve-no aortic valve stenosis or regurg; Concentric remodeling; mild MR; Trace AR; Trileaflet aortic vlave;  Nl IVC size wiht <50% insp. collapse;       Hyperlipidemia     Hyperparathyroidism (CMS HCC)     Hyperthyroidism     Old records states hyperthyroidism, patient denies, states has hyperparathyroidism    Irritable bowel syndrome     Macular edema     Morbid obesity (CMS HCC)     Obesity     Panic attack     Peripheral edema     ankles/feet    Peripheral neuropathy     bilateral feet    Pneumonia 07/2019; 09/2020    COVID Pneumonia 07/2019; fluid overload/pneumonia with recent hosp. 09/2020    Shortness of breath     SOB after climbing 8 steps     Type 2 diabetes mellitus     Wears glasses     reading    White coat hypertension        Past Surgical History:  Past Surgical History:   Procedure Laterality Date    Av fistula placement Left 08/11/2020    Ectopic pregnancy surgery  05/24/1998    Hx cataract removal Bilateral 2016    Hx cholecystectomy  09/10/96    Hx hand surgery Right 2017    Hx other Right 10/18/2020    Hx pelvic laparoscopy  05/24/98    Hx shoulder surgery Left 2017    Hx tubal ligation  09/10/2012    Hymenectomy  1996    Kidney surgery  10/24/17; 03/24/20    Laparoscopic nephrectomy, hand assisted Left 03/24/2020    Laparoscopic partial nephrectomy Left  10/24/2017    Renal biopsy, percutaneous  03/08/2020       Family History:  Family Medical History:       Problem Relation (Age of Onset)    Breast Cancer Paternal Aunt    Cancer Maternal Grandmother    Diabetes Father, Paternal Aunt    High Cholesterol Maternal Uncle, Mother    Hypertension (High Blood Pressure) Maternal Uncle, Father              Social History:  Social History     Tobacco Use    Smoking status: Never    Smokeless tobacco: Never   Vaping Use    Vaping status: Never Used   Substance Use Topics    Alcohol use: No    Drug use: No       Medications:  Current Outpatient Medications   Medication Sig    albuterol  sulfate (PROVENTIL  OR VENTOLIN  OR PROAIR ) 90 mcg/actuation Inhalation oral inhaler Take 1-2 Puffs by inhalation Every 6 hours as needed    ALPRAZolam  (XANAX ) 0.25 mg Oral Tablet Take 1 Tablet (0.25 mg total) by mouth Once per day as needed for Anxiety    aspirin  81 mg Oral Tablet, Chewable Chew 1 Tablet (81 mg total) Once a day    atorvastatin  (LIPITOR) 40 mg Oral Tablet Take 1 Tablet (40 mg total) by mouth Every evening    carvediloL  (COREG ) 3.125 mg Oral Tablet Take 1 Tablet (3.125 mg total) by mouth Twice daily with food    cyclobenzaprine  (FLEXERIL ) 5 mg Oral Tablet Take 1 Tablet (5 mg total) by mouth Three times a day as needed for Muscle spasms    escitalopram  oxalate (LEXAPRO ) 10 mg Oral Tablet Take 1 Tablet (10 mg total) by mouth Every night    Fish Oil -Omega-3 Fatty Acids  360-1,200 mg Oral Capsule Take 2 Capsules by mouth Twice daily    fluticasone  (FLONASE ) 50 mcg/actuation Nasal Spray, Suspension  Administer 1 Spray into each nostril Once per day as needed (Allergy symptoms (nasal congestion/ runny nose))    iron sucrose (VENOFER) 50 mg iron/2.5 mL Intravenous Solution 2.5 mL (50 mg total)    loperamide  (IMODIUM ) 2 mg Oral Capsule Take 2 Capsules (4 mg total) by mouth Every 6 hours as needed (Diarrhea)    loratadine  (CLARITIN ) 10 mg Oral Tablet Take 1 Tablet (10 mg total) by mouth Once  per day as needed (Allergies)    losartan  (COZAAR ) 25 mg Oral Tablet Take 1 Tablet (25 mg total) by mouth Once a day for 30 days (Patient taking differently: Take 1 Tablet (25 mg total) by mouth Take in the evening on non-dialysis days (Sun/Tues/Thurs/Sat); does not take on dialysis days)    MOUNJARO 7.5 mg/0.5 mL Subcutaneous Pen Injector Inject 0.5 mL (7.5 mg total) under the skin Every 7 days    nitroGLYCERIN  (NITROSTAT ) 0.4 mg Sublingual Tablet, Sublingual Place 1 Tablet (0.4 mg total) under the tongue Every 5 minutes as needed for Chest pain for up to 10 doses for 3 doses over 15 minutes    omega-3-DHA-EPA-fish oil  (FISH OIL ) 1,000 mg (120 mg-180 mg) Oral Capsule Take by mouth    promethazine -dextromethorphan (PHENERGAN -DM) 6.25-15 mg/5 mL Oral Syrup TAKE 5 ML BY MOUTH 4 TIMES DAILY AS NEEDED FOR COUGH    sevelamer  carbonate (RENVELA ) 800 mg Oral Tablet Take 2 Tablets (1,600 mg total) by mouth    ticagrelor  (BRILINTA ) 90 mg Oral Tablet Take 1 Tablet (90 mg total) by mouth Twice daily    traMADoL  (ULTRAM ) 50 mg Oral Tablet Take 1 Tablet (50 mg total) by mouth Every 6 hours as needed for Pain    TRIPHROCAPS 1 mg Oral Capsule Take 1 Capsule by mouth Every night    vitamin E 100 unit Oral Capsule Take 1 Capsule (100 Units total) by mouth Twice daily       Allergies:  Allergies   Allergen Reactions    Gabapentin Itching    Hydrocodone      Ibuprofen      Vicodin [Hydrocodone -Acetaminophen ] Itching                                                      PHYSICAL EXAM     Vitals:  ED Triage Vitals [01/28/24 0953]   BP (Non-Invasive) (!) 149/71   Heart Rate 83   Respiratory Rate 16   Temperature 36.5 C (97.7 F)   SpO2 100 %   Weight 111 kg (245 lb)   Height 1.702 m (5\' 7" )       Constitutional: The patient is alert. No diaphoresis.  HEENT: Atraumatic, normocephalic  Pulmonary: Lungs clear to auscultation bilaterally. No wheezing, rales or rhonchi.  Cardiovascular: Regular rate and rhythm. No murmurs.  Abdomen: Soft,  non-tender, non-distended without evidence of rebound or guarding.  Skin: No cyanosis, jaundice, rash or lesion.  Neurologic: Alert and oriented. Moving all extremities symmetrically.  Musculoskeletal: No deformities, trauma, or lower extremity edema.  Psychiatric: Normal mood and affect. Intact judgment and insight.                                                DIAGNOSTIC STUDIES  Labs:    Results for orders placed or performed during the hospital encounter of 01/28/24   COMPREHENSIVE METABOLIC PANEL, NON-FASTING   Result Value Ref Range    SODIUM 141 136 - 145 mmol/L    POTASSIUM 5.1 3.5 - 5.1 mmol/L    CHLORIDE 99 96 - 111 mmol/L    CO2 TOTAL 29 22 - 30 mmol/L    ANION GAP 13 4 - 13 mmol/L    BUN 47 (H) 8 - 25 mg/dL    CREATININE 4.01 (H) 0.60 - 1.05 mg/dL    BUN/CREA RATIO 6 6 - 22    ALBUMIN 3.5 3.5 - 5.0 g/dL     CALCIUM  9.4 8.6 - 10.2 mg/dL    GLUCOSE 91 65 - 027 mg/dL    ALKALINE PHOSPHATASE 61 50 - 130 U/L    ALT (SGPT) 23 <31 U/L    AST (SGOT)  22 11 - 34 U/L    BILIRUBIN TOTAL 0.5 0.3 - 1.3 mg/dL    PROTEIN TOTAL 7.3 6.4 - 8.3 g/dL    ESTIMATED GFR - FEMALE 6 (L) >=60 mL/min/BSA   TROPONIN-I   Result Value Ref Range    TROPONIN-I HS 13.0 <=14.0 ng/L ng/L   CBC WITH DIFF   Result Value Ref Range    WBC 4.9 3.7 - 11.0 x10^3/uL    RBC 3.28 (L) 3.85 - 5.22 x10^6/uL    HGB 11.2 (L) 11.5 - 16.0 g/dL    HCT 25.3 66.4 - 40.3 %    MCV 106.4 (H) 78.0 - 100.0 fL    MCH 34.1 (H) 26.0 - 32.0 pg    MCHC 32.1 31.0 - 35.5 g/dL    RDW-CV 47.4 25.9 - 56.3 %    PLATELETS 209 150 - 400 x10^3/uL    MPV 10.9 8.7 - 12.5 fL    NEUTROPHIL % 61.7 %    LYMPHOCYTE % 27.7 %    MONOCYTE % 8.2 %    EOSINOPHIL % 1.6 %    BASOPHIL % 0.6 %    NEUTROPHIL # 3.01 1.50 - 7.70 x10^3/uL    LYMPHOCYTE # 1.35 1.00 - 4.80 x10^3/uL    MONOCYTE # 0.40 0.20 - 1.10 x10^3/uL    EOSINOPHIL # <0.10 <=0.50 x10^3/uL    BASOPHIL # <0.10 <=0.20 x10^3/uL    IMMATURE GRANULOCYTE % 0.2 0.0 - 1.0 %    IMMATURE GRANULOCYTE # <0.10 <0.10 x10^3/uL   ECG 12-LEAD  (Take to provider with a brief history)   Result Value Ref Range    Ventricular rate 81 BPM    Atrial Rate 81 BPM    PR Interval 126 ms    QRS Duration 82 ms    QT Interval 392 ms    QTC Calculation 455 ms    Calculated P Axis 13 degrees    Calculated R Axis 10 degrees    Calculated T Axis 53 degrees     Labs reviewed and interpreted by me.    Radiology:    XR CHEST PA AND LATERAL   Final Result   No acute process.                        Radiologist location ID: [id number]             Real-time EKG Interpretation: 12 Lead EKG interpreted by me shows normal sinus rhythm rate of 81, poor R-wave progression, no obvious new ST elevations or depressions.  Intervals are normal.  Similar to prior EKG in our system                                       MEDICAL DECISION MAKING     Medications Ordered/Administered in the ED   NS flush syringe (has no administration in time range)   nitroGLYCERIN  (NITROSTAT ) sublingual tablet (has no administration in time range)   aspirin  chewable tablet 324 mg (has no administration in time range)       ED Course as of 01/28/24 1206   Mon Jan 28, 2024   1202 Patient's workup is reassuring, however givne risk level with typical anginal symptoms will admit. I have spoken to hospitalist Dr. Nelle Ban to admit the patient        Medical Decision Making  Patient is a 58 year old female with a past medical history including CAD with stent to mid LAD in 2024 who presents with left-sided chest pain radiating down the arm with accompanying nausea and shortness of breath similar to her prior MI. patient appears well on exam.  EKG shows poor R-wave progression, otherwise no signs of acute infarction.  Given patient's ongoing chest pain, we will give nitroglycerin , as well as aspirin .  Patient will likely require admission.  We will await laboratory studies including CBC, CMP and troponin.  We will also obtain chest x-ray.    Problems Addressed:  Chest pain with high risk for cardiac etiology: acute  illness or injury that poses a threat to life or bodily functions    Risk  OTC drugs.  Prescription drug management.  Decision regarding hospitalization.                 Pre-Disposition Vitals:  Filed Vitals:    01/28/24 0953   BP: (!) 149/71   Pulse: 83   Resp: 16   Temp: 36.5 C (97.7 F)   SpO2: 100%                                               CLINICAL IMPRESSION     Clinical Impression   Chest pain with high risk for cardiac etiology (Primary)                                                   DISPOSITION PLAN     Admitted    Prescriptions:     New Prescriptions    No medications on file        Follow-Up:     No follow-up provider specified.    Condition at Disposition: Stable    I, Vona Grumet, MD, scribed for Vona Grumet, MD on 01/28/2024 at 9:55 AM     Documentation assistance provided for Arvon Schreiner, MD by scribe Vona Grumet, MD, SCRIBE. Information recorded by the scribe was done at my direction and has been reviewed and validated by me, Shauntia Levengood, MD.

## 2024-01-28 NOTE — Care Plan (Incomplete)
 [facility]  [place], [place] [address]  Care Plan Note    Nursing assessment completed, resp with ease, unlabored, tele intact, no s\s distress noted    Problem: Wound  Goal: Optimal Coping  Outcome: Ongoing (see interventions/notes)  Goal: Optimal Functional Ability  Outcome: Ongoing (see interventions/notes)  Intervention: Optimize Functional Ability  Recent Flowsheet Documentation  Taken 01/28/2024 2100 by Buck Carbon, RN  Activity Management:   ROM, active encouraged   ambulated in hall   up ad lib  Activity Assistance Provided: independent  Goal: Absence of Infection Signs and Symptoms  Outcome: Ongoing (see interventions/notes)  Goal: Improved Oral Intake  Outcome: Ongoing (see interventions/notes)  Goal: Optimal Pain Control and Function  Outcome: Ongoing (see interventions/notes)  Intervention: Prevent or Manage Pain  Recent Flowsheet Documentation  Taken 01/28/2024 2100 by Buck Carbon, RN  Sleep/Rest Enhancement:   awakenings minimized   regular sleep/rest pattern promoted   room darkened   noise level reduced  Goal: Skin Health and Integrity  Outcome: Ongoing (see interventions/notes)  Intervention: Optimize Skin Protection  Recent Flowsheet Documentation  Taken 01/28/2024 2100 by Buck Carbon, RN  Pressure Reduction Techniques:   Mobility is maximized   30 degree lateral position utilized   Moisture, shear and nutrition are maximized   Frequent weight shifting encouraged  Pressure Reduction Devices: Repositioning wedges/pillows utilized  Activity Management:   ROM, active encouraged   ambulated in hall   up ad lib  Head of Bed (HOB) Positioning: HOB at 30-45 degrees  Goal: Optimal Wound Healing  Outcome: Ongoing (see interventions/notes)  Intervention: Promote Wound Healing  Recent Flowsheet Documentation  Taken 01/28/2024 2100 by Buck Carbon, RN  Pressure Reduction Techniques:   Mobility is maximized   30 degree lateral position utilized   Moisture, shear and nutrition are maximized   Frequent weight shifting  encouraged  Pressure Reduction Devices: Repositioning wedges/pillows utilized  Sleep/Rest Enhancement:   awakenings minimized   regular sleep/rest pattern promoted   room darkened   noise level reduced  Activity Management:   ROM, active encouraged   ambulated in hall   up ad lib     Problem: Adult Inpatient Plan of Care  Goal: Plan of Care Review  Outcome: Ongoing (see interventions/notes)  Goal: Patient-Specific Goal (Individualized)  Outcome: Ongoing (see interventions/notes)  Goal: Absence of Hospital-Acquired Illness or Injury  Outcome: Ongoing (see interventions/notes)  Intervention: Identify and Manage Fall Risk  Recent Flowsheet Documentation  Taken 01/28/2024 2100 by Buck Carbon, RN  Safety Promotion/Fall Prevention:   nonskid shoes/slippers when out of bed   fall prevention program maintained  Intervention: Prevent Skin Injury  Recent Flowsheet Documentation  Taken 01/28/2024 2100 by Buck Carbon, RN  Body Position: supine, head elevated  Skin Protection:   tubing/devices free from skin contact   protective footwear used  Intervention: Prevent Infection  Recent Flowsheet Documentation  Taken 01/28/2024 2100 by Buck Carbon, RN  Infection Prevention:   rest/sleep promoted   single patient room provided   promote handwashing  Goal: Optimal Comfort and Wellbeing  Outcome: Ongoing (see interventions/notes)  Intervention: Provide Person-Centered Care  Recent Flowsheet Documentation  Taken 01/28/2024 2100 by Buck Carbon, RN  Trust Relationship/Rapport:   care explained   questions answered   questions encouraged  Goal: Rounds/Family Conference  Outcome: Ongoing (see interventions/notes)     Problem: Fall Injury Risk  Goal: Absence of Fall and Fall-Related Injury  Outcome: Ongoing (see interventions/notes)  Intervention: Identify and Manage Contributors  Recent Flowsheet Documentation  Taken 01/28/2024 2100 by Buck Carbon, RN  Medication Review/Management: medications reviewed  Intervention: Promote Injury-Free Environment  Recent Flowsheet  Documentation  Taken 01/28/2024 2100 by Buck Carbon, RN  Safety Promotion/Fall Prevention:   nonskid shoes/slippers when out of bed   fall prevention program maintained     Problem: Chest Pain  Goal: Resolution of Chest Pain Symptoms  Outcome: Ongoing (see interventions/notes)     Problem: Chronic Kidney Disease  Goal: Electrolyte Balance  Outcome: Ongoing (see interventions/notes)  Goal: Fluid Balance  Outcome: Ongoing (see interventions/notes)  Intervention: Monitor and Manage Hypervolemia  Recent Flowsheet Documentation  Taken 01/28/2024 2100 by Buck Carbon, RN  Skin Protection:   tubing/devices free from skin contact   protective footwear used  Goal: Acceptable Pain Control  Outcome: Ongoing (see interventions/notes)  Intervention: Prevent or Manage Pain  Recent Flowsheet Documentation  Taken 01/28/2024 2100 by Buck Carbon, RN  Sleep/Rest Enhancement:   awakenings minimized   regular sleep/rest pattern promoted   room darkened   noise level reduced  Goal: Minimize Renal Failure Effects  Outcome: Ongoing (see interventions/notes)  Intervention: Monitor and Support Renal Function  Recent Flowsheet Documentation  Taken 01/28/2024 2100 by Buck Carbon, RN  Medication Review/Management: medications reviewed  Intervention: Protect and Monitor Dialysis Access Site  Recent Flowsheet Documentation  Taken 01/28/2024 2100 by Buck Carbon, RN  Safety Precautions:   emergency equipment at bedside   limb precautions maintained

## 2024-01-28 NOTE — ED Nurses Note (Signed)
 Nursing Handoff Report    Reason for Handoff - Patient transferred from ED to inpatient unit    The following was communicated orally to Destiny, RN for coordination of continuing care:    [name], 58 y.o. female    Attending Provider: Erroll Heard, MD    Allergies   Allergen Reactions    Gabapentin Itching    Hydrocodone      Ibuprofen      Vicodin [Hydrocodone -Acetaminophen ] Itching           Active Hospital Problems    Diagnosis    Primary Problem: Chest pain       Most recent vital signs - Temperature: 36.5 C (97.7 F)  Heart Rate: 83  BP (Non-Invasive): (!) 149/71  Respiratory Rate: 16  SpO2: 100 %    Pt is not a high fall risk patient.    Medications Ordered/Administered in the ED   NS flush syringe (has no administration in time range)       Current Outpatient Medications   Medication Sig    albuterol  sulfate (PROVENTIL  OR VENTOLIN  OR PROAIR ) 90 mcg/actuation Inhalation oral inhaler Take 1-2 Puffs by inhalation Every 6 hours as needed    ALPRAZolam  (XANAX ) 0.25 mg Oral Tablet Take 1 Tablet (0.25 mg total) by mouth Once per day as needed for Anxiety    aspirin  81 mg Oral Tablet, Chewable Chew 1 Tablet (81 mg total) Once a day    atorvastatin  (LIPITOR) 40 mg Oral Tablet Take 1 Tablet (40 mg total) by mouth Every evening    carvediloL  (COREG ) 3.125 mg Oral Tablet Take 1 Tablet (3.125 mg total) by mouth Twice daily with food    cyclobenzaprine  (FLEXERIL ) 5 mg Oral Tablet Take 1 Tablet (5 mg total) by mouth Three times a day as needed for Muscle spasms    escitalopram  oxalate (LEXAPRO ) 10 mg Oral Tablet Take 1 Tablet (10 mg total) by mouth Every night    Fish Oil -Omega-3 Fatty Acids  360-1,200 mg Oral Capsule Take 2 Capsules by mouth Twice daily    fluticasone  (FLONASE ) 50 mcg/actuation Nasal Spray, Suspension Administer 1 Spray into each nostril Once per day as needed (Allergy symptoms (nasal congestion/ runny nose))    iron sucrose (VENOFER) 50 mg iron/2.5 mL Intravenous Solution 2.5 mL (50 mg total)     loperamide  (IMODIUM ) 2 mg Oral Capsule Take 2 Capsules (4 mg total) by mouth Every 6 hours as needed (Diarrhea)    loratadine  (CLARITIN ) 10 mg Oral Tablet Take 1 Tablet (10 mg total) by mouth Once per day as needed (Allergies)    losartan  (COZAAR ) 25 mg Oral Tablet Take 1 Tablet (25 mg total) by mouth Once a day for 30 days (Patient taking differently: Take 1 Tablet (25 mg total) by mouth Take in the evening on non-dialysis days (Sun/Tues/Thurs/Sat); does not take on dialysis days)    MOUNJARO 7.5 mg/0.5 mL Subcutaneous Pen Injector Inject 0.5 mL (7.5 mg total) under the skin Every 7 days    nitroGLYCERIN  (NITROSTAT ) 0.4 mg Sublingual Tablet, Sublingual Place 1 Tablet (0.4 mg total) under the tongue Every 5 minutes as needed for Chest pain for up to 10 doses for 3 doses over 15 minutes    omega-3-DHA-EPA-fish oil  (FISH OIL ) 1,000 mg (120 mg-180 mg) Oral Capsule Take by mouth    promethazine -dextromethorphan (PHENERGAN -DM) 6.25-15 mg/5 mL Oral Syrup TAKE 5 ML BY MOUTH 4 TIMES DAILY AS NEEDED FOR COUGH    sevelamer  carbonate (RENVELA ) 800 mg Oral Tablet Take 2 Tablets (  1,600 mg total) by mouth    ticagrelor  (BRILINTA ) 90 mg Oral Tablet Take 1 Tablet (90 mg total) by mouth Twice daily    traMADoL  (ULTRAM ) 50 mg Oral Tablet Take 1 Tablet (50 mg total) by mouth Every 6 hours as needed for Pain    TRIPHROCAPS 1 mg Oral Capsule Take 1 Capsule by mouth Every night    vitamin E 100 unit Oral Capsule Take 1 Capsule (100 Units total) by mouth Twice daily       Peripheral IV Right Median Cubital  (antecubital fossa) (Active)   Line Status flushed without difficulty;blood return present 01/28/24 1006   SITE ASSESSMENT WDL 01/28/24 1006   Phlebitis Scale 0 01/28/24 1006   Infiltration Scale 0 01/28/24 1006   DRESSING TYPE (LINES) Transparent Bordered 01/28/24 1006   Dressing Status Changed/ New 01/28/24 1006       AV Graft Left (Active)       Abnormal assessments communicated to receiving nurse - Yes     Abnormal labs  communicated to receiving nurse - Yes    Abnormal diagnostic exams communicated to receiving nurse - Yes     Home meds with patient (if yes, list specific meds) - N/A    Check all pump infusion rates against doctor's order and verify all weight based medication infusing in kilograms communicated to receiving nurse  - N/A     [facility], RN

## 2024-01-28 NOTE — Care Plan (Signed)
 Problem: Wound  Goal: Optimal Coping  Outcome: Ongoing (see interventions/notes)  Goal: Optimal Functional Ability  Outcome: Ongoing (see interventions/notes)  Goal: Absence of Infection Signs and Symptoms  Outcome: Ongoing (see interventions/notes)  Goal: Improved Oral Intake  Outcome: Ongoing (see interventions/notes)  Goal: Optimal Pain Control and Function  Outcome: Ongoing (see interventions/notes)  Goal: Skin Health and Integrity  Outcome: Ongoing (see interventions/notes)  Goal: Optimal Wound Healing  Outcome: Ongoing (see interventions/notes)     Problem: Adult Inpatient Plan of Care  Goal: Plan of Care Review  Outcome: Ongoing (see interventions/notes)  Goal: Patient-Specific Goal (Individualized)  Outcome: Ongoing (see interventions/notes)  Goal: Absence of Hospital-Acquired Illness or Injury  Outcome: Ongoing (see interventions/notes)  Goal: Optimal Comfort and Wellbeing  Outcome: Ongoing (see interventions/notes)  Goal: Rounds/Family Conference  Outcome: Ongoing (see interventions/notes)

## 2024-01-28 NOTE — Nurses Notes (Signed)
 1320-Attempted to call report to [facility], stated will call back in a few minutes.

## 2024-01-28 NOTE — Nurses Notes (Addendum)
 1410-Pt arrived to floor via wheelchair, pt walked from wheelchair to bed with stand by assist has no c/o pain but nauseated. Tele box placed on pt. Call bell in reach bed alarm set.     1440-Pt off floor to HD     1918-Report given to on coming RN, Thersia Flax. Pt currently off floor in HD.

## 2024-01-29 ENCOUNTER — Observation Stay (HOSPITAL_COMMUNITY)

## 2024-01-29 ENCOUNTER — Encounter (HOSPITAL_COMMUNITY): Payer: Self-pay | Admitting: HOSPITALIST

## 2024-01-29 DIAGNOSIS — R079 Chest pain, unspecified: Secondary | ICD-10-CM

## 2024-01-29 DIAGNOSIS — R9431 Abnormal electrocardiogram [ECG] [EKG]: Secondary | ICD-10-CM

## 2024-01-29 DIAGNOSIS — Z794 Long term (current) use of insulin: Secondary | ICD-10-CM

## 2024-01-29 LAB — ECG 12-LEAD
Atrial Rate: 81 {beats}/min
Calculated P Axis: 13 degrees
Calculated R Axis: 10 degrees
Calculated T Axis: 53 degrees
PR Interval: 126 ms
QRS Duration: 82 ms
QT Interval: 392 ms
QTC Calculation: 455 ms
Ventricular rate: 81 {beats}/min

## 2024-01-29 LAB — TRANSTHORACIC ECHOCARDIOGRAM - ADULT
EF VISUAL ESTIMATE: 60
EF: 65

## 2024-01-29 MED ORDER — SODIUM CHLORIDE 0.9 % INJECTION SOLUTION
2.0000 mL | INTRAVENOUS | Status: AC
Start: 2024-01-29 — End: 2024-01-29
  Administered 2024-01-29: 2 mL via INTRAVENOUS

## 2024-01-29 NOTE — Progress Notes (Signed)
 [name]  PROGRESS NOTE        Subjective:  This is an 58 y.o. year old female we are following for ESRD.  Today chest pain is better.  No shortness of breath no nausea no vomiting.       Objective:    Filed Vitals:    01/28/24 2332 01/29/24 0007 01/29/24 0413 01/29/24 0925   BP:  (!) 111/50 125/62 (!) 121/59   Pulse: 81 83 79 81   Resp:  15 15 18    Temp:  37.2 C (99 F) 36.8 C (98.2 F) 36.9 C (98.4 F)   SpO2:  99% 99% 97%       Physical Exam  Vitals reviewed.   Constitutional:       Appearance: She is obese. She is not ill-appearing.   HENT:      Head: Normocephalic and atraumatic.   Eyes:      General:         Right eye: No discharge.         Left eye: No discharge.   Cardiovascular:      Rate and Rhythm: Normal rate.      Heart sounds: No murmur heard.  Pulmonary:      Effort: No respiratory distress.      Breath sounds: No rhonchi.   Abdominal:      General: There is no distension.      Tenderness: There is no abdominal tenderness.   Musculoskeletal:      Cervical back: Neck supple. No rigidity.      Right lower leg: No edema.      Left lower leg: No edema.   Neurological:      Mental Status: She is oriented to person, place, and time.           Lab Results   Component Value Date    SODIUM 141 01/28/2024    POTASSIUM 5.1 01/28/2024    CHLORIDE 99 01/28/2024    CO2 29 01/28/2024    ANIONGAP 13 01/28/2024    BUN 47 (H) 01/28/2024    CREATININE 7.68 (H) 01/28/2024    BUNCRRATIO 6 01/28/2024    GFR 6 (L) 01/28/2024    GLUCOSENF 276 (H) 08/22/2019       CBC with Diff (Last 48 Hours):    Recent Results in last 48 hours     01/28/24  1006   WBC 4.9   HGB 11.2*   HCT 34.9   MCV 106.4*   PLTCNT 209   PMNS 61.7   LYMPHO 27.7   MONOCYTES 8.2   EOSINO 1.6   BASOPHILS 0.6  <0.10         Patient Active Problem List    Diagnosis     Chest pain     Hypercholesterolemia     NSTEMI (non-ST elevated myocardial infarction) (CMS HCC)     Volume overload     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)     Elevated troponin I  level     History of kidney disease      CKD-Stage 5-HD initiated 10/18/20      Cancer (CMS HCC)      kidney, 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 difficile diarrhea      Only 1 bowel movement. Continue vancomycin  orally and contact isolation. Will continue oral Vanc throughout her course of antibiotics.      Uncontrolled hypertension      Not at goal. Losartan  increased already. Will add Amlodipine  5. Goal <120/80      Obesity      Portion control, has already changed diet      Cellulitis of foot, left      Left foot less warm.  WBC norma on Unasyn  and Vancomycin . Continue same.  We decided to take her off for the next 2 weeks since she stands at work-then restart after spring break on April 4th.  If foot continues to improve she might be able to be discharged after another couple of days      Type 2 diabetes mellitus      Control much improved - A1C 5.2% in January. Continue current regimen      Diabetic foot infection (CMS HCC)  (CMS [facility])          Assessment:    END STAGE RENAL DISEASE  Hypertension    Plan    ESRD:  Dialysis Monday Wednesday Friday we will get dialysis tomorrow.  Chest pain:  History of coronary artery disease treatment recommendation as per primary team and Cardiology.  Hypertension: Blood pressure stable.  Avoid hypotension.  We will follow.

## 2024-01-29 NOTE — Ancillary Notes (Signed)
 Limited echo with definity completed  Definity given by myself

## 2024-02-01 ENCOUNTER — Encounter (INDEPENDENT_AMBULATORY_CARE_PROVIDER_SITE_OTHER): Payer: Self-pay

## 2024-02-02 NOTE — Discharge Summary (Signed)
 [facility]    DISCHARGE SUMMARY      PATIENT NAME:  Chennel, Rowin  MRN:  [id number]  DOB:  05/05/1966    ENCOUNTER START DATE:  01/28/2024  INPATIENT ADMISSION DATE:   DISCHARGE DATE:  01/29/2024    ATTENDING PHYSICIAN: Suellyn Emory  PRIMARY CARE PHYSICIAN: Curvin Downing, NP     ADMISSION DIAGNOSIS: Chest pain  Chief Complaint   Patient presents with    Chest Pain      Pt reports L sided chest pain that radiates into L neck accompanied by nausea x3 hours. Reports hx MI in 03/2024 and states this feels similar. A/Ox4, ambulatory independently.       DISCHARGE DIAGNOSIS:     Principal Problem:  Chest pain    Active Hospital Problems    Diagnosis Date Noted    Principal Problem: Chest pain [R07.9] 01/28/2024      Resolved Hospital Problems   No resolved problems to display.     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 difficile diarrhea 12/02/2014    Uncontrolled hypertension 11/30/2014    Obesity 11/30/2014    Cellulitis of foot, left 11/29/2014    Type 2 diabetes mellitus 10/25/2014    Diabetic foot infection (CMS HCC)  (CMS HCC) 10/24/2014      Patient Active Problem List   Diagnosis Code    Diabetic foot infection (CMS HCC)  (CMS HCC) E11.628, L08.9    Type 2 diabetes mellitus E11.9    Cellulitis of foot, left L03.116    Uncontrolled hypertension I10    Obesity E66.9    C. 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        Details   losartan  25 mg Tablet  Commonly known as: COZAAR   What changed:  when to take this  additional instructions   25 mg, Oral, Daily  Qty: 30 Tablet  Refills: 0            CONTINUE these medications - NO CHANGES were made during your visit.        Details   albuterol  sulfate 90 mcg/actuation oral inhaler  Commonly known as: PROVENTIL  or VENTOLIN  or PROAIR    1-2 Puffs, Inhalation, EVERY 6 HOURS PRN  Qty: 1 Each  Refills: 0     ALPRAZolam  0.25 mg Tablet  Commonly known as: XANAX    0.25 mg, Oral, DAILY PRN  Refills: 0     aspirin  81 mg Tablet, Chewable   81 mg, Daily  Refills: 0     atorvastatin  40 mg Tablet  Commonly known as: LIPITOR   40 mg, Oral, EVERY EVENING  Qty: 90 Tablet  Refills: 3     carvediloL  3.125 mg Tablet  Commonly known as: COREG    3.125 mg, Oral, 2 TIMES DAILY WITH FOOD  Qty: 90 Tablet  Refills: 3     cyclobenzaprine  5 mg Tablet  Commonly known as: FLEXERIL    5 mg, Oral, 3 TIMES DAILY PRN  Refills: 0     escitalopram  oxalate 10 mg Tablet  Commonly known as: LEXAPRO    10 mg, NIGHTLY  Refills: 0     Fish OiL  1,000 (120-180) mg Capsule  Generic drug: omega-3-DHA-EPA-fish oil    Oral  Refills: 0     Fish Oil -Omega-3 Fatty  Acids 360-1,200 mg Capsule   2 Capsules, 2 TIMES DAILY  Refills: 0     fluticasone  propionate 50 mcg/actuation Spray, Suspension  Commonly known as: FLONASE    1 Spray, Each Nostril, DAILY PRN  Refills: 0     iron sucrose 50 mg iron/2.5 mL Solution  Commonly known as: VENOFER   50 mg  Refills: 0     loperamide  2 mg Capsule  Commonly known as: IMODIUM    4 mg, Oral, EVERY 6 HOURS PRN  Refills: 0     loratadine  10 mg Tablet  Commonly known as: CLARITIN    10 mg, Oral, DAILY PRN  Refills: 0     Mounjaro 7.5 mg/0.5 mL Pen Injector  Generic drug: tirzepatide   7.5 mg, Subcutaneous, EVERY 7 DAYS  Refills: 0     nitroGLYCERIN  0.4 mg Tablet, Sublingual  Commonly known as: NITROSTAT    0.4 mg, Sublingual, EVERY 5 MIN PRN, for 3 doses over 15 minutes  Qty: 10 Tablet  Refills: 0     promethazine -dextromethorphan 6.25-15 mg/5 mL Syrup  Commonly known as: PHENERGAN -DM   TAKE 5 ML BY MOUTH 4 TIMES DAILY AS NEEDED FOR COUGH  Refills: 0     sevelamer  carbonate 800 mg Tablet  Commonly known as: RENVELA    1,600 mg, Oral  Refills: 0     ticagrelor  90 mg Tablet  Commonly known as: BRILINTA    90 mg, Oral, 2 TIMES DAILY  Qty: 180 Tablet  Refills: 3     Triphrocaps 1 mg Capsule  Generic drug: B complex-vitamin C -folic acid    1 Capsule, NIGHTLY  Refills: 0     vitamin E 100 unit Capsule   100 Units, 2 TIMES DAILY  Refills: 0            STOP taking these medications.      traMADoL  50 mg Tablet  Commonly known as: ULTRAM               DISCHARGE INSTRUCTIONS:  DISCHARGE INSTRUCTION - RESUME HOME DIET     Diet: RESUME HOME DIET      DISCHARGE INSTRUCTION - ACTIVITY     Activity: AS TOLERATED      ASPIRIN  NOT ORDERED AT THIS TIME     RETURN TO WORK/SCHOOL    To whomsoever this may concern,    Please note that Meshia Crapps @ (DOB [birth year]) was admitted to the hospital through 01/28/2024  and 01/29/24 for acute medical reasons and can return to work on 01/30/24 with no physical restrictions advised at this time. Please do not hesitate to reach out  if you have any further questions or concerns.     Thank you      Follow-up Information       Curvin Downing, NP. Schedule an appointment as soon as possible for a visit in 3 day(s).    Specialty: FAMILY NURSE PRACTITIONER  Why: Monday, Feb 04, 2024 at 11:30am  (Arrive by 11:15am)  Contact information:  [address]  [place] [address]  [phone]                             REASON FOR HOSPITALIZATION AND HOSPITAL COURSE:  This is a 58 y.o., female       1.Chest pain   rule out acute coronary syndrome setting of significant coronary arterial disease with a stent  troponins negative  EKGs unremarkable  echocardiogram  shows EF 60-65%  Resume aspirin  , Brilinta , carvedilol  and nitro  follow up with Cardiology as outpatient    2.History of MI/CAD, placement of DES in 04/23/2023   continue aspirin , Brilinta , carvedilol , nitro    3.Hyperlipidemia  resume atorvastatin  40 mg daily    4.Type 2 D  last A1c 6.7, the patient takes Mounjaro weekly injection, sliding scale, diabetic diet    5.ESRD on HD, MWF schedule,   consulted nephrology for resumption of hemodialysis    6.Secondary hyperparathyroidism  resume sevelamer  for phosphate binder    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 [place] [address]

## 2024-04-04 ENCOUNTER — Other Ambulatory Visit: Payer: Self-pay

## 2024-04-04 ENCOUNTER — Ambulatory Visit
Admission: RE | Admit: 2024-04-04 | Discharge: 2024-04-04 | Disposition: A | Discharge status: Home / Self Care | Source: Ambulatory Visit | Attending: Nurse Practitioner

## 2024-04-04 ENCOUNTER — Other Ambulatory Visit (HOSPITAL_COMMUNITY): Payer: Self-pay | Admitting: Nurse Practitioner

## 2024-04-04 DIAGNOSIS — R791 Abnormal coagulation profile: Secondary | ICD-10-CM | POA: Insufficient documentation

## 2024-04-04 DIAGNOSIS — T82858A Stenosis of vascular prosthetic devices, implants and grafts, initial encounter: Secondary | ICD-10-CM | POA: Insufficient documentation

## 2024-04-04 DIAGNOSIS — Y832 Surgical operation with anastomosis, bypass or graft as the cause of abnormal reaction of the patient, or of later complication, without mention of misadventure at the time of the procedure: Secondary | ICD-10-CM | POA: Insufficient documentation

## 2024-04-04 MED ORDER — LIDOCAINE HCL 10 MG/ML (1 %) INJECTION SOLUTION
Freq: Once | INTRAMUSCULAR | Status: AC | PRN
Start: 2024-04-04 — End: 2024-04-04
  Administered 2024-04-04: 10 mL via INTRADERMAL

## 2024-04-04 MED ORDER — HEPARIN (PORCINE) 1,000 UNIT/ML INJECTION SOLUTION
INTRAMUSCULAR | Status: AC
Start: 2024-04-04 — End: 2024-04-04
  Filled 2024-04-04: qty 1

## 2024-04-04 MED ORDER — HEPARIN (PORCINE) 1,000 UNIT/ML INJECTION SOLUTION
Freq: Once | INTRAMUSCULAR | Status: AC | PRN
Start: 2024-04-04 — End: 2024-04-04
  Administered 2024-04-04: 1000 [IU] via INTRAVENOUS

## 2024-04-04 MED ORDER — HEPARIN (PORCINE) (PF) 2,000 UNIT/1,000 ML IN 0.9 % SODIUM CHLORIDE IV
INTRAVENOUS | Status: AC
Start: 2024-04-04 — End: 2024-04-04
  Filled 2024-04-04: qty 1000

## 2024-04-04 MED ORDER — LIDOCAINE HCL 10 MG/ML (1 %) INJECTION SOLUTION
INTRAMUSCULAR | Status: AC
Start: 2024-04-04 — End: 2024-04-04
  Filled 2024-04-04: qty 20

## 2024-04-04 MED ORDER — FENTANYL (PF) 50 MCG/ML INJECTION SOLUTION
Freq: Once | INTRAMUSCULAR | Status: AC | PRN
Start: 2024-04-04 — End: 2024-04-04
  Administered 2024-04-04: 100 ug via INTRAVENOUS

## 2024-04-04 MED ORDER — FENTANYL (PF) 50 MCG/ML INJECTION SOLUTION
INTRAMUSCULAR | Status: AC
Start: 2024-04-04 — End: 2024-04-04
  Filled 2024-04-04: qty 2

## 2024-04-04 MED ORDER — IOHEXOL 300 MG IODINE/ML INTRAVENOUS SOLUTION
Freq: Once | INTRAVENOUS | Status: AC | PRN
Start: 2024-04-04 — End: 2024-04-04
  Administered 2024-04-04: 20 mL via INTRAVENOUS

## 2024-04-04 MED ORDER — IOHEXOL 300 MG IODINE/ML INTRAVENOUS SOLUTION
INTRAVENOUS | Status: AC
Start: 2024-04-04 — End: 2024-04-04
  Filled 2024-04-04: qty 50

## 2024-04-09 ENCOUNTER — Ambulatory Visit (INDEPENDENT_AMBULATORY_CARE_PROVIDER_SITE_OTHER): Payer: Self-pay | Admitting: Urology

## 2024-04-10 ENCOUNTER — Ambulatory Visit (INDEPENDENT_AMBULATORY_CARE_PROVIDER_SITE_OTHER): Payer: Self-pay | Admitting: Urology

## 2024-04-15 ENCOUNTER — Other Ambulatory Visit: Payer: Self-pay

## 2024-04-15 ENCOUNTER — Ambulatory Visit
Admission: RE | Admit: 2024-04-15 | Discharge: 2024-04-15 | Disposition: A | Discharge status: Home / Self Care | Source: Ambulatory Visit | Attending: Urology | Admitting: Urology

## 2024-04-15 DIAGNOSIS — C642 Malignant neoplasm of left kidney, except renal pelvis: Secondary | ICD-10-CM | POA: Insufficient documentation

## 2024-04-24 ENCOUNTER — Ambulatory Visit: Payer: Self-pay | Admitting: Urology

## 2024-04-24 ENCOUNTER — Encounter (INDEPENDENT_AMBULATORY_CARE_PROVIDER_SITE_OTHER): Payer: Self-pay | Admitting: Urology

## 2024-04-24 ENCOUNTER — Other Ambulatory Visit: Payer: Self-pay

## 2024-04-24 ENCOUNTER — Telehealth: Payer: Self-pay | Admitting: Urology

## 2024-04-24 ENCOUNTER — Telehealth (INDEPENDENT_AMBULATORY_CARE_PROVIDER_SITE_OTHER): Payer: Self-pay | Admitting: Urology

## 2024-04-24 DIAGNOSIS — E041 Nontoxic single thyroid nodule: Secondary | ICD-10-CM

## 2024-04-24 DIAGNOSIS — C642 Malignant neoplasm of left kidney, except renal pelvis: Secondary | ICD-10-CM

## 2024-04-24 HISTORY — DX: Nontoxic single thyroid nodule: E04.1

## 2024-04-24 NOTE — Assessment & Plan Note (Signed)
 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  I advised her to bring the CT images to her surgeon at [facility] for evaluation since she needs to be cleared before she can qualify for a renal transplant.  The surgeon who did her surgery is the best person to evaluate those images and determine if any further workup is necessary or not.

## 2024-04-24 NOTE — Progress Notes (Signed)
 [facility]  [address]  [place] [address]    Telephone Visit    The patient/family initiated a request for telephone service.  Verbal consent for this service was obtained from the patient/family.    04/09/2023    Chief Complaint   Patient presents with    Kidney Cancer         [name] is a 58 y.o. 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  She has been on surveillance since her nephrectomy in June of 2021 in his not had any evidence of recurrence.  They CT scan of the chest abdomen and pelvis on July 22nd shows no evidence of metastatic disease, lymphadenopathy, or new tumors.  There is a hypoechoic nodule in her thyroid  gland.  However, she had a subtotal parathyroidectomy at [facility] last year so it is entirely possible this is just a postoperative change.  She has not complained of any unexpected weight loss, night sweats, or other symptoms suspicious for metastatic cancer.  She did have a myocardial infarction following that surgery last year.      Thyroid  nodule  The patient had a subtotal thyroidectomy last year, so it seems likely that this is more likely some postsurgical change rather than a new malignancy.  I advised her to bring the CT images to her surgeon at [facility] for evaluation since she needs to be cleared before she can qualify for a renal transplant.  The surgeon who did her surgery is the best person to evaluate those images and determine if any further workup is necessary or not.    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  Plan to continue surveillance as per NCCN guidelines with the another CT scan next year.   Orders Placed This Encounter    CT CHEST ABDOMEN PELVIS WO IV CONTRAST       Total provider time spent with the patient on the phone: 10 minutes.    Camellia Sprinkles, MD

## 2024-04-24 NOTE — Telephone Encounter (Signed)
 Patient was here for appointment but Dr.Whitman had to leave for a surgery and is inquiring if she could get her results over the phone    Please advise.  [name]

## 2024-04-24 NOTE — Assessment & Plan Note (Signed)
 There was no evidence of recurrence or metastatic disease on imaging.  Plan to continue surveillance as per NCCN guidelines with the another CT scan next year.

## 2024-05-01 ENCOUNTER — Encounter (INDEPENDENT_AMBULATORY_CARE_PROVIDER_SITE_OTHER): Payer: Self-pay

## 2024-05-01 DIAGNOSIS — E041 Nontoxic single thyroid nodule: Secondary | ICD-10-CM

## 2024-05-01 NOTE — Telephone Encounter (Signed)
 Thyroid  ultrasound ordered as instructed by Dr. Mannie.  He agreed to see the patient to evaluate as appropriate.

## 2024-05-02 ENCOUNTER — Ambulatory Visit (HOSPITAL_COMMUNITY): Payer: Self-pay | Admitting: Student in an Organized Health Care Education/Training Program

## 2024-05-02 NOTE — Telephone Encounter (Signed)
 Pt aware to call and scheduled US  and then call us  to schedule w/Dr.Stevens afterwards.    Chiquita Lien, Office Staff

## 2024-05-09 ENCOUNTER — Ambulatory Visit
Admission: RE | Admit: 2024-05-09 | Discharge: 2024-05-09 | Disposition: A | Discharge status: Home / Self Care | Source: Ambulatory Visit | Attending: Urology | Admitting: Urology

## 2024-05-09 ENCOUNTER — Other Ambulatory Visit: Payer: Self-pay

## 2024-05-09 DIAGNOSIS — E041 Nontoxic single thyroid nodule: Secondary | ICD-10-CM

## 2024-05-12 ENCOUNTER — Other Ambulatory Visit (HOSPITAL_COMMUNITY): Payer: Self-pay | Admitting: CARDIOVASCULAR DISEASE

## 2024-05-12 DIAGNOSIS — I214 Non-ST elevation (NSTEMI) myocardial infarction: Secondary | ICD-10-CM

## 2024-05-12 DIAGNOSIS — I350 Nonrheumatic aortic (valve) stenosis: Secondary | ICD-10-CM

## 2024-05-12 DIAGNOSIS — I5020 Unspecified systolic (congestive) heart failure: Secondary | ICD-10-CM

## 2024-05-12 NOTE — Telephone Encounter (Signed)
 Last office visit on 04/02/24            Component  Ref Range & Units (hover) 3 mo ago  (01/28/24) 6 mo ago  (10/17/23) 10 mo ago  (07/11/23) 10 mo ago  (07/06/23) 1 yr ago  (04/25/23) 1 yr ago  (04/24/23) 1 yr ago  (04/23/23)   SODIUM 141 139 142 137 135 Low  137 139   POTASSIUM 5.1 3.7 4.8 6.4 High Critical  4.1 4.4 4.6   CHLORIDE 99 101 102 107 102 101 100   CO2 TOTAL 29 28 26 20  Low  25 23 27    ANION GAP 13 10 14  High  10 8 13 12    BUN 47 High  23 63 High  63 High  18 48 High  41 High    CREATININE 7.68 High  3.83 High  7.18 High  7.38 High  4.36 High  7.74 High  7.25 High    BUN/CREA RATIO 6 6 9 9 4  Low  6 6   ALBUMIN 3.5 3.7 3.2 Low  3.5 2.8 Low  2.5 Low     CALCIUM  9.4 9.9 CM 8.0 Low  CM 9.4 CM 8.4 Low  CM 7.1 Low  CM 7.4 Low  CM   Comment: Gadolinium-containing contrast can interfere with calcium  measurement.   GLUCOSE 91 143 High  95 105 94 228 High  103   ALKALINE PHOSPHATASE 61 63 52 53      ALT (SGPT) 23 21 19  R 24 High  R      AST (SGOT) 22 22 17  R 21 R      BILIRUBIN TOTAL 0.5 0.6 CM 0.5 CM 0.6 CM      Comment: Naproxen therapy can falsely elevate total bilirubin levels.   PROTEIN TOTAL 7.3 8.1 6.5 7.3      eGFRcr - FEMALE 6 Low  13 Low  CM 6 Low  CM 6 Low  CM 11 Low  CM 6 Low  CM 6 Low  CM   Comment: Stage, GFR, Classification  G1, 90, Normal or High  G2, 60-89, Mildly decreased  G3a, 45-59, Mildly to moderately decreased  G3b, 30-44, Moderately to severely decreased  G4, 15-29, Severely decreased  G5, <15, Kidney failure  In the absence of kidney damage, neither G1 or G2 fulfill criteria for CKD per KDIGO.   Resulting Agency [facility]             Specimen Collected: 01/28/24 10:06 Last Resulted: 01/28/24 10:48

## 2024-05-29 ENCOUNTER — Ambulatory Visit
Payer: Self-pay | Attending: Student in an Organized Health Care Education/Training Program | Admitting: Student in an Organized Health Care Education/Training Program

## 2024-05-29 ENCOUNTER — Encounter (HOSPITAL_COMMUNITY): Payer: Self-pay | Admitting: Student in an Organized Health Care Education/Training Program

## 2024-05-29 ENCOUNTER — Other Ambulatory Visit: Payer: Self-pay

## 2024-05-29 VITALS — BP 120/60 | HR 78 | Temp 97.0°F | Resp 16 | Ht 67.0 in | Wt 255.5 lb

## 2024-05-29 DIAGNOSIS — Z9889 Other specified postprocedural states: Secondary | ICD-10-CM | POA: Insufficient documentation

## 2024-05-29 DIAGNOSIS — Z9089 Acquired absence of other organs: Secondary | ICD-10-CM | POA: Insufficient documentation

## 2024-05-29 DIAGNOSIS — E041 Nontoxic single thyroid nodule: Secondary | ICD-10-CM | POA: Insufficient documentation

## 2024-05-29 NOTE — H&P (Signed)
 E[facility], [facility]EL[facility]  [address]  [place] [address]  Operated by [facility]  History and Physical    Name: [name] MRN:  [id number]   Date: 05/29/2024 DOB:  Sep 16, 1966 (58 y.o.)          Chief Complaint:    Chief Complaint   Patient presents with    Thyroid  Nodule(s)     C/o thyroid  nodule, US  was done 05/09/24, her Urologist recommended to see Doctor Mannie, patient said.     History of Present Illness: [name] is a 58 y.o. female who presents to establish care for thyroid  nodule. She did have parathyroid  surgery approximately 3 years ago at [facility]. 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 She cannot hold her notes as long as she used to be able to, worsening over the past year. Parathyroid  levels and calcium  have been good since surgery. She is on a calcium  replacement and taking Tums. She had low calcium  levels after surgery and had a heart attack 4 days post op. She notes needing a kidney surgery.     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 body habitus; Left vent. EF est. 55-60%; mildy calcified aortic vlave leaflets-especially right coroanry cusp; Small mobile density on aortic valve-no aortic valve stenosis or regurg; Concentric remodeling; mild MR; Trace AR; Trileaflet aortic vlave; Nl IVC size wiht <50% insp. collapse;       Hyperlipidemia     Hyperparathyroidism (CMS HCC)     Hyperthyroidism     Old records states hyperthyroidism, patient denies, states has hyperparathyroidism    Irritable bowel syndrome     Macular edema     Morbid obesity (  CMS HCC)     Nonrheumatic aortic (valve) stenosis 2024    NSTEMI (non-ST elevated myocardial infarction) (CMS HCC) 03/2023    Obesity     Panic attack     Peripheral edema     ankles/feet    Peripheral neuropathy     bilateral feet    Pneumonia 07/2019; 09/2020    COVID Pneumonia 07/2019; fluid overload/pneumonia with recent hosp. 09/2020    Shortness of breath     SOB after climbing 8 steps     Thyroid  nodule 04/24/2024    Type 2 diabetes mellitus     Wears glasses     reading    White coat hypertension          Past Surgical History:  Past Surgical History:   Procedure Laterality Date    Av fistula placement Left 08/11/2020    Ectopic pregnancy surgery  05/24/1998    Hx cataract removal Bilateral 2016    Hx cholecystectomy  09/10/96    Hx hand surgery Right 2017    Hx other Right 10/18/2020    Hx pelvic laparoscopy  05/24/98    Hx shoulder surgery Left 2017    Hx tubal ligation  09/10/2012    Hymenectomy  1996    Kidney surgery  10/24/17; 03/24/20    Laparoscopic nephrectomy, hand assisted Left 03/24/2020    Laparoscopic  partial nephrectomy Left 10/24/2017    Renal biopsy, percutaneous  03/08/2020     Medications:  Outpatient Medications Marked as Taking for the 05/29/24 encounter (Office Visit) with Mannie Meuse, MD   Medication Sig    albuterol  sulfate (PROVENTIL  OR VENTOLIN  OR PROAIR ) 90 mcg/actuation Inhalation oral inhaler Take 1-2 Puffs by inhalation Every 6 hours as needed    ALPRAZolam  (XANAX ) 0.25 mg Oral Tablet Take 1 Tablet (0.25 mg total) by mouth Once per day as needed for Anxiety    aspirin  81 mg Oral Tablet, Chewable Chew 1 Tablet (81 mg total) Daily    atorvastatin  (LIPITOR) 40 mg Oral Tablet Take 1 Tablet (40 mg total) by mouth Every evening    cyclobenzaprine  (FLEXERIL ) 5 mg Oral Tablet Take 1 Tablet (5 mg total) by mouth Three times a day as needed for Muscle spasms    escitalopram  oxalate (LEXAPRO ) 10 mg Oral Tablet Take 1 Tablet (10 mg total) by mouth Every night    Fish Oil -Omega-3 Fatty Acids  360-1,200 mg Oral Capsule Take 2 Capsules by mouth Twice daily    fluticasone  (FLONASE ) 50 mcg/actuation Nasal Spray, Suspension Administer 1 Spray into each nostril Once per day as needed (Allergy symptoms (nasal congestion/ runny nose))    iron sucrose (VENOFER) 50 mg iron/2.5 mL Intravenous Solution 2.5 mL (50 mg total)    loperamide  (IMODIUM ) 2 mg Oral Capsule Take 2 Capsules (4 mg total) by mouth Every 6 hours as needed (Diarrhea)    loratadine  (CLARITIN ) 10 mg Oral Tablet Take 1 Tablet (10 mg total) by mouth Once per day as needed (Allergies)    MOUNJARO 7.5 mg/0.5 mL Subcutaneous Pen Injector Inject 0.5 mL (7.5 mg total) under the skin Every 7 days    nitroGLYCERIN  (NITROSTAT ) 0.4 mg Sublingual Tablet, Sublingual Place 1 Tablet (0.4 mg total) under the tongue Every 5 minutes as needed for Chest pain for up to 10 doses for 3 doses over 15 minutes    omega-3-DHA-EPA-fish oil  (FISH OIL ) 1,000 mg (120 mg-180 mg) Oral Capsule Take by mouth    promethazine -dextromethorphan (PHENERGAN -DM) 6.25-15 mg/5 mL Oral Syrup TAKE  5  ML BY MOUTH 4 TIMES DAILY AS NEEDED FOR COUGH    sevelamer  carbonate (RENVELA ) 800 mg Oral Tablet Take 2 Tablets (1,600 mg total) by mouth    ticagrelor  (BRILINTA ) 90 mg Oral Tablet Take 1 tablet by mouth twice daily    TRIPHROCAPS 1 mg Oral Capsule Take 1 Capsule by mouth Every night    vitamin E 100 unit Oral Capsule Take 1 Capsule (100 Units total) by mouth Twice daily      Family History:  Family Medical History:       Problem Relation (Age of Onset)    Breast Cancer Paternal Aunt    Cancer Maternal Grandmother    Diabetes Father, Paternal Aunt    High Cholesterol Maternal Uncle, Mother    Hypertension (High Blood Pressure) Maternal Uncle, Father            Social History:  Social History     Occupational History    Not on file   Tobacco Use    Smoking status: Never    Smokeless tobacco: Never   Vaping Use    Vaping status: Never Used   Substance and Sexual Activity    Alcohol use: No    Drug use: No    Sexual activity: Yes     Partners: Male     Birth control/protection: Surgical     Allergies:  Allergies[1]    Review of Systems:                                                          Denies fevers or chills. All other systems reviewed and found to be negative.    Physical Exam:  BP (Non-Invasive): 120/60 Temperature: 36.1 C (97 F) Heart Rate: 78 Respiratory Rate: 16 SpO2: 94 %  Height: 170.2 cm (5' 7) Weight: 116 kg (255 lb 8.2 oz) Body mass index is 40.02 kg/m.    General Appearance: Pleasant, cooperative, healthy, and in no acute distress.  Eyes: Conjunctivae/corneas clear, PERRLA, EOM's intact.  Head and Face: Normocephalic, atraumatic.  Face symmetric, no obvious lesions.   Cardiovascular:  Good perfusion of upper extremities.  No cyanosis of the hands or fingers.  Lungs: No apparent stridorous breathing. No acute distress. Equal work of breathing without any tachypnea or accessory muscle use   Skin: Skin warm and dry.  Neurologic: Cranial nerves:  grossly intact.  Psychiatric:  Alert and oriented x  3.    Review of Information:  05/09/24 US  thyroid :     Thyroid :   Right lobe measurement: 6.1 cm,4.9 cm X  2.1 cm X 1.6 cm  Left lobe measurement: 4.9 cm X 1.5 cm X 1.9 cm  Isthmus measurement: 0.8 cm  Vascular flow: Normal.  Cervical lymph nodes: None enlarged in the images provided.  Normal echotexture.      Thyroid  nodules described using ACR TI-RADS as follows:     Nodule #1:  Location: Right lower pole (image # 13).   Size: 1.7 x 1 x 1.8 cm.  Change: Not applicable (initial assessment).  Composition: Almost completely cystic (0).  Echogenicity: Anechoic (0).  Shape (transverse plane): Wider-than-tall (0).  Margin: Irregular (2).  Echogenic foci: Punctate echogenic foci (3).    Total points: 5.  Category: TI-RADS 4 - Moderately suspicious.  Recommendation: FNA.  Nodule #2:  Location: Left mid pole (image # 27).   Size: 1.4 x 0.8 x 1.3 cm.  Change: Not applicable (initial assessment).  Composition: Mixed cystic and solid (1).  Echogenicity: Isoechoic (1).  Shape (transverse plane): Wider-than-tall (0).  Margin: Smooth (0).  Echogenic foci: None (0).    Total points: 2.  Category: TI-RADS 2 - Not suspicious.  Recommendation: No ultrasound follow-up or FNA.      IMPRESSION:  Single nodule meeting ACR TI-RADS criteria for recommendation of an FNA as detailed above, right lower pole.      Assessment:   [name] is a 58 y.o. female who presents w/      ICD-10-CM    1. Thyroid  nodule  E04.1 IR THYROID  FINE NEEDLE ASPIRATION      2. Hx of parathyroidectomy  Z98.890     Z90.89             Plan:  Orders Placed This Encounter    IR THYROID  FINE NEEDLE ASPIRATION     Will refer to Dr. Fernando for FNA. Discussed referral to subspecialist for surgical intervention, given previous parathyroidectomy, if FNA results require surgical intervention.  Will call with results.     [name], SCRIBE 05/29/2024 13:44   I am scribing for, Richardean Gravely, MD for services provided on 05/29/2024. [name], Scribe [name],  Scribe 05/29/2024, 13:45     I personally performed the services described in this documentation, as scribed  in my presence, and it is both accurate  and complete.    Richardean Gravely, MD    Richardean Gravely, MD    CC:    PCP Erminio Cobble, NP  [address] [facility] [address] [place] [address]   Referring Provider No referring provider defined for this encounter.     This note was completed using voice recognition technology. Please excuse any errors that have occurred as a result and contact me if any parts of the note are unclear.          [1]   Allergies  Allergen Reactions    Gabapentin Itching    Hydrocodone      Ibuprofen      Vicodin [Hydrocodone -Acetaminophen ] Itching

## 2024-06-05 ENCOUNTER — Telehealth (HOSPITAL_COMMUNITY): Payer: Self-pay | Admitting: Student in an Organized Health Care Education/Training Program

## 2024-06-09 ENCOUNTER — Other Ambulatory Visit (HOSPITAL_COMMUNITY): Payer: Self-pay | Admitting: Nurse Practitioner

## 2024-06-09 DIAGNOSIS — N186 End stage renal disease: Secondary | ICD-10-CM

## 2024-06-22 ENCOUNTER — Other Ambulatory Visit (HOSPITAL_COMMUNITY): Payer: Self-pay | Admitting: CARDIOVASCULAR DISEASE

## 2024-06-22 DIAGNOSIS — E782 Mixed hyperlipidemia: Secondary | ICD-10-CM

## 2024-06-26 ENCOUNTER — Ambulatory Visit
Admission: RE | Admit: 2024-06-26 | Discharge: 2024-06-26 | Disposition: A | Discharge status: Home / Self Care | Source: Ambulatory Visit | Attending: Student in an Organized Health Care Education/Training Program | Admitting: Student in an Organized Health Care Education/Training Program

## 2024-06-26 ENCOUNTER — Other Ambulatory Visit: Payer: Self-pay

## 2024-06-26 DIAGNOSIS — Z9089 Acquired absence of other organs: Secondary | ICD-10-CM | POA: Insufficient documentation

## 2024-06-26 DIAGNOSIS — E041 Nontoxic single thyroid nodule: Secondary | ICD-10-CM | POA: Insufficient documentation

## 2024-06-26 DIAGNOSIS — Z9889 Other specified postprocedural states: Secondary | ICD-10-CM | POA: Insufficient documentation

## 2024-06-26 MED ORDER — LIDOCAINE HCL 10 MG/ML (1 %) INJECTION SOLUTION
INTRAMUSCULAR | Status: AC
Start: 2024-06-26 — End: 2024-06-26
  Filled 2024-06-26: qty 20

## 2024-06-26 MED ORDER — LIDOCAINE HCL 10 MG/ML (1 %) INJECTION SOLUTION
Freq: Once | INTRAMUSCULAR | Status: AC | PRN
Start: 2024-06-26 — End: 2024-06-26
  Administered 2024-06-26: 5 mL via INTRADERMAL

## 2024-06-27 DIAGNOSIS — E041 Nontoxic single thyroid nodule: Secondary | ICD-10-CM

## 2024-06-27 LAB — CYTOPATHOLOGY, FINE NEEDLE ASPIRATE

## 2024-07-01 ENCOUNTER — Telehealth (HOSPITAL_COMMUNITY): Payer: Self-pay | Admitting: Student in an Organized Health Care Education/Training Program

## 2024-07-01 NOTE — Telephone Encounter (Signed)
 Called Pt to advise message below from Dr Mannie. Pt voiced understanding. Pt denied having any questions or concerns at this time.[name] [place], [place]          Please inform patient :     Thyroid  biopsy results returned BENIGN, meaning no recommendation for surgery.  That is wonderful news, given her surgery would have been high risk given her parathyroid  hx.       I recommend she gets repeat US  of the thyroid  in 2 years just to watch the size, her PCP can do this.     Thanks   Richardean

## 2024-07-04 ENCOUNTER — Other Ambulatory Visit (HOSPITAL_COMMUNITY): Payer: Self-pay | Admitting: CARDIOVASCULAR DISEASE

## 2024-07-04 NOTE — Progress Notes (Signed)
 PROVIDER ROUNDING NOTE BASIC HD - [name] - Chart #: [id number]  Clinic: [facility]  Modality: IHD    Method of Interaction: Face to face  Comments: Feeling good, no concerns  Date of Interaction: 06/30/2024    Patient is stable    Prior Treatment:     07/02/2024   Dialyzer:            FX CorAL 80   Dialysate:           2.0 K, 2.5 Ca, 1.0 Mg, 100 Dextrose  (G2251)   Actual Time:         03:12          Prescribed Time:     3:15   Avg BFR:             420            Avg DFR:             500   Wt Gain (kg):        1.90           EDW (kg):            113.00   post Wt (kg):            HOME MEDICATIONS     aspirin  (aspirin ) 81 mg, oral, 1 tablet once a day   atorvastatin  (atorvastatin ) 40 mg, oral, 1 tablet every evening   Brilinta  (ticagrelor ) 90 mg, oral, 1 tablet twice a day   Lexapro  (escitalopram  oxalate) 10 mg, oral, 1 tablet every night   losartan  (losartan ) 25 mg, oral, 1 tablet every evening   metoprolol  succinate (metoprolol  succinate) 25 mg, oral, 1 tablet once a day   Mounjaro (tirzepatide) 12.5 mg/0.5 mL, subQ, 1 pen injector once a week   Rena-Vite (b complex-vitamin c -folic acid ) 0.8 mg, oral, 1 tablet once a day [(Take 1 tablet in the evening)]   Tums (calcium  carbonate) 200 mg calcium  (500 mg), oral, 2 tablet twice a day   zolpidem  (zolpidem ) 5 mg, oral, 1 tablet [for insomnia]  Alerts  continue current HD script  dialyze to EDW, avoid cramping, avoid hypotension  if patient needs cardiac clearance prior to parathyroidectomy will send referral to cardiology    Karolyn Gable, NP

## 2024-07-09 ENCOUNTER — Emergency Department
Admission: EM | Admit: 2024-07-09 | Discharge: 2024-07-09 | Disposition: A | Discharge status: Home / Self Care | Attending: Emergency Medicine | Admitting: Emergency Medicine

## 2024-07-09 ENCOUNTER — Other Ambulatory Visit: Payer: Self-pay

## 2024-07-09 ENCOUNTER — Encounter (HOSPITAL_COMMUNITY): Payer: Self-pay

## 2024-07-09 DIAGNOSIS — Z91158 Patient's noncompliance with renal dialysis for other reason: Secondary | ICD-10-CM | POA: Insufficient documentation

## 2024-07-09 DIAGNOSIS — I120 Hypertensive chronic kidney disease with stage 5 chronic kidney disease or end stage renal disease: Secondary | ICD-10-CM | POA: Insufficient documentation

## 2024-07-09 DIAGNOSIS — R0789 Other chest pain: Secondary | ICD-10-CM | POA: Insufficient documentation

## 2024-07-09 DIAGNOSIS — E1122 Type 2 diabetes mellitus with diabetic chronic kidney disease: Secondary | ICD-10-CM | POA: Insufficient documentation

## 2024-07-09 DIAGNOSIS — R7989 Other specified abnormal findings of blood chemistry: Secondary | ICD-10-CM | POA: Insufficient documentation

## 2024-07-09 DIAGNOSIS — Z7985 Long-term (current) use of injectable non-insulin antidiabetic drugs: Secondary | ICD-10-CM | POA: Insufficient documentation

## 2024-07-09 DIAGNOSIS — I252 Old myocardial infarction: Secondary | ICD-10-CM | POA: Insufficient documentation

## 2024-07-09 DIAGNOSIS — Z905 Acquired absence of kidney: Secondary | ICD-10-CM | POA: Insufficient documentation

## 2024-07-09 DIAGNOSIS — Z992 Dependence on renal dialysis: Secondary | ICD-10-CM

## 2024-07-09 DIAGNOSIS — R197 Diarrhea, unspecified: Secondary | ICD-10-CM | POA: Insufficient documentation

## 2024-07-09 DIAGNOSIS — N186 End stage renal disease: Secondary | ICD-10-CM | POA: Insufficient documentation

## 2024-07-09 DIAGNOSIS — E785 Hyperlipidemia, unspecified: Secondary | ICD-10-CM | POA: Insufficient documentation

## 2024-07-09 LAB — CBC WITH DIFF
BASOPHIL #: <0.1 x10ˆ3/uL (ref <=0.20)
BASOPHIL %: 0.4 %
EOSINOPHIL #: 0.1 x10ˆ3/uL (ref <=0.50)
EOSINOPHIL %: 1.8 %
HCT: 34.7 % — ABNORMAL LOW (ref 34.8–46.0)
HGB: 11.3 g/dL — ABNORMAL LOW (ref 11.5–16.0)
IMMATURE GRANULOCYTE #: <0.1 x10ˆ3/uL (ref <0.10)
IMMATURE GRANULOCYTE %: 0.4 % (ref 0.0–1.0)
LYMPHOCYTE #: 1.19 x10ˆ3/uL (ref 1.00–4.80)
LYMPHOCYTE %: 21.8 %
MCH: 33.5 pg — ABNORMAL HIGH (ref 26.0–32.0)
MCHC: 32.6 g/dL (ref 31.0–35.5)
MCV: 103 fL — ABNORMAL HIGH (ref 78.0–100.0)
MONOCYTE #: 0.38 x10ˆ3/uL (ref 0.20–1.10)
MONOCYTE %: 6.9 %
MPV: 10.9 fL (ref 8.7–12.5)
NEUTROPHIL #: 3.76 x10ˆ3/uL (ref 1.50–7.70)
NEUTROPHIL %: 68.7 %
PLATELETS: 175 x10ˆ3/uL (ref 150–400)
RBC: 3.37 x10ˆ6/uL — ABNORMAL LOW (ref 3.85–5.22)
RDW-CV: 14.2 % (ref 11.5–15.5)
WBC: 5.5 x10ˆ3/uL (ref 3.7–11.0)

## 2024-07-09 LAB — ECG 12-LEAD
Atrial Rate: 82 {beats}/min
Calculated P Axis: 38 degrees
Calculated R Axis: -1 degrees
Calculated T Axis: 49 degrees
PR Interval: 134 ms
QRS Duration: 80 ms
QT Interval: 398 ms
QTC Calculation: 464 ms
Ventricular rate: 82 {beats}/min

## 2024-07-09 LAB — COMPREHENSIVE METABOLIC PANEL, NON-FASTING
ALBUMIN: 3.3 g/dL — ABNORMAL LOW (ref 3.5–5.0)
ALKALINE PHOSPHATASE: 61 U/L (ref 50–130)
ALT (SGPT): 31 U/L — ABNORMAL HIGH (ref <31)
ANION GAP: 14 mmol/L — ABNORMAL HIGH (ref 4–13)
AST (SGOT): 25 U/L (ref 11–34)
BILIRUBIN TOTAL: 0.4 mg/dL (ref 0.3–1.3)
BUN/CREA RATIO: 7 (ref 6–22)
BUN: 64 mg/dL — ABNORMAL HIGH (ref 8–25)
CALCIUM: 8.9 mg/dL (ref 8.6–10.2)
CHLORIDE: 105 mmol/L (ref 96–111)
CO2 TOTAL: 24 mmol/L (ref 22–30)
CREATININE: 8.69 mg/dL — ABNORMAL HIGH (ref 0.60–1.05)
GLUCOSE: 111 mg/dL (ref 65–125)
POTASSIUM: 5.2 mmol/L — ABNORMAL HIGH (ref 3.5–5.1)
PROTEIN TOTAL: 7.1 g/dL (ref 6.4–8.3)
SODIUM: 143 mmol/L (ref 136–145)
eGFRcr - FEMALE: 5 mL/min/1.73mˆ2 — ABNORMAL LOW (ref >=60)

## 2024-07-09 LAB — COVID-19, FLU A/B, RSV RAPID BY PCR
INFLUENZA VIRUS TYPE A: NOT DETECTED
INFLUENZA VIRUS TYPE B: NOT DETECTED
RESPIRATORY SYNCTIAL VIRUS (RSV): NOT DETECTED
SARS-CoV-2: NOT DETECTED

## 2024-07-09 LAB — TROPONIN-I
TROPONIN-I HS: 19.9 ng/L (ref <=14.0)
TROPONIN-I HS: 20.9 ng/L (ref <=14.0)

## 2024-07-09 MED ORDER — SODIUM CHLORIDE 0.9 % (FLUSH) INJECTION SYRINGE
10.0000 mL | INJECTION | Freq: Three times a day (TID) | INTRAMUSCULAR | Status: DC
Start: 2024-07-09 — End: 2024-07-09
  Administered 2024-07-09: 0 mL via INTRAVENOUS

## 2024-07-09 MED ORDER — EPOETIN ALFA-EPBX 10,000 UNIT/ML INJECTION SOLUTION
10000.0000 [IU] | INTRAMUSCULAR | Status: DC
Start: 2024-07-09 — End: 2024-07-09

## 2024-07-09 MED ORDER — ALBUMIN, HUMAN 25 % INTRAVENOUS SOLUTION
25.0000 g | Freq: Once | INTRAVENOUS | Status: DC | PRN
Start: 2024-07-09 — End: 2024-07-09

## 2024-07-09 NOTE — Discharge Instructions (Addendum)
 Please make certain to go to hear dialysis treatment this afternoon

## 2024-07-09 NOTE — ED Nurses Note (Signed)
 Patient discharged home with family.  AVS reviewed with patient/care giver.  A written copy of the AVS and discharge instructions was given to the patient/care giver.  Questions sufficiently answered as needed.  Patient/care giver encouraged to follow up with PCP as indicated.  In the event of an emergency, patient/care giver instructed to call 911 or go to the nearest emergency room.        Current Discharge Medication List        CONTINUE these medications - NO CHANGES were made during your visit.        Details   albuterol  sulfate 90 mcg/actuation oral inhaler  Commonly known as: PROVENTIL  or VENTOLIN  or PROAIR    1-2 Puffs, Inhalation, EVERY 6 HOURS PRN  Qty: 1 Each  Refills: 0     ALPRAZolam  0.25 mg Tablet  Commonly known as: XANAX    0.25 mg, DAILY PRN  Refills: 0     aspirin  81 mg Tablet, Chewable   81 mg, Daily  Refills: 0     atorvastatin  40 mg Tablet  Commonly known as: LIPITOR   40 mg, Oral, EVERY EVENING  Qty: 90 Tablet  Refills: 3     carvediloL  3.125 mg Tablet  Commonly known as: COREG    3.125 mg, Oral, 2 TIMES DAILY WITH FOOD  Qty: 90 Tablet  Refills: 3     cyclobenzaprine  5 mg Tablet  Commonly known as: FLEXERIL    5 mg, 3 TIMES DAILY PRN  Refills: 0     escitalopram  oxalate 10 mg Tablet  Commonly known as: LEXAPRO    10 mg, NIGHTLY  Refills: 0     Fish OiL  1,000 (120-180) mg Capsule  Generic drug: omega-3-DHA-EPA-fish oil    Take by mouth  Refills: 0     Fish Oil -Omega-3 Fatty Acids  360-1,200 mg Capsule   2 Capsules, 2 TIMES DAILY  Refills: 0     fluticasone  propionate 50 mcg/actuation Spray, Suspension  Commonly known as: FLONASE    1 Spray, DAILY PRN  Refills: 0     iron sucrose 50 mg iron/2.5 mL Solution  Commonly known as: VENOFER   50 mg  Refills: 0     loperamide  2 mg Capsule  Commonly known as: IMODIUM    4 mg, EVERY 6 HOURS PRN  Refills: 0     loratadine  10 mg Tablet  Commonly known as: CLARITIN    10 mg, DAILY PRN  Refills: 0     losartan  25 mg Tablet  Commonly known as: COZAAR    25 mg, Oral,  Daily  Qty: 30 Tablet  Refills: 0     Mounjaro 7.5 mg/0.5 mL Pen Injector  Generic drug: tirzepatide   7.5 mg, EVERY 7 DAYS  Refills: 0     nitroGLYCERIN  0.4 mg Tablet, Sublingual  Commonly known as: NITROSTAT    0.4 mg, Sublingual, EVERY 5 MIN PRN, for 3 doses over 15 minutes  Qty: 10 Tablet  Refills: 0     promethazine -dextromethorphan 6.25-15 mg/5 mL Syrup  Commonly known as: PHENERGAN -DM   TAKE 5 ML BY MOUTH 4 TIMES DAILY AS NEEDED FOR COUGH  Refills: 0     sevelamer  carbonate 800 mg Tablet  Commonly known as: RENVELA    1,600 mg  Refills: 0     ticagrelor  90 mg Tablet  Commonly known as: BRILINTA    90 mg, Oral, 2 TIMES DAILY  Qty: 180 Tablet  Refills: 1     Triphrocaps 1 mg Capsule  Generic drug: B complex-vitamin C -folic acid    1 Capsule, NIGHTLY  Refills: 0     vitamin E 100 unit Capsule   100 Units, 2 TIMES DAILY  Refills: 0

## 2024-07-09 NOTE — ED Provider Notes (Signed)
 Camellia Amel, DO  [facility] - Emergency Department  ED Attending Note  Name: [name]  DOB:  [birth year]   Date of Service: 07/09/2024  Patient information was obtained from patient.  History/Exam limitations: none.    Chief Complaint   Patient presents with    Flu Like Symptoms    Shortness of Breath    Chest Pain      Tightness, missed hemodialysis  dry weight 246lb           HISTORY OF PRESENT ILLNESS     [name], date of birth [birth year], is a 57 y.o.female who presents to the Emergency Department with flu-like symptoms that started 2-3 days ago. Patient reports symptoms including diarrhea, shortness of breath, and chest tightness. She notes that she lost count of how many bowel movements she has per day. 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 She states she has taken Imodium  for symptomatic relief and she adds that her PCP prescribed her Flagyl, she has taken 2 doses so far. 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 She notes that she missed her dialysis appointment two days ago, she is a Mon-Wed-Fri dialysis patient. She also reports having twinges of chest pain this past week. Pertinent history includes myocardial infarction, chronic kidney disease, left nephrectomy, diabetes mellitus, hypertension, hyperlipidemia. She denies measured fevers, vomiting, or syncope.                                                     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    Constipation     COVID 07/2019    Depression     Diabetes mellitus     Diabetes mellitus, type 2     Diarrhea     Dyspnea on exertion     ESRD (end stage renal disease)     Essential hypertension     Exercise intolerance     Fatty liver     Fluid overload 08/21/21    Recent hospitalization for fluid overload, HD initiated 10/18/20    Headache(784.0)     Hemodialysis patient (CMS HCC)     HD Monday-Wednesday-Friday    History of kidney disease     CKD-Stage 5-HD initiated 10/18/20    History of nephrectomy 03/24/2020    L nephrectomy  Hx of echocardiogram 10/18/2020    Technically difficult d/t pt. body habitus; Left vent. EF est. 55-60%; mildy calcified aortic vlave leaflets-especially right coroanry cusp; Small mobile density on aortic valve-no aortic valve stenosis or regurg; Concentric remodeling; mild MR; Trace AR; Trileaflet aortic vlave; Nl IVC size wiht <50% insp. collapse;       Hyperlipidemia     Hyperparathyroidism (CMS HCC)     Hyperthyroidism     Old records states hyperthyroidism, patient denies, states has hyperparathyroidism    Irritable bowel syndrome     Macular edema     Morbid obesity (CMS HCC)     Nonrheumatic aortic (valve) stenosis 2024    NSTEMI (non-ST elevated myocardial infarction) (CMS HCC) 03/2023    Obesity     Panic attack     Peripheral edema     ankles/feet    Peripheral neuropathy     bilateral feet    Pneumonia 07/2019; 09/2020    COVID Pneumonia 07/2019; fluid overload/pneumonia with recent hosp. 09/2020    Shortness of breath     SOB after climbing 8 steps     Thyroid  nodule 04/24/2024    Type 2 diabetes mellitus     Wears glasses     reading    White coat hypertension      Past Surgical History:  Past Surgical History:   Procedure Laterality Date    Av  fistula placement Left 08/11/2020    Ectopic pregnancy surgery  05/24/1998    Hx cataract removal Bilateral 2016    Hx cholecystectomy  09/10/96    Hx hand surgery Right 2017    Hx other Right 10/18/2020    Hx pelvic laparoscopy  05/24/98    Hx shoulder surgery Left 2017    Hx tubal ligation  09/10/2012    Hymenectomy  1996    Kidney surgery  10/24/17; 03/24/20    Laparoscopic nephrectomy, hand assisted Left 03/24/2020    Laparoscopic partial nephrectomy Left 10/24/2017    Renal biopsy, percutaneous  03/08/2020     Social History:  Social History[1]    Medications:  Current Outpatient Medications   Medication Sig    albuterol  sulfate (PROVENTIL  OR VENTOLIN  OR PROAIR ) 90 mcg/actuation Inhalation oral inhaler Take 1-2 Puffs by inhalation Every 6 hours as needed    ALPRAZolam  (XANAX ) 0.25 mg Oral Tablet Take 1 Tablet (0.25 mg total) by mouth Once per day as needed for Anxiety    aspirin  81 mg Oral Tablet, Chewable Chew 1 Tablet (81 mg total) Daily    atorvastatin  (LIPITOR) 40 mg Oral Tablet Take 1 Tablet (40 mg total) by mouth Every evening    carvediloL  (COREG ) 3.125 mg Oral Tablet Take 1 Tablet (3.125 mg total) by mouth Twice daily with food    cyclobenzaprine  (FLEXERIL ) 5 mg Oral Tablet Take 1 Tablet (5 mg total) by mouth Three times a day as needed for Muscle spasms    escitalopram  oxalate (LEXAPRO ) 10 mg Oral Tablet Take 1 Tablet (10 mg total) by mouth Every night    Fish Oil -Omega-3 Fatty Acids  360-1,200 mg Oral Capsule Take 2 Capsules by mouth Twice daily    fluticasone  (FLONASE ) 50 mcg/actuation Nasal Spray, Suspension Administer 1 Spray into each nostril Once per day as needed (Allergy symptoms (nasal congestion/ runny nose))    iron sucrose (VENOFER) 50 mg iron/2.5 mL Intravenous Solution 2.5 mL (50 mg total)    loperamide  (IMODIUM ) 2 mg Oral Capsule Take 2 Capsules (4 mg total) by mouth Every  6 hours as needed (Diarrhea)    loratadine  (CLARITIN ) 10 mg Oral Tablet Take 1 Tablet (10 mg total) by mouth Once per  day as needed (Allergies)    losartan  (COZAAR ) 25 mg Oral Tablet Take 1 Tablet (25 mg total) by mouth Once a day for 30 days (Patient taking differently: Take 1 Tablet (25 mg total) by mouth Take in the evening on non-dialysis days (Sun/Tues/Thurs/Sat); does not take on dialysis days)    MOUNJARO 7.5 mg/0.5 mL Subcutaneous Pen Injector Inject 0.5 mL (7.5 mg total) under the skin Every 7 days    nitroGLYCERIN  (NITROSTAT ) 0.4 mg Sublingual Tablet, Sublingual Place 1 Tablet (0.4 mg total) under the tongue Every 5 minutes as needed for Chest pain for up to 10 doses for 3 doses over 15 minutes    omega-3-DHA-EPA-fish oil  (FISH OIL ) 1,000 mg (120 mg-180 mg) Oral Capsule Take by mouth    promethazine -dextromethorphan (PHENERGAN -DM) 6.25-15 mg/5 mL Oral Syrup TAKE 5 ML BY MOUTH 4 TIMES DAILY AS NEEDED FOR COUGH    sevelamer  carbonate (RENVELA ) 800 mg Oral Tablet Take 2 Tablets (1,600 mg total) by mouth    ticagrelor  (BRILINTA ) 90 mg Oral Tablet Take 1 tablet by mouth twice daily    TRIPHROCAPS 1 mg Oral Capsule Take 1 Capsule by mouth Every night    vitamin E 100 unit Oral Capsule Take 1 Capsule (100 Units total) by mouth Twice daily     Nursing no longer updates the medication list during triage, therefore the system information above may not be accurate.     Allergies:  Allergies[2]                                                   PHYSICAL EXAM     ED Triage Vitals [07/09/24 0543]   BP (Non-Invasive) (!) 164/81   Heart Rate 85   Respiratory Rate 20   Temperature 36.6 C (97.9 F)   SpO2 100 %   Weight 118 kg (260 lb 12.8 oz)   Height 1.702 m (5' 7)     Body mass index is 40.85 kg/m.    General: Awake. Alert. No acute distress.  Head:  Atraumatic     Eyes: Anicteric sclera, noninjected conjunctiva.  PERRL. EOMI.  ENT:  Oropharynx patent, pink, moist, no erythema, exudates or asymmetry. No stridor.   Neck: Neck is supple, nontender, no adenopathy. No nuchal rigidity.   Lungs: Clear to auscultation bilaterally. No  wheezes, rales or rhonchi. No respiratory distress.  Cardiovascular:  Heart is regular without rubs or gallops. 3/6 systolic murmur.  Abdomen:  Soft, nontender, non-distended.  No peritoneal signs. No pulsatile mass.  Extremities:  No cyanosis, edema, tenderness or asymmetry.  Skin: Skin warm, dry and well-perfused. No petechia or erythema.   Back:  Non-tender to palpation in the midline.    Neurologic: Awake, alert, no lethargy, somnolence or confusion. Moves upper and lower extremities without focal deficits.   Psychiatric: Answers questions appropriately.                                                  DIAGNOSTIC STUDIES     Labs:    Results for orders placed or  performed during the hospital encounter of 07/09/24   COMPREHENSIVE METABOLIC PANEL, NON-FASTING   Result Value Ref Range    SODIUM 143 136 - 145 mmol/L    POTASSIUM 5.2 (H) 3.5 - 5.1 mmol/L    CHLORIDE 105 96 - 111 mmol/L    CO2 TOTAL 24 22 - 30 mmol/L    ANION GAP 14 (H) 4 - 13 mmol/L    BUN 64 (H) 8 - 25 mg/dL    CREATININE 1.30 (H) 0.60 - 1.05 mg/dL    BUN/CREA RATIO 7 6 - 22    ALBUMIN 3.3 (L) 3.5 - 5.0 g/dL     CALCIUM  8.9 8.6 - 10.2 mg/dL    GLUCOSE 888 65 - 874 mg/dL    ALKALINE PHOSPHATASE 61 50 - 130 U/L    ALT (SGPT) 31 (H) <31 U/L    AST (SGOT)  25 11 - 34 U/L    BILIRUBIN TOTAL 0.4 0.3 - 1.3 mg/dL    PROTEIN TOTAL 7.1 6.4 - 8.3 g/dL    eGFRcr - FEMALE 5 (L) >=60 mL/min/1.24m^2   TROPONIN-I   Result Value Ref Range    TROPONIN-I HS 19.9 (HH) <=14.0 ng/L   COVID-19, FLU A/B, RSV RAPID BY PCR   Result Value Ref Range    SARS-CoV-2 Not Detected Not Detected    INFLUENZA VIRUS TYPE A Not Detected Not Detected    INFLUENZA VIRUS TYPE B Not Detected Not Detected    RESPIRATORY SYNCTIAL VIRUS (RSV) Not Detected Not Detected   CBC WITH DIFF   Result Value Ref Range    WBC 5.5 3.7 - 11.0 x10^3/uL    RBC 3.37 (L) 3.85 - 5.22 x10^6/uL    HGB 11.3 (L) 11.5 - 16.0 g/dL    HCT 65.2 (L) 65.1 - 46.0 %    MCV 103.0 (H) 78.0 - 100.0 fL    MCH 33.5 (H) 26.0 -  32.0 pg    MCHC 32.6 31.0 - 35.5 g/dL    RDW-CV 85.7 88.4 - 84.4 %    PLATELETS 175 150 - 400 x10^3/uL    MPV 10.9 8.7 - 12.5 fL    NEUTROPHIL % 68.7 %    LYMPHOCYTE % 21.8 %    MONOCYTE % 6.9 %    EOSINOPHIL % 1.8 %    BASOPHIL % 0.4 %    NEUTROPHIL # 3.76 1.50 - 7.70 x10^3/uL    LYMPHOCYTE # 1.19 1.00 - 4.80 x10^3/uL    MONOCYTE # 0.38 0.20 - 1.10 x10^3/uL    EOSINOPHIL # 0.10 <=0.50 x10^3/uL    BASOPHIL # <0.10 <=0.20 x10^3/uL    IMMATURE GRANULOCYTE % 0.4 0.0 - 1.0 %    IMMATURE GRANULOCYTE # <0.10 <0.10 x10^3/uL   TROPONIN-I   Result Value Ref Range    TROPONIN-I HS 20.9 (HH) <=14.0 ng/L   ECG 12-LEAD (Take to provider with a brief history)   Result Value Ref Range    Ventricular rate 82 BPM    Atrial Rate 82 BPM    PR Interval 134 ms    QRS Duration 80 ms    QT Interval 398 ms    QTC Calculation 464 ms    Calculated P Axis 38 degrees    Calculated R Axis -1 degrees    Calculated T Axis 49 degrees   Labs reviewed and interpreted by me.    EKG:  12 lead EKG interpreted by me:  Please see ED course below  MEDICAL DECISION MAKING       Initial orders placed:   Orders Placed This Encounter    CLOSTRIDIUM DIFFICILE TOXIN DETECTION    COMPREHENSIVE METABOLIC PANEL, NON-FASTING    TROPONIN-I    COVID-19, FLU A/B, RSV RAPID BY PCR    CBC WITH DIFF    TROPONIN-I    OXYGEN - NASAL CANNULA - PRN    ECG 12-LEAD (Take to provider with a brief history)    INSERT & MAINTAIN PERIPHERAL IV ACCESS    NS flush syringe       CBC, CMP, troponin, COVID/FLU/RSV swab, and EKG initially ordered.     07:38: Initial evaluation is complete at this time. I discussed with the patient that I would order C diff toxin detection and repeat troponin to further evaluate. Patient is agreeable with the treatment plan at this time.     11:12: On recheck, the patient is in no acute distress. I explained the results of the diagnostic studies. Patient will be discharged. Patient is to follow up with Julius America,  NP in 1 week. I discussed the diagnosis, disposition, and follow-up plan. The patient understood and is in accordance with the treatment plan at this time. Patient is to return here to the Emergency Department if new or worsening symptoms appear. All of their questions have been answered to their satisfaction. The patient is in stable condition at the time of discharge.      ED Course as of 07/09/24 1859   Wed Jul 09, 2024   9260 Patient presents with acute diarrhea.  She reports that she does have a prior history of C diff although no recent antibiotic use other than the Flagyl that was started she states by her primary care provider.  Her troponin was slightly above the normal cut off.  She said that this week she has noticed some slight twinges of chest pain that last for few sec and go away.  She states that she thinks that is related to lifting heavy things at work.  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  She also reports that she missed dialysis on Monday.  Her potassium is slightly elevated at 5.2 which is not surprising.  She does have a mild anemia but that is stable.  I discussed that we would check a stool sample for C diff as the screening and also out of an abundance of caution, will repeat her troponin at 9.  If the troponin remained stable, plan is for discharge where she will be able to complete her outpatient dialysis treatment at 1:00 p.m.   0741 ECG 12-LEAD (Take to provider with a brief history)  Normal sinus rhythm, rate of 82,  normal axis, Normal intervals, no ischemic findings, normal EKG.     1110 TROPONIN-I HS(!!): 20.9  Stable troponin..  No indication for admission.  Plan is for the patient to be discharged to go to her outpatient dialysis treatment at 1:00 p.m. today        Medical Decision Making  Problems Addressed:  Diarrhea, unspecified type: acute illness or injury     Details: Suspect that this is related to an acute infectious process.   She did take a dose of Imodium  at home although I cautioned the patient about taking more as it can cause constipation.  Given her history of end-stage renal disease she appears euvolemic and therefore additional IV fluids was not given to her especially  considering that she just missed dialysis on Monday.  The C diff test was ordered however the patient did not have any diarrhea during her 5 hours in the emergency department.  Elevated troponin: undiagnosed new problem with uncertain prognosis     Details: Suspect that this is chronic related to her end-stage renal disease.  Her troponin was stable upon rechecked.  She had very atypical chest pain stating that she just noted some periodic twinges of pain.  ESRD on dialysis: chronic illness or injury     Details: The patient did report missing dialysis on Monday, however she is stable for discharge to go to her dialysis treatment in a couple of hours.    Amount and/or Complexity of Data Reviewed  Labs: ordered. Decision-making details documented in ED Course.  ECG/medicine tests: ordered and independent interpretation performed. Decision-making details documented in ED Course.                                                              CLINICAL IMPRESSION     Clinical Impression   Diarrhea, unspecified type (Primary)   ESRD on dialysis   Elevated troponin                                                DISPOSITION PLAN         Pre-Disposition Vitals:  Vitals:    07/09/24 0543   BP: (!) 164/81   Pulse: 85   Resp: 20   Temp: 36.6 C (97.9 F)   SpO2: 100%   Weight: 118 kg (260 lb 12.8 oz)   Height: 1.702 m (5' 7)   BMI: 40.85           Disposition:  Discharged      Follow Up:  Julius America, NP  [address]  [place] [address]  [phone]    Schedule an appointment as soon as possible for a visit in 1 week  for primary medical care follow up.    [facility] - Emergency Department  [address]  [place]   [address]  [phone]    Return right away, if symptoms worsen      Condition on Disposition: Stable    Camellia Amel, DO        Speech recognition software may be utilized to complete certain portions of this note, if errors are found or the meaning of a statement is unclear, please contact the author for clarification.       I Kathrine Pinal, SCRIBE , SCRIBE scribed for [employer], DO.     Documentation assistance provided for Camellia Amel, DO  by Kathrine Pinal, SCRIBE , SCRIBE. Information recorded by the scribe was done at my direction and has been reviewed and validated by Camellia Amel, DO.     Note has been documented by Kathrine Pinal on 07/09/2024         [1]   Social History  Tobacco Use    Smoking status: Never    Smokeless tobacco: Never   Vaping Use    Vaping status: Never Used   Substance Use Topics  Alcohol use: No    Drug use: No   [2]   Allergies  Allergen Reactions    Gabapentin Itching    Hydrocodone      Ibuprofen      Vicodin [Hydrocodone -Acetaminophen ] Itching

## 2024-08-26 NOTE — Discharge Summary (Signed)
 Medicine Discharge Summary  [facility]  [employer]   Patient Name: 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 female patient with past medical history as noted below. Patient presented to the emergency room with complaints of shortness of breath, which patient reports that dyspnea becomes progressively worse with exertion. Patient reported notable weight gain after missed dialysis session on Friday 11/28. Patient reported being seen in the emergency room on Friday in which she was diagnosed with bronchitis and was discharged with antibiotics and steroids. However shortness of breath had become progressively worse prompting her to return to the emergency room. Clinical workup included chest x-ray concerning for pulmonary congestion with significant elevation of proBNP. Patient will be admitted for further clinical workup and medical management for fluid volume overload secondary to missed dialysis session.   (See full History and Physical for details.)    Consultations       Treatment Team:   Attending Provider: Jack Fine Jodie, MD  Consulting Physician: Heron Rondall LABOR, MD    Radiology Exams/Procedures performed       XR CHEST AP PORTABLE      No orders of the defined types were placed in this encounter.       Surgical Procedures Performed       None       Hospital  Course       [name] is a 58 y.o. female patient that was admitted on 08/24/2024 with past medical history as noted below. Patient presented to the emergency room with complaints of shortness of breath, which patient reports that dyspnea becomes progressively worse with exertion. Patient reported notable weight gain after missed dialysis session on Friday 11/28. Patient reported being seen in the emergency room on Friday in which she was diagnosed with bronchitis and was discharged with antibiotics and steroids. However shortness of breath had become progressively worse prompting her to return to the emergency room. Clinical workup included chest x-ray concerning for pulmonary congestion with significant elevation of proBNP. Patient will be admitted for further clinical workup and medical management for fluid volume overload secondary to missed dialysis session.  Patient received hemodialysis on 11/30 followed by second session on 12/01.  She experienced significant improvement in volume overload and shortness of breath.  As of 12/after patient is stable and improved, will be discharged with plan to continue with hemodialysis sessions as scheduled.  Patient will otherwise continue rest of home medications.  Advised patient on following up with PCP.     Discharge Condition: good     Discharge Instructions:          Disposition: Home  Diet: Renal Consistent Carbohydrates  Activity: As tolerated  Discharge Code Status: Full Code    No follow-up provider specified.     Discharge Medications:  Discharge Medication List        Taking      albuterol  sulfate HFA 108 (90 Base) MCG/ACT inhaler  Dose: 2 puff  Commonly known as: PROVENTIL   Inhale 2 puffs into the lungs every 6 (six) hours as needed for Shortness of Breath     ALPRAZolam  0.25 MG tablet  Dose: 0.25 mg  Commonly known as: XANAX   Take 1 tablet (0.25 mg) by mouth 2 (two) times daily as  needed for Anxiety     aspirin  EC 81 MG EC tablet  Dose: 1 tablet  Take 1 tablet (81 mg) by mouth every morning     atorvastatin  40 MG tablet  Dose: 40 mg  Commonly known as: LIPITOR  Take 1 tablet (40 mg) by mouth nightly     busPIRone  5 MG tablet  Dose: 3 tablet  Commonly known as: BUSPAR   Take 3 tablets (15 mg) by mouth every morning     escitalopram  20 MG tablet  Dose: 20 mg  Commonly known as: LEXAPRO   Take 1 tablet (20 mg) by mouth nightly     gentamicin  0.1 % cream  Commonly known as: GARAMYCIN   Apply topically daily     loperamide  2 MG tablet  Dose: 2 mg  Commonly known as: IMODIUM  A-D  Take 1 tablet (2 mg) by mouth as needed for Diarrhea     loratadine  10 MG tablet  Dose: 10 mg  Commonly known as: CLARITIN   Take 1 tablet (10 mg) by mouth every morning     losartan  25 MG tablet  Dose: 25 mg  Commonly known as: COZAAR   Take 1 tablet (25 mg) by mouth nightly     methylPREDNISolone 4 MG tablet  Commonly known as: MEDROL DOSEPAK  follow package directions     metoprolol  succinate XL 25 MG 24 hr tablet  Dose: 25 mg  Commonly known as: TOPROL -XL  Take 1 tablet (25 mg) by mouth every morning     nitroglycerin  0.4 MG SL tablet  Dose: 0.4 mg  Commonly known as: NITROSTAT   Place 1 tablet (0.4 mg) under the tongue every 5 (five) minutes as needed for Chest pain     Rena-Vite Tabs  Dose: 1 tablet  Take 1 tablet by mouth nightly     sodium chloride  0.65 % nasal spray  Dose: 1 spray  Commonly known as: OCEAN  Administer 1 spray into affected nostril(s) as needed for Congestion     Tirzepatide 12.5 MG/0.5ML Soaj  Dose: 12.5 mg  Inject 12.5 mg into the skin Once each week on Thursday evening     traMADol  50 MG tablet  Dose: 50 mg  What changed: Another medication with the same name was removed. Continue taking this medication, and follow the directions you see here.  Commonly known as: ULTRAM   Take 1 tablet (50 mg) by mouth every 6 (six) hours as needed for Pain     Tums 500 MG chewable tablet  Dose: 1,000 mg  Generic drug:  calcium  carbonate  Chew 2 tablets (1,000 mg) by mouth 3 (three) times daily With meals            STOP taking these medications      azithromycin  250 MG tablet  Commonly known as: ZITHROMAX                Discharge Day Exam (08/26/2024):       Blood pressure 135/64, pulse 78, temperature 97.3 F (36.3 C), temperature source Temporal, resp. rate  17, height 1.702 m (5' 7), weight 118.8 kg (262 lb), SpO2 96%.      Physical Exam     General: Patient is awake. In no acute distress.  HEENT: No conjunctival drainage, vision is intact, anicteric sclera.  Neck: Supple, no thyromegaly.  Chest: CTA bilaterally. No rhonchi, no wheezing. No use of accessory muscles.  CVS: Normal rate and regular rhythm no murmurs, without JVD, no pitting edema, pulses palpable.  Abdomen: Soft, non-tender, no guarding or rigidity, with normal bowel sounds.  Extremities: No calf swelling and no gross deformity.  Skin: Warm, dry, no rash and no worrisome lesions.  NEURO: No motor or sensory deficits.  Psychiatric: Alert, interactive, appropriate, normal affect.     Recent Labs      Recent Labs   Lab 08/26/24  0455 08/25/24  0401 08/24/24  0927 08/22/24  0942   WBC 6.3 7.4 10.4 9.1   RBC 3.03* 2.87* 2.99* 3.04*   Hemoglobin 9.9* 9.3* 9.7* 10.2*   Hematocrit 29.3* 27.5* 28.8* 29.5*   MCV 97 96 96 97   Platelet Ct 208 222 231 205             Lab Results   Component Value Date    HGBA1CPERCNT 5.5 07/10/2024     Recent Labs   Lab 08/26/24  1113 08/26/24  0729 08/26/24  0455 08/25/24  2309 08/25/24  1957 08/25/24  0718 08/25/24  0401 08/24/24  1201 08/24/24  0927 08/22/24  0942   Glucose  --   --  89  --   --   --  91  --  172* 145*   Glucose Point of Care Test 145* 95  --  120* 154*  More results in Results Review 95  More results in Results Review  --   --    Sodium  --   --  136  --   --   --  139  --  137 142   Potassium  --   --  4.3  --   --   --  4.6  --  4.2 4.5   Chloride  --   --  100  --   --   --  102  --  100 104   CO2  --   --  25  --   --    --  27  --  23 26   BUN  --   --  33*  --   --   --  39*  --  64* 45*   Creatinine  --   --  4.02*  --   --   --  4.75*  --  7.43* 7.11*   EGFR  --   --  12*  --   --   --  10*  --  6* 6*   Calcium   --   --  9.1  --   --   --  9.3  --  9.8 9.3   More results in Results Review = values in this interval not displayed.     Recent Labs   Lab 08/26/24  0455 08/25/24  0401 08/22/24  0942   Magnesium   --  2.4  --    Phosphorus 3.8 5.3*  --    Albumin  2.7* 2.8* 3.0*   Protein Total  --   --  6.9   Bilirubin Total  --   --  0.5   Activated ALT  --   --  14   Activated AST (SGOT)  --   --  20        Allergies:      Ibuprofen , Oxycodone -acetaminophen , Gabapentin, Hydrocodone , and Hydrocodone -acetaminophen         Imaging Results   XR Chest AP Portable  Result Date: 08/24/2024  1. Stable appearing bilateral perihilar interstitial infiltrates with no focal consolidation pleural effusion or pneumothorax. Possible vascular congestion, viral pneumonia or bronchitis. Normal heart size. Osteoarthritic change of the thoracic spine. ReadingStation:[email or web address]    XR Chest AP Portable  Result Date: 08/22/2024  IMPRESSION: Bilateral perihilar opacities may be due to central vascular congestion, bronchitis or viral pneumonia. ReadingStation:[email or web address]       Home Health Needs:     There are no questions and answers to display.         Social Determinents to Health     Social Drivers of Health     Tobacco Use: Low Risk  (08/24/2024)    Patient History     Smoking Tobacco Use: Never     Smokeless Tobacco Use: Never     Passive Exposure: Not on file   Alcohol Use: Not on file   Financial Resource Strain: Not on file   Food Insecurity: No Food Insecurity (08/24/2024)    Hunger Vital Sign     Worried About Running Out of Food in the Last Year: Never true     Ran Out of Food in the Last Year: Never true   Recent Concern: Food Insecurity - Food Insecurity Present (07/09/2024)    Hunger Vital Sign     Worried About Running Out of Food in the  Last Year: Never true     Ran Out of Food in the Last Year: Sometimes true   Transportation Needs: No Transportation Needs (08/24/2024)    PRAPARE - Therapist, Art (Medical): No     Lack of Transportation (Non-Medical): No   Physical Activity: Not on file   Stress: Not on file   Social Connections: Low Risk (01/28/2024)    Received from [facility]    Social Connections     How often do you see or talk to people that you care about and feel close to? (For example: talking to friends on the phone, visiting friends or family, going to church or club meetings): 5 or more times a week   Intimate Partner Violence: Not At Risk (08/24/2024)    Humiliation, Afraid, Rape, and Kick questionnaire     Fear of Current or Ex-Partner: No     Emotionally Abused: No     Physically Abused: No     Sexually Abused: No   Depression: Not at risk (11/06/2023)    PHQ-2     PHQ-2 Score: 2   Housing Stability: Low Risk  (08/24/2024)    Housing Stability Vital Sign     Unable to Pay for Housing in the Last Year: No     Number of Times Moved in the Last Year: 0     Homeless in the Last Year: No   Utilities: Not At Risk (08/24/2024)    [employer] Utilities     Threatened with loss of utilities: No   Health Literacy: Low Risk (01/28/2024)    Received from [facility]    Health Literacy     How often do you have a problem understanding what is told to you about your medical condition? : Never  Medical Decision Making/Time Spent   Spent 38 minutes reviewing chart, evaluating patient and on discharge planning.       Jodie Jack Fine, MD         08/26/24 1:02 PM   MRN: [id number]                                      CSN: [id number] DOB: 07-13-1966

## 2024-09-10 ENCOUNTER — Emergency Department (HOSPITAL_COMMUNITY)

## 2024-09-10 ENCOUNTER — Inpatient Hospital Stay (HOSPITAL_COMMUNITY): Admitting: INTERNAL MEDICINE

## 2024-09-10 ENCOUNTER — Other Ambulatory Visit: Payer: Self-pay

## 2024-09-10 ENCOUNTER — Inpatient Hospital Stay
Admission: EM | Admit: 2024-09-10 | Discharge: 2024-09-12 | DRG: 280 | Disposition: A | Discharge status: Home / Self Care | Attending: INTERNAL MEDICINE | Admitting: INTERNAL MEDICINE

## 2024-09-10 ENCOUNTER — Encounter (HOSPITAL_COMMUNITY): Payer: Self-pay

## 2024-09-10 DIAGNOSIS — I2510 Atherosclerotic heart disease of native coronary artery without angina pectoris: Secondary | ICD-10-CM | POA: Diagnosis present

## 2024-09-10 DIAGNOSIS — I350 Nonrheumatic aortic (valve) stenosis: Secondary | ICD-10-CM | POA: Diagnosis present

## 2024-09-10 DIAGNOSIS — R079 Chest pain, unspecified: Principal | ICD-10-CM

## 2024-09-10 DIAGNOSIS — I132 Hypertensive heart and chronic kidney disease with heart failure and with stage 5 chronic kidney disease, or end stage renal disease: Secondary | ICD-10-CM | POA: Diagnosis present

## 2024-09-10 DIAGNOSIS — Z7982 Long term (current) use of aspirin: Secondary | ICD-10-CM

## 2024-09-10 DIAGNOSIS — N186 End stage renal disease: Secondary | ICD-10-CM | POA: Diagnosis present

## 2024-09-10 DIAGNOSIS — E7800 Pure hypercholesterolemia, unspecified: Secondary | ICD-10-CM | POA: Diagnosis present

## 2024-09-10 DIAGNOSIS — F419 Anxiety disorder, unspecified: Secondary | ICD-10-CM | POA: Diagnosis present

## 2024-09-10 DIAGNOSIS — Z6841 Body Mass Index (BMI) 40.0 and over, adult: Secondary | ICD-10-CM

## 2024-09-10 DIAGNOSIS — Z955 Presence of coronary angioplasty implant and graft: Secondary | ICD-10-CM

## 2024-09-10 DIAGNOSIS — N2581 Secondary hyperparathyroidism of renal origin: Secondary | ICD-10-CM | POA: Diagnosis present

## 2024-09-10 DIAGNOSIS — R0789 Other chest pain: Secondary | ICD-10-CM | POA: Diagnosis present

## 2024-09-10 DIAGNOSIS — Z992 Dependence on renal dialysis: Secondary | ICD-10-CM

## 2024-09-10 DIAGNOSIS — Z79899 Other long term (current) drug therapy: Secondary | ICD-10-CM

## 2024-09-10 DIAGNOSIS — Z85528 Personal history of other malignant neoplasm of kidney: Secondary | ICD-10-CM

## 2024-09-10 DIAGNOSIS — Z8616 Personal history of COVID-19: Secondary | ICD-10-CM

## 2024-09-10 DIAGNOSIS — Z7902 Long term (current) use of antithrombotics/antiplatelets: Secondary | ICD-10-CM

## 2024-09-10 DIAGNOSIS — F17290 Nicotine dependence, other tobacco product, uncomplicated: Secondary | ICD-10-CM

## 2024-09-10 DIAGNOSIS — R7989 Other specified abnormal findings of blood chemistry: Secondary | ICD-10-CM

## 2024-09-10 DIAGNOSIS — I214 Non-ST elevation (NSTEMI) myocardial infarction: Principal | ICD-10-CM | POA: Diagnosis present

## 2024-09-10 DIAGNOSIS — D631 Anemia in chronic kidney disease: Secondary | ICD-10-CM | POA: Diagnosis present

## 2024-09-10 DIAGNOSIS — I120 Hypertensive chronic kidney disease with stage 5 chronic kidney disease or end stage renal disease: Secondary | ICD-10-CM

## 2024-09-10 DIAGNOSIS — E1122 Type 2 diabetes mellitus with diabetic chronic kidney disease: Secondary | ICD-10-CM | POA: Diagnosis present

## 2024-09-10 DIAGNOSIS — I5032 Chronic diastolic (congestive) heart failure: Secondary | ICD-10-CM | POA: Diagnosis present

## 2024-09-10 DIAGNOSIS — Z905 Acquired absence of kidney: Secondary | ICD-10-CM

## 2024-09-10 DIAGNOSIS — I252 Old myocardial infarction: Secondary | ICD-10-CM

## 2024-09-10 DIAGNOSIS — I249 Acute ischemic heart disease, unspecified: Secondary | ICD-10-CM

## 2024-09-10 LAB — CBC WITH DIFF
BASOPHIL #: <0.1 x10ˆ3/uL (ref <=0.20)
BASOPHIL %: 0.5 %
EOSINOPHIL #: 0.11 x10ˆ3/uL (ref <=0.50)
EOSINOPHIL %: 2.6 %
HCT: 35.7 % (ref 34.8–46.0)
HGB: 11.6 g/dL (ref 11.5–16.0)
IMMATURE GRANULOCYTE #: <0.1 x10ˆ3/uL (ref <0.10)
IMMATURE GRANULOCYTE %: 0 % (ref 0.0–1.0)
LYMPHOCYTE #: 1.05 x10ˆ3/uL (ref 1.00–4.80)
LYMPHOCYTE %: 24.5 %
MCH: 33.8 pg — ABNORMAL HIGH (ref 26.0–32.0)
MCHC: 32.5 g/dL (ref 31.0–35.5)
MCV: 104.1 fL — ABNORMAL HIGH (ref 78.0–100.0)
MONOCYTE #: 0.37 x10ˆ3/uL (ref 0.20–1.10)
MONOCYTE %: 8.6 %
MPV: 9.7 fL (ref 8.7–12.5)
NEUTROPHIL #: 2.74 x10ˆ3/uL (ref 1.50–7.70)
NEUTROPHIL %: 63.8 %
PLATELETS: 213 x10ˆ3/uL (ref 150–400)
RBC: 3.43 x10ˆ6/uL — ABNORMAL LOW (ref 3.85–5.22)
RDW-CV: 17.6 % — ABNORMAL HIGH (ref 11.5–15.5)
WBC: 4.3 x10ˆ3/uL (ref 3.7–11.0)

## 2024-09-10 LAB — LIPID PANEL
CHOL/HDL RATIO: 3.5
CHOLESTEROL: 134 mg/dL (ref 100–200)
HDL CHOL: 38 mg/dL — ABNORMAL LOW (ref >=50)
LDL CALC: 65 mg/dL (ref <=100)
NON-HDL: 96 mg/dL (ref <=190)
TRIGLYCERIDES: 187 mg/dL — ABNORMAL HIGH (ref <150)
VLDL CALC: 28 mg/dL (ref <30)

## 2024-09-10 LAB — COMPREHENSIVE METABOLIC PANEL, NON-FASTING
ALBUMIN: 3.7 g/dL (ref 3.5–5.0)
ALKALINE PHOSPHATASE: 70 U/L (ref 50–130)
ALT (SGPT): 27 U/L (ref <31)
ANION GAP: 12 mmol/L (ref 4–13)
AST (SGOT): 28 U/L (ref 11–34)
BILIRUBIN TOTAL: 0.6 mg/dL (ref 0.3–1.3)
BUN/CREA RATIO: 6 (ref 6–22)
BUN: 40 mg/dL — ABNORMAL HIGH (ref 8–25)
CALCIUM: 10.1 mg/dL (ref 8.6–10.2)
CHLORIDE: 107 mmol/L (ref 96–111)
CO2 TOTAL: 24 mmol/L (ref 22–30)
CREATININE: 6.71 mg/dL — ABNORMAL HIGH (ref 0.60–1.05)
GLUCOSE: 125 mg/dL (ref 65–125)
POTASSIUM: 4.6 mmol/L (ref 3.5–5.1)
PROTEIN TOTAL: 7.5 g/dL (ref 6.4–8.3)
SODIUM: 143 mmol/L (ref 136–145)
eGFRcr - FEMALE: 7 mL/min/1.73mˆ2 — ABNORMAL LOW (ref >=60)

## 2024-09-10 LAB — POC FINGERSTICK GLUCOSE - BMC/JMC (RESULTS): GLUCOSE, POC: 97 mg/dL (ref 60–100)

## 2024-09-10 LAB — TROPONIN-I: TROPONIN-I HS: 28.5 ng/L (ref <=14.0)

## 2024-09-10 MED ORDER — ESCITALOPRAM 10 MG TABLET
10.0000 mg | ORAL_TABLET | Freq: Every evening | ORAL | Status: DC
  Administered 2024-09-10 – 2024-09-11 (×2): 10 mg via ORAL
  Filled 2024-09-10 (×2): qty 1

## 2024-09-10 MED ORDER — ATORVASTATIN 40 MG TABLET
40.0000 mg | ORAL_TABLET | Freq: Every evening | ORAL | Status: DC
  Administered 2024-09-10 – 2024-09-11 (×2): 40 mg via ORAL
  Filled 2024-09-10 (×2): qty 1

## 2024-09-10 MED ORDER — ALPRAZOLAM 0.5 MG TABLET
0.2500 mg | ORAL_TABLET | Freq: Every day | ORAL | Status: DC | PRN
  Administered 2024-09-11: 01:00:00 0.25 mg via ORAL
  Filled 2024-09-10: qty 1

## 2024-09-10 MED ORDER — SODIUM CHLORIDE 0.9 % (FLUSH) INJECTION SYRINGE
10.0000 mL | INJECTION | Freq: Three times a day (TID) | INTRAMUSCULAR | Status: DC
  Administered 2024-09-10 – 2024-09-11 (×3): 0 mL via INTRAVENOUS
  Administered 2024-09-11 – 2024-09-12 (×2): 10 mL via INTRAVENOUS
  Administered 2024-09-12: 0 mL via INTRAVENOUS

## 2024-09-10 MED ORDER — ENOXAPARIN 40 MG/0.4 ML SUB-Q SYRINGE - EAST: 40.0000 mg | INJECTION | Freq: Every day | SUBCUTANEOUS | Status: DC

## 2024-09-10 MED ORDER — NITROGLYCERIN 0.4 MG SUBLINGUAL TABLET
0.4000 mg | SUBLINGUAL_TABLET | SUBLINGUAL | Status: DC | PRN
  Administered 2024-09-10 (×2): 0.4 mg via SUBLINGUAL
  Filled 2024-09-10: qty 1

## 2024-09-10 MED ORDER — INSULIN LISPRO 100 UNIT/ML SUB-Q SSIP PEN
1.0000 [IU] | INJECTION | Freq: Four times a day (QID) | SUBCUTANEOUS | Status: DC
  Administered 2024-09-10 – 2024-09-12 (×7): 0 [IU] via SUBCUTANEOUS
  Filled 2024-09-10: qty 300

## 2024-09-10 MED ORDER — ALBUTEROL SULFATE 2.5 MG/3 ML (0.083 %) SOLUTION FOR NEBULIZATION: 2.5000 mg | INHALATION_SOLUTION | RESPIRATORY_TRACT | Status: DC | PRN

## 2024-09-10 MED ORDER — DEXTROSE 5% IN WATER (D5W) FLUSH BAG - 250 ML: INTRAVENOUS | Status: DC | PRN

## 2024-09-10 MED ORDER — SODIUM CHLORIDE 0.9 % (FLUSH) INJECTION SYRINGE: 10.0000 mL | INJECTION | INTRAMUSCULAR | Status: DC | PRN

## 2024-09-10 MED ORDER — CARVEDILOL 3.125 MG TABLET: 3.1250 mg | ORAL_TABLET | Freq: Two times a day (BID) | ORAL | Status: DC

## 2024-09-10 MED ORDER — DEXTROSE 50 % IN WATER (D50W) INTRAVENOUS SYRINGE: 12.5000 g | INJECTION | INTRAVENOUS | Status: DC | PRN

## 2024-09-10 MED ORDER — SEVELAMER CARBONATE 800 MG TABLET
1600.0000 mg | ORAL_TABLET | Freq: Three times a day (TID) | ORAL | Status: DC
  Administered 2024-09-11 (×2): 0 mg via ORAL
  Administered 2024-09-11 – 2024-09-12 (×3): 1600 mg via ORAL
  Filled 2024-09-10 (×3): qty 2

## 2024-09-10 MED ORDER — METOPROLOL SUCCINATE ER 25 MG TABLET,EXTENDED RELEASE 24 HR
25.0000 mg | ORAL_TABLET | Freq: Every day | ORAL | Status: DC
  Administered 2024-09-10: 19:00:00 0 mg via ORAL
  Administered 2024-09-11: 09:00:00 25 mg via ORAL
  Filled 2024-09-10: qty 1

## 2024-09-10 MED ORDER — ONDANSETRON HCL (PF) 4 MG/2 ML INJECTION SOLUTION: 4.0000 mg | Freq: Three times a day (TID) | INTRAMUSCULAR | Status: DC | PRN

## 2024-09-10 MED ORDER — ASPIRIN 81 MG CHEWABLE TABLET
324.0000 mg | CHEWABLE_TABLET | ORAL | Status: AC
  Administered 2024-09-10: 17:00:00 324 mg via ORAL
  Filled 2024-09-10: qty 4

## 2024-09-10 MED ORDER — ACETAMINOPHEN 325 MG TABLET: 650.0000 mg | ORAL_TABLET | Freq: Four times a day (QID) | ORAL | Status: DC | PRN

## 2024-09-10 MED ORDER — ASPIRIN 81 MG CHEWABLE TABLET
81.0000 mg | CHEWABLE_TABLET | Freq: Every day | ORAL | Status: DC
  Administered 2024-09-11 – 2024-09-12 (×2): 81 mg via ORAL
  Filled 2024-09-10 (×2): qty 1

## 2024-09-10 MED ORDER — HEPARIN (PORCINE) 5,000 UNIT/ML INJECTION SOLUTION
5000.0000 [IU] | Freq: Three times a day (TID) | INTRAMUSCULAR | Status: DC
  Administered 2024-09-10: 21:00:00 0 [IU] via SUBCUTANEOUS
  Filled 2024-09-10: qty 1

## 2024-09-10 MED ORDER — GLUCAGON HCL 1 MG/ML SOLUTION FOR INJECTION: 1.0000 mg | Freq: Once | INTRAMUSCULAR | Status: DC | PRN

## 2024-09-10 MED ORDER — SODIUM CHLORIDE 0.9 % (FLUSH) INJECTION SYRINGE
10.0000 mL | INJECTION | Freq: Three times a day (TID) | INTRAMUSCULAR | Status: DC
  Administered 2024-09-10 – 2024-09-11 (×3): 0 mL via INTRAVENOUS
  Administered 2024-09-11: 17:00:00 10 mL via INTRAVENOUS
  Administered 2024-09-12: 0 mL via INTRAVENOUS
  Administered 2024-09-12: 10 mL via INTRAVENOUS

## 2024-09-10 MED ORDER — BUSPIRONE 5 MG TABLET
5.0000 mg | ORAL_TABLET | Freq: Two times a day (BID) | ORAL | Status: DC
  Administered 2024-09-10 – 2024-09-12 (×4): 5 mg via ORAL
  Filled 2024-09-10 (×4): qty 1

## 2024-09-10 MED ORDER — DEXTROSE 40 % ORAL GEL: 15.0000 g | ORAL | Status: DC | PRN

## 2024-09-10 MED ORDER — LOSARTAN 25 MG TABLET
25.0000 mg | ORAL_TABLET | Freq: Every day | ORAL | Status: DC
  Administered 2024-09-10 – 2024-09-12 (×3): 25 mg via ORAL
  Filled 2024-09-10 (×3): qty 1

## 2024-09-10 MED ORDER — NITROGLYCERIN 0.4 MG SUBLINGUAL TABLET: 0.4000 mg | SUBLINGUAL_TABLET | SUBLINGUAL | Status: DC | PRN

## 2024-09-10 MED ORDER — SODIUM CHLORIDE 0.9% FLUSH BAG - 250 ML: INTRAVENOUS | Status: DC | PRN

## 2024-09-10 MED ORDER — HYDRALAZINE 20 MG/ML INJECTION SOLUTION
10.0000 mg | Freq: Four times a day (QID) | INTRAMUSCULAR | Status: DC | PRN
  Administered 2024-09-11: 01:00:00 10 mg via INTRAVENOUS
  Filled 2024-09-10: qty 1

## 2024-09-10 NOTE — H&P (Signed)
 [facility]  General History and Physical    [name]  Encounter Start Date:  09/10/2024  Inpatient Admission Date:   Date of Birth:  1966-04-30    PCP: [name] Cobble, NP  Chief Complaint:  Chest tightness started around 2:20 p.m. today when she was getting training session of PD      HPI: [name] is a 58 y.o., White female who presents with chest tightness.  States she is getting trained for home PT.  She was sleeping and woke up with chest tightness, substernal, not associated with shortness of breaths or dizziness.  Now tightness as willing to her left elbow during my evaluation.  History of CAD in July 2024 with PCI to LAD.  Patient follows up with cardiologist at [facility].  Brilinta  discontinued in July.  She is on aspirin .  Troponin minimally elevated likely in the setting of ESRD.  EKG ST-T changes.  Be admitted for observation for chest tightness.    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 difficile diarrhea 12/02/2014    Uncontrolled hypertension 11/30/2014    Obesity 11/30/2014    Cellulitis of foot, left 11/29/2014    Type 2 diabetes mellitus 10/25/2014    Diabetic foot infection (CMS HCC)  (CMS HCC) 10/24/2014       Past Medical History:   Diagnosis Date    Anxiety     Aortic stenosis     Arthritis     hips    Beta blocker  prescribed for left ventricular systolic dysfunction     Labetalol      CAD (coronary artery disease) 03/2023    CAD s/p DES to LAD (03/2023)    Cancer (CMS HCC) 2019, 2021    kidney, Clear Cell Cancer left kidney-S/P partial nephrectomy 2019, then radical nephrectomy 2021    Cataract     S/P cataract extraction bilaterally     Cellulitis 10/24/2014    left foot    Cellulitis 10/21/2020    LLE extremity 09/2020-resolved per patient     CHF (congestive heart failure) 03/2023    EF of 30%    Chronic pain     sec. 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    Constipation     COVID 07/2019    Depression     Diabetes mellitus     Diabetes mellitus, type 2     Diarrhea     Dyspnea on exertion     ESRD (end stage renal disease)     Essential hypertension     Exercise intolerance     Fatty liver     Fluid overload 08/21/21    Recent hospitalization for fluid  overload, HD initiated 10/18/20    Headache(784.0)     Hemodialysis patient (CMS HCC)     HD Monday-Wednesday-Friday    History of kidney disease     CKD-Stage 5-HD initiated 10/18/20    History of nephrectomy 03/24/2020    L nephrectomy    Hx of echocardiogram 10/18/2020    Technically difficult d/t pt. body habitus; Left vent. EF est. 55-60%; mildy calcified aortic vlave leaflets-especially right coroanry cusp; Small mobile density on aortic valve-no aortic valve stenosis or regurg; Concentric remodeling; mild MR; Trace AR; Trileaflet aortic vlave; Nl IVC size wiht <50% insp. collapse;       Hyperlipidemia     Hyperparathyroidism (CMS HCC)     Hyperthyroidism     Old records states hyperthyroidism, patient denies, states has hyperparathyroidism    Irritable bowel syndrome     Macular edema     Morbid obesity (CMS HCC)     Nonrheumatic aortic (valve) stenosis 2024    NSTEMI (non-ST elevated myocardial infarction) (CMS HCC) 03/2023    Obesity     Panic attack     Peripheral edema     ankles/feet    Peripheral neuropathy     bilateral feet     Pneumonia 07/2019; 09/2020    COVID Pneumonia 07/2019; fluid overload/pneumonia with recent hosp. 09/2020    Shortness of breath     SOB after climbing 8 steps     Thyroid  nodule 04/24/2024    Type 2 diabetes mellitus     Wears glasses     reading    White coat hypertension            Past Surgical History:   Procedure Laterality Date    AV FISTULA PLACEMENT Left 08/11/2020    LUE brachio cephalic fistula creation    ECTOPIC PREGNANCY SURGERY  05/24/1998    HX CATARACT REMOVAL Bilateral 2016    HX CHOLECYSTECTOMY  09/10/96    HX HAND SURGERY Right 2017    Trigger finger release 3rd finger    HX OTHER Right 10/18/2020    tunneled dialysis catheter    HX PELVIC LAPAROSCOPY  05/24/98    Ectopic pregnancy    HX SHOULDER SURGERY Left 2017    Procedure for frozen shoulder    HX TUBAL LIGATION  09/10/2012    Filshie Clips    HYMENECTOMY  1996    KIDNEY SURGERY  10/24/17; 03/24/20    LAPAROSCOPIC NEPHRECTOMY, HAND ASSISTED Left 03/24/2020    Radical    LAPAROSCOPIC PARTIAL NEPHRECTOMY Left 10/24/2017    hand-assisted    RENAL BIOPSY, PERCUTANEOUS  03/08/2020    clear cell renal cell carcinoma           Medications Prior to Admission       Prescriptions    albuterol  sulfate (PROVENTIL  OR VENTOLIN  OR PROAIR ) 90 mcg/actuation Inhalation oral inhaler    Take 1-2 Puffs by inhalation Every 6 hours as needed    ALPRAZolam  (XANAX ) 0.25 mg Oral Tablet    Take 1 Tablet (0.25 mg total) by mouth Once per day as needed for Anxiety    aspirin  81 mg Oral Tablet, Chewable    Chew 1 Tablet (81 mg total) Daily    atorvastatin  (LIPITOR) 40 mg Oral Tablet    Take 1 Tablet (40 mg total) by mouth Every evening    carvediloL  (COREG ) 3.125 mg Oral Tablet    Take 1 Tablet (3.125 mg total) by mouth Twice daily with food  cyclobenzaprine  (FLEXERIL ) 5 mg Oral Tablet    Take 1 Tablet (5 mg total) by mouth Three times a day as needed for Muscle spasms    escitalopram  oxalate (LEXAPRO ) 10 mg Oral Tablet    Take 1 Tablet (10 mg total) by mouth Every night     Fish Oil -Omega-3 Fatty Acids 360-1,200 mg Oral Capsule    Take 2 Capsules by mouth Twice daily    fluticasone  (FLONASE ) 50 mcg/actuation Nasal Spray, Suspension    Administer 1 Spray into each nostril Once per day as needed (Allergy symptoms (nasal congestion/ runny nose))    iron sucrose (VENOFER) 50 mg iron/2.5 mL Intravenous Solution    2.5 mL (50 mg total)    loperamide  (IMODIUM ) 2 mg Oral Capsule    Take 2 Capsules (4 mg total) by mouth Every 6 hours as needed (Diarrhea)    loratadine  (CLARITIN ) 10 mg Oral Tablet    Take 1 Tablet (10 mg total) by mouth Once per day as needed (Allergies)    losartan  (COZAAR ) 25 mg Oral Tablet    Take 1 Tablet (25 mg total) by mouth Once a day for 30 days    Patient taking differently:  Take 1 Tablet (25 mg total) by mouth Take in the evening on non-dialysis days (Sun/Tues/Thurs/Sat); does not take on dialysis days    MOUNJARO 7.5 mg/0.5 mL Subcutaneous Pen Injector    Inject 0.5 mL (7.5 mg total) under the skin Every 7 days    nitroGLYCERIN  (NITROSTAT ) 0.4 mg Sublingual Tablet, Sublingual    Place 1 Tablet (0.4 mg total) under the tongue Every 5 minutes as needed for Chest pain for up to 10 doses for 3 doses over 15 minutes    omega-3-DHA-EPA-fish oil  (FISH OIL ) 1,000 mg (120 mg-180 mg) Oral Capsule    Take by mouth    promethazine -dextromethorphan (PHENERGAN -DM) 6.25-15 mg/5 mL Oral Syrup    TAKE 5 ML BY MOUTH 4 TIMES DAILY AS NEEDED FOR COUGH    sevelamer  carbonate (RENVELA ) 800 mg Oral Tablet    Take 2 Tablets (1,600 mg total) by mouth    ticagrelor  (BRILINTA ) 90 mg Oral Tablet    Take 1 tablet by mouth twice daily    TRIPHROCAPS 1 mg Oral Capsule    Take 1 Capsule by mouth Every night    vitamin E 100 unit Oral Capsule    Take 1 Capsule (100 Units total) by mouth Twice daily          acetaminophen  (TYLENOL ) tablet, 650 mg, Oral, Q6H PRN  albuterol  (PROVENTIL ) 2.5 mg / 3 mL (0.083%) neb solution, 2.5 mg, Nebulization, Q4H PRN  ALPRAZolam  (XANAX ) tablet, 0.25 mg, Oral,  Daily PRN  [START ON 09/11/2024] aspirin  chewable tablet 81 mg, 81 mg, Oral, Daily  atorvastatin  (LIPITOR) tablet, 40 mg, Oral, QPM  [START ON 09/11/2024] carvedilol  (COREG ) tablet, 3.125 mg, Oral, 2x/day-Food  D5W 250 mL flush bag, , Intravenous, Q15 Min PRN  enoxaparin  (LOVENOX ) 40 mg/0.4 mL SubQ injection, 40 mg, Subcutaneous, Daily  escitalopram  (LEXAPRO ) tablet, 10 mg, Oral, NIGHTLY  losartan  (COZAAR ) tablet, 25 mg, Oral, Daily  nitroGLYCERIN  (NITROSTAT ) sublingual tablet, 0.4 mg, Sublingual, Q5 Min PRN  NS 250 mL flush bag, , Intravenous, Q15 Min PRN  NS flush syringe, 10 mL, Intravenous, Q8H  NS flush syringe, 10 mL, Intravenous, Q8HRS  NS flush syringe, 10 mL, Intravenous, Q1H PRN  ondansetron  (ZOFRAN ) 2 mg/mL injection, 4 mg, Intravenous, Q8H PRN  [START ON 09/11/2024] sevelamer  carbonate (RENVELA ) tablet, 1,600  mg, Oral, 3x/day-Meals        Allergies[1]  Social History     Socioeconomic History    Marital status: Married     Spouse name: Not on file    Number of children: Not on file    Years of education: Not on file    Highest education level: Not on file   Occupational History    Not on file   Tobacco Use    Smoking status: Never    Smokeless tobacco: Never   Vaping Use    Vaping status: Never Used   Substance and Sexual Activity    Alcohol use: No    Drug use: No    Sexual activity: Yes     Partners: Male     Birth control/protection: Surgical   Other Topics Concern    Ability to Walk 1 Flight of Steps without SOB/CP Not Asked    Routine Exercise Not Asked    Ability to Walk 2 Flight of Steps without SOB/CP Not Asked    Unable to Ambulate Not Asked    Total Care Not Asked    Ability To Do Own ADL's Yes    Uses Walker Not Asked    Other Activity Level Yes     Comment: SOB after climbing approx. 8 steps, sedentary since recent hosp. for fluid overload and CKD    Uses Cane Not Asked   Social History Narrative    Not on file     Social Determinants of Health     Financial Resource Strain: Not on file    Transportation Needs: No Transportation Needs (08/24/2024)    [facility] from [facility] - Transportation     In the past 12 months, has lack of transportation kept you from medical appointments or from getting medications?: No     In the past 12 months, has lack of transportation kept you from meetings, work, or from getting things needed for daily living?: No   Social Connections: Low Risk (01/28/2024)    Social Connections     SDOH Social Isolation: 5 or more times a week   Intimate Partner Violence: Not At Risk (08/24/2024)    Received from [facility]    Humiliation, Afraid, Rape, and Kick questionnaire     Within the last year, have you been afraid of your partner or ex-partner?: No     Within the last year, have you been humiliated or emotionally abused in other ways by your partner or ex-partner?: No     Within the last year, have you been kicked, hit, slapped, or otherwise physically hurt by your partner or ex-partner?: No     Within the last year, have you been raped or forced to have any kind of sexual activity by your partner or ex-partner?: No   Housing Stability: Low Risk  (08/24/2024)    Received from [facility] Stability Vital Sign     In the last 12 months, was there a time when you were not able to pay the mortgage or rent on time?: No     In the past 12 months, how many times have you moved where you were living?: 0     At any time in the past 12 months, were you homeless or living in a shelter (including now)?: No       E-Cigarettes/Vaping    E-Cigarette/Vaping Use Never User      Family  History:     Family Medical History:       Problem Relation (Age of Onset)    Breast Cancer Paternal Aunt    Cancer Maternal Grandmother    Diabetes Father, Paternal Aunt    High Cholesterol Maternal Uncle, Mother    Hypertension (High Blood Pressure) Maternal Uncle, Father              DNR Status:  FULL CODE: ATTEMPT RESUSCITATION/CPR    EXAM:  Temperature: 36.4  C (97.5 F)  Heart Rate: 83  BP (Non-Invasive): (!) 159/75  Respiratory Rate: 12  SpO2: 99 %  General: AAO x3 ; NAD   Eyes: Conjunctiva and sclera clear. Bilateral EOM intact.   HENT: Normocephalic, atraumatic.   Cardiovascular: Regular rate and rhythm. Normal S1, S2. No murmurs, rubs, or gallops.   Respiratory: Clear to auscultation bilaterally. No rales, rhonchi, or wheezes. No use of accessory muscles. Normal work of breathing.   Gastrointestinal:  Soft,  nondistended, nontender  Musculoskeletal: No obvious deformity  Neurological: AAO x 3. No focal deficits. Moving all extremities and following commands.    Integumentary/Skin: No erythema or rash.  Psychiatric: Cooperative with exam. Not agitated. Normal affect.         Assessment/Plan:     Chest tightness got minimally elevated troponin level 28.5 in the setting of END STAGE RENAL DISEASE  Patient states she is under a lot of stress  Reviewed EKG.  Negative for acute ST-T changes.    Will continue home metoprolol  aspirin  Lipitor   Will trend troponin x3  Will get hemoglobin A1c and lipid panel  P.r.n. nitro  Made her NPO  after midnight in the case her troponin actively raises and she will require intervention    ESRD on peritoneal dialysis   Consulted nephrologist     History of diabetes   Ordered hemoglobin AIC.  Ordered sliding scale.    Anxiety.  Ordered him Lexapro  and Xanax .  Recently started on BuSpar .    Full code      Danford Sinks, MD       [1]   Allergies  Allergen Reactions    Gabapentin Itching    Hydrocodone      Ibuprofen      Vicodin [Hydrocodone -Acetaminophen ] Itching

## 2024-09-10 NOTE — ED Nurses Note (Signed)
 Pt completed dialysis today; had on Monday, Tuesday, and today.

## 2024-09-10 NOTE — ED Provider Notes (Signed)
 Dr. Tanda BROCKS. [name]  [facility] Emergency Department Visit Note    Date of Service: 09/10/2024  Primary Care Doctor: Erminio Cobble, NP  Patient information was obtained from patient  History/Exam limitations: none  Patient presented to the Emergency Department by ambualnce    Chief Complaint: Shortness of Breath    HPI:  The patient is a/an 58 y.o. female who presents to the Emergency Department via EMS with shortness of breath and increased anxiety that began while setting up home dialysis. She reports associated chest tightness that initially started on the left side of her chest and has since become midsternal. She describes the pain as severe, rating it 8/10, and states that it feels similar to a prior myocardial infarction she experienced last year. She also reports associated nausea. Her past medical history is significant for kidney disease, type 2 diabetes mellitus, hyperlipidemia, coronary artery disease, myocardial infarction, and congestive heart failure. She is currently taking aspirin . She denies fever, cough, vomiting, dizziness, syncope, or other acute complaints.    Review of Systems:  The pertinent positive and negative symptoms are as per the HPI. They are otherwise negative as reviewed.    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    Constipation     COVID 07/2019    Depression     Diabetes mellitus     Diabetes mellitus, type 2     Diarrhea     Dyspnea on exertion     ESRD (end stage renal disease)     Essential hypertension     Exercise intolerance     Fatty liver     Fluid overload 08/21/21    Recent hospitalization for fluid overload, HD initiated 10/18/20    Headache(784.0)     Hemodialysis patient (CMS HCC)     HD Monday-Wednesday-Friday    History of kidney disease     CKD-Stage 5-HD initiated 10/18/20    History of nephrectomy 03/24/2020    L nephrectomy    Hx of echocardiogram 10/18/2020    Technically difficult d/t pt. body habitus; Left vent. EF est. 55-60%; mildy calcified aortic vlave leaflets-especially right coroanry cusp; Small mobile density on aortic valve-no aortic valve stenosis or regurg; Concentric remodeling; mild MR; Trace AR; Trileaflet aortic vlave; Nl IVC size wiht <50% insp. collapse;       Hyperlipidemia     Hyperparathyroidism (CMS HCC)     Hyperthyroidism     Old records states hyperthyroidism, patient denies, states has hyperparathyroidism    Irritable bowel syndrome     Macular  edema     Morbid obesity (CMS HCC)     Nonrheumatic aortic (valve) stenosis 2024    NSTEMI (non-ST elevated myocardial infarction) (CMS HCC) 03/2023    Obesity     Panic attack     Peripheral edema     ankles/feet    Peripheral neuropathy     bilateral feet    Pneumonia 07/2019; 09/2020    COVID Pneumonia 07/2019; fluid overload/pneumonia with recent hosp. 09/2020    Shortness of breath     SOB after climbing 8 steps     Thyroid  nodule 04/24/2024    Type 2 diabetes mellitus     Wears glasses     reading    White coat hypertension            Past Surgical History:  Past Surgical History:   Procedure Laterality Date    AV FISTULA PLACEMENT Left 08/11/2020    LUE brachio cephalic fistula creation    ECTOPIC PREGNANCY SURGERY  05/24/1998    HX CATARACT REMOVAL Bilateral 2016    HX CHOLECYSTECTOMY  09/10/96    HX HAND SURGERY  Right 2017    Trigger finger release 3rd finger    HX OTHER Right 10/18/2020    tunneled dialysis catheter    HX PELVIC LAPAROSCOPY  05/24/98    Ectopic pregnancy    HX SHOULDER SURGERY Left 2017    Procedure for frozen shoulder    HX TUBAL LIGATION  09/10/2012    Filshie Clips    HYMENECTOMY  1996    KIDNEY SURGERY  10/24/17; 03/24/20    LAPAROSCOPIC NEPHRECTOMY, HAND ASSISTED Left 03/24/2020    Radical    LAPAROSCOPIC PARTIAL NEPHRECTOMY Left 10/24/2017    hand-assisted    RENAL BIOPSY, PERCUTANEOUS  03/08/2020    clear cell renal cell carcinoma           Social History:  Social History[1]    Social History     Substance and Sexual Activity   Drug Use No       Current Outpatient Medications:   Previous Medications    ALBUTEROL  SULFATE (PROVENTIL  OR VENTOLIN  OR PROAIR ) 90 MCG/ACTUATION INHALATION ORAL INHALER    Take 1-2 Puffs by inhalation Every 6 hours as needed    ALPRAZOLAM  (XANAX ) 0.25 MG ORAL TABLET    Take 1 Tablet (0.25 mg total) by mouth Once per day as needed for Anxiety    ASPIRIN  81 MG ORAL TABLET, CHEWABLE    Chew 1 Tablet (81 mg total) Daily    ATORVASTATIN  (LIPITOR) 40 MG ORAL TABLET    Take 1 Tablet (40 mg total) by mouth Every evening    CARVEDILOL  (COREG ) 3.125 MG ORAL TABLET    Take 1 Tablet (3.125 mg total) by mouth Twice daily with food    CYCLOBENZAPRINE  (FLEXERIL ) 5 MG ORAL TABLET    Take 1 Tablet (5 mg total) by mouth Three times a day as needed for Muscle spasms    ESCITALOPRAM  OXALATE (LEXAPRO ) 10 MG ORAL TABLET    Take 1 Tablet (10 mg total) by mouth Every night    FISH OIL -OMEGA-3 FATTY ACIDS 360-1,200 MG ORAL CAPSULE    Take 2 Capsules by mouth Twice daily    FLUTICASONE  (FLONASE ) 50 MCG/ACTUATION NASAL SPRAY, SUSPENSION    Administer 1 Spray into each nostril Once per day as needed (Allergy symptoms (nasal congestion/ runny nose))    IRON SUCROSE (VENOFER) 50 MG IRON/2.5 ML INTRAVENOUS SOLUTION    2.5 mL (50  mg total)    LOPERAMIDE  (IMODIUM ) 2 MG ORAL CAPSULE    Take 2 Capsules (4 mg  total) by mouth Every 6 hours as needed (Diarrhea)    LORATADINE  (CLARITIN ) 10 MG ORAL TABLET    Take 1 Tablet (10 mg total) by mouth Once per day as needed (Allergies)    LOSARTAN  (COZAAR ) 25 MG ORAL TABLET    Take 1 Tablet (25 mg total) by mouth Once a day for 30 days    MOUNJARO 7.5 MG/0.5 ML SUBCUTANEOUS PEN INJECTOR    Inject 0.5 mL (7.5 mg total) under the skin Every 7 days    NITROGLYCERIN  (NITROSTAT ) 0.4 MG SUBLINGUAL TABLET, SUBLINGUAL    Place 1 Tablet (0.4 mg total) under the tongue Every 5 minutes as needed for Chest pain for up to 10 doses for 3 doses over 15 minutes    OMEGA-3-DHA-EPA-FISH OIL  (FISH OIL ) 1,000 MG (120 MG-180 MG) ORAL CAPSULE    Take by mouth    PROMETHAZINE -DEXTROMETHORPHAN (PHENERGAN -DM) 6.25-15 MG/5 ML ORAL SYRUP    TAKE 5 ML BY MOUTH 4 TIMES DAILY AS NEEDED FOR COUGH    SEVELAMER  CARBONATE (RENVELA ) 800 MG ORAL TABLET    Take 2 Tablets (1,600 mg total) by mouth    TICAGRELOR  (BRILINTA ) 90 MG ORAL TABLET    Take 1 tablet by mouth twice daily    TRIPHROCAPS 1 MG ORAL CAPSULE    Take 1 Capsule by mouth Every night    VITAMIN E 100 UNIT ORAL CAPSULE    Take 1 Capsule (100 Units total) by mouth Twice daily       Allergies:   Allergies[2]    Physical Exam     Vital Signs:  Filed Vitals:    09/10/24 1529   BP: (!) 191/82   Pulse: 86   Resp: 16   Temp: 36.4 C (97.5 F)   SpO2: 100%       The initial visit vital signs are reviewed as above.     Constitutional: The patient is alert and oriented to person, place and time.  The patient is appropriately interactive with a nontoxic and non-ill appearance.  The patient appears in no distress and is resting comfortably in the gurney.  ENT: Atraumatic, normocephalic head, mucous membranes moist, TM's Clear, Nares unremarkable, Posterior Oropharynx is unremarkable, Trachea is midline without stridor.  Neck: No JVD or thyromegaly or lymphadenopathy, supple.  Lungs: Clear to auscultation bilaterally. With a normal inspiratory:expiratory ratio. No  respiratory distress.  Cardiovascular: Heart is S1-S2 regular rate and rhythm without murmur click gallop or rub.  Abdomen: Soft, non-tender, non-distended without evidence of rebound or guarding.  Skin: No cyanosis, jaundice, rash or lesion.  Neurologic: normal facial symmetry and speech, 5/5 upper and lower extremity strength, 2/4 patellar DTR's and intact light touch, pressure and pain sensation.   Vascular: Normal peripheral pulses with brisk capillary refills of less than 2 seconds.    ED Course     Old records reviewed and summarized:  Emergency department triage notes are reviewed.       Initial Summary:  15:58 Patient is a 58 y.o. female who presents to the ED with the chief complaint of shortness of breath.     Initial orders placed:   Orders Placed This Encounter    XR AP MOBILE CHEST (If patient condition warrants)    CBC/DIFF    COMPREHENSIVE METABOLIC PANEL, NON-FASTING    TROPONIN-I    CBC WITH DIFF    ECG 12-LEAD (  Take to provider with a brief history)    INSERT & MAINTAIN PERIPHERAL IV ACCESS    NS flush syringe       Work-up:    Labs:    Results for orders placed or performed during the hospital encounter of 09/10/24   COMPREHENSIVE METABOLIC PANEL, NON-FASTING   Result Value Ref Range    SODIUM 143 136 - 145 mmol/L    POTASSIUM 4.6 3.5 - 5.1 mmol/L    CHLORIDE 107 96 - 111 mmol/L    CO2 TOTAL 24 22 - 30 mmol/L    ANION GAP 12 4 - 13 mmol/L    BUN 40 (H) 8 - 25 mg/dL    CREATININE 3.28 (H) 0.60 - 1.05 mg/dL    BUN/CREA RATIO 6 6 - 22    eGFRcr - FEMALE 7 (L) >=60 mL/min/1.26m2    ALBUMIN  3.7 3.5 - 5.0 g/dL    CALCIUM  10.1 8.6 - 10.2 mg/dL    GLUCOSE 874 65 - 874 mg/dL    ALKALINE PHOSPHATASE 70 50 - 130 U/L    ALT (SGPT) 27 <31 U/L    AST (SGOT)  28 11 - 34 U/L    BILIRUBIN TOTAL 0.6 0.3 - 1.3 mg/dL    PROTEIN TOTAL 7.5 6.4 - 8.3 g/dL   TROPONIN-I   Result Value Ref Range    TROPONIN-I HS 28.5 (HH) <=14.0 ng/L   CBC WITH DIFF   Result Value Ref Range    WBC 4.3 3.7 - 11.0 x103/uL    RBC 3.43 (L)  3.85 - 5.22 x106/uL    HGB 11.6 11.5 - 16.0 g/dL    HCT 64.2 65.1 - 53.9 %    MCV 104.1 (H) 78.0 - 100.0 fL    MCH 33.8 (H) 26.0 - 32.0 pg    MCHC 32.5 31.0 - 35.5 g/dL    RDW-CV 82.3 (H) 88.4 - 15.5 %    PLATELETS 213 150 - 400 x103/uL    MPV 9.7 8.7 - 12.5 fL    NEUTROPHIL % 63.8 %    LYMPHOCYTE % 24.5 %    MONOCYTE % 8.6 %    EOSINOPHIL % 2.6 %    BASOPHIL % 0.5 %    NEUTROPHIL # 2.74 1.50 - 7.70 x103/uL    LYMPHOCYTE # 1.05 1.00 - 4.80 x103/uL    MONOCYTE # 0.37 0.20 - 1.10 x103/uL    EOSINOPHIL # 0.11 <=0.50 x103/uL    BASOPHIL # <0.10 <=0.20 x103/uL    IMMATURE GRANULOCYTE % 0.0 0.0 - 1.0 %    IMMATURE GRANULOCYTE # <0.10 <0.10 x103/uL   ECG 12-LEAD (Take to provider with a brief history)   Result Value Ref Range    Ventricular rate 83 BPM    Atrial Rate 83 BPM    PR Interval 126 ms    QRS Duration 78 ms    QT Interval 392 ms    QTC Calculation 460 ms    Calculated P Axis 10 degrees    Calculated R Axis -2 degrees    Calculated T Axis 47 degrees       EKG interpretation:  12-Lead EKG interpreted by me reveals rate of 83 bpm, Demonstrates normal sinus rhythm with septal infarct, age undetermined. Abnormal EKG. .     Radiology:  XR AP MOBILE CHEST (If patient condition warrants)   Final Result   No acute cardiopulmonary disease.      SABRA  Radiologist location ID: [id number]           Interpreted by radiologist and independently reviewed by me.    ED Course/Interpretation    ED Course as of 09/10/24 1722   Wed Sep 10, 2024   1536 Based on the patient's clinical presentation and physical examination findings, chest pain is similar to her prior bouts with acute coronary syndrome.  Patient will be given aspirin  and nitroglycerin  while we perform a CCU evaluation.  Chest x-ray demonstrates no evidence of acute process.  She understood and agreed to the treatment plan at present.   1652 CBC reveals a normal white blood cell count, hemoglobin/hematocrit and platelet count.  Comprehensive metabolic  profile shows an elevated BUN and creatinine as expected.  Troponin is slightly elevated at 28.5.  It is unclear as to whether not this is reflection of the patient's acute coronary syndrome or if it is related to the patient's renal disease.  Hospitalist service paged   [phone] I had an extensive conversation with the patient regarding the laboratory results and imaging.  Patient understands the need for admission discuss case with the hospitalist service.   1721 Dr. Brendan, of the hospitalist service, return my call and agreed to admit the patient for further evaluation and management.       Final Decision Making:    Pre-Disposition Vitals:  Filed Vitals:    09/10/24 1529   BP: (!) 191/82   Pulse: 86   Resp: 16   Temp: 36.4 C (97.5 F)   SpO2: 100%       Coding Level Tool    Medical Decision Making  Problems Addressed:  Chest pain, unspecified type: acute illness or injury    Amount and/or Complexity of Data Reviewed  Labs: ordered.  Radiology: ordered.  ECG/medicine tests: ordered.    Risk  OTC drugs.  Prescription drug management.  Decision regarding hospitalization.        Admitted     Condition on Disposition: Fair      I Carolyn Plain, SCRIBE scribed for [name] Ingle, DO.     Documentation assistance provided for [name] Ingle, DO  by Kaitlyn Rader, SCRIBE. Information recorded by the scribe was done at my direction and has been reviewed and validated by [name] Ingle, DO.          [1]   Social History  Tobacco Use    Smoking status: Never    Smokeless tobacco: Never   Vaping Use    Vaping status: Never Used   Substance Use Topics    Alcohol use: No    Drug use: No   [2]   Allergies  Allergen Reactions    Gabapentin Itching    Hydrocodone      Ibuprofen      Vicodin [Hydrocodone -Acetaminophen ] Itching

## 2024-09-11 ENCOUNTER — Inpatient Hospital Stay (HOSPITAL_COMMUNITY)

## 2024-09-11 DIAGNOSIS — R9431 Abnormal electrocardiogram [ECG] [EKG]: Secondary | ICD-10-CM

## 2024-09-11 DIAGNOSIS — Z992 Dependence on renal dialysis: Secondary | ICD-10-CM

## 2024-09-11 DIAGNOSIS — Z7985 Long-term (current) use of injectable non-insulin antidiabetic drugs: Secondary | ICD-10-CM

## 2024-09-11 DIAGNOSIS — I132 Hypertensive heart and chronic kidney disease with heart failure and with stage 5 chronic kidney disease, or end stage renal disease: Secondary | ICD-10-CM

## 2024-09-11 DIAGNOSIS — Z7982 Long term (current) use of aspirin: Secondary | ICD-10-CM

## 2024-09-11 DIAGNOSIS — I5030 Unspecified diastolic (congestive) heart failure: Secondary | ICD-10-CM

## 2024-09-11 DIAGNOSIS — Z79899 Other long term (current) drug therapy: Secondary | ICD-10-CM

## 2024-09-11 DIAGNOSIS — I214 Non-ST elevation (NSTEMI) myocardial infarction: Secondary | ICD-10-CM

## 2024-09-11 DIAGNOSIS — I061 Rheumatic aortic insufficiency: Secondary | ICD-10-CM

## 2024-09-11 DIAGNOSIS — I350 Nonrheumatic aortic (valve) stenosis: Secondary | ICD-10-CM

## 2024-09-11 DIAGNOSIS — E785 Hyperlipidemia, unspecified: Secondary | ICD-10-CM

## 2024-09-11 LAB — ECG 12-LEAD
Atrial Rate: 83 {beats}/min
Calculated P Axis: 10 degrees
Calculated R Axis: -2 degrees
Calculated T Axis: 47 degrees
PR Interval: 126 ms
QRS Duration: 78 ms
QT Interval: 392 ms
QTC Calculation: 460 ms
Ventricular rate: 83 {beats}/min

## 2024-09-11 LAB — PTT (PARTIAL THROMBOPLASTIN TIME)
APTT: 31.7 s (ref 24.0–36.5)
APTT: 42.4 s — ABNORMAL HIGH (ref 24.0–36.5)
APTT: 50.6 s — ABNORMAL HIGH (ref 24.0–36.5)
APTT: 35.5 s (ref 24.0–36.5)

## 2024-09-11 LAB — CBC
HCT: 32.7 % — ABNORMAL LOW (ref 34.8–46.0)
HGB: 10.7 g/dL — ABNORMAL LOW (ref 11.5–16.0)
MCH: 33.6 pg — ABNORMAL HIGH (ref 26.0–32.0)
MCHC: 32.7 g/dL (ref 31.0–35.5)
MCV: 102.8 fL — ABNORMAL HIGH (ref 78.0–100.0)
MPV: 10.1 fL (ref 8.7–12.5)
PLATELETS: 237 x10ˆ3/uL (ref 150–400)
RBC: 3.18 x10ˆ6/uL — ABNORMAL LOW (ref 3.85–5.22)
RDW-CV: 17.5 % — ABNORMAL HIGH (ref 11.5–15.5)
WBC: 5.5 x10ˆ3/uL (ref 3.7–11.0)

## 2024-09-11 LAB — POC FINGERSTICK GLUCOSE - BMC/JMC (RESULTS)
GLUCOSE, POC: 94 mg/dL (ref 60–100)
GLUCOSE, POC: 110 mg/dL — ABNORMAL HIGH (ref 60–100)
GLUCOSE, POC: 160 mg/dL — ABNORMAL HIGH (ref 60–100)

## 2024-09-11 LAB — HGA1C (HEMOGLOBIN A1C WITH EST AVG GLUCOSE)
ESTIMATED AVERAGE GLUCOSE: 123 mg/dL
HEMOGLOBIN A1C: 5.9 % — ABNORMAL HIGH (ref 4.0–5.6)

## 2024-09-11 LAB — TROPONIN-I
TROPONIN-I HS: 1173.2 ng/L (ref <=14.0)
TROPONIN-I HS: 2042.6 ng/L (ref <=14.0)
TROPONIN-I HS: 2349.3 ng/L (ref <=14.0)
TROPONIN-I HS: 2295.5 ng/L (ref <=14.0)

## 2024-09-11 LAB — TRANSTHORACIC ECHOCARDIOGRAM - ADULT
EF VISUAL ESTIMATE: 60
EF: 65

## 2024-09-11 MED ORDER — NYSTATIN 100,000 UNIT/GRAM TOPICAL POWDER
Freq: Two times a day (BID) | CUTANEOUS | Status: DC
  Filled 2024-09-11: qty 15

## 2024-09-11 MED ORDER — HEPARIN (PORCINE) 25,000 UNIT/250 ML IN 0.45 % SODIUM CHLORIDE IV SOLN
11.8000 [IU]/kg/h | INTRAVENOUS | Status: DC
  Administered 2024-09-11: 09:00:00 13.8 [IU]/kg/h via INTRAVENOUS
  Administered 2024-09-11 (×2): 11.8 [IU]/kg/h via INTRAVENOUS
  Administered 2024-09-11: 19:00:00 13.8 [IU]/kg/h via INTRAVENOUS
  Administered 2024-09-12: 0 [IU]/kg/h via INTRAVENOUS
  Administered 2024-09-12: 17.8 [IU]/kg/h via INTRAVENOUS
  Administered 2024-09-12: 0 [IU]/kg/h via INTRAVENOUS
  Administered 2024-09-12: 17.8 [IU]/kg/h via INTRAVENOUS
  Filled 2024-09-11 (×2): qty 250

## 2024-09-11 MED ORDER — PERITONEAL DIALYSIS SOLN 7-2.5 % DEXT.LOW CALC 2.5 MEQ/L-MAG 0.5 MEQ/L
2.0000 L | Freq: Four times a day (QID) | INTRAPERITONEAL | Status: DC
  Administered 2024-09-11 – 2024-09-12 (×5): 2000 mL via INTRAPERITONEAL
  Filled 2024-09-11 (×10): qty 2500

## 2024-09-11 MED ORDER — SODIUM CHLORIDE 0.9 % INJECTION SOLUTION
2.0000 mL | INTRAVENOUS | Status: AC
  Administered 2024-09-11: 11:00:00 2 mL via INTRAVENOUS

## 2024-09-11 NOTE — Progress Notes (Addendum)
 Notify of troponin of over 1000.  Previously was 28.  Asymptomatic to chest pain.  Started on heparin  drip.  Cardiology consulted.    Echo ordered.

## 2024-09-11 NOTE — Consults (Signed)
 [facility]  Nephrology Consult Follow Up Note      Date of service: 09/11/2024  Hospital Day:  LOS: 0 days   Encounter Start Date: 09/10/2024  Inpatient Admission Date: 09/11/2024    SUBJECTIVE: END STAGE RENAL DISEASE on hemodialysis through last week then started PD training this past Monday. Other problems include coronary artery disease, HTN, anemia secondary to CKD, and secondary hyperparathyroidism. Yesterday developed chest and left arm symptoms, came to ED, and was diagnosed with NSTEMI. I was contacted this morning and ordered PD.     OBJECTIVE:    Vital Signs:  Temp (24hrs) Max:37.1 C (98.7 F)      Temperature: 36.3 C (97.3 F)  BP (Non-Invasive): (!) 159/91  MAP (Non-Invasive): 111 mmHG  Heart Rate: 76  Respiratory Rate: 19  SpO2: 99 %    Systolic (24hrs), Avg:164 , Min:93 , Max:197     Diastolic (24hrs), Avg:83, Min:68, Max:101    MAP:  MAP (Non-Invasive)  Avg: 106.4 mmHG  Min: 79 mmHG  Max: 121 mmHG  Heart Rate:  Pulse  Avg: 84.1  Min: 76  Max: 94    Dialysis treatment total fluid removal:     Post dialysis weight:       Diet Order:  DIET NPO - SPECIFIC DATE & TIME EXCEPT CARDIAC MEDS WITH SIP OF WATER   Nutrition:    Orders Placed This Encounter   No orders of the following type(s) were placed in this encounter: Nourishments.     IV Fluids, Meds, and Drips: heparin , Last Rate: 13.8 Units/kg/hr (09/11/24 0857)        I/O:  I/O last 24 hours:    Intake/Output Summary (Last 24 hours) at 09/11/2024 1202  Last data filed at 09/11/2024 0828  Gross per 24 hour   Intake 24 ml   Output -2500 ml   Net 2524 ml     I/O current shift:  12/18 0700 - 12/18 1859  In: 0   Out: -2500   I/O last 3 completed shifts:  In: 24 [I.V.:24]  Out: -     Labs:  I have reviewed all lab results.    Imaging:  N/A    Current Medications:  2.5% dextrose  (low calcium ) peritoneal dialysis PREMIX solution - NO additives, 2 L, Peritoneal catheter, Q6HRS  acetaminophen  (TYLENOL ) tablet, 650 mg, Oral, Q6H PRN  albuterol   (PROVENTIL ) 2.5 mg / 3 mL (0.083%) neb solution, 2.5 mg, Nebulization, Q4H PRN  ALPRAZolam  (XANAX ) tablet, 0.25 mg, Oral, Daily PRN  aspirin  chewable tablet 81 mg, 81 mg, Oral, Daily  atorvastatin  (LIPITOR) tablet, 40 mg, Oral, QPM  busPIRone  (BUSPAR ) tablet, 5 mg, Oral, 2x/day  Correction/SSIP insulin  lispro 100 units/mL injection pen, 1-5 Units, Subcutaneous, 4x/day AC  D5W 250 mL flush bag, , Intravenous, Q15 Min PRN  dextrose  (GLUTOSE) 40% oral gel, 15 g, Oral, Q15 Min PRN  dextrose  50% (0.5 g/mL) injection - syringe, 12.5 g, Intravenous, Q15 Min PRN  escitalopram  (LEXAPRO ) tablet, 10 mg, Oral, NIGHTLY  glucagon  injection 1 mg, 1 mg, IntraMUSCULAR, Once PRN  heparin  25,000 units in 1/2 NS 250 mL infusion, 11.8 Units/kg/hr (Adjusted), Intravenous, Continuous  hydrALAZINE  (APRESOLINE ) injection 10 mg, 10 mg, Intravenous, Q6H PRN  losartan  (COZAAR ) tablet, 25 mg, Oral, Daily  metoprolol  succinate (TOPROL -XL) 24 hr extended release tablet, 25 mg, Oral, Daily  nitroGLYCERIN  (NITROSTAT ) sublingual tablet, 0.4 mg, Sublingual, Q5 Min PRN  NS 250 mL flush bag, , Intravenous, Q15 Min PRN  NS flush syringe, 10 mL,  Intravenous, Q8H  NS flush syringe, 10 mL, Intravenous, Q8HRS  NS flush syringe, 10 mL, Intravenous, Q1H PRN  ondansetron  (ZOFRAN ) 2 mg/mL injection, 4 mg, Intravenous, Q8H PRN  sevelamer  carbonate (RENVELA ) tablet, 1,600 mg, Oral, 3x/day-Meals        Physical Exam:  Constitutional:  no distress  Respiratory:  Breathing nonlabored  Cardiovascular:  regular rate and rhythm on the monitor  Neurologic:  No asterixis    Impression/Recommendations:  END STAGE RENAL DISEASE recently converted from HD to PD  HTN  Anemia secondary to CKD with Hgb at goal   Secondary hyperparathyroidism s/p surgical parathyroidectomy.   Will do CAPD exchanges every 6 hours while she is hospitalized, and monitor patient with you.     Deward Ford, MD

## 2024-09-11 NOTE — Care Plan (Signed)
 Goal Outcome Evaluation:     Anxieties, Fears or Concerns: fearfull of heart attack (09/11/24 0845)  Individualized Care Needs: Peritoneal dialysis (09/11/24 0845)  Patient-Specific Goals (Include Timeframe): figure out why the chest pain (09/11/24 0845)  Plan of Care Reviewed With: patient (09/11/24 0845)     Patient Progress: improving    Patient's Troponin is trending down.     Problem: Adult Inpatient Plan of Care  Goal: Absence of Hospital-Acquired Illness or Injury  Outcome: Ongoing (see interventions/notes)  Intervention: Identify and Manage Fall Risk  Recent Flowsheet Documentation  Taken 09/11/2024 0845 by Rosina FALCON, RN  Safety Promotion/Fall Prevention:   fall prevention program maintained   nonskid shoes/slippers when out of bed   safety round/check completed   activity supervised  Intervention: Prevent Skin Injury  Recent Flowsheet Documentation  Taken 09/11/2024 0845 by Rosina FALCON, RN  Body Position: supine, head elevated  Intervention: Prevent and Manage VTE (Venous Thromboembolism) Risk  Recent Flowsheet Documentation  Taken 09/11/2024 0845 by Rosina FALCON, RN  VTE Prevention/Management:   dorsiflexion/plantar flexion performed   anticoagulant therapy maintained  Intervention: Prevent Infection  Recent Flowsheet Documentation  Taken 09/11/2024 0845 by Rosina FALCON, RN  Infection Prevention:   glycemic control managed   rest/sleep promoted  Goal: Optimal Comfort and Wellbeing  Outcome: Ongoing (see interventions/notes)  Intervention: Provide Person-Centered Care  Recent Flowsheet Documentation  Taken 09/11/2024 0845 by Rosina FALCON, RN  Trust Relationship/Rapport:   care explained   questions answered   reassurance provided   thoughts/feelings acknowledged  Goal: Rounds/Family Conference  Outcome: Ongoing (see interventions/notes)     Problem: Gas Exchange Impaired  Goal: Optimal Gas Exchange  Outcome: Ongoing (see interventions/notes)  Intervention: Optimize Oxygenation and Ventilation  Recent Flowsheet  Documentation  Taken 09/11/2024 0845 by Rosina FALCON, RN  Head of Bed [facility]) Positioning: HOB at 45 degrees     Problem: Fall Injury Risk  Goal: Absence of Fall and Fall-Related Injury  Outcome: Ongoing (see interventions/notes)  Intervention: Promote Injury-Free Environment  Recent Flowsheet Documentation  Taken 09/11/2024 0845 by Rosina FALCON, RN  Safety Promotion/Fall Prevention:   fall prevention program maintained   nonskid shoes/slippers when out of bed   safety round/check completed   activity supervised     Problem: Pain Acute  Goal: Optimal Pain Control and Function  Outcome: Ongoing (see interventions/notes)  Intervention: Optimize Psychosocial Wellbeing  Recent Flowsheet Documentation  Taken 09/11/2024 0845 by Rosina FALCON, RN  Diversional Activities:   smartphone   television     Problem: Skin Injury Risk Increased  Goal: Skin Health and Integrity  Outcome: Ongoing (see interventions/notes)  Intervention: Optimize Skin Protection  Recent Flowsheet Documentation  Taken 09/11/2024 0845 by Rosina FALCON, RN  Activity Management:   ambulated in room   ambulated to bathroom  Head of Bed (HOB) Positioning: HOB at 45 degrees

## 2024-09-11 NOTE — Ancillary Notes (Signed)
 Echo completed at bedside. Definity  given and administered by myself.      Fleming-Neon, RDCS  09/11/2024, 11:26

## 2024-09-11 NOTE — Consults (Signed)
 [facility]    [facility]                                                                                                    Consultation Note    Date/Time: 09/11/2024 14:11  Patient Name: [name]  MRN#: [id number]  DOB: Feb 20, 1966  Admission Date: 09/10/2024   Consulting Physician: Brendan Scrape, MD  Consulting Cardiologist:Dr. Leita Chancy    Reason for Consult:   The patient was seen at the request of Brendan Scrape, MD for the evaluation of NSTEMI     History:   [name] is a 58 y.o. White female with history of AS, Coronary Artery Disease, END STAGE RENAL DISEASE, HTN, HLD, DM who presents with shortness of breath and chest tightness.  Patient has been learning how to do home PD.  She reports that she had fallen asleep during dialysis and had awoken with burning eyes and dry mouth.  She stated that afterward she had some chest tightness that started on the left side and traveled midsternally.  She had described it as a pressure and tightness along with nausea.  She is currently being followed by [facility] for aortic stenosis.  She did have a cardiac catheterization performed last July with PCI to proximal LAD.     Troponins 2,349, now downward trending. No acute EKG changes noted.   K4.6, CR 6.71 patient had also been hypertensive on admission, 190/80s.  Patient currently denies any chest pain, shortness of breath, palpitations.    Echo showing no wall motion abnormalities, EF 60- 65%.  Aortic valve has progressed to severe stenosis.  Gradient measured at 44.  She is followed by [facility] and has an upcoming appointment with them on 10/08/2024.     BP 130s-150s/60s-90's HR 80s    Review of Systems:     As per HPI, all other systems noncontributory.     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    Constipation     COVID 07/2019    Depression     Diabetes mellitus     Diabetes mellitus, type 2  Diarrhea     Dyspnea on exertion     ESRD (end stage renal disease)     Essential hypertension     Exercise intolerance     Fatty liver     Fluid overload 08/21/21    Recent hospitalization for fluid overload, HD initiated 10/18/20    Headache(784.0)     Hemodialysis patient (CMS HCC)     HD Monday-Wednesday-Friday    History of kidney disease     CKD-Stage 5-HD initiated 10/18/20    History of nephrectomy 03/24/2020    L nephrectomy    Hx of echocardiogram 10/18/2020    Technically difficult d/t pt. body habitus; Left vent. EF est. 55-60%; mildy calcified aortic vlave leaflets-especially right coroanry cusp; Small mobile density on aortic valve-no aortic valve stenosis or regurg; Concentric remodeling; mild MR; Trace AR; Trileaflet aortic vlave; Nl IVC size wiht <50% insp. collapse;       Hyperlipidemia     Hyperparathyroidism (CMS HCC)     Hyperthyroidism     Old records states hyperthyroidism, patient denies, states has hyperparathyroidism    Irritable bowel syndrome     Macular edema     Morbid obesity (CMS HCC)     Nonrheumatic aortic (valve) stenosis 2024    NSTEMI (non-ST elevated myocardial infarction) (CMS HCC) 03/2023    Obesity     Panic attack     Peripheral edema     ankles/feet    Peripheral neuropathy     bilateral feet    Pneumonia 07/2019; 09/2020    COVID Pneumonia 07/2019; fluid  overload/pneumonia with recent hosp. 09/2020    Shortness of breath     SOB after climbing 8 steps     Thyroid  nodule 04/24/2024    Type 2 diabetes mellitus     Wears glasses     reading    White coat hypertension            Past Surgical History:     Past Surgical History:   Procedure Laterality Date    AV FISTULA PLACEMENT Left 08/11/2020    LUE brachio cephalic fistula creation    ECTOPIC PREGNANCY SURGERY  05/24/1998    HX CATARACT REMOVAL Bilateral 2016    HX CHOLECYSTECTOMY  09/10/96    HX HAND SURGERY Right 2017    Trigger finger release 3rd finger    HX OTHER Right 10/18/2020    tunneled dialysis catheter    HX PELVIC LAPAROSCOPY  05/24/98    Ectopic pregnancy    HX SHOULDER SURGERY Left 2017    Procedure for frozen shoulder    HX TUBAL LIGATION  09/10/2012    Filshie Clips    HYMENECTOMY  1996    KIDNEY SURGERY  10/24/17; 03/24/20    LAPAROSCOPIC NEPHRECTOMY, HAND ASSISTED Left 03/24/2020    Radical    LAPAROSCOPIC PARTIAL NEPHRECTOMY Left 10/24/2017    hand-assisted    RENAL BIOPSY, PERCUTANEOUS  03/08/2020    clear cell renal cell carcinoma           Allergies:   Allergies[1]    Medications:     Current Facility-Administered Medications   Medication Dose Route Frequency    2.5% dextrose  (low calcium ) peritoneal dialysis PREMIX solution - NO additives  2 L Peritoneal catheter Q6HRS    acetaminophen  (TYLENOL ) tablet  650 mg Oral Q6H PRN    albuterol  (PROVENTIL ) 2.5 mg / 3 mL (0.083%) neb solution  2.5 mg Nebulization Q4H PRN    ALPRAZolam  (XANAX )  tablet  0.25 mg Oral Daily PRN    aspirin  chewable tablet 81 mg  81 mg Oral Daily    atorvastatin  (LIPITOR) tablet  40 mg Oral QPM    busPIRone  (BUSPAR ) tablet  5 mg Oral 2x/day    Correction/SSIP insulin  lispro 100 units/mL injection pen  1-5 Units Subcutaneous 4x/day AC    D5W 250 mL flush bag   Intravenous Q15 Min PRN    dextrose  (GLUTOSE) 40% oral gel  15 g Oral Q15 Min PRN    dextrose  50% (0.5 g/mL) injection - syringe  12.5 g Intravenous Q15 Min PRN     escitalopram  (LEXAPRO ) tablet  10 mg Oral NIGHTLY    glucagon  injection 1 mg  1 mg IntraMUSCULAR Once PRN    heparin  25,000 units in 1/2 NS 250 mL infusion  11.8 Units/kg/hr (Adjusted) Intravenous Continuous    hydrALAZINE  (APRESOLINE ) injection 10 mg  10 mg Intravenous Q6H PRN    losartan  (COZAAR ) tablet  25 mg Oral Daily    metoprolol  succinate (TOPROL -XL) 24 hr extended release tablet  25 mg Oral Daily    nitroGLYCERIN  (NITROSTAT ) sublingual tablet  0.4 mg Sublingual Q5 Min PRN    NS 250 mL flush bag   Intravenous Q15 Min PRN    NS flush syringe  10 mL Intravenous Q8H    NS flush syringe  10 mL Intravenous Q8HRS    NS flush syringe  10 mL Intravenous Q1H PRN    ondansetron  (ZOFRAN ) 2 mg/mL injection  4 mg Intravenous Q8H PRN    sevelamer  carbonate (RENVELA ) tablet  1,600 mg Oral 3x/day-Meals     Prior to Admission medications   Medication Sig Start Date End Date Taking? Authorizing Provider   albuterol  sulfate (PROVENTIL  OR VENTOLIN  OR PROAIR ) 90 mcg/actuation Inhalation oral inhaler Take 1-2 Puffs by inhalation Every 6 hours as needed 07/06/23   Janus Ozell RAMAN, MD   ALPRAZolam  (XANAX ) 0.25 mg Oral Tablet Take 1 Tablet (0.25 mg total) by mouth Once per day as needed for Anxiety 06/03/20   Provider, Historical   aspirin  81 mg Oral Tablet, Chewable Chew 1 Tablet (81 mg total) Daily   Yes Provider, Historical   atorvastatin  (LIPITOR) 40 mg Oral Tablet Take 1 Tablet (40 mg total) by mouth Every evening 05/31/23 05/30/24  Beverley Doffing, MD   carvediloL  (COREG ) 3.125 mg Oral Tablet Take 1 Tablet (3.125 mg total) by mouth Twice daily with food 05/08/23  Yes Beverley Doffing, MD   cyclobenzaprine  (FLEXERIL ) 5 mg Oral Tablet Take 1 Tablet (5 mg total) by mouth Three times a day as needed for Muscle spasms    Provider, Historical   escitalopram  oxalate (LEXAPRO ) 10 mg Oral Tablet Take 1 Tablet (10 mg total) by mouth Every night   Yes Provider, Historical   fluticasone  (FLONASE ) 50 mcg/actuation Nasal Spray, Suspension Administer  1 Spray into each nostril Once per day as needed (Allergy symptoms (nasal congestion/ runny nose))    Provider, Historical   iron sucrose (VENOFER) 50 mg iron/2.5 mL Intravenous Solution 2.5 mL (50 mg total) 08/22/21   Provider, Historical   loperamide  (IMODIUM ) 2 mg Oral Capsule Take 2 Capsules (4 mg total) by mouth Every 6 hours as needed (Diarrhea)    Provider, Historical   loratadine  (CLARITIN ) 10 mg Oral Tablet Take 1 Tablet (10 mg total) by mouth Once per day as needed (Allergies)    Provider, Historical   losartan  (COZAAR ) 25 mg Oral Tablet Take 1 Tablet (25 mg total) by  mouth Once a day for 30 days  Patient taking differently: Take 1 Tablet (25 mg total) by mouth Take in the evening on non-dialysis days (Sun/Tues/Thurs/Sat); does not take on dialysis days 03/30/20 09/24/24 Yes Marchelle Locus, MD   metoprolol  succinate (TOPROL -XL) 25 mg Oral Tablet Sustained Release 24 hr Take 1 Tablet (25 mg total) by mouth Daily   Yes Provider, Historical   MOUNJARO 7.5 mg/0.5 mL Subcutaneous Pen Injector Inject 0.5 mL (7.5 mg total) under the skin Every 7 days 01/23/23   Provider, Historical   nitroGLYCERIN  (NITROSTAT ) 0.4 mg Sublingual Tablet, Sublingual Place 1 Tablet (0.4 mg total) under the tongue Every 5 minutes as needed for Chest pain for up to 10 doses for 3 doses over 15 minutes 04/25/23  Yes Oshiyoye, [place], MD   TRIPHROCAPS 1 mg Oral Capsule Take 1 Capsule by mouth Every night   Yes Provider, Historical   vitamin E 100 unit Oral Capsule Take 1 Capsule (100 Units total) by mouth Twice daily   Yes Provider, Historical   Fish Oil -Omega-3 Fatty Acids 360-1,200 mg Oral Capsule Take 2 Capsules by mouth Twice daily  09/10/24  Provider, Historical   omega-3-DHA-EPA-fish oil  (FISH OIL ) 1,000 mg (120 mg-180 mg) Oral Capsule Take by mouth 09/30/21 09/10/24  Provider, Historical   promethazine -dextromethorphan (PHENERGAN -DM) 6.25-15 mg/5 mL Oral Syrup TAKE 5 ML BY MOUTH 4 TIMES DAILY AS NEEDED FOR COUGH  09/10/24  Provider,  Historical   sevelamer  carbonate (RENVELA ) 800 mg Oral Tablet Take 2 Tablets (1,600 mg total) by mouth 03/08/21 09/10/24  Provider, Historical   ticagrelor  (BRILINTA ) 90 mg Oral Tablet Take 1 tablet by mouth twice daily 05/13/24 09/10/24  Claudene Galley, APRN, CNP       Family History:     Family Medical History:       Problem Relation (Age of Onset)    Breast Cancer Paternal Aunt    Cancer Maternal Grandmother    Diabetes Father, Paternal Aunt    High Cholesterol Maternal Uncle, Mother    Hypertension (High Blood Pressure) Maternal Uncle, Father              Social History:     Social History     Socioeconomic History    Marital status: Married     Spouse name: Not on file    Number of children: Not on file    Years of education: Not on file    Highest education level: Not on file   Occupational History    Not on file   Tobacco Use    Smoking status: Never    Smokeless tobacco: Never   Vaping Use    Vaping status: Never Used   Substance and Sexual Activity    Alcohol use: No    Drug use: No    Sexual activity: Yes     Partners: Male     Birth control/protection: Surgical   Other Topics Concern    Ability to Walk 1 Flight of Steps without SOB/CP Not Asked    Routine Exercise Not Asked    Ability to Walk 2 Flight of Steps without SOB/CP Not Asked    Unable to Ambulate Not Asked    Total Care Not Asked    Ability To Do Own ADL's Yes    Uses Walker Not Asked    Other Activity Level Yes     Comment: SOB after climbing approx. 8 steps, sedentary since recent hosp. for fluid overload and CKD    Uses  Cane Not Asked   Social History Narrative    Not on file     Social Determinants of Health     Financial Resource Strain: Not on file   Transportation Needs: No Transportation Needs (08/24/2024)    Received from [facility] - Transportation     In the past 12 months, has lack of transportation kept you from medical appointments or from getting medications?: No     In the past 12 months, has lack of  transportation kept you from meetings, work, or from getting things needed for daily living?: No   Social Connections: Low Risk (01/28/2024)    Social Connections     SDOH Social Isolation: 5 or more times a week   Intimate Partner Violence: Not At Risk (08/24/2024)    Received from [facility]    Humiliation, Afraid, Rape, and Kick questionnaire     Within the last year, have you been afraid of your partner or ex-partner?: No     Within the last year, have you been humiliated or emotionally abused in other ways by your partner or ex-partner?: No     Within the last year, have you been kicked, hit, slapped, or otherwise physically hurt by your partner or ex-partner?: No     Within the last year, have you been raped or forced to have any kind of sexual activity by your partner or ex-partner?: No   Housing Stability: Low Risk  (08/24/2024)    Received from [facility] Stability Vital Sign     In the last 12 months, was there a time when you were not able to pay the mortgage or rent on time?: No     In the past 12 months, how many times have you moved where you were living?: 0     At any time in the past 12 months, were you homeless or living in a shelter (including now)?: No       Physical Exam:     Vitals:    09/11/24 0828 09/11/24 0913 09/11/24 1000 09/11/24 1145   BP:   (!) 166/82 (!) 159/91   Pulse:   81 76   Resp:   (!) 22 19   Temp:  37.1 C (98.7 F)  36.3 C (97.3 F)   SpO2:   98% 99%   Weight: 114 kg (251 lb 5.2 oz)      Height:       BMI:             Body mass index is 39.36 kg/m.    Intake and Output Summary (Last 24 hours) at Date Time    Intake/Output Summary (Last 24 hours) at 09/11/2024 1411  Last data filed at 09/11/2024 0828  Gross per 24 hour   Intake 24 ml   Output -2500 ml   Net 2524 ml       Physical Exam  Vitals and nursing note reviewed.   Constitutional:       Appearance: Normal appearance. She is obese.   Cardiovascular:      Rate and Rhythm: Normal rate and  regular rhythm.      Pulses: Normal pulses.           Radial pulses are 2+ on the right side and 2+ on the left side.      Heart sounds: Murmur heard.   Pulmonary:      Effort: Pulmonary effort is  normal.      Breath sounds: Normal breath sounds. No rales.   Abdominal:      Palpations: Abdomen is soft.   Skin:     General: Skin is warm and dry.      Capillary Refill: Capillary refill takes less than 2 seconds.   Neurological:      Mental Status: She is alert and oriented to person, place, and time. Mental status is at baseline.   Psychiatric:         Mood and Affect: Mood normal.         Behavior: Behavior normal.         Thought Content: Thought content normal.         Judgment: Judgment normal.             Labs Reviewed:     Recent Labs     09/10/24  1551   SODIUM 143   POTASSIUM 4.6   CO2 24   BUN 40*   CREATININE 6.71*   GLUCOSE 125     No results found for this encounter  Recent Labs     09/10/24  1551 09/11/24  0305   WBC 4.3 5.5   HGB 11.6 10.7*   HCT 35.7 32.7*   PLTCNT 213 237     Recent Labs     09/10/24  1551   AST 28   ALT 27     Recent Labs     09/11/24  0114 09/11/24  0751   APTT 31.7 42.4*     No results found for this encounter  No results found for this encounter  Recent Results (from the past 85999 hours)   LIPID PANEL    Collection Time: 09/10/24  3:51 PM   Result Value    CHOLESTEROL 134    HDL CHOL 38 (L)    TRIGLYCERIDES 187 (H)    NON-HDL 96    LDL CALC 65    VLDL CALC 28    CHOL/HDL RATIO 3.5   LIPID PANEL    Collection Time: 04/22/23  3:35 AM   Result Value    CHOLESTEROL  107    HDL CHOL 37 (L)    TRIGLYCERIDES 106    LDL CALC 50    VLDL CALC 15    NON-HDL 70    CHOL/HDL RATIO 2.9            Personally reviewed by Wilda Sharps, APRN, CNP    Rads:     Results for orders placed or performed during the hospital encounter of 09/10/24 (from the past 72 hours)   XR AP MOBILE CHEST (If patient condition warrants)     Status: None    Narrative    RADIOLOGIST: Garnette CHRISTELLA Dinsmore    EXAMINATION: XR  AP MOBILE CHEST     EXAM DATE/TIME: 09/10/2024 3:58 PM    CLINICAL INDICATION: Chest pain  Additional History: Chest pain     COMPARISON: Jan 28, 2024    FINDINGS: Spondylosis thoracic spine.    Heart size normal. Pulmonary vascularity normal.    No infiltrate, pleural effusion or pneumothorax.       Impression    No acute cardiopulmonary disease.    .          Radiologist location ID: [id number]         Personally reviewed by Wilda Sharps, APRN, CNP    Cardiovascular Workup:     Echo 10/18/20  Technically difficult due to  patient body habitus.  The left ventricular ejection fraction by visual assessment is estimated to be 55-60%.  Mildly calcified aortic valve leaflets --especially the right coronary cusp.  There is a small mobile density on the aortic valve seen mainly in parasternal long axis views. No aortic valve stenosis or regurgitation.  Subsequent echo w AS  The study images were of technically adequate quality.  Transthoracic complete echo with contrast, 2D, spectral and tissue Doppler, color flow Doppler, M-mode.  Concentric remodeling.  Left ventricular diastolic parameters are normal.  The mitral valve is not well visualized.  No evidence of mitral stenosis.  There is mild mitral regurgitation.  Trace aortic regurgitation present.  The pulmonic valve is normal.  No significant pulmonic valve regurgitation present.  Trileaflet aortic valve.  There is a small mobile density on the aortic valve.  The interatrial septum is normal in appearance.  Normal IVC size with <50% inspiratory collapse (estimated RA pressure: 8 mmHg).  The aortic root is of normal size.  Normal pericardium with no pericardial effusion.     Echo 09/14/21  Normal left ventricular size.The left ventricular ejection fraction by visual assessment is estimated to be 60-65%.  Trace mitral regurgitation present.  Trace tricuspid regurgitation present.  There is mild aortic stenosis, mean PG 11 mmHg.     Stress Echo 09/15/22  This stress test  was normal.      EKG 04/21/23  Sinus tachycardia  Anterior infarct  Marked ST abnormality, possible lateral subendocardial injury     LEFT HEART CATHETERIZATION/PCI OF THE PROXIMAL LAD April 23, 2023:  Successful angioplasty and stenting of the ostial to mid LAD.    Moderate to severe aortic valvular stenosis suggested with a peak to peak aortic valve gradient of 38 mmHg.  Severely reduced LV global systolic function with an EF of 30% and severely elevated LVEDP of 30.       Echo 04/24/23  Normal left ventricular size.   Left ventricular systolic function is normal.   The left ventricular ejection fraction by visual assessment is estimated to be 65-70%.   Left ventricular diastolic parameters are normal.   Normal right ventricular systolic function.   There is moderate aortic stenosis.     Echo 09/11/23   Optison  was used for endocardial border enhancement. Small   left ventricular cavity size.  Mild to moderate concentric   hypertrophy of the left ventricle. Hyperdynamic LV systolic   function.  Estimated EF of 70-75%.  Normal regional wall motion.     Abnormal diastolic filling pattern for age, Grade I (Impaired     relaxation).     2. Normal right ventricular size. Normal right ventricular global  systolic function.       3. The aortic valve is calcified with restricted leaflet opening.   Trileaflet aortic valve. Moderate aortic stenosis. Peak velocity of  3.2 m/s.  Peak aortic gradient is _42__ mmHg, mean aortic gradient   is __22_ mmHg, aortic valve area by continuity equation is _1.6__   cm2. Dimensionless index of 0.40. No aortic regurgitation noted.        Echo 11/20/23  Normal left ventricular cavity size. Moderate septal hypertrophy,   mild inferolateral wall hypertrophy. Normal global left ventricular   systolic function. EF estimated at 60-65%. Normal regional wall  motion. Grade II diastolic dysfunction, elevated LA filling  pressures.     Normal right ventricular size. Normal right ventricular global      systolic function.  Trace tricuspid regurgitation noted. Unable to estimate RVSP due   to inadequate Doppler signal. IVC diameter is normal with normal   respirophasic motion >50 percent compression with sniff test.      Moderate aortic stenosis - 1.2 cm2, dimensionless index 0.35   (severe<0.25), mean gradient 29 mmHg, peak velocity 3.8 m/sec. No   aortic regurgitation noted.     Dobutamine stress echo 11/20/2023  Grossly negative dobutamine stress echo for ischemia at adequate heart  rate response (reached 91% max predicted heart rate, target is 85%) by  echo and EKG criteria.  Test limited by suboptimal image quality.      The test was terminated due to target heart rate achieved; no chest pain  reported. Normal blood pressure response to dobutamine. Normal LVEF  and wall motion at baseline.      Echo 01/29/2024  Limited for LVEF  Left ventricular systolic function is normal.  The left ventricular ejection fraction by visual assessment is estimated to be 60-65%.  Normal right ventricular systolic function.    EKG 09/11/2024   NSR      Echo 09/11/2024  Normal left ventricular size.  The left ventricular ejection fraction by visual assessment is estimated to be 60-65%.  There is severe aortic stenosis.The mean gradient across the aortic valve is 44.48 mmHg     Telemetry strips: Sinus    Personally reviewed by Wilda Sharps, APRN, CNP    Assessment:     [name] is a 58 y.o. White female with:    NSTEMI, Type I  vs Type II  -Troponin peaking at 2349  - left heart catheterization on April 23, 2023 Proximal LAD CAD, s/p DES   -Complaints not consistent with cardiac origin       HFpEF:  - 04/23/23 Cath:    LVEDP of 30 and left ventricular ejection fraction of 30% on LV angiography during catheterization April 23, 2023.  See echo  stunning recovered  - 04/24/23 echo w remarkably recovered LV EF - thus no lifevest at this time  - Echo 09/11/2024 EF 60-65%     Aortic valvular stenosis:   - mild aortic stenosis  reported on echo in 04/24/23 and 08/2021 echo  - moderate to severe aortic valve stenosis suggested with a peak to peak aortic valve gradient of 38 mmhg by catheterization April 23, 2023  -  Echo 09/11/2024 showing severe AS with gradient 44  - Followed by [facility]     HTN- elevated  HLD: FLP 04/22/23 Total 107 Tri 106 HDL 37 LDL 50  DM- A1C 6.4 on 02/16/22   Renal CA  - s/p left radical nephrectomy 2021   ESRD  -on HD-currently learning to do PD at home  - renal function panel 04/25/23 Cr 4.36  - Working with [facility] transplant     Hyperthyroid   Status post parathyroidectomy, July of 2024   Obesity- BMI 39   Nonsmoker            Plan:     - Upon review of patient stated symptoms, past cardiac cath, and comorbidities, the likelihood of this being cardiac in nature is low.  - She has a pending appointment with [facility] in regards to her AS.  Upon discussion with patient, we will elect to hold off on any further diagnostic test such as TEE to  assess AS given it will also be performed at [facility] with valve replacement workup  - Patient currently on Toprol  25,  losartan  25, and hydralazine  10 mg q.6 p.r.n. for blood pressure control.  We will leave BP management upon the recommendations of Nephrology given end-stage renal disease.    Thank you for allowing us  to participate in the care of your patient. Please feel free to contact us  if further questions arise.    Wilda Sharps, APRN, CNP  [facility]      I agree with the assessment and plan by Wilda Sharps C-NP.  I was an integral part of the development of the assessment and plan  Leita Chancy, MD [facility]  [facility]          I independently of the faculty provider spent a total of (30) minutes in direct care of this patient including initial evaluation, review of laboratory, radiology, diagnostic studies, review of medical record, order entry and coordination of care.    Portions of this note may be dictated using  voice recognition software or a dictation service. Variances in spelling and vocabulary are possible and unintentional. Not all errors are caught/corrected. Please notify the dino if any discrepancies are noted, or if the meaning of any statement is not clear.           [1]   Allergies  Allergen Reactions    Gabapentin Itching    Hydrocodone      Ibuprofen      Vicodin [Hydrocodone -Acetaminophen ] Itching

## 2024-09-11 NOTE — Care Management Notes (Signed)
 Deward, RN/UR sent me a secure chat message letting me know that patient's admission status changed from Observation to Inpatient. This happened prior to [facility] form due time.

## 2024-09-11 NOTE — Nurses Notes (Signed)
 Trop: 2042.6. MD aware. No new orders

## 2024-09-11 NOTE — Care Management Notes (Signed)
 09/11/24 1310   Assessment Detail   Assessment Type Admission   Date of Care Management Update 09/11/24   Date of Next DCP Update 09/14/24   Readmission   Is this a readmission? No   Social Work Equities Trader Status initial meeting   Anticipated Discharge Disposition Home   Discharge Needs Assessment   Equipment Currently Used at Home   (supplies for home PD)   Equipment Needed After Discharge   (unknown at this time)   Transportation Available car;family or friend will provide  (likely her daughter can transport her home at [place])   Referral Information   Admission Type inpatient   Arrived From home or self-care   Address Verified verified-no changes   Employment/Military        Pharmacy Name [facility]        Pharmacy Location [place]   Admission Notifications   PCP Name Erminio Cobble, NP  (last saw 09/04/24)   Living Environment   Lives With child(ren), adult  (daughter)   Able to Return to Prior Arrangements yes   Living Arrangement Comments denies issues with mobility     Met with patient to start DC planning. Demographics & insurance verified. DME listed above. Please consult CM for any known or anticipated DC needs. CM will continue to follow for inpatient DC planning.

## 2024-09-11 NOTE — Progress Notes (Addendum)
 IP PROGRESS NOTE      [name], [name]  Date of Admission:  09/10/2024  Date of Birth:  [birth year]  Date of Service:  09/11/2024    Chief Complaint:  Patient is chest pain and chest tightness free this morning.  Blood pressure well controlled.    Problem List:  Active Hospital Problems   (*Primary Problem)    Diagnosis    *Chest tightness       Assessment/ Plan:     NSTEMI type I  Troponin trended up to 2400 overnight.  Trending down 2200 now.  Patient started on heparin  drip overnight.  I discussed with cardiology team.  We will continue heparin  drip.  Patient can eat today.  We will make a decision on intervention tomorrow.  Reviewed EKG.  Negative for acute ST-T changes.    Will continue home metoprolol  aspirin  Lipitor   P.r.n. nitro  Echocardiogram pending   Labs reviewed hemoglobin 10.7, PTT 42  Continue tele monitoring     ESRD on peritoneal dialysis   Consulted nephrologist   PT ordered     History of diabetes   Ordered hemoglobin AIC.  Ordered sliding scale.  Fingersticks controlled 90-100.      Anxiety.  Ordered him Lexapro  and Xanax .  Recently started on BuSpar .     Full code        Danford Sinks, MD        Vital Signs:  Temp (24hrs) Max:37.1 C (98.7 F)      Temperature: 36.3 C (97.3 F)  BP (Non-Invasive): (!) 159/91  MAP (Non-Invasive): 111 mmHG  Heart Rate: 76  Respiratory Rate: 19  SpO2: 99 %      Objective     Current Medications:  2.5% dextrose  (low calcium ) peritoneal dialysis PREMIX solution - NO additives, 2 L, Peritoneal catheter, Q6HRS  acetaminophen  (TYLENOL ) tablet, 650 mg, Oral, Q6H PRN  albuterol  (PROVENTIL ) 2.5 mg / 3 mL (0.083%) neb solution, 2.5 mg, Nebulization, Q4H PRN  ALPRAZolam  (XANAX ) tablet, 0.25 mg, Oral, Daily PRN  aspirin  chewable tablet 81 mg, 81 mg, Oral, Daily  atorvastatin  (LIPITOR) tablet, 40 mg, Oral, QPM  busPIRone  (BUSPAR ) tablet, 5 mg, Oral, 2x/day  Correction/SSIP insulin  lispro 100 units/mL injection pen, 1-5 Units, Subcutaneous, 4x/day AC  D5W 250 mL flush  bag, , Intravenous, Q15 Min PRN  dextrose  (GLUTOSE) 40% oral gel, 15 g, Oral, Q15 Min PRN  dextrose  50% (0.5 g/mL) injection - syringe, 12.5 g, Intravenous, Q15 Min PRN  escitalopram  (LEXAPRO ) tablet, 10 mg, Oral, NIGHTLY  glucagon  injection 1 mg, 1 mg, IntraMUSCULAR, Once PRN  heparin  25,000 units in 1/2 NS 250 mL infusion, 11.8 Units/kg/hr (Adjusted), Intravenous, Continuous  hydrALAZINE  (APRESOLINE ) injection 10 mg, 10 mg, Intravenous, Q6H PRN  losartan  (COZAAR ) tablet, 25 mg, Oral, Daily  metoprolol  succinate (TOPROL -XL) 24 hr extended release tablet, 25 mg, Oral, Daily  nitroGLYCERIN  (NITROSTAT ) sublingual tablet, 0.4 mg, Sublingual, Q5 Min PRN  NS 250 mL flush bag, , Intravenous, Q15 Min PRN  NS flush syringe, 10 mL, Intravenous, Q8H  NS flush syringe, 10 mL, Intravenous, Q8HRS  NS flush syringe, 10 mL, Intravenous, Q1H PRN  ondansetron  (ZOFRAN ) 2 mg/mL injection, 4 mg, Intravenous, Q8H PRN  sevelamer  carbonate (RENVELA ) tablet, 1,600 mg, Oral, 3x/day-Meals          General: AAO x3 ; NAD   Eyes: Conjunctiva and sclera clear. Bilateral EOM intact.   HENT: Normocephalic, atraumatic.   Cardiovascular: Regular rate and rhythm. Crescendo-decrescendo systolic ejection murmur- right upper sternal  border  Respiratory: Clear to auscultation bilaterally. No rales, rhonchi, or wheezes. No use of accessory muscles. Normal work of breathing.   Gastrointestinal:  Soft,  nondistended, nontender  Musculoskeletal: No obvious deformity  Neurological: AAO x 3. No focal deficits. Moving all extremities and following commands.    Integumentary/Skin: No erythema or rash.  Psychiatric: Cooperative with exam. Not agitated. Normal affect.           I/O:  I/O last 24 hours:    Intake/Output Summary (Last 24 hours) at 09/11/2024 1646  Last data filed at 09/11/2024 1508  Gross per 24 hour   Intake 384 ml   Output -3750 ml   Net 4134 ml     I/O current shift:  12/18 0700 - 12/18 1859  In: 360 [P.O.:360]  Out: -3750       Labs  Please  indicate ordered or reviewed)  Reviewed: I have reviewed all lab results.    CBC (Last 48 Hours):    Recent Results in last 48 hours     09/10/24  1551 09/11/24  0305   WBC 4.3 5.5   HGB 11.6 10.7*   HCT 35.7 32.7*   MCV 104.1* 102.8*   PLTCNT 213 237        BMP (Last 24 Hours):  No results for input(s): SODIUM, POTASSIUM, CHLORIDE, CO2, BUN, CREATININE, CALCIUM , GLUCOSENF in the last 24 hours.        Radiology Tests (Please indicate ordered or reviewed)  Reviewed: personally reviewed all available images    Medical decision making:  All pertinent labs were personally reviewed with additional follow-up lab work ordered as deemed clinically appropriate.  All available imaging results and EKGs were reviewed and independently interpreted.  Pertinent previous medical record results were reviewed.  Further workup and treatment depending on clinical cours      Portions of this note may be dictated using voice recognition software or a dictation service. Variances in spelling and vocabulary are possible and unintentional. Not all errors are caught/corrected. Please notify the dino if any discrepancies are noted or if the meaning of any statement is not clear.

## 2024-09-12 LAB — CBC WITH DIFF
BASOPHIL #: <0.1 x10ˆ3/uL (ref <=0.20)
BASOPHIL %: 0.2 %
EOSINOPHIL #: 0.13 x10ˆ3/uL (ref <=0.50)
EOSINOPHIL %: 2.5 %
HCT: 34.9 % (ref 34.8–46.0)
HGB: 11.1 g/dL — ABNORMAL LOW (ref 11.5–16.0)
IMMATURE GRANULOCYTE #: <0.1 x10ˆ3/uL (ref <0.10)
IMMATURE GRANULOCYTE %: 0.2 % (ref 0.0–1.0)
LYMPHOCYTE #: 1.2 x10ˆ3/uL (ref 1.00–4.80)
LYMPHOCYTE %: 23.1 %
MCH: 33.1 pg — ABNORMAL HIGH (ref 26.0–32.0)
MCHC: 31.8 g/dL (ref 31.0–35.5)
MCV: 104.2 fL — ABNORMAL HIGH (ref 78.0–100.0)
MONOCYTE #: 0.44 x10ˆ3/uL (ref 0.20–1.10)
MONOCYTE %: 8.5 %
MPV: 10.7 fL (ref 8.7–12.5)
NEUTROPHIL #: 3.41 x10ˆ3/uL (ref 1.50–7.70)
NEUTROPHIL %: 65.5 %
PLATELETS: 217 x10ˆ3/uL (ref 150–400)
RBC: 3.35 x10ˆ6/uL — ABNORMAL LOW (ref 3.85–5.22)
RDW-CV: 17.7 % — ABNORMAL HIGH (ref 11.5–15.5)
WBC: 5.2 x10ˆ3/uL (ref 3.7–11.0)

## 2024-09-12 LAB — PHOSPHORUS: PHOSPHORUS: 4.5 mg/dL (ref 2.4–4.7)

## 2024-09-12 LAB — BASIC METABOLIC PANEL
ANION GAP: 14 mmol/L — ABNORMAL HIGH (ref 4–13)
BUN/CREA RATIO: 5 — ABNORMAL LOW (ref 6–22)
BUN: 38 mg/dL — ABNORMAL HIGH (ref 8–25)
CALCIUM: 9.1 mg/dL (ref 8.6–10.2)
CHLORIDE: 107 mmol/L (ref 96–111)
CO2 TOTAL: 20 mmol/L — ABNORMAL LOW (ref 22–30)
CREATININE: 6.96 mg/dL — ABNORMAL HIGH (ref 0.60–1.05)
GLUCOSE: 144 mg/dL — ABNORMAL HIGH (ref 65–125)
POTASSIUM: 3.7 mmol/L (ref 3.5–5.1)
SODIUM: 141 mmol/L (ref 136–145)
eGFRcr - FEMALE: 6 mL/min/1.73mˆ2 — ABNORMAL LOW (ref >=60)

## 2024-09-12 LAB — PTT (PARTIAL THROMBOPLASTIN TIME): APTT: 127 s (ref 24.0–36.5)

## 2024-09-12 LAB — MAGNESIUM: MAGNESIUM: 2.2 mg/dL (ref 1.8–2.6)

## 2024-09-12 MED ORDER — ATORVASTATIN 40 MG TABLET: 80.0000 mg | ORAL_TABLET | Freq: Every evening | ORAL | Status: DC

## 2024-09-12 MED ORDER — HEPARIN (PORCINE) 5,000 UNITS/ML BOLUS FOR DOSE ADJUSTMENT
50.0000 [IU]/kg | Freq: Once | INTRAMUSCULAR | Status: AC
  Administered 2024-09-12: 4000 [IU] via INTRAVENOUS
  Filled 2024-09-12: qty 1

## 2024-09-12 MED ORDER — METOPROLOL SUCCINATE ER 25 MG TABLET,EXTENDED RELEASE 24 HR: 50.0000 mg | ORAL_TABLET | Freq: Every day | ORAL | 0 refills | Status: AC

## 2024-09-12 MED ORDER — ATORVASTATIN 80 MG TABLET: 80.0000 mg | ORAL_TABLET | Freq: Every evening | ORAL | 0 refills | Status: AC

## 2024-09-12 MED ORDER — METOPROLOL SUCCINATE ER 50 MG TABLET,EXTENDED RELEASE 24 HR
50.0000 mg | ORAL_TABLET | Freq: Every day | ORAL | Status: DC
  Administered 2024-09-12: 50 mg via ORAL
  Filled 2024-09-12: qty 1

## 2024-09-12 MED ORDER — SEVELAMER CARBONATE 800 MG TABLET: 1600.0000 mg | ORAL_TABLET | Freq: Three times a day (TID) | ORAL | Status: AC

## 2024-09-12 NOTE — Care Plan (Signed)
 Goal Outcome Evaluation:     Anxieties, Fears or Concerns: wondering if having heart cath (09/12/24 0844)  Individualized Care Needs: heparin  gtt and PD (09/12/24 0844)  Patient-Specific Goals (Include Timeframe): have hospitalizations stop (09/12/24 0844)  Plan of Care Reviewed With: patient (09/12/24 0844)     Patient Progress: improving    Adequeate for discharge         Problem: Adult Inpatient Plan of Care  Goal: Absence of Hospital-Acquired Illness or Injury  Outcome: Adequate for Discharge  Intervention: Identify and Manage Fall Risk  Recent Flowsheet Documentation  Taken 09/12/2024 0844 by Rosina FALCON, RN  Safety Promotion/Fall Prevention:   fall prevention program maintained   nonskid shoes/slippers when out of bed   safety round/check completed  Intervention: Prevent Skin Injury  Recent Flowsheet Documentation  Taken 09/12/2024 0844 by Rosina FALCON, RN  Body Position: supine, head elevated  Skin Protection: adhesive use limited  Intervention: Prevent and Manage VTE (Venous Thromboembolism) Risk  Recent Flowsheet Documentation  Taken 09/12/2024 0844 by Rosina FALCON, RN  VTE Prevention/Management: dorsiflexion/plantar flexion performed  Intervention: Prevent Infection  Recent Flowsheet Documentation  Taken 09/12/2024 0844 by Rosina FALCON, RN  Infection Prevention:   rest/sleep promoted   promote handwashing  Goal: Optimal Comfort and Wellbeing  Outcome: Adequate for Discharge  Intervention: Provide Person-Centered Care  Recent Flowsheet Documentation  Taken 09/12/2024 0844 by Rosina FALCON, RN  Trust Relationship/Rapport:   care explained   empathic listening provided   questions answered   reassurance provided   thoughts/feelings acknowledged  Goal: Rounds/Family Conference  Outcome: Adequate for Discharge     Problem: Gas Exchange Impaired  Goal: Optimal Gas Exchange  Outcome: Adequate for Discharge  Intervention: Optimize Oxygenation and Ventilation  Recent Flowsheet Documentation  Taken 09/12/2024 0844 by Rosina FALCON, RN  Head  of Bed [facility]) Positioning: HOB elevated     Problem: Fall Injury Risk  Goal: Absence of Fall and Fall-Related Injury  Outcome: Adequate for Discharge  Intervention: Identify and Manage Contributors  Recent Flowsheet Documentation  Taken 09/12/2024 0844 by Rosina FALCON, RN  Self-Care Promotion: independence encouraged  Medication Review/Management: medications reviewed  Intervention: Promote Injury-Free Environment  Recent Flowsheet Documentation  Taken 09/12/2024 0844 by Rosina FALCON, RN  Safety Promotion/Fall Prevention:   fall prevention program maintained   nonskid shoes/slippers when out of bed   safety round/check completed     Problem: Pain Acute  Goal: Optimal Pain Control and Function  Outcome: Adequate for Discharge  Intervention: Optimize Psychosocial Wellbeing  Recent Flowsheet Documentation  Taken 09/12/2024 0844 by Rosina FALCON, RN  Spiritual Activities Assistance: (pastor visited) other (see comments)  Diversional Activities:   smartphone   television  Supportive Measures: active listening utilized  Intervention: Prevent or Manage Pain  Recent Flowsheet Documentation  Taken 09/12/2024 0844 by Rosina FALCON, RN  Medication Review/Management: medications reviewed     Problem: Skin Injury Risk Increased  Goal: Skin Health and Integrity  Outcome: Adequate for Discharge  Intervention: Optimize Skin Protection  Recent Flowsheet Documentation  Taken 09/12/2024 0844 by Rosina FALCON, RN  Skin Protection: adhesive use limited  Activity Management:   ambulated in room   ambulated to bathroom   up ad lib  Head of Bed (HOB) Positioning: HOB elevated

## 2024-09-12 NOTE — Progress Notes (Signed)
 [facility]   [facility]  Progress Note  Date/Time: 09/12/2024 10:19  Patient Name: [name]  MRN#: [id number]  DOB: 1965-11-04  Admission Date: 09/10/2024   Consulting Physician: Brendan Scrape, MD  Consulting Cardiologist:  Dr. Leita Chancy    Subjective:   Denies chest pain, SOB, palpitations. Plans for DC today.          BP 120s-140s/50s-80s heart rate 80's    K3.7 CR 6.96 Mag 2.2       Physical Exam:     Vitals:    09/12/24 0309 09/12/24 0412 09/12/24 0744 09/12/24 0926   BP:  (!) 152/94 123/81    Pulse:  87     Resp:  19     Temp:  36.6 C (97.9 F) 36.9 C (98.4 F)    SpO2:  100% 100%    Weight: 121 kg (266 lb 12.1 oz)   121 kg (267 lb 10.2 oz)   Height:       BMI:             Body mass index is 41.92 kg/m.    Intake and Output Summary (Last 24 hours) at Date Time    Intake/Output Summary (Last 24 hours) at 09/12/2024 1019  Last data filed at 09/12/2024 0926  Gross per 24 hour   Intake 600 ml   Output -250 ml   Net 850 ml       Physical Exam  Vitals and nursing note reviewed.   Constitutional:       Appearance: Normal appearance.   Cardiovascular:      Rate and Rhythm: Normal rate and regular rhythm.      Pulses: Normal pulses.           Radial pulses are 2+ on the right side and 2+ on the left side.      Heart sounds: S1 normal and S2 normal. Murmur heard.   Pulmonary:      Effort: Pulmonary effort is normal.      Breath sounds: Normal breath sounds. No rales.   Abdominal:      Palpations: Abdomen is soft.   Musculoskeletal:      Right lower leg: No edema.      Left lower leg: No edema.   Skin:     General: Skin is warm and dry.      Capillary Refill: Capillary refill takes less than 2 seconds.   Neurological:      Mental Status: She is alert and oriented to person, place, and time. Mental status is at baseline.   Psychiatric:         Mood and Affect: Mood normal.         Behavior: Behavior normal.         Thought Content: Thought content normal.         Judgment: Judgment  normal.                 Medications:     Current Facility-Administered Medications   Medication Dose Route Frequency    2.5% dextrose  (low calcium ) peritoneal dialysis PREMIX solution - NO additives  2 L Peritoneal catheter Q6HRS    acetaminophen  (TYLENOL ) tablet  650 mg Oral Q6H PRN    albuterol  (PROVENTIL ) 2.5 mg / 3 mL (0.083%) neb solution  2.5 mg Nebulization Q4H PRN    ALPRAZolam  (XANAX ) tablet  0.25 mg Oral Daily PRN    aspirin  chewable tablet 81 mg  81 mg  Oral Daily    atorvastatin  (LIPITOR) tablet  80 mg Oral QPM    busPIRone  (BUSPAR ) tablet  5 mg Oral 2x/day    Correction/SSIP insulin  lispro 100 units/mL injection pen  1-5 Units Subcutaneous 4x/day AC    D5W 250 mL flush bag   Intravenous Q15 Min PRN    dextrose  (GLUTOSE) 40% oral gel  15 g Oral Q15 Min PRN    dextrose  50% (0.5 g/mL) injection - syringe  12.5 g Intravenous Q15 Min PRN    escitalopram  (LEXAPRO ) tablet  10 mg Oral NIGHTLY    glucagon  injection 1 mg  1 mg IntraMUSCULAR Once PRN    heparin  25,000 units in 1/2 NS 250 mL infusion  11.8 Units/kg/hr (Adjusted) Intravenous Continuous    hydrALAZINE  (APRESOLINE ) injection 10 mg  10 mg Intravenous Q6H PRN    losartan  (COZAAR ) tablet  25 mg Oral Daily    metoprolol  succinate (TOPROL -XL) 24 hr extended release tablet  50 mg Oral Daily    nitroGLYCERIN  (NITROSTAT ) sublingual tablet  0.4 mg Sublingual Q5 Min PRN    NS 250 mL flush bag   Intravenous Q15 Min PRN    NS flush syringe  10 mL Intravenous Q8H    NS flush syringe  10 mL Intravenous Q8HRS    NS flush syringe  10 mL Intravenous Q1H PRN    nystatin  (NYSTOP ) 100,000 units/g topical powder   Apply Topically 2x/day    ondansetron  (ZOFRAN ) 2 mg/mL injection  4 mg Intravenous Q8H PRN    sevelamer  carbonate (RENVELA ) tablet  1,600 mg Oral 3x/day-Meals     Prior to Admission medications   Medication Sig Start Date End Date Taking? Authorizing Provider   albuterol  sulfate (PROVENTIL  OR VENTOLIN  OR PROAIR ) 90 mcg/actuation Inhalation oral inhaler Take 1-2  Puffs by inhalation Every 6 hours as needed 07/06/23   Janus Ozell RAMAN, MD   ALPRAZolam  (XANAX ) 0.25 mg Oral Tablet Take 1 Tablet (0.25 mg total) by mouth Once per day as needed for Anxiety 06/03/20   Provider, Historical   aspirin  81 mg Oral Tablet, Chewable Chew 1 Tablet (81 mg total) Daily   Yes Provider, Historical   atorvastatin  (LIPITOR) 40 mg Oral Tablet Take 1 Tablet (40 mg total) by mouth Every evening 05/31/23 05/30/24  Beverley Doffing, MD   carvediloL  (COREG ) 3.125 mg Oral Tablet Take 1 Tablet (3.125 mg total) by mouth Twice daily with food 05/08/23  Yes Beverley Doffing, MD   cyclobenzaprine  (FLEXERIL ) 5 mg Oral Tablet Take 1 Tablet (5 mg total) by mouth Three times a day as needed for Muscle spasms    Provider, Historical   escitalopram  oxalate (LEXAPRO ) 10 mg Oral Tablet Take 1 Tablet (10 mg total) by mouth Every night   Yes Provider, Historical   fluticasone  (FLONASE ) 50 mcg/actuation Nasal Spray, Suspension Administer 1 Spray into each nostril Once per day as needed (Allergy symptoms (nasal congestion/ runny nose))    Provider, Historical   iron sucrose (VENOFER) 50 mg iron/2.5 mL Intravenous Solution 2.5 mL (50 mg total) 08/22/21   Provider, Historical   loperamide  (IMODIUM ) 2 mg Oral Capsule Take 2 Capsules (4 mg total) by mouth Every 6 hours as needed (Diarrhea)    Provider, Historical   loratadine  (CLARITIN ) 10 mg Oral Tablet Take 1 Tablet (10 mg total) by mouth Once per day as needed (Allergies)    Provider, Historical   losartan  (COZAAR ) 25 mg Oral Tablet Take 1 Tablet (25 mg total) by mouth Once a day for  30 days  Patient taking differently: Take 1 Tablet (25 mg total) by mouth Take in the evening on non-dialysis days (Sun/Tues/Thurs/Sat); does not take on dialysis days 03/30/20 09/24/24 Yes Marchelle Locus, MD   metoprolol  succinate (TOPROL -XL) 25 mg Oral Tablet Sustained Release 24 hr Take 1 Tablet (25 mg total) by mouth Daily   Yes Provider, Historical   MOUNJARO 7.5 mg/0.5 mL Subcutaneous Pen Injector  Inject 0.5 mL (7.5 mg total) under the skin Every 7 days 01/23/23   Provider, Historical   nitroGLYCERIN  (NITROSTAT ) 0.4 mg Sublingual Tablet, Sublingual Place 1 Tablet (0.4 mg total) under the tongue Every 5 minutes as needed for Chest pain for up to 10 doses for 3 doses over 15 minutes 04/25/23  Yes Oshiyoye, [place], MD   TRIPHROCAPS 1 mg Oral Capsule Take 1 Capsule by mouth Every night   Yes Provider, Historical   vitamin E 100 unit Oral Capsule Take 1 Capsule (100 Units total) by mouth Twice daily   Yes Provider, Historical   Fish Oil -Omega-3 Fatty Acids 360-1,200 mg Oral Capsule Take 2 Capsules by mouth Twice daily  09/10/24  Provider, Historical   omega-3-DHA-EPA-fish oil  (FISH OIL ) 1,000 mg (120 mg-180 mg) Oral Capsule Take by mouth 09/30/21 09/10/24  Provider, Historical   promethazine -dextromethorphan (PHENERGAN -DM) 6.25-15 mg/5 mL Oral Syrup TAKE 5 ML BY MOUTH 4 TIMES DAILY AS NEEDED FOR COUGH  09/10/24  Provider, Historical   sevelamer  carbonate (RENVELA ) 800 mg Oral Tablet Take 2 Tablets (1,600 mg total) by mouth 03/08/21 09/10/24  Provider, Historical   ticagrelor  (BRILINTA ) 90 mg Oral Tablet Take 1 tablet by mouth twice daily 05/13/24 09/10/24  Claudene Galley, APRN, CNP       Labs Reviewed:     Recent Labs     09/10/24  1551 09/12/24  0542   SODIUM 143 141   POTASSIUM 4.6 3.7   CO2 24 20*   BUN 40* 38*   CREATININE 6.71* 6.96*   GLUCOSE 125 144*     Recent Labs     09/12/24  0542   MAGNESIUM  2.2     Recent Labs     09/10/24  1551 09/11/24  0305 09/12/24  0542   WBC 4.3 5.5 5.2   HGB 11.6 10.7* 11.1*   HCT 35.7 32.7* 34.9   PLTCNT 213 237 217     Recent Labs     09/10/24  1551   AST 28   ALT 27     Recent Labs     09/11/24  0114 09/11/24  0751 09/11/24  1502 09/11/24  2228 09/12/24  0818   APTT 31.7 42.4* 50.6* 35.5 127.0*     No results found for this encounter  No results found for this encounter  Recent Results (from the past 85999 hours)   LIPID PANEL    Collection Time: 09/10/24  3:51 PM   Result Value     CHOLESTEROL 134    HDL CHOL 38 (L)    TRIGLYCERIDES 187 (H)    NON-HDL 96    LDL CALC 65    VLDL CALC 28    CHOL/HDL RATIO 3.5   LIPID PANEL    Collection Time: 04/22/23  3:35 AM   Result Value    CHOLESTEROL  107    HDL CHOL 37 (L)    TRIGLYCERIDES 106    LDL CALC 50    VLDL CALC 15    NON-HDL 70    CHOL/HDL RATIO 2.9  Personally reviewed by Wilda Sharps, APRN, CNP    Rads:     Results for orders placed or performed during the hospital encounter of 09/10/24 (from the past 72 hours)   XR AP MOBILE CHEST (If patient condition warrants)     Status: None    Narrative    RADIOLOGIST: Garnette CHRISTELLA Dinsmore    EXAMINATION: XR AP MOBILE CHEST     EXAM DATE/TIME: 09/10/2024 3:58 PM    CLINICAL INDICATION: Chest pain  Additional History: Chest pain     COMPARISON: Jan 28, 2024    FINDINGS: Spondylosis thoracic spine.    Heart size normal. Pulmonary vascularity normal.    No infiltrate, pleural effusion or pneumothorax.       Impression    No acute cardiopulmonary disease.    .          Radiologist location ID: [id number]         Personally reviewed by Wilda Sharps, APRN, CNP    Cardiovascular Workup:     Echo 10/18/20  Technically difficult due to patient body habitus.  The left ventricular ejection fraction by visual assessment is estimated to be 55-60%.  Mildly calcified aortic valve leaflets --especially the right coronary cusp.  There is a small mobile density on the aortic valve seen mainly in parasternal long axis views. No aortic valve stenosis or regurgitation.  Subsequent echo w AS  The study images were of technically adequate quality.  Transthoracic complete echo with contrast, 2D, spectral and tissue Doppler, color flow Doppler, M-mode.  Concentric remodeling.  Left ventricular diastolic parameters are normal.  The mitral valve is not well visualized.  No evidence of mitral stenosis.  There is mild mitral regurgitation.  Trace aortic regurgitation present.  The pulmonic valve is normal.  No significant  pulmonic valve regurgitation present.  Trileaflet aortic valve.  There is a small mobile density on the aortic valve.  The interatrial septum is normal in appearance.  Normal IVC size with <50% inspiratory collapse (estimated RA pressure: 8 mmHg).  The aortic root is of normal size.  Normal pericardium with no pericardial effusion.     Echo 09/14/21  Normal left ventricular size.The left ventricular ejection fraction by visual assessment is estimated to be 60-65%.  Trace mitral regurgitation present.  Trace tricuspid regurgitation present.  There is mild aortic stenosis, mean PG 11 mmHg.     Stress Echo 09/15/22  This stress test was normal.      EKG 04/21/23  Sinus tachycardia  Anterior infarct  Marked ST abnormality, possible lateral subendocardial injury     LEFT HEART CATHETERIZATION/PCI OF THE PROXIMAL LAD April 23, 2023:  Successful angioplasty and stenting of the ostial to mid LAD.    Moderate to severe aortic valvular stenosis suggested with a peak to peak aortic valve gradient of 38 mmHg.  Severely reduced LV global systolic function with an EF of 30% and severely elevated LVEDP of 30.       Echo 04/24/23  Normal left ventricular size.   Left ventricular systolic function is normal.   The left ventricular ejection fraction by visual assessment is estimated to be 65-70%.   Left ventricular diastolic parameters are normal.   Normal right ventricular systolic function.   There is moderate aortic stenosis.      Echo 09/11/23   Optison  was used for endocardial border enhancement. Small   left ventricular cavity size.  Mild to moderate concentric   hypertrophy of the left ventricle. Hyperdynamic LV  systolic   function.  Estimated EF of 70-75%.  Normal regional wall motion.     Abnormal diastolic filling pattern for age, Grade I (Impaired     relaxation).     2. Normal right ventricular size. Normal right ventricular global  systolic function.       3. The aortic valve is calcified with restricted leaflet opening.    Trileaflet aortic valve. Moderate aortic stenosis. Peak velocity of  3.2 m/s.  Peak aortic gradient is _42__ mmHg, mean aortic gradient   is __22_ mmHg, aortic valve area by continuity equation is _1.6__   cm2. Dimensionless index of 0.40. No aortic regurgitation noted.          Echo 11/20/23  Normal left ventricular cavity size. Moderate septal hypertrophy,   mild inferolateral wall hypertrophy. Normal global left ventricular   systolic function. EF estimated at 60-65%. Normal regional wall  motion. Grade II diastolic dysfunction, elevated LA filling  pressures.     Normal right ventricular size. Normal right ventricular global     systolic function.      Trace tricuspid regurgitation noted. Unable to estimate RVSP due   to inadequate Doppler signal. IVC diameter is normal with normal   respirophasic motion >50 percent compression with sniff test.      Moderate aortic stenosis - 1.2 cm2, dimensionless index 0.35   (severe<0.25), mean gradient 29 mmHg, peak velocity 3.8 m/sec. No   aortic regurgitation noted.      Dobutamine stress echo 11/20/2023  Grossly negative dobutamine stress echo for ischemia at adequate heart  rate response (reached 91% max predicted heart rate, target is 85%) by  echo and EKG criteria.  Test limited by suboptimal image quality.      The test was terminated due to target heart rate achieved; no chest pain  reported. Normal blood pressure response to dobutamine. Normal LVEF  and wall motion at baseline.       Echo 01/29/2024  Limited for LVEF  Left ventricular systolic function is normal.  The left ventricular ejection fraction by visual assessment is estimated to be 60-65%.  Normal right ventricular systolic function.     EKG 09/11/2024   NSR        Echo 09/11/2024  Normal left ventricular size.  The left ventricular ejection fraction by visual assessment is estimated to be 60-65%.  There is severe aortic stenosis.The mean gradient across the aortic valve is 44.48 mmHg     Telemetry strips:  Sinus    Personally reviewed by Wilda Sharps, APRN, CNP      Problem List:   Principal Problem:    Chest tightness       Assessment:     Akili Cuda is 58 y.o. female with     NSTEMI, Type I  vs Type II  -Troponin peaking at 2349  - left heart catheterization on April 23, 2023 Proximal LAD CAD, s/p DES   -Complaints not consistent with cardiac origin        HFpEF:  - 04/23/23 Cath:    LVEDP of 30 and left ventricular ejection fraction of 30% on LV angiography during catheterization April 23, 2023.  See echo  stunning recovered  - 04/24/23 echo w remarkably recovered LV EF - thus no lifevest at this time  - Echo 09/11/2024 EF 60-65%     Aortic valvular stenosis:   - mild aortic stenosis reported on echo in 04/24/23 and 08/2021 echo  - moderate to severe aortic valve stenosis  suggested with a peak to peak aortic valve gradient of 38 mmhg by catheterization April 23, 2023  -  Echo 09/11/2024 showing severe AS with gradient 44  - Follow[facility] by [facility]     HTN- elevated  HLD: FLP 04/22/23 Total 107 Tri 106 HDL 37 LDL 50  DM- A1C 6.4 on 02/16/22   Renal CA  - s/p left radical nephrectomy 2021   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  - She has a pending appointment with [facility] in regards to her AS.  Upon discussion with patient, we will elect to hold off on any further diagnostic test such as TEE to  assess AS given it will also be performed at [facility] with valve replacement workup  - Patient currently on Toprol  25, losartan  25, and hydralazine  10 mg q.6 p.r.n. for blood pressure control.  We will leave BP management upon the recommendations of Nephrology given end-stage renal  disease.      Thank you for allowing us  to participate in the care of your patient. Please feel free to contact us  if further questions arise.    Wilda Sharps, APRN, CNP   [facility]     I agree with the assessment and plan by Wilda Sharps C-NP.  I was an integral part of the development of the assessment and plan  Leita Chancy, MD [facility]  [facility]          I independently of the faculty provider spent a total of (30) minutes in direct care of this patient including initial evaluation, review of laboratory, radiology, diagnostic studies, review of medical record, order entry and coordination of care.    Portions of this note may be dictated using voice recognition software or a dictation service. Variances in spelling and vocabulary are possible and unintentional. Not all errors are caught/corrected. Please notify the dino if any discrepancies are noted, or if the meaning of any statement is not clear.       This note was created with assistance from [employer] via capture of conversational audio. Consent was obtained from the patient and all parties present prior to recording.

## 2024-09-12 NOTE — Discharge Summary (Incomplete)
 [facility]    DISCHARGE SUMMARY      PATIENT NAME:  [name], May  MRN:  [id number]  DOB:  04-04-1966    ENCOUNTER START DATE:  09/10/2024  INPATIENT ADMISSION DATE: 09/11/2024  DISCHARGE DATE:  09/12/2024    ATTENDING PHYSICIAN: Dr. Brendan  PRIMARY CARE PHYSICIAN: Erminio Cobble, NP     ADMISSION DIAGNOSIS: Chest tightness  Chief Complaint   Patient presents with    Anxiety    Shortness of Breath     Pt presents to ED via ems for increased anxiety and SOB related to setting up at home dialysis. Pt history of kidney disease, DM. C/o chest tightness that started to left chest and is now presenting mid sternal.       DISCHARGE DIAGNOSIS:     Principal Problem:  Chest tightness    Active Hospital Problems    Diagnosis Date Noted    Principal Problem: Chest tightness [R07.89] 09/10/2024      Resolved Hospital Problems   No resolved problems to display.     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 difficile diarrhea 12/02/2014    Uncontrolled hypertension 11/30/2014    Obesity 11/30/2014    Cellulitis of foot, left 11/29/2014    Type 2 diabetes mellitus 10/25/2014    Diabetic foot infection (CMS HCC)  (CMS HCC) 10/24/2014      Patient Active Problem List   Diagnosis Code    Diabetic foot infection (CMS HCC)  (CMS HCC) E11.628, L08.9    Type 2 diabetes mellitus E11.9    Cellulitis of foot,  left L03.116    Uncontrolled hypertension I10    Obesity E66.9    C. 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    Morbid obesity E66.01    Persistent vomiting R11.15    CKD (chronic kidney disease), stage V (CMS HCC) N18.5    Fluid overload E87.70    History of kidney disease Z87.448    Cancer (CMS HCC) C80.1    Elevated troponin I level R79.89    Volume overload E87.70    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) N18.6, Z99.2    NSTEMI (non-ST elevated myocardial infarction) (CMS HCC) I21.4    Hypercholesterolemia E78.00    Chest pain R07.9    Thyroid  nodule E04.1    Chest tightness R07.89       Allergies[1]         DISCHARGE MEDICATIONS:     Current Discharge Medication List        CONTINUE these medications which have CHANGED during your visit.        Details   atorvastatin  80 mg  Tablet  Commonly known as: LIPITOR  What changed:   medication strength  how much to take   80 mg, Oral, EVERY EVENING  Qty: 30 Tablet  Refills: 0     metoprolol  succinate 25 mg Tablet Sustained Release 24 hr  Commonly known as: TOPROL -XL  What changed: how much to take   50 mg, Oral, Daily  Qty: 60 Tablet  Refills: 0     sevelamer  carbonate 800 mg Tablet  Commonly known as: RENVELA   What changed: when to take this   1,600 mg, Oral, 3 TIMES DAILY WITH MEALS  Refills: 0            CONTINUE these medications - NO CHANGES were made during your visit.        Details   albuterol  sulfate 90 mcg/actuation oral inhaler  Commonly known as: PROVENTIL  or VENTOLIN  or PROAIR    1-2 Puffs, Inhalation, EVERY 6 HOURS PRN  Qty: 1 Each  Refills: 0     ALPRAZolam  0.25 mg Tablet  Commonly known as: XANAX    0.25 mg, DAILY PRN  Refills: 0     aspirin  81 mg Tablet, Chewable   81 mg, Daily  Refills: 0     cyclobenzaprine  5 mg Tablet  Commonly known as: FLEXERIL    5 mg, 3 TIMES DAILY PRN  Refills: 0     escitalopram  oxalate 10 mg Tablet  Commonly known as: LEXAPRO    10 mg,  NIGHTLY  Refills: 0     fluticasone  propionate 50 mcg/actuation Spray, Suspension  Commonly known as: FLONASE    1 Spray, DAILY PRN  Refills: 0     iron sucrose 50 mg iron/2.5 mL Solution  Commonly known as: VENOFER   50 mg  Refills: 0     loperamide  2 mg Capsule  Commonly known as: IMODIUM    4 mg, EVERY 6 HOURS PRN  Refills: 0     loratadine  10 mg Tablet  Commonly known as: CLARITIN    10 mg, DAILY PRN  Refills: 0     losartan  25 mg Tablet  Commonly known as: COZAAR    25 mg, Oral, Daily  Qty: 30 Tablet  Refills: 0     Mounjaro 7.5 mg/0.5 mL Pen Injector  Generic drug: tirzepatide   7.5 mg, EVERY 7 DAYS  Refills: 0     nitroGLYCERIN  0.4 mg Tablet, Sublingual  Commonly known as: NITROSTAT    0.4 mg, Sublingual, EVERY 5 MIN PRN, for 3 doses over 15 minutes  Qty: 10 Tablet  Refills: 0     Triphrocaps 1 mg Capsule  Generic drug: B complex-vitamin C -folic acid    1 Capsule, NIGHTLY  Refills: 0     vitamin E 100 unit Capsule   100 Units, 2 TIMES DAILY  Refills: 0            STOP taking these medications.      carvedilol  3.125 mg Tablet  Commonly known as: COREG               DISCHARGE INSTRUCTIONS:      ASPIRIN  ALREADY ORDERED      Follow-up Information       Julius America, NP Follow up in 3 day(s).    Specialty: FAMILY NURSE PRACTITIONER  Contact information:  [address]  [address]  [place] [address]  [phone]                             REASON FOR  HOSPITALIZATION AND HOSPITAL COURSE:  This is a 58 y.o., female ***  Stable for discharge.  Total time spent 45 mins.     AVS reviewed with patient/care giver.  A written copy of the AVS and discharge instructions given to the patient/care giver. Patient/Family was in agreement, endorsed understanding, and all questions were answered.     SIGNIFICANT PHYSICAL FINDINGS: ***  SIGNIFICANT LAB: ***  SIGNIFICANT RADIOLOGY: ***  CONSULTATIONS: ***  PROCEDURES PERFORMED: none    Isolation Status:  Contact II    COURSE IN HOSPITAL: as above    DOES PATIENT HAVE ADVANCED  DIRECTIVES:  Yes    ADVANCED CARE PLANNING - {POSTFORMDOC:[id number]}    CONDITION ON DISCHARGE: Stable    DISCHARGE DISPOSITION:  {DISPOSITION PLANNING:[id number]::Home discharge }    Copies sent to Care Team         Relationship Specialty Notifications Start End    Julius America, NP PCP - General FAMILY NURSE PRACTITIONER  01/28/24     Phone: [phone] Fax: [phone]         [address] [place] [address]                          [1]   Allergies  Allergen Reactions    Gabapentin Itching    Hydrocodone      Ibuprofen      Vicodin [Hydrocodone -Acetaminophen ] Itching

## 2024-09-12 NOTE — Progress Notes (Signed)
 [facility]  Nephrology Consult Follow Up Note      Date of service: 09/12/2024  Hospital Day:  LOS: 1 day   Encounter Start Date: 09/10/2024  Inpatient Admission Date: 09/11/2024    SUBJECTIVE: Tolerating PD OK except for some inflow discomfort. Reviewed Cardiology note indicating critical aortic stenosis,, upcoming appointment at [facility] and their deferring to me for medication adjustment.     OBJECTIVE:    Vital Signs:  Temp (24hrs) Max:37.3 C (99.1 F)      Temperature: 36.9 C (98.4 F)  BP (Non-Invasive): 123/81  MAP (Non-Invasive): 94 mmHG  Heart Rate: 87  Respiratory Rate: 19  SpO2: 100 %    Systolic (24hrs), Avg:141 , Min:114 , Max:159     Diastolic (24hrs), Avg:86, Min:59, Max:98    MAP:  MAP (Non-Invasive)  Avg: 104.9 mmHG  Min: 76 mmHG  Max: 121 mmHG  Heart Rate:  Pulse  Avg: 84.1  Min: 76  Max: 94    Dialysis treatment total fluid removal:     Post dialysis weight:       Diet Order:  DIET RENAL (75GM PROT;2GM K: 2GM NA) Do you want to initiate MNT Protocol? Yes; Modifications/limitations: 2 G SODIUM  Nutrition:    Orders Placed This Encounter   No orders of the following type(s) were placed in this encounter: Nourishments.     IV Fluids, Meds, and Drips: heparin , Last Rate: Stopped (09/12/24 0926)        I/O:  I/O last 24 hours:    Intake/Output Summary (Last 24 hours) at 09/12/2024 1051  Last data filed at 09/12/2024 0926  Gross per 24 hour   Intake 600 ml   Output -250 ml   Net 850 ml     I/O current shift:  12/19 0700 - 12/19 1859  In: -   Out: -200   I/O last 3 completed shifts:  In: 600 [P.O.:600]  Out: -2550     Labs:  I have reviewed all lab results.    Imaging:  N/A    Current Medications:  2.5% dextrose  (low calcium ) peritoneal dialysis PREMIX solution - NO additives, 2 L, Peritoneal catheter, Q6HRS  acetaminophen  (TYLENOL ) tablet, 650 mg, Oral, Q6H PRN  albuterol  (PROVENTIL ) 2.5 mg / 3 mL (0.083%) neb solution, 2.5 mg, Nebulization, Q4H PRN  ALPRAZolam  (XANAX )  tablet, 0.25 mg, Oral, Daily PRN  aspirin  chewable tablet 81 mg, 81 mg, Oral, Daily  atorvastatin  (LIPITOR) tablet, 80 mg, Oral, QPM  busPIRone  (BUSPAR ) tablet, 5 mg, Oral, 2x/day  Correction/SSIP insulin  lispro 100 units/mL injection pen, 1-5 Units, Subcutaneous, 4x/day AC  D5W 250 mL flush bag, , Intravenous, Q15 Min PRN  dextrose  (GLUTOSE) 40% oral gel, 15 g, Oral, Q15 Min PRN  dextrose  50% (0.5 g/mL) injection - syringe, 12.5 g, Intravenous, Q15 Min PRN  escitalopram  (LEXAPRO ) tablet, 10 mg, Oral, NIGHTLY  glucagon  injection 1 mg, 1 mg, IntraMUSCULAR, Once PRN  heparin  25,000 units in 1/2 NS 250 mL infusion, 11.8 Units/kg/hr (Adjusted), Intravenous, Continuous  hydrALAZINE  (APRESOLINE ) injection 10 mg, 10 mg, Intravenous, Q6H PRN  losartan  (COZAAR ) tablet, 25 mg, Oral, Daily  metoprolol  succinate (TOPROL -XL) 24 hr extended release tablet, 50 mg, Oral, Daily  nitroGLYCERIN  (NITROSTAT ) sublingual tablet, 0.4 mg, Sublingual, Q5 Min PRN  NS 250 mL flush bag, , Intravenous, Q15 Min PRN  NS flush syringe, 10 mL, Intravenous, Q8H  NS flush syringe, 10 mL, Intravenous, Q8HRS  NS flush syringe, 10 mL, Intravenous, Q1H PRN  nystatin  (  NYSTOP ) 100,000 units/g topical powder, , Apply Topically, 2x/day  ondansetron  (ZOFRAN ) 2 mg/mL injection, 4 mg, Intravenous, Q8H PRN  sevelamer  carbonate (RENVELA ) tablet, 1,600 mg, Oral, 3x/day-Meals        Physical Exam:  Constitutional:  appears in good health, comfortable  Respiratory:  Breathing nonlabored  Integumentary:  Skin warm and dry  Neurologic:  No asterixis    Impression/Recommendations:  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   HTN  CAD  Plan:  I Increased Toprol  XL to 50 mg daily  I increased Atorvastatin  to 80 mg every evening.   Please discharge patient today  Discussed her PD plans at discharge - patient will contact PC clinic  Patient has appointment for [facility] in January where they will probably make plans for TAVR aortic valve which  in my opinion should happen as soon as possible.     Deward Ford, MD

## 2024-09-12 NOTE — Care Management Notes (Signed)
DC order noted. Last IMM was completed <48 hrs ago.

## 2024-09-12 NOTE — Care Plan (Signed)
 Pt doing well. Slept for most of the night. Did PD twice during shift. PTT came back not therapeutic. Heparin  dose changed and new PTT put in. No other concerns.

## 2024-09-12 NOTE — Nurses Notes (Signed)

## 2024-09-29 NOTE — Discharge Summary (Signed)
 [facility]    DISCHARGE SUMMARY      PATIENT NAME:  [name], [name]  MRN:  [id number]  DOB:  20-Mar-1966    ENCOUNTER START DATE:  09/10/2024  INPATIENT ADMISSION DATE: 09/11/2024  DISCHARGE DATE:  09/12/2024    ATTENDING PHYSICIAN: Dr. Brendan  PRIMARY CARE PHYSICIAN: Erminio Cobble, NP     ADMISSION DIAGNOSIS: Chest tightness  Chief Complaint   Patient presents with    Anxiety    Shortness of Breath     Pt presents to ED via ems for increased anxiety and SOB related to setting up at home dialysis. Pt history of kidney disease, DM. C/o chest tightness that started to left chest and is now presenting mid sternal.       DISCHARGE DIAGNOSIS:     Principal Problem:  Chest tightness    Active Hospital Problems    Diagnosis Date Noted    Principal Problem: Chest tightness [R07.89] 09/10/2024      Resolved Hospital Problems   No resolved problems to display.     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 difficile diarrhea 12/02/2014    Uncontrolled hypertension 11/30/2014    Obesity 11/30/2014    Cellulitis of foot, left 11/29/2014    Type 2 diabetes mellitus 10/25/2014    Diabetic foot infection (CMS HCC)  (CMS HCC) 10/24/2014      Patient Active Problem List   Diagnosis Code    Diabetic foot infection (CMS HCC)  (CMS HCC) E11.628, L08.9    Type 2 diabetes mellitus E11.9    Cellulitis of foot,  left L03.116    Uncontrolled hypertension I10    Obesity E66.9    C. 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    Morbid obesity E66.01    Persistent vomiting R11.15    CKD (chronic kidney disease), stage V (CMS HCC) N18.5    Fluid overload E87.70    History of kidney disease Z87.448    Cancer (CMS HCC) C80.1    Elevated troponin I level R79.89    Volume overload E87.70    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) N18.6, Z99.2    NSTEMI (non-ST elevated myocardial infarction) (CMS HCC) I21.4    Hypercholesterolemia E78.00    Chest pain R07.9    Thyroid  nodule E04.1    Chest tightness R07.89       Allergies[1]         DISCHARGE MEDICATIONS:     Current Discharge Medication List        CONTINUE these medications which have CHANGED during your visit.        Details   atorvastatin  80 mg  Tablet  Commonly known as: LIPITOR  What changed:   medication strength  how much to take   80 mg, Oral, EVERY EVENING  Qty: 30 Tablet  Refills: 0     metoprolol  succinate 25 mg Tablet Sustained Release 24 hr  Commonly known as: TOPROL -XL  What changed: how much to take   50 mg, Oral, Daily  Qty: 60 Tablet  Refills: 0     sevelamer  carbonate 800 mg Tablet  Commonly known as: RENVELA   What changed: when to take this   1,600 mg, Oral, 3 TIMES DAILY WITH MEALS  Refills: 0            CONTINUE these medications - NO CHANGES were made during your visit.        Details   albuterol  sulfate 90 mcg/actuation oral inhaler  Commonly known as: PROVENTIL  or VENTOLIN  or PROAIR    1-2 Puffs, Inhalation, EVERY 6 HOURS PRN  Qty: 1 Each  Refills: 0     ALPRAZolam  0.25 mg Tablet  Commonly known as: XANAX    0.25 mg, DAILY PRN  Refills: 0     aspirin  81 mg Tablet, Chewable   81 mg, Daily  Refills: 0     cyclobenzaprine  5 mg Tablet  Commonly known as: FLEXERIL    5 mg, 3 TIMES DAILY PRN  Refills: 0     escitalopram  oxalate 10 mg Tablet  Commonly known as: LEXAPRO    10 mg,  NIGHTLY  Refills: 0     fluticasone  propionate 50 mcg/actuation Spray, Suspension  Commonly known as: FLONASE    1 Spray, DAILY PRN  Refills: 0     iron sucrose 50 mg iron/2.5 mL Solution  Commonly known as: VENOFER   50 mg  Refills: 0     loperamide  2 mg Capsule  Commonly known as: IMODIUM    4 mg, EVERY 6 HOURS PRN  Refills: 0     loratadine  10 mg Tablet  Commonly known as: CLARITIN    10 mg, DAILY PRN  Refills: 0     losartan  25 mg Tablet  Commonly known as: COZAAR    25 mg, Oral, Daily  Qty: 30 Tablet  Refills: 0     Mounjaro 7.5 mg/0.5 mL Pen Injector  Generic drug: tirzepatide   7.5 mg, EVERY 7 DAYS  Refills: 0     nitroGLYCERIN  0.4 mg Tablet, Sublingual  Commonly known as: NITROSTAT    0.4 mg, Sublingual, EVERY 5 MIN PRN, for 3 doses over 15 minutes  Qty: 10 Tablet  Refills: 0     Triphrocaps 1 mg Capsule  Generic drug: B complex-vitamin C -folic acid    1 Capsule, NIGHTLY  Refills: 0     vitamin E 100 unit Capsule   100 Units, 2 TIMES DAILY  Refills: 0            STOP taking these medications.      carvedilol  3.125 mg Tablet  Commonly known as: COREG               DISCHARGE INSTRUCTIONS:      ASPIRIN  ALREADY ORDERED      Follow-up Information       Julius America, NP Follow up on 09/16/2024.    Specialty: FAMILY NURSE PRACTITIONER  Why: Hospital follow-up appointment scheduled for Tuesday, September 16, 2024   at 10:00am. Please arrive by 9:45am.  Contact information:  [address]  [place] [address]  [phone]  REASON FOR HOSPITALIZATION AND HOSPITAL COURSE:  This is a 59 y.o., female admitted with CP. Trop trended up. Seen by cardiology- NSTEMI type2. She has a pending appointment with [facility] in regards to her AS. Upon discussion with patient, we  elected  to hold off on any further diagnostic test such as TEE to assess AS given it will also be performed at [facility] with valve replacement workup. 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   Tolerating PD OK except for  some inflow discomfort.    Increased Toprol  XL to 50 mg daily   increased Atorvastatin  to 80 mg every evening.   Stable for discharge.  Total time spent 45 mins.         AVS reviewed with patient/care giver.  A written copy of the AVS and discharge instructions given to the patient/care giver. Patient/Family was in agreement, endorsed understanding, and all questions were answered.     SIGNIFICANT PHYSICAL FINDINGS:   General: AAO x3 ; NAD   Eyes: Conjunctiva and sclera clear. Bilateral EOM intact.   HENT: Normocephalic, atraumatic.   Cardiovascular: Regular rate and rhythm. Normal S1, S2. No murmurs, rubs, or gallops.   Respiratory: Clear to auscultation bilaterally. No rales, rhonchi, or wheezes. No use of accessory muscles. Normal work of breathing.   Gastrointestinal:  Soft,  nondistended, non-tender PD cath  Musculoskeletal: No obvious deformity  Neurological: AAO x 3. No focal deficits. Moving all extremities and following commands.    Integumentary/Skin: No erythema or rash.  Psychiatric: Cooperative with exam. Not agitated. Normal affect.     PROCEDURES PERFORMED: none        COURSE IN HOSPITAL: as above    DOES PATIENT HAVE ADVANCED DIRECTIVES:  Yes    ADVANCED CARE PLANNING - Not applicable for this patient    CONDITION ON DISCHARGE: Stable    DISCHARGE DISPOSITION:  Home discharge     Copies sent to Care Team         Relationship Specialty Notifications Start End    Julius America, NP PCP - General FAMILY NURSE PRACTITIONER  01/28/24     Phone: [phone] Fax: [phone]         [address] [place] [address]                          [1]   Allergies  Allergen Reactions    Gabapentin Itching    Hydrocodone      Ibuprofen      Vicodin [Hydrocodone -Acetaminophen ] Itching
